# Patient Record
Sex: Female | Born: 1955 | Race: White | Hispanic: Refuse to answer | Marital: Married
Health system: Southern US, Community
[De-identification: ages and names within clinical notes are randomized; demographics above are authoritative.]

## PROBLEM LIST (undated history)

## (undated) DIAGNOSIS — D649 Anemia, unspecified: Secondary | ICD-10-CM

## (undated) DIAGNOSIS — E079 Disorder of thyroid, unspecified: Secondary | ICD-10-CM

## (undated) DIAGNOSIS — F32A Depression, unspecified: Secondary | ICD-10-CM

## (undated) DIAGNOSIS — K635 Polyp of colon: Secondary | ICD-10-CM

## (undated) DIAGNOSIS — G4733 Obstructive sleep apnea (adult) (pediatric): Secondary | ICD-10-CM

## (undated) DIAGNOSIS — G709 Myoneural disorder, unspecified: Secondary | ICD-10-CM

## (undated) DIAGNOSIS — S83209A Unspecified tear of unspecified meniscus, current injury, unspecified knee, initial encounter: Secondary | ICD-10-CM

## (undated) DIAGNOSIS — Z96659 Presence of unspecified artificial knee joint: Secondary | ICD-10-CM

## (undated) DIAGNOSIS — Z96651 Presence of right artificial knee joint: Secondary | ICD-10-CM

## (undated) DIAGNOSIS — K219 Gastro-esophageal reflux disease without esophagitis: Secondary | ICD-10-CM

## (undated) DIAGNOSIS — F329 Major depressive disorder, single episode, unspecified: Secondary | ICD-10-CM

## (undated) DIAGNOSIS — F419 Anxiety disorder, unspecified: Secondary | ICD-10-CM

## (undated) DIAGNOSIS — R0600 Dyspnea, unspecified: Secondary | ICD-10-CM

## (undated) DIAGNOSIS — Z87440 Personal history of urinary (tract) infections: Secondary | ICD-10-CM

## (undated) DIAGNOSIS — F1090 Alcohol use, unspecified, uncomplicated: Secondary | ICD-10-CM

## (undated) DIAGNOSIS — K769 Liver disease, unspecified: Secondary | ICD-10-CM

## (undated) DIAGNOSIS — R011 Cardiac murmur, unspecified: Secondary | ICD-10-CM

## (undated) DIAGNOSIS — I10 Essential (primary) hypertension: Secondary | ICD-10-CM

## (undated) DIAGNOSIS — E039 Hypothyroidism, unspecified: Secondary | ICD-10-CM

## (undated) DIAGNOSIS — M1990 Unspecified osteoarthritis, unspecified site: Secondary | ICD-10-CM

## (undated) DIAGNOSIS — R0602 Shortness of breath: Secondary | ICD-10-CM

## (undated) DIAGNOSIS — E785 Hyperlipidemia, unspecified: Secondary | ICD-10-CM

## (undated) DIAGNOSIS — Z7289 Other problems related to lifestyle: Secondary | ICD-10-CM

## (undated) DIAGNOSIS — E119 Type 2 diabetes mellitus without complications: Secondary | ICD-10-CM

## (undated) HISTORY — DX: Presence of right artificial knee joint: Z96.651

## (undated) HISTORY — DX: Depression, unspecified: F32.A

## (undated) HISTORY — DX: Other problems related to lifestyle: Z72.89

## (undated) HISTORY — DX: Hyperlipidemia, unspecified: E78.5

## (undated) HISTORY — DX: Unspecified tear of unspecified meniscus, current injury, unspecified knee, initial encounter: S83.209A

## (undated) HISTORY — DX: Liver disease, unspecified: K76.9

## (undated) HISTORY — DX: Disorder of thyroid, unspecified: E07.9

## (undated) HISTORY — DX: Type 2 diabetes mellitus without complications: E11.9

## (undated) HISTORY — PX: INNER EAR SURGERY: SHX679

## (undated) HISTORY — DX: Anemia, unspecified: D64.9

## (undated) HISTORY — PX: HAMMER TOE SURGERY: SHX385

## (undated) HISTORY — DX: Unspecified osteoarthritis, unspecified site: M19.90

## (undated) HISTORY — DX: Gastro-esophageal reflux disease without esophagitis: K21.9

## (undated) HISTORY — DX: Alcohol use, unspecified, uncomplicated: F10.90

## (undated) HISTORY — DX: Presence of unspecified artificial knee joint: Z96.659

## (undated) HISTORY — PX: CHOLECYSTECTOMY: SHX55

## (undated) HISTORY — DX: Major depressive disorder, single episode, unspecified: F32.9

## (undated) HISTORY — PX: ADENOIDECTOMY: SUR15

## (undated) HISTORY — DX: Personal history of urinary (tract) infections: Z87.440

## (undated) HISTORY — DX: Polyp of colon: K63.5

## (undated) HISTORY — PX: OTHER SURGICAL HISTORY: SHX169

## (undated) HISTORY — PX: TONSILLECTOMY: SUR1361

## (undated) HISTORY — DX: Obstructive sleep apnea (adult) (pediatric): G47.33

## (undated) HISTORY — DX: Essential (primary) hypertension: I10

## (undated) HISTORY — PX: BREAST SURGERY: SHX581

---

## 1985-02-21 HISTORY — PX: TUBAL LIGATION: SHX77

## 2007-02-22 HISTORY — PX: TOTAL KNEE ARTHROPLASTY: SHX125

## 2007-03-05 ENCOUNTER — Encounter: Payer: Self-pay | Admitting: Cardiovascular Disease

## 2007-04-24 ENCOUNTER — Encounter: Payer: Self-pay | Admitting: Cardiovascular Disease

## 2009-12-22 DIAGNOSIS — E119 Type 2 diabetes mellitus without complications: Secondary | ICD-10-CM

## 2009-12-22 HISTORY — DX: Type 2 diabetes mellitus without complications: E11.9

## 2009-12-22 LAB — HM PAP SMEAR

## 2010-01-05 ENCOUNTER — Encounter: Payer: Self-pay | Admitting: Cardiovascular Disease

## 2010-01-28 ENCOUNTER — Encounter: Payer: Self-pay | Admitting: Cardiovascular Disease

## 2010-04-06 ENCOUNTER — Encounter: Payer: Self-pay | Admitting: Gastroenterology

## 2010-04-12 ENCOUNTER — Other Ambulatory Visit: Payer: PRIVATE HEALTH INSURANCE

## 2010-04-12 ENCOUNTER — Encounter (INDEPENDENT_AMBULATORY_CARE_PROVIDER_SITE_OTHER): Payer: Self-pay | Admitting: *Deleted

## 2010-04-12 ENCOUNTER — Other Ambulatory Visit: Payer: Self-pay | Admitting: Gastroenterology

## 2010-04-12 ENCOUNTER — Ambulatory Visit (INDEPENDENT_AMBULATORY_CARE_PROVIDER_SITE_OTHER): Payer: PRIVATE HEALTH INSURANCE | Admitting: Gastroenterology

## 2010-04-12 ENCOUNTER — Encounter: Payer: Self-pay | Admitting: Gastroenterology

## 2010-04-12 DIAGNOSIS — F1021 Alcohol dependence, in remission: Secondary | ICD-10-CM | POA: Insufficient documentation

## 2010-04-12 DIAGNOSIS — R1013 Epigastric pain: Secondary | ICD-10-CM

## 2010-04-12 DIAGNOSIS — Z86010 Personal history of colonic polyps: Secondary | ICD-10-CM | POA: Insufficient documentation

## 2010-04-12 LAB — CBC WITH DIFFERENTIAL/PLATELET
Basophils Absolute: 0 10*3/uL (ref 0.0–0.1)
Basophils Relative: 0.4 % (ref 0.0–3.0)
Eosinophils Absolute: 0.1 10*3/uL (ref 0.0–0.7)
Eosinophils Relative: 0.9 % (ref 0.0–5.0)
HCT: 41.3 % (ref 36.0–46.0)
Hemoglobin: 14.1 g/dL (ref 12.0–15.0)
Lymphocytes Relative: 21 % (ref 12.0–46.0)
Lymphs Abs: 1.7 10*3/uL (ref 0.7–4.0)
MCHC: 34.3 g/dL (ref 30.0–36.0)
MCV: 103.3 fl — ABNORMAL HIGH (ref 78.0–100.0)
Monocytes Absolute: 0.4 10*3/uL (ref 0.1–1.0)
Monocytes Relative: 5 % (ref 3.0–12.0)
Neutro Abs: 6 10*3/uL (ref 1.4–7.7)
Neutrophils Relative %: 72.7 % (ref 43.0–77.0)
Platelets: 313 10*3/uL (ref 150.0–400.0)
RBC: 3.99 Mil/uL (ref 3.87–5.11)
RDW: 15.9 % — ABNORMAL HIGH (ref 11.5–14.6)
WBC: 8.2 10*3/uL (ref 4.5–10.5)

## 2010-04-12 LAB — COMPREHENSIVE METABOLIC PANEL
ALT: 45 U/L — ABNORMAL HIGH (ref 0–35)
AST: 68 U/L — ABNORMAL HIGH (ref 0–37)
Albumin: 3.6 g/dL (ref 3.5–5.2)
Alkaline Phosphatase: 86 U/L (ref 39–117)
BUN: 8 mg/dL (ref 6–23)
CO2: 31 mEq/L (ref 19–32)
Calcium: 9.9 mg/dL (ref 8.4–10.5)
Chloride: 100 mEq/L (ref 96–112)
Creatinine, Ser: 0.9 mg/dL (ref 0.4–1.2)
GFR: 70.73 mL/min (ref >60.00)
Glucose, Bld: 129 mg/dL — ABNORMAL HIGH (ref 70–99)
Potassium: 5.5 mEq/L — ABNORMAL HIGH (ref 3.5–5.1)
Sodium: 139 mEq/L (ref 135–145)
Total Bilirubin: 0.6 mg/dL (ref 0.3–1.2)
Total Protein: 6.8 g/dL (ref 6.0–8.3)

## 2010-04-12 LAB — PROTIME-INR
INR: 1.2 ratio — ABNORMAL HIGH (ref 0.8–1.0)
Prothrombin Time: 13 s — ABNORMAL HIGH (ref 10.2–12.4)

## 2010-04-16 ENCOUNTER — Telehealth: Payer: Self-pay | Admitting: Gastroenterology

## 2010-04-19 ENCOUNTER — Encounter (INDEPENDENT_AMBULATORY_CARE_PROVIDER_SITE_OTHER): Payer: Self-pay | Admitting: *Deleted

## 2010-04-19 ENCOUNTER — Encounter: Payer: Self-pay | Admitting: Gastroenterology

## 2010-04-20 ENCOUNTER — Ambulatory Visit (HOSPITAL_COMMUNITY)
Admission: RE | Admit: 2010-04-20 | Discharge: 2010-04-20 | Disposition: A | Discharge status: Home / Self Care | Payer: PRIVATE HEALTH INSURANCE | Source: Ambulatory Visit | Attending: Gastroenterology | Admitting: Gastroenterology

## 2010-04-20 DIAGNOSIS — K7689 Other specified diseases of liver: Secondary | ICD-10-CM | POA: Insufficient documentation

## 2010-04-20 DIAGNOSIS — Q638 Other specified congenital malformations of kidney: Secondary | ICD-10-CM | POA: Insufficient documentation

## 2010-04-20 DIAGNOSIS — R1013 Epigastric pain: Secondary | ICD-10-CM | POA: Insufficient documentation

## 2010-04-20 DIAGNOSIS — Z9089 Acquired absence of other organs: Secondary | ICD-10-CM | POA: Insufficient documentation

## 2010-04-20 NOTE — Letter (Signed)
Summary: Lenoard Aden MD/[facility]  Lenoard Aden MD/[facility]   Imported By: Lester [place] 04/15/2010 09:06:31  _____________________________________________________________________  External Attachment:    Type:   Image     Comment:   External Document

## 2010-04-20 NOTE — Assessment & Plan Note (Signed)
Summary: ABD PAIN..JJ. [facility] W PT//CX POL ADVISED//GI HX YEARS AGO IN [place]...   History of Present Illness Visit Type: Initial Visit Primary GI MD: Melvia Heaps MD [facility] Primary Provider: n/a Requesting Provider: Olivia Mackie, MD Chief Complaint: abdominal pain x 1 month History of Present Illness:   [name] [name]  is a 55 year old white female with history of alcoholism, colon polyps, diabetes and depression referred at the request of Dr. Billy Coast  for evaluation of abdominal pain.  For several months she has been complaining of sharp pain, both in the right and left lower quadrants. Pain may last minutes to up to 10 minutes at a time. It is unrelated to eating or moving her bowels. it is also not positional. There is no exacerbating or ameliorating factors. She also complains of midepigastric pain. This pain tends to be independent of eating.  She takes  Prevacid daily. She denies pyrosis , nausea or dysphagia. She has a history of alcohol abuse and has been in rehabilitation in the past. She admits to drinking 3 drinks daily.  She takes ibuprofen several times a week if she has back pain.   Recent vaginal ultrasound was normal.   GI Review of Systems    Reports abdominal pain, acid reflux, bloating, and  heartburn.     Location of  Abdominal pain: RLQ.    Denies belching, chest pain, dysphagia with liquids, dysphagia with solids, loss of appetite, nausea, vomiting, vomiting blood, weight loss, and  weight gain.        Denies anal fissure, black tarry stools, change in bowel habit, constipation, diarrhea, diverticulosis, fecal incontinence, heme positive stool, hemorrhoids, irritable bowel syndrome, jaundice, light color stool, liver problems, rectal bleeding, and  rectal pain. Preventive Screening-Counseling & Management  Alcohol-Tobacco     Smoking Status: quit  Caffeine-Diet-Exercise     Does Patient Exercise: no      Drug Use:  no.      Current Medications (verified): 1)  Prevacid 30  Mg Cpdr (Lansoprazole) .... Take 1 Cap By Mouth Once Daily 2)  Synthroid 125 Mcg Tabs (Levothyroxine Sodium) .... Take 1 Tablet By Mouth Once Daily 3)  Simvastatin 20 Mg Tabs (Simvastatin) .... Take 1 Tablet By Mouth Once Daily 4)  Citalopram Hydrobromide 20 Mg Tabs (Citalopram Hydrobromide) .... Take 1 Tablet By Mouth Once Daily 5)  Glipizide 5 Mg Tabs (Glipizide) .... Take 1 Tablet By Mouth Once Daily 6)  Lotrel 5-10 Mg Caps (Amlodipine Besy-Benazepril Hcl) .... Take 1 Cap By Mouth Once Daily 7)  Metformin Hcl 500 Mg Tabs (Metformin Hcl) .... Take 3 Tablets By Mouth At Bedtime 8)  Vitamin D3 2500iu .... Once Daily 9)  Vitamin B-1 100 Mg Tabs (Thiamine Hcl) .... Two Times A Day 10)  Ibuprofen 800 Mg Tabs (Ibuprofen) .... As Needed  Allergies (verified): 1)  ! Adhesive Tape  Past History:  Past Medical History: PTSD Alcoholism Anemia Arthritis Adenomatous Colon Polyps Depression Diabetes Gallstones Hyperlipidemia Hypertension Hypothyroidism Obesity Urinary Tract Infection  Past Surgical History: Cholecystectomy C- section Tonsillectomy Tubal Ligation Knee Arthroscopy Knee Replacement Ankle surgery  Family History: Patient adopted   Social History: Occupation: Retired Patient is a former smoker.  Alcohol Use - yes Daily Caffeine Use Illicit Drug Use - no Patient does not get regular exercise.  Smoking Status:  quit Drug Use:  no Does Patient Exercise:  no  Review of Systems       The patient complains of arthritis/joint pain, back pain, and urination changes/pain.  The patient denies anemia, anxiety-new, blood in urine, breast changes/lumps, change in vision, confusion, cough, coughing up blood, depression-new, fainting, fatigue, fever, headaches-new, hearing problems, heart murmur, heart rhythm changes, itching, menstrual pain, muscle pains/cramps, night sweats, nosebleeds, pregnancy symptoms, shortness of breath, skin rash, sleeping problems, sore throat,  swelling of feet/legs, swollen lymph glands, thirst - excessive, urination - excessive, urine leakage, vision changes, and voice change.         All other systems were reviewed and were negative   Vital Signs:  Patient profile:   55 year old female Height:      66 inches Weight:      245.25 pounds BMI:     39.73 Pulse rate:   96 / minute Pulse rhythm:   regular BP sitting:   130 / 70  (left arm) Cuff size:   regular  Vitals Entered By: June McMurray CMA Duncan Dull) (April 12, 2010 3:36 PM)  Physical Exam  Additional Exam:   On physical exam, she is an obese female.  Physical Exam: General:   WDWN HEENT:   anicteric.  No pharyngeal abnormalities Neck:   No masses, thyroidmegaly Nodes:   No cervical, axillary, inguinal adenopathy Chest:    Clear to auscultation Cardiac:   No murmurs, gallops, rubs Abdomen:   BS active.  No abd masses, tenderness;  Liver is palpable 3 finger breaths below the right costal margin and percusses to 12 cm Rectal:   Deferred Extremities:   No cyanosis, clubbing, edema Skeletal:   No deformities Neuro:   Alert, oriented x3.  No focal abnormalities     Impression & Recommendations:  Problem # 1:  EPIGASTRIC PAIN (ICD-789.06)  Pain could be due to ulcer or non ulcer dyspepsia.  Alcohol this may be an  irritating factor.  Recommendations  #1 followup hyomax 0.375 mg twice a day , while continuing  prevacid #2 upper endoscopy, if she's not improved with the addition of hyomax Orders: TLB-CBC Platelet - w/Differential (85025-CBCD) TLB-CMP (Comprehensive Metabolic Pnl) (80053-COMP) TLB-PT (Protime) (85610-PTP) Ultrasound Abdomen (UAS)  Problem # 2:  PERSONAL HISTORY OF ALCOHOLISM (ICD-V11.3)  The patient has hepatomegaly and I suspect alcohol related liver disease.   Recommendations #1 abdominal ultrasound. #2 check LFTs  Problem # 3:  PERSONAL HISTORY OF COLONIC POLYPS (ICD-V12.72)  Last colonoscopy in 2010 apparently was  negative.   Recommend followup colonoscopy in 2015. I will attempt to obtain her prior records  Problem # 4:  DM (ICD-250.00) Assessment: Comment Only  Patient Instructions: 1)  Copy sent to : Olivia Mackie, MD 2)  Your prescription(s) have been sent to you pharmacy.  3)  Please go to the basement today for your labs.  4)  Your abdominal ultrasound is scheduled for 04/20/2010, please follow the seperate instructions.  5)  The medication list was reviewed and reconciled.  All changed / newly prescribed medications were explained.  A complete medication list was provided to the patient / caregiver. Prescriptions: HYOMAX-SR 0.375 MG XR12H-TAB (HYOSCYAMINE SULFATE) takes one tab twice a day abdominal pain for 4 days, then as needed  #25 x 1   Entered and Authorized by:   Louis Meckel MD   Signed by:   Harlow Mares CMA (AAMA) on 04/12/2010   Method used:   Electronically to        [facility]  [phone]* (retail)       [address] [place], [place]  [address]  Ph: [phone] or [phone]       Fax: [phone]   RxID:   [id number]

## 2010-04-22 ENCOUNTER — Other Ambulatory Visit (AMBULATORY_SURGERY_CENTER): Payer: PRIVATE HEALTH INSURANCE | Admitting: Gastroenterology

## 2010-04-22 ENCOUNTER — Other Ambulatory Visit: Payer: Self-pay | Admitting: Gastroenterology

## 2010-04-22 ENCOUNTER — Encounter: Payer: Self-pay | Admitting: Gastroenterology

## 2010-04-22 DIAGNOSIS — K2960 Other gastritis without bleeding: Secondary | ICD-10-CM

## 2010-04-22 DIAGNOSIS — R109 Unspecified abdominal pain: Secondary | ICD-10-CM

## 2010-04-22 DIAGNOSIS — R1013 Epigastric pain: Secondary | ICD-10-CM

## 2010-04-22 DIAGNOSIS — D1330 Benign neoplasm of unspecified part of small intestine: Secondary | ICD-10-CM

## 2010-04-22 DIAGNOSIS — D131 Benign neoplasm of stomach: Secondary | ICD-10-CM

## 2010-04-22 LAB — GLUCOSE, CAPILLARY
Glucose-Capillary: 156 mg/dL — ABNORMAL HIGH (ref 70–99)
Glucose-Capillary: 148 mg/dL — ABNORMAL HIGH (ref 70–99)

## 2010-04-28 ENCOUNTER — Encounter: Payer: Self-pay | Admitting: Gastroenterology

## 2010-04-29 NOTE — Letter (Signed)
Summary: Diabetic Instructions  [facility]  [address] [place], [place] [address]   Phone: [phone]  Fax: [phone]    [name] [name] 1955/09/04 MRN: [id number]   Glipizide and Metformin  ORAL DIABETIC MEDICATION INSTRUCTIONS  The day before your procedure:   Take your diabetic pill as you do normally  The day of your procedure:   Do not take your diabetic pill    We will check your blood sugar levels during the admission process and again in Recovery before discharging you home  ________________________________________________________________________

## 2010-04-29 NOTE — Procedures (Addendum)
Summary: Upper Endoscopy  Patient: [name] [name] Note: All result statuses are Final unless otherwise noted.  Tests: (1) Upper Endoscopy (EGD)   EGD Upper Endoscopy       DONE (C)     [facility]     [address]     [place], [place]  [address]          ENDOSCOPY PROCEDURE REPORT          PATIENT:  [name] [name], [name] [name]  MR#:  #[id number]     BIRTHDATE:  05-Jan-1956, 54 yrs. old  GENDER:  female          ENDOSCOPIST:  Barbette Hair. Arlyce Dice, MD     Referred by:  Olivia Mackie, M.D.          PROCEDURE DATE:  04/22/2010     PROCEDURE:  EGD with biopsy, [address]     ASA CLASS:  Class II     INDICATIONS:  abdominal pain          MEDICATIONS:   Fentanyl 75 mcg IV, Versed 9 mg IV, Benadryl 50 mg     IV, glycopyrrolate (Robinal) 0.2 mg IV, 0.6cc simethancone 0.6 cc     PO     TOPICAL ANESTHETIC:  Exactacain Spray          DESCRIPTION OF PROCEDURE:   After the risks benefits and     alternatives of the procedure were thoroughly explained, informed     consent was obtained.  The LB GIF-H180 [id number] endoscope was     introduced through the mouth and advanced to the third portion of     the duodenum, without limitations.  The instrument was slowly     withdrawn as the mucosa was fully examined.     <<PROCEDUREIMAGES>>          A sessile polyp was found in the 2nd portion of the duodenum. It     was 3 mm in size. With standard forceps, a biopsy was obtained and     sent to pathology (see image1).  A sessile polyp was found in the     fundus. It was 4 mm in size. Hemorrhagic appearing With standard     forceps, a biopsy was obtained and sent to pathology (see image9).     There were multiple polyps identified. in the cardia (see image7).     Multiple nonbleeding 2-88mm polyps  Otherwise the examination was     normal (see image3, image5, image6, image11, and image12).     Retroflexed views revealed no abnormalities.    The scope was then     withdrawn from the patient and the  procedure completed.          COMPLICATIONS:  None          ENDOSCOPIC IMPRESSION:     1) 3 mm sessile polyp     2) 4 mm sessile polyp in the fundus     3) Polyps, multiple in the cardia     4) Otherwise normal examination          RECOMMENDATIONS:     1) continue current medications     2) Call office next 2-3 days to schedule an office appointment     for 1 month     3) MAC sedation for future procedures          REPEAT EXAM:  No          ______________________________     Molly Maduro  Rosalio Macadamia, MD          CC:          n.     REVISED:  04/22/2010 11:54 AM     eSIGNED:   Barbette Hair. Apolonia Ellwood at 04/22/2010 11:54 AM          [name], #[id number]  Note: An exclamation mark (!) indicates a result that was not dispersed into the flowsheet. Document Creation Date: 04/22/2010 11:54 AM _______________________________________________________________________  (1) Order result status: Final Collection or observation date-time: 04/22/2010 11:41 Requested date-time:  Receipt date-time:  Reported date-time:  Referring Physician:   Ordering Physician: Melvia Heaps [phone]) Specimen Source:  Source: Launa Grill Order Number: [id number] Lab site:

## 2010-04-29 NOTE — Letter (Signed)
Summary: Appt Reminder 2  [facility]  [address] [place], [place] [address]   Phone: [phone]  Fax: [phone]        April 22, 2010 MRN: [id number]    [facility] [address] [place], [place]  [address]    Dear Ms. Schicker,   You have a return appointment with Dr. Arlyce Dice on 06/02/10 at 9:15am.  Please remember to bring a complete list of the medicines you are taking, your insurance card and your co-pay.  If you have to cancel or reschedule this appointment, please call before 5:00 pm the evening before to avoid a cancellation fee.  If you have any questions or concerns, please call [phone].    Sincerely,    Selinda Michaels RN  Appended Document: Appt Reminder 2 Letter is mailed to the patient's home address

## 2010-04-29 NOTE — Letter (Signed)
Summary: EGD Instructions  [facility]  [address] [place], [place] [address]   Phone: [phone]  Fax: [phone]       AARION KITTRELL    [birth year]    MRN: [id number]       Procedure Day /Date:  Thursday 04/22/2010     Arrival Time: 10:00 am     Procedure Time: 11:00 am     Location of Procedure:                    _x  _ [facility] (4th Floor)    PREPARATION FOR ENDOSCOPY   On Thursday 3/1 THE DAY OF THE PROCEDURE:  1.   No solid foods, milk or milk products are allowed after midnight the night before your procedure.  2.   Do not drink anything colored red or purple.  Avoid juices with pulp.  No orange juice.  3.  You may drink clear liquids until 9:00 am, which is 2 hours before your procedure.                                                                                                CLEAR LIQUIDS INCLUDE: Water Jello Ice Popsicles Tea (sugar ok, no milk/cream) Powdered fruit flavored drinks Coffee (sugar ok, no milk/cream) Gatorade Juice: apple, white grape, white cranberry  Lemonade Clear bullion, consomm, broth Carbonated beverages (any kind) Strained chicken noodle soup Hard Candy   MEDICATION INSTRUCTIONS  Unless otherwise instructed, you should take regular prescription medications with a small sip of water as early as possible the morning of your procedure.  Diabetic patients - see separate instructions.              OTHER INSTRUCTIONS  You will need a responsible adult at least 56 years of age to accompany you and drive you home.   This person must remain in the waiting room during your procedure.  Wear loose fitting clothing that is easily removed.  Leave jewelry and other valuables at home.  However, you may wish to bring a book to read or an iPod/MP3 player to listen to music as you wait for your procedure to start.  Remove all body piercing jewelry and leave at home.  Total time from sign-in until  discharge is approximately 2-3 hours.  You should go home directly after your procedure and rest.  You can resume normal activities the day after your procedure.  The day of your procedure you should not:   Drive   Make legal decisions   Operate machinery   Drink alcohol   Return to work  You will receive specific instructions about eating, activities and medications before you leave.    The above instructions have been reviewed and explained to me by   Ezra Sites RN  April 19, 2010 4:26 PM    I fully understand and can verbalize these instructions _____________________________ Date _________

## 2010-04-29 NOTE — Miscellaneous (Signed)
Summary: [facility]  Clinical Lists Changes  Allergies: Changed allergy or adverse reaction from ADHESIVE TAPE to ADHESIVE TAPE

## 2010-04-29 NOTE — Progress Notes (Signed)
Summary: condt update  Phone Note Call from Patient Call back at Home Phone [phone]   Caller: Patient Call For: Dr Arlyce Dice Reason for Call: Talk to Nurse Summary of Call: Patient was seen on Monday and was told to call at the end of the week and let us know how she's feeling, pt states that the meds are helping but that now she's has nausea with and without food. Initial call taken by: Tawni Levy,  April 16, 2010 3:01 PM  Follow-up for Phone Call        Left message to call back Follow-up by: Selinda Michaels RN,  April 16, 2010 3:24 PM  Additional Follow-up for Phone Call Additional follow up Details #1::        Patient called and states that she is feeling some better, her sharp pains have stopped but she is still having cramping.States she is still having back pain on both sides. States that now she is having problems with nausea when she eats and when she doesn't eat. Let patient know that I would call her back on Monday.Dr. Arlyce Dice any recommendations? Additional Follow-up by: Selinda Michaels RN,  April 16, 2010 4:22 PM    Additional Follow-up for Phone Call Additional follow up Details #2::    take hyomax as needed pain and nausea schedule EGD Follow-up by: Louis Meckel MD,  April 19, 2010 9:06 AM  Additional Follow-up for Phone Call Additional follow up Details #3:: Details for Additional Follow-up Action Taken: Patient aware of Dr. Marzetta Board recommendations, she still has some Hyomax to use. Patient scheduled for Previsit today at 4pm and EGD 04/22/10@11am . Patient aware of appointment dates and times. Additional Follow-up by: Selinda Michaels RN,  April 19, 2010 9:20 AM

## 2010-05-04 NOTE — Letter (Signed)
Summary: Results Letter  [facility]  [address] [place], [place] [address]   Phone: [phone]  Fax: [phone]        April 28, 2010 MRN: [id number]    [facility] [address] [place], [place]  [address]    Dear Ms. Johannesen,  Your biopsy results did not show any remarkable findings.  Please continue with the recommendations previously discussed.  Should you have any further questions or immediate concers, feel free to contact me.  Sincerely,  Barbette Hair. Arlyce Dice, M.D., [facility]          Sincerely,  Louis Meckel MD  This letter has been electronically signed by your physician.  Appended Document: Results Letter letter mailed

## 2010-06-02 ENCOUNTER — Ambulatory Visit: Payer: PRIVATE HEALTH INSURANCE | Admitting: Gastroenterology

## 2010-06-23 ENCOUNTER — Encounter: Payer: Self-pay | Admitting: Internal Medicine

## 2010-06-23 ENCOUNTER — Ambulatory Visit (INDEPENDENT_AMBULATORY_CARE_PROVIDER_SITE_OTHER): Payer: No Typology Code available for payment source | Admitting: Internal Medicine

## 2010-06-23 VITALS — BP 144/82 | HR 80 | Temp 98.2°F | Resp 16 | Ht 66.75 in | Wt 240.0 lb

## 2010-06-23 DIAGNOSIS — J320 Chronic maxillary sinusitis: Secondary | ICD-10-CM

## 2010-06-23 DIAGNOSIS — F1010 Alcohol abuse, uncomplicated: Secondary | ICD-10-CM

## 2010-06-23 DIAGNOSIS — E039 Hypothyroidism, unspecified: Secondary | ICD-10-CM

## 2010-06-23 DIAGNOSIS — F1021 Alcohol dependence, in remission: Secondary | ICD-10-CM | POA: Insufficient documentation

## 2010-06-23 DIAGNOSIS — E119 Type 2 diabetes mellitus without complications: Secondary | ICD-10-CM | POA: Insufficient documentation

## 2010-06-23 DIAGNOSIS — E669 Obesity, unspecified: Secondary | ICD-10-CM

## 2010-06-23 DIAGNOSIS — I10 Essential (primary) hypertension: Secondary | ICD-10-CM | POA: Insufficient documentation

## 2010-06-23 DIAGNOSIS — E785 Hyperlipidemia, unspecified: Secondary | ICD-10-CM

## 2010-06-23 DIAGNOSIS — F329 Major depressive disorder, single episode, unspecified: Secondary | ICD-10-CM

## 2010-06-23 DIAGNOSIS — E1165 Type 2 diabetes mellitus with hyperglycemia: Secondary | ICD-10-CM

## 2010-06-23 DIAGNOSIS — E1159 Type 2 diabetes mellitus with other circulatory complications: Secondary | ICD-10-CM | POA: Insufficient documentation

## 2010-06-23 DIAGNOSIS — E1169 Type 2 diabetes mellitus with other specified complication: Secondary | ICD-10-CM

## 2010-06-23 LAB — BASIC METABOLIC PANEL
BUN: 12 mg/dL (ref 6–23)
CO2: 31 mEq/L (ref 19–32)
Calcium: 9.8 mg/dL (ref 8.4–10.5)
Chloride: 98 mEq/L (ref 96–112)
Creatinine, Ser: 0.8 mg/dL (ref 0.4–1.2)
GFR: 80.06 mL/min (ref >60.00)
Glucose, Bld: 110 mg/dL — ABNORMAL HIGH (ref 70–99)
Potassium: 4.7 mEq/L (ref 3.5–5.1)
Sodium: 138 mEq/L (ref 135–145)

## 2010-06-23 LAB — POCT URINALYSIS DIPSTICK
Blood, UA: NEGATIVE
Glucose, UA: NEGATIVE
Ketones, UA: NEGATIVE
Nitrite, UA: NEGATIVE
Protein, UA: NEGATIVE
Spec Grav, UA: 1.02
Urobilinogen, UA: 0.2
pH, UA: 5.5

## 2010-06-23 LAB — TSH: TSH: 6.47 u[IU]/mL — ABNORMAL HIGH (ref 0.35–5.50)

## 2010-06-23 LAB — LIPID PANEL
Cholesterol: 179 mg/dL (ref 0–200)
HDL: 47.8 mg/dL (ref >39.00)
LDL Cholesterol: 95 mg/dL (ref 0–99)
Total CHOL/HDL Ratio: 4
Triglycerides: 180 mg/dL — ABNORMAL HIGH (ref 0.0–149.0)
VLDL: 36 mg/dL (ref 0.0–40.0)

## 2010-06-23 MED ORDER — LEVOFLOXACIN 500 MG PO TABS: 500.0000 mg | ORAL_TABLET | Freq: Every day | ORAL | Status: DC

## 2010-06-23 MED ORDER — CITALOPRAM HYDROBROMIDE 40 MG PO TABS: 40.0000 mg | ORAL_TABLET | Freq: Every day | ORAL | Status: DC

## 2010-06-23 NOTE — Patient Instructions (Signed)
Our goal for the next 4 weeks to work on moderation of alcohol I want you to plan to do is to drink a glass of water in between every class of wine.  In trying to put a stop on four  glasses being your maximum  Ever A glass of 6 ounces. Going to increase the Celexa to 40 Can work on developing a list of the reasons why you feel you need to drink.

## 2010-06-24 LAB — HEMOGLOBIN A1C: Hgb A1c MFr Bld: 5.7 % (ref 4.6–6.5)

## 2010-07-01 ENCOUNTER — Encounter: Payer: Self-pay | Admitting: Internal Medicine

## 2010-07-13 ENCOUNTER — Ambulatory Visit (INDEPENDENT_AMBULATORY_CARE_PROVIDER_SITE_OTHER): Payer: PRIVATE HEALTH INSURANCE | Admitting: Gastroenterology

## 2010-07-13 ENCOUNTER — Encounter: Payer: Self-pay | Admitting: Gastroenterology

## 2010-07-13 VITALS — BP 122/72 | HR 80 | Ht 66.75 in | Wt 241.0 lb

## 2010-07-13 DIAGNOSIS — F1021 Alcohol dependence, in remission: Secondary | ICD-10-CM

## 2010-07-13 MED ORDER — HYOSCYAMINE SULFATE 0.125 MG PO TABS: 0.1250 mg | ORAL_TABLET | ORAL | Status: AC | PRN

## 2010-07-13 NOTE — Patient Instructions (Signed)
We will send your medication to your pharmacy today Follow up as needed

## 2010-07-13 NOTE — Assessment & Plan Note (Signed)
The patient was cautioned to decrease or stop alcohol intake and or ibuprofen if she develops recurrent abdominal pain.

## 2010-07-13 NOTE — Progress Notes (Signed)
History of Present Illness:  [name] [name] returns for followup of her upper abdominal pain. Pain is entirely gone. Upper endoscopy demonstrated benign hyperplastic gastric polyps and mild duodenitis. She took hyomax with improvement. She currently is entirely pain-free. She continues to drink and takes ibuprofen  regularly.  Abdominal ultrasound demonstrated fatty infiltration of the liver.    Review of Systems: Pertinent positive and negative review of systems were noted in the above HPI section. All other review of systems were otherwise negative.    Current Medications, Allergies, Past Medical History, Past Surgical History, Family History and Social History were reviewed in [employer] [facility] electronic medical record  Vital signs were reviewed in today's medical record. Physical Exam:

## 2010-07-21 ENCOUNTER — Encounter: Payer: Self-pay | Admitting: Internal Medicine

## 2010-07-21 ENCOUNTER — Ambulatory Visit (INDEPENDENT_AMBULATORY_CARE_PROVIDER_SITE_OTHER): Payer: Self-pay | Admitting: Internal Medicine

## 2010-07-21 VITALS — BP 130/80 | HR 76 | Temp 98.2°F | Resp 16 | Ht 66.75 in | Wt 238.0 lb

## 2010-07-21 DIAGNOSIS — I10 Essential (primary) hypertension: Secondary | ICD-10-CM

## 2010-07-21 DIAGNOSIS — E1169 Type 2 diabetes mellitus with other specified complication: Secondary | ICD-10-CM

## 2010-07-21 DIAGNOSIS — W57XXXA Bitten or stung by nonvenomous insect and other nonvenomous arthropods, initial encounter: Secondary | ICD-10-CM

## 2010-07-21 DIAGNOSIS — E1165 Type 2 diabetes mellitus with hyperglycemia: Secondary | ICD-10-CM

## 2010-07-21 DIAGNOSIS — E039 Hypothyroidism, unspecified: Secondary | ICD-10-CM | POA: Insufficient documentation

## 2010-07-21 DIAGNOSIS — IMO0001 Reserved for inherently not codable concepts without codable children: Secondary | ICD-10-CM

## 2010-07-21 MED ORDER — DOXYCYCLINE HYCLATE 100 MG PO TABS: 100.0000 mg | ORAL_TABLET | Freq: Two times a day (BID) | ORAL | Status: AC

## 2010-07-21 MED ORDER — LEVOTHYROXINE SODIUM 137 MCG PO TABS: 137.0000 ug | ORAL_TABLET | Freq: Every day | ORAL | Status: DC

## 2010-07-21 NOTE — Progress Notes (Signed)
Subjective:    Patient ID: [name] [name], female    DOB: 1955-08-15, 55 y.o.   MRN: [id number]  HPI  [place] presents for followup of diabetes hypothyroidism history of hyperlipidemia and hypertension.  We have also been discussing with patient alcohol use in moderation. She has lost weight since her last visit has been following a better diet for her diabetes as evidenced by her blood sugar hemoglobin A1c.  She has tried to moderate her alcohol but is still struggling with intermittent excessive use.  Blood pressure is under excellent control cholesterol the monitor prior to next visit  Review of Systems  Constitutional: Negative for activity change, appetite change and fatigue.  HENT: Negative for ear pain, congestion, neck pain, postnasal drip and sinus pressure.   Eyes: Negative for redness and visual disturbance.  Respiratory: Negative for cough, shortness of breath and wheezing.   Gastrointestinal: Negative for abdominal pain and abdominal distention.  Genitourinary: Negative for dysuria, frequency and menstrual problem.  Musculoskeletal: Negative for myalgias, joint swelling and arthralgias.  Skin: Negative for rash and wound.  Neurological: Negative for dizziness, weakness and headaches.  Hematological: Negative for adenopathy. Does not bruise/bleed easily.  Psychiatric/Behavioral: Negative for sleep disturbance and decreased concentration.   Past Medical History  Diagnosis Date  . Depression   . GERD (gastroesophageal reflux disease)   . Hypertension   . Hyperlipidemia   . Colon polyps   . Thyroid disease   . History of recurrent UTIs   . Anemia     menstrual related  . Arthritis     right ankle  . Diabetes mellitus 12/2009    type 2  . Alcohol problem drinking     rehab   Past Surgical History  Procedure Date  . Total knee arthroplasty 2009    right    reports that she quit smoking about 16 months ago. She does not have any smokeless tobacco history on  file. She reports that she drinks alcohol. She reports that she does not use illicit drugs. family history includes Alcohol abuse in her father and mother.  She is adopted. No Known Allergies     Objective:   Physical Exam  Constitutional: She is oriented to person, place, and time. She appears well-developed and well-nourished. No distress.  HENT:  Head: Normocephalic and atraumatic.  Right Ear: External ear normal.  Left Ear: External ear normal.  Nose: Nose normal.  Mouth/Throat: Oropharynx is clear and moist.  Eyes: Conjunctivae and EOM are normal. Pupils are equal, round, and reactive to light.  Neck: Normal range of motion. Neck supple. No JVD present. No tracheal deviation present. No thyromegaly present.  Cardiovascular: Normal rate, regular rhythm, normal heart sounds and intact distal pulses.   No murmur heard. Pulmonary/Chest: Effort normal and breath sounds normal. She has no wheezes. She exhibits no tenderness.  Abdominal: Soft. Bowel sounds are normal.  Musculoskeletal: Normal range of motion. She exhibits no edema and no tenderness.  Lymphadenopathy:    She has no cervical adenopathy.  Neurological: She is alert and oriented to person, place, and time. She has normal reflexes. No cranial nerve deficit.  Skin: Skin is warm and dry. She is not diaphoretic.  Psychiatric: She has a normal mood and affect. Her behavior is normal.          Assessment & Plan:  Patient's diabetes is better controlled with diet revision and weight loss.  Encouraged her to continue vigilant diet and continue weight loss program.  Still considered  obese with a BMI greater than 35  Her hypertension is stable on her current medications are thyroid is stable on current medications.  Counseled the patient about alcohol use in moderation .

## 2010-07-27 ENCOUNTER — Ambulatory Visit: Payer: No Typology Code available for payment source | Admitting: Internal Medicine

## 2010-07-27 NOTE — Progress Notes (Signed)
Subjective:    Patient ID: [name] [name], female    DOB: 01-Apr-1955, 55 y.o.   MRN: [id number]  HPI Patient is a 55 year old female who presents to establish primary care relationship.  Her current medications have been reviewed and placed in the chart she has a history of diabetes poorly controlled history of hypertension controlled history of hyperlipidemia and hypothyroidism.  Of concern in the review of systems was increased alcohol use which probably destabilizes both her hypertension and her diabetes.  She has had recent weight gain due to dietary noncompliance. She is retired as is moved to [place] area. The   Review of Systems  Constitutional: Negative for activity change, appetite change and fatigue.  HENT: Negative for ear pain, congestion, neck pain, postnasal drip and sinus pressure.   Eyes: Negative for redness and visual disturbance.  Respiratory: Negative for cough, shortness of breath and wheezing.   Gastrointestinal: Negative for abdominal pain and abdominal distention.  Genitourinary: Negative for dysuria, frequency and menstrual problem.  Musculoskeletal: Negative for myalgias, joint swelling and arthralgias.  Skin: Negative for rash and wound.  Neurological: Negative for dizziness, weakness and headaches.  Hematological: Negative for adenopathy. Does not bruise/bleed easily.  Psychiatric/Behavioral: Negative for sleep disturbance and decreased concentration.   Past Medical History  Diagnosis Date  . Depression   . GERD (gastroesophageal reflux disease)   . Hypertension   . Hyperlipidemia   . Colon polyps   . Thyroid disease   . History of recurrent UTIs   . Anemia     menstrual related  . Arthritis     right ankle  . Diabetes mellitus 12/2009    type 2  . Alcohol problem drinking     rehab   Past Surgical History  Procedure Date  . Total knee arthroplasty 2009    right    reports that she quit smoking about 17 months ago. She does not have any  smokeless tobacco history on file. She reports that she drinks alcohol. She reports that she does not use illicit drugs. family history includes Alcohol abuse in her father and mother.  She is adopted. No Known Allergies     Objective:   Physical Exam  Constitutional: She is oriented to person, place, and time. She appears well-developed and well-nourished. No distress.  HENT:  Head: Normocephalic and atraumatic.  Right Ear: External ear normal.  Left Ear: External ear normal.  Nose: Nose normal.  Mouth/Throat: Oropharynx is clear and moist.  Eyes: Conjunctivae and EOM are normal. Pupils are equal, round, and reactive to light.  Neck: Normal range of motion. Neck supple. No JVD present. No tracheal deviation present. No thyromegaly present.  Cardiovascular: Normal rate, regular rhythm, normal heart sounds and intact distal pulses.   No murmur heard. Pulmonary/Chest: Effort normal and breath sounds normal. She has no wheezes. She exhibits no tenderness.  Abdominal: Soft. Bowel sounds are normal.  Musculoskeletal: Normal range of motion. She exhibits no edema and no tenderness.  Lymphadenopathy:    She has no cervical adenopathy.  Neurological: She is alert and oriented to person, place, and time. She has normal reflexes. No cranial nerve deficit.  Skin: Skin is warm and dry. She is not diaphoretic.  Psychiatric: She has a normal mood and affect. Her behavior is normal.          Assessment & Plan:  This is a new patient presents to establish longitudinal care.  She has diabetes it appears to be poorly controlled I suspect  her A1c will be elevated.  We have reviewed the records brought from her previous physician we discussed the need for weight loss alcohol reduction and a fasting blood glucose between 100 and125. She requires appropriate screening laboratory values including a hemoglobin A1c measurement of her thyroid and her lipids.  We have set a goal of weight loss and  exercise and moderation of alcohol.  I spent more than 30 minutes face-to-face counseling with patient regarding these problems of hyperlipidemia hypertension diabetes and alcohol overuse

## 2010-10-26 ENCOUNTER — Telehealth: Payer: Self-pay | Admitting: Internal Medicine

## 2010-10-26 NOTE — Telephone Encounter (Signed)
Pt is having a UTI and is in [place] and is hopeing to get a prescription called in for her. Please contact.

## 2010-10-26 NOTE — Telephone Encounter (Signed)
Notified pt to go to Urgent Care as her Primary Care MD is not here.

## 2010-12-28 ENCOUNTER — Other Ambulatory Visit (INDEPENDENT_AMBULATORY_CARE_PROVIDER_SITE_OTHER): Payer: PRIVATE HEALTH INSURANCE

## 2010-12-28 DIAGNOSIS — IMO0002 Reserved for concepts with insufficient information to code with codable children: Secondary | ICD-10-CM

## 2010-12-28 DIAGNOSIS — F1011 Alcohol abuse, in remission: Secondary | ICD-10-CM

## 2010-12-28 DIAGNOSIS — E039 Hypothyroidism, unspecified: Secondary | ICD-10-CM

## 2010-12-28 DIAGNOSIS — E1165 Type 2 diabetes mellitus with hyperglycemia: Secondary | ICD-10-CM

## 2010-12-28 DIAGNOSIS — E1169 Type 2 diabetes mellitus with other specified complication: Secondary | ICD-10-CM

## 2010-12-28 LAB — LIPID PANEL
Cholesterol: 220 mg/dL — ABNORMAL HIGH (ref 0–200)
HDL: 57.8 mg/dL (ref >39.00)
Total CHOL/HDL Ratio: 4
Triglycerides: 258 mg/dL — ABNORMAL HIGH (ref 0.0–149.0)
VLDL: 51.6 mg/dL — ABNORMAL HIGH (ref 0.0–40.0)

## 2010-12-28 LAB — MICROALBUMIN / CREATININE URINE RATIO
Creatinine,U: 89.1 mg/dL
Microalb Creat Ratio: 4.6 mg/g (ref 0.0–30.0)
Microalb, Ur: 4.1 mg/dL — ABNORMAL HIGH (ref 0.0–1.9)

## 2010-12-28 LAB — LDL CHOLESTEROL, DIRECT: Direct LDL: 135.1 mg/dL

## 2010-12-28 LAB — TSH: TSH: 5.23 u[IU]/mL (ref 0.35–5.50)

## 2010-12-28 LAB — HEMOGLOBIN A1C: Hgb A1c MFr Bld: 5.6 % (ref 4.6–6.5)

## 2010-12-29 LAB — HEPATITIS C ANTIBODY: HCV Ab: NEGATIVE

## 2011-01-06 ENCOUNTER — Ambulatory Visit (INDEPENDENT_AMBULATORY_CARE_PROVIDER_SITE_OTHER): Payer: PRIVATE HEALTH INSURANCE | Admitting: Internal Medicine

## 2011-01-06 ENCOUNTER — Telehealth: Payer: Self-pay | Admitting: *Deleted

## 2011-01-06 ENCOUNTER — Encounter: Payer: Self-pay | Admitting: Internal Medicine

## 2011-01-06 VITALS — BP 140/70 | HR 76 | Temp 98.2°F | Resp 16 | Ht 66.75 in | Wt 244.0 lb

## 2011-01-06 DIAGNOSIS — E669 Obesity, unspecified: Secondary | ICD-10-CM

## 2011-01-06 DIAGNOSIS — F1021 Alcohol dependence, in remission: Secondary | ICD-10-CM

## 2011-01-06 DIAGNOSIS — E1165 Type 2 diabetes mellitus with hyperglycemia: Secondary | ICD-10-CM

## 2011-01-06 DIAGNOSIS — E785 Hyperlipidemia, unspecified: Secondary | ICD-10-CM

## 2011-01-06 DIAGNOSIS — Z23 Encounter for immunization: Secondary | ICD-10-CM

## 2011-01-06 DIAGNOSIS — E1169 Type 2 diabetes mellitus with other specified complication: Secondary | ICD-10-CM

## 2011-01-06 DIAGNOSIS — F329 Major depressive disorder, single episode, unspecified: Secondary | ICD-10-CM

## 2011-01-06 DIAGNOSIS — E039 Hypothyroidism, unspecified: Secondary | ICD-10-CM

## 2011-01-06 DIAGNOSIS — F1010 Alcohol abuse, uncomplicated: Secondary | ICD-10-CM

## 2011-01-06 DIAGNOSIS — I10 Essential (primary) hypertension: Secondary | ICD-10-CM

## 2011-01-06 MED ORDER — LANSOPRAZOLE 30 MG PO CPDR: 30.0000 mg | DELAYED_RELEASE_CAPSULE | Freq: Every day | ORAL | Status: DC

## 2011-01-06 MED ORDER — METFORMIN HCL 500 MG PO TABS: 500.0000 mg | ORAL_TABLET | Freq: Every day | ORAL | Status: DC

## 2011-01-06 MED ORDER — LEVOTHYROXINE SODIUM 137 MCG PO TABS: 137.0000 ug | ORAL_TABLET | Freq: Every day | ORAL | Status: DC

## 2011-01-06 MED ORDER — CITALOPRAM HYDROBROMIDE 40 MG PO TABS: 40.0000 mg | ORAL_TABLET | Freq: Every day | ORAL | Status: DC

## 2011-01-06 MED ORDER — AMLODIPINE BESY-BENAZEPRIL HCL 5-10 MG PO CAPS: 1.0000 | ORAL_CAPSULE | Freq: Every day | ORAL | Status: DC

## 2011-01-06 MED ORDER — GLIPIZIDE 5 MG PO TABS: 5.0000 mg | ORAL_TABLET | Freq: Every day | ORAL | Status: DC

## 2011-01-06 MED ORDER — EZETIMIBE 10 MG PO TABS: 10.0000 mg | ORAL_TABLET | Freq: Every day | ORAL | Status: DC

## 2011-01-06 MED ORDER — SIMVASTATIN 20 MG PO TABS: 20.0000 mg | ORAL_TABLET | Freq: Every day | ORAL | Status: DC

## 2011-01-06 MED ORDER — SIMVASTATIN 40 MG PO TABS: 40.0000 mg | ORAL_TABLET | Freq: Every evening | ORAL | Status: DC

## 2011-01-06 MED ORDER — CETIRIZINE HCL 10 MG PO TABS: 10.0000 mg | ORAL_TABLET | Freq: Every day | ORAL | Status: DC

## 2011-01-06 NOTE — Telephone Encounter (Signed)
a 

## 2011-01-06 NOTE — Patient Instructions (Addendum)
Need to exercise regularly Need loose weight Walk 15 min a day  You have received a steroid injection into a joint space. It will take up to 48 hours before you notice a difference in the pain in the joint. For the next few hours keep ice on the site of the injection. Do not exert the injected joint for the next 24 hours.

## 2011-01-06 NOTE — Progress Notes (Signed)
All meds were called to [facility] on [address]

## 2011-01-06 NOTE — Progress Notes (Signed)
Subjective:    Patient ID: [name] [name], female    DOB: 1955-10-15, 55 y.o.   MRN: [id number]  HPI Pt with extremely  poor insight into her use of ETOH and weight The metformin and glipizide have lowered  A1C to goal Blood pressure good Lipid control poor. This is due to diet and compliance issues while travelling.    Review of Systems  Constitutional: Negative for activity change, appetite change and fatigue.  HENT: Negative for ear pain, congestion, neck pain, postnasal drip and sinus pressure.   Eyes: Negative for redness and visual disturbance.  Respiratory: Negative for cough, shortness of breath and wheezing.   Gastrointestinal: Negative for abdominal pain and abdominal distention.  Genitourinary: Negative for dysuria, frequency and menstrual problem.  Musculoskeletal: Negative for myalgias, joint swelling and arthralgias.  Skin: Negative for rash and wound.  Neurological: Negative for dizziness, weakness and headaches.  Hematological: Negative for adenopathy. Does not bruise/bleed easily.  Psychiatric/Behavioral: Negative for sleep disturbance and decreased concentration.      Past Medical History  Diagnosis Date  . Depression   . GERD (gastroesophageal reflux disease)   . Hypertension   . Hyperlipidemia   . Colon polyps   . Thyroid disease   . History of recurrent UTIs   . Anemia     menstrual related  . Arthritis     right ankle  . Diabetes mellitus 12/2009    type 2  . Alcohol problem drinking     rehab    History   Social History  . Marital Status: Married    Spouse Name: N/A    Number of Children: N/A  . Years of Education: N/A   Occupational History  . Not on file.   Social History Main Topics  . Smoking status: Former Smoker    Quit date: 02/21/2009  . Smokeless tobacco: Not on file  . Alcohol Use: 0.0 oz/week    2-4 Glasses of wine per week  . Drug Use: No  . Sexually Active: Yes   Other Topics Concern  . Not on file   Social  History Narrative  . No narrative on file    Past Surgical History  Procedure Date  . Total knee arthroplasty 2009    right    Family History  Problem Relation Age of Onset  . Adopted: Yes  . COPD Mother   . Alcohol abuse Father     No Known Allergies  Current Outpatient Prescriptions on File Prior to Visit  Medication Sig Dispense Refill  . Cholecalciferol (VITAMIN D3) 2000 UNITS TABS Take by mouth. 2500iu daily       . DISCONTD: levothyroxine (SYNTHROID, LEVOTHROID) 137 MCG tablet Take 137 mcg by mouth daily.  90 tablet  3    BP 140/70  Pulse 76  Temp 98.2 F (36.8 C)  Resp 16  Ht 5' 6.75" (1.695 m)  Wt 244 lb (110.678 kg)  BMI 38.50 kg/m2    Objective:   Physical Exam  Nursing note and vitals reviewed. Constitutional: She is oriented to person, place, and time. She appears well-developed and well-nourished. No distress.  HENT:  Head: Normocephalic and atraumatic.  Right Ear: External ear normal.  Left Ear: External ear normal.  Nose: Nose normal.  Mouth/Throat: Oropharynx is clear and moist.  Eyes: Conjunctivae and EOM are normal. Pupils are equal, round, and reactive to light.  Neck: Normal range of motion. Neck supple. No JVD present. No tracheal deviation present. No thyromegaly present.  Cardiovascular:  Normal rate, regular rhythm, normal heart sounds and intact distal pulses.   No murmur heard. Pulmonary/Chest: Effort normal and breath sounds normal. She has no wheezes. She exhibits no tenderness.  Abdominal: Soft. Bowel sounds are normal.  Musculoskeletal: Normal range of motion. She exhibits no edema and no tenderness.  Lymphadenopathy:    She has no cervical adenopathy.  Neurological: She is alert and oriented to person, place, and time. She has normal reflexes. No cranial nerve deficit.  Skin: Skin is warm and dry. She is not diaphoretic.  Psychiatric: She has a normal mood and affect. Her behavior is normal.          Assessment & Plan:    reviewed diet and etoh use Discussed eating 4 meals "I have an excuse foreverything" She was seen in GI and the Hep c was    Informed consent obtained and the patient's knee was prepped with betadine. Local anesthesia was obtained with topical spray. Then 40 mg of Depo-Medrol and 1/2 cc of lidocaine was injected into the joint space. The patient tolerated the procedure without complications. Post injection care discussed with patient.

## 2011-01-11 ENCOUNTER — Telehealth: Payer: Self-pay | Admitting: Internal Medicine

## 2011-01-11 MED ORDER — METFORMIN HCL 500 MG PO TABS: ORAL_TABLET | ORAL | Status: DC

## 2011-01-11 NOTE — Telephone Encounter (Signed)
Metformin called in to pharmacy as 3 per day---please disregard second message- it was placed in error

## 2011-01-11 NOTE — Telephone Encounter (Signed)
There is still a problem with pts Metformin script according to [facility] [address]. Pt is suppose to take tid, but script says qd. Pls correct script to tid and send to [facility] on [address]

## 2011-01-12 ENCOUNTER — Telehealth: Payer: Self-pay | Admitting: Internal Medicine

## 2011-01-12 MED ORDER — METFORMIN HCL 500 MG PO TABS: ORAL_TABLET | ORAL | Status: DC

## 2011-01-12 MED ORDER — METFORMIN HCL 500 MG PO TABS: 500.0000 mg | ORAL_TABLET | Freq: Three times a day (TID) | ORAL | Status: DC

## 2011-01-12 NOTE — Telephone Encounter (Signed)
Called in.

## 2011-01-12 NOTE — Telephone Encounter (Signed)
Metformin was sent to incorrect pharmacy. Corrected script should have been sent to [facility] on [address]. Pls see previous phone note.

## 2011-04-12 ENCOUNTER — Telehealth: Payer: Self-pay | Admitting: Family Medicine

## 2011-04-12 NOTE — Telephone Encounter (Signed)
Pt.notified

## 2011-04-12 NOTE — Telephone Encounter (Signed)
nop- I looked at last ov and dr Lovell Sheehan did not say he wanted lab work

## 2011-04-12 NOTE — Telephone Encounter (Signed)
Pt has appt with Dr. Shela Commons on 2/25. Wants to know if she needs labs this week. Says Dr. Shela Commons usually orders them. Thanks!

## 2011-04-18 ENCOUNTER — Encounter: Payer: Self-pay | Admitting: Internal Medicine

## 2011-04-18 ENCOUNTER — Ambulatory Visit (INDEPENDENT_AMBULATORY_CARE_PROVIDER_SITE_OTHER): Payer: PRIVATE HEALTH INSURANCE | Admitting: Internal Medicine

## 2011-04-18 VITALS — BP 130/80 | HR 76 | Temp 98.1°F | Resp 16 | Ht 66.5 in | Wt 236.0 lb

## 2011-04-18 DIAGNOSIS — J329 Chronic sinusitis, unspecified: Secondary | ICD-10-CM

## 2011-04-18 DIAGNOSIS — E039 Hypothyroidism, unspecified: Secondary | ICD-10-CM

## 2011-04-18 DIAGNOSIS — A499 Bacterial infection, unspecified: Secondary | ICD-10-CM

## 2011-04-18 DIAGNOSIS — E785 Hyperlipidemia, unspecified: Secondary | ICD-10-CM

## 2011-04-18 DIAGNOSIS — B9689 Other specified bacterial agents as the cause of diseases classified elsewhere: Secondary | ICD-10-CM

## 2011-04-18 LAB — TSH: TSH: 1.83 u[IU]/mL (ref 0.35–5.50)

## 2011-04-18 LAB — HEPATIC FUNCTION PANEL
ALT: 46 U/L — ABNORMAL HIGH (ref 0–35)
AST: 90 U/L — ABNORMAL HIGH (ref 0–37)
Albumin: 4.1 g/dL (ref 3.5–5.2)
Alkaline Phosphatase: 91 U/L (ref 39–117)
Bilirubin, Direct: 0.2 mg/dL (ref 0.0–0.3)
Total Bilirubin: 0.9 mg/dL (ref 0.3–1.2)
Total Protein: 7.2 g/dL (ref 6.0–8.3)

## 2011-04-18 LAB — T3, FREE: T3, Free: 3 pg/mL (ref 2.3–4.2)

## 2011-04-18 LAB — T4, FREE: Free T4: 1.54 ng/dL (ref 0.60–1.60)

## 2011-04-18 LAB — HEMOGLOBIN A1C: Hgb A1c MFr Bld: 5.5 % (ref 4.6–6.5)

## 2011-04-18 MED ORDER — LEVOFLOXACIN 500 MG PO TABS: 500.0000 mg | ORAL_TABLET | Freq: Every day | ORAL | Status: AC

## 2011-04-18 NOTE — Patient Instructions (Signed)
The patient is instructed to continue all medications as prescribed. Schedule followup with check out clerk upon leaving the clinic  

## 2011-04-18 NOTE — Progress Notes (Signed)
Subjective:    Patient ID: [name] [name], female    DOB: Nov 16, 1955, 56 y.o.   MRN: [id number]  HPI Has been "sick" for 2- weeks. Has a sinus head ache.  Her other symptoms include a sore throat, cough and general sense of weakness.  She is followed for hypertension her blood pressure is stable she is also followed for her diabetes and she reports that she is not checking her glucoses. We will check a hemoglobin A1c today. Her last hemoglobin A1c was 5.6 which was within the normal range. She had arthroscopic surgery done to her left knee   Review of Systems  Constitutional: Negative for activity change, appetite change and fatigue.  HENT: Negative for ear pain, congestion, neck pain, postnasal drip and sinus pressure.   Eyes: Negative for redness and visual disturbance.  Respiratory: Negative for cough, shortness of breath and wheezing.   Gastrointestinal: Negative for abdominal pain and abdominal distention.  Genitourinary: Negative for dysuria, frequency and menstrual problem.  Musculoskeletal: Negative for myalgias, joint swelling and arthralgias.  Skin: Negative for rash and wound.  Neurological: Negative for dizziness, weakness and headaches.  Hematological: Negative for adenopathy. Does not bruise/bleed easily.  Psychiatric/Behavioral: Negative for sleep disturbance and decreased concentration.   Past Medical History  Diagnosis Date  . Depression   . GERD (gastroesophageal reflux disease)   . Hypertension   . Hyperlipidemia   . Colon polyps   . Thyroid disease   . History of recurrent UTIs   . Anemia     menstrual related  . Arthritis     right ankle  . Diabetes mellitus 12/2009    type 2  . Alcohol problem drinking     rehab  . Acute meniscal tear of knee     Left knee    History   Social History  . Marital Status: Married    Spouse Name: N/A    Number of Children: N/A  . Years of Education: N/A   Occupational History  . Not on file.   Social History  Main Topics  . Smoking status: Former Smoker    Quit date: 02/21/2009  . Smokeless tobacco: Not on file  . Alcohol Use: 0.0 oz/week    2-4 Glasses of wine per week  . Drug Use: No  . Sexually Active: Yes   Other Topics Concern  . Not on file   Social History Narrative  . No narrative on file    Past Surgical History  Procedure Date  . Total knee arthroplasty 2009    right  . Arthroscopy left knee 03/11/2011    Surgery was done in [place] for a meniscal tear    Family History  Problem Relation Age of Onset  . Adopted: Yes  . COPD Mother   . Alcohol abuse Father     No Known Allergies  Current Outpatient Prescriptions on File Prior to Visit  Medication Sig Dispense Refill  . amLODipine-benazepril (LOTREL) 5-10 MG per capsule Take 1 capsule by mouth daily.  90 capsule  3  . cetirizine (ZYRTEC) 10 MG tablet Take 1 tablet (10 mg total) by mouth daily.  90 tablet  3  . Cholecalciferol (VITAMIN D3) 2000 UNITS TABS Take by mouth. 2500iu daily       . citalopram (CELEXA) 40 MG tablet Take 1 tablet (40 mg total) by mouth daily.  90 tablet  3  . ezetimibe (ZETIA) 10 MG tablet Take 1 tablet (10 mg total) by mouth daily.  30  tablet  11  . glipiZIDE (GLUCOTROL) 5 MG tablet Take 1 tablet (5 mg total) by mouth daily.  90 tablet  3  . lansoprazole (PREVACID) 30 MG capsule Take 1 capsule (30 mg total) by mouth daily.  90 capsule  3  . levothyroxine (SYNTHROID, LEVOTHROID) 137 MCG tablet Take 1 tablet (137 mcg total) by mouth daily.  90 tablet  3  . metFORMIN (GLUCOPHAGE) 500 MG tablet Take 1 tablet (500 mg total) by mouth 3 (three) times daily.  270 tablet  3  . simvastatin (ZOCOR) 40 MG tablet Take 1 tablet (40 mg total) by mouth every evening.  30 tablet  11    BP 130/80  Pulse 76  Temp 98.1 F (36.7 C)  Resp 16  Ht 5' 6.5" (1.689 m)  Wt 236 lb (107.049 kg)  BMI 37.52 kg/m2         Objective:   Physical Exam  Nursing note reviewed. Constitutional: She is oriented to  person, place, and time. She appears well-developed and well-nourished. No distress.  HENT:  Head: Normocephalic and atraumatic.  Right Ear: External ear normal.  Left Ear: External ear normal.  Nose: Nose normal.  Mouth/Throat: Oropharynx is clear and moist.  Eyes: Conjunctivae and EOM are normal. Pupils are equal, round, and reactive to light.  Neck: Normal range of motion. Neck supple. No JVD present. No tracheal deviation present. No thyromegaly present.  Cardiovascular: Normal rate, regular rhythm, normal heart sounds and intact distal pulses.   No murmur heard. Pulmonary/Chest: Effort normal and breath sounds normal. She has no wheezes. She exhibits no tenderness.  Abdominal: Soft. Bowel sounds are normal.  Musculoskeletal: Normal range of motion. She exhibits no edema and no tenderness.  Lymphadenopathy:    She has no cervical adenopathy.  Neurological: She is alert and oriented to person, place, and time. She has normal reflexes. No cranial nerve deficit.  Skin: Skin is warm and dry. She is not diaphoretic.  Psychiatric: She has a normal mood and affect. Her behavior is normal.          Assessment & Plan:  acute sinusitis Levaquin 500 mg  for 7 days  Modify ETOH!  I have spent more than 30 minutes examining this patient face-to-face of which over half was spent in counseling. Monitor DM  Monitor thyroid for possible hypothyroidism,

## 2011-04-27 ENCOUNTER — Telehealth: Payer: Self-pay | Admitting: Family Medicine

## 2011-04-27 NOTE — Telephone Encounter (Signed)
Pt and husband have not heard back on labs done over 1 wk ago Please call ASAP. Thanks!

## 2011-04-27 NOTE — Telephone Encounter (Signed)
Pt informed-her lft were elvated and was told to cut back on wine per dr Lovell Sheehan- c/o sinus pressure after completing antibioitc- use saline nasal spray and mucinex

## 2011-05-16 ENCOUNTER — Ambulatory Visit (INDEPENDENT_AMBULATORY_CARE_PROVIDER_SITE_OTHER): Payer: Managed Care, Other (non HMO) | Admitting: Cardiovascular Disease

## 2011-05-16 ENCOUNTER — Encounter: Payer: Self-pay | Admitting: Cardiovascular Disease

## 2011-05-16 VITALS — BP 132/60 | HR 84 | Wt 244.4 lb

## 2011-05-16 DIAGNOSIS — E785 Hyperlipidemia, unspecified: Secondary | ICD-10-CM

## 2011-05-16 DIAGNOSIS — F1021 Alcohol dependence, in remission: Secondary | ICD-10-CM

## 2011-05-16 DIAGNOSIS — E039 Hypothyroidism, unspecified: Secondary | ICD-10-CM

## 2011-05-16 DIAGNOSIS — Z0000 Encounter for general adult medical examination without abnormal findings: Secondary | ICD-10-CM

## 2011-05-16 DIAGNOSIS — E119 Type 2 diabetes mellitus without complications: Secondary | ICD-10-CM

## 2011-05-16 DIAGNOSIS — I10 Essential (primary) hypertension: Secondary | ICD-10-CM

## 2011-05-16 NOTE — Assessment & Plan Note (Signed)
On synthroid replacement.  TSH q 6 months.

## 2011-05-16 NOTE — Assessment & Plan Note (Signed)
Well controlled.  Continue current medications and low sodium Dash type diet.    

## 2011-05-16 NOTE — Progress Notes (Signed)
Patient ID: [name] [name], female   DOB: 1955-03-27, 56 y.o.   MRN: [id number] 56 yo self referred with husband.  Recently moved from [facility].  Reviewed notes from Dr Ardis Hughs.    Had normal myovue 01/28/10 and normal echo 01/28/10 EF 60% trace MR.  Had atypical chest pain. Has HTN, elevated lipids and DM not on insulin  ROS: Denies fever, malais, weight loss, blurry vision, decreased visual acuity, cough, sputum, SOB, hemoptysis, pleuritic pain, palpitaitons, heartburn, abdominal pain, melena, lower extremity edema, claudication, or rash.  All other systems reviewed and negative   General: Affect appropriate Healthy:  appears stated age HEENT: normal Neck supple with no adenopathy JVP normal no bruits no thyromegaly Lungs clear with no wheezing and good diaphragmatic motion Heart:  S1/S2 SEM no ,rub, gallop or click PMI normal Abdomen: benighn, BS positve, no tenderness, no AAA no bruit.  No HSM or HJR Distal pulses intact with no bruits No edema Neuro non-focal Skin warm and dry No muscular weakness  Medications Current Outpatient Prescriptions  Medication Sig Dispense Refill  . amLODipine-benazepril (LOTREL) 5-10 MG per capsule Take 1 capsule by mouth daily.  90 capsule  3  . ascorbic acid (VITAMIN C) 500 MG tablet Take 500 mg by mouth 2 (two) times daily.      . cetirizine (ZYRTEC) 10 MG tablet Take 1 tablet (10 mg total) by mouth daily.  90 tablet  3  . Cholecalciferol (VITAMIN D3) 2000 UNITS TABS Take by mouth. 2500iu daily       . citalopram (CELEXA) 40 MG tablet Take 1 tablet (40 mg total) by mouth daily.  90 tablet  3  . ezetimibe (ZETIA) 10 MG tablet Take 1 tablet (10 mg total) by mouth daily.  30 tablet  11  . glipiZIDE (GLUCOTROL) 5 MG tablet Take 1 tablet (5 mg total) by mouth daily.  90 tablet  3  . lansoprazole (PREVACID) 30 MG capsule Take 1 capsule (30 mg total) by mouth daily.  90 capsule  3  . levothyroxine (SYNTHROID, LEVOTHROID) 137 MCG tablet Take 1 tablet  (137 mcg total) by mouth daily.  90 tablet  3  . metFORMIN (GLUCOPHAGE) 500 MG tablet Take 500 mg by mouth. 3 TABS  EVERY DAY      . simvastatin (ZOCOR) 40 MG tablet Take 1 tablet (40 mg total) by mouth every evening.  30 tablet  11    Allergies Review of patient's allergies indicates no known allergies.  Family History: Family History  Problem Relation Age of Onset  . Adopted: Yes  . COPD Mother   . Alcohol abuse Father     Social History: History   Social History  . Marital Status: Married    Spouse Name: N/A    Number of Children: N/A  . Years of Education: N/A   Occupational History  . Not on file.   Social History Main Topics  . Smoking status: Former Smoker    Quit date: 02/21/2009  . Smokeless tobacco: Not on file  . Alcohol Use: 0.0 oz/week    2-4 Glasses of wine per week  . Drug Use: No  . Sexually Active: Yes   Other Topics Concern  . Not on file   Social History Narrative  . No narrative on file    Electrocardiogram:  NSR rate 84 normal ECG  Assessment and Plan

## 2011-05-16 NOTE — Patient Instructions (Signed)
Your physician recommends that you schedule a follow-up appointment in: AS NEEDED  Your physician recommends that you continue on your current medications as directed. Please refer to the Current Medication list given to you today.  

## 2011-05-16 NOTE — Assessment & Plan Note (Signed)
Discussed low carb diet.  Target hemoglobin A1c is 6.5 or less.  Continue current medications.  

## 2011-05-16 NOTE — Assessment & Plan Note (Signed)
Discussed at length.  Had 30 day inpatient stay at [facility] 5 years ago.  Not motivated to quit.  History of elevated LFTs and GERD but no varices.  Long term issues regarding blood counts, liver damage discussed.   F/U Dr Rexene Edison

## 2011-05-16 NOTE — Assessment & Plan Note (Signed)
Cholesterol is at goal.  Continue current dose of statin and diet Rx.  No myalgias or side effects.  F/U  LFT's in 6 months. Lab Results  Component Value Date   LDLCALC 95 06/23/2010

## 2011-07-07 ENCOUNTER — Ambulatory Visit (INDEPENDENT_AMBULATORY_CARE_PROVIDER_SITE_OTHER): Payer: Managed Care, Other (non HMO) | Admitting: Internal Medicine

## 2011-07-07 ENCOUNTER — Encounter: Payer: Self-pay | Admitting: Internal Medicine

## 2011-07-07 VITALS — BP 116/72 | HR 76 | Temp 98.2°F | Resp 16 | Ht 66.5 in | Wt 238.0 lb

## 2011-07-07 DIAGNOSIS — J322 Chronic ethmoidal sinusitis: Secondary | ICD-10-CM

## 2011-07-07 DIAGNOSIS — E119 Type 2 diabetes mellitus without complications: Secondary | ICD-10-CM

## 2011-07-07 DIAGNOSIS — E669 Obesity, unspecified: Secondary | ICD-10-CM

## 2011-07-07 DIAGNOSIS — I10 Essential (primary) hypertension: Secondary | ICD-10-CM

## 2011-07-07 DIAGNOSIS — F1010 Alcohol abuse, uncomplicated: Secondary | ICD-10-CM

## 2011-07-07 LAB — HEMOGLOBIN A1C: Hgb A1c MFr Bld: 5.3 % (ref 4.6–6.5)

## 2011-07-07 MED ORDER — AZELASTINE-FLUTICASONE 137-50 MCG/ACT NA SUSP: 1.0000 | Freq: Two times a day (BID) | NASAL | Status: DC

## 2011-07-07 NOTE — Progress Notes (Signed)
Subjective:    Patient ID: [name] [name], female    DOB: 17-Jan-1956, 56 y.o.   MRN: [id number]  HPI  Patient is a 56 year old female who is followed for hyperlipidemia hypertension adult-onset diabetes and GERD.  The patient has been doing well she has lost approximately 10 pounds her blood pressure is in excellent control she is tolerating her medications she states that she has done better with her alcohol use going as many as 2-3 days during the week without drinking. Since he does not follow a special diet I think this is the primary reason why she has lost weight she's been to cardiologist and been evaluated for blood pressure.  She stable her current medications She has persistent irritation of her sinuses without overt sinus infection  Review of Systems  Constitutional: Negative for activity change, appetite change and fatigue.  HENT: Negative for ear pain, congestion, neck pain, postnasal drip and sinus pressure.   Eyes: Negative for redness and visual disturbance.  Respiratory: Negative for cough, shortness of breath and wheezing.   Gastrointestinal: Negative for abdominal pain and abdominal distention.  Genitourinary: Negative for dysuria, frequency and menstrual problem.  Musculoskeletal: Negative for myalgias, joint swelling and arthralgias.  Skin: Negative for rash and wound.  Neurological: Negative for dizziness, weakness and headaches.  Hematological: Negative for adenopathy. Does not bruise/bleed easily.  Psychiatric/Behavioral: Negative for sleep disturbance and decreased concentration.   Past Medical History  Diagnosis Date  . Depression   . GERD (gastroesophageal reflux disease)   . Hypertension   . Hyperlipidemia   . Colon polyps   . Thyroid disease   . History of recurrent UTIs   . Anemia     menstrual related  . Arthritis     right ankle  . Diabetes mellitus 12/2009    type 2  . Alcohol problem drinking     rehab  . Acute meniscal tear of knee    Left knee    History   Social History  . Marital Status: Married    Spouse Name: N/A    Number of Children: N/A  . Years of Education: N/A   Occupational History  . Not on file.   Social History Main Topics  . Smoking status: Former Smoker    Quit date: 02/21/2009  . Smokeless tobacco: Not on file  . Alcohol Use: 0.0 oz/week    2-4 Glasses of wine per week  . Drug Use: No  . Sexually Active: Yes   Other Topics Concern  . Not on file   Social History Narrative  . No narrative on file    Past Surgical History  Procedure Date  . Total knee arthroplasty 2009    right  . Arthroscopy left knee 03/11/2011    Surgery was done in [place] for a meniscal tear    Family History  Problem Relation Age of Onset  . Adopted: Yes  . COPD Mother   . Alcohol abuse Father     No Known Allergies  Current Outpatient Prescriptions on File Prior to Visit  Medication Sig Dispense Refill  . amLODipine-benazepril (LOTREL) 5-10 MG per capsule Take 1 capsule by mouth daily.  90 capsule  3  . cetirizine (ZYRTEC) 10 MG tablet Take 1 tablet (10 mg total) by mouth daily.  90 tablet  3  . Cholecalciferol (VITAMIN D3) 2000 UNITS TABS Take by mouth. 2500iu daily       . citalopram (CELEXA) 40 MG tablet Take 1 tablet (40 mg total)  by mouth daily.  90 tablet  3  . ezetimibe (ZETIA) 10 MG tablet Take 1 tablet (10 mg total) by mouth daily.  30 tablet  11  . glipiZIDE (GLUCOTROL) 5 MG tablet Take 1 tablet (5 mg total) by mouth daily.  90 tablet  3  . lansoprazole (PREVACID) 30 MG capsule Take 1 capsule (30 mg total) by mouth daily.  90 capsule  3  . levothyroxine (SYNTHROID, LEVOTHROID) 137 MCG tablet Take 1 tablet (137 mcg total) by mouth daily.  90 tablet  3  . metFORMIN (GLUCOPHAGE) 500 MG tablet Take 500 mg by mouth. 3 TABS  EVERY DAY      . simvastatin (ZOCOR) 40 MG tablet Take 1 tablet (40 mg total) by mouth every evening.  30 tablet  11  . Azelastine-Fluticasone (DYMISTA) 137-50 MCG/ACT SUSP  Place 1 spray into the nose 2 (two) times daily.  23 g  11    BP 116/72  Pulse 76  Temp 98.2 F (36.8 C)  Resp 16  Ht 5' 6.5" (1.689 m)  Wt 238 lb (107.956 kg)  BMI 37.84 kg/m2       Objective:   Physical Exam  Nursing note and vitals reviewed. Constitutional: She is oriented to person, place, and time. She appears well-developed and well-nourished. No distress.  HENT:  Head: Normocephalic and atraumatic.  Right Ear: External ear normal.  Left Ear: External ear normal.  Nose: Nose normal.  Mouth/Throat: Oropharynx is clear and moist.  Eyes: Conjunctivae and EOM are normal. Pupils are equal, round, and reactive to light.  Neck: Normal range of motion. Neck supple. No JVD present. No tracheal deviation present. No thyromegaly present.  Cardiovascular: Normal rate, regular rhythm, normal heart sounds and intact distal pulses.   No murmur heard. Pulmonary/Chest: Effort normal and breath sounds normal. She has no wheezes. She exhibits no tenderness.  Abdominal: Soft. Bowel sounds are normal.  Musculoskeletal: Normal range of motion. She exhibits no edema and no tenderness.  Lymphadenopathy:    She has no cervical adenopathy.  Neurological: She is alert and oriented to person, place, and time. She has normal reflexes. No cranial nerve deficit.  Skin: Skin is warm and dry. She is not diaphoretic.  Psychiatric: She has a normal mood and affect. Her behavior is normal.          Assessment & Plan:  Patient has lost weight I believe her A1c will be improved we'll do an A1c today to monitor diabetes she states her CBGs have improved.  Blood pressure stable on current medication continue current medication continue weight loss program and modify alcohol as she is.absence would of course be my first choice but significant reduction in her alcohol consumption would be wise.  She has persistent allergic rhinitis and chronic sinusitis we'll begin a trial of a combination of Astelin and  I steroid nasal spray

## 2011-07-07 NOTE — Patient Instructions (Addendum)
paleo "practical paleo"   Black kohush for flashes  If the sinuses do not clear, call in 7 days for antibiotic

## 2011-07-11 ENCOUNTER — Other Ambulatory Visit: Payer: Self-pay | Admitting: Internal Medicine

## 2011-07-19 ENCOUNTER — Telehealth: Payer: Self-pay | Admitting: Internal Medicine

## 2011-07-19 MED ORDER — CEFUROXIME AXETIL 250 MG PO TABS: 250.0000 mg | ORAL_TABLET | Freq: Two times a day (BID) | ORAL | Status: AC

## 2011-07-19 NOTE — Telephone Encounter (Signed)
Pt stated nasal spray did not work. Pt said Dr Lovell Sheehan said he would prescribed abx if NS failed. [facility] [address]

## 2011-07-19 NOTE — Telephone Encounter (Signed)
Per dr jenkins-0 may have ceftin 250 bid for 10 days-pt informed

## 2011-07-28 ENCOUNTER — Telehealth: Payer: Self-pay | Admitting: Internal Medicine

## 2011-07-28 MED ORDER — CEFUROXIME AXETIL 250 MG PO TABS: 250.0000 mg | ORAL_TABLET | Freq: Two times a day (BID) | ORAL | Status: DC

## 2011-07-28 MED ORDER — LEVOFLOXACIN 500 MG PO TABS: 500.0000 mg | ORAL_TABLET | Freq: Every day | ORAL | Status: AC

## 2011-07-28 NOTE — Telephone Encounter (Signed)
Pt states ceftin didn't really help as much as levquin-per dr Lovell Sheehan- give levaquin 500 for 14 days

## 2011-07-28 NOTE — Telephone Encounter (Signed)
Per dr Lovell Sheehan- try ceftin 250 bid for 14 days-pt notified

## 2011-07-28 NOTE — Telephone Encounter (Signed)
Pt called and said that she will be taking the last of abx today and the sinus infection is not any better. This is pts 2nd round of abx and has been on nose spray. Pt says that she is still having really bad sinus headaches. [facility] on [address].

## 2011-09-12 ENCOUNTER — Encounter: Payer: Self-pay | Admitting: Internal Medicine

## 2011-10-07 ENCOUNTER — Ambulatory Visit (INDEPENDENT_AMBULATORY_CARE_PROVIDER_SITE_OTHER): Payer: Managed Care, Other (non HMO) | Admitting: Internal Medicine

## 2011-10-07 ENCOUNTER — Encounter: Payer: Self-pay | Admitting: Internal Medicine

## 2011-10-07 ENCOUNTER — Other Ambulatory Visit (HOSPITAL_COMMUNITY)
Admission: RE | Admit: 2011-10-07 | Discharge: 2011-10-07 | Disposition: A | Discharge status: Home / Self Care | Payer: Managed Care, Other (non HMO) | Source: Ambulatory Visit | Attending: Internal Medicine | Admitting: Internal Medicine

## 2011-10-07 VITALS — BP 130/76 | HR 80 | Temp 98.6°F | Resp 16 | Ht 67.0 in | Wt 224.0 lb

## 2011-10-07 DIAGNOSIS — Z01419 Encounter for gynecological examination (general) (routine) without abnormal findings: Secondary | ICD-10-CM | POA: Insufficient documentation

## 2011-10-07 DIAGNOSIS — IMO0001 Reserved for inherently not codable concepts without codable children: Secondary | ICD-10-CM

## 2011-10-07 DIAGNOSIS — T887XXA Unspecified adverse effect of drug or medicament, initial encounter: Secondary | ICD-10-CM

## 2011-10-07 DIAGNOSIS — Z1239 Encounter for other screening for malignant neoplasm of breast: Secondary | ICD-10-CM

## 2011-10-07 DIAGNOSIS — Z0000 Encounter for general adult medical examination without abnormal findings: Secondary | ICD-10-CM

## 2011-10-07 LAB — HEPATIC FUNCTION PANEL
ALT: 43 U/L — ABNORMAL HIGH (ref 0–35)
AST: 87 U/L — ABNORMAL HIGH (ref 0–37)
Albumin: 3.9 g/dL (ref 3.5–5.2)
Alkaline Phosphatase: 71 U/L (ref 39–117)
Bilirubin, Direct: 0.3 mg/dL (ref 0.0–0.3)
Total Bilirubin: 1.1 mg/dL (ref 0.3–1.2)
Total Protein: 7.4 g/dL (ref 6.0–8.3)

## 2011-10-07 LAB — HEMOGLOBIN A1C: Hgb A1c MFr Bld: 5.5 % (ref 4.6–6.5)

## 2011-10-07 NOTE — Addendum Note (Signed)
Addended by: Willy Eddy on: 10/07/2011 01:45 PM   Modules accepted: Orders

## 2011-10-07 NOTE — Progress Notes (Signed)
Subjective:    Patient ID: [name] [name], female    DOB: 03/28/1955, 56 y.o.   MRN: [id number]  HPI For Papa and pelvic Moderate reduction of ETOH Weight loss of approximately 20 pounds.  She states her blood sugars have been better control she presents today for a woman's wellness examination Pap and breast examination as well as monitoring of a hemoglobin A1c for diabetic control and blood pressure check and monitoring of liver functions for alcohol reduction   Review of Systems  Constitutional: Negative for activity change, appetite change and fatigue.  HENT: Negative for ear pain, congestion, neck pain, postnasal drip and sinus pressure.   Eyes: Negative for redness and visual disturbance.  Respiratory: Negative for cough, shortness of breath and wheezing.   Gastrointestinal: Negative for abdominal pain and abdominal distention.  Genitourinary: Negative for dysuria, frequency and menstrual problem.  Musculoskeletal: Negative for myalgias, joint swelling and arthralgias.  Skin: Negative for rash and wound.  Neurological: Negative for dizziness, weakness and headaches.  Hematological: Negative for adenopathy. Does not bruise/bleed easily.  Psychiatric/Behavioral: Negative for disturbed wake/sleep cycle and decreased concentration.   . Past Medical History  Diagnosis Date  . Depression   . GERD (gastroesophageal reflux disease)   . Hypertension   . Hyperlipidemia   . Colon polyps   . Thyroid disease   . History of recurrent UTIs   . Anemia     menstrual related  . Arthritis     right ankle  . Diabetes mellitus 12/2009    type 2  . Alcohol problem drinking     rehab  . Acute meniscal tear of knee     Left knee    History   Social History  . Marital Status: Married    Spouse Name: N/A    Number of Children: N/A  . Years of Education: N/A   Occupational History  . Not on file.   Social History Main Topics  . Smoking status: Former Smoker    Quit date:  02/21/2009  . Smokeless tobacco: Not on file  . Alcohol Use: 0.0 oz/week    2-4 Glasses of wine per week  . Drug Use: No  . Sexually Active: Yes   Other Topics Concern  . Not on file   Social History Narrative  . No narrative on file    Past Surgical History  Procedure Date  . Total knee arthroplasty 2009    right  . Arthroscopy left knee 03/11/2011    Surgery was done in [place] for a meniscal tear    Family History  Problem Relation Age of Onset  . Adopted: Yes  . COPD Mother   . Alcohol abuse Father     No Known Allergies  Current Outpatient Prescriptions on File Prior to Visit  Medication Sig Dispense Refill  . amLODipine-benazepril (LOTREL) 5-10 MG per capsule Take 1 capsule by mouth daily.  90 capsule  3  . B Complex Vitamins (B COMPLEX 100 PO) Take 1 tablet by mouth 2 (two) times daily.      . cetirizine (ZYRTEC) 10 MG tablet Take 1 tablet (10 mg total) by mouth daily.  90 tablet  3  . Cholecalciferol (VITAMIN D3) 2000 UNITS TABS Take by mouth. 2500iu daily       . citalopram (CELEXA) 40 MG tablet Take 1 tablet (40 mg total) by mouth daily.  90 tablet  3  . ezetimibe (ZETIA) 10 MG tablet Take 1 tablet (10 mg total) by mouth daily.  30 tablet  11  . glipiZIDE (GLUCOTROL) 5 MG tablet Take 1 tablet (5 mg total) by mouth daily.  90 tablet  3  . lansoprazole (PREVACID) 30 MG capsule Take 1 capsule (30 mg total) by mouth daily.  90 capsule  3  . levothyroxine (SYNTHROID, LEVOTHROID) 137 MCG tablet Take 1 tablet (137 mcg total) by mouth daily.  90 tablet  3  . metFORMIN (GLUCOPHAGE) 500 MG tablet Take 500 mg by mouth. 3 TABS  EVERY DAY      . simvastatin (ZOCOR) 40 MG tablet Take 1 tablet (40 mg total) by mouth every evening.  30 tablet  11    BP 130/76  Pulse 80  Temp 98.6 F (37 C)  Resp 16  Ht 5\' 7"  (1.702 m)  Wt 224 lb (101.606 kg)  BMI 35.08 kg/m2       Objective:   Physical Exam  Nursing note and vitals reviewed. Constitutional: She is oriented to  person, place, and time. She appears well-developed and well-nourished. No distress.  HENT:  Head: Normocephalic and atraumatic.  Right Ear: External ear normal.  Left Ear: External ear normal.  Nose: Nose normal.  Mouth/Throat: Oropharynx is clear and moist.  Eyes: Conjunctivae and EOM are normal. Pupils are equal, round, and reactive to light.  Neck: Normal range of motion. Neck supple. No JVD present. No tracheal deviation present. No thyromegaly present.  Cardiovascular: Normal rate, regular rhythm, normal heart sounds and intact distal pulses.   No murmur heard. Pulmonary/Chest: Effort normal and breath sounds normal. She has no wheezes. She exhibits no tenderness.       No breast lumps  Abdominal: Soft. Bowel sounds are normal.  Musculoskeletal: Normal range of motion. She exhibits no edema and no tenderness.  Lymphadenopathy:    She has no cervical adenopathy.  Neurological: She is alert and oriented to person, place, and time. She has normal reflexes. No cranial nerve deficit.  Skin: Skin is warm and dry. She is not diaphoretic.  Psychiatric: She has a normal mood and affect. Her behavior is normal.          Assessment & Plan:  Pap and pelvic examination done.  Breast examination showed no focal lumps.  A1c will be drawn for monitoring of diabetic control.  Liver functions will be drawn to monitor liver abnormalities in the setting of alcohol reduction and weight loss

## 2011-11-29 ENCOUNTER — Encounter: Payer: Self-pay | Admitting: Family

## 2011-11-29 ENCOUNTER — Ambulatory Visit (INDEPENDENT_AMBULATORY_CARE_PROVIDER_SITE_OTHER): Payer: Managed Care, Other (non HMO) | Admitting: Family

## 2011-11-29 VITALS — BP 120/60 | HR 92 | Temp 98.5°F | Wt 233.0 lb

## 2011-11-29 DIAGNOSIS — J019 Acute sinusitis, unspecified: Secondary | ICD-10-CM

## 2011-11-29 DIAGNOSIS — H1045 Other chronic allergic conjunctivitis: Secondary | ICD-10-CM

## 2011-11-29 DIAGNOSIS — H1010 Acute atopic conjunctivitis, unspecified eye: Secondary | ICD-10-CM

## 2011-11-29 MED ORDER — METHYLPREDNISOLONE 4 MG PO KIT: PACK | ORAL | Status: AC

## 2011-11-29 MED ORDER — AMOXICILLIN 500 MG PO TABS: 1000.0000 mg | ORAL_TABLET | Freq: Two times a day (BID) | ORAL | Status: AC

## 2011-11-29 NOTE — Progress Notes (Signed)
Subjective:    Patient ID: [name] [name], female    DOB: 27-Mar-1955, 56 y.o.   MRN: [id number]  HPI 56 year old white female, nonsmoker, patient of Dr. Lovell Sheehan is in today with complaints of sneezing, cough, congestion, sore throat, sinus pressure and pain, and fatigue x2 weeks. She's been taken over-the-counter cold and flu medication with no relief. Patient also has type 2 diabetes that is well controlled usually. She does not check her blood sugars at home.   Review of Systems  Constitutional: Positive for fatigue. Negative for fever and appetite change.  HENT: Positive for congestion, sore throat, rhinorrhea, sneezing, postnasal drip and sinus pressure.   Eyes: Negative.   Respiratory: Positive for cough. Negative for shortness of breath and wheezing.   Gastrointestinal: Negative.   Musculoskeletal: Negative.   Skin: Negative.   Neurological: Negative.   Hematological: Negative.   Psychiatric/Behavioral: Negative.    Past Medical History  Diagnosis Date  . Depression   . GERD (gastroesophageal reflux disease)   . Hypertension   . Hyperlipidemia   . Colon polyps   . Thyroid disease   . History of recurrent UTIs   . Anemia     menstrual related  . Arthritis     right ankle  . Diabetes mellitus 12/2009    type 2  . Alcohol problem drinking     rehab  . Acute meniscal tear of knee     Left knee    History   Social History  . Marital Status: Married    Spouse Name: N/A    Number of Children: N/A  . Years of Education: N/A   Occupational History  . Not on file.   Social History Main Topics  . Smoking status: Former Smoker    Quit date: 02/21/2009  . Smokeless tobacco: Not on file  . Alcohol Use: 0.0 oz/week    2-4 Glasses of wine per week  . Drug Use: No  . Sexually Active: Yes   Other Topics Concern  . Not on file   Social History Narrative  . No narrative on file    Past Surgical History  Procedure Date  . Total knee arthroplasty 2009    right    . Arthroscopy left knee 03/11/2011    Surgery was done in [place] for a meniscal tear    Family History  Problem Relation Age of Onset  . Adopted: Yes  . COPD Mother   . Alcohol abuse Father     No Known Allergies  Current Outpatient Prescriptions on File Prior to Visit  Medication Sig Dispense Refill  . amLODipine-benazepril (LOTREL) 5-10 MG per capsule Take 1 capsule by mouth daily.  90 capsule  3  . B Complex Vitamins (B COMPLEX 100 PO) Take 1 tablet by mouth 2 (two) times daily.      . cetirizine (ZYRTEC) 10 MG tablet Take 1 tablet (10 mg total) by mouth daily.  90 tablet  3  . Cholecalciferol (VITAMIN D3) 2000 UNITS TABS Take by mouth. 2500iu daily       . citalopram (CELEXA) 40 MG tablet Take 1 tablet (40 mg total) by mouth daily.  90 tablet  3  . ezetimibe (ZETIA) 10 MG tablet Take 1 tablet (10 mg total) by mouth daily.  30 tablet  11  . glipiZIDE (GLUCOTROL) 5 MG tablet Take 1 tablet (5 mg total) by mouth daily.  90 tablet  3  . lansoprazole (PREVACID) 30 MG capsule Take 1 capsule (30 mg total)  by mouth daily.  90 capsule  3  . levothyroxine (SYNTHROID, LEVOTHROID) 137 MCG tablet Take 1 tablet (137 mcg total) by mouth daily.  90 tablet  3  . metFORMIN (GLUCOPHAGE) 500 MG tablet Take 500 mg by mouth. 3 TABS  EVERY DAY      . simvastatin (ZOCOR) 40 MG tablet Take 1 tablet (40 mg total) by mouth every evening.  30 tablet  11    BP 120/60  Pulse 92  Temp 98.5 F (36.9 C) (Oral)  Wt 233 lb (105.688 kg)  SpO2 98%chart    Objective:   Physical Exam  Constitutional: She is oriented to person, place, and time. She appears well-developed and well-nourished.  HENT:  Right Ear: External ear normal.  Left Ear: External ear normal.  Nose: Nose normal.  Mouth/Throat: Oropharynx is clear and moist.  Neck: Normal range of motion. Neck supple.  Cardiovascular: Normal rate, regular rhythm and normal heart sounds.   Pulmonary/Chest: Effort normal and breath sounds normal.   Abdominal: Soft. Bowel sounds are normal.  Musculoskeletal: Normal range of motion.  Neurological: She is alert and oriented to person, place, and time.  Skin: Skin is warm and dry.  Psychiatric: She has a normal mood and affect.          Assessment & Plan:  Assessment: Acute sinusitis-uncontrolled, cough-uncontrolled, type 2 diabetes-stable  Plan: Medrol Dosepak as directed. Patient about the blood sugars may run slightly elevated over the next several days. Amoxicillin 500 mg 2 capsules by mouth twice a day x10 days. Rest. Drink plenty of fluids. Patient call the office if symptoms worsen or persist. Recheck a schedule, appearing.

## 2011-11-29 NOTE — Patient Instructions (Addendum)

## 2011-12-05 ENCOUNTER — Ambulatory Visit: Payer: Self-pay | Admitting: Internal Medicine

## 2011-12-12 ENCOUNTER — Telehealth: Payer: Self-pay | Admitting: Internal Medicine

## 2011-12-12 DIAGNOSIS — N63 Unspecified lump in unspecified breast: Secondary | ICD-10-CM

## 2011-12-12 NOTE — Telephone Encounter (Signed)
Has new lump now and referral sent to [facility] mamm-rt breast

## 2011-12-12 NOTE — Telephone Encounter (Signed)
Pt had mammogram in July 2013. Pt has lump in breast. Please advise

## 2011-12-15 ENCOUNTER — Other Ambulatory Visit: Payer: Self-pay | Admitting: *Deleted

## 2011-12-15 MED ORDER — CEPHALEXIN 500 MG PO CAPS: 500.0000 mg | ORAL_CAPSULE | Freq: Four times a day (QID) | ORAL | Status: DC

## 2011-12-22 ENCOUNTER — Encounter: Payer: Self-pay | Admitting: Internal Medicine

## 2012-01-27 ENCOUNTER — Ambulatory Visit: Payer: Managed Care, Other (non HMO) | Admitting: Internal Medicine

## 2012-01-31 ENCOUNTER — Telehealth: Payer: Self-pay | Admitting: Internal Medicine

## 2012-01-31 NOTE — Telephone Encounter (Signed)
Pt instructed to call pharmacy and ask for refills

## 2012-01-31 NOTE — Telephone Encounter (Signed)
Patient called stating that she need a refill of all of her meds sent to [facility] [address]. Too many to list. Please assist.

## 2012-02-02 ENCOUNTER — Ambulatory Visit (INDEPENDENT_AMBULATORY_CARE_PROVIDER_SITE_OTHER): Payer: Managed Care, Other (non HMO) | Admitting: Internal Medicine

## 2012-02-02 ENCOUNTER — Encounter: Payer: Self-pay | Admitting: Internal Medicine

## 2012-02-02 VITALS — BP 144/76 | HR 76 | Temp 98.2°F | Resp 16 | Ht 67.0 in | Wt 242.0 lb

## 2012-02-02 DIAGNOSIS — IMO0001 Reserved for inherently not codable concepts without codable children: Secondary | ICD-10-CM

## 2012-02-02 DIAGNOSIS — E669 Obesity, unspecified: Secondary | ICD-10-CM

## 2012-02-02 DIAGNOSIS — G4700 Insomnia, unspecified: Secondary | ICD-10-CM

## 2012-02-02 DIAGNOSIS — E039 Hypothyroidism, unspecified: Secondary | ICD-10-CM

## 2012-02-02 DIAGNOSIS — I10 Essential (primary) hypertension: Secondary | ICD-10-CM

## 2012-02-02 LAB — BASIC METABOLIC PANEL
BUN: 6 mg/dL (ref 6–23)
CO2: 29 mEq/L (ref 19–32)
Calcium: 9.5 mg/dL (ref 8.4–10.5)
Chloride: 98 mEq/L (ref 96–112)
Creatinine, Ser: 0.9 mg/dL (ref 0.4–1.2)
GFR: 73.15 mL/min (ref >60.00)
Glucose, Bld: 125 mg/dL — ABNORMAL HIGH (ref 70–99)
Potassium: 3.4 mEq/L — ABNORMAL LOW (ref 3.5–5.1)
Sodium: 136 mEq/L (ref 135–145)

## 2012-02-02 LAB — HEMOGLOBIN A1C: Hgb A1c MFr Bld: 5.4 % (ref 4.6–6.5)

## 2012-02-02 MED ORDER — ZALEPLON 10 MG PO CAPS: 10.0000 mg | ORAL_CAPSULE | Freq: Every day | ORAL | Status: DC

## 2012-02-02 NOTE — Patient Instructions (Signed)
Calorie Counting Diet A calorie counting diet requires you to eat the number of calories that are right for you in a day. Calories are the measurement of how much energy you get from the food you eat. Eating the right amount of calories is important for staying at a healthy weight. If you eat too many calories, your body will store them as fat and you may gain weight. If you eat too few calories, you may lose weight. Counting the number of calories you eat during a day will help you know if you are eating the right amount. A Registered Dietitian can determine how many calories you need in a day. The amount of calories needed varies from person to person. If your goal is to lose weight, you will need to eat fewer calories. Losing weight can benefit you if you are overweight or have health problems such as heart disease, high blood pressure, or diabetes. If your goal is to gain weight, you will need to eat more calories. Gaining weight may be necessary if you have a certain health problem that causes your body to need more energy. TIPS Whether you are increasing or decreasing the number of calories you eat during a day, it may be hard to get used to changes in what you eat and drink. The following are tips to help you keep track of the number of calories you eat.  Measure foods at home with measuring cups. This helps you know the amount of food and number of calories you are eating.  Restaurants often serve food in amounts that are larger than 1 serving. While eating out, estimate how many servings of a food you are given. For example, a serving of cooked rice is  cup or about the size of half of a fist. Knowing serving sizes will help you be aware of how much food you are eating at restaurants.  Ask for smaller portion sizes or child-size portions at restaurants.  Plan to eat half of a meal at a restaurant. Take the rest home or share the other half with a friend.  Read the Nutrition Facts panel on  food labels for calorie content and serving size. You can find out how many servings are in a package, the size of a serving, and the number of calories each serving has.  For example, a package might contain 3 cookies. The Nutrition Facts panel on that package says that 1 serving is 1 cookie. Below that, it will say there are 3 servings in the container. The calories section of the Nutrition Facts label says there are 90 calories. This means there are 90 calories in 1 cookie (1 serving). If you eat 1 cookie you have eaten 90 calories. If you eat all 3 cookies, you have eaten 270 calories (3 servings x 90 calories = 270 calories). The list below tells you how big or small some common portion sizes are.  1 oz.........4 stacked dice.  3 oz.........Deck of cards.  1 tsp........Tip of little finger.  1 tbs........Thumb.  2 tbs........Golf ball.   cup.......Half of a fist.  1 cup........A fist. KEEP A FOOD LOG Write down every food item you eat, the amount you eat, and the number of calories in each food you eat during the day. At the end of the day, you can add up the total number of calories you have eaten. It may help to keep a list like the one below. Find out the calorie information by reading the   Nutrition Facts panel on food labels. Breakfast  Bran cereal (1 cup, 110 calories).  Fat-free milk ( cup, 45 calories). Snack  Apple (1 medium, 80 calories). Lunch  Spinach (1 cup, 20 calories).  Tomato ( medium, 20 calories).  Chicken breast strips (3 oz, 165 calories).  Shredded cheddar cheese ( cup, 110 calories).  Light Italian dressing (2 tbs, 60 calories).  Whole-wheat bread (1 slice, 80 calories).  Tub margarine (1 tsp, 35 calories).  Vegetable soup (1 cup, 160 calories). Dinner  Pork chop (3 oz, 190 calories).  Brown rice (1 cup, 215 calories).  Steamed broccoli ( cup, 20 calories).  Strawberries (1  cup, 65 calories).  Whipped cream (1 tbs, 50  calories). Daily Calorie Total: 1425 Document Released: 02/07/2005 Document Revised: 05/02/2011 Document Reviewed: 08/04/2006 [employer] Patient Information 2013 [employer].  

## 2012-02-02 NOTE — Progress Notes (Signed)
Subjective:    Patient ID: [name] [name], female    DOB: Jul 21, 1955, 56 y.o.   MRN: [id number]  HPI The patient has gained weight Blood pressure 130/82 Stable on medicatons Weight gain due to poor diet insomnia  Reviewed alcohol use  Review of Systems  Constitutional: Negative for activity change, appetite change and fatigue.  HENT: Negative for ear pain, congestion, neck pain, postnasal drip and sinus pressure.   Eyes: Negative for redness and visual disturbance.  Respiratory: Negative for cough, shortness of breath and wheezing.   Gastrointestinal: Negative for abdominal pain and abdominal distention.  Genitourinary: Negative for dysuria, frequency and menstrual problem.  Musculoskeletal: Negative for myalgias, joint swelling and arthralgias.  Skin: Negative for rash and wound.  Neurological: Negative for dizziness, weakness and headaches.  Hematological: Negative for adenopathy. Does not bruise/bleed easily.  Psychiatric/Behavioral: Negative for sleep disturbance and decreased concentration.   Past Medical History  Diagnosis Date  . Depression   . GERD (gastroesophageal reflux disease)   . Hypertension   . Hyperlipidemia   . Colon polyps   . Thyroid disease   . History of recurrent UTIs   . Anemia     menstrual related  . Arthritis     right ankle  . Diabetes mellitus 12/2009    type 2  . Alcohol problem drinking     rehab  . Acute meniscal tear of knee     Left knee    History   Social History  . Marital Status: Married    Spouse Name: N/A    Number of Children: N/A  . Years of Education: N/A   Occupational History  . Not on file.   Social History Main Topics  . Smoking status: Former Smoker    Quit date: 02/21/2009  . Smokeless tobacco: Not on file  . Alcohol Use: 0.0 oz/week    2-4 Glasses of wine per week  . Drug Use: No  . Sexually Active: Yes   Other Topics Concern  . Not on file   Social History Narrative  . No narrative on file     Past Surgical History  Procedure Date  . Total knee arthroplasty 2009    right  . Arthroscopy left knee 03/11/2011    Surgery was done in [place] for a meniscal tear    Family History  Problem Relation Age of Onset  . Adopted: Yes  . COPD Mother   . Alcohol abuse Father     No Known Allergies  Current Outpatient Prescriptions on File Prior to Visit  Medication Sig Dispense Refill  . amLODipine-benazepril (LOTREL) 5-10 MG per capsule Take 1 capsule by mouth daily.  90 capsule  3  . B Complex Vitamins (B COMPLEX 100 PO) Take 1 tablet by mouth 2 (two) times daily.      . cetirizine (ZYRTEC) 10 MG tablet Take 1 tablet (10 mg total) by mouth daily.  90 tablet  3  . Cholecalciferol (VITAMIN D3) 2000 UNITS TABS Take by mouth. 2500iu daily       . glipiZIDE (GLUCOTROL) 5 MG tablet Take 1 tablet (5 mg total) by mouth daily.  90 tablet  3  . lansoprazole (PREVACID) 30 MG capsule Take 1 capsule (30 mg total) by mouth daily.  90 capsule  3  . metFORMIN (GLUCOPHAGE) 500 MG tablet Take 500 mg by mouth. 3 TABS  EVERY DAY      . [DISCONTINUED] citalopram (CELEXA) 40 MG tablet Take 1 tablet (40 mg total)  by mouth daily.  90 tablet  3  . [DISCONTINUED] ezetimibe (ZETIA) 10 MG tablet Take 1 tablet (10 mg total) by mouth daily.  30 tablet  11  . [DISCONTINUED] levothyroxine (SYNTHROID, LEVOTHROID) 137 MCG tablet Take 1 tablet (137 mcg total) by mouth daily.  90 tablet  3  . [DISCONTINUED] simvastatin (ZOCOR) 40 MG tablet Take 1 tablet (40 mg total) by mouth every evening.  30 tablet  11  . zaleplon (SONATA) 10 MG capsule Take 1 capsule (10 mg total) by mouth at bedtime.  20 capsule  5    BP 144/76  Pulse 76  Temp 98.2 F (36.8 C)  Resp 16  Ht 5\' 7"  (1.702 m)  Wt 242 lb (109.77 kg)  BMI 37.90 kg/m2        Objective:   Physical Exam  Nursing note and vitals reviewed. Constitutional: She is oriented to person, place, and time. She appears well-developed and well-nourished. No distress.   HENT:  Head: Normocephalic and atraumatic.  Right Ear: External ear normal.  Left Ear: External ear normal.  Nose: Nose normal.  Mouth/Throat: Oropharynx is clear and moist.  Eyes: Conjunctivae normal and EOM are normal. Pupils are equal, round, and reactive to light.  Neck: Normal range of motion. Neck supple. No JVD present. No tracheal deviation present. No thyromegaly present.  Cardiovascular: Normal rate, regular rhythm, normal heart sounds and intact distal pulses.   No murmur heard. Pulmonary/Chest: Effort normal and breath sounds normal. She has no wheezes. She exhibits no tenderness.  Abdominal: Soft. Bowel sounds are normal.  Musculoskeletal: Normal range of motion. She exhibits no edema and no tenderness.  Lymphadenopathy:    She has no cervical adenopathy.  Neurological: She is alert and oriented to person, place, and time. She has normal reflexes. No cranial nerve deficit.  Skin: Skin is warm and dry. She is not diaphoretic.  Psychiatric: She has a normal mood and affect. Her behavior is normal.          Assessment & Plan:  monitoring of lipids, DM Stable HTN Reviewed diet and ETOH use  Add sonata for insomnia

## 2012-02-03 ENCOUNTER — Other Ambulatory Visit: Payer: Self-pay | Admitting: Internal Medicine

## 2012-02-06 LAB — HM DIABETES EYE EXAM

## 2012-02-08 ENCOUNTER — Telehealth: Payer: Self-pay | Admitting: Internal Medicine

## 2012-02-08 ENCOUNTER — Encounter: Payer: Self-pay | Admitting: Internal Medicine

## 2012-02-08 ENCOUNTER — Ambulatory Visit (INDEPENDENT_AMBULATORY_CARE_PROVIDER_SITE_OTHER): Payer: Managed Care, Other (non HMO) | Admitting: Internal Medicine

## 2012-02-08 VITALS — BP 150/76 | HR 88 | Temp 97.9°F | Resp 20 | Wt 242.0 lb

## 2012-02-08 DIAGNOSIS — R319 Hematuria, unspecified: Secondary | ICD-10-CM

## 2012-02-08 DIAGNOSIS — N390 Urinary tract infection, site not specified: Secondary | ICD-10-CM

## 2012-02-08 DIAGNOSIS — R300 Dysuria: Secondary | ICD-10-CM

## 2012-02-08 DIAGNOSIS — R350 Frequency of micturition: Secondary | ICD-10-CM

## 2012-02-08 DIAGNOSIS — E119 Type 2 diabetes mellitus without complications: Secondary | ICD-10-CM

## 2012-02-08 LAB — POCT URINALYSIS DIPSTICK
Bilirubin, UA: NEGATIVE
Glucose, UA: NEGATIVE
Leukocytes, UA: NEGATIVE
Nitrite, UA: NEGATIVE
Protein, UA: 100
Spec Grav, UA: 1.025
Urobilinogen, UA: 0.2
pH, UA: 5

## 2012-02-08 MED ORDER — CIPROFLOXACIN HCL 250 MG PO TABS: 250.0000 mg | ORAL_TABLET | Freq: Two times a day (BID) | ORAL | Status: DC

## 2012-02-08 NOTE — Progress Notes (Signed)
Subjective:    Patient ID: [name] [name], female    DOB: 21-Jun-1955, 56 y.o.   MRN: [id number]  HPI  55 year old patient who has a history of type 2 diabetes. His has been under excellent control. She presents with a six-day history of urinary frequency and dysuria. No fever chills or flank pain. Her diabetes remains under excellent control and her recent hemoglobin A1c was less than 6  Past Medical History  Diagnosis Date  . Depression   . GERD (gastroesophageal reflux disease)   . Hypertension   . Hyperlipidemia   . Colon polyps   . Thyroid disease   . History of recurrent UTIs   . Anemia     menstrual related  . Arthritis     right ankle  . Diabetes mellitus 12/2009    type 2  . Alcohol problem drinking     rehab  . Acute meniscal tear of knee     Left knee    History   Social History  . Marital Status: Married    Spouse Name: N/A    Number of Children: N/A  . Years of Education: N/A   Occupational History  . Not on file.   Social History Main Topics  . Smoking status: Former Smoker    Quit date: 02/21/2009  . Smokeless tobacco: Not on file  . Alcohol Use: 0.0 oz/week    2-4 Glasses of wine per week  . Drug Use: No  . Sexually Active: Yes   Other Topics Concern  . Not on file   Social History Narrative  . No narrative on file    Past Surgical History  Procedure Date  . Total knee arthroplasty 2009    right  . Arthroscopy left knee 03/11/2011    Surgery was done in [place] for a meniscal tear    Family History  Problem Relation Age of Onset  . Adopted: Yes  . COPD Mother   . Alcohol abuse Father     No Known Allergies  Current Outpatient Prescriptions on File Prior to Visit  Medication Sig Dispense Refill  . amLODipine-benazepril (LOTREL) 5-10 MG per capsule TAKE ONE CAPSULE BY MOUTH ONCE DAILY.  90 capsule  2  . B Complex Vitamins (B COMPLEX 100 PO) Take 1 tablet by mouth 2 (two) times daily.      . cetirizine (ZYRTEC) 10 MG tablet Take 1  tablet (10 mg total) by mouth daily.  90 tablet  3  . Cholecalciferol (VITAMIN D3) 2000 UNITS TABS Take by mouth. 2500iu daily       . citalopram (CELEXA) 40 MG tablet TAKE ONE TABLET BY MOUTH ONCE DAILY.  90 tablet  2  . ezetimibe (ZETIA) 10 MG tablet Take 10 mg by mouth daily.      Marland Kitchen glipiZIDE (GLUCOTROL) 5 MG tablet TAKE ONE TABLET BY MOUTH ONCE DAILY.  90 tablet  2  . lansoprazole (PREVACID) 30 MG capsule Take 1 capsule (30 mg total) by mouth daily.  90 capsule  3  . metFORMIN (GLUCOPHAGE) 500 MG tablet TAKE THREE TABLETS BY MOUTH IN THE AFTERNOON.  270 tablet  2  . simvastatin (ZOCOR) 40 MG tablet Take 40 mg by mouth every evening.      . simvastatin (ZOCOR) 40 MG tablet TAKE ONE TABLET BY MOUTH IN THE EVENING.  30 tablet  10  . SYNTHROID 137 MCG tablet TAKE ONE TABLET BY MOUTH ONCE DAILY.  90 tablet  2  . zaleplon (SONATA) 10 MG  capsule Take 1 capsule (10 mg total) by mouth at bedtime.  20 capsule  5  . ZETIA 10 MG tablet TAKE ONE TABLET BY MOUTH DAILY.  30 tablet  10    BP 150/76  Pulse 88  Temp 97.9 F (36.6 C) (Oral)  Resp 20  Wt 242 lb (109.77 kg)  SpO2 95%       Review of Systems  Genitourinary: Positive for dysuria, urgency and frequency.       Objective:   Physical Exam  Constitutional: She appears well-developed and well-nourished. No distress.  Abdominal: Soft. She exhibits no distension. There is no tenderness. There is no rebound.          Assessment & Plan:   Unit tract infection. Will treat with Cipro Diabetes mellitus stable

## 2012-02-08 NOTE — Telephone Encounter (Signed)
[facility] Triage Call Report Triage Record Num: [id number] Operator: Albertine Grates Patient Name: [name] [name] Call Date & Time: 02/07/2012 5:36:36PM Patient Phone: [phone] PCP: Darryll Capers Patient Gender: Female PCP Fax : [phone] Patient DOB: Jun 18, 1955 [facility] Name[facility] Reason for Call: Caller: [name]; PCP: Darryll Capers (Adults only); CB#: [phone]; Patient has had blood in urine since 12-17. Has painful urination. Afebrile. Advised appointment 12-18 and then husband states she has spoken to a nurse and has appointment scheduled for 12-18. Protocol(s) Used: Bloody Urine Recommended Outcome per Protocol: See Provider within 24 hours Reason for Outcome: Has one or more urinary tract symptoms AND has not been previously evaluated Care Advice: Limit carbonated, alcoholic, and caffeinated beverages such as coffee, tea and soda. Avoid nonprescription cold and allergy medications that contain caffeine. Limit intake of tomatoes, fruit juices (except for unsweetened cranberry juice), dairy products, spicy foods, sugar, and artificial sweeteners (aspartame or saccharine). Stop or decrease smoking. Reducing exposure to bladder irritants may help lessen urgency. ~ Increase intake of fluids to 8-16 eight oz. glasses (2,000 to 4,000 cc per day or 1.6 to 3.2 L) so urine is a pale yellow color, to help flush bacteria from the bladder, unless instructed to restrict fluid intake for other medical reasons. Limit fluids with sugar. ~ 12/

## 2012-02-08 NOTE — Patient Instructions (Signed)
Drink as much fluid as you  can tolerate over the next few days  Take your antibiotic as prescribed until ALL of it is gone, but stop if you develop a rash, swelling, or any side effects of the medication.  Contact our office as soon as possible if  there are side effects of the medication.   

## 2012-03-02 ENCOUNTER — Ambulatory Visit: Payer: Managed Care, Other (non HMO) | Admitting: Internal Medicine

## 2012-03-07 ENCOUNTER — Encounter: Payer: Self-pay | Admitting: Internal Medicine

## 2012-04-20 ENCOUNTER — Other Ambulatory Visit (INDEPENDENT_AMBULATORY_CARE_PROVIDER_SITE_OTHER): Payer: Managed Care, Other (non HMO)

## 2012-04-20 DIAGNOSIS — Z0000 Encounter for general adult medical examination without abnormal findings: Secondary | ICD-10-CM

## 2012-04-20 LAB — CBC WITH DIFFERENTIAL/PLATELET
Basophils Absolute: 0 10*3/uL (ref 0.0–0.1)
Basophils Relative: 0.2 % (ref 0.0–3.0)
Eosinophils Absolute: 0.1 10*3/uL (ref 0.0–0.7)
Eosinophils Relative: 0.7 % (ref 0.0–5.0)
HCT: 40.1 % (ref 36.0–46.0)
Hemoglobin: 13.9 g/dL (ref 12.0–15.0)
Lymphocytes Relative: 22.4 % (ref 12.0–46.0)
Lymphs Abs: 1.7 10*3/uL (ref 0.7–4.0)
MCHC: 34.8 g/dL (ref 30.0–36.0)
MCV: 101.2 fl — ABNORMAL HIGH (ref 78.0–100.0)
Monocytes Absolute: 0.7 10*3/uL (ref 0.1–1.0)
Monocytes Relative: 9.5 % (ref 3.0–12.0)
Neutro Abs: 5 10*3/uL (ref 1.4–7.7)
Neutrophils Relative %: 67.2 % (ref 43.0–77.0)
Platelets: 269 10*3/uL (ref 150.0–400.0)
RBC: 3.96 Mil/uL (ref 3.87–5.11)
RDW: 14.3 % (ref 11.5–14.6)
WBC: 7.4 10*3/uL (ref 4.5–10.5)

## 2012-04-20 LAB — BASIC METABOLIC PANEL
BUN: 6 mg/dL (ref 6–23)
CO2: 26 mEq/L (ref 19–32)
Calcium: 9.2 mg/dL (ref 8.4–10.5)
Chloride: 97 mEq/L (ref 96–112)
Creatinine, Ser: 0.7 mg/dL (ref 0.4–1.2)
GFR: 88.52 mL/min (ref >60.00)
Glucose, Bld: 78 mg/dL (ref 70–99)
Potassium: 3.5 mEq/L (ref 3.5–5.1)
Sodium: 135 mEq/L (ref 135–145)

## 2012-04-20 LAB — MICROALBUMIN / CREATININE URINE RATIO
Creatinine,U: 244.2 mg/dL
Microalb Creat Ratio: 0.9 mg/g (ref 0.0–30.0)
Microalb, Ur: 2.1 mg/dL — ABNORMAL HIGH (ref 0.0–1.9)

## 2012-04-20 LAB — LIPID PANEL
Cholesterol: 136 mg/dL (ref 0–200)
HDL: 35.2 mg/dL — ABNORMAL LOW (ref >39.00)
LDL Cholesterol: 77 mg/dL (ref 0–99)
Total CHOL/HDL Ratio: 4
Triglycerides: 117 mg/dL (ref 0.0–149.0)
VLDL: 23.4 mg/dL (ref 0.0–40.0)

## 2012-04-20 LAB — POCT URINALYSIS DIPSTICK
Blood, UA: NEGATIVE
Glucose, UA: NEGATIVE
Leukocytes, UA: NEGATIVE
Nitrite, UA: NEGATIVE
Spec Grav, UA: 1.02
Urobilinogen, UA: >=8
pH, UA: 6

## 2012-04-20 LAB — HEPATIC FUNCTION PANEL
ALT: 61 U/L — ABNORMAL HIGH (ref 0–35)
AST: 78 U/L — ABNORMAL HIGH (ref 0–37)
Albumin: 3.5 g/dL (ref 3.5–5.2)
Alkaline Phosphatase: 109 U/L (ref 39–117)
Bilirubin, Direct: 0.7 mg/dL — ABNORMAL HIGH (ref 0.0–0.3)
Total Bilirubin: 1.9 mg/dL — ABNORMAL HIGH (ref 0.3–1.2)
Total Protein: 6.9 g/dL (ref 6.0–8.3)

## 2012-04-20 LAB — HEMOGLOBIN A1C: Hgb A1c MFr Bld: 5.2 % (ref 4.6–6.5)

## 2012-04-20 LAB — TSH: TSH: 8.23 u[IU]/mL — ABNORMAL HIGH (ref 0.35–5.50)

## 2012-04-27 ENCOUNTER — Encounter: Payer: Self-pay | Admitting: Internal Medicine

## 2012-06-06 ENCOUNTER — Ambulatory Visit (INDEPENDENT_AMBULATORY_CARE_PROVIDER_SITE_OTHER): Payer: Managed Care, Other (non HMO) | Admitting: Internal Medicine

## 2012-06-06 ENCOUNTER — Encounter: Payer: Self-pay | Admitting: Internal Medicine

## 2012-06-06 VITALS — BP 136/82 | HR 76 | Temp 98.1°F | Resp 16 | Ht 67.0 in | Wt 240.0 lb

## 2012-06-06 DIAGNOSIS — Z23 Encounter for immunization: Secondary | ICD-10-CM

## 2012-06-06 DIAGNOSIS — E785 Hyperlipidemia, unspecified: Secondary | ICD-10-CM

## 2012-06-06 DIAGNOSIS — Z0000 Encounter for general adult medical examination without abnormal findings: Secondary | ICD-10-CM

## 2012-06-06 DIAGNOSIS — F329 Major depressive disorder, single episode, unspecified: Secondary | ICD-10-CM

## 2012-06-06 DIAGNOSIS — G4700 Insomnia, unspecified: Secondary | ICD-10-CM

## 2012-06-06 MED ORDER — DIAZEPAM 2 MG PO TABS: 4.0000 mg | ORAL_TABLET | Freq: Every evening | ORAL | Status: DC | PRN

## 2012-06-06 MED ORDER — ESCITALOPRAM OXALATE 20 MG PO TABS: 20.0000 mg | ORAL_TABLET | Freq: Every day | ORAL | Status: DC

## 2012-06-06 MED ORDER — ZALEPLON 10 MG PO CAPS: 10.0000 mg | ORAL_CAPSULE | Freq: Every day | ORAL | Status: DC

## 2012-06-06 NOTE — Progress Notes (Signed)
Subjective:    Patient ID: [name] [name], female    DOB: 05-23-1955, 57 y.o.   MRN: [id number]  HPI 35-year-old female who presents for complete physical examination.  She is followed for hypertension adult-onset diabetes hyperlipidemia and hypothyroidism.  She admits to noncompliance with her thyroid medication and admits to recent increased alcohol consumption secondary to the loss of her mother and grieving process.  She is also morbidly obese   Review of Systems  Constitutional: Negative for activity change, appetite change and fatigue.       Morbid obesity  HENT: Negative for ear pain, congestion, neck pain, postnasal drip and sinus pressure.   Eyes: Negative for redness and visual disturbance.  Respiratory: Positive for cough. Negative for shortness of breath and wheezing.   Gastrointestinal: Negative for abdominal pain and abdominal distention.  Genitourinary: Negative for dysuria, frequency and menstrual problem.  Musculoskeletal: Negative for myalgias, joint swelling and arthralgias.  Skin: Negative for rash and wound.  Neurological: Negative for dizziness, weakness and headaches.  Hematological: Negative for adenopathy. Does not bruise/bleed easily.  Psychiatric/Behavioral: Negative for sleep disturbance and decreased concentration.   Past Medical History  Diagnosis Date  . Depression   . GERD (gastroesophageal reflux disease)   . Hypertension   . Hyperlipidemia   . Colon polyps   . Thyroid disease   . History of recurrent UTIs   . Anemia     menstrual related  . Arthritis     right ankle  . Diabetes mellitus 12/2009    type 2  . Alcohol problem drinking     rehab  . Acute meniscal tear of knee     Left knee    History   Social History  . Marital Status: Married    Spouse Name: N/A    Number of Children: N/A  . Years of Education: N/A   Occupational History  . Not on file.   Social History Main Topics  . Smoking status: Former Smoker    Quit  date: 02/21/2009  . Smokeless tobacco: Not on file  . Alcohol Use: 0.0 oz/week    2-4 Glasses of wine per week  . Drug Use: No  . Sexually Active: Yes   Other Topics Concern  . Not on file   Social History Narrative  . No narrative on file    Past Surgical History  Procedure Laterality Date  . Total knee arthroplasty  2009    right  . Arthroscopy left knee  03/11/2011    Surgery was done in [place] for a meniscal tear    Family History  Problem Relation Age of Onset  . Adopted: Yes  . COPD Mother   . Alcohol abuse Father     No Known Allergies  Current Outpatient Prescriptions on File Prior to Visit  Medication Sig Dispense Refill  . amLODipine-benazepril (LOTREL) 5-10 MG per capsule TAKE ONE CAPSULE BY MOUTH ONCE DAILY.  90 capsule  2  . B Complex Vitamins (B COMPLEX 100 PO) Take 1 tablet by mouth 2 (two) times daily.      . cetirizine (ZYRTEC) 10 MG tablet Take 1 tablet (10 mg total) by mouth daily.  90 tablet  3  . Cholecalciferol (VITAMIN D3) 2000 UNITS TABS Take by mouth. 2500iu daily       . ezetimibe (ZETIA) 10 MG tablet Take 10 mg by mouth daily.      Marland Kitchen glipiZIDE (GLUCOTROL) 5 MG tablet TAKE ONE TABLET BY MOUTH ONCE DAILY.  90  tablet  2  . lansoprazole (PREVACID) 30 MG capsule Take 1 capsule (30 mg total) by mouth daily.  90 capsule  3  . metFORMIN (GLUCOPHAGE) 500 MG tablet TAKE THREE TABLETS BY MOUTH IN THE AFTERNOON.  270 tablet  2  . simvastatin (ZOCOR) 40 MG tablet TAKE ONE TABLET BY MOUTH IN THE EVENING.  30 tablet  10  . SYNTHROID 137 MCG tablet TAKE ONE TABLET BY MOUTH ONCE DAILY.  90 tablet  2  . zaleplon (SONATA) 10 MG capsule Take 1 capsule (10 mg total) by mouth at bedtime.  20 capsule  5   No current facility-administered medications on file prior to visit.    BP 136/82  Pulse 76  Temp(Src) 98.1 F (36.7 C)  Resp 16  Ht 5\' 7"  (1.702 m)  Wt 240 lb (108.863 kg)  BMI 37.58 kg/m2       Objective:   Physical Exam  Constitutional: She is  oriented to person, place, and time. She appears well-developed and well-nourished. No distress.  Morbid obesity  HENT:  Head: Normocephalic and atraumatic.  Right Ear: External ear normal.  Left Ear: External ear normal.  Nose: Nose normal.  Mouth/Throat: Oropharynx is clear and moist.  Eyes: Conjunctivae and EOM are normal. Pupils are equal, round, and reactive to light.  Neck: Normal range of motion. Neck supple. No JVD present. No tracheal deviation present. No thyromegaly present.  Cardiovascular: Normal rate, regular rhythm, normal heart sounds and intact distal pulses.   No murmur heard. Pulmonary/Chest: Effort normal and breath sounds normal. She has no wheezes. She exhibits no tenderness.  Abdominal: Soft. Bowel sounds are normal. She exhibits distension. There is tenderness.  Musculoskeletal: Normal range of motion. She exhibits no edema and no tenderness.  Lymphadenopathy:    She has no cervical adenopathy.  Neurological: She is alert and oriented to person, place, and time. She has normal reflexes. No cranial nerve deficit.  Skin: Skin is warm and dry. She is not diaphoretic.  Psychiatric: She has a normal mood and affect. Her behavior is normal.           This is a routine physical examination for this healthy  Female. Reviewed all health maintenance protocols including mammography colonoscopy bone density and reviewed appropriate screening labs. Her immunization history was reviewed as well as her current medications and allergies refills of her chronic medications were given and the plan for yearly health maintenance was discussed all orders and referrals were made as appropriate. Assessment & Plan:   This is a routine physical examination for this healthy  Female. Reviewed all health maintenance protocols including mammography colonoscopy bone density and reviewed appropriate screening labs. Her immunization history was reviewed as well as her current medications and  allergies refills of her chronic medications were given and the plan for yearly health maintenance was discussed all orders and referrals were made as appropriate.   Lipids are at goal with total cholesterol 136 and LDL cholesterol 77 TSH is elevated secondary to noncompliance with her Synthroid urge compliance.  Increased liver functions secondary to alcohol consumption urge abstinence as the best strategy for her.  Blood pressure stable diabetes stable with good hemoglobin A1c insomnia and depression increase Sonata to 10 mg by mouth each bedtime when necessary and add 10 mg of Valium for anxiety she is also now on Lexapro 10 mg by mouth daily

## 2012-07-04 ENCOUNTER — Telehealth: Payer: Self-pay | Admitting: Internal Medicine

## 2012-07-04 MED ORDER — EZETIMIBE 10 MG PO TABS: 10.0000 mg | ORAL_TABLET | Freq: Every day | ORAL | Status: DC

## 2012-07-04 NOTE — Telephone Encounter (Signed)
Patient called stating that she would like her zetia changed from a 30 day rx to a 90 day rx sent to [facility] on [address]. Please assist.

## 2012-07-04 NOTE — Telephone Encounter (Signed)
It has been done.

## 2012-08-08 ENCOUNTER — Telehealth: Payer: Self-pay | Admitting: Internal Medicine

## 2012-08-08 ENCOUNTER — Ambulatory Visit (INDEPENDENT_AMBULATORY_CARE_PROVIDER_SITE_OTHER): Payer: Managed Care, Other (non HMO) | Admitting: Family

## 2012-08-08 ENCOUNTER — Encounter: Payer: Self-pay | Admitting: Family

## 2012-08-08 VITALS — BP 128/78 | HR 67 | Wt 238.0 lb

## 2012-08-08 DIAGNOSIS — R220 Localized swelling, mass and lump, head: Secondary | ICD-10-CM

## 2012-08-08 DIAGNOSIS — T148XXA Other injury of unspecified body region, initial encounter: Secondary | ICD-10-CM

## 2012-08-08 DIAGNOSIS — E039 Hypothyroidism, unspecified: Secondary | ICD-10-CM

## 2012-08-08 DIAGNOSIS — R221 Localized swelling, mass and lump, neck: Secondary | ICD-10-CM

## 2012-08-08 DIAGNOSIS — F1010 Alcohol abuse, uncomplicated: Secondary | ICD-10-CM

## 2012-08-08 LAB — HEPATIC FUNCTION PANEL
ALT: 54 U/L — ABNORMAL HIGH (ref 0–35)
AST: 135 U/L — ABNORMAL HIGH (ref 0–37)
Albumin: 3.9 g/dL (ref 3.5–5.2)
Alkaline Phosphatase: 95 U/L (ref 39–117)
Bilirubin, Direct: 0.2 mg/dL (ref 0.0–0.3)
Total Bilirubin: 0.7 mg/dL (ref 0.3–1.2)
Total Protein: 7.6 g/dL (ref 6.0–8.3)

## 2012-08-08 LAB — CBC WITH DIFFERENTIAL/PLATELET
Basophils Absolute: 0 10*3/uL (ref 0.0–0.1)
Basophils Relative: 0.5 % (ref 0.0–3.0)
Eosinophils Absolute: 0.1 10*3/uL (ref 0.0–0.7)
Eosinophils Relative: 2 % (ref 0.0–5.0)
HCT: 40.3 % (ref 36.0–46.0)
Hemoglobin: 13.5 g/dL (ref 12.0–15.0)
Lymphocytes Relative: 50.8 % — ABNORMAL HIGH (ref 12.0–46.0)
Lymphs Abs: 2.6 10*3/uL (ref 0.7–4.0)
MCHC: 33.6 g/dL (ref 30.0–36.0)
MCV: 103.2 fl — ABNORMAL HIGH (ref 78.0–100.0)
Monocytes Absolute: 0.5 10*3/uL (ref 0.1–1.0)
Monocytes Relative: 9.5 % (ref 3.0–12.0)
Neutro Abs: 1.9 10*3/uL (ref 1.4–7.7)
Neutrophils Relative %: 37.2 % — ABNORMAL LOW (ref 43.0–77.0)
Platelets: 195 10*3/uL (ref 150.0–400.0)
RBC: 3.91 Mil/uL (ref 3.87–5.11)
RDW: 14.3 % (ref 11.5–14.6)
WBC: 5.2 10*3/uL (ref 4.5–10.5)

## 2012-08-08 LAB — TSH: TSH: 8.09 u[IU]/mL — ABNORMAL HIGH (ref 0.35–5.50)

## 2012-08-08 NOTE — Telephone Encounter (Signed)
Appointment give with padonda this pm
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
## 2012-08-09 ENCOUNTER — Other Ambulatory Visit: Payer: Self-pay | Admitting: Family

## 2012-08-09 MED ORDER — LEVOTHYROXINE SODIUM 150 MCG PO TABS: 150.0000 ug | ORAL_TABLET | Freq: Every day | ORAL | Status: DC

## 2012-08-09 NOTE — Progress Notes (Signed)
Subjective:    Patient ID: [name] [name], female    DOB: January 26, 1956, 57 y.o.   MRN: [id number]  HPI 57 year old white female, nonsmoker, with a history of alcohol abuse is in today with complaints of a mass to her left neck that she noticed approximately 4 days ago. It is firm but no pain. She's unsure of how long it's been there. She has a history of hypothyroidism and in the past had been medication noncompliant. However, she reports she hasn't taken her medication daily since her last office visit. Reports alcohol and take has increased. Drinks a gallon of vodka approximately every 3 days. Patient reports that she is aware that she has a problem, like help but just does not know how to seek it. She has been in alcohol rehabilitation previously but did not like it because it was Christian-based.   Review of Systems  Constitutional: Positive for fatigue.  HENT:       Mass to the front of the neck, nontender  Eyes: Negative.   Respiratory: Negative.   Cardiovascular: Negative.   Gastrointestinal: Negative.   Musculoskeletal: Negative.   Skin: Positive for color change.       bruising to her chest  Allergic/Immunologic: Negative.   Neurological: Negative.   Hematological: Negative.   Psychiatric/Behavioral: Positive for confusion and agitation. Negative for sleep disturbance and self-injury. The patient is nervous/anxious.        Alcohol abuse/dependence    Past Medical History  Diagnosis Date  . Depression   . GERD (gastroesophageal reflux disease)   . Hypertension   . Hyperlipidemia   . Colon polyps   . Thyroid disease   . History of recurrent UTIs   . Anemia     menstrual related  . Arthritis     right ankle  . Diabetes mellitus 12/2009    type 2  . Alcohol problem drinking     rehab  . Acute meniscal tear of knee     Left knee    History   Social History  . Marital Status: Married    Spouse Name: N/A    Number of Children: N/A  . Years of Education: N/A    Occupational History  . Not on file.   Social History Main Topics  . Smoking status: Former Smoker    Quit date: 02/21/2009  . Smokeless tobacco: Not on file  . Alcohol Use: 0.0 oz/week    2-4 Glasses of wine per week  . Drug Use: No  . Sexually Active: Yes   Other Topics Concern  . Not on file   Social History Narrative  . No narrative on file    Past Surgical History  Procedure Laterality Date  . Total knee arthroplasty  2009    right  . Arthroscopy left knee  03/11/2011    Surgery was done in [place] for a meniscal tear    Family History  Problem Relation Age of Onset  . Adopted: Yes  . COPD Mother   . Alcohol abuse Father     No Known Allergies  Current Outpatient Prescriptions on File Prior to Visit  Medication Sig Dispense Refill  . amLODipine-benazepril (LOTREL) 5-10 MG per capsule TAKE ONE CAPSULE BY MOUTH ONCE DAILY.  90 capsule  2  . B Complex Vitamins (B COMPLEX 100 PO) Take 1 tablet by mouth 2 (two) times daily.      . cetirizine (ZYRTEC) 10 MG tablet Take 1 tablet (10 mg total) by mouth daily.  90 tablet  3  . Cholecalciferol (VITAMIN D3) 2000 UNITS TABS Take by mouth. 2500iu daily       . diazepam (VALIUM) 2 MG tablet Take 2 tablets (4 mg total) by mouth at bedtime as needed for anxiety.  60 tablet  4  . escitalopram (LEXAPRO) 20 MG tablet Take 1 tablet (20 mg total) by mouth daily.  30 tablet  11  . ezetimibe (ZETIA) 10 MG tablet Take 1 tablet (10 mg total) by mouth daily.  90 tablet  3  . glipiZIDE (GLUCOTROL) 5 MG tablet TAKE ONE TABLET BY MOUTH ONCE DAILY.  90 tablet  2  . lansoprazole (PREVACID) 30 MG capsule Take 1 capsule (30 mg total) by mouth daily.  90 capsule  3  . metFORMIN (GLUCOPHAGE) 500 MG tablet TAKE THREE TABLETS BY MOUTH IN THE AFTERNOON.  270 tablet  2  . simvastatin (ZOCOR) 40 MG tablet TAKE ONE TABLET BY MOUTH IN THE EVENING.  30 tablet  10  . zaleplon (SONATA) 10 MG capsule Take 1 capsule (10 mg total) by mouth at bedtime.  30  capsule  5   No current facility-administered medications on file prior to visit.    BP 128/78  Pulse 67  Wt 238 lb (107.956 kg)  BMI 37.27 kg/m2  SpO2 97%chart     Objective:   Physical Exam  Constitutional: She is oriented to person, place, and time.  Obviously intoxicated. Poor hygiene.  Neck: Normal range of motion. Neck supple. No thyromegaly present.  Pea size, palpable, firm mass noted inside of the tissue of the left anterior neck. No red nares. No drainage or discharge.  Cardiovascular: Normal rate, regular rhythm and normal heart sounds.   Pulmonary/Chest: Effort normal and breath sounds normal.  Abdominal: Soft. Bowel sounds are normal. There is no tenderness. There is no rebound and no guarding.  Musculoskeletal: Normal range of motion.  Neurological: She is alert and oriented to person, place, and time. She has normal reflexes.  Skin: Skin is warm and dry.  Faint bruising noted across the chest. No pain. No injury.   Psychiatric: She has a normal mood and affect.          Assessment & Plan:  Assessment: 1. Mass neck 2. Bruising 3. Alcohol abuse 4. Hypothyroidism 5. Depression  Plan: We'll obtain an ultrasound of the mass in her neck to rule out any potential malignancy. I believe her bruising her strongly to the amount of alcohol she consumes. I strongly encouraged her to decrease her consumption and seek help. Rehabilitation facility and information provided for patient today. Recheck TSH and adjust Synthroid accordingly since patient reports medication compliance. Followup pending the results of the ultrasound and sooner as needed.

## 2012-08-13 ENCOUNTER — Ambulatory Visit
Admission: RE | Admit: 2012-08-13 | Discharge: 2012-08-13 | Disposition: A | Discharge status: Home / Self Care | Payer: Managed Care, Other (non HMO) | Source: Ambulatory Visit | Attending: Family | Admitting: Family

## 2012-08-13 DIAGNOSIS — R221 Localized swelling, mass and lump, neck: Secondary | ICD-10-CM

## 2012-08-14 ENCOUNTER — Other Ambulatory Visit: Payer: Self-pay | Admitting: Family

## 2012-08-14 DIAGNOSIS — R221 Localized swelling, mass and lump, neck: Secondary | ICD-10-CM

## 2012-08-20 ENCOUNTER — Telehealth: Payer: Self-pay | Admitting: Internal Medicine

## 2012-08-20 NOTE — Telephone Encounter (Signed)
Pt called stating that she canceled her appt to general surgery because she was never informed of the results of her [place]. Pt states she she had no idea that she was being referred anywhere. Pt requesting to be contacted about her [place] results. Pt will not need a new referral she can reschedule the appt if she would like after she find out the results of the [place]

## 2012-08-20 NOTE — Telephone Encounter (Signed)
Pt called

## 2012-08-21 NOTE — Telephone Encounter (Signed)
Patient was made aware via mychart 6/24. [name] [name] please call.

## 2012-08-21 NOTE — Telephone Encounter (Signed)
Pt states that she rescheduled the surgery appointment

## 2012-08-27 ENCOUNTER — Ambulatory Visit (INDEPENDENT_AMBULATORY_CARE_PROVIDER_SITE_OTHER): Payer: Managed Care, Other (non HMO) | Admitting: Surgery

## 2012-09-17 ENCOUNTER — Other Ambulatory Visit (INDEPENDENT_AMBULATORY_CARE_PROVIDER_SITE_OTHER): Payer: Self-pay | Admitting: Surgery

## 2012-09-17 ENCOUNTER — Ambulatory Visit (INDEPENDENT_AMBULATORY_CARE_PROVIDER_SITE_OTHER): Payer: Managed Care, Other (non HMO) | Admitting: Surgery

## 2012-09-17 ENCOUNTER — Encounter (INDEPENDENT_AMBULATORY_CARE_PROVIDER_SITE_OTHER): Payer: Self-pay | Admitting: Surgery

## 2012-09-17 VITALS — BP 138/78 | HR 76 | Temp 98.2°F | Resp 16 | Ht 67.0 in | Wt 231.2 lb

## 2012-09-17 DIAGNOSIS — E041 Nontoxic single thyroid nodule: Secondary | ICD-10-CM

## 2012-09-17 DIAGNOSIS — E079 Disorder of thyroid, unspecified: Secondary | ICD-10-CM

## 2012-09-17 NOTE — Progress Notes (Signed)
Patient ID: [name] [name], female   DOB: 26-Nov-1955, 57 y.o.   MRN: [id number]  Chief Complaint  Patient presents with  . New Evaluation    eval neck mass on right under chin/and aroun neck    HPI [name] [name] is a 57 y.o. female.   HPI This is a pleasant lady referred by P. Orvan Falconer FNP for evaluation of a neck mass. She asked he presented after several of these nodules  Developed suddenly on her neck. This prompted an ultrasound of the neck. Since then, the nodules are resolving. She has had no similar history of skin nodules and has had no previous history of problems with her thyroid gland. She was sent for an ultrasound which she showed these masses could be just benign subcutaneous nodules she was also found to have a 1.7 cm solid nodule of the right lobe of the thyroid gland. Again, she is asymptomatic Past Medical History  Diagnosis Date  . Depression   . GERD (gastroesophageal reflux disease)   . Hypertension   . Hyperlipidemia   . Colon polyps   . Thyroid disease   . History of recurrent UTIs   . Anemia     menstrual related  . Arthritis     right ankle  . Diabetes mellitus 12/2009    type 2  . Alcohol problem drinking     rehab  . Acute meniscal tear of knee     Left knee    Past Surgical History  Procedure Laterality Date  . Total knee arthroplasty  2009    right  . Arthroscopy left knee  03/11/2011    Surgery was done in [place] for a meniscal tear  . Cesarean section    . Cholecystectomy      Family History  Problem Relation Age of Onset  . Adopted: Yes  . COPD Mother   . Alcohol abuse Father     Social History History  Substance Use Topics  . Smoking status: Former Smoker    Quit date: 02/21/2009  . Smokeless tobacco: Not on file  . Alcohol Use: 0.0 oz/week    2-4 Glasses of wine per week    No Known Allergies  Current Outpatient Prescriptions  Medication Sig Dispense Refill  . amLODipine-benazepril (LOTREL) 5-10 MG per capsule TAKE ONE  CAPSULE BY MOUTH ONCE DAILY.  90 capsule  2  . B Complex Vitamins (B COMPLEX 100 PO) Take 1 tablet by mouth 2 (two) times daily.      . Cholecalciferol (VITAMIN D3) 2000 UNITS TABS Take by mouth. 2500iu daily       . escitalopram (LEXAPRO) 20 MG tablet Take 1 tablet (20 mg total) by mouth daily.  30 tablet  11  . ezetimibe (ZETIA) 10 MG tablet Take 1 tablet (10 mg total) by mouth daily.  90 tablet  3  . glipiZIDE (GLUCOTROL) 5 MG tablet TAKE ONE TABLET BY MOUTH ONCE DAILY.  90 tablet  2  . levothyroxine (SYNTHROID, LEVOTHROID) 150 MCG tablet Take 1 tablet (150 mcg total) by mouth daily.  30 tablet  3  . metFORMIN (GLUCOPHAGE) 500 MG tablet TAKE THREE TABLETS BY MOUTH IN THE AFTERNOON.  270 tablet  2  . simvastatin (ZOCOR) 40 MG tablet TAKE ONE TABLET BY MOUTH IN THE EVENING.  30 tablet  10  . cetirizine (ZYRTEC) 10 MG tablet Take 1 tablet (10 mg total) by mouth daily.  90 tablet  3   No current facility-administered medications for this visit.  Review of Systems Review of Systems  Constitutional: Negative for fever, chills and unexpected weight change.  HENT: Negative for hearing loss, congestion, sore throat, trouble swallowing and voice change.   Eyes: Negative for visual disturbance.  Respiratory: Negative for cough and wheezing.   Cardiovascular: Negative for chest pain, palpitations and leg swelling.  Gastrointestinal: Negative for nausea, vomiting, abdominal pain, diarrhea, constipation, blood in stool, abdominal distention and anal bleeding.  Genitourinary: Negative for hematuria, vaginal bleeding and difficulty urinating.  Musculoskeletal: Negative for arthralgias.  Skin: Negative for rash and wound.  Neurological: Negative for seizures, syncope and headaches.  Hematological: Negative for adenopathy. Does not bruise/bleed easily.  Psychiatric/Behavioral: Negative for confusion.    Blood pressure 138/78, pulse 76, temperature 98.2 F (36.8 C), temperature source Temporal,  resp. rate 16, height 5\' 7"  (1.702 m), weight 231 lb 3.2 oz (104.872 kg).  Physical Exam Physical Exam  Constitutional: She is oriented to person, place, and time. She appears well-developed and well-nourished. No distress.  HENT:  Head: Normocephalic and atraumatic.  Right Ear: External ear normal.  Left Ear: External ear normal.  Nose: Nose normal.  Mouth/Throat: Oropharynx is clear and moist.  Eyes: Conjunctivae are normal. Pupils are equal, round, and reactive to light. Right eye exhibits no discharge. Left eye exhibits no discharge. No scleral icterus.  Neck: Normal range of motion. Neck supple. No tracheal deviation present.  There are some tiny subcutaneous nodules in the neck on the left side. These are very mobile and all approximately 3 mm in size. I cannot palpate the right thyroid nodule  Cardiovascular: Normal rate, regular rhythm, normal heart sounds and intact distal pulses.   No murmur heard. Pulmonary/Chest: Effort normal and breath sounds normal. No respiratory distress. She has no wheezes.  Lymphadenopathy:    She has no cervical adenopathy.  Neurological: She is alert and oriented to person, place, and time.  Skin: Skin is warm and dry. No rash noted. She is not diaphoretic. No erythema.  Psychiatric: Her behavior is normal. Judgment normal.    Data Reviewed She has an ultrasound showing a 1.7 cm right thyroid upper pole nodule  Assessment    Right thyroid nodule as well as subcutaneous soft tissue masses of the neck     Plan    The soft tissue nodules appear to be resolving. I am uncertain of the etiology of these. They do not appear concerning at this time. Of concern, however, is the thyroid nodule. This is a solid nodule and fine-needle aspiration is recommended. I explained this to her in detail and we will schedule her for fine-needle aspiration under ultrasound guidance by the radiologist. I will see her back after this to discuss the results.         [name] [name] A 09/17/2012, 11:54 AM

## 2012-09-18 ENCOUNTER — Other Ambulatory Visit: Payer: Self-pay | Admitting: Radiology

## 2012-09-19 ENCOUNTER — Ambulatory Visit (INDEPENDENT_AMBULATORY_CARE_PROVIDER_SITE_OTHER): Payer: PRIVATE HEALTH INSURANCE | Admitting: Internal Medicine

## 2012-09-19 ENCOUNTER — Encounter: Payer: Self-pay | Admitting: Internal Medicine

## 2012-09-19 VITALS — BP 130/70 | HR 72 | Temp 98.6°F | Resp 16 | Ht 67.0 in | Wt 231.0 lb

## 2012-09-19 DIAGNOSIS — E1110 Type 2 diabetes mellitus with ketoacidosis without coma: Secondary | ICD-10-CM

## 2012-09-19 DIAGNOSIS — E039 Hypothyroidism, unspecified: Secondary | ICD-10-CM

## 2012-09-19 DIAGNOSIS — E1310 Other specified diabetes mellitus with ketoacidosis without coma: Secondary | ICD-10-CM

## 2012-09-19 DIAGNOSIS — Z86010 Personal history of colonic polyps: Secondary | ICD-10-CM

## 2012-09-19 LAB — TSH: TSH: 1.85 u[IU]/mL (ref 0.35–5.50)

## 2012-09-19 LAB — HEMOGLOBIN A1C: Hgb A1c MFr Bld: 5.4 % (ref 4.6–6.5)

## 2012-09-19 NOTE — Progress Notes (Signed)
Subjective:    Patient ID: [name] [name], female    DOB: 06/27/1955, 57 y.o.   MRN: [id number]  HPI  Has had biopsies for 2 possible malignancies breast biopsy was done and she is scheduled for thyroid biopsy she is following up with the general surgeon Dr. Magnus Ivan for the results of these biopsies.  Patient has a history of hypothyroidism with thyroid nodule that was characterized as a solid nodule on the upper pole of the right thyroid disease being biopsied to make sure that it does not represent a neoplastic change.    Review of Systems  Constitutional: Negative for activity change, appetite change and fatigue.  HENT: Negative for ear pain, congestion, neck pain, postnasal drip and sinus pressure.   Eyes: Negative for redness and visual disturbance.  Respiratory: Negative for cough, shortness of breath and wheezing.   Gastrointestinal: Negative for abdominal pain and abdominal distention.  Genitourinary: Negative for dysuria, frequency and menstrual problem.  Musculoskeletal: Negative for myalgias, joint swelling and arthralgias.  Skin: Negative for rash and wound.  Neurological: Negative for dizziness, weakness and headaches.  Hematological: Negative for adenopathy. Does not bruise/bleed easily.  Psychiatric/Behavioral: Negative for sleep disturbance and decreased concentration.   Past Medical History  Diagnosis Date  . Depression   . GERD (gastroesophageal reflux disease)   . Hypertension   . Hyperlipidemia   . Colon polyps   . Thyroid disease   . History of recurrent UTIs   . Anemia     menstrual related  . Arthritis     right ankle  . Diabetes mellitus 12/2009    type 2  . Alcohol problem drinking     rehab  . Acute meniscal tear of knee     Left knee    History   Social History  . Marital Status: Married    Spouse Name: N/A    Number of Children: N/A  . Years of Education: N/A   Occupational History  . Not on file.   Social History Main Topics  .  Smoking status: Former Smoker    Quit date: 02/21/2009  . Smokeless tobacco: Not on file  . Alcohol Use: 0.0 oz/week    2-4 Glasses of wine per week  . Drug Use: No  . Sexually Active: Yes   Other Topics Concern  . Not on file   Social History Narrative  . No narrative on file    Past Surgical History  Procedure Laterality Date  . Total knee arthroplasty  2009    right  . Arthroscopy left knee  03/11/2011    Surgery was done in [place] for a meniscal tear  . Cesarean section    . Cholecystectomy      Family History  Problem Relation Age of Onset  . Adopted: Yes  . COPD Mother   . Alcohol abuse Father     No Known Allergies  Current Outpatient Prescriptions on File Prior to Visit  Medication Sig Dispense Refill  . amLODipine-benazepril (LOTREL) 5-10 MG per capsule TAKE ONE CAPSULE BY MOUTH ONCE DAILY.  90 capsule  2  . B Complex Vitamins (B COMPLEX 100 PO) Take 1 tablet by mouth 2 (two) times daily.      . cetirizine (ZYRTEC) 10 MG tablet Take 1 tablet (10 mg total) by mouth daily.  90 tablet  3  . Cholecalciferol (VITAMIN D3) 2000 UNITS TABS Take by mouth. 2500iu daily       . escitalopram (LEXAPRO) 20 MG tablet Take  1 tablet (20 mg total) by mouth daily.  30 tablet  11  . ezetimibe (ZETIA) 10 MG tablet Take 1 tablet (10 mg total) by mouth daily.  90 tablet  3  . glipiZIDE (GLUCOTROL) 5 MG tablet TAKE ONE TABLET BY MOUTH ONCE DAILY.  90 tablet  2  . levothyroxine (SYNTHROID, LEVOTHROID) 150 MCG tablet Take 1 tablet (150 mcg total) by mouth daily.  30 tablet  3  . metFORMIN (GLUCOPHAGE) 500 MG tablet TAKE THREE TABLETS BY MOUTH IN THE AFTERNOON.  270 tablet  2  . simvastatin (ZOCOR) 40 MG tablet TAKE ONE TABLET BY MOUTH IN THE EVENING.  30 tablet  10   No current facility-administered medications on file prior to visit.    BP 130/70  Pulse 72  Temp(Src) 98.6 F (37 C)  Resp 16  Ht 5\' 7"  (1.702 m)  Wt 231 lb (104.781 kg)  BMI 36.17 kg/m2       Objective:    Physical Exam  Constitutional: She is oriented to person, place, and time. She appears well-developed and well-nourished. No distress.  HENT:  Head: Normocephalic and atraumatic.  Eyes: Conjunctivae and EOM are normal. Pupils are equal, round, and reactive to light.  Neck: Normal range of motion. Neck supple. No JVD present. No tracheal deviation present. No thyromegaly present.  Cardiovascular: Normal rate.   No murmur heard. Pulmonary/Chest: Effort normal and breath sounds normal. She has no wheezes. She exhibits no tenderness.  Abdominal: Soft. Bowel sounds are normal.  Musculoskeletal: Normal range of motion. She exhibits no edema and no tenderness.  Lymphadenopathy:    She has no cervical adenopathy.  Neurological: She is alert and oriented to person, place, and time. She has normal reflexes. No cranial nerve deficit.  Skin: Skin is warm and dry. She is not diaphoretic.  Psychiatric: She has a normal mood and affect. Her behavior is normal.          Assessment & Plan:  Patient is making progress in terms of control of alcohol.  Patient has a thyroid nodule that needs to be biopsied and this has been scheduled and follow up with the trauma surgeon patient had a breast biopsy yesterday and we are waiting the results of the pathology.  This was followed by [place] for diabetes and will check a hemoglobin A1c and repeat that today

## 2012-09-24 ENCOUNTER — Encounter: Payer: Self-pay | Admitting: Gastroenterology

## 2012-09-25 ENCOUNTER — Other Ambulatory Visit (HOSPITAL_COMMUNITY)
Admission: RE | Admit: 2012-09-25 | Discharge: 2012-09-25 | Disposition: A | Discharge status: Home / Self Care | Payer: PRIVATE HEALTH INSURANCE | Source: Ambulatory Visit | Attending: Interventional Radiology | Admitting: Interventional Radiology

## 2012-09-25 ENCOUNTER — Ambulatory Visit
Admission: RE | Admit: 2012-09-25 | Discharge: 2012-09-25 | Disposition: A | Discharge status: Home / Self Care | Payer: PRIVATE HEALTH INSURANCE | Source: Ambulatory Visit | Attending: Surgery | Admitting: Surgery

## 2012-09-25 DIAGNOSIS — E079 Disorder of thyroid, unspecified: Secondary | ICD-10-CM

## 2012-09-25 DIAGNOSIS — E041 Nontoxic single thyroid nodule: Secondary | ICD-10-CM | POA: Insufficient documentation

## 2012-09-26 ENCOUNTER — Telehealth (INDEPENDENT_AMBULATORY_CARE_PROVIDER_SITE_OTHER): Payer: Self-pay

## 2012-09-26 NOTE — Telephone Encounter (Signed)
Patient is going out of town this week. Please call her at either [phone] or [phone] when you receive path results.

## 2012-09-27 ENCOUNTER — Telehealth (INDEPENDENT_AMBULATORY_CARE_PROVIDER_SITE_OTHER): Payer: Self-pay | Admitting: General Surgery

## 2012-09-27 NOTE — Telephone Encounter (Signed)
LMOM for patient to call back and ask for [facility]. I wanted to let her know that her thyroid path report was normal. And keep her apt with Dr Magnus Ivan on 10-02-12 and he will go over more with her in the office

## 2012-09-27 NOTE — Telephone Encounter (Signed)
Patient called back and was given below message.

## 2012-09-28 ENCOUNTER — Encounter: Payer: Self-pay | Admitting: Internal Medicine

## 2012-10-01 ENCOUNTER — Other Ambulatory Visit: Payer: Self-pay | Admitting: Internal Medicine

## 2012-10-02 ENCOUNTER — Ambulatory Visit (INDEPENDENT_AMBULATORY_CARE_PROVIDER_SITE_OTHER): Payer: Managed Care, Other (non HMO) | Admitting: Surgery

## 2012-10-02 ENCOUNTER — Encounter (INDEPENDENT_AMBULATORY_CARE_PROVIDER_SITE_OTHER): Payer: Self-pay | Admitting: Surgery

## 2012-10-02 VITALS — BP 128/68 | HR 72 | Temp 97.4°F | Resp 18 | Ht 67.0 in | Wt 239.4 lb

## 2012-10-02 DIAGNOSIS — E041 Nontoxic single thyroid nodule: Secondary | ICD-10-CM

## 2012-10-02 NOTE — Progress Notes (Signed)
Subjective:     Patient ID: [name] [name], female   DOB: 1955/10/18, 57 y.o.   MRN: [id number]  HPI She is here for followup after biopsy of a thyroid nodule. She handled the biopsy well has no problems  Review of Systems     Objective:   Physical Exam On exam, there is no swelling of the neck and no ecchymosis  The final pathology showed benign follicular cells    Assessment:     Benign thyroid nodule     Plan:     At this point, and no surgery was recommended. I would recommend however a followup ultrasound in one year. I will see her back as needed

## 2012-10-04 ENCOUNTER — Encounter: Payer: Self-pay | Admitting: Internal Medicine

## 2012-11-07 ENCOUNTER — Telehealth: Payer: Self-pay | Admitting: Internal Medicine

## 2012-11-07 MED ORDER — LORAZEPAM 1 MG PO TABS: ORAL_TABLET | ORAL | Status: DC

## 2012-11-07 NOTE — Telephone Encounter (Signed)
Per dr Lovell Sheehan- ativan 1 mg 1 tid for 4 days, 1 bid for 4 days, and 1 aths for 4 days- sent to pharmacy and pt informed

## 2012-11-07 NOTE — Telephone Encounter (Signed)
Pt is trying to stop drinking(alcohol) and requesting a medication to help with withdrawals. [facility] [address]

## 2012-12-04 ENCOUNTER — Encounter: Payer: Self-pay | Admitting: Internal Medicine

## 2012-12-10 ENCOUNTER — Encounter: Payer: Self-pay | Admitting: Internal Medicine

## 2012-12-10 ENCOUNTER — Ambulatory Visit (AMBULATORY_SURGERY_CENTER): Payer: Self-pay | Admitting: *Deleted

## 2012-12-10 ENCOUNTER — Telehealth: Payer: Self-pay | Admitting: *Deleted

## 2012-12-10 VITALS — Ht 67.0 in | Wt 239.0 lb

## 2012-12-10 DIAGNOSIS — Z86010 Personal history of colonic polyps: Secondary | ICD-10-CM

## 2012-12-10 MED ORDER — NA SULFATE-K SULFATE-MG SULF 17.5-3.13-1.6 GM/177ML PO SOLN: 1.0000 | Freq: Once | ORAL | Status: DC

## 2012-12-10 NOTE — Progress Notes (Signed)
No allergies to eggs or soy. No problems with anesthesia.  

## 2012-12-10 NOTE — Telephone Encounter (Signed)
Dr Arlyce Dice: pt scheduled for direct colon 12/18/12.  Previous colonoscopy procedures 2011 and 2006 with Dr. Grayling Congress in [place].  Pt had polyps both times.  I have placed procedure reports on you desk for review.  She did not have pathology reports with her.  Release of information form signed and given to Merri Ray, CMA.

## 2012-12-11 NOTE — Telephone Encounter (Signed)
Records release faxed 

## 2012-12-12 NOTE — Telephone Encounter (Signed)
Based on the size of the polyp 5 year f/u should be sufficient, unless the pathology shows something ominous.  Therefore would put the procedure on hold.

## 2012-12-13 ENCOUNTER — Encounter: Payer: Self-pay | Admitting: Gastroenterology

## 2012-12-14 ENCOUNTER — Ambulatory Visit (INDEPENDENT_AMBULATORY_CARE_PROVIDER_SITE_OTHER): Payer: PRIVATE HEALTH INSURANCE

## 2012-12-14 DIAGNOSIS — Z23 Encounter for immunization: Secondary | ICD-10-CM | POA: Diagnosis not present

## 2012-12-18 ENCOUNTER — Ambulatory Visit (AMBULATORY_SURGERY_CENTER): Payer: PRIVATE HEALTH INSURANCE | Admitting: Gastroenterology

## 2012-12-18 ENCOUNTER — Encounter: Payer: Self-pay | Admitting: Gastroenterology

## 2012-12-18 VITALS — BP 140/71 | HR 73 | Temp 97.4°F | Resp 15 | Ht 67.0 in | Wt 239.0 lb

## 2012-12-18 DIAGNOSIS — K648 Other hemorrhoids: Secondary | ICD-10-CM

## 2012-12-18 DIAGNOSIS — Z86010 Personal history of colonic polyps: Secondary | ICD-10-CM

## 2012-12-18 DIAGNOSIS — D126 Benign neoplasm of colon, unspecified: Secondary | ICD-10-CM

## 2012-12-18 LAB — GLUCOSE, CAPILLARY
Glucose-Capillary: 111 mg/dL — ABNORMAL HIGH (ref 70–99)
Glucose-Capillary: 109 mg/dL — ABNORMAL HIGH (ref 70–99)

## 2012-12-18 MED ORDER — SODIUM CHLORIDE 0.9 % IV SOLN: 500.0000 mL | INTRAVENOUS | Status: DC

## 2012-12-18 NOTE — Progress Notes (Signed)
Called to room to assist during endoscopic procedure.  Patient ID and intended procedure confirmed with present staff. Received instructions for my participation in the procedure from the performing physician.Called to room to assist during endoscopic procedure.  Patient ID and intended procedure confirmed with present staff. Received instructions for my participation in the procedure from the performing physician. 

## 2012-12-18 NOTE — Op Note (Addendum)
[facility] [address] [place], [address]   COLONOSCOPY PROCEDURE REPORT  PATIENT: [name] [name], [name] [name]  MR#: [id number] BIRTHDATE: Dec 01, 1955 , 57  yrs. old GENDER: Female ENDOSCOPIST: Louis Meckel, MD REFERRED AO:ZHYQ Carolynn Sayers, M.D. PROCEDURE DATE:  12/18/2012 PROCEDURE:   Colonoscopy with snare polypectomy and Colonoscopy with cold biopsy polypectomy First Screening Colonoscopy - Avg.  risk and is 50 yrs.  old or older - No.  Prior Negative Screening - Now for repeat screening. N/A  History of Adenoma - Now for follow-up colonoscopy & has been > or = to 3 yrs.  Yes hx of adenoma.  Has been 3 or more years since last colonoscopy.  Polyps Removed Today? Yes. ASA CLASS:   Class II INDICATIONS:Patient's personal history of colon polyps. MEDICATIONS: MAC sedation, administered by CRNA and propofol (Diprivan) 400mg  IV  DESCRIPTION OF PROCEDURE:   After the risks benefits and alternatives of the procedure were thoroughly explained, informed consent was obtained.  A digital rectal exam revealed no abnormalities of the rectum.   The [id number]  endoscope was introduced through the anus and advanced to the cecum, which was identified by both the appendix and ileocecal valve. No adverse events experienced.   The quality of the prep was Suprep good  The instrument was then slowly withdrawn as the colon was fully examined.      COLON FINDINGS: A sessile polyp measuring 3 mm in size was found in the ascending colon.  A polypectomy was performed with a cold snare.  The resection was complete and the polyp tissue was completely retrieved.   Two sessile polyps measuring 2 mm in size were found in the distal transverse colon.  A polypectomy was performed with cold forceps.   A sessile polyp measuring 3 mm in size was found in the distal transverse colon.  A polypectomy was performed with a cold snare.  The resection was complete and the polyp tissue was  completely retrieved.   Internal hemorrhoids were found.  Retroflexed views revealed no abnormalities. The time to cecum=4 minutes 45 seconds.  Withdrawal time=9 minutes 53 seconds. The scope was withdrawn and the procedure completed. COMPLICATIONS: There were no complications.  ENDOSCOPIC IMPRESSION: 1.   Sessile polyp measuring 3 mm in size was found in the ascending colon; polypectomy was performed with a cold snare 2.   Two sessile polyps measuring 2 mm in size were found in the distal transverse colon; polypectomy was performed with cold forceps 3.   Sessile polyp measuring 3 mm in size was found in the distal transverse colon; polypectomy was performed with a cold snare 4.   Internal hemorrhoids  RECOMMENDATIONS: If the polyp(s) removed today are proven to be adenomatous (pre-cancerous) polyps, you will need a repeat colonoscopy in 5 years.  Otherwise you should continue to follow colorectal cancer screening guidelines for "routine risk" patients with colonoscopy in 10 years.  You will receive a letter within 1-2 weeks with the results of your biopsy as well as final recommendations.  Please call my office if you have not received a letter after 3 weeks.   eSigned:  Louis Meckel, MD 12/18/2012 8:45 AM Revised: 12/18/2012 8:45 AM  cc:   PATIENT NAME:  [place], Lapaglia MR#: [id number]

## 2012-12-18 NOTE — Progress Notes (Signed)
Patient did not have preoperative order for IV antibiotic SSI prophylaxis. (G8918)  Patient did not experience any of the following events: a burn prior to discharge; a fall within the facility; wrong site/side/patient/procedure/implant event; or a hospital transfer or hospital admission upon discharge from the facility. (G8907)  

## 2012-12-18 NOTE — Progress Notes (Signed)
Lidocaine-40mg IV prior to Propofol InductionPropofol given over incremental dosages 

## 2012-12-18 NOTE — Patient Instructions (Signed)
YOU HAD AN ENDOSCOPIC PROCEDURE TODAY AT THE [facility]: Refer to the procedure report that was given to you for any specific questions about what was found during the examination.  If the procedure report does not answer your questions, please call your gastroenterologist to clarify.  If you requested that your care partner not be given the details of your procedure findings, then the procedure report has been included in a sealed envelope for you to review at your convenience later.  YOU SHOULD EXPECT: Some feelings of bloating in the abdomen. Passage of more gas than usual.  Walking can help get rid of the air that was put into your GI tract during the procedure and reduce the bloating. If you had a lower endoscopy (such as a colonoscopy or flexible sigmoidoscopy) you may notice spotting of blood in your stool or on the toilet paper. If you underwent a bowel prep for your procedure, then you may not have a normal bowel movement for a few days.  DIET: Your first meal following the procedure should be a light meal and then it is ok to progress to your normal diet.  A half-sandwich or bowl of soup is an example of a good first meal.  Heavy or fried foods are harder to digest and may make you feel nauseous or bloated.  Likewise meals heavy in dairy and vegetables can cause extra gas to form and this can also increase the bloating.  Drink plenty of fluids but you should avoid alcoholic beverages for 24 hours.  ACTIVITY: Your care partner should take you home directly after the procedure.  You should plan to take it easy, moving slowly for the rest of the day.  You can resume normal activity the day after the procedure however you should NOT DRIVE or use heavy machinery for 24 hours (because of the sedation medicines used during the test).    SYMPTOMS TO REPORT IMMEDIATELY: A gastroenterologist can be reached at any hour.  During normal business hours, 8:30 AM to 5:00 PM Monday through Friday,  call [phone].  After hours and on weekends, please call the GI answering service at [phone] who will take a message and have the physician on call contact you.   Following lower endoscopy (colonoscopy or flexible sigmoidoscopy):  Excessive amounts of blood in the stool  Significant tenderness or worsening of abdominal pains  Swelling of the abdomen that is new, acute  Fever of 100F or higher    FOLLOW UP: If any biopsies were taken you will be contacted by phone or by letter within the next 1-3 weeks.  Call your gastroenterologist if you have not heard about the biopsies in 3 weeks.  Our staff will call the home number listed on your records the next business day following your procedure to check on you and address any questions or concerns that you may have at that time regarding the information given to you following your procedure. This is a courtesy call and so if there is no answer at the home number and we have not heard from you through the emergency physician on call, we will assume that you have returned to your regular daily activities without incident.  SIGNATURES/CONFIDENTIALITY: You and/or your care partner have signed paperwork which will be entered into your electronic medical record.  These signatures attest to the fact that that the information above on your After Visit Summary has been reviewed and is understood.  Full responsibility of the confidentiality   of this discharge information lies with you and/or your care-partner.     

## 2012-12-19 ENCOUNTER — Telehealth: Payer: Self-pay | Admitting: *Deleted

## 2012-12-19 NOTE — Telephone Encounter (Signed)
  Follow up Call-  Call back number 12/18/2012  Post procedure Call Back phone  # [phone]  Permission to leave phone message Yes     Patient questions:  Do you have a fever, pain , or abdominal swelling? no Pain Score  0 *  Have you tolerated food without any problems? yes  Have you been able to return to your normal activities? yes  Do you have any questions about your discharge instructions: Diet   no Medications  no Follow up visit  no  Do you have questions or concerns about your Care? no  Actions: * If pain score is 4 or above: No action needed, pain <4.

## 2012-12-20 NOTE — Telephone Encounter (Signed)
Patient has had procedure No Pathology report could be obtained We have colon report but no path. Will send down to be scanned in to [facility]

## 2012-12-26 ENCOUNTER — Encounter: Payer: Self-pay | Admitting: Gastroenterology

## 2012-12-31 ENCOUNTER — Other Ambulatory Visit (INDEPENDENT_AMBULATORY_CARE_PROVIDER_SITE_OTHER): Payer: PRIVATE HEALTH INSURANCE

## 2012-12-31 DIAGNOSIS — E039 Hypothyroidism, unspecified: Secondary | ICD-10-CM

## 2012-12-31 DIAGNOSIS — E119 Type 2 diabetes mellitus without complications: Secondary | ICD-10-CM

## 2012-12-31 LAB — TSH: TSH: 15.01 u[IU]/mL — ABNORMAL HIGH (ref 0.35–5.50)

## 2012-12-31 LAB — HEMOGLOBIN A1C: Hgb A1c MFr Bld: 5.2 % (ref 4.6–6.5)

## 2013-01-04 ENCOUNTER — Encounter: Payer: Self-pay | Admitting: *Deleted

## 2013-01-07 ENCOUNTER — Ambulatory Visit (INDEPENDENT_AMBULATORY_CARE_PROVIDER_SITE_OTHER): Payer: PRIVATE HEALTH INSURANCE | Admitting: Internal Medicine

## 2013-01-07 ENCOUNTER — Encounter: Payer: Self-pay | Admitting: Internal Medicine

## 2013-01-07 VITALS — BP 120/70 | HR 76 | Temp 98.2°F | Resp 16 | Ht 67.0 in | Wt 236.0 lb

## 2013-01-07 DIAGNOSIS — F329 Major depressive disorder, single episode, unspecified: Secondary | ICD-10-CM

## 2013-01-07 MED ORDER — ESCITALOPRAM OXALATE 20 MG PO TABS: 20.0000 mg | ORAL_TABLET | Freq: Every day | ORAL | Status: DC

## 2013-01-07 MED ORDER — SIMVASTATIN 40 MG PO TABS: 40.0000 mg | ORAL_TABLET | Freq: Every day | ORAL | Status: DC

## 2013-01-07 MED ORDER — LEVOTHYROXINE SODIUM 150 MCG PO TABS: 150.0000 ug | ORAL_TABLET | Freq: Every day | ORAL | Status: DC

## 2013-01-07 NOTE — Progress Notes (Signed)
Subjective:    Patient ID: [name] [name], female    DOB: 22-May-1955, 57 y.o.   MRN: [id number]  Diabetes Pertinent negatives for hypoglycemia include no dizziness or headaches. Pertinent negatives for diabetes include no fatigue and no weakness.  Hypertension Pertinent negatives include no headaches, neck pain or shortness of breath.  Hyperlipidemia Pertinent negatives include no myalgias or shortness of breath.   Breast biopsy and thyroid bx were normal per patient DM follow up CBG's not monitored Last A1c 5.2   Review of Systems  Constitutional: Negative for activity change, appetite change and fatigue.  HENT: Negative for congestion, ear pain, postnasal drip and sinus pressure.   Eyes: Negative for redness and visual disturbance.  Respiratory: Negative for cough, shortness of breath and wheezing.   Gastrointestinal: Negative for abdominal pain and abdominal distention.  Genitourinary: Negative for dysuria, frequency and menstrual problem.  Musculoskeletal: Negative for arthralgias, joint swelling, myalgias and neck pain.  Skin: Negative for rash and wound.  Neurological: Negative for dizziness, weakness and headaches.  Hematological: Negative for adenopathy. Does not bruise/bleed easily.  Psychiatric/Behavioral: Negative for sleep disturbance and decreased concentration.   Past Medical History  Diagnosis Date  . Depression   . GERD (gastroesophageal reflux disease)   . Hypertension   . Hyperlipidemia   . Colon polyps   . Thyroid disease   . History of recurrent UTIs   . Anemia     menstrual related  . Arthritis     right ankle  . Diabetes mellitus 12/2009    type 2  . Alcohol problem drinking     rehab  . Acute meniscal tear of knee     Left knee    History   Social History  . Marital Status: Married    Spouse Name: N/A    Number of Children: N/A  . Years of Education: N/A   Occupational History  . Not on file.   Social History Main Topics  . Smoking  status: Former Smoker    Quit date: 02/21/2009  . Smokeless tobacco: Never Used  . Alcohol Use: 12.0 oz/week    20 Glasses of wine per week  . Drug Use: No  . Sexual Activity: Yes   Other Topics Concern  . Not on file   Social History Narrative  . No narrative on file    Past Surgical History  Procedure Laterality Date  . Total knee arthroplasty  2009    right  . Arthroscopy left knee  03/11/2011    Surgery was done in [place] for a meniscal tear  . Cesarean section  1987  . Cholecystectomy    . Tubal ligation  1987    Family History  Problem Relation Age of Onset  . Adopted: Yes  . COPD Mother   . Alcohol abuse Father     Allergies  Allergen Reactions  . Adhesive [Tape]     blisters    Current Outpatient Prescriptions on File Prior to Visit  Medication Sig Dispense Refill  . amLODipine-benazepril (LOTREL) 5-10 MG per capsule TAKE ONE CAPSULE BY MOUTH ONCE DAILY.  90 capsule  2  . B Complex Vitamins (B COMPLEX 100 PO) Take 1 tablet by mouth 2 (two) times daily.      Marland Kitchen BLACK COHOSH PO Take by mouth 3 (three) times daily.      . cetirizine (ZYRTEC) 10 MG tablet Take 1 tablet (10 mg total) by mouth daily.  90 tablet  3  . Cholecalciferol (VITAMIN D3)  2000 UNITS TABS Take by mouth. 2500iu daily       . escitalopram (LEXAPRO) 20 MG tablet Take 1 tablet (20 mg total) by mouth daily.  30 tablet  11  . ezetimibe (ZETIA) 10 MG tablet Take 1 tablet (10 mg total) by mouth daily.  90 tablet  3  . glipiZIDE (GLUCOTROL) 5 MG tablet TAKE ONE TABLET BY MOUTH ONCE DAILY.  90 tablet  2  . ibuprofen (ADVIL,MOTRIN) 800 MG tablet Take 800 mg by mouth every 8 (eight) hours as needed for pain.      Marland Kitchen levothyroxine (SYNTHROID, LEVOTHROID) 150 MCG tablet Take 1 tablet (150 mcg total) by mouth daily.  30 tablet  3  . LORazepam (ATIVAN) 1 MG tablet 1 tid for 4 days, 1 bid for 4 days and 1 qhs  For 4 days and then stop  20 tablet  1  . metFORMIN (GLUCOPHAGE) 500 MG tablet TAKE THREE TABLETS BY  MOUTH IN THE AFTERNOON.  270 tablet  2  . omeprazole (PRILOSEC) 20 MG capsule Take 20 mg by mouth 2 (two) times daily.      . simvastatin (ZOCOR) 40 MG tablet TAKE ONE TABLET BY MOUTH IN THE EVENING.  30 tablet  10   No current facility-administered medications on file prior to visit.    BP 120/70  Pulse 76  Temp(Src) 98.2 F (36.8 C)  Resp 16  Ht 5\' 7"  (1.702 m)  Wt 236 lb (107.049 kg)  BMI 36.95 kg/m2       Objective:   Physical Exam  Nursing note and vitals reviewed. Constitutional: She appears well-developed and well-nourished.  HENT:  Head: Normocephalic.  Eyes: Conjunctivae are normal. Pupils are equal, round, and reactive to light.  Neck: Normal range of motion. Neck supple.  Cardiovascular: Normal rate and regular rhythm.   Murmur heard. Abdominal: Bowel sounds are normal.  Musculoskeletal: Normal range of motion.  Skin: Skin is warm and dry.  Psychiatric: She has a normal mood and affect. Her behavior is normal.          Assessment & Plan:  DM with stable improved A1c Still having issues with ETOH Stress with brother Discussion of therapy Dr Rennis Chris is seeing for rotator cuff

## 2013-01-07 NOTE — Progress Notes (Signed)
Pre-visit discussion using our clinic review tool. No additional management support is needed unless otherwise documented below in the visit note.  

## 2013-01-07 NOTE — Patient Instructions (Signed)
Talk to therapist whether we could try vivtrol ( naltrexone)

## 2013-01-11 ENCOUNTER — Encounter: Payer: Self-pay | Admitting: *Deleted

## 2013-01-14 NOTE — Telephone Encounter (Signed)
error 

## 2013-01-21 ENCOUNTER — Encounter: Payer: Self-pay | Admitting: Internal Medicine

## 2013-01-23 ENCOUNTER — Other Ambulatory Visit: Payer: Self-pay | Admitting: *Deleted

## 2013-01-23 MED ORDER — LORAZEPAM 1 MG PO TABS: ORAL_TABLET | ORAL | Status: DC

## 2013-01-23 MED ORDER — SIMVASTATIN 40 MG PO TABS: 40.0000 mg | ORAL_TABLET | Freq: Every day | ORAL | Status: DC

## 2013-01-23 MED ORDER — AMLODIPINE BESY-BENAZEPRIL HCL 5-10 MG PO CAPS: ORAL_CAPSULE | ORAL | Status: DC

## 2013-02-22 ENCOUNTER — Other Ambulatory Visit: Payer: Self-pay | Admitting: *Deleted

## 2013-02-22 ENCOUNTER — Encounter: Payer: Self-pay | Admitting: Internal Medicine

## 2013-02-22 MED ORDER — METFORMIN HCL 500 MG PO TABS: ORAL_TABLET | ORAL | Status: DC

## 2013-04-10 ENCOUNTER — Other Ambulatory Visit: Payer: Self-pay | Admitting: Internal Medicine

## 2013-05-01 ENCOUNTER — Telehealth: Payer: Self-pay | Admitting: Internal Medicine

## 2013-05-01 DIAGNOSIS — E785 Hyperlipidemia, unspecified: Secondary | ICD-10-CM

## 2013-05-01 DIAGNOSIS — E039 Hypothyroidism, unspecified: Secondary | ICD-10-CM

## 2013-05-01 DIAGNOSIS — T887XXA Unspecified adverse effect of drug or medicament, initial encounter: Secondary | ICD-10-CM

## 2013-05-01 NOTE — Telephone Encounter (Signed)
Pt is needing an order for blood work, states dr.jenkins normally takes care of it during her visits however he did not scheduled it at her last office visit.

## 2013-05-01 NOTE — Telephone Encounter (Signed)
Reviewed chart and I didn't see anything on needing labs, please advise

## 2013-05-03 NOTE — Telephone Encounter (Signed)
Future order placed, please schedule lab appt

## 2013-05-03 NOTE — Telephone Encounter (Signed)
Lipid 272.4 liver 995.20 tsh 244.9

## 2013-05-05 ENCOUNTER — Encounter: Payer: Self-pay | Admitting: Internal Medicine

## 2013-05-09 NOTE — Telephone Encounter (Signed)
Left msg for pt to call and schedule appt for labs

## 2013-05-10 MED ORDER — DIAZEPAM 2 MG PO TABS: 4.0000 mg | ORAL_TABLET | Freq: Two times a day (BID) | ORAL | Status: DC | PRN

## 2013-05-10 MED ORDER — ZALEPLON 10 MG PO CAPS: 10.0000 mg | ORAL_CAPSULE | Freq: Every evening | ORAL | Status: DC | PRN

## 2013-05-10 MED ORDER — LEVOTHYROXINE SODIUM 150 MCG PO TABS: 150.0000 ug | ORAL_TABLET | Freq: Every day | ORAL | Status: DC

## 2013-05-10 MED ORDER — AMLODIPINE BESY-BENAZEPRIL HCL 5-10 MG PO CAPS: ORAL_CAPSULE | ORAL | Status: DC

## 2013-05-10 MED ORDER — LORAZEPAM 1 MG PO TABS: ORAL_TABLET | ORAL | Status: DC

## 2013-05-10 NOTE — Addendum Note (Signed)
Addended by: Townsend Roger D on: 05/10/2013 02:36 PM   Modules accepted: Orders

## 2013-05-27 ENCOUNTER — Other Ambulatory Visit (INDEPENDENT_AMBULATORY_CARE_PROVIDER_SITE_OTHER): Payer: PRIVATE HEALTH INSURANCE

## 2013-05-27 DIAGNOSIS — E039 Hypothyroidism, unspecified: Secondary | ICD-10-CM

## 2013-05-27 DIAGNOSIS — T887XXA Unspecified adverse effect of drug or medicament, initial encounter: Secondary | ICD-10-CM

## 2013-05-27 DIAGNOSIS — E785 Hyperlipidemia, unspecified: Secondary | ICD-10-CM

## 2013-05-27 LAB — HEPATIC FUNCTION PANEL
ALT: 22 U/L (ref 0–35)
AST: 32 U/L (ref 0–37)
Albumin: 3.6 g/dL (ref 3.5–5.2)
Alkaline Phosphatase: 60 U/L (ref 39–117)
Bilirubin, Direct: 0.4 mg/dL — ABNORMAL HIGH (ref 0.0–0.3)
Total Bilirubin: 1.5 mg/dL — ABNORMAL HIGH (ref 0.3–1.2)
Total Protein: 6.9 g/dL (ref 6.0–8.3)

## 2013-05-27 LAB — LIPID PANEL
Cholesterol: 92 mg/dL (ref 0–200)
HDL: 31.9 mg/dL — ABNORMAL LOW (ref >39.00)
LDL Cholesterol: 45 mg/dL (ref 0–99)
Total CHOL/HDL Ratio: 3
Triglycerides: 78 mg/dL (ref 0.0–149.0)
VLDL: 15.6 mg/dL (ref 0.0–40.0)

## 2013-05-27 LAB — TSH: TSH: 0.22 u[IU]/mL — ABNORMAL LOW (ref 0.35–5.50)

## 2013-06-03 ENCOUNTER — Ambulatory Visit (INDEPENDENT_AMBULATORY_CARE_PROVIDER_SITE_OTHER): Payer: PRIVATE HEALTH INSURANCE | Admitting: Internal Medicine

## 2013-06-03 ENCOUNTER — Encounter: Payer: Self-pay | Admitting: Internal Medicine

## 2013-06-03 VITALS — BP 116/50 | HR 79 | Temp 98.2°F | Ht 67.0 in | Wt 234.0 lb

## 2013-06-03 DIAGNOSIS — IMO0001 Reserved for inherently not codable concepts without codable children: Secondary | ICD-10-CM

## 2013-06-03 DIAGNOSIS — E1165 Type 2 diabetes mellitus with hyperglycemia: Secondary | ICD-10-CM

## 2013-06-03 DIAGNOSIS — R413 Other amnesia: Secondary | ICD-10-CM

## 2013-06-03 MED ORDER — SITAGLIP PHOS-METFORMIN HCL ER 50-1000 MG PO TB24: 1.0000 | ORAL_TABLET | Freq: Every day | ORAL | Status: DC

## 2013-06-03 MED ORDER — LEVOTHYROXINE SODIUM 137 MCG PO TABS: 137.0000 ug | ORAL_TABLET | Freq: Every day | ORAL | Status: DC

## 2013-06-03 MED ORDER — VITAMIN B-1 50 MG PO TABS: 50.0000 mg | ORAL_TABLET | Freq: Every day | ORAL | Status: DC

## 2013-06-03 NOTE — Progress Notes (Signed)
Pre visit review using our clinic review tool, if applicable. No additional management support is needed unless otherwise documented below in the visit note. 

## 2013-06-03 NOTE — Progress Notes (Signed)
Subjective:    Patient ID: [name] [name], female    DOB: 1955/06/23, 58 y.o.   MRN: [id number]  Diabetes Associated symptoms include fatigue.  Hypertension    Has been doing well with sobriety Has gained weight and has noted increased CBGs No weight gain noted on scale reveiw CBG's have been variable and has  Noted hypoglycemia  Review of Systems  Constitutional: Positive for activity change, appetite change and fatigue.  HENT: Negative.   Respiratory: Negative.   Cardiovascular: Negative.   Gastrointestinal: Negative.    Past Medical History  Diagnosis Date  . Depression   . GERD (gastroesophageal reflux disease)   . Hypertension   . Hyperlipidemia   . Colon polyps   . Thyroid disease   . History of recurrent UTIs   . Anemia     menstrual related  . Arthritis     right ankle  . Diabetes mellitus 12/2009    type 2  . Alcohol problem drinking     rehab  . Acute meniscal tear of knee     Left knee    History   Social History  . Marital Status: Married    Spouse Name: N/A    Number of Children: N/A  . Years of Education: N/A   Occupational History  . Not on file.   Social History Main Topics  . Smoking status: Former Smoker    Quit date: 02/21/2009  . Smokeless tobacco: Never Used  . Alcohol Use: 12.0 oz/week    20 Glasses of wine per week  . Drug Use: No  . Sexual Activity: Yes   Other Topics Concern  . Not on file   Social History Narrative  . No narrative on file    Past Surgical History  Procedure Laterality Date  . Total knee arthroplasty  2009    right  . Arthroscopy left knee  03/11/2011    Surgery was done in [place] for a meniscal tear  . Cesarean section  1987  . Cholecystectomy    . Tubal ligation  1987    Family History  Problem Relation Age of Onset  . Adopted: Yes  . COPD Mother   . Alcohol abuse Father     Allergies  Allergen Reactions  . Adhesive [Tape]     blisters    Current Outpatient Prescriptions on File  Prior to Visit  Medication Sig Dispense Refill  . amLODipine-benazepril (LOTREL) 5-10 MG per capsule TAKE ONE CAPSULE BY MOUTH ONCE DAILY.  90 capsule  3  . cetirizine (ZYRTEC) 10 MG tablet Take 1 tablet (10 mg total) by mouth daily.  90 tablet  3  . diazepam (VALIUM) 2 MG tablet Take 2 tablets (4 mg total) by mouth 2 (two) times daily as needed for anxiety.  30 tablet  5  . escitalopram (LEXAPRO) 20 MG tablet Take 1 tablet (20 mg total) by mouth daily.  90 tablet  3  . ezetimibe (ZETIA) 10 MG tablet Take 1 tablet (10 mg total) by mouth daily.  90 tablet  3  . ibuprofen (ADVIL,MOTRIN) 800 MG tablet Take 800 mg by mouth every 8 (eight) hours as needed for pain.      Marland Kitchen levothyroxine (SYNTHROID, LEVOTHROID) 150 MCG tablet Take 1 tablet (150 mcg total) by mouth daily.  90 tablet  3  . LORazepam (ATIVAN) 1 MG tablet 1 AT BEDTIME AS NEEDED  30 tablet  5  . omeprazole (PRILOSEC) 20 MG capsule Take 20 mg by mouth  2 (two) times daily.      . simvastatin (ZOCOR) 40 MG tablet Take 1 tablet (40 mg total) by mouth daily at 6 PM.  90 tablet  3  . zaleplon (SONATA) 10 MG capsule Take 1 capsule (10 mg total) by mouth at bedtime as needed for sleep.  30 capsule  5  . B Complex Vitamins (B COMPLEX 100 PO) Take 1 tablet by mouth 2 (two) times daily.      Marland Kitchen BLACK COHOSH PO Take by mouth 3 (three) times daily.      . Cholecalciferol (VITAMIN D3) 2000 UNITS TABS Take by mouth. 2500iu daily        No current facility-administered medications on file prior to visit.    BP 116/50  Pulse 79  Temp(Src) 98.2 F (36.8 C) (Oral)  Ht 5\' 7"  (1.702 m)  Wt 234 lb (106.142 kg)  BMI 36.64 kg/m2  SpO2 97%       Objective:   Physical Exam  Nursing note and vitals reviewed. Constitutional: She appears well-developed and well-nourished.  HENT:  Head: Normocephalic and atraumatic.  Eyes: Conjunctivae are normal. Pupils are equal, round, and reactive to light.  Cardiovascular: Normal rate and regular rhythm.   Murmur  heard. Pulmonary/Chest: Effort normal and breath sounds normal.  Abdominal: Soft. Bowel sounds are normal.          Assessment & Plan:  Because of her hypoglycemia we will decrease the metformin component and discontinue the glucotrol Will begin Janumet 50/1000 XL  Monitor A1c and bmet Adjust synthroid  If a1c is greated than 7.5 increased janumet to 1000/100  Add thiamine for memory

## 2013-06-03 NOTE — Patient Instructions (Signed)
The patient is instructed to continue all medications as prescribed. Schedule followup with check out clerk upon leaving the clinic  

## 2013-06-17 ENCOUNTER — Telehealth: Payer: Self-pay

## 2013-06-17 NOTE — Telephone Encounter (Signed)
Relevant patient education assigned to patient using Emmi. ° °

## 2013-06-27 ENCOUNTER — Other Ambulatory Visit: Payer: Self-pay | Admitting: Internal Medicine

## 2013-06-28 ENCOUNTER — Ambulatory Visit (INDEPENDENT_AMBULATORY_CARE_PROVIDER_SITE_OTHER): Payer: PRIVATE HEALTH INSURANCE | Admitting: Physician Assistant

## 2013-06-28 ENCOUNTER — Encounter: Payer: Self-pay | Admitting: Physician Assistant

## 2013-06-28 VITALS — BP 130/60 | HR 89 | Temp 97.6°F | Resp 18 | Ht 67.0 in | Wt 241.0 lb

## 2013-06-28 DIAGNOSIS — Z09 Encounter for follow-up examination after completed treatment for conditions other than malignant neoplasm: Secondary | ICD-10-CM

## 2013-06-28 DIAGNOSIS — L0291 Cutaneous abscess, unspecified: Secondary | ICD-10-CM

## 2013-06-28 DIAGNOSIS — E039 Hypothyroidism, unspecified: Secondary | ICD-10-CM

## 2013-06-28 DIAGNOSIS — L0390 Cellulitis, unspecified: Secondary | ICD-10-CM

## 2013-06-28 DIAGNOSIS — E119 Type 2 diabetes mellitus without complications: Secondary | ICD-10-CM

## 2013-06-28 MED ORDER — COLCHICINE 0.6 MG PO TABS: 0.6000 mg | ORAL_TABLET | Freq: Every day | ORAL | Status: DC

## 2013-06-28 MED ORDER — SULFAMETHOXAZOLE-TMP DS 800-160 MG PO TABS: 1.0000 | ORAL_TABLET | Freq: Two times a day (BID) | ORAL | Status: DC

## 2013-06-28 MED ORDER — EZETIMIBE 10 MG PO TABS: 10.0000 mg | ORAL_TABLET | Freq: Every day | ORAL | Status: DC

## 2013-06-28 MED ORDER — SITAGLIP PHOS-METFORMIN HCL ER 50-1000 MG PO TB24: 1.0000 | ORAL_TABLET | Freq: Every day | ORAL | Status: DC

## 2013-06-28 MED ORDER — GLIMEPIRIDE 1 MG PO TABS: 1.0000 mg | ORAL_TABLET | Freq: Every day | ORAL | Status: DC

## 2013-06-28 MED ORDER — SITAGLIP PHOS-METFORMIN HCL ER 50-1000 MG PO TB24: 2.0000 | ORAL_TABLET | Freq: Every day | ORAL | Status: DC

## 2013-06-28 NOTE — Patient Instructions (Addendum)
Continue Janumet XR once per day. Add Amaryl 1mg  daily to bring Finger stick Blood Glucose down. Monitor Blood sugar frequently.  Bactrim DS twice daily for 10 days for cellulitis.  Refilled your Zetia.  Followup in one week to reassess.  Cellulitis Cellulitis is an infection of the skin and the tissue under the skin. The infected area is usually red and tender. This happens most often in the arms and lower legs. HOME CARE   Take your antibiotic medicine as told. Finish the medicine even if you start to feel better.  Keep the infected arm or leg raised (elevated).  Put a warm cloth on the area up to 4 times per day.  Only take medicines as told by your doctor.  Keep all doctor visits as told. GET HELP RIGHT AWAY IF:   You have a fever.  You feel very sleepy.  You throw up (vomit) or have watery poop (diarrhea).  You feel sick and have muscle aches and pains.  You see red streaks on the skin coming from the infected area.  Your red area gets bigger or turns a dark color.  Your bone or joint under the infected area is painful after the skin heals.  Your infection comes back in the same area or different area.  You have a puffy (swollen) bump in the infected area.  You have new symptoms. MAKE SURE YOU:   Understand these instructions.  Will watch your condition.  Will get help right away if you are not doing well or get worse. Document Released: 07/27/2007 Document Revised: 08/09/2011 Document Reviewed: 04/25/2011 [facility] Patient Information 2014 [place], [place].

## 2013-06-28 NOTE — Progress Notes (Signed)
Subjective:    Patient ID: [name] [name], female    DOB: [birth year], 58 y.o.   MRN: [id number]  Diabetes   Patient is a 58 year old Caucasian female with a history of hypothyroidism and diabetes presenting to the clinic for followup of medication change. She recently had her Synthroid decreased from 150 mcg to 137 mcg and was taken off of glipizide and metformin and placed instead on Janumet XR. Patient states that she is tolerating the medications well. She does state that she has been feeling more tired than usual since changing the medications. She also has noticed quite a bit of swelling in both of her legs and feet, which is helped by elevating her legs, that and cause some pain to to the tightness from the swelling. She also has a bit of redness and pain to the touch of her right foot today. She hadn't noticed this before today, but she states that it may be gout in that she has had a high uric acid level in the past. She states that she has been checking her fingerstick blood glucose more frequently since changing medications and that has been consistently in the 200s. She states that in the past her blood sugars have fluctuated from low to high greatly. She denies fevers, chills, nausea, vomiting, diarrhea, shortness of breath, chest pain, dizziness, numbness, and tingling.   Review of Systems As per the history of present illness and are otherwise negative.   Past Medical History  Diagnosis Date  . Depression   . GERD (gastroesophageal reflux disease)   . Hypertension   . Hyperlipidemia   . Colon polyps   . Thyroid disease   . History of recurrent UTIs   . Anemia     menstrual related  . Arthritis     right ankle  . Diabetes mellitus 12/2009    type 2  . Alcohol problem drinking     rehab  . Acute meniscal tear of knee     Left knee   Past Surgical History  Procedure Laterality Date  . Total knee arthroplasty  2009    right  . Arthroscopy left knee  03/11/2011   Surgery was done in [place] for a meniscal tear  . Cesarean section  1987  . Cholecystectomy    . Tubal ligation  1987    reports that she quit smoking about 4 years ago. She has never used smokeless tobacco. She reports that she drinks about 12 ounces of alcohol per week. She reports that she does not use illicit drugs. family history includes Alcohol abuse in her father; COPD in her mother. She was adopted. Allergies  Allergen Reactions  . Adhesive [Tape]     blisters   No change in PFS history since last office visit on 06/03/2013.      Objective:   Physical Exam  Nursing note and vitals reviewed. Constitutional: She is oriented to person, place, and time. She appears well-developed and well-nourished. No distress.  HENT:  Head: Normocephalic and atraumatic.  Eyes: Conjunctivae and EOM are normal. Pupils are equal, round, and reactive to light.  Neck: Normal range of motion. Neck supple.  Cardiovascular: Normal rate, regular rhythm, normal heart sounds and intact distal pulses.  Exam reveals no gallop and no friction rub.   No murmur heard. Pulmonary/Chest: Effort normal and breath sounds normal. No respiratory distress. She has no wheezes. She has no rales. She exhibits no tenderness.  Musculoskeletal: Normal range of motion. She exhibits edema (1+  pitting edema in bilateral lower legs.) and tenderness (Dorsal aspect of right foot tender to palpation.).  Neurological: She is alert and oriented to person, place, and time. She has normal reflexes. She displays normal reflexes. She exhibits normal muscle tone.  Sensation grossly intact in lower extremities.   Skin: Skin is warm and dry. No rash noted. She is not diaphoretic. There is erythema. No pallor.  Right Foot: The dorsal aspect is diffusely erythematous. It is also tender to palpation, and there is 1+ pitting edema.  Psychiatric: She has a normal mood and affect. Her behavior is normal. Judgment and thought content normal.    Filed Vitals:   06/28/13 1549  BP: 130/60  Pulse: 89  Temp: 97.6 F (36.4 C)  Resp: 18    Lab Results  Component Value Date   WBC 5.2 08/08/2012   HGB 13.5 08/08/2012   HCT 40.3 08/08/2012   PLT 195.0 08/08/2012   GLUCOSE 78 04/20/2012   CHOL 92 05/27/2013   TRIG 78.0 05/27/2013   HDL 31.90* 05/27/2013   LDLDIRECT 135.1 12/28/2010   LDLCALC 45 05/27/2013   ALT 22 05/27/2013   AST 32 05/27/2013   NA 135 04/20/2012   K 3.5 04/20/2012   CL 97 04/20/2012   CREATININE 0.7 04/20/2012   BUN 6 04/20/2012   CO2 26 04/20/2012   TSH 0.22* 05/27/2013   INR 1.2* 04/12/2010   HGBA1C 5.2 12/31/2012   MICROALBUR 2.1* 04/20/2012      Assessment & Plan:  Dyonna was seen today for follow-up, diabetes and hypothyroidism.  Diagnoses and associated orders for this visit:  Type II or unspecified type diabetes mellitus without mention of complication, not stated as uncontrolled - SitaGLIPtin-MetFORMIN HCl (JANUMET XR) 50-1000 MG TB24; Take 1 tablet by mouth daily. - glimepiride (AMARYL) 1 MG tablet; Take 1 tablet (1 mg total) by mouth daily with breakfast.  Hypothyroid - Continue current therapy.  Follow up - Overall, doing well with chronic illness medication. Tolerating mediation well.  Cellulitis - sulfamethoxazole-trimethoprim (BACTRIM DS) 800-160 MG per tablet; Take 1 tablet by mouth 2 (two) times daily.  Other Orders - ezetimibe (ZETIA) 10 MG tablet; Take 1 tablet (10 mg total) by mouth daily.   Follow up in 1 week to reassess.   Patient Instructions  Continue Janumet XR once per day. Add Amaryl 1mg  daily to bring Finger stick Blood Glucose down. Monitor Blood sugar frequently.  Bactrim DS twice daily for 10 days for cellulitis.  Refilled your Zetia.  Followup in one week to reassess.

## 2013-06-28 NOTE — Progress Notes (Signed)
Pre visit review using our clinic review tool, if applicable. No additional management support is needed unless otherwise documented below in the visit note. 

## 2013-07-05 ENCOUNTER — Ambulatory Visit (INDEPENDENT_AMBULATORY_CARE_PROVIDER_SITE_OTHER): Payer: No Typology Code available for payment source | Admitting: Physician Assistant

## 2013-07-05 ENCOUNTER — Encounter: Payer: Self-pay | Admitting: Physician Assistant

## 2013-07-05 VITALS — BP 138/70 | HR 94 | Temp 98.9°F | Resp 18 | Wt 238.0 lb

## 2013-07-05 DIAGNOSIS — L0291 Cutaneous abscess, unspecified: Secondary | ICD-10-CM

## 2013-07-05 DIAGNOSIS — I10 Essential (primary) hypertension: Secondary | ICD-10-CM

## 2013-07-05 DIAGNOSIS — Z09 Encounter for follow-up examination after completed treatment for conditions other than malignant neoplasm: Secondary | ICD-10-CM

## 2013-07-05 DIAGNOSIS — L0390 Cellulitis, unspecified: Secondary | ICD-10-CM

## 2013-07-05 DIAGNOSIS — R040 Epistaxis: Secondary | ICD-10-CM

## 2013-07-05 LAB — CBC WITH DIFFERENTIAL/PLATELET
Basophils Absolute: 0 10*3/uL (ref 0.0–0.1)
Basophils Relative: 0.4 % (ref 0.0–3.0)
Eosinophils Absolute: 0.1 10*3/uL (ref 0.0–0.7)
Eosinophils Relative: 1.9 % (ref 0.0–5.0)
HCT: 35.3 % — ABNORMAL LOW (ref 36.0–46.0)
Hemoglobin: 12.2 g/dL (ref 12.0–15.0)
Lymphocytes Relative: 30.1 % (ref 12.0–46.0)
Lymphs Abs: 1.8 10*3/uL (ref 0.7–4.0)
MCHC: 34.6 g/dL (ref 30.0–36.0)
MCV: 96 fl (ref 78.0–100.0)
Monocytes Absolute: 0.7 10*3/uL (ref 0.1–1.0)
Monocytes Relative: 11.7 % (ref 3.0–12.0)
Neutro Abs: 3.3 10*3/uL (ref 1.4–7.7)
Neutrophils Relative %: 55.9 % (ref 43.0–77.0)
Platelets: 188 10*3/uL (ref 150.0–400.0)
RBC: 3.68 Mil/uL — ABNORMAL LOW (ref 3.87–5.11)
RDW: 14.4 % (ref 11.5–15.5)
WBC: 5.8 10*3/uL (ref 4.0–10.5)

## 2013-07-05 MED ORDER — MUPIROCIN 2 % EX OINT: 1.0000 "application " | TOPICAL_OINTMENT | Freq: Two times a day (BID) | CUTANEOUS | Status: DC

## 2013-07-05 MED ORDER — BENAZEPRIL-HYDROCHLOROTHIAZIDE 10-12.5 MG PO TABS: 1.0000 | ORAL_TABLET | Freq: Every day | ORAL | Status: DC

## 2013-07-05 MED ORDER — AMOXICILLIN 500 MG PO CAPS: 500.0000 mg | ORAL_CAPSULE | Freq: Three times a day (TID) | ORAL | Status: DC

## 2013-07-05 NOTE — Patient Instructions (Addendum)
Over all, your foot redness has improved. We will provide you with a prescription of amoxicillin to fill only if you feel like your cellulitis is getting worse despite treatment since you are going out of town for a month.  Continue your Bactrim DS until the course is completed.  We will have a CBC drawn today to assess your blood count and platelet count since you have been having nosebleeds. This is still more likely allergic, so continue to use your antihistamine allergy medicine.  We will let she knows results of this as soon as it is available.  We have also prescribed Bactroban ointment for your nostrils to help protect your nasal mucosa from drying out or becoming infected.  For your swelling: 1) Stop taking your Amlodipine-Benazepril, we have switched this to benazepril- hydrochlorothiazide. 2) continue the leg elevation to try to decrease the swelling.  Return to clinic in one month to reassess diabetes and followup on swelling and hypertension. Followup sooner if symptoms worsen or do not improve despite treatment.  Nosebleed Nosebleeds can be caused by many conditions including trauma, infections, polyps, foreign bodies, dry mucous membranes or climate, medications and air conditioning. Most nosebleeds occur in the front of the nose. It is because of this location that most nosebleeds can be controlled by pinching the nostrils gently and continuously. Do this for at least 10 to 20 minutes. The reason for this long continuous pressure is that you must hold it long enough for the blood to clot. If during that 10 to 20 minute time period, pressure is released, the process may have to be started again. The nosebleed may stop by itself, quit with pressure, need concentrated heating (cautery) or stop with pressure from packing. HOME CARE INSTRUCTIONS   If your nose was packed, try to maintain the pack inside until your caregiver removes it. If a gauze pack was used and it starts to fall out,  gently replace or cut the end off. Do not cut if a balloon catheter was used to pack the nose. Otherwise, do not remove unless instructed.  Avoid blowing your nose for 12 hours after treatment. This could dislodge the pack or clot and start bleeding again.  If the bleeding starts again, sit up and bending forward, gently pinch the front half of your nose continuously for 20 minutes.  If bleeding was caused by dry mucous membranes, cover the inside of your nose every morning with a petroleum or antibiotic ointment. Use your little fingertip as an applicator. Do this as needed during dry weather. This will keep the mucous membranes moist and allow them to heal.  Maintain humidity in your home by using less air conditioning or using a humidifier.  Do not use aspirin or medications which make bleeding more likely. Your caregiver can give you recommendations on this.  Resume normal activities as able but try to avoid straining, lifting or bending at the waist for several days.  If the nosebleeds become recurrent and the cause is unknown, your caregiver may suggest laboratory tests. SEEK IMMEDIATE MEDICAL CARE IF:   Bleeding recurs and cannot be controlled.  There is unusual bleeding from or bruising on other parts of the body.  You have a fever.  Nosebleeds continue.  There is any worsening of the condition which originally brought you in.  You become lightheaded, feel faint, become sweaty or vomit blood. MAKE SURE YOU:   Understand these instructions.  Will watch your condition.  Will get help right away  if you are not doing well or get worse. Document Released: 11/17/2004 Document Revised: 05/02/2011 Document Reviewed: 01/09/2009 [facility] Patient Information 2014 [place], [place].

## 2013-07-05 NOTE — Progress Notes (Signed)
Pre visit review using our clinic review tool, if applicable. No additional management support is needed unless otherwise documented below in the visit note. 

## 2013-07-05 NOTE — Progress Notes (Signed)
Subjective:    Patient ID: [name] [name], female    DOB: Jan 10, 1956, 58 y.o.   MRN: [id number]  HPI Patient is a 58 year old Caucasian female presenting to the clinic for followup of cellulitis. Patient was seen last week for a medication change followup appointment and at that visit was also diagnosed with a lower right leg cellulitis and prescribed Bactrim. Patient states today that she feels that the redness on her foot has gotten better, however seems like it is still a little red and swollen. Patient also states that the swelling in her legs seems to be no better despite trying to stay off her legs and keeping her legs elevated to help decrease swelling.  Patient also complains of multiple nose bleeds over the past few weeks. The nosebleeds happen every time she blows her nose. Patient states that she feels this is mostly related to her allergies being really irritated right now. Patient currently takes daily allergy medicine. Patient states that the nosebleeds resolve whenever she starts Kleenex up her nose. Patient denies fevers, chills, nausea, vomiting, diarrhea, shortness of breath, and chest pain.   Review of Systems As per the history of present illness and are otherwise negative  Past Medical History  Diagnosis Date  . Depression   . GERD (gastroesophageal reflux disease)   . Hypertension   . Hyperlipidemia   . Colon polyps   . Thyroid disease   . History of recurrent UTIs   . Anemia     menstrual related  . Arthritis     right ankle  . Diabetes mellitus 12/2009    type 2  . Alcohol problem drinking     rehab  . Acute meniscal tear of knee     Left knee   Past Surgical History  Procedure Laterality Date  . Total knee arthroplasty  2009    right  . Arthroscopy left knee  03/11/2011    Surgery was done in [place] for a meniscal tear  . Cesarean section  1987  . Cholecystectomy    . Tubal ligation  1987    reports that she quit smoking about 4 years ago. She has  never used smokeless tobacco. She reports that she drinks about 12 ounces of alcohol per week. She reports that she does not use illicit drugs. family history includes Alcohol abuse in her father; COPD in her mother. She was adopted. Allergies  Allergen Reactions  . Adhesive [Tape]     blisters   Patient's PFS history discussed during this visit with the pt.    Objective:   Physical Exam  Nursing note and vitals reviewed. Constitutional: She is oriented to person, place, and time. She appears well-developed and well-nourished. No distress.  HENT:  Head: Normocephalic and atraumatic.  Right Ear: External ear normal.  Left Ear: External ear normal.  Mouth/Throat: Oropharynx is clear and moist. No oropharyngeal exudate.  Nasal mucosa is dry and friable. Visible scabbing and dry blood.  Bilateral TMs normal.  Bilateral frontal and max sinuses non tender to palpation.  Eyes: Conjunctivae and EOM are normal. Pupils are equal, round, and reactive to light.  Neck: Normal range of motion. Neck supple. No JVD present.  Cardiovascular: Normal rate, regular rhythm, normal heart sounds and intact distal pulses.  Exam reveals no gallop and no friction rub.   No murmur heard. Pulmonary/Chest: Effort normal and breath sounds normal. No stridor. No respiratory distress. She has no wheezes. She has no rales. She exhibits no tenderness.  Musculoskeletal: Normal range of motion. She exhibits edema (1+ pitting edema to the bilateral lower extremities.). She exhibits no tenderness.  Lymphadenopathy:    She has no cervical adenopathy.  Neurological: She is alert and oriented to person, place, and time.  Skin: Skin is warm and dry. No rash noted. She is not diaphoretic. There is erythema. No pallor.  The dorsal aspect of the right foot still has a small amount of erythema swelling. There is no pain to palpation or fluctuance.  Psychiatric: She has a normal mood and affect. Her behavior is normal. Judgment  and thought content normal.    Filed Vitals:   07/05/13 1305  BP: 138/70  Pulse: 94  Temp: 98.9 F (37.2 C)  Resp: 18    Lab Results  Component Value Date   WBC 5.2 08/08/2012   HGB 13.5 08/08/2012   HCT 40.3 08/08/2012   PLT 195.0 08/08/2012   GLUCOSE 78 04/20/2012   CHOL 92 05/27/2013   TRIG 78.0 05/27/2013   HDL 31.90* 05/27/2013   LDLDIRECT 135.1 12/28/2010   LDLCALC 45 05/27/2013   ALT 22 05/27/2013   AST 32 05/27/2013   NA 135 04/20/2012   K 3.5 04/20/2012   CL 97 04/20/2012   CREATININE 0.7 04/20/2012   BUN 6 04/20/2012   CO2 26 04/20/2012   TSH 0.22* 05/27/2013   INR 1.2* 04/12/2010   HGBA1C 5.2 12/31/2012   MICROALBUR 2.1* 04/20/2012      Assessment & Plan:  Eleora was seen today for follow-up, epistaxis and swelling in feet.  Diagnoses and associated orders for this visit:  Follow up - amoxicillin (AMOXIL) 500 MG capsule; Take 1 capsule (500 mg total) by mouth 3 (three) times daily. - See comments below on cellulitis.   Epistaxis - mupirocin ointment (BACTROBAN) 2 %; Place 1 application into the nose 2 (two) times daily. - CBC with Differential  HTN (hypertension) - benazepril-hydrochlorthiazide (LOTENSIN HCT) 10-12.5 MG per tablet; Take 1 tablet by mouth daily. - Trial of Lotensin HCT instead of amlodipine and benazepril in order to hopefully treat the edema more effectively.  Cellulitis - Greatly improved. Redness and swelling has improved, no longer tender. Pt should continue antibiotic therapy to completion.  - Pt concerned for possibility of recurrence since she is going out of town. Assured pt that the antibiotic she is on seems to be working well, and likely would not result in treatment failure unless there is a recurrence of a bacterial infection not treatable by Bactrim. Suggested printed script for Strep coverage with Amoxicillin that pt can fill Only if she feels that the Bactrim is not working, and that her cellulitis is getting worse. This is in light of the  fact that she will be traveling and may not have access to medical care due to limited acceptance of her families insurance plan. - amoxicillin (AMOXIL) 500 MG capsule; Take 1 capsule (500 mg total) by mouth 3 (three) times daily.  Follow up in 1 month to reassess HTN and DM.  Patient Instructions  Over all, your foot redness has improved. We will provide you with a prescription of amoxicillin to fill only if you feel like your cellulitis is getting worse despite treatment since you are going out of town for a month.  Continue your Bactrim DS until the course is completed.  We will have a CBC drawn today to assess your blood count and platelet count since you have been having nosebleeds. This is still more likely allergic,  so continue to use your antihistamine allergy medicine.  We will let she knows results of this as soon as it is available.  We have also prescribed Bactroban ointment for your nostrils to help protect your nasal mucosa from drying out or becoming infected.  For your swelling: 1) Stop taking your Amlodipine-Benazepril, we have switched this to benazepril- hydrochlorothiazide. 2) continue the leg elevation to try to decrease the swelling.  Return to clinic in one month to reassess diabetes and followup on swelling and hypertension. Followup sooner if symptoms worsen or do not improve despite treatment.

## 2013-08-05 ENCOUNTER — Encounter: Payer: Self-pay | Admitting: Physician Assistant

## 2013-08-05 ENCOUNTER — Ambulatory Visit (INDEPENDENT_AMBULATORY_CARE_PROVIDER_SITE_OTHER): Payer: No Typology Code available for payment source | Admitting: Physician Assistant

## 2013-08-05 VITALS — BP 120/68 | HR 70 | Temp 98.7°F | Resp 18 | Wt 235.0 lb

## 2013-08-05 DIAGNOSIS — R040 Epistaxis: Secondary | ICD-10-CM

## 2013-08-05 DIAGNOSIS — R0989 Other specified symptoms and signs involving the circulatory and respiratory systems: Secondary | ICD-10-CM

## 2013-08-05 DIAGNOSIS — E119 Type 2 diabetes mellitus without complications: Secondary | ICD-10-CM

## 2013-08-05 DIAGNOSIS — I10 Essential (primary) hypertension: Secondary | ICD-10-CM

## 2013-08-05 DIAGNOSIS — F329 Major depressive disorder, single episode, unspecified: Secondary | ICD-10-CM

## 2013-08-05 DIAGNOSIS — Z09 Encounter for follow-up examination after completed treatment for conditions other than malignant neoplasm: Secondary | ICD-10-CM

## 2013-08-05 DIAGNOSIS — R0609 Other forms of dyspnea: Secondary | ICD-10-CM

## 2013-08-05 DIAGNOSIS — F32A Depression, unspecified: Secondary | ICD-10-CM

## 2013-08-05 DIAGNOSIS — L0291 Cutaneous abscess, unspecified: Secondary | ICD-10-CM

## 2013-08-05 DIAGNOSIS — F3289 Other specified depressive episodes: Secondary | ICD-10-CM

## 2013-08-05 DIAGNOSIS — L0390 Cellulitis, unspecified: Secondary | ICD-10-CM

## 2013-08-05 MED ORDER — ESCITALOPRAM OXALATE 20 MG PO TABS: 20.0000 mg | ORAL_TABLET | Freq: Every day | ORAL | Status: DC

## 2013-08-05 NOTE — Progress Notes (Signed)
Pre visit review using our clinic review tool, if applicable. No additional management support is needed unless otherwise documented below in the visit note. 

## 2013-08-05 NOTE — Progress Notes (Signed)
Subjective:    Patient ID: [name] [name], female    DOB: 12-14-1955, 58 y.o.   MRN: [id number]  HPI Pt is a 58 year old caucasian female presenting to the clinic for follow up of cellulitis, epistaxis, HTN and DM. Pt states that her epistaxis is resolved with the mupirocin ointment. She states that her cellulitis has resolved with the use of her antibiotics. She states that her hypertension has been stable on lotensin, pt was switched from amlodipine-benazepril due to leg swelling, and she states her leg swelling has resolved. Pt also presenting for DM follow up. Pt was recently switched to janumet however was still experiencing elevated fingerstick blood sugars, and so amaryl was added. Pt states that her blood sugars have since been in the 100s to 200s range with only 1 isolated blood sugar over 300 and a few less than 100. She states she is tolerating all of her medications well. She denies fevers, chills, nausea, vomiting, diarrhea, SOB, and cp.  Pt also recently started taking iron supplement due to increased feeling of fatigue and history of IDA. She is also complaining of DOE, however this has been the same for the last few years. She has no history of heart failure.  Pt also requesting referral to a dermatologist that takes her new medical insurance.   Review of Systems As per HPI and otherwise negative.    Past Medical History  Diagnosis Date  . Depression   . GERD (gastroesophageal reflux disease)   . Hypertension   . Hyperlipidemia   . Colon polyps   . Thyroid disease   . History of recurrent UTIs   . Anemia     menstrual related  . Arthritis     right ankle  . Diabetes mellitus 12/2009    type 2  . Alcohol problem drinking     rehab  . Acute meniscal tear of knee     Left knee    History   Social History  . Marital Status: Married    Spouse Name: N/A    Number of Children: N/A  . Years of Education: N/A   Occupational History  . Not on file.   Social  History Main Topics  . Smoking status: Former Smoker    Quit date: 02/21/2009  . Smokeless tobacco: Never Used  . Alcohol Use: 12.0 oz/week    20 Glasses of wine per week  . Drug Use: No  . Sexual Activity: Yes   Other Topics Concern  . Not on file   Social History Narrative  . No narrative on file    Past Surgical History  Procedure Laterality Date  . Total knee arthroplasty  2009    right  . Arthroscopy left knee  03/11/2011    Surgery was done in [place] for a meniscal tear  . Cesarean section  1987  . Cholecystectomy    . Tubal ligation  1987    Family History  Problem Relation Age of Onset  . Adopted: Yes  . COPD Mother   . Alcohol abuse Father     Allergies  Allergen Reactions  . Adhesive [Tape]     blisters    Current Outpatient Prescriptions on File Prior to Visit  Medication Sig Dispense Refill  . amLODipine-benazepril (LOTREL) 5-10 MG per capsule TAKE ONE CAPSULE BY MOUTH ONCE DAILY.  90 capsule  3  . B Complex Vitamins (B COMPLEX 100 PO) Take 1 tablet by mouth 2 (two) times daily.      Marland Kitchen  benazepril-hydrochlorthiazide (LOTENSIN HCT) 10-12.5 MG per tablet Take 1 tablet by mouth daily.  30 tablet  1  . cetirizine (ZYRTEC) 10 MG tablet TAKE ONE TABLET BY MOUTH ONCE DAILY  90 tablet  3  . Cholecalciferol (VITAMIN D3) 2000 UNITS TABS Take by mouth. 2500iu daily       . diazepam (VALIUM) 2 MG tablet Take 2 tablets (4 mg total) by mouth 2 (two) times daily as needed for anxiety.  30 tablet  5  . ezetimibe (ZETIA) 10 MG tablet Take 1 tablet (10 mg total) by mouth daily.  90 tablet  3  . glimepiride (AMARYL) 1 MG tablet Take 1 tablet (1 mg total) by mouth daily with breakfast.  30 tablet  11  . ibuprofen (ADVIL,MOTRIN) 800 MG tablet Take 800 mg by mouth every 8 (eight) hours as needed for pain.      Marland Kitchen levothyroxine (SYNTHROID, LEVOTHROID) 137 MCG tablet Take 1 tablet (137 mcg total) by mouth daily.  90 tablet  3  . mupirocin ointment (BACTROBAN) 2 % Place 1  application into the nose 2 (two) times daily.  22 g  0  . omeprazole (PRILOSEC) 20 MG capsule Take 20 mg by mouth 2 (two) times daily.      . simvastatin (ZOCOR) 40 MG tablet Take 1 tablet (40 mg total) by mouth daily at 6 PM.  90 tablet  3  . SitaGLIPtin-MetFORMIN HCl (JANUMET XR) 50-1000 MG TB24 Take 1 tablet by mouth daily.  60 tablet  11  . thiamine (VITAMIN B-1) 50 MG tablet Take 1 tablet (50 mg total) by mouth daily.  30 tablet  3  . zaleplon (SONATA) 10 MG capsule Take 1 capsule (10 mg total) by mouth at bedtime as needed for sleep.  30 capsule  5   No current facility-administered medications on file prior to visit.    EXAM: BP 120/68  Pulse 70  Temp(Src) 98.7 F (37.1 C) (Oral)  Resp 18  Wt 235 lb (106.595 kg)  SpO2 97%     Objective:   Physical Exam  Nursing note and vitals reviewed. Constitutional: She is oriented to person, place, and time. She appears well-developed and well-nourished. No distress.  HENT:  Head: Normocephalic and atraumatic.  Eyes: Conjunctivae and EOM are normal. Pupils are equal, round, and reactive to light.  Neck: Normal range of motion. Neck supple.  Cardiovascular: Normal rate, regular rhythm, normal heart sounds and intact distal pulses.  Exam reveals no gallop and no friction rub.   No murmur heard. Pulmonary/Chest: Effort normal and breath sounds normal. No respiratory distress. She has no wheezes. She has no rales. She exhibits no tenderness.  Musculoskeletal: Normal range of motion. She exhibits no edema and no tenderness.  Neurological: She is alert and oriented to person, place, and time.  Skin: Skin is warm and dry. No rash noted. She is not diaphoretic. No erythema. No pallor.  Psychiatric: She has a normal mood and affect. Her behavior is normal. Judgment and thought content normal.     Lab Results  Component Value Date   WBC 5.8 07/05/2013   HGB 12.2 07/05/2013   HCT 35.3* 07/05/2013   PLT 188.0 07/05/2013   GLUCOSE 78 04/20/2012     CHOL 92 05/27/2013   TRIG 78.0 05/27/2013   HDL 31.90* 05/27/2013   LDLDIRECT 135.1 12/28/2010   LDLCALC 45 05/27/2013   ALT 22 05/27/2013   AST 32 05/27/2013   NA 135 04/20/2012   K 3.5 04/20/2012  CL 97 04/20/2012   CREATININE 0.7 04/20/2012   BUN 6 04/20/2012   CO2 26 04/20/2012   TSH 0.22* 05/27/2013   INR 1.2* 04/12/2010   HGBA1C 5.2 12/31/2012   MICROALBUR 2.1* 04/20/2012           Assessment & Plan:  Teasha was seen today for follow-up.  Diagnoses and associated orders for this visit:  DOE (dyspnea on exertion) Comments: Has seen cardiology in the past for Aortic Stenosis, never been evaluated for DOE. Refer to Cardiology. - Ambulatory referral to Cardiology  Follow up - Ambulatory referral to Dermatology - Pt states needs new dermatologist since her insurance recently changed. Will refer.  Epistaxis Comments: Resolved with mupirocin.  HTN (hypertension) Comments: Stable on lotensin, no leg swelling. Leg swelling has resolved.  Cellulitis Comments: Resolved.  Type II or unspecified type diabetes mellitus without mention of complication, not stated as uncontrolled Comments: A1c today. Keep meds the same for now until results are back. Then may change management. - Hemoglobin A1c  Depression - escitalopram (LEXAPRO) 20 MG tablet; Take 1 tablet (20 mg total) by mouth daily. - Pt requested refill. Stable. Tolerating well.  Pt will continue all current medications.  Plan is to follow up in 3 to 6 months, or sooner as needed.   Patient Instructions  You'll be called to schedule an appointment with cardiology to evaluate your shortness of breath on exertion.  You'll be called to schedule appointment with dermatology.  We will call you with the results of her lab work when they are available.  You should have refills on all of your medications call if you do not. Continue all current medications.  Followup in 3 to 6 months to reassess, or sooner as  needed.

## 2013-08-05 NOTE — Patient Instructions (Signed)
You'll be called to schedule an appointment with cardiology to evaluate your shortness of breath on exertion.  You'll be called to schedule appointment with dermatology.  We will call you with the results of her lab work when they are available.  You should have refills on all of your medications call if you do not. Continue all current medications.  Followup in 3 to 6 months to reassess, or sooner as needed.

## 2013-08-06 LAB — HEMOGLOBIN A1C: Hgb A1c MFr Bld: 7.5 % — ABNORMAL HIGH (ref 4.6–6.5)

## 2013-08-09 ENCOUNTER — Telehealth: Payer: Self-pay | Admitting: Physician Assistant

## 2013-08-09 DIAGNOSIS — E119 Type 2 diabetes mellitus without complications: Secondary | ICD-10-CM

## 2013-08-09 MED ORDER — SITAGLIP PHOS-METFORMIN HCL ER 100-1000 MG PO TB24: ORAL_TABLET | ORAL | Status: DC

## 2013-08-09 NOTE — Telephone Encounter (Signed)
Called patient to relay A1c results. A1c was 7.5  4 days ago, compared with 5.2  7 months ago. We will increase to Janumet XR [phone] daily. Plan to recheck A1c in 3 months. Patient understands and acknowledges.

## 2013-08-15 ENCOUNTER — Encounter: Payer: Self-pay | Admitting: Cardiovascular Disease

## 2013-08-15 ENCOUNTER — Ambulatory Visit (INDEPENDENT_AMBULATORY_CARE_PROVIDER_SITE_OTHER): Payer: PRIVATE HEALTH INSURANCE | Admitting: Cardiovascular Disease

## 2013-08-15 VITALS — BP 126/73 | HR 77 | Ht 67.0 in | Wt 235.0 lb

## 2013-08-15 DIAGNOSIS — R5383 Other fatigue: Secondary | ICD-10-CM | POA: Insufficient documentation

## 2013-08-15 DIAGNOSIS — E8881 Metabolic syndrome: Secondary | ICD-10-CM

## 2013-08-15 DIAGNOSIS — G4730 Sleep apnea, unspecified: Secondary | ICD-10-CM

## 2013-08-15 DIAGNOSIS — R0683 Snoring: Secondary | ICD-10-CM

## 2013-08-15 DIAGNOSIS — R5382 Chronic fatigue, unspecified: Secondary | ICD-10-CM

## 2013-08-15 DIAGNOSIS — E785 Hyperlipidemia, unspecified: Secondary | ICD-10-CM

## 2013-08-15 DIAGNOSIS — G4700 Insomnia, unspecified: Secondary | ICD-10-CM

## 2013-08-15 DIAGNOSIS — E039 Hypothyroidism, unspecified: Secondary | ICD-10-CM

## 2013-08-15 DIAGNOSIS — R0989 Other specified symptoms and signs involving the circulatory and respiratory systems: Secondary | ICD-10-CM

## 2013-08-15 DIAGNOSIS — I10 Essential (primary) hypertension: Secondary | ICD-10-CM

## 2013-08-15 DIAGNOSIS — E669 Obesity, unspecified: Secondary | ICD-10-CM

## 2013-08-15 DIAGNOSIS — R011 Cardiac murmur, unspecified: Secondary | ICD-10-CM

## 2013-08-15 DIAGNOSIS — R0609 Other forms of dyspnea: Secondary | ICD-10-CM

## 2013-08-15 DIAGNOSIS — R5381 Other malaise: Secondary | ICD-10-CM

## 2013-08-15 DIAGNOSIS — F329 Major depressive disorder, single episode, unspecified: Secondary | ICD-10-CM

## 2013-08-15 DIAGNOSIS — R0602 Shortness of breath: Secondary | ICD-10-CM

## 2013-08-15 DIAGNOSIS — F32A Depression, unspecified: Secondary | ICD-10-CM

## 2013-08-15 DIAGNOSIS — G4733 Obstructive sleep apnea (adult) (pediatric): Secondary | ICD-10-CM

## 2013-08-15 DIAGNOSIS — F3289 Other specified depressive episodes: Secondary | ICD-10-CM

## 2013-08-15 DIAGNOSIS — E782 Mixed hyperlipidemia: Secondary | ICD-10-CM

## 2013-08-15 DIAGNOSIS — E559 Vitamin D deficiency, unspecified: Secondary | ICD-10-CM

## 2013-08-15 HISTORY — DX: Obstructive sleep apnea (adult) (pediatric): G47.33

## 2013-08-15 LAB — CBC
HCT: 41.1 % (ref 36.0–46.0)
Hemoglobin: 14.4 g/dL (ref 12.0–15.0)
MCH: 32 pg (ref 26.0–34.0)
MCHC: 35 g/dL (ref 30.0–36.0)
MCV: 91.3 fL (ref 78.0–100.0)
Platelets: 173 10*3/uL (ref 150–400)
RBC: 4.5 MIL/uL (ref 3.87–5.11)
RDW: 13.9 % (ref 11.5–15.5)
WBC: 6.8 10*3/uL (ref 4.0–10.5)

## 2013-08-15 NOTE — Patient Instructions (Signed)
Your physician has recommended that you have a sleep study. This test records several body functions during sleep, including: brain activity, eye movement, oxygen and carbon dioxide blood levels, heart rate and rhythm, breathing rate and rhythm, the flow of air through your mouth and nose, snoring, body muscle movements, and chest and belly movement.  Your physician recommends that you return for lab work in: fasting. Do not eat or drink past midnight. You will not need a appointment to have blood drawn.  Your physician has requested that you have a lexiscan myoview. For further information please visit [email or web address]. Please follow instruction sheet, as given.  Your physician has requested that you have an echocardiogram. Echocardiography is a painless test that uses sound waves to create images of your heart. It provides your doctor with information about the size and shape of your heart and how well your heart's chambers and valves are working. This procedure takes approximately one hour. There are no restrictions for this procedure.  Your physician recommends that you schedule a follow-up appointment in: 3 months.

## 2013-08-15 NOTE — Progress Notes (Signed)
Patient ID: [name] [name], female   DOB: 04-26-1955, 58 y.o.   MRN: [id number]     PATIENT PROFILE: [name] [name] is a 58 y.o. female who presents to the office through the courtesy of Berline Lopes for evaluation of shortness of breath and fatigue.   HPI:  [name] [name] is a 58 y.o. female who has a 10-15 year history of type 2 diabetes mellitus, hypertension, as well as hyperlipidemia, for, which she's been on medical therapy.  She had developed significant edema on Lotrel and most recently is now on an as appropriate.  CTs he 10/12.5, for blood pressure control.  She is on Janumet XR, and an oral 1 mg for her diabetes mellitus.  She is treated with simvastatin 40 mg for hyperlipidemia in addition to Zetia 10 mg.  She had lived in [place], but has moved back to the [place] area.  In 2011,an adenosine nuclear study was normal.  This was done because of episodes of chest pain.  An echo Doppler study at that time revealed normal systolic function with mild mitral regurgitation and diminished left ventricular compliance.  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  Actually, year-to-year in half.  Recently, she has noticed progressive fatigue as well as shortness of breath.  She also has noticed some sharp left-sided chest discomfort intermittently.  Upon questioning of her sleep, she sleeps very poorly.  She snores loudly, she has residual daytime sleepiness, her sleep is nonrestorative, and she often wakes up 2-3 times per night.  She has been followed by Dr. Arnoldo Morale her primary care and more recently has seen Kela Millin, PA-C, in his office.  Because of her chest pain, and shortness of breath.  She now presents for followup evaluation.  Additional problems also include hypothyroidism.  In addition to her type 2 diabetes mellitus and history of prior depression.  Past Medical History  Diagnosis Date  . Depression   . GERD (gastroesophageal reflux  disease)   . Hypertension   . Hyperlipidemia   . Colon polyps   . Thyroid disease   . History of recurrent UTIs   . Anemia     menstrual related  . Arthritis     right ankle  . Diabetes mellitus 12/2009    type 2  . Alcohol problem drinking     rehab  . Acute meniscal tear of knee     Left knee    Past Surgical History  Procedure Laterality Date  . Total knee arthroplasty  2009    right  . Arthroscopy left knee  03/11/2011    Surgery was done in [place] for a meniscal tear  . Cesarean section  1987  . Cholecystectomy    . Tubal ligation  1987    Allergies  Allergen Reactions  . Adhesive [Tape]     blisters    Current Outpatient Prescriptions  Medication Sig Dispense Refill  . amLODipine-benazepril (LOTREL) 5-10 MG per capsule TAKE ONE CAPSULE BY MOUTH ONCE DAILY.  90 capsule  3  . B Complex Vitamins (B COMPLEX 100 PO) Take 1 tablet by mouth 2 (two) times daily.      . benazepril-hydrochlorthiazide (LOTENSIN HCT) 10-12.5 MG per tablet Take 1 tablet by mouth daily.  30 tablet  1  . cetirizine (ZYRTEC) 10 MG tablet TAKE ONE TABLET BY MOUTH ONCE DAILY  90 tablet  3  . Cholecalciferol (VITAMIN D3) 2000 UNITS TABS Take by mouth. 2500iu daily       .  diazepam (VALIUM) 2 MG tablet Take 2 tablets (4 mg total) by mouth 2 (two) times daily as needed for anxiety.  30 tablet  5  . escitalopram (LEXAPRO) 20 MG tablet Take 1 tablet (20 mg total) by mouth daily.  90 tablet  3  . ezetimibe (ZETIA) 10 MG tablet Take 1 tablet (10 mg total) by mouth daily.  90 tablet  3  . glimepiride (AMARYL) 1 MG tablet Take 1 tablet (1 mg total) by mouth daily with breakfast.  30 tablet  11  . ibuprofen (ADVIL,MOTRIN) 800 MG tablet Take 800 mg by mouth every 8 (eight) hours as needed for pain.      . IRON PO Take 1 capsule by mouth daily.      Marland Kitchen levothyroxine (SYNTHROID, LEVOTHROID) 137 MCG tablet Take 1 tablet (137 mcg total) by mouth daily.  90 tablet  3  . mupirocin ointment (BACTROBAN) 2 % Place 1  application into the nose 2 (two) times daily.  22 g  0  . omeprazole (PRILOSEC) 20 MG capsule Take 20 mg by mouth 2 (two) times daily.      . simvastatin (ZOCOR) 40 MG tablet Take 1 tablet (40 mg total) by mouth daily at 6 PM.  90 tablet  3  . SitaGLIPtin-MetFORMIN HCl (JANUMET XR) [phone] MG TB24 Take 1 tablet by mouth daily.  90 tablet  2  . thiamine (VITAMIN B-1) 50 MG tablet Take 1 tablet (50 mg total) by mouth daily.  30 tablet  3   No current facility-administered medications for this visit.    Socially, she was born in [place], [place].  She was adopted and was raised in [place].  She ultimately moved to the [place] area.  She moved to [place] in 1981 and stayed there consistently until 2005.  Between 2005 in 2010.  She traveled with her husband in a motor trailer.  She's been back to the [place] area recently.  She quit tobacco in 2012.  She quit alcohol in January 2015 per  Family History  Problem Relation Age of Onset  . Adopted: Yes  . COPD Mother   . Alcohol abuse Father     ROS General: Negative; No fevers, chills, or night sweats HEENT: Negative; No changes in vision or hearing, sinus congestion, difficulty swallowing Pulmonary: Negative; No cough, wheezing, shortness of breath, hemoptysis Cardiovascular:  See HPI;  GI: Positive for GERD No nausea, vomiting, diarrhea, or abdominal pain GU: Negative; No dysuria, hematuria, or difficulty voiding Musculoskeletal: Negative; no myalgias, joint pain, or weakness Hematologic/Oncologic: Negative; no easy bruising, bleeding Endocrine: Positive for diabetes mellitus, and hypothyroidism;  Neuro: Negative; no changes in balance, headaches Skin: Negative; No rashes or skin lesions Psychiatric: Positive for depression Sleep: Negative; No daytime sleepiness, hypersomnolence, bruxism, restless legs, hypnogagnic hallucinations Other comprehensive 14 point system review is negative   Physical Exam BP 126/73  Pulse 77  Ht 5'  7" (1.702 m)  Wt 235 lb (106.595 kg)  BMI 36.80 kg/m2 General: Alert, oriented, no distress.  Moderate obesity Skin: normal turgor, no rashes, warm and dry HEENT: Normocephalic, atraumatic. Pupils equal round and reactive to light; sclera anicteric; extraocular muscles intact; Fundi mild arteriolar narrowing.  No hemorrhages or exudates. Nose without nasal septal hypertrophy Mouth/Parynx benign; Mallinpatti scale 3; partial dentures Neck: No JVD, no carotid bruits; normal carotid upstroke Lungs: clear to ausculatation and percussion; no wheezing or rales Chest wall: without tenderness to palpitation Heart: PMI not displaced, RRR, s1 s2 normal, 2/6 systolic  murmur in the aortic area, and a left sternal border with mild radiation cephalad, no diastolic murmur, no rubs, gallops, thrills, or heaves Abdomen: Moderate obesity;soft, nontender; no hepatosplenomehaly, BS+; abdominal aorta nontender and not dilated by palpation. Back: no CVA tenderness Pulses 2+ Musculoskeletal: full range of motion, normal strength, no joint deformities Extremities: no clubbing cyanosis or edema, Homan's sign negative  Neurologic: grossly nonfocal; Cranial nerves grossly wnl Psychologic: Normal mood and affect   ECG (independently read by me): Normal sinus rhythm at 77 beats per minute.  QTc interval 457 ms.  PR interval 166 ms.  LABS:  BMET    Component Value Date/Time   NA 135 04/20/2012 1353   K 3.5 04/20/2012 1353   CL 97 04/20/2012 1353   CO2 26 04/20/2012 1353   GLUCOSE 78 04/20/2012 1353   BUN 6 04/20/2012 1353   CREATININE 0.7 04/20/2012 1353   CALCIUM 9.2 04/20/2012 1353     Hepatic Function Panel     Component Value Date/Time   PROT 6.9 05/27/2013 1400   ALBUMIN 3.6 05/27/2013 1400   AST 32 05/27/2013 1400   ALT 22 05/27/2013 1400   ALKPHOS 60 05/27/2013 1400   BILITOT 1.5* 05/27/2013 1400   BILIDIR 0.4* 05/27/2013 1400     CBC    Component Value Date/Time   WBC 5.8 07/05/2013 1401   RBC 3.68*  07/05/2013 1401   HGB 12.2 07/05/2013 1401   HCT 35.3* 07/05/2013 1401   PLT 188.0 07/05/2013 1401   MCV 96.0 07/05/2013 1401   MCHC 34.6 07/05/2013 1401   RDW 14.4 07/05/2013 1401   LYMPHSABS 1.8 07/05/2013 1401   MONOABS 0.7 07/05/2013 1401   EOSABS 0.1 07/05/2013 1401   BASOSABS 0.0 07/05/2013 1401     BNP No results found for this basename: probnp    Lipid Panel     Component Value Date/Time   CHOL 92 05/27/2013 1400   TRIG 78.0 05/27/2013 1400   HDL 31.90* 05/27/2013 1400   CHOLHDL 3 05/27/2013 1400   VLDL 15.6 05/27/2013 1400   LDLCALC 45 05/27/2013 1400      RADIOLOGY: No results found.   ASSESSMENT AND PLAN: [name] is a 58 year old female who has a 10-15 year history of diabetes mellitus, hypertension, as well as hyperlipidemia for which she's been on long-term medical therapy.  She also has a history of hypothyroidism, as well as GERD.  Recently, she complains of significant fatigue and very poor sleep.  She also has noticed significant increasing shortness of breath with dyspnea on exertion and has noted some vague left-sided chest discomfort.  With her cardiac risk factor profile, I am recommending that she undergo an echo Doppler study to evaluate systolic and diastolic function and further evaluation of her cardiac murmur as well as a LexiScan Myoview study to assess potential ischemic etiology to her exertional dyspnea.  I'm concerned that she may very well have obstructive sleep apnea contributing to her marked fatigue, residual daytime sleepiness, nonrestorative sleep, and excessive snoring.  I am recommending a complete set of laboratory be obtained consisting of a CBC, chemistry profile, thyroid function studies, vitamin D level, lipid panel, as well as hemoglobin A1c.  I will see her back in the office in followup of the above studies and further recommendations will be made at that time.   Troy Sine, MD, [facility] 08/15/2013 5:24 PM

## 2013-08-16 LAB — COMPREHENSIVE METABOLIC PANEL
ALT: 20 U/L (ref 0–35)
AST: 28 U/L (ref 0–37)
Albumin: 4.3 g/dL (ref 3.5–5.2)
Alkaline Phosphatase: 73 U/L (ref 39–117)
BUN: 11 mg/dL (ref 6–23)
CO2: 29 mEq/L (ref 19–32)
Calcium: 11 mg/dL — ABNORMAL HIGH (ref 8.4–10.5)
Chloride: 99 mEq/L (ref 96–112)
Creat: 0.74 mg/dL (ref 0.50–1.10)
Glucose, Bld: 189 mg/dL — ABNORMAL HIGH (ref 70–99)
Potassium: 4.4 mEq/L (ref 3.5–5.3)
Sodium: 139 mEq/L (ref 135–145)
Total Bilirubin: 1.9 mg/dL — ABNORMAL HIGH (ref 0.2–1.2)
Total Protein: 7.3 g/dL (ref 6.0–8.3)

## 2013-08-16 LAB — LIPID PANEL
Cholesterol: 112 mg/dL (ref 0–200)
HDL: 53 mg/dL (ref >39)
LDL Cholesterol: 41 mg/dL (ref 0–99)
Total CHOL/HDL Ratio: 2.1 Ratio
Triglycerides: 91 mg/dL (ref <150)
VLDL: 18 mg/dL (ref 0–40)

## 2013-08-16 LAB — HEMOGLOBIN A1C
Hgb A1c MFr Bld: 8.3 % — ABNORMAL HIGH (ref <5.7)
Mean Plasma Glucose: 192 mg/dL — ABNORMAL HIGH (ref <117)

## 2013-08-16 LAB — TSH: TSH: 1.116 u[IU]/mL (ref 0.350–4.500)

## 2013-08-16 LAB — T3, FREE: T3, Free: 2.6 pg/mL (ref 2.3–4.2)

## 2013-08-16 LAB — T4, FREE: Free T4: 1.4 ng/dL (ref 0.80–1.80)

## 2013-08-18 LAB — VITAMIN D 1,25 DIHYDROXY
Vitamin D 1, 25 (OH)2 Total: 26 pg/mL (ref 18–72)
Vitamin D2 1, 25 (OH)2: <8 pg/mL
Vitamin D3 1, 25 (OH)2: 26 pg/mL

## 2013-08-22 ENCOUNTER — Other Ambulatory Visit: Payer: Self-pay | Admitting: *Deleted

## 2013-08-22 DIAGNOSIS — G4730 Sleep apnea, unspecified: Secondary | ICD-10-CM

## 2013-08-22 DIAGNOSIS — G4710 Hypersomnia, unspecified: Secondary | ICD-10-CM

## 2013-08-22 DIAGNOSIS — R0989 Other specified symptoms and signs involving the circulatory and respiratory systems: Secondary | ICD-10-CM

## 2013-08-22 DIAGNOSIS — R0609 Other forms of dyspnea: Secondary | ICD-10-CM

## 2013-08-22 DIAGNOSIS — G4700 Insomnia, unspecified: Secondary | ICD-10-CM

## 2013-08-28 ENCOUNTER — Other Ambulatory Visit: Payer: Self-pay | Admitting: Cardiology

## 2013-08-28 DIAGNOSIS — I209 Angina pectoris, unspecified: Secondary | ICD-10-CM

## 2013-08-28 DIAGNOSIS — E785 Hyperlipidemia, unspecified: Secondary | ICD-10-CM

## 2013-08-28 DIAGNOSIS — E119 Type 2 diabetes mellitus without complications: Secondary | ICD-10-CM

## 2013-08-29 ENCOUNTER — Telehealth: Payer: Self-pay | Admitting: Cardiovascular Disease

## 2013-08-29 NOTE — Telephone Encounter (Signed)
I spoke with patient and informed her that her insurance company would not approve her lexiscan myoview ---but suggested she have a stress echo.  I explained that Dr. Claiborne Billings is on vacation this week and we will check with him when he gets back to see how he would like to proceed.  I told the patient that I was going to cancel her lexiscan myoview and routine echo until Dr. Claiborne Billings had made a decision on how to proceed.  She voiced her understanding.

## 2013-09-04 ENCOUNTER — Encounter (HOSPITAL_COMMUNITY): Payer: Self-pay

## 2013-09-04 ENCOUNTER — Ambulatory Visit (HOSPITAL_COMMUNITY): Payer: Self-pay

## 2013-09-05 ENCOUNTER — Telehealth: Payer: Self-pay | Admitting: *Deleted

## 2013-09-05 DIAGNOSIS — Z79899 Other long term (current) drug therapy: Secondary | ICD-10-CM

## 2013-09-05 DIAGNOSIS — R17 Unspecified jaundice: Secondary | ICD-10-CM

## 2013-09-05 NOTE — Telephone Encounter (Signed)
Message copied by Fidel Levy on Thu Sep 05, 2013  2:22 PM ------      Message from: Shelva Majestic A      Created: Fri Aug 30, 2013 12:55 PM       Glu, bili and ca increased; f/u cmet, phosphorous, pth ------

## 2013-09-05 NOTE — Telephone Encounter (Signed)
Patient notified of labs and need for repeat lab studies. Labs ordered per Dr. Claiborne Billings - patient will get blood work this week or early next week.

## 2013-09-07 LAB — COMPREHENSIVE METABOLIC PANEL
ALT: 18 U/L (ref 0–35)
AST: 24 U/L (ref 0–37)
Albumin: 3.8 g/dL (ref 3.5–5.2)
Alkaline Phosphatase: 71 U/L (ref 39–117)
BUN: 14 mg/dL (ref 6–23)
CO2: 27 mEq/L (ref 19–32)
Calcium: 9.7 mg/dL (ref 8.4–10.5)
Chloride: 98 mEq/L (ref 96–112)
Creat: 0.76 mg/dL (ref 0.50–1.10)
Glucose, Bld: 262 mg/dL — ABNORMAL HIGH (ref 70–99)
Potassium: 4.4 mEq/L (ref 3.5–5.3)
Sodium: 134 mEq/L — ABNORMAL LOW (ref 135–145)
Total Bilirubin: 1.4 mg/dL — ABNORMAL HIGH (ref 0.2–1.2)
Total Protein: 6.5 g/dL (ref 6.0–8.3)

## 2013-09-07 LAB — PHOSPHORUS: Phosphorus: 3.4 mg/dL (ref 2.3–4.6)

## 2013-09-09 LAB — PTH, INTACT AND CALCIUM
Calcium: 9.7 mg/dL (ref 8.4–10.5)
PTH: 28 pg/mL (ref 14.0–72.0)

## 2013-09-12 ENCOUNTER — Ambulatory Visit (HOSPITAL_COMMUNITY): Payer: Self-pay

## 2013-09-17 ENCOUNTER — Other Ambulatory Visit: Payer: Self-pay

## 2013-09-17 ENCOUNTER — Telehealth: Payer: Self-pay | Admitting: Internal Medicine

## 2013-09-17 DIAGNOSIS — E119 Type 2 diabetes mellitus without complications: Secondary | ICD-10-CM

## 2013-09-17 NOTE — Telephone Encounter (Signed)
Pt states she received message via mychart that her urine protein test is overdue since 03/1012.  Pt would like to know if you can add to her upcoming lab appt 8/27?

## 2013-09-17 NOTE — Telephone Encounter (Signed)
Micro album added.

## 2013-10-03 LAB — HM DIABETES EYE EXAM

## 2013-10-11 ENCOUNTER — Institutional Professional Consult (permissible substitution): Payer: Self-pay | Admitting: Internal Medicine

## 2013-10-13 ENCOUNTER — Other Ambulatory Visit: Payer: Self-pay | Admitting: Internal Medicine

## 2013-10-14 ENCOUNTER — Ambulatory Visit (HOSPITAL_BASED_OUTPATIENT_CLINIC_OR_DEPARTMENT_OTHER): Payer: No Typology Code available for payment source | Attending: Cardiovascular Disease | Admitting: Radiology

## 2013-10-14 VITALS — Ht 67.0 in | Wt 234.0 lb

## 2013-10-14 DIAGNOSIS — G4733 Obstructive sleep apnea (adult) (pediatric): Secondary | ICD-10-CM | POA: Insufficient documentation

## 2013-10-14 DIAGNOSIS — G4730 Sleep apnea, unspecified: Secondary | ICD-10-CM

## 2013-10-14 DIAGNOSIS — R0609 Other forms of dyspnea: Secondary | ICD-10-CM

## 2013-10-14 DIAGNOSIS — I4949 Other premature depolarization: Secondary | ICD-10-CM | POA: Insufficient documentation

## 2013-10-14 DIAGNOSIS — G4710 Hypersomnia, unspecified: Secondary | ICD-10-CM

## 2013-10-14 DIAGNOSIS — G4700 Insomnia, unspecified: Secondary | ICD-10-CM

## 2013-10-14 DIAGNOSIS — R0989 Other specified symptoms and signs involving the circulatory and respiratory systems: Secondary | ICD-10-CM

## 2013-10-17 ENCOUNTER — Encounter: Payer: Self-pay | Admitting: *Deleted

## 2013-10-17 ENCOUNTER — Other Ambulatory Visit (INDEPENDENT_AMBULATORY_CARE_PROVIDER_SITE_OTHER): Payer: No Typology Code available for payment source

## 2013-10-17 DIAGNOSIS — E119 Type 2 diabetes mellitus without complications: Secondary | ICD-10-CM

## 2013-10-17 DIAGNOSIS — Z23 Encounter for immunization: Secondary | ICD-10-CM

## 2013-10-17 DIAGNOSIS — IMO0001 Reserved for inherently not codable concepts without codable children: Secondary | ICD-10-CM

## 2013-10-17 DIAGNOSIS — E1165 Type 2 diabetes mellitus with hyperglycemia: Principal | ICD-10-CM

## 2013-10-17 LAB — MICROALBUMIN / CREATININE URINE RATIO
Creatinine,U: 82 mg/dL
Microalb Creat Ratio: 0.2 mg/g (ref 0.0–30.0)
Microalb, Ur: 0.2 mg/dL (ref 0.0–1.9)

## 2013-10-17 LAB — HEMOGLOBIN A1C: Hgb A1c MFr Bld: 7.8 % — ABNORMAL HIGH (ref 4.6–6.5)

## 2013-10-22 DIAGNOSIS — G4730 Sleep apnea, unspecified: Secondary | ICD-10-CM

## 2013-10-22 DIAGNOSIS — G4710 Hypersomnia, unspecified: Secondary | ICD-10-CM

## 2013-10-22 NOTE — Sleep Study (Signed)
   NAME: [name] [name] DATE OF BIRTH:  October 02, 1955 MEDICAL RECORD NUMBER [id number]  LOCATION: [facility]  PHYSICIAN: Kathee Delton  DATE OF STUDY: 10/14/2013  SLEEP STUDY TYPE: Nocturnal Polysomnogram               REFERRING PHYSICIAN: Troy Sine, MD  INDICATION FOR STUDY: Hypersomnia with sleep apnea  EPWORTH SLEEPINESS SCORE:  16 HEIGHT: 5\' 7"  (170.2 cm)  WEIGHT: 234 lb (106.142 kg)    Body mass index is 36.64 kg/(m^2).  NECK SIZE: 13.5 in.  MEDICATIONS: Reviewed in the sleep record  SLEEP ARCHITECTURE: The patient had a total sleep time of 306 minutes, with very little slow-wave sleep and no REM. Sleep onset latency was normal at 20 minutes, and sleep efficiency was moderately reduced at 79%.  RESPIRATORY DATA: The patient was found to have no apneas and only 3 obstructive hypopneas, giving her an AHI of 0.6 events per hour. There were also small numbers of respiratory effort-related arousals that were not felt to be clinically significant. Mild to moderate snoring was noted throughout. It should be noted her occasional events occurred primarily in the supine position.  OXYGEN DATA: There was transient oxygen desaturation as low as 91% during the night.  CARDIAC DATA: Small numbers of PVCs noted  MOVEMENT/PARASOMNIA: The patient had small numbers of periodic limb movements that were not clinically significant, and no abnormal behaviors were seen.   IMPRESSION/ RECOMMENDATION:    1) small numbers of obstructive events which do not meet the AHI criteria for the obstructive sleep apnea syndrome. The patient did have mild to moderate snoring.  It should be noted the patient never achieved REM, and therefore her degree of sleep disordered breathing may be underestimated. If the patient is felt to have significant daytime sleepiness that remains unexplained by this study,  further testing may be necessary. Clinical correlation is suggested.   2) occasional  PVC noted, but no clinically significant arrhythmias were seen.    [place], American Board of Sleep Medicine  ELECTRONICALLY SIGNED ON:  10/22/2013, 5:54 PM [place] PH: [phone]   FX: [phone] [place]

## 2013-10-24 ENCOUNTER — Ambulatory Visit (INDEPENDENT_AMBULATORY_CARE_PROVIDER_SITE_OTHER): Payer: No Typology Code available for payment source | Admitting: Podiatry

## 2013-10-24 ENCOUNTER — Ambulatory Visit (INDEPENDENT_AMBULATORY_CARE_PROVIDER_SITE_OTHER): Payer: No Typology Code available for payment source

## 2013-10-24 ENCOUNTER — Ambulatory Visit (INDEPENDENT_AMBULATORY_CARE_PROVIDER_SITE_OTHER): Payer: No Typology Code available for payment source | Admitting: Physician Assistant

## 2013-10-24 ENCOUNTER — Encounter: Payer: Self-pay | Admitting: Physician Assistant

## 2013-10-24 ENCOUNTER — Other Ambulatory Visit: Payer: Self-pay | Admitting: Physician Assistant

## 2013-10-24 VITALS — BP 110/72 | HR 66 | Temp 99.1°F | Resp 18 | Wt 248.1 lb

## 2013-10-24 DIAGNOSIS — M19079 Primary osteoarthritis, unspecified ankle and foot: Secondary | ICD-10-CM

## 2013-10-24 DIAGNOSIS — Q6650 Congenital pes planus, unspecified foot: Secondary | ICD-10-CM

## 2013-10-24 DIAGNOSIS — R52 Pain, unspecified: Secondary | ICD-10-CM

## 2013-10-24 DIAGNOSIS — M6520 Calcific tendinitis, unspecified site: Secondary | ICD-10-CM

## 2013-10-24 DIAGNOSIS — E119 Type 2 diabetes mellitus without complications: Secondary | ICD-10-CM

## 2013-10-24 DIAGNOSIS — I10 Essential (primary) hypertension: Secondary | ICD-10-CM

## 2013-10-24 DIAGNOSIS — M65279 Calcific tendinitis, unspecified ankle and foot: Secondary | ICD-10-CM

## 2013-10-24 MED ORDER — GLIMEPIRIDE 2 MG PO TABS: 2.0000 mg | ORAL_TABLET | Freq: Every day | ORAL | Status: DC

## 2013-10-24 MED ORDER — MELOXICAM 15 MG PO TABS: 15.0000 mg | ORAL_TABLET | Freq: Every day | ORAL | Status: DC

## 2013-10-24 MED ORDER — BENAZEPRIL-HYDROCHLOROTHIAZIDE 10-12.5 MG PO TABS: 1.0000 | ORAL_TABLET | Freq: Every day | ORAL | Status: DC

## 2013-10-24 MED ORDER — SITAGLIP PHOS-METFORMIN HCL ER 50-1000 MG PO TB24: ORAL_TABLET | ORAL | Status: DC

## 2013-10-24 NOTE — Progress Notes (Signed)
   Subjective:    Patient ID: [name] [name], female    DOB: 1955-06-23, 58 y.o.   MRN: [id number]  HPI PT STATED B/L ANKLE AND TOP OF THE FOOT ARE HURTING FOR 4 MONTHS. THE FEET ARE GETTING WORSE. FEET GET AGGRAVATED MORE WHEN WALKING. TRIED NO TREATMENT.   Review of Systems  Constitutional: Positive for fatigue.  Eyes: Positive for itching.  Genitourinary: Positive for enuresis.  Musculoskeletal: Positive for back pain, gait problem and joint swelling.  All other systems reviewed and are negative.      Objective:   Physical Exam: I have reviewed her past medical history medications allergies surgeries social history and review of systems. Pulses are strongly palpable bilateral. Neurologic sensorium is intact per Semmes-Weinstein monofilament. Deep tendon reflexes are intact bilateral muscle strength is 5 over 5 dorsiflexors plantar flexors inverters everters all intrinsic musculature is intact. Orthopedic evaluation demonstrates all joints distal to the ankle a full range of motion without crepitation. She has pain on palpation of the posterior tibial tendons as they course beneath the medial malleolus extending to the level of the navicular tuberosity. 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 She also has pes planus flexible in nature. She has tenderness on frontal plane range of motion particularly in the midfoot. Radiographic evaluation does demonstrate pes planus with osteoarthritis of the midfoot dorsal spurring left greater than right. Soft tissue increase in density at the posterior tibial tendon consistent with pes planus and posterior tibial tendinitis.        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  Plan: Discussed etiology pathology conservative versus surgical therapies discussed the need for  injections which she declined. Discussed the need for orthotics which she declined. Stating that she does not wear tennis shoes so she would not be able to wear orthotics. We did discuss other possibilities today such as open toed t shoes foot flops and sandals. I will followup with her as needed

## 2013-10-24 NOTE — Patient Instructions (Addendum)
Call and let us know exactly how much Amaryl and Janumet XR you are taking. We are going to try and maximize your therapy by increasing your doses of these medications prior to adding additional medications.  If we continue to be unable to control your a1c more closely, we may need to start insulin or refer you to a diabetes specialist.  We will plan to reassess your A1c in 3 months.  Keep your follow up plan with your additional providers.  If emergency symptoms discussed during visit developed, seek medical attention immediately.  Followup in 3 months to reassess, or for worsening or persistent symptoms despite treatment.    Type 2 Diabetes Mellitus Type 2 diabetes mellitus, often simply referred to as type 2 diabetes, is a long-lasting (chronic) disease. In type 2 diabetes, the pancreas does not make enough insulin (a hormone), the cells are less responsive to the insulin that is made (insulin resistance), or both. Normally, insulin moves sugars from food into the tissue cells. The tissue cells use the sugars for energy. The lack of insulin or the lack of normal response to insulin causes excess sugars to build up in the blood instead of going into the tissue cells. As a result, high blood sugar (hyperglycemia) develops. The effect of high sugar (glucose) levels can cause many complications. Type 2 diabetes was also previously called adult-onset diabetes, but it can occur at any age.  RISK FACTORS  A person is predisposed to developing type 2 diabetes if someone in the family has the disease and also has one or more of the following primary risk factors:  Overweight.  An inactive lifestyle.  A history of consistently eating high-calorie foods. Maintaining a normal weight and regular physical activity can reduce the chance of developing type 2 diabetes. SYMPTOMS  A person with type 2 diabetes may not show symptoms initially. The symptoms of type 2 diabetes appear slowly. The symptoms  include:  Increased thirst (polydipsia).  Increased urination (polyuria).  Increased urination during the night (nocturia).  Weight loss. This weight loss may be rapid.  Frequent, recurring infections.  Tiredness (fatigue).  Weakness.  Vision changes, such as blurred vision.  Fruity smell to your breath.  Abdominal pain.  Nausea or vomiting.  Cuts or bruises which are slow to heal.  Tingling or numbness in the hands or feet. DIAGNOSIS Type 2 diabetes is frequently not diagnosed until complications of diabetes are present. Type 2 diabetes is diagnosed when symptoms or complications are present and when blood glucose levels are increased. Your blood glucose level may be checked by one or more of the following blood tests:  A fasting blood glucose test. You will not be allowed to eat for at least 8 hours before a blood sample is taken.  A random blood glucose test. Your blood glucose is checked at any time of the day regardless of when you ate.  A hemoglobin A1c blood glucose test. A hemoglobin A1c test provides information about blood glucose control over the previous 3 months.  An oral glucose tolerance test (OGTT). Your blood glucose is measured after you have not eaten (fasted) for 2 hours and then after you drink a glucose-containing beverage. TREATMENT   You may need to take insulin or diabetes medicine daily to keep blood glucose levels in the desired range.  If you use insulin, you may need to adjust the dosage depending on the carbohydrates that you eat with each meal or snack. The treatment goal is to maintain  the before meal blood sugar (preprandial glucose) level at 70-130 mg/dL. HOME CARE INSTRUCTIONS   Have your hemoglobin A1c level checked twice a year.  Perform daily blood glucose monitoring as directed by your health care provider.  Monitor urine ketones when you are ill and as directed by your health care provider.  Take your diabetes medicine or  insulin as directed by your health care provider to maintain your blood glucose levels in the desired range.  Never run out of diabetes medicine or insulin. It is needed every day.  If you are using insulin, you may need to adjust the amount of insulin given based on your intake of carbohydrates. Carbohydrates can raise blood glucose levels but need to be included in your diet. Carbohydrates provide vitamins, minerals, and fiber which are an essential part of a healthy diet. Carbohydrates are found in fruits, vegetables, whole grains, dairy products, legumes, and foods containing added sugars.  Eat healthy foods. You should make an appointment to see a registered dietitian to help you create an eating plan that is right for you.  Lose weight if you are overweight.  Carry a medical alert card or wear your medical alert jewelry.  Carry a 15-gram carbohydrate snack with you at all times to treat low blood glucose (hypoglycemia). Some examples of 15-gram carbohydrate snacks include:  Glucose tablets, 3 or 4.  Glucose gel, 15-gram tube.  Raisins, 2 tablespoons (24 grams).  Jelly beans, 6.  Animal crackers, 8.  Regular pop, 4 ounces (120 mL).  Gummy treats, 9.  Recognize hypoglycemia. Hypoglycemia occurs with blood glucose levels of 70 mg/dL and below. The risk for hypoglycemia increases when fasting or skipping meals, during or after intense exercise, and during sleep. Hypoglycemia symptoms can include:  Tremors or shakes.  Decreased ability to concentrate.  Sweating.  Increased heart rate.  Headache.  Dry mouth.  Hunger.  Irritability.  Anxiety.  Restless sleep.  Altered speech or coordination.  Confusion.  Treat hypoglycemia promptly. If you are alert and able to safely swallow, follow the 15:15 rule:  Take 15-20 grams of rapid-acting glucose or carbohydrate. Rapid-acting options include glucose gel, glucose tablets, or 4 ounces (120 mL) of fruit juice, regular  soda, or low-fat milk.  Check your blood glucose level 15 minutes after taking the glucose.  Take 15-20 grams more of glucose if the repeat blood glucose level is still 70 mg/dL or below.  Eat a meal or snack within 1 hour once blood glucose levels return to normal.  Be alert to feeling very thirsty and urinating more frequently than usual, which are early signs of hyperglycemia. An early awareness of hyperglycemia allows for prompt treatment. Treat hyperglycemia as directed by your health care provider.  Engage in at least 150 minutes of moderate-intensity physical activity a week, spread over at least 3 days of the week or as directed by your health care provider. In addition, you should engage in resistance exercise at least 2 times a week or as directed by your health care provider. Try to spend no more than 90 minutes at one time inactive.  Adjust your medicine and food intake as needed if you start a new exercise or sport.  Follow your sick-day plan anytime you are unable to eat or drink as usual.  Do not use any tobacco products including cigarettes, chewing tobacco, or electronic cigarettes. If you need help quitting, ask your health care provider.  Limit alcohol intake to no more than 1 drink per day  for nonpregnant women and 2 drinks per day for men. You should drink alcohol only when you are also eating food. Talk with your health care provider whether alcohol is safe for you. Tell your health care provider if you drink alcohol several times a week.  Keep all follow-up visits as directed by your health care provider. This is important.  Schedule an eye exam soon after the diagnosis of type 2 diabetes and then annually.  Perform daily skin and foot care. Examine your skin and feet daily for cuts, bruises, redness, nail problems, bleeding, blisters, or sores. A foot exam by a health care provider should be done annually.  Brush your teeth and gums at least twice a day and floss at  least once a day. Follow up with your dentist regularly.  Share your diabetes management plan with your workplace or school.  Stay up-to-date with immunizations. It is recommended that people with diabetes who are over 64 years old get the pneumonia vaccine. In some cases, two separate shots may be given. Ask your health care provider if your pneumonia vaccination is up-to-date.  Learn to manage stress.  Obtain ongoing diabetes education and support as needed.  Participate in or seek rehabilitation as needed to maintain or improve independence and quality of life. Request a physical or occupational therapy referral if you are having foot or hand numbness, or difficulties with grooming, dressing, eating, or physical activity. SEEK MEDICAL CARE IF:   You are unable to eat food or drink fluids for more than 6 hours.  You have nausea and vomiting for more than 6 hours.  Your blood glucose level is over 240 mg/dL.  There is a change in mental status.  You develop an additional serious illness.  You have diarrhea for more than 6 hours.  You have been sick or have had a fever for a couple of days and are not getting better.  You have pain during any physical activity.  SEEK IMMEDIATE MEDICAL CARE IF:  You have difficulty breathing.  You have moderate to large ketone levels. MAKE SURE YOU:  Understand these instructions.  Will watch your condition.  Will get help right away if you are not doing well or get worse. Document Released: 02/07/2005 Document Revised: 06/24/2013 Document Reviewed: 09/06/2011 [facility] Patient Information 2015 [place], [place]. This information is not intended to replace advice given to you by your health care provider. Make sure you discuss any questions you have with your health care provider.

## 2013-10-24 NOTE — Progress Notes (Signed)
Subjective:    Patient ID: [name] [name], female    DOB: 18-Dec-1955, 58 y.o.   MRN: [id number]  HPI Patient is a 58 y.o. female presenting for follow up on DM II.  Pt recently had visits with Ophthalmology and Podiatry. Pt currently is taking Janumet XR 100/1000mg  and Amaryl 1mg . She states that she is taking 2 of the Janumet XR at night because that is what her bottle says she is supposed to be taking. Her A1c on 08/05/2013 was 7.5%, and her A1c on 10/17/2013 was 7.8%. She states that her fingerstick blood sugars have been ranging from 117-440, with an average in the low 200s. Recent urine microalb/creat ration was also stable. She states she is tolerating these medications well and denies adverse effects to treatment. Patient denies fevers, chills, nausea, vomiting, diarrhea, shortness of breath, chest pain, headache, syncope.    Review of Systems As per HPI and are otherwise negative.   Past Medical History  Diagnosis Date  . Depression   . GERD (gastroesophageal reflux disease)   . Hypertension   . Hyperlipidemia   . Colon polyps   . Thyroid disease   . History of recurrent UTIs   . Anemia     menstrual related  . Arthritis     right ankle  . Diabetes mellitus 12/2009    type 2  . Alcohol problem drinking     rehab  . Acute meniscal tear of knee     Left knee    History   Social History  . Marital Status: Married    Spouse Name: N/A    Number of Children: N/A  . Years of Education: N/A   Occupational History  . Not on file.   Social History Main Topics  . Smoking status: Former Smoker    Quit date: 02/21/2009  . Smokeless tobacco: Never Used  . Alcohol Use: 12.0 oz/week    20 Glasses of wine per week  . Drug Use: No  . Sexual Activity: Yes   Other Topics Concern  . Not on file   Social History Narrative  . No narrative on file    Past Surgical History  Procedure Laterality Date  . Total knee arthroplasty  2009    right  . Arthroscopy left knee   03/11/2011    Surgery was done in [place] for a meniscal tear  . Cesarean section  1987  . Cholecystectomy    . Tubal ligation  1987    Family History  Problem Relation Age of Onset  . Adopted: Yes  . COPD Mother   . Alcohol abuse Father     Allergies  Allergen Reactions  . Adhesive [Tape]     blisters    Current Outpatient Prescriptions on File Prior to Visit  Medication Sig Dispense Refill  . B Complex Vitamins (B COMPLEX 100 PO) Take 1 tablet by mouth 2 (two) times daily.      . benazepril-hydrochlorthiazide (LOTENSIN HCT) 10-12.5 MG per tablet Take 1 tablet by mouth daily.  30 tablet  1  . cetirizine (ZYRTEC) 10 MG tablet TAKE ONE TABLET BY MOUTH ONCE DAILY  90 tablet  3  . Cholecalciferol (VITAMIN D3) 2000 UNITS TABS Take by mouth. 2500iu daily       . diazepam (VALIUM) 2 MG tablet Take 2 tablets (4 mg total) by mouth 2 (two) times daily as needed for anxiety.  30 tablet  5  . escitalopram (LEXAPRO) 20 MG tablet Take 1 tablet (  20 mg total) by mouth daily.  90 tablet  3  . ezetimibe (ZETIA) 10 MG tablet Take 1 tablet (10 mg total) by mouth daily.  90 tablet  3  . glimepiride (AMARYL) 1 MG tablet Take 1 tablet (1 mg total) by mouth daily with breakfast.  30 tablet  11  . IRON PO Take 1 capsule by mouth daily.      Marland Kitchen levothyroxine (SYNTHROID, LEVOTHROID) 137 MCG tablet Take 1 tablet (137 mcg total) by mouth daily.  90 tablet  3  . mupirocin ointment (BACTROBAN) 2 % Place 1 application into the nose 2 (two) times daily.  22 g  0  . omeprazole (PRILOSEC) 20 MG capsule Take 20 mg by mouth 2 (two) times daily.      . simvastatin (ZOCOR) 40 MG tablet Take 1 tablet (40 mg total) by mouth daily at 6 PM.  90 tablet  3  . SitaGLIPtin-MetFORMIN HCl (JANUMET XR) [phone] MG TB24 Take 1 tablet by mouth daily.  90 tablet  2  . thiamine (VITAMIN B-1) 50 MG tablet TAKE ONE TABLET BY MOUTH ONCE DAILY  30 tablet  3   No current facility-administered medications on file prior to visit.     EXAM: BP 110/72  Pulse 66  Temp(Src) 99.1 F (37.3 C) (Oral)  Resp 18  Wt 248 lb 1.6 oz (112.537 kg)     Objective:   Physical Exam  Nursing note and vitals reviewed. Constitutional: She is oriented to person, place, and time. She appears well-developed and well-nourished. No distress.  HENT:  Head: Normocephalic and atraumatic.  Eyes: Conjunctivae and EOM are normal. Pupils are equal, round, and reactive to light.  Neck: Normal range of motion.  Cardiovascular: Normal rate, regular rhythm and intact distal pulses.   Pulmonary/Chest: Effort normal and breath sounds normal. No respiratory distress. She exhibits no tenderness.  Musculoskeletal: Normal range of motion.  Neurological: She is alert and oriented to person, place, and time.  Skin: Skin is warm and dry. No rash noted. She is not diaphoretic. No erythema. No pallor.  Psychiatric: She has a normal mood and affect. Her behavior is normal. Judgment and thought content normal.     Lab Results  Component Value Date   WBC 6.8 08/15/2013   HGB 14.4 08/15/2013   HCT 41.1 08/15/2013   PLT 173 08/15/2013   GLUCOSE 262* 09/05/2013   CHOL 112 08/15/2013   TRIG 91 08/15/2013   HDL 53 08/15/2013   LDLDIRECT 135.1 12/28/2010   LDLCALC 41 08/15/2013   ALT 18 09/05/2013   AST 24 09/05/2013   NA 134* 09/05/2013   K 4.4 09/05/2013   CL 98 09/05/2013   CREATININE 0.76 09/05/2013   BUN 14 09/05/2013   CO2 27 09/05/2013   TSH 1.116 08/15/2013   INR 1.2* 04/12/2010   HGBA1C 7.8* 10/17/2013   MICROALBUR 0.2 10/17/2013        Assessment & Plan:  [name] [name] was seen today for 3 month follow up.  Diagnoses and associated orders for this visit:  Type II or unspecified type diabetes mellitus without mention of complication, not stated as uncontrolled Comments: Need better control, will find out what pt is taking exactly and maximize therapy on Janumet XR and Amaryl. Follow up in 3 months. - Hemoglobin A1c; Future - glimepiride (AMARYL) 2 MG  tablet; Take 1 tablet (2 mg total) by mouth daily with breakfast. - SitaGLIPtin-MetFORMIN HCl 50-1000 MG TB24; Take 2 pills (total 100-2000mg ) daily.  Pt called husband and is currently taking Janumet XR 50-1000mg , 2 pills once daily (total 100-2000mg ). Will continue, and will increase Amaryl to 2mg  once daily.  Discussed the importance of modifying diet and exercise habits along with maximizing medical therapy, and that if we cannot control the DM II with these measures we may need to add insulin therapy, or refer pt to a diabetes specialist.  Return precautions provided, and patient handout on DM II.  Plan to follow up in 3 months to reassess, or for worsening or persistent symptoms despite treatment.  Patient Instructions  Call and let us know exactly how much Amaryl and Janumet XR you are taking. We are going to try and maximize your therapy by increasing your doses of these medications prior to adding additional medications.  If we continue to be unable to control your a1c more closely, we may need to start insulin or refer you to a diabetes specialist.  We will plan to reassess your A1c in 3 months.  Keep your follow up plan with your additional providers.  If emergency symptoms discussed during visit developed, seek medical attention immediately.  Followup in 3 months to reassess, or for worsening or persistent symptoms despite treatment.

## 2013-10-24 NOTE — Progress Notes (Signed)
Pre visit review using our clinic review tool, if applicable. No additional management support is needed unless otherwise documented below in the visit note. 

## 2013-11-07 ENCOUNTER — Other Ambulatory Visit: Payer: Self-pay | Admitting: Physician Assistant

## 2013-11-07 ENCOUNTER — Telehealth: Payer: Self-pay | Admitting: Physician Assistant

## 2013-11-07 DIAGNOSIS — Z9289 Personal history of other medical treatment: Secondary | ICD-10-CM

## 2013-11-07 NOTE — Telephone Encounter (Signed)
Pt would like a referral for 3D breast mammogram sent to [facility] [phone] She is scheduled for 11/20/13

## 2013-11-19 ENCOUNTER — Other Ambulatory Visit: Payer: Self-pay | Admitting: Internal Medicine

## 2013-11-20 LAB — HM MAMMOGRAPHY

## 2013-11-21 ENCOUNTER — Encounter: Payer: Self-pay | Admitting: Physician Assistant

## 2013-11-21 ENCOUNTER — Ambulatory Visit (INDEPENDENT_AMBULATORY_CARE_PROVIDER_SITE_OTHER): Payer: No Typology Code available for payment source | Admitting: Physician Assistant

## 2013-11-21 VITALS — BP 122/70 | HR 60 | Temp 98.4°F | Resp 18 | Wt 255.3 lb

## 2013-11-21 DIAGNOSIS — Z23 Encounter for immunization: Secondary | ICD-10-CM

## 2013-11-21 DIAGNOSIS — I350 Nonrheumatic aortic (valve) stenosis: Secondary | ICD-10-CM

## 2013-11-21 DIAGNOSIS — F419 Anxiety disorder, unspecified: Secondary | ICD-10-CM

## 2013-11-21 MED ORDER — VITAMIN B-1 50 MG PO TABS: ORAL_TABLET | ORAL | Status: DC

## 2013-11-21 MED ORDER — DIAZEPAM 2 MG PO TABS
2.0000 mg | ORAL_TABLET | Freq: Every day | ORAL | Status: DC | PRN
Start: 2013-11-21 — End: 2014-02-26

## 2013-11-21 NOTE — Progress Notes (Signed)
Pre visit review using our clinic review tool, if applicable. No additional management support is needed unless otherwise documented below in the visit note. 

## 2013-11-21 NOTE — Progress Notes (Signed)
Subjective:    Patient ID: [name] [name], female    DOB: [birth year], 58 y.o.   MRN: [id number]  HPI Patient is a 58 y.o. female presenting for medical clearance for knee surgery.  Aortic Stenosis- Pt is scheduled to have a revision of her Right TKA on 12/16/13. She is needing medical clearance for surgery. Pt recently had 3 month follow up on chronic conditions, and form a medical standpoint would be clear, however she also will need cardiac clearance from Hx of Aortic Stenosis prior to surgery. Pt was referred to Cardiology in 07/2013 to evaluate her Aortic Stenosis. She was scheduled for both a nuclear stress test and an Echocardiogram to evaluate her murmur. Unfortunately these were never completed. Discussed at 3 month f/u last month that pt would need to reschedule these, however at this time they still have not been rescheduled. Advised pt that she would need to have this study done to evaluate the severity of her AS prior to getting clearance for the surgery. Pt states that she also has a follow up appointment with cardiology next week. Patient denies fevers, chills, nausea, vomiting, diarrhea, shortness of breath, chest pain, headache, weakness, syncope.  Anxiety- The pt is also requesting a refill on her Valium. The pt also take lexapro daily and tolerates both medications well and denies adverse effects to treatment. The pt is currently prescribed Two (2) 2mg  tabs twice daily as needed, however she states that she usually only takes one daily as needed. She denies any feelings of helplessness or hopelessness, SU or HI.  Review of Systems As per HPI and are otherwise negative.   Past Medical History  Diagnosis Date  . Depression   . GERD (gastroesophageal reflux disease)   . Hypertension   . Hyperlipidemia   . Colon polyps   . Thyroid disease   . History of recurrent UTIs   . Anemia     menstrual related  . Arthritis     right ankle  . Diabetes mellitus 12/2009    type 2  .  Alcohol problem drinking     rehab  . Acute meniscal tear of knee     Left knee    History   Social History  . Marital Status: Married    Spouse Name: N/A    Number of Children: N/A  . Years of Education: N/A   Occupational History  . Not on file.   Social History Main Topics  . Smoking status: Former Smoker    Quit date: 02/21/2009  . Smokeless tobacco: Never Used  . Alcohol Use: 12.0 oz/week    20 Glasses of wine per week  . Drug Use: No  . Sexual Activity: Yes   Other Topics Concern  . Not on file   Social History Narrative  . No narrative on file    Past Surgical History  Procedure Laterality Date  . Total knee arthroplasty  2009    right  . Arthroscopy left knee  03/11/2011    Surgery was done in [place] for a meniscal tear  . Cesarean section  1987  . Cholecystectomy    . Tubal ligation  1987    Family History  Problem Relation Age of Onset  . Adopted: Yes  . COPD Mother   . Alcohol abuse Father     Allergies  Allergen Reactions  . Adhesive [Tape]     blisters    Current Outpatient Prescriptions on File Prior to Visit  Medication Sig Dispense  Refill  . B Complex Vitamins (B COMPLEX 100 PO) Take 1 tablet by mouth 2 (two) times daily.      . benazepril-hydrochlorthiazide (LOTENSIN HCT) 10-12.5 MG per tablet Take 1 tablet by mouth daily.  90 tablet  1  . cetirizine (ZYRTEC) 10 MG tablet TAKE ONE TABLET BY MOUTH ONCE DAILY  90 tablet  3  . Cholecalciferol (VITAMIN D3) 2000 UNITS TABS Take by mouth. 2500iu daily       . escitalopram (LEXAPRO) 20 MG tablet Take 1 tablet (20 mg total) by mouth daily.  90 tablet  3  . ezetimibe (ZETIA) 10 MG tablet Take 1 tablet (10 mg total) by mouth daily.  90 tablet  3  . glimepiride (AMARYL) 2 MG tablet Take 1 tablet (2 mg total) by mouth daily with breakfast.  90 tablet  3  . IRON PO Take 1 capsule by mouth daily.      Marland Kitchen levothyroxine (SYNTHROID, LEVOTHROID) 137 MCG tablet Take 1 tablet (137 mcg total) by mouth  daily.  90 tablet  3  . meloxicam (MOBIC) 15 MG tablet Take 1 tablet (15 mg total) by mouth daily.  30 tablet  3  . mupirocin ointment (BACTROBAN) 2 % Place 1 application into the nose 2 (two) times daily.  22 g  0  . omeprazole (PRILOSEC) 20 MG capsule Take 20 mg by mouth 2 (two) times daily.      . simvastatin (ZOCOR) 40 MG tablet Take 1 tablet (40 mg total) by mouth daily at 6 PM.  90 tablet  3  . SitaGLIPtin-MetFORMIN HCl 50-1000 MG TB24 Take 2 pills (total 100-2000mg ) daily.  180 tablet  3   No current facility-administered medications on file prior to visit.    EXAM: BP 122/70  Pulse 60  Temp(Src) 98.4 F (36.9 C) (Oral)  Resp 18  Wt 255 lb 4.8 oz (115.803 kg)     Objective:   Physical Exam  Nursing note and vitals reviewed. Constitutional: She is oriented to person, place, and time. She appears well-developed and well-nourished. No distress.  HENT:  Head: Normocephalic and atraumatic.  Eyes: Conjunctivae and EOM are normal. Pupils are equal, round, and reactive to light.  Cardiovascular: Normal rate, regular rhythm and intact distal pulses.   Murmur (aortic region.) heard. Pulmonary/Chest: Effort normal and breath sounds normal. No respiratory distress. She exhibits no tenderness.  Neurological: She is alert and oriented to person, place, and time.  Skin: Skin is warm and dry. No rash noted. She is not diaphoretic. No erythema. No pallor.  Psychiatric: She has a normal mood and affect. Her behavior is normal. Judgment and thought content normal.     Lab Results  Component Value Date   WBC 6.8 08/15/2013   HGB 14.4 08/15/2013   HCT 41.1 08/15/2013   PLT 173 08/15/2013   GLUCOSE 262* 09/05/2013   CHOL 112 08/15/2013   TRIG 91 08/15/2013   HDL 53 08/15/2013   LDLDIRECT 135.1 12/28/2010   LDLCALC 41 08/15/2013   ALT 18 09/05/2013   AST 24 09/05/2013   NA 134* 09/05/2013   K 4.4 09/05/2013   CL 98 09/05/2013   CREATININE 0.76 09/05/2013   BUN 14 09/05/2013   CO2 27 09/05/2013    TSH 1.116 08/15/2013   INR 1.2* 04/12/2010   HGBA1C 7.8* 10/17/2013   MICROALBUR 0.2 10/17/2013       Assessment & Plan:  [name] [name] was seen today for medical clearance.  Diagnoses and associated orders for  this visit:  Aortic stenosis Comments: Needs to have Echo, will order, Clearance for surgery will depend on the severity of AS. - 2D Echocardiogram without contrast; Future  Anxiety Comments: Stable on lexapro and Valium PRN, will continue. - diazepam (VALIUM) 2 MG tablet; Take 1 tablet (2 mg total) by mouth daily as needed for anxiety.  Need for prophylactic vaccination and inoculation against influenza - Flu Vaccine QUAD 36+ mos PF IM (Fluarix Quad PF)  Other Orders - thiamine (VITAMIN B-1) 50 MG tablet; TAKE ONE TABLET BY MOUTH ONCE DAILY    Return precautions provided, and patient handout on AS.  Plan to follow up as needed, or for worsening or persistent symptoms despite treatment.  Patient Instructions  I have refilled your Valium, remember, the new prescription is for 1 pill daily as needed, as this is how you have been taking it.  You will be called to schedule a echo to evaluate your heart murmur.  This must be completed prior to completing your surgical clearance.  If emergency symptoms discussed during visit developed, seek medical attention immediately.  Followup as needed, or for worsening or persistent symptoms despite treatment.

## 2013-11-21 NOTE — Patient Instructions (Addendum)
I have refilled your Valium, remember, the new prescription is for 1 pill daily as needed, as this is how you have been taking it.  You will be called to schedule a echo to evaluate your heart murmur.  This must be completed prior to completing your surgical clearance.  If emergency symptoms discussed during visit developed, seek medical attention immediately.  Followup as needed, or for worsening or persistent symptoms despite treatment.    Aortic Stenosis Aortic stenosis is a narrowing of the aortic valve. The aortic valve is a gatelike structure that is located between the lower left chamber of the heart (left ventricle) and the blood vessel that leads away from the heart (aorta). When the aortic valve is narrowed, it does not open all the way. This makes it hard for the heart to pump blood into the aorta and causes the heart to work harder. The extra work can weaken the heart over time and lead to heart failure. CAUSES  Causes of aortic stenosis include:  Calcium deposits on the aortic valve that have made the valve stiff. This condition generally affects those over the age of 26. It is the most common cause of aortic stenosis.  A birth defect.  Rheumatic fever. This is a problem that may occur after a strep throat infection that was not treated adequately. Rheumatic fever can cause permanent damage to heart valves. SIGNS AND SYMPTOMS  People with aortic stenosis usually have no symptoms until the condition becomes severe. It may take 10-20 years for mild or moderate aortic stenosis to become severe. Symptoms may include:   Shortness of breath, especially with physical activity.   Feeling weak and tired (fatigued) or getting tired easily.  Chest discomfort (angina). This may occur with minimal activity if the aortic stenosis is severe.  An irregular or faster-than-normal heartbeat.  Dizziness or fainting that happens with exertion or after taking certain heart medicines (such  as nitroglycerin). DIAGNOSIS  Aortic stenosis is usually diagnosed with a physical exam and with a type of imaging test called echocardiography. During echocardiography, sound waves are used to evaluate how blood flows through the heart. If your health care provider suspects aortic stenosis but the test does not clearly show it, a procedure called cardiac catheterization may be done to diagnose the condition. Tests may also be done to evaluate heart function. They may include:  Electrocardiography. During this test, the electrical impulses of the heart are recorded while you are lying down and sticky patches are placed on your chest, arms, and legs.  Stress tests. There is more than one type of stress test. If a stress test is needed, ask your health care provider about which type is best for you.  Blood tests. TREATMENT  Treatment depends on how severe the aortic stenosis is, your symptoms, and the problems it is causing.   Observation. If the aortic stenosis is mild, no treatment may be needed. However, you will need to have the condition checked regularly to make sure it is not getting worse or causing serious problems.  Surgery. Surgery to repair or replace the aortic valve is the most common treatment for aortic stenosis. Several types of surgeries are available. The most common are open-heart surgery and transcutaneous aortic valve replacement (TAVR). TAVR does not require that the chest be opened. It is usually performed on elderly patients and those who are not able to have open-heart surgery.  Medicines. Medicines may be given to keep symptoms from getting worse. Medicines cannot reverse  aortic stenosis. HOME CARE INSTRUCTIONS   You may need to avoid certain types of physical activity. If your aortic stenosis is mild, you may need to avoid only strenuous activity. The more severe your aortic stenosis, the more activities you will need to avoid. Talk with your health care provider about  the types of activity you should avoid.  Take medicines only as directed by your health care provider.  If you are a woman with aortic valve stenosis and want to get pregnant, talk to your health care provider before you become pregnant.  If you are a woman with aortic valve stenosis and are pregnant, keep all follow-up visits with all recommended health care providers.  Keep all follow-up visits for tests, exams, and treatments as directed by your health care provider. SEEK IMMEDIATE MEDICAL CARE IF:  You develop chest pain or tightness.   You develop shortness of breath or difficulty breathing.   You develop light-headedness or faint.   It feels like your heartbeat is irregular or faster than normal.  You have a fever. Document Released: 11/06/2002 Document Revised: 06/24/2013 Document Reviewed: 02/02/2012 [facility] Patient Information 2015 [place], [place]. This information is not intended to replace advice given to you by your health care provider. Make sure you discuss any questions you have with your health care provider.

## 2013-11-22 ENCOUNTER — Telehealth: Payer: Self-pay | Admitting: Physician Assistant

## 2013-11-22 ENCOUNTER — Other Ambulatory Visit: Payer: Self-pay

## 2013-11-22 NOTE — Telephone Encounter (Signed)
Pt said how is she going to get the surgical release form filled out . She is asking if she need to see you after she has the echo on Monday.   She is asking for 90 day supply   thiamine (VITAMIN B-1) 50 MG tablet

## 2013-11-25 ENCOUNTER — Ambulatory Visit (HOSPITAL_COMMUNITY)
Admission: RE | Admit: 2013-11-25 | Discharge: 2013-11-25 | Disposition: A | Discharge status: Home / Self Care | Payer: No Typology Code available for payment source | Source: Ambulatory Visit | Attending: Cardiology | Admitting: Cardiology

## 2013-11-25 DIAGNOSIS — I517 Cardiomegaly: Secondary | ICD-10-CM

## 2013-11-25 DIAGNOSIS — E785 Hyperlipidemia, unspecified: Secondary | ICD-10-CM | POA: Diagnosis not present

## 2013-11-25 DIAGNOSIS — Z87891 Personal history of nicotine dependence: Secondary | ICD-10-CM | POA: Insufficient documentation

## 2013-11-25 DIAGNOSIS — I350 Nonrheumatic aortic (valve) stenosis: Secondary | ICD-10-CM

## 2013-11-25 DIAGNOSIS — I358 Other nonrheumatic aortic valve disorders: Secondary | ICD-10-CM | POA: Diagnosis present

## 2013-11-25 DIAGNOSIS — E119 Type 2 diabetes mellitus without complications: Secondary | ICD-10-CM | POA: Diagnosis not present

## 2013-11-25 DIAGNOSIS — I10 Essential (primary) hypertension: Secondary | ICD-10-CM | POA: Diagnosis not present

## 2013-11-25 NOTE — Progress Notes (Signed)
2D Echocardiogram Complete.  11/25/2013   [name] [name] [place], [place]

## 2013-11-26 NOTE — Telephone Encounter (Signed)
She shouldn't need to see me again. She will being seeing the cardiologist this week for surgical release. This should be sufficient. Assuming that the cardiologist clears her for surgery, I will be happy to clear her medically.  She can have a refill of thiamine.

## 2013-11-28 ENCOUNTER — Ambulatory Visit (INDEPENDENT_AMBULATORY_CARE_PROVIDER_SITE_OTHER): Payer: No Typology Code available for payment source | Admitting: Physician Assistant

## 2013-11-28 ENCOUNTER — Encounter: Payer: Self-pay | Admitting: Physician Assistant

## 2013-11-28 VITALS — BP 132/64 | HR 84 | Ht 67.0 in | Wt 254.0 lb

## 2013-11-28 DIAGNOSIS — T733XXS Exhaustion due to excessive exertion, sequela: Secondary | ICD-10-CM | POA: Diagnosis not present

## 2013-11-28 DIAGNOSIS — G4733 Obstructive sleep apnea (adult) (pediatric): Secondary | ICD-10-CM

## 2013-11-28 DIAGNOSIS — E785 Hyperlipidemia, unspecified: Secondary | ICD-10-CM

## 2013-11-28 DIAGNOSIS — E669 Obesity, unspecified: Secondary | ICD-10-CM | POA: Diagnosis not present

## 2013-11-28 DIAGNOSIS — I10 Essential (primary) hypertension: Secondary | ICD-10-CM | POA: Diagnosis not present

## 2013-11-28 NOTE — Assessment & Plan Note (Signed)
On a statin Lipid Panel     Component Value Date/Time   CHOL 112 08/15/2013 1628   TRIG 91 08/15/2013 1628   HDL 53 08/15/2013 1628   CHOLHDL 2.1 08/15/2013 1628   VLDL 18 08/15/2013 1628   LDLCALC 41 08/15/2013 1628   LDLDIRECT 135.1 12/28/2010 1105

## 2013-11-28 NOTE — Progress Notes (Signed)
Date:  11/28/2013   ID:  [name] [name], DOB 1955/06/26, MRN [id number]  PCP:  Zenaida Niece, PA-C  Primary Cardiologist:  Claiborne Billings    History of Present Illness: [name] [name] is a 58 y.o. female who has a 10-15 year history of type 2 diabetes mellitus, hypertension, as well as hyperlipidemia, for, which she's been on medical therapy.  She is on Janumet XR, and an oral 1 mg for her diabetes mellitus. She is treated with simvastatin 40 mg for hyperlipidemia in addition to Zetia 10 mg.   She had lived in [place], but has moved back to the [place] area. In 2011,an adenosine nuclear study was normal. This was done because of episodes of chest pain. An echo Doppler study at that time revealed normal systolic function with mild mitral regurgitation and diminished left ventricular compliance.  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 Actually, year-to-year in half.   She was seen by Dr. Claiborne Billings in June at which time she was noticing progressive fatigue as well as shortness of breath. She also has noticed some sharp left-sided chest discomfort intermittently. additional problems also include hypothyroidism. In addition to her type 2 diabetes mellitus and history of prior depression.   Dr. Claiborne Billings ordered stress test, echocardiogram and sleep study. Patient's insurance would not cover a nuclear stress test but would cover any exercise test however due to her problems with her knee she cannot complete the. Her 2-D echocardiogram revealed an ejection fraction of 60-65% with normal wall motion, mild LVH and normal aortic valve gradients. Sleep study results are below.  She did not meet criteria however, she did not reach REM sleep.  Patient presents today for preop clearance for her right knee. She is to have the plastic piece replaced on the tibial plate.  She still can continue to complain of exertional dyspnea and daytime sleepiness. However she denies nausea,  vomiting, fever, chest pain, shortness of breath, orthopnea, dizziness, PND, cough, congestion, abdominal pain, hematochezia, melena, lower extremity edema, claudication.  Wt Readings from Last 3 Encounters:  11/28/13 254 lb (115.214 kg)  11/21/13 255 lb 4.8 oz (115.803 kg)  10/24/13 248 lb 1.6 oz (112.537 kg)     Past Medical History  Diagnosis Date  . Depression   . GERD (gastroesophageal reflux disease)   . Hypertension   . Hyperlipidemia   . Colon polyps   . Thyroid disease   . History of recurrent UTIs   . Anemia     menstrual related  . Arthritis     right ankle  . Diabetes mellitus 12/2009    type 2  . Alcohol problem drinking     rehab  . Acute meniscal tear of knee     Left knee    Current Outpatient Prescriptions  Medication Sig Dispense Refill  . B Complex Vitamins (B COMPLEX 100 PO) Take 1 tablet by mouth 2 (two) times daily.      . benazepril-hydrochlorthiazide (LOTENSIN HCT) 10-12.5 MG per tablet Take 1 tablet by mouth daily.  90 tablet  1  . cetirizine (ZYRTEC) 10 MG tablet TAKE ONE TABLET BY MOUTH ONCE DAILY  90 tablet  3  . Cholecalciferol (VITAMIN D3) 2000 UNITS TABS Take by mouth. 2500iu daily       . diazepam (VALIUM) 2 MG tablet Take 1 tablet (2 mg total) by mouth daily as needed for anxiety.  30 tablet  0  . escitalopram (LEXAPRO) 20 MG tablet  Take 1 tablet (20 mg total) by mouth daily.  90 tablet  3  . ezetimibe (ZETIA) 10 MG tablet Take 1 tablet (10 mg total) by mouth daily.  90 tablet  3  . glimepiride (AMARYL) 2 MG tablet Take 1 tablet (2 mg total) by mouth daily with breakfast.  90 tablet  3  . IRON PO Take 1 capsule by mouth daily.      Marland Kitchen levothyroxine (SYNTHROID, LEVOTHROID) 137 MCG tablet Take 1 tablet (137 mcg total) by mouth daily.  90 tablet  3  . meloxicam (MOBIC) 15 MG tablet Take 1 tablet (15 mg total) by mouth daily.  30 tablet  3  . mupirocin ointment (BACTROBAN) 2 % Place 1 application into the nose 2 (two) times daily.  22 g  0  .  omeprazole (PRILOSEC) 20 MG capsule Take 20 mg by mouth 2 (two) times daily.      . simvastatin (ZOCOR) 40 MG tablet Take 1 tablet (40 mg total) by mouth daily at 6 PM.  90 tablet  3  . SitaGLIPtin-MetFORMIN HCl 50-1000 MG TB24 Take 2 pills (total 100-2000mg ) daily.  180 tablet  3  . thiamine (VITAMIN B-1) 50 MG tablet TAKE ONE TABLET BY MOUTH ONCE DAILY  90 tablet  1   No current facility-administered medications for this visit.    Allergies:    Allergies  Allergen Reactions  . Adhesive [Tape]     blisters    Social History:  The patient  reports that she quit smoking about 4 years ago. She has never used smokeless tobacco. She reports that she drinks about 12 ounces of alcohol per week. She reports that she does not use illicit drugs.   Family history:   Family History  Problem Relation Age of Onset  . Adopted: Yes  . COPD Mother   . Alcohol abuse Father     ROS:  Please see the history of present illness.  All other systems reviewed and negative.   PHYSICAL EXAM: VS:  BP 132/64  Pulse 84  Ht 5\' 7"  (1.702 m)  Wt 254 lb (115.214 kg)  BMI 39.77 kg/m2 OB, well developed, in no acute distress HEENT: Pupils are equal round react to light accommodation extraocular movements are intact.  Neck: no JVDNo cervical lymphadenopathy. Cardiac: Regular rate and rhythm 1/6 systolic murmur radiating to the carotids. Lungs:  clear to auscultation bilaterally, no wheezing, rhonchi or rales Abd: soft, nontender, positive bowel sounds all quadrants, no hepatosplenomegaly Ext: no lower extremity edema.  2+ radial and dorsalis pedis pulses. Skin: warm and dry Neuro:  Grossly normal  EKG:  NSR 83  ASSESSMENT AND PLAN:  Problem List Items Addressed This Visit   [place] for OSA (obstructive sleep apnea)     IMPRESSION/ RECOMMENDATION:  1) small numbers of obstructive events which do not meet the AHI criteria for the obstructive sleep apnea syndrome. The patient did have mild to moderate  snoring. It should be noted the patient never achieved REM, and therefore her degree of sleep disordered breathing may be underestimated. If the patient is felt to have significant daytime sleepiness that remains unexplained by this study, further testing may be necessary. Clinical correlation is suggested.  2) occasional PVC noted, but no clinically significant arrhythmias were seen.  Follow up with Dr. Claiborne Billings in three months.     Fatigue - Primary     The patient just had her 2-D echocardiogram completed. It revealed an ejection fraction of 60-65%. Normal  wall motion. Mild LVH. Aortic valve showed mean and peak gradients of the 15 mmHg.  Based on his echo findings the patient is low risk for cardiac complications during her upcoming knee surgery. She needs to have the plastic surface of the tibial plate replaced and is scheduled for the end of the month.  We will send a letter to Dr. Alvan Dame.  She will not have a dobutamine stress test is ordered because her insurance will not cover it and she cannot do a treadmill because of her knee.  The cause of her exertional dyspnea is likely related to obesity and deconditioning.  She does not complain of chest pain.      HTN (hypertension)     Blood pressure controlled.    Hyperlipidemia      On a statin Lipid Panel     Component Value Date/Time   CHOL 112 08/15/2013 1628   TRIG 91 08/15/2013 1628   HDL 53 08/15/2013 1628   CHOLHDL 2.1 08/15/2013 1628   VLDL 18 08/15/2013 1628   LDLCALC 41 08/15/2013 1628   LDLDIRECT 135.1 12/28/2010 1105        Obesity (BMI 30-39.9)     I recommended a referral to a registered dietitian.

## 2013-11-28 NOTE — Assessment & Plan Note (Addendum)
The patient just had her 2-D echocardiogram completed. It revealed an ejection fraction of 60-65%. Normal wall motion. Mild LVH. Aortic valve showed mean and peak gradients of the 15 mmHg.  Based on his echo findings the patient is low risk for cardiac complications during her upcoming knee surgery. She needs to have the plastic surface of the tibial plate replaced and is scheduled for the end of the month.  We will send a letter to Dr. Alvan Dame.  She will not have a dobutamine stress test is ordered because her insurance will not cover it and she cannot do a treadmill because of her knee.  The cause of her exertional dyspnea is likely related to obesity and deconditioning.  She does not complain of chest pain.

## 2013-11-28 NOTE — Assessment & Plan Note (Signed)
IMPRESSION/ RECOMMENDATION:  1) small numbers of obstructive events which do not meet the AHI criteria for the obstructive sleep apnea syndrome. The patient did have mild to moderate snoring. It should be noted the patient never achieved REM, and therefore her degree of sleep disordered breathing may be underestimated. If the patient is felt to have significant daytime sleepiness that remains unexplained by this study, further testing may be necessary. Clinical correlation is suggested.  2) occasional PVC noted, but no clinically significant arrhythmias were seen.  Follow up with Dr. Claiborne Billings in three months.

## 2013-11-28 NOTE — Assessment & Plan Note (Signed)
Blood pressure controlled. 

## 2013-11-28 NOTE — Assessment & Plan Note (Signed)
I recommended a referral to a registered dietitian.

## 2013-11-28 NOTE — Patient Instructions (Signed)
Your physician recommends that you schedule a follow-up appointment in: 3 Months Dr Dow Adolph are cleared for your up coming procedure

## 2013-12-03 ENCOUNTER — Encounter: Payer: Self-pay | Admitting: Physician Assistant

## 2013-12-04 ENCOUNTER — Encounter (HOSPITAL_COMMUNITY): Payer: Self-pay | Admitting: Pharmacy Technician

## 2013-12-10 ENCOUNTER — Telehealth: Payer: Self-pay | Admitting: Physician Assistant

## 2013-12-10 NOTE — Patient Instructions (Addendum)
Alexas Basulto  12/10/2013   Your procedure is scheduled on:  12/16/2013    Report thru [facility] entrance .  Follow the Signs to [facility] at   0800     am  Call this number if you have problems the morning of surgery: [phone]   Remember: Eat a good healthy snack prior to bedtime.     Do not eat food or drink liquids after midnight.   Take these medicines the morning of surgery with A SIP OF WATER: Valium if needed, Lexapro, synthroid, Prilosec    Do not wear jewelry, make-up or nail polish.  Do not wear lotions, powders, or perfumes. deodorant.  Do not shave 48 hours prior to surgery.   Do not bring valuables to the hospital.  Contacts, dentures or bridgework may not be worn into surgery.  Leave suitcase in the car. After surgery it may be brought to your room.  For patients admitted to the hospital, checkout time is 11:00 AM the day of  discharge.    [facility] - Preparing for Surgery Before surgery, you can play an important role.  Because skin is not sterile, your skin needs to be as free of germs as possible.  You can reduce the number of germs on your skin by washing with CHG (chlorahexidine gluconate) soap before surgery.  CHG is an antiseptic cleaner which kills germs and bonds with the skin to continue killing germs even after washing. Please DO NOT use if you have an allergy to CHG or antibacterial soaps.  If your skin becomes reddened/irritated stop using the CHG and inform your nurse when you arrive at Short Stay. Do not shave (including legs and underarms) for at least 48 hours prior to the first CHG shower.  You may shave your face/neck. Please follow these instructions carefully:  1.  Shower with CHG Soap the night before surgery and the  morning of Surgery.  2.  If you choose to wash your hair, wash your hair first as usual with your  normal  shampoo.  3.  After you shampoo, rinse your hair and body thoroughly to remove the  shampoo.                            4.  Use CHG as you would any other liquid soap.  You can apply chg directly  to the skin and wash                       Gently with a scrungie or clean washcloth.  5.  Apply the CHG Soap to your body ONLY FROM THE NECK DOWN.   Do not use on face/ open                           Wound or open sores. Avoid contact with eyes, ears mouth and genitals (private parts).                       Wash face,  Genitals (private parts) with your normal soap.             6.  Wash thoroughly, paying special attention to the area where your surgery  will be performed.  7.  Thoroughly rinse your body with warm water from the neck down.  8.  DO NOT shower/wash with your normal soap after using  and rinsing off  the CHG Soap.                9.  Pat yourself dry with a clean towel.            10.  Wear clean pajamas.            11.  Place clean sheets on your bed the night of your first shower and do not  sleep with pets. Day of Surgery : Do not apply any lotions/deodorants the morning of surgery.  Please wear clean clothes to the hospital/surgery center.  FAILURE TO FOLLOW THESE INSTRUCTIONS MAY RESULT IN THE CANCELLATION OF YOUR SURGERY PATIENT SIGNATURE_________________________________  NURSE SIGNATURE__________________________________  ________________________________________________________________________  WHAT IS A BLOOD TRANSFUSION? Blood Transfusion Information  A transfusion is the replacement of blood or some of its parts. Blood is made up of multiple cells which provide different functions.  Red blood cells carry oxygen and are used for blood loss replacement.  White blood cells fight against infection.  Platelets control bleeding.  Plasma helps clot blood.  Other blood products are available for specialized needs, such as hemophilia or other clotting disorders. BEFORE THE TRANSFUSION  Who gives blood for transfusions?   Healthy volunteers who are fully evaluated to make sure their blood  is safe. This is blood bank blood. Transfusion therapy is the safest it has ever been in the practice of medicine. Before blood is taken from a donor, a complete history is taken to make sure that person has no history of diseases nor engages in risky social behavior (examples are intravenous drug use or sexual activity with multiple partners). The donor's travel history is screened to minimize risk of transmitting infections, such as malaria. The donated blood is tested for signs of infectious diseases, such as HIV and hepatitis. The blood is then tested to be sure it is compatible with you in order to minimize the chance of a transfusion reaction. If you or a relative donates blood, this is often done in anticipation of surgery and is not appropriate for emergency situations. It takes many days to process the donated blood. RISKS AND COMPLICATIONS Although transfusion therapy is very safe and saves many lives, the main dangers of transfusion include:   Getting an infectious disease.  Developing a transfusion reaction. This is an allergic reaction to something in the blood you were given. Every precaution is taken to prevent this. The decision to have a blood transfusion has been considered carefully by your caregiver before blood is given. Blood is not given unless the benefits outweigh the risks. AFTER THE TRANSFUSION  Right after receiving a blood transfusion, you will usually feel much better and more energetic. This is especially true if your red blood cells have gotten low (anemic). The transfusion raises the level of the red blood cells which carry oxygen, and this usually causes an energy increase.  The nurse administering the transfusion will monitor you carefully for complications. HOME CARE INSTRUCTIONS  No special instructions are needed after a transfusion. You may find your energy is better. Speak with your caregiver about any limitations on activity for underlying diseases you may  have. SEEK MEDICAL CARE IF:   Your condition is not improving after your transfusion.  You develop redness or irritation at the intravenous (IV) site. SEEK IMMEDIATE MEDICAL CARE IF:  Any of the following symptoms occur over the next 12 hours:  Shaking chills.  You have a temperature by mouth above 102 F (38.9  C), not controlled by medicine.  Chest, back, or muscle pain.  People around you feel you are not acting correctly or are confused.  Shortness of breath or difficulty breathing.  Dizziness and fainting.  You get a rash or develop hives.  You have a decrease in urine output.  Your urine turns a dark color or changes to pink, red, or brown. Any of the following symptoms occur over the next 10 days:  You have a temperature by mouth above 102 F (38.9 C), not controlled by medicine.  Shortness of breath.  Weakness after normal activity.  The white part of the eye turns yellow (jaundice).  You have a decrease in the amount of urine or are urinating less often.  Your urine turns a dark color or changes to pink, red, or brown. Document Released: 02/05/2000 Document Revised: 05/02/2011 Document Reviewed: 09/24/2007 ExitCare Patient Information 2014 [place].  _______________________________________________________________________  Incentive Spirometer  An incentive spirometer is a tool that can help keep your lungs clear and active. This tool measures how well you are filling your lungs with each breath. Taking long deep breaths may help reverse or decrease the chance of developing breathing (pulmonary) problems (especially infection) following:  A long period of time when you are unable to move or be active. BEFORE THE PROCEDURE   If the spirometer includes an indicator to show your best effort, your nurse or respiratory therapist will set it to a desired goal.  If possible, sit up straight or lean slightly forward. Try not to slouch.  Hold the  incentive spirometer in an upright position. INSTRUCTIONS FOR USE  1. Sit on the edge of your bed if possible, or sit up as far as you can in bed or on a chair. 2. Hold the incentive spirometer in an upright position. 3. Breathe out normally. 4. Place the mouthpiece in your mouth and seal your lips tightly around it. 5. Breathe in slowly and as deeply as possible, raising the piston or the ball toward the top of the column. 6. Hold your breath for 3-5 seconds or for as long as possible. Allow the piston or ball to fall to the bottom of the column. 7. Remove the mouthpiece from your mouth and breathe out normally. 8. Rest for a few seconds and repeat Steps 1 through 7 at least 10 times every 1-2 hours when you are awake. Take your time and take a few normal breaths between deep breaths. 9. The spirometer may include an indicator to show your best effort. Use the indicator as a goal to work toward during each repetition. 10. After each set of 10 deep breaths, practice coughing to be sure your lungs are clear. If you have an incision (the cut made at the time of surgery), support your incision when coughing by placing a pillow or rolled up towels firmly against it. Once you are able to get out of bed, walk around indoors and cough well. You may stop using the incentive spirometer when instructed by your caregiver.  RISKS AND COMPLICATIONS  Take your time so you do not get dizzy or light-headed.  If you are in pain, you may need to take or ask for pain medication before doing incentive spirometry. It is harder to take a deep breath if you are having pain. AFTER USE  Rest and breathe slowly and easily.  It can be helpful to keep track of a log of your progress. Your caregiver can provide you with a simple  table to help with this. If you are using the spirometer at home, follow these instructions: [place] IF:   You are having difficultly using the spirometer.  You have trouble using  the spirometer as often as instructed.  Your pain medication is not giving enough relief while using the spirometer.  You develop fever of 100.5 F (38.1 C) or higher. SEEK IMMEDIATE MEDICAL CARE IF:   You cough up bloody sputum that had not been present before.  You develop fever of 102 F (38.9 C) or greater.  You develop worsening pain at or near the incision site. MAKE SURE YOU:   Understand these instructions.  Will watch your condition.  Will get help right away if you are not doing well or get worse. Document Released: 06/20/2006 Document Revised: 05/02/2011 Document Reviewed: 08/21/2006 ExitCare Patient Information 2014 ExitCare, [place].   ________________________________________________________________________    Please read over the following fact sheets that you were given: MRSA Information, coughing and deep breathing exercises, leg exercises

## 2013-12-10 NOTE — Telephone Encounter (Signed)
[facility] [place], [place] - [address] Is requesting re-fill on zaleplon (SONATA) 10 MG capsule

## 2013-12-11 ENCOUNTER — Ambulatory Visit (HOSPITAL_COMMUNITY)
Admission: RE | Admit: 2013-12-11 | Discharge: 2013-12-11 | Disposition: A | Discharge status: Home / Self Care | Payer: No Typology Code available for payment source | Source: Ambulatory Visit | Attending: Orthopedic Surgery | Admitting: Orthopedic Surgery

## 2013-12-11 ENCOUNTER — Encounter (HOSPITAL_COMMUNITY)
Admission: RE | Admit: 2013-12-11 | Discharge: 2013-12-11 | Disposition: A | Discharge status: Home / Self Care | Payer: No Typology Code available for payment source | Source: Ambulatory Visit | Attending: Orthopedic Surgery | Admitting: Orthopedic Surgery

## 2013-12-11 ENCOUNTER — Encounter (HOSPITAL_COMMUNITY): Payer: Self-pay

## 2013-12-11 DIAGNOSIS — Z87891 Personal history of nicotine dependence: Secondary | ICD-10-CM | POA: Insufficient documentation

## 2013-12-11 DIAGNOSIS — E119 Type 2 diabetes mellitus without complications: Secondary | ICD-10-CM | POA: Diagnosis not present

## 2013-12-11 DIAGNOSIS — E780 Pure hypercholesterolemia: Secondary | ICD-10-CM | POA: Insufficient documentation

## 2013-12-11 DIAGNOSIS — I10 Essential (primary) hypertension: Secondary | ICD-10-CM | POA: Insufficient documentation

## 2013-12-11 HISTORY — DX: Shortness of breath: R06.02

## 2013-12-11 HISTORY — DX: Cardiac murmur, unspecified: R01.1

## 2013-12-11 HISTORY — DX: Hypothyroidism, unspecified: E03.9

## 2013-12-11 HISTORY — DX: Anxiety disorder, unspecified: F41.9

## 2013-12-11 LAB — BASIC METABOLIC PANEL
Anion gap: 16 — ABNORMAL HIGH (ref 5–15)
BUN: 11 mg/dL (ref 6–23)
CO2: 26 mEq/L (ref 19–32)
Calcium: 10.9 mg/dL — ABNORMAL HIGH (ref 8.4–10.5)
Chloride: 100 mEq/L (ref 96–112)
Creatinine, Ser: 0.71 mg/dL (ref 0.50–1.10)
GFR calc Af Amer: >90 mL/min (ref >90)
GFR calc non Af Amer: >90 mL/min (ref >90)
Glucose, Bld: 150 mg/dL — ABNORMAL HIGH (ref 70–99)
Potassium: 4.3 mEq/L (ref 3.7–5.3)
Sodium: 142 mEq/L (ref 137–147)

## 2013-12-11 LAB — URINALYSIS, ROUTINE W REFLEX MICROSCOPIC
Bilirubin Urine: NEGATIVE
Glucose, UA: NEGATIVE mg/dL
Hgb urine dipstick: NEGATIVE
Ketones, ur: NEGATIVE mg/dL
Leukocytes, UA: NEGATIVE
Nitrite: NEGATIVE
Protein, ur: NEGATIVE mg/dL
Specific Gravity, Urine: 1.017 (ref 1.005–1.030)
Urobilinogen, UA: 0.2 mg/dL (ref 0.0–1.0)
pH: 5 (ref 5.0–8.0)

## 2013-12-11 LAB — CBC
HCT: 43.5 % (ref 36.0–46.0)
Hemoglobin: 15.3 g/dL — ABNORMAL HIGH (ref 12.0–15.0)
MCH: 33.6 pg (ref 26.0–34.0)
MCHC: 35.2 g/dL (ref 30.0–36.0)
MCV: 95.4 fL (ref 78.0–100.0)
Platelets: 167 10*3/uL (ref 150–400)
RBC: 4.56 MIL/uL (ref 3.87–5.11)
RDW: 13.6 % (ref 11.5–15.5)
WBC: 7.3 10*3/uL (ref 4.0–10.5)

## 2013-12-11 LAB — ABO/RH: ABO/RH(D): A POS

## 2013-12-11 LAB — PROTIME-INR
INR: 1.21 (ref 0.00–1.49)
Prothrombin Time: 15.4 seconds — ABNORMAL HIGH (ref 11.6–15.2)

## 2013-12-11 LAB — APTT: aPTT: 51 seconds — ABNORMAL HIGH (ref 24–37)

## 2013-12-11 LAB — SURGICAL PCR SCREEN
MRSA, PCR: NEGATIVE
Staphylococcus aureus: NEGATIVE

## 2013-12-11 NOTE — Progress Notes (Signed)
PT,PTT results faxed via EPIC to Dr Alvan Dame.

## 2013-12-11 NOTE — Progress Notes (Addendum)
Clearance 11/28/2013 on chart  EKG- 11/28/2013 EPIC  ECHO- 11/25/13 EPIC  Dr Claiborne Billings - 11/28/2013  LOV with PA in EPIC  CXR 12/11/13 EPIC

## 2013-12-11 NOTE — H&P (Signed)
TOTAL KNEE REVISION ADMISSION H&P  Patient is being admitted for right revision total knee arthroplasty and excision of left distal thigh mass.  Subjective:  Chief Complaint:   Right knee pain S/P TKA and mass of the distal left thigh  HPI: [name] [name], 58 y.o. 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 The indications for the revision of the total knee arthroplasty are bearing surface wear leading to symptomatic synovitis. Onset of symptoms was gradual starting 1+ years ago with gradually worsening course since that time.  Prior procedures on the right knee(s) include arthroplasty.  Patient currently rates pain in the right knee(s) at 7 out of 10 with activity. There is worsening of pain with activity and weight bearing, pain that interferes with activities of daily living, pain with passive range of motion, crepitus and joint swelling.  Patient has evidence of previous TKA by imaging studies. This condition presents safety issues increasing the risk of falls.  There is no current active infection. Also presents with a mass of the distal left thigh.  This has been evaluated by her PCP and a dermatologist, all were unable to tell her what it is.  It has been present and bothering her for 3+ months.  It continues to bother her and she is concerned with the color of the area.  She wishes to have the area removed.  Risks, benefits and expectations were discussed with the patient.  Risks including but not limited to the risk of anesthesia, blood clots, nerve damage, blood vessel damage, failure of the prosthesis, infection and up to and including death.  Patient understand the risks, benefits and expectations and  wishes to proceed with surgery.   PCP: Zenaida Niece, PA-C  D/C Plans:      Home with HHPT  Post-op Meds:       No Rx given   Tranexamic Acid:      To be given - IV    Decadron:      is to be given  FYI:     ASA post-op  Norco post-op    Patient Active Problem List   Diagnosis Date Noted  . Fatigue 08/15/2013  . Evalluate for OSA (obstructive sleep apnea) 08/15/2013  . Thyroid nodule 09/17/2012  . Hypothyroid 07/21/2010  . Type II or unspecified type diabetes mellitus without mention of complication, not stated as uncontrolled 06/23/2010  . HTN (hypertension) 06/23/2010  . ETOH abuse 06/23/2010  . Hyperlipidemia 06/23/2010  . Obesity (BMI 30-39.9) 06/23/2010  . Depression 06/23/2010  . PERSONAL HISTORY OF ALCOHOLISM 04/12/2010  . PERSONAL HISTORY OF COLONIC POLYPS 04/12/2010   Past Medical History  Diagnosis Date  . Depression   . GERD (gastroesophageal reflux disease)   . Hypertension   . Hyperlipidemia   . Colon polyps   . Thyroid disease   . History of recurrent UTIs   . Anemia     menstrual related  . Arthritis     right ankle  . Diabetes mellitus 12/2009    type 2  . Alcohol problem drinking     rehab  . Acute meniscal tear of knee     Left knee  . Heart murmur   . Shortness of breath     with exertion   .  Hypothyroidism   . Anxiety     Past Surgical History  Procedure Laterality Date  . Total knee arthroplasty  2009    right  . Arthroscopy left knee  03/11/2011    Surgery was done in [place] for a meniscal tear  . Cesarean section  1987  . Cholecystectomy    . Tubal ligation  1987  . Tonsillectomy    . Adenoidectomy    . Right rotator cuff surgery     . Right ankle surgery       x 2    No prescriptions prior to admission   Allergies  Allergen Reactions  . Adhesive [Tape]     blisters    History  Substance Use Topics  . Smoking status: Former Smoker    Quit date: 02/21/2009  . Smokeless tobacco: Never Used  . Alcohol Use: 8.4  oz/week    14 Cans of beer per week     Comment: quit January 2015 started in august 2015     Family History  Problem Relation Age of Onset  . Adopted: Yes  . COPD Mother   . Alcohol abuse Father      Review of Systems  Constitutional: Negative.   HENT: Negative.   Eyes: Negative.   Respiratory: Positive for shortness of breath (on exertion).   Cardiovascular: Negative.   Gastrointestinal: Positive for heartburn.  Musculoskeletal: Positive for joint pain.  Skin: Negative.   Neurological: Negative.   Endo/Heme/Allergies: Negative.   Psychiatric/Behavioral: Positive for depression. The patient is nervous/anxious.      Objective:  Physical Exam  Constitutional: She is oriented to person, place, and time. She appears well-developed and well-nourished.  HENT:  Head: Normocephalic and atraumatic.  Eyes: Pupils are equal, round, and reactive to light.  Neck: Neck supple. No JVD present. No tracheal deviation present. No thyromegaly present.  Cardiovascular: Normal rate, regular rhythm and intact distal pulses.   Murmur heard. Respiratory: Effort normal and breath sounds normal. No stridor. No respiratory distress. She has no wheezes.  GI: Soft. There is no tenderness. There is no guarding.  Musculoskeletal:       Right knee: She exhibits decreased range of motion, swelling, laceration (healed previous incisions) and bony tenderness. She exhibits no ecchymosis, no deformity, no erythema and normal alignment. Tenderness found.       Legs: Lymphadenopathy:    She has no cervical adenopathy.  Neurological: She is alert and oriented to person, place, and time.  Skin: Skin is warm and dry.  Psychiatric: She has a normal mood and affect.    Vital signs in last 24 hours: Temp:  [98.1 F (36.7 C)] 98.1 F (36.7 C) (10/21 0941) Pulse Rate:  [84] 84 (10/21 0941) Resp:  [16] 16 (10/21 0941) BP: (151)/(68) 151/68 mmHg (10/21 0941) SpO2:  [98 %] 98 % (10/21 0941) Weight:  [113.399  kg (250 lb)] 113.399 kg (250 lb) (10/21 0941)  Labs:  Estimated body mass index is 38.85 kg/(m^2) as calculated from the following:   Height as of 10/14/13: 5\' 7"  (1.702 m).   Weight as of 10/24/13: 112.537 kg (248 lb 1.6 oz).  Imaging Review Plain radiographs demonstrate previous TKA of the right knee(s). The overall alignment is neutral.  The bone quality appears to be good for age and reported activity level.  Assessment/Plan:  Right knee with failed previous arthroplasty. Left distal thigh mass.  The patient history, physical examination, clinical judgment of the provider and imaging studies are consistent with  the right knee(s), previous total knee arthroplasty. Revision total knee arthroplasty is deemed medically necessary. The treatment options including medical management, injection therapy, arthroscopy and revision arthroplasty were discussed at length. The risks and benefits of revision total knee arthroplasty were presented and reviewed. The risks due to aseptic loosening, infection, stiffness, patella tracking problems, thromboembolic complications and other imponderables were discussed.  Excision of the mass of the left distal thigh for further testing and identification.  The patient acknowledged the explanation, agreed to proceed with the plan and consent was signed. Patient is being admitted for inpatient treatment for surgery, pain control, PT, OT, prophylactic antibiotics, VTE prophylaxis, progressive ambulation and ADL's and discharge planning.The patient is planning to be discharged home with home health services.     [name] Cyncere Ruhe   PA-C  12/11/2013, 11:24 AM

## 2013-12-12 MED ORDER — ZALEPLON 10 MG PO CAPS: 10.0000 mg | ORAL_CAPSULE | Freq: Every evening | ORAL | Status: DC | PRN

## 2013-12-12 NOTE — Telephone Encounter (Signed)
Called and spoke with pt; pt states she rarely has to take this medication for sleep.   It has not been filled in a while.  Pls advise if pt can have a refill until her est appointment.

## 2013-12-12 NOTE — Telephone Encounter (Signed)
You may refill until visit

## 2013-12-12 NOTE — Telephone Encounter (Signed)
Faxed to pharmacy

## 2013-12-12 NOTE — Anesthesia Preprocedure Evaluation (Addendum)
Anesthesia Evaluation  Patient identified by MRN, date of birth, ID band Patient awake    Reviewed: Allergy & Precautions, H&P , NPO status , Patient's Chart, lab work & pertinent test results  Airway Mallampati: III  TM Distance: >3 FB Neck ROM: Full    Dental no notable dental hx. (+) Dental Advisory Given   Pulmonary sleep apnea , former smoker,  breath sounds clear to auscultation  Pulmonary exam normal       Cardiovascular hypertension, Pt. on medications Rhythm:Regular Rate:Normal + Systolic murmurs    Neuro/Psych PSYCHIATRIC DISORDERS Anxiety Depression negative neurological ROS     GI/Hepatic GERD-  Medicated and Controlled,(+)     substance abuse  alcohol use,   Endo/Other  diabetes, Type 2, Oral Hypoglycemic AgentsHypothyroidism   Renal/GU negative Renal ROS  negative genitourinary   Musculoskeletal negative musculoskeletal ROS (+)   Abdominal   Peds negative pediatric ROS (+)  Hematology negative hematology ROS (+)   Anesthesia Other Findings   Reproductive/Obstetrics negative OB ROS                             Anesthesia Physical Anesthesia Plan  ASA: III  Anesthesia Plan: General   Post-op Pain Management:    Induction: Intravenous  Airway Management Planned: Oral ETT  Additional Equipment:   Intra-op Plan:   Post-operative Plan: Extubation in OR  Informed Consent: I have reviewed the patients History and Physical, chart, labs and discussed the procedure including the risks, benefits and alternatives for the proposed anesthesia with the patient or authorized representative who has indicated his/her understanding and acceptance.   Dental advisory given  Plan Discussed with: CRNA  Anesthesia Plan Comments:        Anesthesia Quick Evaluation

## 2013-12-16 ENCOUNTER — Encounter (HOSPITAL_COMMUNITY): Payer: Self-pay | Admitting: *Deleted

## 2013-12-16 ENCOUNTER — Inpatient Hospital Stay (HOSPITAL_COMMUNITY)
Admission: RE | Admit: 2013-12-16 | Discharge: 2013-12-18 | DRG: 488 | Disposition: A | Discharge status: Home Health | Payer: No Typology Code available for payment source | Source: Ambulatory Visit | Attending: Orthopedic Surgery | Admitting: Orthopedic Surgery

## 2013-12-16 ENCOUNTER — Ambulatory Visit (HOSPITAL_COMMUNITY): Payer: No Typology Code available for payment source | Admitting: Anesthesiology

## 2013-12-16 ENCOUNTER — Encounter (HOSPITAL_COMMUNITY): Payer: No Typology Code available for payment source | Admitting: Anesthesiology

## 2013-12-16 ENCOUNTER — Encounter (HOSPITAL_COMMUNITY)
Admission: RE | Disposition: A | Discharge status: Home Health | Payer: Self-pay | Source: Ambulatory Visit | Attending: Orthopedic Surgery

## 2013-12-16 DIAGNOSIS — Z87891 Personal history of nicotine dependence: Secondary | ICD-10-CM

## 2013-12-16 DIAGNOSIS — F1021 Alcohol dependence, in remission: Secondary | ICD-10-CM | POA: Diagnosis present

## 2013-12-16 DIAGNOSIS — E1165 Type 2 diabetes mellitus with hyperglycemia: Secondary | ICD-10-CM | POA: Diagnosis present

## 2013-12-16 DIAGNOSIS — E669 Obesity, unspecified: Secondary | ICD-10-CM | POA: Diagnosis present

## 2013-12-16 DIAGNOSIS — E785 Hyperlipidemia, unspecified: Secondary | ICD-10-CM | POA: Diagnosis present

## 2013-12-16 DIAGNOSIS — Z6839 Body mass index (BMI) 39.0-39.9, adult: Secondary | ICD-10-CM | POA: Diagnosis not present

## 2013-12-16 DIAGNOSIS — Z96651 Presence of right artificial knee joint: Secondary | ICD-10-CM | POA: Diagnosis present

## 2013-12-16 DIAGNOSIS — Y792 Prosthetic and other implants, materials and accessory orthopedic devices associated with adverse incidents: Secondary | ICD-10-CM | POA: Diagnosis present

## 2013-12-16 DIAGNOSIS — I82812 Embolism and thrombosis of superficial veins of left lower extremities: Secondary | ICD-10-CM | POA: Diagnosis present

## 2013-12-16 DIAGNOSIS — E039 Hypothyroidism, unspecified: Secondary | ICD-10-CM | POA: Diagnosis present

## 2013-12-16 DIAGNOSIS — T84092A Other mechanical complication of internal right knee prosthesis, initial encounter: Principal | ICD-10-CM | POA: Diagnosis present

## 2013-12-16 DIAGNOSIS — Z96659 Presence of unspecified artificial knee joint: Secondary | ICD-10-CM

## 2013-12-16 DIAGNOSIS — M25561 Pain in right knee: Secondary | ICD-10-CM | POA: Diagnosis present

## 2013-12-16 HISTORY — PX: TOTAL KNEE REVISION: SHX996

## 2013-12-16 HISTORY — PX: MASS EXCISION: SHX2000

## 2013-12-16 LAB — TYPE AND SCREEN
ABO/RH(D): A POS
Antibody Screen: NEGATIVE

## 2013-12-16 LAB — GLUCOSE, CAPILLARY
Glucose-Capillary: 106 mg/dL — ABNORMAL HIGH (ref 70–99)
Glucose-Capillary: 278 mg/dL — ABNORMAL HIGH (ref 70–99)
Glucose-Capillary: 211 mg/dL — ABNORMAL HIGH (ref 70–99)

## 2013-12-16 SURGERY — TOTAL KNEE REVISION
Anesthesia: General | Site: Thigh | Laterality: Right

## 2013-12-16 MED ORDER — NEOSTIGMINE METHYLSULFATE 10 MG/10ML IV SOLN
INTRAVENOUS | Status: AC
  Filled 2013-12-16: qty 1

## 2013-12-16 MED ORDER — PHENOL 1.4 % MT LIQD
1.0000 | OROMUCOSAL | Status: DC | PRN
Start: 2013-12-16 — End: 2013-12-18
  Filled 2013-12-16: qty 177

## 2013-12-16 MED ORDER — METOCLOPRAMIDE HCL 10 MG PO TABS: 5.0000 mg | ORAL_TABLET | Freq: Three times a day (TID) | ORAL | Status: DC | PRN

## 2013-12-16 MED ORDER — ZOLPIDEM TARTRATE 5 MG PO TABS: 5.0000 mg | ORAL_TABLET | Freq: Every evening | ORAL | Status: DC | PRN

## 2013-12-16 MED ORDER — BISACODYL 10 MG RE SUPP
10.0000 mg | Freq: Every day | RECTAL | Status: DC | PRN
Start: 2013-12-16 — End: 2013-12-18

## 2013-12-16 MED ORDER — SUCCINYLCHOLINE CHLORIDE 20 MG/ML IJ SOLN
INTRAMUSCULAR | Status: DC | PRN
  Administered 2013-12-16: 140 mg via INTRAVENOUS

## 2013-12-16 MED ORDER — DEXAMETHASONE SODIUM PHOSPHATE 10 MG/ML IJ SOLN
INTRAMUSCULAR | Status: AC
  Filled 2013-12-16: qty 1

## 2013-12-16 MED ORDER — DEXAMETHASONE SODIUM PHOSPHATE 10 MG/ML IJ SOLN
INTRAMUSCULAR | Status: DC | PRN
  Administered 2013-12-16: 10 mg via INTRAVENOUS

## 2013-12-16 MED ORDER — MENTHOL 3 MG MT LOZG
1.0000 | LOZENGE | OROMUCOSAL | Status: DC | PRN
Start: 2013-12-16 — End: 2013-12-18
  Filled 2013-12-16: qty 9

## 2013-12-16 MED ORDER — SITAGLIPTIN PHOS-METFORMIN HCL 50-1000 MG PO TABS: 2.0000 | ORAL_TABLET | Freq: Every evening | ORAL | Status: DC

## 2013-12-16 MED ORDER — GLYCOPYRROLATE 0.2 MG/ML IJ SOLN
INTRAMUSCULAR | Status: DC | PRN
  Administered 2013-12-16: 0.2 mg via INTRAVENOUS

## 2013-12-16 MED ORDER — GLYCOPYRROLATE 0.2 MG/ML IJ SOLN
INTRAMUSCULAR | Status: AC
  Filled 2013-12-16: qty 3

## 2013-12-16 MED ORDER — INSULIN ASPART 100 UNIT/ML ~~LOC~~ SOLN
0.0000 [IU] | Freq: Three times a day (TID) | SUBCUTANEOUS | Status: DC
  Administered 2013-12-16: 8 [IU] via SUBCUTANEOUS
  Administered 2013-12-17: 3 [IU] via SUBCUTANEOUS
  Administered 2013-12-17: 15 [IU] via SUBCUTANEOUS
  Administered 2013-12-17: 5 [IU] via SUBCUTANEOUS
  Administered 2013-12-18: 3 [IU] via SUBCUTANEOUS

## 2013-12-16 MED ORDER — POLYETHYLENE GLYCOL 3350 17 G PO PACK
17.0000 g | PACK | Freq: Two times a day (BID) | ORAL | Status: DC
  Administered 2013-12-16 – 2013-12-18 (×4): 17 g via ORAL

## 2013-12-16 MED ORDER — PROPOFOL 10 MG/ML IV BOLUS
INTRAVENOUS | Status: AC
  Filled 2013-12-16: qty 20

## 2013-12-16 MED ORDER — FENTANYL CITRATE 0.05 MG/ML IJ SOLN
INTRAMUSCULAR | Status: DC | PRN
  Administered 2013-12-16 (×3): 50 ug via INTRAVENOUS
  Administered 2013-12-16 (×2): 100 ug via INTRAVENOUS
  Administered 2013-12-16: 50 ug via INTRAVENOUS
  Administered 2013-12-16: 100 ug via INTRAVENOUS

## 2013-12-16 MED ORDER — SODIUM CHLORIDE 0.9 % IV SOLN
INTRAVENOUS | Status: DC
  Administered 2013-12-16: 18:00:00 via INTRAVENOUS
  Filled 2013-12-16 (×6): qty 1000

## 2013-12-16 MED ORDER — ONDANSETRON HCL 4 MG/2ML IJ SOLN
INTRAMUSCULAR | Status: AC
  Filled 2013-12-16: qty 2

## 2013-12-16 MED ORDER — GLIMEPIRIDE 2 MG PO TABS
2.0000 mg | ORAL_TABLET | Freq: Every day | ORAL | Status: DC
  Administered 2013-12-17 – 2013-12-18 (×2): 2 mg via ORAL
  Filled 2013-12-16 (×3): qty 1

## 2013-12-16 MED ORDER — LINAGLIPTIN 5 MG PO TABS
5.0000 mg | ORAL_TABLET | Freq: Every day | ORAL | Status: DC
  Administered 2013-12-16 – 2013-12-17 (×2): 5 mg via ORAL
  Filled 2013-12-16 (×3): qty 1

## 2013-12-16 MED ORDER — METOCLOPRAMIDE HCL 5 MG/ML IJ SOLN: 5.0000 mg | Freq: Three times a day (TID) | INTRAMUSCULAR | Status: DC | PRN

## 2013-12-16 MED ORDER — FENTANYL CITRATE 0.05 MG/ML IJ SOLN: 25.0000 ug | INTRAMUSCULAR | Status: DC | PRN

## 2013-12-16 MED ORDER — LEVOTHYROXINE SODIUM 137 MCG PO TABS
137.0000 ug | ORAL_TABLET | Freq: Every day | ORAL | Status: DC
  Administered 2013-12-17 – 2013-12-18 (×2): 137 ug via ORAL
  Filled 2013-12-16 (×3): qty 1

## 2013-12-16 MED ORDER — DOCUSATE SODIUM 100 MG PO CAPS
100.0000 mg | ORAL_CAPSULE | Freq: Two times a day (BID) | ORAL | Status: DC
  Administered 2013-12-16 – 2013-12-18 (×4): 100 mg via ORAL

## 2013-12-16 MED ORDER — DEXAMETHASONE SODIUM PHOSPHATE 10 MG/ML IJ SOLN: 10.0000 mg | Freq: Once | INTRAMUSCULAR | Status: DC

## 2013-12-16 MED ORDER — CEFAZOLIN SODIUM-DEXTROSE 2-3 GM-% IV SOLR
2.0000 g | Freq: Four times a day (QID) | INTRAVENOUS | Status: AC
  Administered 2013-12-16 (×2): 2 g via INTRAVENOUS
  Filled 2013-12-16 (×2): qty 50

## 2013-12-16 MED ORDER — LORATADINE 10 MG PO TABS
10.0000 mg | ORAL_TABLET | Freq: Every day | ORAL | Status: DC
  Administered 2013-12-16 – 2013-12-18 (×3): 10 mg via ORAL
  Filled 2013-12-16 (×3): qty 1

## 2013-12-16 MED ORDER — ONDANSETRON HCL 4 MG/2ML IJ SOLN
INTRAMUSCULAR | Status: DC | PRN
  Administered 2013-12-16: 4 mg via INTRAVENOUS

## 2013-12-16 MED ORDER — FERROUS SULFATE 325 (65 FE) MG PO TABS
325.0000 mg | ORAL_TABLET | Freq: Three times a day (TID) | ORAL | Status: DC
  Administered 2013-12-16 – 2013-12-18 (×5): 325 mg via ORAL
  Filled 2013-12-16 (×8): qty 1

## 2013-12-16 MED ORDER — LACTATED RINGERS IV SOLN
INTRAVENOUS | Status: DC
  Administered 2013-12-16: 1000 mL via INTRAVENOUS

## 2013-12-16 MED ORDER — CISATRACURIUM BESYLATE (PF) 10 MG/5ML IV SOLN
INTRAVENOUS | Status: DC | PRN
  Administered 2013-12-16: 4 mg via INTRAVENOUS
  Administered 2013-12-16: 8 mg via INTRAVENOUS

## 2013-12-16 MED ORDER — CEFAZOLIN SODIUM-DEXTROSE 2-3 GM-% IV SOLR
INTRAVENOUS | Status: AC
  Filled 2013-12-16: qty 50

## 2013-12-16 MED ORDER — ONDANSETRON HCL 4 MG/2ML IJ SOLN
4.0000 mg | Freq: Four times a day (QID) | INTRAMUSCULAR | Status: DC | PRN
Start: 2013-12-16 — End: 2013-12-18

## 2013-12-16 MED ORDER — SODIUM CHLORIDE 0.9 % IJ SOLN
INTRAMUSCULAR | Status: DC | PRN
  Administered 2013-12-16: 30 mL

## 2013-12-16 MED ORDER — HYDROMORPHONE HCL 1 MG/ML IJ SOLN
0.5000 mg | INTRAMUSCULAR | Status: DC | PRN
  Administered 2013-12-16 – 2013-12-17 (×2): 1 mg via INTRAVENOUS
  Filled 2013-12-16 (×2): qty 1

## 2013-12-16 MED ORDER — CELECOXIB 200 MG PO CAPS
200.0000 mg | ORAL_CAPSULE | Freq: Two times a day (BID) | ORAL | Status: DC
  Administered 2013-12-16 – 2013-12-18 (×4): 200 mg via ORAL
  Filled 2013-12-16 (×5): qty 1

## 2013-12-16 MED ORDER — FENTANYL CITRATE 0.05 MG/ML IJ SOLN
INTRAMUSCULAR | Status: AC
  Filled 2013-12-16: qty 5

## 2013-12-16 MED ORDER — CEFAZOLIN SODIUM-DEXTROSE 2-3 GM-% IV SOLR
2.0000 g | INTRAVENOUS | Status: AC
  Administered 2013-12-16: 2 g via INTRAVENOUS

## 2013-12-16 MED ORDER — METHOCARBAMOL 500 MG PO TABS
500.0000 mg | ORAL_TABLET | Freq: Four times a day (QID) | ORAL | Status: DC | PRN
Start: 2013-12-16 — End: 2013-12-18
  Administered 2013-12-17 (×2): 500 mg via ORAL
  Filled 2013-12-16 (×2): qty 1

## 2013-12-16 MED ORDER — KETOROLAC TROMETHAMINE 30 MG/ML IJ SOLN
INTRAMUSCULAR | Status: AC
  Filled 2013-12-16: qty 1

## 2013-12-16 MED ORDER — PROPOFOL 10 MG/ML IV BOLUS
INTRAVENOUS | Status: DC | PRN
  Administered 2013-12-16: 175 mg via INTRAVENOUS

## 2013-12-16 MED ORDER — DIAZEPAM 2 MG PO TABS: 2.0000 mg | ORAL_TABLET | Freq: Every day | ORAL | Status: DC | PRN

## 2013-12-16 MED ORDER — DEXAMETHASONE SODIUM PHOSPHATE 10 MG/ML IJ SOLN
10.0000 mg | Freq: Once | INTRAMUSCULAR | Status: AC
  Administered 2013-12-17: 10 mg via INTRAVENOUS
  Filled 2013-12-16: qty 1

## 2013-12-16 MED ORDER — BUPIVACAINE-EPINEPHRINE (PF) 0.25% -1:200000 IJ SOLN
INTRAMUSCULAR | Status: AC
  Filled 2013-12-16: qty 30

## 2013-12-16 MED ORDER — EZETIMIBE 10 MG PO TABS
10.0000 mg | ORAL_TABLET | Freq: Every day | ORAL | Status: DC
Start: 2013-12-16 — End: 2013-12-18
  Administered 2013-12-16 – 2013-12-18 (×3): 10 mg via ORAL
  Filled 2013-12-16 (×3): qty 1

## 2013-12-16 MED ORDER — METHOCARBAMOL 1000 MG/10ML IJ SOLN
500.0000 mg | Freq: Four times a day (QID) | INTRAVENOUS | Status: DC | PRN
  Administered 2013-12-16: 500 mg via INTRAVENOUS
  Filled 2013-12-16: qty 5

## 2013-12-16 MED ORDER — MIDAZOLAM HCL 2 MG/2ML IJ SOLN
INTRAMUSCULAR | Status: AC
  Filled 2013-12-16: qty 2

## 2013-12-16 MED ORDER — ALUM & MAG HYDROXIDE-SIMETH 200-200-20 MG/5ML PO SUSP
30.0000 mL | ORAL | Status: DC | PRN
Start: 2013-12-16 — End: 2013-12-18

## 2013-12-16 MED ORDER — FENTANYL CITRATE 0.05 MG/ML IJ SOLN
INTRAMUSCULAR | Status: AC
  Filled 2013-12-16: qty 2

## 2013-12-16 MED ORDER — 0.9 % SODIUM CHLORIDE (POUR BTL) OPTIME
TOPICAL | Status: DC | PRN
  Administered 2013-12-16: 1000 mL

## 2013-12-16 MED ORDER — CISATRACURIUM BESYLATE 20 MG/10ML IV SOLN
INTRAVENOUS | Status: AC
  Filled 2013-12-16: qty 10

## 2013-12-16 MED ORDER — HYDROMORPHONE HCL 1 MG/ML IJ SOLN
INTRAMUSCULAR | Status: DC | PRN
  Administered 2013-12-16 (×2): 1 mg via INTRAVENOUS

## 2013-12-16 MED ORDER — NEOSTIGMINE METHYLSULFATE 10 MG/10ML IV SOLN
INTRAVENOUS | Status: DC | PRN
  Administered 2013-12-16: 1 mg via INTRAVENOUS

## 2013-12-16 MED ORDER — SIMVASTATIN 40 MG PO TABS
40.0000 mg | ORAL_TABLET | Freq: Every evening | ORAL | Status: DC
  Administered 2013-12-16 – 2013-12-17 (×2): 40 mg via ORAL
  Filled 2013-12-16 (×3): qty 1

## 2013-12-16 MED ORDER — SODIUM CHLORIDE 0.9 % IR SOLN
Status: DC | PRN
  Administered 2013-12-16: 1000 mL

## 2013-12-16 MED ORDER — ESCITALOPRAM OXALATE 20 MG PO TABS
20.0000 mg | ORAL_TABLET | Freq: Every day | ORAL | Status: DC
  Administered 2013-12-17 – 2013-12-18 (×2): 20 mg via ORAL
  Filled 2013-12-16 (×2): qty 1

## 2013-12-16 MED ORDER — ONDANSETRON HCL 4 MG/2ML IJ SOLN: 4.0000 mg | Freq: Once | INTRAMUSCULAR | Status: DC | PRN

## 2013-12-16 MED ORDER — TRANEXAMIC ACID 100 MG/ML IV SOLN
1000.0000 mg | Freq: Once | INTRAVENOUS | Status: AC
  Administered 2013-12-16: 1000 mg via INTRAVENOUS
  Filled 2013-12-16: qty 10

## 2013-12-16 MED ORDER — KETOROLAC TROMETHAMINE 30 MG/ML IJ SOLN
INTRAMUSCULAR | Status: DC | PRN
  Administered 2013-12-16: 30 mg

## 2013-12-16 MED ORDER — ASPIRIN EC 325 MG PO TBEC
325.0000 mg | DELAYED_RELEASE_TABLET | Freq: Two times a day (BID) | ORAL | Status: DC
  Administered 2013-12-17 – 2013-12-18 (×3): 325 mg via ORAL
  Filled 2013-12-16 (×5): qty 1

## 2013-12-16 MED ORDER — HYDROMORPHONE HCL 2 MG/ML IJ SOLN
INTRAMUSCULAR | Status: AC
  Filled 2013-12-16: qty 1

## 2013-12-16 MED ORDER — MIDAZOLAM HCL 5 MG/5ML IJ SOLN
INTRAMUSCULAR | Status: DC | PRN
  Administered 2013-12-16 (×2): 1 mg via INTRAVENOUS

## 2013-12-16 MED ORDER — MAGNESIUM CITRATE PO SOLN: 1.0000 | Freq: Once | ORAL | Status: AC | PRN

## 2013-12-16 MED ORDER — PANTOPRAZOLE SODIUM 40 MG PO TBEC
40.0000 mg | DELAYED_RELEASE_TABLET | Freq: Every day | ORAL | Status: DC
  Administered 2013-12-16 – 2013-12-18 (×3): 40 mg via ORAL
  Filled 2013-12-16 (×3): qty 1

## 2013-12-16 MED ORDER — LACTATED RINGERS IV SOLN
INTRAVENOUS | Status: DC | PRN
  Administered 2013-12-16 (×2): via INTRAVENOUS

## 2013-12-16 MED ORDER — CHLORHEXIDINE GLUCONATE 4 % EX LIQD: 60.0000 mL | Freq: Once | CUTANEOUS | Status: DC

## 2013-12-16 MED ORDER — SODIUM CHLORIDE 0.9 % IJ SOLN
INTRAMUSCULAR | Status: AC
  Filled 2013-12-16: qty 50

## 2013-12-16 MED ORDER — DIPHENHYDRAMINE HCL 25 MG PO CAPS: 25.0000 mg | ORAL_CAPSULE | Freq: Four times a day (QID) | ORAL | Status: DC | PRN

## 2013-12-16 MED ORDER — LIDOCAINE HCL (CARDIAC) 20 MG/ML IV SOLN
INTRAVENOUS | Status: DC | PRN
  Administered 2013-12-16: 75 mg via INTRAVENOUS

## 2013-12-16 MED ORDER — METFORMIN HCL 500 MG PO TABS
2000.0000 mg | ORAL_TABLET | Freq: Every day | ORAL | Status: DC
  Administered 2013-12-16 – 2013-12-17 (×2): 2000 mg via ORAL
  Filled 2013-12-16 (×3): qty 4

## 2013-12-16 MED ORDER — BUPIVACAINE-EPINEPHRINE (PF) 0.25% -1:200000 IJ SOLN
INTRAMUSCULAR | Status: DC | PRN
  Administered 2013-12-16: 30 mL

## 2013-12-16 MED ORDER — METOCLOPRAMIDE HCL 5 MG/ML IJ SOLN
INTRAMUSCULAR | Status: AC
  Filled 2013-12-16: qty 2

## 2013-12-16 MED ORDER — HYDROCODONE-ACETAMINOPHEN 7.5-325 MG PO TABS
1.0000 | ORAL_TABLET | ORAL | Status: DC
  Administered 2013-12-16 – 2013-12-17 (×4): 1 via ORAL
  Administered 2013-12-17 (×2): 2 via ORAL
  Administered 2013-12-17 – 2013-12-18 (×4): 1 via ORAL
  Administered 2013-12-18 (×2): 2 via ORAL
  Filled 2013-12-16 (×2): qty 2
  Filled 2013-12-16: qty 1
  Filled 2013-12-16: qty 2
  Filled 2013-12-16 (×2): qty 1
  Filled 2013-12-16: qty 2
  Filled 2013-12-16 (×3): qty 1
  Filled 2013-12-16: qty 2
  Filled 2013-12-16 (×2): qty 1

## 2013-12-16 MED ORDER — ONDANSETRON HCL 4 MG PO TABS: 4.0000 mg | ORAL_TABLET | Freq: Four times a day (QID) | ORAL | Status: DC | PRN

## 2013-12-16 SURGICAL SUPPLY — 61 items
BAG ZIPLOCK 12X15 (MISCELLANEOUS) IMPLANT
BANDAGE ELASTIC 6 VELCRO ST LF (GAUZE/BANDAGES/DRESSINGS) ×4 IMPLANT
BANDAGE ESMARK 6X9 LF (GAUZE/BANDAGES/DRESSINGS) ×2 IMPLANT
BEARING TIBIAL SZ4 13MM KNEE (Insert) ×2 IMPLANT
BNDG COHESIVE 6X5 TAN STRL LF (GAUZE/BANDAGES/DRESSINGS) ×4 IMPLANT
BNDG ESMARK 6X9 LF (GAUZE/BANDAGES/DRESSINGS) ×4
CUFF TOURN SGL QUICK 34 (TOURNIQUET CUFF) ×4
CUFF TRNQT CYL 34X4X40X1 (TOURNIQUET CUFF) ×4 IMPLANT
DERMABOND ADVANCED (GAUZE/BANDAGES/DRESSINGS) ×4
DERMABOND ADVANCED .7 DNX12 (GAUZE/BANDAGES/DRESSINGS) ×4 IMPLANT
DRAPE EXTREMITY BILATERAL (DRAPE) ×4 IMPLANT
DRAPE INCISE IOBAN 66X45 STRL (DRAPES) ×4 IMPLANT
DRAPE POUCH INSTRU U-SHP 10X18 (DRAPES) ×4 IMPLANT
DRAPE SHEET LG 3/4 BI-LAMINATE (DRAPES) ×4 IMPLANT
DRAPE U-SHAPE 47X51 STRL (DRAPES) ×8 IMPLANT
DRSG AQUACEL AG ADV 3.5X 4 (GAUZE/BANDAGES/DRESSINGS) ×4 IMPLANT
DRSG AQUACEL AG ADV 3.5X10 (GAUZE/BANDAGES/DRESSINGS) ×4 IMPLANT
DURAPREP 26ML APPLICATOR (WOUND CARE) ×8 IMPLANT
ELECT REM PT RETURN 9FT ADLT (ELECTROSURGICAL) ×4
ELECTRODE REM PT RTRN 9FT ADLT (ELECTROSURGICAL) ×2 IMPLANT
FACESHIELD WRAPAROUND (MASK) ×16 IMPLANT
GLOVE BIO SURGEON STRL SZ8 (GLOVE) ×4 IMPLANT
GLOVE BIOGEL PI IND STRL 6.5 (GLOVE) ×2 IMPLANT
GLOVE BIOGEL PI IND STRL 7.0 (GLOVE) ×2 IMPLANT
GLOVE BIOGEL PI IND STRL 7.5 (GLOVE) ×2 IMPLANT
GLOVE BIOGEL PI IND STRL 8.5 (GLOVE) ×4 IMPLANT
GLOVE BIOGEL PI INDICATOR 6.5 (GLOVE) ×2
GLOVE BIOGEL PI INDICATOR 7.0 (GLOVE) ×2
GLOVE BIOGEL PI INDICATOR 7.5 (GLOVE) ×2
GLOVE BIOGEL PI INDICATOR 8.5 (GLOVE) ×4
GLOVE ECLIPSE 8.0 STRL XLNG CF (GLOVE) ×4 IMPLANT
GLOVE ORTHO TXT STRL SZ7.5 (GLOVE) ×8 IMPLANT
GLOVE SURG SS PI 7.0 STRL IVOR (GLOVE) ×4 IMPLANT
GOWN STRL REUS W/TWL 2XL LVL3 (GOWN DISPOSABLE) ×4 IMPLANT
GOWN STRL REUS W/TWL LRG LVL3 (GOWN DISPOSABLE) ×4 IMPLANT
GOWN STRL REUS W/TWL XL LVL3 (GOWN DISPOSABLE) ×8 IMPLANT
HANDPIECE INTERPULSE COAX TIP (DISPOSABLE) ×2
KIT BASIN OR (CUSTOM PROCEDURE TRAY) ×4 IMPLANT
MANIFOLD NEPTUNE II (INSTRUMENTS) ×4 IMPLANT
NDL SAFETY ECLIPSE 18X1.5 (NEEDLE) ×2 IMPLANT
NEEDLE HYPO 18GX1.5 SHARP (NEEDLE) ×2
PACK TOTAL JOINT (CUSTOM PROCEDURE TRAY) ×4 IMPLANT
POSITIONER SURGICAL ARM (MISCELLANEOUS) ×4 IMPLANT
SET HNDPC FAN SPRY TIP SCT (DISPOSABLE) ×2 IMPLANT
SET PAD KNEE POSITIONER (MISCELLANEOUS) ×4 IMPLANT
STOCKINETTE 8 INCH (MISCELLANEOUS) ×4 IMPLANT
SUCTION FRAZIER 12FR DISP (SUCTIONS) ×4 IMPLANT
SUT MNCRL AB 3-0 PS2 18 (SUTURE) ×4 IMPLANT
SUT MNCRL AB 4-0 PS2 18 (SUTURE) ×4 IMPLANT
SUT VIC AB 1 CT1 27 (SUTURE) ×2
SUT VIC AB 1 CT1 27XBRD ANTBC (SUTURE) ×2 IMPLANT
SUT VIC AB 1 CT1 36 (SUTURE) ×4 IMPLANT
SUT VIC AB 2-0 CT1 27 (SUTURE) ×6
SUT VIC AB 2-0 CT1 TAPERPNT 27 (SUTURE) ×6 IMPLANT
SUT VLOC 180 0 24IN GS25 (SUTURE) ×4 IMPLANT
SYR 50ML LL SCALE MARK (SYRINGE) ×4 IMPLANT
TIBIAL BEARING SZ4 13MM KNEE (Insert) ×4 IMPLANT
TOWEL OR 17X26 10 PK STRL BLUE (TOWEL DISPOSABLE) ×4 IMPLANT
TOWEL OR NON WOVEN STRL DISP B (DISPOSABLE) ×4 IMPLANT
TRAY FOLEY CATH 14FRSI W/METER (CATHETERS) ×4 IMPLANT
WRAP KNEE MAXI GEL POST OP (GAUZE/BANDAGES/DRESSINGS) ×4 IMPLANT

## 2013-12-16 NOTE — Transfer of Care (Signed)
Immediate Anesthesia Transfer of Care Note  Patient: [name] [name]  Procedure(s) Performed: Procedure(s): RIGHT TOTAL KNEE REVISION POLY EXCHANGE (Right) EXCISION LEFT  DISTAL THIGH MASS (Left)  Patient Location: PACU  Anesthesia Type:General  Level of Consciousness: awake, alert , oriented and patient cooperative  Airway & Oxygen Therapy: Patient Spontanous Breathing and Patient connected to face mask oxygen  Post-op Assessment: Report given to PACU RN, Post -op Vital signs reviewed and stable and Patient moving all extremities  Post vital signs: Reviewed and stable  Complications: No apparent anesthesia complications

## 2013-12-16 NOTE — Interval H&P Note (Signed)
History and Physical Interval Note:  12/16/2013 11:48 AM  [name] [name]  has presented today for surgery, with the diagnosis of [place].LEFT DISTAL THIGH MASS  The various methods of treatment have been discussed with the patient and family. After consideration of risks, benefits and other options for treatment, the patient has consented to  Procedure(s): RIGHT TOTAL KNEE ARTHROPLASTY REVISION (Right) EXCISION LEFT  DISTAL THIGH MASS (Left) as a surgical intervention .  The patient's history has been reviewed, patient examined, no change in status, stable for surgery.  I have reviewed the patient's chart and labs.  Questions were answered to the patient's satisfaction.     Mauri Pole

## 2013-12-16 NOTE — Anesthesia Postprocedure Evaluation (Signed)
  Anesthesia Post-op Note  Patient: [name] [name]  Procedure(s) Performed: Procedure(s) (LRB): RIGHT TOTAL KNEE REVISION POLY EXCHANGE (Right) EXCISION LEFT  DISTAL THIGH MASS (Left)  Patient Location: PACU  Anesthesia Type: General  Level of Consciousness: awake and alert   Airway and Oxygen Therapy: Patient Spontanous Breathing  Post-op Pain: mild  Post-op Assessment: Post-op Vital signs reviewed, Patient's Cardiovascular Status Stable, Respiratory Function Stable, Patent Airway and No signs of Nausea or vomiting  Last Vitals:  Filed Vitals:   12/16/13 1600  BP:   Pulse: 85  Temp: 37.4 C  Resp: 14    Post-op Vital Signs: stable   Complications: No apparent anesthesia complications

## 2013-12-16 NOTE — Anesthesia Procedure Notes (Signed)
Procedure Name: Intubation Date/Time: 12/16/2013 12:00 PM Performed by: Ofilia Neas Pre-anesthesia Checklist: Patient identified, Timeout performed, Emergency Drugs available, Suction available and Patient being monitored Patient Re-evaluated:Patient Re-evaluated prior to inductionOxygen Delivery Method: Circle system utilized Preoxygenation: Pre-oxygenation with 100% oxygen Intubation Type: IV induction and Cricoid Pressure applied Ventilation: Mask ventilation without difficulty Laryngoscope Size: Mac and 4 Grade View: Grade II Tube type: Oral Tube size: 7.5 mm Number of attempts: 1 Airway Equipment and Method: Stylet Secured at: 21 cm Tube secured with: Tape Difficulty Due To: Difficulty was anticipated, Difficult Airway- due to large tongue, Difficult Airway- due to reduced neck mobility, Difficult Airway- due to dentition, Difficult Airway- due to limited oral opening and Difficult Airway- due to anterior larynx Future Recommendations: Recommend- induction with short-acting agent, and alternative techniques readily available

## 2013-12-16 NOTE — Brief Op Note (Signed)
12/16/2013  1:57 PM  PATIENT:  [name] [name]  58 y.o. female  PRE-OPERATIVE DIAGNOSIS:  1. 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  LEFT DISTAL THIGH MASS  POST-OPERATIVE DIAGNOSIS:  1. 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 LEFT DISTAL THIGH MASS, likely thrombosed superficial vein  PROCEDURE:  Procedure(s): 1.  RIGHT TOTAL KNEE REVISION POLY EXCHANGE (Right), Stryker 2.  EXCISION LEFT  DISTAL THIGH MASS (Left)  SURGEON:  Surgeon(s) and Role:    * Mauri Pole, MD - Primary  PHYSICIAN ASSISTANT: Danae Orleans, PA-C  ANESTHESIA:   general  EBL:  Total I/O In: -  Out: 200 [Urine:150; Blood:50]  BLOOD ADMINISTERED:none  DRAINS: none   LOCAL MEDICATIONS USED:  MARCAINE, right knee  SPECIMEN:  No Specimen  DISPOSITION OF SPECIMEN:  N/A  COUNTS:  YES  TOURNIQUET:   Total Tourniquet Time Documented: Thigh (Right) - 43 minutes Total: Thigh (Right) - 43 minutes  Thigh (Left) - 5 minutes Total: Thigh (Left) - 5 minutes   DICTATION: .Other Dictation: Dictation Number [id number]  PLAN OF CARE: Admit to inpatient   PATIENT DISPOSITION:  PACU - hemodynamically stable.   Delay start of Pharmacological VTE agent (>24hrs) due to surgical blood loss or risk of bleeding: no

## 2013-12-17 LAB — CBC
HCT: 38.3 % (ref 36.0–46.0)
Hemoglobin: 13.4 g/dL (ref 12.0–15.0)
MCH: 33.3 pg (ref 26.0–34.0)
MCHC: 35 g/dL (ref 30.0–36.0)
MCV: 95.3 fL (ref 78.0–100.0)
Platelets: 147 10*3/uL — ABNORMAL LOW (ref 150–400)
RBC: 4.02 MIL/uL (ref 3.87–5.11)
RDW: 13.3 % (ref 11.5–15.5)
WBC: 7.4 10*3/uL (ref 4.0–10.5)

## 2013-12-17 LAB — GLUCOSE, CAPILLARY
Glucose-Capillary: 227 mg/dL — ABNORMAL HIGH (ref 70–99)
Glucose-Capillary: 431 mg/dL — ABNORMAL HIGH (ref 70–99)
Glucose-Capillary: 341 mg/dL — ABNORMAL HIGH (ref 70–99)

## 2013-12-17 LAB — BASIC METABOLIC PANEL
Anion gap: 13 (ref 5–15)
BUN: 18 mg/dL (ref 6–23)
CO2: 25 mEq/L (ref 19–32)
Calcium: 8.8 mg/dL (ref 8.4–10.5)
Chloride: 95 mEq/L — ABNORMAL LOW (ref 96–112)
Creatinine, Ser: 0.85 mg/dL (ref 0.50–1.10)
GFR calc Af Amer: 85 mL/min — ABNORMAL LOW (ref >90)
GFR calc non Af Amer: 74 mL/min — ABNORMAL LOW (ref >90)
Glucose, Bld: 245 mg/dL — ABNORMAL HIGH (ref 70–99)
Potassium: 5.1 mEq/L (ref 3.7–5.3)
Sodium: 133 mEq/L — ABNORMAL LOW (ref 137–147)

## 2013-12-17 LAB — GLUCOSE, RANDOM: Glucose, Bld: 355 mg/dL — ABNORMAL HIGH (ref 70–99)

## 2013-12-17 MED ORDER — DSS 100 MG PO CAPS: 100.0000 mg | ORAL_CAPSULE | Freq: Two times a day (BID) | ORAL | Status: DC

## 2013-12-17 MED ORDER — HYDROCHLOROTHIAZIDE 12.5 MG PO CAPS
12.5000 mg | ORAL_CAPSULE | Freq: Every day | ORAL | Status: DC
  Administered 2013-12-17 – 2013-12-18 (×2): 12.5 mg via ORAL
  Filled 2013-12-17 (×3): qty 1

## 2013-12-17 MED ORDER — BENAZEPRIL HCL 10 MG PO TABS
10.0000 mg | ORAL_TABLET | Freq: Every day | ORAL | Status: DC
  Administered 2013-12-17 – 2013-12-18 (×2): 10 mg via ORAL
  Filled 2013-12-17 (×2): qty 1

## 2013-12-17 MED ORDER — POLYETHYLENE GLYCOL 3350 17 G PO PACK: 17.0000 g | PACK | Freq: Two times a day (BID) | ORAL | Status: DC

## 2013-12-17 MED ORDER — HYDROCODONE-ACETAMINOPHEN 7.5-325 MG PO TABS: 1.0000 | ORAL_TABLET | ORAL | Status: DC | PRN

## 2013-12-17 MED ORDER — ASPIRIN 325 MG PO TBEC: 325.0000 mg | DELAYED_RELEASE_TABLET | Freq: Two times a day (BID) | ORAL | Status: AC

## 2013-12-17 MED ORDER — FERROUS SULFATE 325 (65 FE) MG PO TABS: 325.0000 mg | ORAL_TABLET | Freq: Three times a day (TID) | ORAL | Status: DC

## 2013-12-17 MED ORDER — INSULIN ASPART 100 UNIT/ML ~~LOC~~ SOLN
5.0000 [IU] | Freq: Once | SUBCUTANEOUS | Status: AC
  Administered 2013-12-17: 5 [IU] via SUBCUTANEOUS

## 2013-12-17 MED ORDER — METHOCARBAMOL 500 MG PO TABS: 500.0000 mg | ORAL_TABLET | Freq: Four times a day (QID) | ORAL | Status: DC | PRN

## 2013-12-17 NOTE — Progress Notes (Signed)
Patient ID: [name] [name], female   DOB: 02-26-1955, 58 y.o.   MRN: [id number] Subjective: 1 Day Post-Op Procedure(s) (LRB): RIGHT TOTAL KNEE REVISION POLY EXCHANGE (Right) EXCISION LEFT  DISTAL THIGH MASS (Left)    Patient reports pain as mild to moderate, no major complaints.  Did not sleep last night due to interruptions more so than due to pain  Objective:   VITALS:   Filed Vitals:   12/17/13 1011  BP: 147/56  Pulse: 76  Temp: 98.3 F (36.8 C)  Resp: 16    Neurovascular intact Incision: dressing C/D/I  LABS  Recent Labs  12/17/13 0445  HGB 13.4  HCT 38.3  WBC 7.4  PLT 147*     Recent Labs  12/17/13 0445  NA 133*  K 5.1  BUN 18  CREATININE 0.85  GLUCOSE 245*    No results found for this basename: LABPT, INR,  in the last 72 hours   Assessment/Plan: 1 Day Post-Op Procedure(s) (LRB): RIGHT TOTAL KNEE REVISION POLY EXCHANGE (Right) EXCISION LEFT  DISTAL THIGH MASS (Left)   Advance diet Up with therapy Discharge home with home health today or tomorrow.  She expressed that she may want to stay another day, I suggested she see how therapy goes today  RTC in 2 weeks  Reviewed findings in OR particularly with regards to the left thigh - clotted superficial vein

## 2013-12-17 NOTE — Op Note (Signed)
[name] [name], [name] [name] NO.:  [id number]  MEDICAL RECORD NO.:  [id number]  LOCATION:  3329                         FACILITY:  [facility]  PHYSICIAN:  Pietro Cassis. Alvan Dame, M.D.  DATE OF BIRTH:  August 26, 1955  DATE OF PROCEDURE:  12/16/2013 DATE OF DISCHARGE:                              OPERATIVE REPORT   PREOPERATIVE DIAGNOSIS: 1. 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 2. Left distal thigh mass.  POSTOPERATIVE DIAGNOSIS/FINDINGS: 1. Status post right total knee arthroplasty with instability and     pain. 2. Left knee distal thigh mass, appears to be a thrombosed superficial     vein that was hypersensitive and painful.  PROCEDURE: 1. Revision of right total knee with polyethylene up sized from a size     9 mm to 13 mm posterior cruciate retaining knee insert for Stryker     Triathlon knee system. 2. Excision of left distal thigh mass with findings most consistent     with thrombosed vein and old blood within it.  SURGEON:  Pietro Cassis. Alvan Dame, M.D.  ASSISTANT:  Danae Orleans, PA-C.  Note that, Mr. [name] [name] is present for the entirety of the case, mainly for the right knee surgery.  He was present for the preoperative positioning and prepping and draping, management of the operative extremity, general facilitation of the case, primary wound closure.  He was not necessary for the second procedure.  ANESTHESIA:  General.  SPECIMENS:  None.  COMPLICATIONS:  None.  DRAINS:  None.  TOURNIQUET TIME:  On the right was 43 minutes at 300 mmHg and on the left knee, it was up for 5 minutes at 250 mmHg.  BLOOD LOSS:  Probably a total of less than 150 mL.  INDICATIONS FOR PROCEDURE:  [name] [name] is a 58 year old female with a history of right total knee arthroplasty performed in [place].  She had moved to [place] and was seen and evaluated with a painful right knee with exam findings consistent with some instability from extension to flexion.   Given the persistence and recurrence of her problems despite conservative attempts at strengthening efforts with therapy and brace wearing, she wished to proceed with surgery for polyethylene revision to upsize and stabilize the medial and lateral collateral ligaments.  The risks of recurrence of the instability discussed, the importance of postop recovery in terms of strengthening all reviewed and stressed.  Standard risks of infection, DVT, need for future revision of surgery were all discussed and reviewed, consent was obtained.  In addition, she had complaint of left medial base distal thigh mass.  It had a bluish hue and was very sensitive around to touch.  She wished to have it excised.  Upon review of this with her on exam, I discussed excision of it and if it appeared to be suspicious at all, we will send to pathology, but it appeared to be a superficial vein that was thrombosed.  Consent was obtained, reviewing this as well in terms of infection, recurrence or concerns for malignancy.  Consent was obtained for both of these procedures.  PROCEDURE IN DETAIL:  The patient was brought to the operative theater. Once adequate anesthesia, preoperative antibiotics,  and Ancef administered, she was positioned supine.  Bilateral thigh tourniquets were placed.  Both lower extremities were then prepped and draped in sterile fashion with the right leg placed in DeMayo leg holder.  Time- out was performed identifying the patient, planned procedures, and the extremities.  Time-outs were done separately for the right and left knee.  Attention was first directed to the right knee.  The leg was exsanguinated, tourniquet elevated to 250 mmHg.  A midline incision was used for re-exposure.  Soft tissue plane was created.  A median arthrotomy was made.  She was noted to have significant amount of scarring.  First part of the procedure was excising a lot of scar over the knee medially and laterally  for exposure.  In particular, I found a lot of scar on the patella for which I excised, also debrided some of the soft tissues of the lateral facet of the patella, so I was able to remove some of the lateral facet.  She had a smaller button placed medial and proximal as it should be.  On final exposure of the knee, I was able to then place retractors, flex the knee up.  I removed the old polyethylene insert.  Preoperatively, I did note hyperextension as well as laxity with regards to her ligaments from extension to flexion. Once I had freed up the soft tissues, I trialed up to a 13 mm insert as opposed to the 9.  With this, the knee felt more stable in extension and to flexion.  The patella tracked through the trochlea without application of pressure.  I did not feel that it was going to be appropriate to get to the next size, which had been 16 mm which would represent a 7 mm increase from her insert.  I think that this could have caused some problems at this point, rather this be a gradual process if not.  Given this decision-making process and exam findings intraoperatively, I selected the size 13 CS insert to match the Triathlon femur size 4. Following further debridement, to make certain the locking mechanism, we would have excellent contact.  I irrigated the knee with normal saline solution and the final insert was impacted into place.  At this point, following irrigation of the knee with pulse lavage, the extensor mechanism was reapproximated using #1 Vicryl and 0 V-Loc suture with the knee in flexion.  The remainder wound was closed by Dr. Guinevere [name] using 2-0 Vicryl and running 4-0 Monocryl.  This wound was cleaned, dried, and dressed sterilely with Dermabond and Aquacel dressing.  As for her left lower extremity, the 2nd time-out was performed identifying the patient, the planned procedure, and extremity.  The leg was exsanguinated to 250 mmHg.  I initially made a  longitudinal based incision lateral to this soft tissue.  I initially did some initial dissection, there was some venous bleeding in the proximity to it that was cauterized.  As I tried to ellipse this thing out, I recognized the proximity to the skin, I thus then made an elliptical incision around it and in doing so, identified that this had old blood clot in it, most consistent with a thrombosed superficial vein.  The tourniquet was let down after just 5 minutes, hemostasis was obtained.  I did not see anything otherwise worrisome and no active bleeding from this excision.  At this point, this wound was irrigated and reapproximated using 2-0 Vicryl and then a running 4-0 Monocryl.  This wound was closed as well  with Dermabond and Aquacel dressing.  She was then brought to the recovery room with both dressed in stable condition, tolerating the procedure well.  Findings will be reviewed with the family.     Pietro Cassis Alvan Dame, M.D.     MDO/MEDQ  D:  12/16/2013  T:  12/17/2013  Job:  [id number]

## 2013-12-17 NOTE — Evaluation (Signed)
Physical Therapy Evaluation Patient Details Name: [name] [name] MRN: [id number] DOB: Apr 13, 1955 Today's Date: 12/17/2013   History of Present Illness  Pt is a 58 year old female s/p Right total knee revision poly exchange and excision of L thigh mass.  Clinical Impression  Pt is s/p R TKA revision resulting in the deficits listed below (see PT Problem List).  Pt will benefit from skilled PT to increase their independence and safety with mobility to allow discharge to the venue listed below.  Pt mobilizing well POD #1 and plans to d/c possibly tomorrow home with spouse.  Pt reports trip planned next month to [facility].     Follow Up Recommendations Home health PT    Equipment Recommendations  None recommended by PT    Recommendations for Other Services       Precautions / Restrictions Precautions Precautions: Knee Restrictions Other Position/Activity Restrictions: WBAT      Mobility  Bed Mobility Overal bed mobility: Needs Assistance Bed Mobility: Supine to Sit     Supine to sit: Supervision;HOB elevated        Transfers Overall transfer level: Needs assistance Equipment used: Rolling walker (2 wheeled) Transfers: Sit to/from Stand Sit to Stand: Min guard         General transfer comment: verbal cues for UE and LE positioning  Ambulation/Gait Ambulation/Gait assistance: Min guard Ambulation Distance (Feet): 120 Feet Assistive device: Rolling walker (2 wheeled) Gait Pattern/deviations: Step-to pattern;Step-through pattern;Antalgic;Decreased stance time - right     General Gait Details: verbal cues for technique, sequence, RW positioning  Stairs            Wheelchair Mobility    Modified Rankin (Stroke Patients Only)       Balance                                             Pertinent Vitals/Pain Pain Assessment: 0-10 Pain Score: 3  Pain Location: R knee Pain Descriptors / Indicators: Sore Pain Intervention(s): Limited  activity within patient's tolerance;Monitored during session;Premedicated before session;Repositioned;Ice applied    Home Living Family/patient expects to be discharged to:: Private residence Living Arrangements: Spouse/significant other Available Help at Discharge: Family Type of Home: House Home Access: Stairs to enter Entrance Stairs-Rails: None Entrance Stairs-Number of Steps: "half a step" Home Layout: One level Home Equipment: Walker - 2 wheels      Prior Function Level of Independence: Independent               Hand Dominance        Extremity/Trunk Assessment               Lower Extremity Assessment: RLE deficits/detail RLE Deficits / Details: able to perform SLR, AAROM knee flexion approx 80*        Communication   Communication: No difficulties  Cognition Arousal/Alertness: Awake/alert Behavior During Therapy: WFL for tasks assessed/performed Overall Cognitive Status: Within Functional Limits for tasks assessed                      General Comments      Exercises Total Joint Exercises Ankle Circles/Pumps: AROM;Both;15 reps Quad Sets: AROM;Both;15 reps Short Arc Quad: AROM;Right;15 reps Heel Slides: AAROM;Right;15 reps Hip ABduction/ADduction: AROM;Right;15 reps Straight Leg Raises: AAROM;Right;10 reps      Assessment/Plan    PT Assessment Patient needs continued PT services  PT  Diagnosis Difficulty walking   PT Problem List Decreased strength;Decreased range of motion;Decreased mobility;Pain  PT Treatment Interventions Functional mobility training;Gait training;DME instruction;Stair training;Patient/family education;Therapeutic activities;Therapeutic exercise   PT Goals (Current goals can be found in the Care Plan section) Acute Rehab PT Goals PT Goal Formulation: With patient Time For Goal Achievement: 12/20/13 Potential to Achieve Goals: Good    Frequency 7X/week   Barriers to discharge        Co-evaluation                End of Session   Activity Tolerance: Patient tolerated treatment well Patient left: in chair;with call bell/phone within reach           Time: 0946-1011 PT Time Calculation (min): 25 min   Charges:   PT Evaluation $Initial PT Evaluation Tier I: 1 Procedure PT Treatments $Gait Training: 8-22 mins $Therapeutic Exercise: 8-22 mins   PT G Codes:          [name] [name],[name] [name] 12/17/2013, 10:50 AM Carmelia Bake, PT, DPT 12/17/2013 Pager: [phone]

## 2013-12-17 NOTE — Care Management Note (Signed)
Page 1 of 2   12/17/2013     10:24:51 AM CARE MANAGEMENT NOTE 12/17/2013  Patient:  [name],[name]   Account Number:  [id number]  Date Initiated:  12/17/2013  Documentation initiated by:  JEFFRIES,SARAH  Subjective/Objective Assessment:   adm: 1.  RIGHT TOTAL KNEE REVISION POLY EXCHANGE (Right), Stryker  2.  EXCISION LEFT  DISTAL THIGH MASS (Left)     Action/Plan:   discharge planning   Anticipated DC Date:  12/17/2013   Anticipated DC Plan:  HOME W HOME HEALTH SERVICES      DC Planning Services  CM consult      PAC Choice  HOME HEALTH   Choice offered to / List presented to:  C-1 Patient   DME arranged  NA      DME agency  NA     HH arranged  HH-2 PT      HH agency  [facility]   Status of service:  Completed, signed off Medicare Important Message given?   (If response is "NO", the following Medicare IM given date fields will be blank) Date Medicare IM given:   Medicare IM given by:   Date Additional Medicare IM given:   Additional Medicare IM given by:    Discharge Disposition:  HOME W HOME HEALTH SERVICES  Per UR Regulation:    If discussed at Long Length of Stay Meetings, dates discussed:    Comments:  12/17/13 10:00 CM met with pt in room to offer choice of home health agency.  Pt chooses [facility] to render HHPT.  No DME needed as pt has both a rolling walker and 3n1 at home. Address and contact information verified with pt and add on of secondary contact, husband [name] [name] [phone].  Referral called to [facility] reo, Kristen.  No other CM needs were communicated.  Sarah Jeffries, BSN, CM [phone].   

## 2013-12-17 NOTE — Progress Notes (Signed)
pts bp in 160's,  Not restarted on lotensin. Will notify MD.

## 2013-12-17 NOTE — Progress Notes (Signed)
Physical Therapy Treatment Note   12/17/13 1700  PT Visit Information  Last PT Received On 12/17/13  Assistance Needed +1  History of Present Illness Pt is a 58 year old female s/p Right total knee revision poly exchange and excision of L thigh mass.  PT Time Calculation  PT Start Time 1441  PT Stop Time 1456  PT Time Calculation (min) 15 min  Subjective Data  Subjective Pt ambulated in hallway again and then assisted back to bed.  Precautions  Precautions Knee  Restrictions  Other Position/Activity Restrictions WBAT  Pain Assessment  Pain Assessment 0-10  Pain Score 3  Pain Location R knee  Pain Descriptors / Indicators Aching;Sore  Pain Intervention(s) Limited activity within patient's tolerance;Repositioned;Ice applied  Cognition  Arousal/Alertness Awake/alert  Behavior During Therapy WFL for tasks assessed/performed  Overall Cognitive Status Within Functional Limits for tasks assessed  Bed Mobility  Overal bed mobility Needs Assistance  Bed Mobility Sit to Supine  Sit to supine Supervision  Transfers  Overall transfer level Needs assistance  Equipment used Rolling walker (2 wheeled)  Transfers Sit to/from Stand  Sit to Stand Supervision  General transfer comment verbal cues for UE and LE positioning  Ambulation/Gait  Ambulation/Gait assistance Min guard;Supervision  Ambulation Distance (Feet) 160 Feet  Assistive device Rolling walker (2 wheeled)  Gait Pattern/deviations Antalgic;Step-through pattern;Decreased stance time - right  General Gait Details verbal cues for technique, sequence, RW positioning  PT - End of Session  Activity Tolerance Patient tolerated treatment well  Patient left in bed;with call bell/phone within reach  PT - Assessment/Plan  PT Plan Current plan remains appropriate  PT Frequency 7X/week  Follow Up Recommendations Home health PT  PT equipment None recommended by PT  PT Goal Progression  Progress towards PT goals Progressing toward goals   PT General Charges  $$ ACUTE PT VISIT 1 Procedure  PT Treatments  $Gait Training 8-22 mins   Carmelia Bake, PT, DPT 12/17/2013 Pager: [phone]

## 2013-12-17 NOTE — Progress Notes (Signed)
Occupational Therapy Evaluation Patient Details Name: [name] [name] MRN: [id number] DOB: [birth year] Today's Date: 12/17/2013    History of Present Illness Pt is a 58 year old female s/p Right total knee revision poly exchange and excision of L thigh mass.   Clinical Impression   Completed all education regarding compensatory techniques for ADL and functional mobility for ADL to facilitate safe D/C home. Pt ready to D/C home when medically stable. OT signing off.     Follow Up Recommendations  No OT follow up;Supervision - Intermittent    Equipment Recommendations  None recommended by OT    Recommendations for Other Services       Precautions / Restrictions Precautions Precautions: Knee Restrictions Weight Bearing Restrictions: No Other Position/Activity Restrictions: WBAT      Mobility Bed Mobility    General bed mobility comments: Pt OOB in chair  Transfers Overall transfer level: Needs assistance Equipment used: Rolling walker (2 wheeled) Transfers: Sit to/from Stand Sit to Stand: Supervision         General transfer comment: verbal cues for UE and LE positioning    Balance Overall balance assessment: No apparent balance deficits (not formally assessed)                                          ADL Overall ADL's : Needs assistance/impaired         Upper Body Bathing: Set up;Sitting   Lower Body Bathing: Minimal assistance;Sit to/from stand   Upper Body Dressing : Set up;Sitting   Lower Body Dressing: Minimal assistance;Sit to/from stand   Toilet Transfer: Supervision/safety;Ambulation;BSC   Toileting- Clothing Manipulation and Hygiene: Modified independent;Sit to/from Nurse, children's Details (indicate cue type and reason): Educated pt on shower transfer technique Functional mobility during ADLs: Rolling walker;Supervision/safety General ADL Comments: Completed education regarding ADL and funcitonal mobility with  necessary DME. Pt demonstrated understanding.     Vision                     Perception     Praxis      Pertinent Vitals/Pain Pain Assessment: 0-10 Pain Score: 3  Pain Location: R knee Pain Descriptors / Indicators: Aching Pain Intervention(s): Limited activity within patient's tolerance;Repositioned     Hand Dominance     Extremity/Trunk Assessment Upper Extremity Assessment Upper Extremity Assessment: Overall WFL for tasks assessed   Lower Extremity Assessment Lower Extremity Assessment: Defer to PT evaluation     Cervical / Trunk Assessment Cervical / Trunk Assessment: Normal   Communication Communication Communication: No difficulties   Cognition Arousal/Alertness: Awake/alert Behavior During Therapy: WFL for tasks assessed/performed Overall Cognitive Status: Within Functional Limits for tasks assessed                     General Comments       Exercises       Shoulder Instructions      Home Living Family/patient expects to be discharged to:: Private residence Living Arrangements: Spouse/significant other Available Help at Discharge: Family Type of Home: House Home Access: Stairs to enter [employer] of Steps: "half a step" Entrance Stairs-Rails: None Home Layout: One level     Bathroom Shower/Tub: Occupational psychologist: Standard Bathroom Accessibility: Yes How Accessible: Accessible via walker Home Equipment: [place] - 2 wheels;Bedside commode  Prior Functioning/Environment Level of Independence: Independent             OT Diagnosis: Acute pain;Generalized weakness   OT Problem List: Pain   OT Treatment/Interventions:      OT Goals(Current goals can be found in the care plan section) Acute Rehab OT Goals Patient Stated Goal: to go home  OT Frequency:     Barriers to D/C:            Co-evaluation              End of Session Equipment Utilized During Treatment: Gait  belt;Rolling walker Nurse Communication: Mobility status  Activity Tolerance: Patient tolerated treatment well Patient left: in chair;with call bell/phone within reach   Time: 1035-1100 OT Time Calculation (min): 25 min Charges:  OT General Charges $OT Visit: 1 Procedure OT Evaluation $Initial OT Evaluation Tier I: 1 Procedure OT Treatments $Self Care/Home Management : 8-22 mins G-Codes:    [name] [name],HILLARY 01/09/2014, 11:54 AM   Maurie Boettcher, OTR/L  [phone] 01-09-14

## 2013-12-17 NOTE — Progress Notes (Signed)
Utilization review completed.  

## 2013-12-17 NOTE — Progress Notes (Signed)
Pts blood glucose via Lab draw was 355.  Notified PA.  Administered 15units novolog and will cont to assess.

## 2013-12-18 LAB — BASIC METABOLIC PANEL
Anion gap: 12 (ref 5–15)
BUN: 23 mg/dL (ref 6–23)
CO2: 27 mEq/L (ref 19–32)
Calcium: 9.4 mg/dL (ref 8.4–10.5)
Chloride: 97 mEq/L (ref 96–112)
Creatinine, Ser: 0.79 mg/dL (ref 0.50–1.10)
GFR calc Af Amer: >90 mL/min (ref >90)
GFR calc non Af Amer: 89 mL/min — ABNORMAL LOW (ref >90)
Glucose, Bld: 155 mg/dL — ABNORMAL HIGH (ref 70–99)
Potassium: 4.5 mEq/L (ref 3.7–5.3)
Sodium: 136 mEq/L — ABNORMAL LOW (ref 137–147)

## 2013-12-18 LAB — CBC
HCT: 37.2 % (ref 36.0–46.0)
Hemoglobin: 13.1 g/dL (ref 12.0–15.0)
MCH: 33.3 pg (ref 26.0–34.0)
MCHC: 35.2 g/dL (ref 30.0–36.0)
MCV: 94.7 fL (ref 78.0–100.0)
Platelets: 149 10*3/uL — ABNORMAL LOW (ref 150–400)
RBC: 3.93 MIL/uL (ref 3.87–5.11)
RDW: 13.4 % (ref 11.5–15.5)
WBC: 9 10*3/uL (ref 4.0–10.5)

## 2013-12-18 LAB — GLUCOSE, CAPILLARY: Glucose-Capillary: 171 mg/dL — ABNORMAL HIGH (ref 70–99)

## 2013-12-18 NOTE — Progress Notes (Signed)
[facility] is providing the following services: patient declined rw and commode - has both at home.   If patient discharges after hours, please call [phone].   Linward Headland 12/18/2013, 9:19 AM

## 2013-12-18 NOTE — Progress Notes (Signed)
     Subjective: 2 Days Post-Op Procedure(s) (LRB): RIGHT TOTAL KNEE REVISION POLY EXCHANGE (Right) EXCISION LEFT  DISTAL THIGH MASS (Left)   Patient reports pain as mild, pain controlled. Little issue with hyperglycemia last night, this has been an issue she has been trying to resolve for about 3 month with PCP. Otherwise, no events throughout the night. Ready to be discharged home.  Objective:   VITALS:   Filed Vitals:   12/18/13 0450  BP: 147/62  Pulse: 76  Temp: 98.8 F (37.1 C)  Resp: 19    Dorsiflexion/Plantar flexion intact Incision: dressing C/D/I No cellulitis present Compartment soft  LABS  Recent Labs  12/17/13 0445 12/18/13 0511  HGB 13.4 13.1  HCT 38.3 37.2  WBC 7.4 9.0  PLT 147* 149*     Recent Labs  12/17/13 0445 12/17/13 1743 12/18/13 0511  NA 133*  --  136*  K 5.1  --  4.5  BUN 18  --  23  CREATININE 0.85  --  0.79  GLUCOSE 245* 355* 155*     Assessment/Plan: 2 Days Post-Op Procedure(s) (LRB): RIGHT TOTAL KNEE REVISION POLY EXCHANGE (Right) EXCISION LEFT  DISTAL THIGH MASS (Left) Up with therapy Discharge home with home health Follow up in 2 weeks at [facility]. Follow up with OLIN,Shamari Trostel D in 2 weeks.  Contact information:  [facility] [address], [place] [phone]    Obese (BMI 30-39.9) Estimated body mass index is 39.15 kg/(m^2) as calculated from the following:   Height as of this encounter: 5\' 7"  (1.702 m).   Weight as of this encounter: 113.399 kg (250 lb). Patient also counseled that weight may inhibit the healing process Patient counseled that losing weight will help with future health issues        [name]. Izic Stfort   PAC  12/18/2013, 9:49 AM

## 2013-12-18 NOTE — Progress Notes (Signed)
Physical Therapy Treatment Patient Details Name: [name] [name] MRN: [id number] DOB: 12-Nov-1955 Today's Date: January 11, 2014    History of Present Illness Pt is a 58 year old female s/p Right total knee revision poly exchange and excision of L thigh mass.    PT Comments    Pt ambulated in hallway and performed LE exercises.  Pt provided with HEP handout.  Pt's questions answered and pt feels ready for d/c home today.  Follow Up Recommendations  Home health PT     Equipment Recommendations  None recommended by PT    Recommendations for Other Services       Precautions / Restrictions Precautions Precautions: Knee Restrictions Other Position/Activity Restrictions: WBAT    Mobility  Bed Mobility Overal bed mobility: Modified Independent                Transfers Overall transfer level: Needs assistance Equipment used: Rolling walker (2 wheeled) Transfers: Sit to/from Stand Sit to Stand: Supervision         General transfer comment: verbal cues for UE and LE positioning  Ambulation/Gait Ambulation/Gait assistance: Supervision Ambulation Distance (Feet): 400 Feet Assistive device: Rolling walker (2 wheeled) Gait Pattern/deviations: Step-through pattern;Step-to pattern     General Gait Details: progressed to step through pattern, verbal cue for heel strike   Stairs            Wheelchair Mobility    Modified Rankin (Stroke Patients Only)       Balance                                    Cognition Arousal/Alertness: Awake/alert Behavior During Therapy: WFL for tasks assessed/performed Overall Cognitive Status: Within Functional Limits for tasks assessed                      Exercises Total Joint Exercises Ankle Circles/Pumps: AROM;Both;15 reps Quad Sets: AROM;Both;15 reps Towel Squeeze: AROM;Both;15 reps Short Arc Quad: AROM;Right;15 reps Heel Slides: AAROM;Right;15 reps;Supine;Seated Hip ABduction/ADduction:  AROM;Right;15 reps Straight Leg Raises: Right;10 reps;AROM    General Comments        Pertinent Vitals/Pain Pain Assessment: 0-10 Pain Score: 3  Pain Location: R knee Pain Descriptors / Indicators: Aching;Sore Pain Intervention(s): Limited activity within patient's tolerance;Monitored during session;Premedicated before session;Repositioned;Ice applied    Home Living                      Prior Function            PT Goals (current goals can now be found in the care plan section) Progress towards PT goals: Progressing toward goals    Frequency  7X/week    PT Plan Current plan remains appropriate    Co-evaluation             End of Session   Activity Tolerance: Patient tolerated treatment well Patient left: in bed;with call bell/phone within reach     Time: 0903-0932 PT Time Calculation (min): 29 min  Charges:  $Gait Training: 8-22 mins $Therapeutic Exercise: 8-22 mins                    G Codes:      Nakshatra Klose,KATHrine E Jan 11, 2014, 11:40 AM Carmelia Bake, PT, DPT Jan 11, 2014 Pager: [phone]

## 2013-12-23 NOTE — Discharge Summary (Signed)
Physician Discharge Summary  Patient ID: [name] [name] MRN: [id number] DOB/AGE: 1955/12/26 58 y.o.  Admit date: 12/16/2013 Discharge date: 12/18/2013   Procedures:  Procedure(s) (LRB): RIGHT TOTAL KNEE REVISION POLY EXCHANGE (Right) EXCISION LEFT  DISTAL THIGH MASS (Left)  Attending Physician:  Dr. Paralee Cancel   Admission Diagnoses:   Right knee pain S/P TKA and mass of the distal left thig  Discharge Diagnoses:  Principal Problem:   S/P right TK revision Active Problems:   S/P revision of total knee  Past Medical History  Diagnosis Date  . Depression   . GERD (gastroesophageal reflux disease)   . Hypertension   . Hyperlipidemia   . Colon polyps   . Thyroid disease   . History of recurrent UTIs   . Anemia     menstrual related  . Arthritis     right ankle  . Diabetes mellitus 12/2009    type 2  . Alcohol problem drinking     rehab  . Acute meniscal tear of knee     Left knee  . Heart murmur   . Shortness of breath     with exertion   . Hypothyroidism   . Anxiety     HPI: [name] [name], 58 y.o. 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 The indications for the revision of the total knee arthroplasty are bearing surface wear leading to symptomatic synovitis. Onset of symptoms was gradual starting 1+ years ago with gradually worsening course since that time. Prior procedures on the right knee(s) include arthroplasty. Patient currently rates pain in the right knee(s) at 7 out of 10 with activity. There is worsening of pain with activity and weight bearing, pain that interferes with activities of daily living, pain with passive range of motion, crepitus and joint swelling.  Patient has evidence of previous TKA by imaging studies. This condition presents safety issues increasing the risk of falls. There is no current active infection. Also presents with a mass of the distal left thigh. This has been evaluated by her PCP and a dermatologist, all were unable to tell her what it is. It has been present and bothering her for 3+ months. It continues to bother her and she is concerned with the color of the area. She wishes to have the area removed. Risks, benefits and expectations were discussed with the patient. Risks including but not limited to the risk of anesthesia, blood clots, nerve damage, blood vessel damage, failure of the prosthesis, infection and up to and including death. Patient understand the risks, benefits and expectations and wishes to proceed with surgery.  PCP: Zenaida Niece, PA-C   Discharged Condition: good  Hospital Course:  Patient underwent the above stated procedure on 12/16/2013. Patient tolerated the procedure well and brought to the recovery room in good condition and subsequently to the floor.  POD #1 BP: 147/56 ; Pulse: 76 ; Temp: 98.3 F (36.8 C) ; Resp: 16 Patient reports pain as mild to moderate, no major complaints. Did not sleep last night due to interruptions more so than due to pain. Dorsiflexion/plantar flexion intact, incision: dressing C/D/I, no cellulitis present and compartment soft.   LABS  Basename    HGB  13.4  HCT  38.3   POD #2  BP: 147/62 ; Pulse: 76 ; Temp: 98.8 F (37.1 C) ; Resp: 19 Patient reports pain as mild, pain controlled. Little issue with hyperglycemia last night, this has been an issue she has been trying to resolve for about 3 month with PCP. Otherwise, no events throughout the night. Ready to be discharged home. Dorsiflexion/plantar flexion intact, incision: dressing C/D/I, no cellulitis present and compartment soft.   LABS  Basename    HGB  13.1  HCT  37.2    Discharge Exam: General  appearance: alert, cooperative and no distress Extremities: Homans sign is negative, no sign of DVT, no edema, redness or tenderness in the calves or thighs and no ulcers, gangrene or trophic changes  Disposition: Home with follow up in 2 weeks   Follow-up Information    Follow up with Mauri Pole, MD. Schedule an appointment as soon as possible for a visit in 2 weeks.   Specialty:  Orthopedic Surgery   Contact information:   [address] [place] [address] [phone]       Follow up with [facility].   Why:  home health physical therapy   Contact information:   [address] [place] [address] [phone]       Discharge Instructions    Call MD / Call 911    Complete by:  As directed   If you experience chest pain or shortness of breath, CALL 911 and be transported to the hospital emergency room.  If you develope a fever above 101 F, pus (white drainage) or increased drainage or redness at the wound, or calf pain, call your surgeon's office.     Change dressing    Complete by:  As directed   Maintain surgical dressing for 10-14 days, or until follow up in the clinic.     Constipation Prevention    Complete by:  As directed   Drink plenty of fluids.  Prune juice may be helpful.  You may use a stool softener, such as Colace (over the counter) 100 mg twice a day.  Use MiraLax (over the counter) for constipation as needed.     Diet - low sodium heart healthy    Complete by:  As directed      Discharge instructions    Complete by:  As directed   Maintain surgical dressing for 10-14 days, or until follow up in the clinic. Follow up in 2 weeks at [facility]. Call with any questions or concerns.     Driving restrictions    Complete by:  As directed   No driving for 4 weeks     Increase activity slowly as tolerated    Complete by:  As directed      TED hose    Complete by:  As directed   Use stockings (TED hose)  for 2 weeks on both leg(s).  You may remove them at night for sleeping.     Weight bearing as tolerated    Complete by:  As directed   Laterality:  bilateral  Extremity:  Lower             Medication List    STOP taking these medications        meloxicam 15 MG tablet  Commonly known as:  MOBIC      TAKE these medications        aspirin 325 MG EC tablet  Take 1 tablet (325 mg total) by mouth 2 (two) times daily.  B COMPLEX 100 PO  Take 1 tablet by mouth 2 (two) times daily.     benazepril-hydrochlorthiazide 10-12.5 MG per tablet  Commonly known as:  LOTENSIN HCT  Take 1 tablet by mouth every morning.     cetirizine 10 MG tablet  Commonly known as:  ZYRTEC  Take 10 mg by mouth every evening.     diazepam 2 MG tablet  Commonly known as:  VALIUM  Take 1 tablet (2 mg total) by mouth daily as needed for anxiety.     DSS 100 MG Caps  Take 100 mg by mouth 2 (two) times daily.     escitalopram 20 MG tablet  Commonly known as:  LEXAPRO  Take 20 mg by mouth every morning.     ezetimibe 10 MG tablet  Commonly known as:  ZETIA  Take 10 mg by mouth every morning.     ferrous sulfate 325 (65 FE) MG tablet  Take 1 tablet (325 mg total) by mouth 3 (three) times daily after meals.     glimepiride 2 MG tablet  Commonly known as:  AMARYL  Take 1 tablet (2 mg total) by mouth daily with breakfast.     HYDROcodone-acetaminophen 7.5-325 MG per tablet  Commonly known as:  NORCO  Take 1-2 tablets by mouth every 4 (four) hours as needed for moderate pain.     levothyroxine 137 MCG tablet  Commonly known as:  SYNTHROID, LEVOTHROID  Take 137 mcg by mouth daily before breakfast.     methocarbamol 500 MG tablet  Commonly known as:  ROBAXIN  Take 1 tablet (500 mg total) by mouth every 6 (six) hours as needed for muscle spasms.     mupirocin ointment 2 %  Commonly known as:  BACTROBAN  Place 1 application into the nose 2 (two) times daily as needed.     omeprazole 20 MG  capsule  Commonly known as:  PRILOSEC  Take 20 mg by mouth 2 (two) times daily.     polyethylene glycol packet  Commonly known as:  MIRALAX / GLYCOLAX  Take 17 g by mouth 2 (two) times daily.     simvastatin 40 MG tablet  Commonly known as:  ZOCOR  Take 40 mg by mouth every evening.     sitaGLIPtin-metformin 50-1000 MG per tablet  Commonly known as:  JANUMET  Take 2 tablets by mouth every evening.     thiamine 50 MG tablet  Commonly known as:  VITAMIN B-1  Take 50 mg by mouth every morning.     Vitamin D3 2000 UNITS Tabs  Take 1 tablet by mouth every morning.     zaleplon 10 MG capsule  Commonly known as:  SONATA  Take 1 capsule (10 mg total) by mouth at bedtime as needed for sleep.         Signed: [name]. Faylene Allerton   PA-C  12/23/2013, 9:47 AM

## 2014-01-02 ENCOUNTER — Ambulatory Visit: Payer: Self-pay | Admitting: Cardiovascular Disease

## 2014-01-28 ENCOUNTER — Ambulatory Visit: Payer: Self-pay | Admitting: Physician Assistant

## 2014-02-17 ENCOUNTER — Other Ambulatory Visit (INDEPENDENT_AMBULATORY_CARE_PROVIDER_SITE_OTHER): Payer: No Typology Code available for payment source

## 2014-02-17 DIAGNOSIS — E119 Type 2 diabetes mellitus without complications: Secondary | ICD-10-CM

## 2014-02-17 LAB — HEMOGLOBIN A1C: Hgb A1c MFr Bld: 5.5 % (ref 4.6–6.5)

## 2014-02-26 ENCOUNTER — Encounter: Payer: Self-pay | Admitting: Family Medicine

## 2014-02-26 ENCOUNTER — Ambulatory Visit (INDEPENDENT_AMBULATORY_CARE_PROVIDER_SITE_OTHER): Payer: No Typology Code available for payment source | Admitting: Family Medicine

## 2014-02-26 VITALS — BP 150/78 | Temp 98.4°F | Wt 250.0 lb

## 2014-02-26 DIAGNOSIS — E119 Type 2 diabetes mellitus without complications: Secondary | ICD-10-CM

## 2014-02-26 DIAGNOSIS — F1010 Alcohol abuse, uncomplicated: Secondary | ICD-10-CM

## 2014-02-26 DIAGNOSIS — E785 Hyperlipidemia, unspecified: Secondary | ICD-10-CM

## 2014-02-26 DIAGNOSIS — I10 Essential (primary) hypertension: Secondary | ICD-10-CM

## 2014-02-26 DIAGNOSIS — F32A Depression, unspecified: Secondary | ICD-10-CM

## 2014-02-26 DIAGNOSIS — E039 Hypothyroidism, unspecified: Secondary | ICD-10-CM

## 2014-02-26 DIAGNOSIS — F329 Major depressive disorder, single episode, unspecified: Secondary | ICD-10-CM

## 2014-02-26 LAB — CBC
HCT: 46.6 % — ABNORMAL HIGH (ref 36.0–46.0)
Hemoglobin: 15.7 g/dL — ABNORMAL HIGH (ref 12.0–15.0)
MCHC: 33.7 g/dL (ref 30.0–36.0)
MCV: 103.6 fl — ABNORMAL HIGH (ref 78.0–100.0)
Platelets: 204 10*3/uL (ref 150.0–400.0)
RBC: 4.5 Mil/uL (ref 3.87–5.11)
RDW: 15.7 % — ABNORMAL HIGH (ref 11.5–15.5)
WBC: 6.9 10*3/uL (ref 4.0–10.5)

## 2014-02-26 LAB — COMPREHENSIVE METABOLIC PANEL
ALT: 104 U/L — ABNORMAL HIGH (ref 0–35)
AST: 151 U/L — ABNORMAL HIGH (ref 0–37)
Albumin: 3.9 g/dL (ref 3.5–5.2)
Alkaline Phosphatase: 127 U/L — ABNORMAL HIGH (ref 39–117)
BUN: 10 mg/dL (ref 6–23)
CO2: 29 mEq/L (ref 19–32)
Calcium: 10.4 mg/dL (ref 8.4–10.5)
Chloride: 97 mEq/L (ref 96–112)
Creatinine, Ser: 0.8 mg/dL (ref 0.4–1.2)
GFR: 82.64 mL/min (ref >60.00)
Glucose, Bld: 124 mg/dL — ABNORMAL HIGH (ref 70–99)
Potassium: 5.2 mEq/L — ABNORMAL HIGH (ref 3.5–5.1)
Sodium: 135 mEq/L (ref 135–145)
Total Bilirubin: 4.1 mg/dL — ABNORMAL HIGH (ref 0.2–1.2)
Total Protein: 7.6 g/dL (ref 6.0–8.3)

## 2014-02-26 LAB — LDL CHOLESTEROL, DIRECT: Direct LDL: 158.6 mg/dL

## 2014-02-26 LAB — TSH: TSH: 20.26 u[IU]/mL — ABNORMAL HIGH (ref 0.35–4.50)

## 2014-02-26 MED ORDER — BENAZEPRIL-HYDROCHLOROTHIAZIDE 10-12.5 MG PO TABS: 1.0000 | ORAL_TABLET | ORAL | Status: DC

## 2014-02-26 NOTE — Assessment & Plan Note (Signed)
Poor control. Increase benazepril-hctz 10-12.5mg  to BID dosing.

## 2014-02-26 NOTE — Patient Instructions (Addendum)
Check labs today.   Pneumonia shot final one a year from august.   Blood pressure up a little for last several visits. Let's increase your benazepril-hctz to twice a day. I circled this.   Recommend 4-6 week blood pressure recheck.   I am happy to help with alcohol problems if needed. I would advise you to go back to your psychiatrist.   Schedule for my next available establish visit so we can go over the rest of your history.

## 2014-02-26 NOTE — Assessment & Plan Note (Addendum)
Controlled/continue levothyroxine 137 mcg. Check TSH

## 2014-02-26 NOTE — Assessment & Plan Note (Signed)
Strongly advised cessation. Advised AA, rehab, or psychiatry for assistance, she is planning on going back to psychiatry potentially. Asked her if I could help and she defers for now. With abuse history, will check LFTs today.

## 2014-02-26 NOTE — Assessment & Plan Note (Signed)
Discussed would advise titration off SSRI through MD/DO assist next time she decids this. Fortunately phq2 0 and appears controlled though could be masked through alcohol use. Gave strict precautions for return in relation to depression.

## 2014-02-26 NOTE — Assessment & Plan Note (Signed)
Controlled previously. Repeat direct ldl today. Continue zetia and simvastatin.

## 2014-02-26 NOTE — Assessment & Plan Note (Signed)
Controlled based off a1c last week with a1c <6. Continue current medications including sitagliptin-metformin 50-1000mg  BID, amaryl  2 mg daily

## 2014-02-26 NOTE — Progress Notes (Signed)
[name], MD Phone: [phone]  Subjective:   [name] [name] is a 59 y.o. year old very pleasant female patient who presents with the following:  Alcohol abuse- poor control Depression-controlled Binge drinks 2 6 packs every 2-3 days. Gets symptoms including shakiness, nausea if does not. Has bene through rehab before but not interested in 12 step program. Quit with psychiatry involvement in the past. No history of liver damage Patient self stopped valium as well as lexapro several months ago. Her only current problem is trouble getting to sleep.  ROS- No RUQ pain. No SI/HI.   Hypothyroidism-controlled previously On thyroid medication-levothyroxine 138mcg ROS-No hair or nail changes. No heat/cold intolerance. No constipation or diarrhea.   DIABETES Type II-controlled on sitagliptin-metformin and amaryl 2mg .   Lab Results  Component Value Date   HGBA1C 5.5 02/17/2014   HGBA1C 7.8* 10/17/2013   HGBA1C 8.3* 08/15/2013  Blood Sugars per patient-fasting- 87-150  ROS- Denies Polyuria,Polydipsia, nocturia, Vision changes, feet or hand numbness/pain/tingling. Denies  Hypoglycemia symptoms (shaky, sweaty, hungry, weak anxious, tremor, palpitations, confusion, behavior change).   Hyperlipidemia-controlled  Lab Results  Component Value Date   LDLCALC 41 08/15/2013  On statin: zetia and simvastatin 40mg  ROS- no chest pain or shortness of breath. No myalgias  Hypertension-poor control on benazepril-hctz 10-12.5mg  once daily  BP Readings from Last 3 Encounters:  02/26/14 150/78  12/18/13 147/62  12/11/13 151/68  Home BP monitoring-no Compliant with medications-yes without side effects ROS-Denies any CP, HA, SOB, blurry vision  Past Medical History- Patient Active Problem List   Diagnosis Date Noted  . Type II diabetes mellitus, well controlled 06/23/2010    Priority: High  . ETOH abuse 06/23/2010    Priority: High  . Hypothyroid 07/21/2010    Priority: Medium  . HTN  (hypertension) 06/23/2010    Priority: Medium  . Hyperlipidemia 06/23/2010    Priority: Medium  . Depression 06/23/2010    Priority: Medium  . Fatigue 08/15/2013    Priority: Low  . 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 Thyroid nodule 09/17/2012  . Obesity (BMI 30-39.9) 06/23/2010  . PERSONAL HISTORY OF COLONIC POLYPS 04/12/2010   Medications- reviewed and updated Current Outpatient Prescriptions  Medication Sig Dispense Refill  . B Complex Vitamins (B COMPLEX 100 PO) Take 1 tablet by mouth 2 (two) times daily.    . benazepril-hydrochlorthiazide (LOTENSIN HCT) 10-12.5 MG per tablet Take 1 tablet by mouth every morning.    . cetirizine (ZYRTEC) 10 MG tablet Take 10 mg by mouth every evening.    . Cholecalciferol (VITAMIN D3) 2000 UNITS TABS Take 1 tablet by mouth every morning.     . docusate sodium 100 MG CAPS Take 100 mg by mouth 2 (two) times daily. 10 capsule 0  . escitalopram (LEXAPRO) 20 MG tablet Take 20 mg by mouth every morning.    . ezetimibe (ZETIA) 10 MG tablet Take 10 mg by mouth every morning.    . ferrous sulfate 325 (65 FE) MG tablet Take 1 tablet (325 mg total) by mouth 3 (three) times daily after meals.  3  . glimepiride (AMARYL) 2 MG tablet Take 1 tablet (2 mg total) by mouth daily with breakfast. 90 tablet 3  . levothyroxine (SYNTHROID, LEVOTHROID) 137 MCG tablet Take 137 mcg by mouth daily before breakfast.    . omeprazole (PRILOSEC) 20 MG capsule Take 20 mg by mouth 2 (two) times daily.    . simvastatin (ZOCOR) 40 MG tablet  Take 40 mg by mouth every evening.    . sitaGLIPtin-metformin (JANUMET) 50-1000 MG per tablet Take 2 tablets by mouth every evening.    . thiamine (VITAMIN B-1) 50 MG tablet Take 50 mg by mouth every morning.    . mupirocin ointment (BACTROBAN) 2 % Place 1 application into the nose 2 (two) times daily as needed.      Objective: BP 150/78 mmHg  Temp(Src) 98.4 F (36.9 C)  Wt 250 lb (113.399 kg) Gen:  NAD, resting comfortably CV: RRR no murmurs rubs or gallops Lungs: CTAB no crackles, wheeze, rhonchi Abdomen: soft/nontender/nondistended. Obese.  Ext: trace  edema Skin: warm, dry, no rash  Neuro: grossly normal, moves all extremities   Assessment/Plan:  ETOH abuse Strongly advised cessation. Advised AA, rehab, or psychiatry for assistance, she is planning on going back to psychiatry potentially. Asked her if I could help and she defers for now. With abuse history, will check LFTs today.   Depression Discussed would advise titration off SSRI through MD/DO assist next time she decids this. Fortunately phq2 0 and appears controlled though could be masked through alcohol use. Gave strict precautions for return in relation to depression.   Hypothyroid Controlled/continue levothyroxine 137 mcg. Check TSH  Type II diabetes mellitus, well controlled Controlled based off a1c last week with a1c <6. Continue current medications including sitagliptin-metformin 50-1000mg  BID, amaryl  2 mg daily  Hyperlipidemia Controlled previously. Repeat direct ldl today. Continue zetia and simvastatin.   HTN (hypertension) Poor control. Increase benazepril-hctz 10-12.5mg  to BID dosing.    Return precautions advised. Plan 4-6 week BP recheck with establish visit to review full history at my next available.   nonfasting Orders Placed This Encounter  Procedures  . CBC    [place]  . Comprehensive metabolic panel    [place]  . TSH    [place]  . LDL cholesterol, direct    [place]    Meds ordered this encounter  . benazepril-hydrochlorthiazide (LOTENSIN HCT) 10-12.5 MG per tablet    Sig: Take 1 tablet by mouth every morning.    Dispense:  180 tablet    Refill:  3

## 2014-02-27 ENCOUNTER — Encounter: Payer: Self-pay | Admitting: Family Medicine

## 2014-03-05 ENCOUNTER — Encounter: Payer: Self-pay | Admitting: Family Medicine

## 2014-03-05 DIAGNOSIS — R7401 Elevation of levels of liver transaminase levels: Secondary | ICD-10-CM

## 2014-03-05 DIAGNOSIS — R17 Unspecified jaundice: Secondary | ICD-10-CM

## 2014-03-05 DIAGNOSIS — R740 Nonspecific elevation of levels of transaminase and lactic acid dehydrogenase [LDH]: Secondary | ICD-10-CM

## 2014-03-05 DIAGNOSIS — D539 Nutritional anemia, unspecified: Secondary | ICD-10-CM

## 2014-03-05 MED ORDER — LEVOTHYROXINE SODIUM 150 MCG PO TABS: 150.0000 ug | ORAL_TABLET | Freq: Every day | ORAL | Status: DC

## 2014-03-05 NOTE — Telephone Encounter (Signed)
Labs ordered to be obtained before follow up.

## 2014-03-05 NOTE — Addendum Note (Signed)
Addended by: Marin Olp on: 03/05/2014 06:32 PM   Modules accepted: Orders

## 2014-03-19 ENCOUNTER — Encounter: Payer: Self-pay | Admitting: Internal Medicine

## 2014-04-11 ENCOUNTER — Other Ambulatory Visit (INDEPENDENT_AMBULATORY_CARE_PROVIDER_SITE_OTHER): Payer: No Typology Code available for payment source

## 2014-04-11 DIAGNOSIS — R7401 Elevation of levels of liver transaminase levels: Secondary | ICD-10-CM

## 2014-04-11 DIAGNOSIS — D539 Nutritional anemia, unspecified: Secondary | ICD-10-CM | POA: Diagnosis not present

## 2014-04-11 DIAGNOSIS — R740 Nonspecific elevation of levels of transaminase and lactic acid dehydrogenase [LDH]: Secondary | ICD-10-CM

## 2014-04-11 DIAGNOSIS — R17 Unspecified jaundice: Secondary | ICD-10-CM

## 2014-04-11 LAB — CBC WITH DIFFERENTIAL/PLATELET
Basophils Absolute: 0 10*3/uL (ref 0.0–0.1)
Basophils Relative: 0.6 % (ref 0.0–3.0)
Eosinophils Absolute: 0.1 10*3/uL (ref 0.0–0.7)
Eosinophils Relative: 1.3 % (ref 0.0–5.0)
HCT: 43.6 % (ref 36.0–46.0)
Hemoglobin: 15.2 g/dL — ABNORMAL HIGH (ref 12.0–15.0)
Lymphocytes Relative: 25.4 % (ref 12.0–46.0)
Lymphs Abs: 1.4 10*3/uL (ref 0.7–4.0)
MCHC: 34.9 g/dL (ref 30.0–36.0)
MCV: 102.7 fl — ABNORMAL HIGH (ref 78.0–100.0)
Monocytes Absolute: 0.6 10*3/uL (ref 0.1–1.0)
Monocytes Relative: 10.1 % (ref 3.0–12.0)
Neutro Abs: 3.6 10*3/uL (ref 1.4–7.7)
Neutrophils Relative %: 62.6 % (ref 43.0–77.0)
Platelets: 141 10*3/uL — ABNORMAL LOW (ref 150.0–400.0)
RBC: 4.25 Mil/uL (ref 3.87–5.11)
RDW: 14.7 % (ref 11.5–15.5)
WBC: 5.7 10*3/uL (ref 4.0–10.5)

## 2014-04-11 LAB — COMPREHENSIVE METABOLIC PANEL
ALT: 39 U/L — ABNORMAL HIGH (ref 0–35)
AST: 79 U/L — ABNORMAL HIGH (ref 0–37)
Albumin: 3.8 g/dL (ref 3.5–5.2)
Alkaline Phosphatase: 81 U/L (ref 39–117)
BUN: 10 mg/dL (ref 6–23)
CO2: 25 mEq/L (ref 19–32)
Calcium: 10.4 mg/dL (ref 8.4–10.5)
Chloride: 100 mEq/L (ref 96–112)
Creatinine, Ser: 0.73 mg/dL (ref 0.40–1.20)
GFR: 86.53 mL/min (ref >60.00)
Glucose, Bld: 112 mg/dL — ABNORMAL HIGH (ref 70–99)
Potassium: 4.1 mEq/L (ref 3.5–5.1)
Sodium: 134 mEq/L — ABNORMAL LOW (ref 135–145)
Total Bilirubin: 2.7 mg/dL — ABNORMAL HIGH (ref 0.2–1.2)
Total Protein: 7.2 g/dL (ref 6.0–8.3)

## 2014-04-11 LAB — FOLATE: Folate: >24.5 ng/mL (ref >5.9)

## 2014-04-11 LAB — GAMMA GT: GGT: 176 U/L — ABNORMAL HIGH (ref 7–51)

## 2014-04-11 LAB — VITAMIN B12: Vitamin B-12: 665 pg/mL (ref 211–911)

## 2014-04-14 LAB — IRON AND TIBC
%SAT: 56 % — ABNORMAL HIGH (ref 20–55)
Iron: 156 ug/dL — ABNORMAL HIGH (ref 42–145)
TIBC: 277 ug/dL (ref 250–470)
UIBC: 121 ug/dL — ABNORMAL LOW (ref 125–400)

## 2014-04-14 LAB — HEPATITIS B CORE ANTIBODY, TOTAL: Hep B Core Total Ab: NONREACTIVE

## 2014-04-14 LAB — HEPATITIS B SURFACE ANTIGEN: Hepatitis B Surface Ag: NEGATIVE

## 2014-04-14 LAB — HEPATITIS B SURFACE ANTIBODY,QUALITATIVE: Hep B S Ab: POSITIVE — AB

## 2014-04-14 LAB — HEPATITIS C ANTIBODY: HCV Ab: NEGATIVE

## 2014-04-16 ENCOUNTER — Encounter: Payer: Self-pay | Admitting: Family Medicine

## 2014-04-18 ENCOUNTER — Encounter: Payer: Self-pay | Admitting: Family Medicine

## 2014-04-18 ENCOUNTER — Ambulatory Visit (INDEPENDENT_AMBULATORY_CARE_PROVIDER_SITE_OTHER): Payer: No Typology Code available for payment source | Admitting: Family Medicine

## 2014-04-18 VITALS — BP 122/54 | Temp 98.8°F | Wt 257.0 lb

## 2014-04-18 DIAGNOSIS — R945 Abnormal results of liver function studies: Secondary | ICD-10-CM

## 2014-04-18 DIAGNOSIS — R17 Unspecified jaundice: Secondary | ICD-10-CM

## 2014-04-18 DIAGNOSIS — F329 Major depressive disorder, single episode, unspecified: Secondary | ICD-10-CM

## 2014-04-18 DIAGNOSIS — R7989 Other specified abnormal findings of blood chemistry: Secondary | ICD-10-CM

## 2014-04-18 DIAGNOSIS — F32A Depression, unspecified: Secondary | ICD-10-CM

## 2014-04-18 DIAGNOSIS — D7589 Other specified diseases of blood and blood-forming organs: Secondary | ICD-10-CM

## 2014-04-18 DIAGNOSIS — F1010 Alcohol abuse, uncomplicated: Secondary | ICD-10-CM

## 2014-04-18 NOTE — Patient Instructions (Signed)
Check liver Olevia Perches will call you  Come back for labs in 1 month. See me within a few days.   I am concerned your alcohol abuse is interwoven with your depression. I think you need help for both. [facility] or [facility] would be great options. [facility] another option. Check with your insurance but i want you to get in as soon as possible  Please call me within 10 days so we can know how you are doing and if you have an appointment set up.   If you want to come to talk with me about starting depression medication before you see psych we can certainly do that but I think you need a comprehensive plan and counseling for both issues.

## 2014-04-18 NOTE — Progress Notes (Signed)
[name], MD Phone: [phone]  Subjective:   [name] [name] is a 59 y.o. year old very pleasant female patient who presents with the following:  Patient presents to follow up on elevated LFTs, alcohol abuse. She has had some recent stressors including Mother in law has some carotid artery blockages, Daughter cancelled wedding 2 months before date. She states that she actually was drinking a 6 pack a day and is Now down to 2-3 per day . [facility]. Does not like AA because feels like religious tint. She also has a macrocytosis not explained by b12 or folate but could be related to alcoholism. Stopped omeprazole OTC because read in news could hurt the liver (essentially in denial that liver issues caused by alcohol).   I also enquired about depression and patient admits to anhedonia and depressed mood for at least a year. Her PHQ9 score was 18.   ROS- no ruq pain, no extremity weakness, no SI/HI.   Past Medical History- Patient Active Problem List   Diagnosis Date Noted  . Type II diabetes mellitus, well controlled 06/23/2010    Priority: High  . ETOH abuse 06/23/2010    Priority: High  . Depression 06/23/2010    Priority: High  . Hypothyroid 07/21/2010    Priority: Medium  . HTN (hypertension) 06/23/2010    Priority: Medium  . Hyperlipidemia 06/23/2010    Priority: Medium  . Macrocytosis 04/19/2014    Priority: Low  . Fatigue 08/15/2013    Priority: Low  . 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 Thyroid nodule 09/17/2012  . Obesity (BMI 30-39.9) 06/23/2010  . PERSONAL HISTORY OF COLONIC POLYPS 04/12/2010   Medications- reviewed and updated Current Outpatient Prescriptions  Medication Sig Dispense Refill  . B Complex Vitamins (B COMPLEX 100 PO) Take 1 tablet by mouth 2 (two) times daily.    . benazepril-hydrochlorthiazide (LOTENSIN HCT) 10-12.5 MG per tablet Take 1 tablet by mouth every  morning. 180 tablet 3  . cetirizine (ZYRTEC) 10 MG tablet Take 10 mg by mouth every evening.    . Cholecalciferol (VITAMIN D3) 2000 UNITS TABS Take 1 tablet by mouth every morning.     . ezetimibe (ZETIA) 10 MG tablet Take 10 mg by mouth every morning.    . ferrous sulfate 325 (65 FE) MG tablet Take 1 tablet (325 mg total) by mouth 3 (three) times daily after meals.  3  . glimepiride (AMARYL) 2 MG tablet Take 1 tablet (2 mg total) by mouth daily with breakfast. 90 tablet 3  . levothyroxine (SYNTHROID, LEVOTHROID) 150 MCG tablet Take 1 tablet (150 mcg total) by mouth daily before breakfast. 30 tablet 5  . simvastatin (ZOCOR) 40 MG tablet Take 40 mg by mouth every evening.    . sitaGLIPtin-metformin (JANUMET) 50-1000 MG per tablet Take 2 tablets by mouth every evening.    . thiamine (VITAMIN B-1) 50 MG tablet Take 50 mg by mouth every morning.    . mupirocin ointment (BACTROBAN) 2 % Place 1 application into the nose 2 (two) times daily as needed.     No current facility-administered medications for this visit.    Objective: BP 122/54 mmHg  Temp(Src) 98.8 F (37.1 C)  Wt 257 lb (116.574 kg) Gen: NAD, resting comfortably, obese CV: RRR no murmurs rubs or gallops Lungs: CTAB no crackles, wheeze, rhonchi Abdomen: soft/nontender/nondistended/normal bowel sounds. No RUQ pain specifically. Obese and difficult to assess  hepatomegaly.  Ext: no edema Skin: warm, dry, no rash   Assessment/Plan:  Elevated LFTs and bili Trending down with some decrease in alcohol use but needs total cessation. We will get a liver ultrasound. Ultrasound in 2012 showed fatty liver which is likely an element as well. Patient has no interest in exercise as depression needs to be treated first. Patient will also return for lab sin 3 weeks then see me within a few days.   Macrocytosis b12 and folate normal. Likely explained by alcoholism.    Depression Alcohol abuse as well Encouraged cessation. [facility] or [facility]. Gave handout for [facility], [facility], and [facility]. Patient needs help with depression as well as alcohol abuse. Considered SSRI but patient needs comprehensive plan including therapy/counseling. Asked her to update me last week about who she is going to see for her alcoholism and depression. She has a history of depression and previously admitted to me that she stopped medication on her own. I think patient was underreporting symptoms at last visit with phq2 of 0 at that time.    7 day report of pscych plan. 3 weeks labs, see after labs.   Orders Placed This Encounter  Procedures  . US Abdomen Limited RUQ    Standing Status: Future     Number of Occurrences:      Standing Expiration Date: 06/17/2015    Order Specific Question:  Reason for Exam (SYMPTOM  OR DIAGNOSIS REQUIRED)    Answer:  elevated bili, transaminases. Alcohol abuse. prior fatty liver.    Order Specific Question:  Preferred imaging location?    Answer:  [place]  . CBC with Differential/Platelet    Standing Status: Future     Number of Occurrences:      Standing Expiration Date: 04/19/2015  . Hepatic Function Panel    Standing Status: Future     Number of Occurrences:      Standing Expiration Date: 04/19/2015  . Bilirubin, fractionated(tot/dir/indir)    Standing Status: Future     Number of Occurrences:      Standing Expiration Date: 04/19/2015

## 2014-04-19 ENCOUNTER — Encounter: Payer: Self-pay | Admitting: Family Medicine

## 2014-04-19 DIAGNOSIS — D7589 Other specified diseases of blood and blood-forming organs: Secondary | ICD-10-CM | POA: Insufficient documentation

## 2014-04-19 NOTE — Assessment & Plan Note (Signed)
Alcohol abuse as well Encouraged cessation. [facility] or [facility]. Gave handout for [facility] treatment center, [facility], and [facility]. Patient needs help with depression as well as alcohol abuse. Considered SSRI but patient needs comprehensive plan including therapy/counseling. Asked her to update me last week about who she is going to see for her alcoholism and depression. She has a history of depression and previously admitted to me that she stopped medication on her own. I think patient was underreporting symptoms at last visit with phq2 of 0 at that time.

## 2014-04-19 NOTE — Assessment & Plan Note (Signed)
b12 and folate normal. Likely explained by alcoholism.

## 2014-04-23 ENCOUNTER — Encounter: Payer: Self-pay | Admitting: Family Medicine

## 2014-04-25 ENCOUNTER — Ambulatory Visit (INDEPENDENT_AMBULATORY_CARE_PROVIDER_SITE_OTHER): Payer: No Typology Code available for payment source | Admitting: Family Medicine

## 2014-04-25 ENCOUNTER — Encounter: Payer: Self-pay | Admitting: Family Medicine

## 2014-04-25 ENCOUNTER — Telehealth: Payer: Self-pay

## 2014-04-25 VITALS — BP 130/60 | HR 95 | Temp 100.5°F | Wt 254.0 lb

## 2014-04-25 DIAGNOSIS — R509 Fever, unspecified: Secondary | ICD-10-CM

## 2014-04-25 DIAGNOSIS — R6889 Other general symptoms and signs: Secondary | ICD-10-CM

## 2014-04-25 DIAGNOSIS — F1010 Alcohol abuse, uncomplicated: Secondary | ICD-10-CM

## 2014-04-25 LAB — POCT INFLUENZA A/B
Influenza A, POC: NEGATIVE
Influenza B, POC: NEGATIVE

## 2014-04-25 LAB — POCT RAPID STREP A (OFFICE): Rapid Strep A Screen: NEGATIVE

## 2014-04-25 MED ORDER — LORAZEPAM 0.5 MG PO TABS: 0.5000 mg | ORAL_TABLET | Freq: Three times a day (TID) | ORAL | Status: DC

## 2014-04-25 NOTE — Patient Instructions (Signed)
Alcohol detox-start ativan every 8 hours, stop alcohol immediately. Follow up with behavioral health next week  Upper Respiratory Infection The most important thing is to get a lot of rest and stay well hydrated.  If you have continued fevers into Monday (YOU NEED TO BE SEEN Monday AM-please let front desk know of this request), worsening of symptoms beyond the time we discussed (7-10 days), worsening of symptoms after they get better, please schedule a follow up visit or give [place] a call for advise.   For fever or pain-use  Ibuprofen. Tylenol max 2000mg  per day.  For congestion-try a neti pot and make sure to follow instructions for water For cough-sore throat lozenges (try to get low sugar)

## 2014-04-25 NOTE — Telephone Encounter (Signed)
Called and spoke with pt regarding results from Abdominal US letting her know that it showed fatty liver but no cirrhosis and Dr. Yong Channel will review it further with her in detail at her upcoming office visit.

## 2014-04-25 NOTE — Assessment & Plan Note (Signed)
Plugged into [facility]. Patient was advised to start ativan to help with avoiding alcohol. We will use q8 hours until she follows up with them for detox/removal from benzos next week. No alcohol with this. Patient with no SI.

## 2014-04-25 NOTE — Progress Notes (Signed)
[name], MD Phone: [phone]  Subjective:   [name] [name] is a 59 y.o. year old very pleasant female patient who presents with the following:  Cough/congestion/fever -started Wednesday with cough, then got a sore throat, runny nose. Cough worsened as well as sore throat. Has continued to worsen. Temp 101.6 last night and this morning. Has been given tylenol.  ROS- occasional chill. No nausea/vomiting  Alcoholism- poor control but seeking help Saw counselor yesterday, will be set up with psychiatrist to help with detox and would be in for 72 hours. [facility]. 8 week program overall. Patient states the asked that she be set up with ativan until the 72 hour planned detox.  ROS- no RUQ pain, denies tremors   Past Medical History- Patient Active Problem List   Diagnosis Date Noted  . Type II diabetes mellitus, well controlled 06/23/2010    Priority: High  . ETOH abuse 06/23/2010    Priority: High  . Depression 06/23/2010    Priority: High  . Hypothyroid 07/21/2010    Priority: Medium  . HTN (hypertension) 06/23/2010    Priority: Medium  . Hyperlipidemia 06/23/2010    Priority: Medium  . Macrocytosis 04/19/2014    Priority: Low  . Fatigue 08/15/2013    Priority: Low  . Obesity (BMI 30-39.9) 06/23/2010    Priority: Low  . Personal history of colonic polyps 04/12/2010    Priority: Low  . 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 Thyroid nodule 09/17/2012   Medications- reviewed and updated Current Outpatient Prescriptions  Medication Sig Dispense Refill  . B Complex Vitamins (B COMPLEX 100 PO) Take 1 tablet by mouth 2 (two) times daily.    . benazepril-hydrochlorthiazide (LOTENSIN HCT) 10-12.5 MG per tablet Take 1 tablet by mouth every morning. 180 tablet 3  . cetirizine (ZYRTEC) 10 MG tablet Take 10 mg by mouth every evening.    . Cholecalciferol (VITAMIN D3) 2000 UNITS TABS Take 1 tablet by mouth every morning.       . ezetimibe (ZETIA) 10 MG tablet Take 10 mg by mouth every morning.    . ferrous sulfate 325 (65 FE) MG tablet Take 1 tablet (325 mg total) by mouth 3 (three) times daily after meals.  3  . glimepiride (AMARYL) 2 MG tablet Take 1 tablet (2 mg total) by mouth daily with breakfast. 90 tablet 3  . levothyroxine (SYNTHROID, LEVOTHROID) 150 MCG tablet Take 1 tablet (150 mcg total) by mouth daily before breakfast. 30 tablet 5  . simvastatin (ZOCOR) 40 MG tablet Take 40 mg by mouth every evening.    . sitaGLIPtin-metformin (JANUMET) 50-1000 MG per tablet Take 2 tablets by mouth every evening.    . thiamine (VITAMIN B-1) 50 MG tablet Take 50 mg by mouth every morning.    . mupirocin ointment (BACTROBAN) 2 % Place 1 application into the nose 2 (two) times daily as needed.     Objective: BP 130/60 mmHg  Pulse 95  Temp(Src) 100.5 F (38.1 C)  Wt 254 lb (115.214 kg)  SpO2 97% Gen: NAD, resting comfortably HEENT: nares erythematous and swollen, oropharynx erythematous without pharyngeal exudate, TM normal bilaterally- some scarring (old on L), Mucous membranes are mildly dry CV: RRR no rubs or gallops. 2/6 radiating to carotid SEM  Lungs: CTAB no crackles, wheeze, rhonchi Abdomen: soft/nontender/nondistended/normal bowel sounds. No rebound or guarding.  Skin: warm, dry, no rash   Assessment/Plan:  Upper Respiratory Infection (nonspecific viral illness) Advised  of symptomatic care (see AVS).  Due to sore throat and fever swabbed for strep throat and negative (Centor score was 0 and did not pursue follow up testing). Flu was tested and negative. Did receive flu shot Given reasons for return -specifically if fever continues into Monday I want to see patient  ETOH abuse Plugged into [facility]. Patient was advised to start ativan to help with avoiding alcohol. We will use q8 hours until she follows up with them for detox/removal from benzos next week. No alcohol with this. Patient with no  SI.   Results for orders placed or performed in visit on 04/25/14 (from the past 24 hour(s))  POC Influenza A/B     Status: None   Collection Time: 04/25/14  1:41 PM  Result Value Ref Range   Influenza A, POC Negative    Influenza B, POC Negative   POCT rapid strep A     Status: None   Collection Time: 04/25/14  1:50 PM  Result Value Ref Range   Rapid Strep A Screen Negative Negative   Meds ordered this encounter  Medications  . LORazepam (ATIVAN) 0.5 MG tablet    Sig: Take 1 tablet (0.5 mg total) by mouth every 8 (eight) hours. Stop alcohol immediately and do not drink while taking this medication    Dispense:  21 tablet    Refill:  0   >50% of 25 minute office visit was spent on counseling (withdrawal, need to abstain from alcohol, follow up indications) and coordination of care

## 2014-04-28 ENCOUNTER — Ambulatory Visit (INDEPENDENT_AMBULATORY_CARE_PROVIDER_SITE_OTHER): Payer: No Typology Code available for payment source | Admitting: Family Medicine

## 2014-04-28 ENCOUNTER — Encounter: Payer: Self-pay | Admitting: Family Medicine

## 2014-04-28 VITALS — BP 122/64 | HR 72 | Temp 98.6°F | Wt 250.0 lb

## 2014-04-28 DIAGNOSIS — B9689 Other specified bacterial agents as the cause of diseases classified elsewhere: Secondary | ICD-10-CM

## 2014-04-28 DIAGNOSIS — J329 Chronic sinusitis, unspecified: Secondary | ICD-10-CM

## 2014-04-28 DIAGNOSIS — H6692 Otitis media, unspecified, left ear: Secondary | ICD-10-CM

## 2014-04-28 DIAGNOSIS — A499 Bacterial infection, unspecified: Secondary | ICD-10-CM

## 2014-04-28 MED ORDER — AMOXICILLIN-POT CLAVULANATE 875-125 MG PO TABS: 1.0000 | ORAL_TABLET | Freq: Two times a day (BID) | ORAL | Status: DC

## 2014-04-28 NOTE — Progress Notes (Signed)
[name], MD Phone: [phone]  Subjective:   [name] [name] is a 59 y.o. year old very pleasant female patient who presents with the following:  Fever/sinus pressure/cough -over the weekend temperatureranged temperature 99-102. Now day 5 or 6 of illness. Cough congestion some sore throat. No new shortness of breath. Has noted some occasional wheezing. Sinus pressur has increased and having yellow/green drainage down throat and from nose. Denies ear pain.   Remains alcohol free. Denies tremors on ativan.   ROS- fatigue improving. Urine seems cloudy. No pain with urination. No polyuria. Slight Right flank pain with palpatoin.   Past Medical History- Type II DM, alcohol abuse, depression, hypothyroid, HTN, HLD  Medications- reviewed and updated Current Outpatient Prescriptions  Medication Sig Dispense Refill  . B Complex Vitamins (B COMPLEX 100 PO) Take 1 tablet by mouth 2 (two) times daily.    . benazepril-hydrochlorthiazide (LOTENSIN HCT) 10-12.5 MG per tablet Take 1 tablet by mouth every morning. 180 tablet 3  . cetirizine (ZYRTEC) 10 MG tablet Take 10 mg by mouth every evening.    . Cholecalciferol (VITAMIN D3) 2000 UNITS TABS Take 1 tablet by mouth every morning.     . ezetimibe (ZETIA) 10 MG tablet Take 10 mg by mouth every morning.    . ferrous sulfate 325 (65 FE) MG tablet Take 1 tablet (325 mg total) by mouth 3 (three) times daily after meals.  3  . glimepiride (AMARYL) 2 MG tablet Take 1 tablet (2 mg total) by mouth daily with breakfast. 90 tablet 3  . levothyroxine (SYNTHROID, LEVOTHROID) 150 MCG tablet Take 1 tablet (150 mcg total) by mouth daily before breakfast. 30 tablet 5  . LORazepam (ATIVAN) 0.5 MG tablet Take 1 tablet (0.5 mg total) by mouth every 8 (eight) hours. Stop alcohol immediately and do not drink while taking this medication 21 tablet 0  . simvastatin (ZOCOR) 40 MG tablet Take 40 mg by mouth every evening.    . sitaGLIPtin-metformin (JANUMET) 50-1000  MG per tablet Take 2 tablets by mouth every evening.    . thiamine (VITAMIN B-1) 50 MG tablet Take 50 mg by mouth every morning.    . mupirocin ointment (BACTROBAN) 2 % Place 1 application into the nose 2 (two) times daily as needed.     No current facility-administered medications for this visit.    Objective: BP 122/64 mmHg  Pulse 72  Temp(Src) 98.6 F (37 C)  Wt 250 lb (113.399 kg)  SpO2 95% Gen: NAD, resting comfortably HEENT: nares erythematous and swollen with yellow drainage, oropharynx erythematous without pharyngeal exudate, TM normal on right. Left ear which previously noted to have some scarring now is opacified and bulging with erythema overlying. , Mucous membranes are mildly dry CV: RRR no rubs or gallops. 2/6 radiating to carotid SEM  Lungs: CTAB no crackles, wheeze, rhonchi Abdomen: soft/nontender/nondistended/normal bowel sounds. No rebound or guarding.  No suprapubic pain.  Skin: warm, dry, no rash    Assessment/Plan:  L otitis media Developed since last visit, given concurrent symptoms of sinus pressure and green/yellow drainage, opted to treat with augmentin to cover bacterial sinusitis in addition to acute otitis media. Warning signs given for return. Patient also having some wheezing-hopeful no development of bronchitis though lung exam was clear today.   Return precautions advised.   Meds ordered this encounter  Medications  . amoxicillin-clavulanate (AUGMENTIN) 875-125 MG per tablet    Sig: Take 1 tablet by mouth 2 (two) times daily.    Dispense:  16 tablet    Refill:  0

## 2014-04-28 NOTE — Patient Instructions (Signed)
Your Left ear looks far worse than last week and concerning for infection. Your continued fevers and sinus pressure also concern me for bacterial sinusitis.   Treat with 8 days of augmentin. Follow up if shortness of breath, persistent fevers by Thursday or if no resolution of symptoms but I think you will be feeling better within about 48 hours.

## 2014-04-30 ENCOUNTER — Other Ambulatory Visit (HOSPITAL_COMMUNITY): Payer: No Typology Code available for payment source

## 2014-04-30 ENCOUNTER — Other Ambulatory Visit (HOSPITAL_COMMUNITY): Payer: No Typology Code available for payment source | Admitting: Psychology

## 2014-04-30 DIAGNOSIS — F10239 Alcohol dependence with withdrawal, unspecified: Secondary | ICD-10-CM | POA: Diagnosis present

## 2014-04-30 DIAGNOSIS — G8929 Other chronic pain: Secondary | ICD-10-CM | POA: Diagnosis not present

## 2014-04-30 DIAGNOSIS — F4310 Post-traumatic stress disorder, unspecified: Secondary | ICD-10-CM | POA: Diagnosis not present

## 2014-04-30 DIAGNOSIS — I10 Essential (primary) hypertension: Secondary | ICD-10-CM | POA: Diagnosis not present

## 2014-04-30 DIAGNOSIS — F329 Major depressive disorder, single episode, unspecified: Secondary | ICD-10-CM | POA: Diagnosis not present

## 2014-05-01 ENCOUNTER — Encounter (HOSPITAL_COMMUNITY): Payer: Self-pay | Admitting: Psychology

## 2014-05-01 DIAGNOSIS — F1020 Alcohol dependence, uncomplicated: Secondary | ICD-10-CM | POA: Insufficient documentation

## 2014-05-01 NOTE — Progress Notes (Signed)
    Daily Group Progress Note  Program: CD-IOP   Group Time: 1-2:30 pm  Participation Level: Minimal  Behavioral Response: Nervous, did not want to share  Type of Therapy: Process Group  Topic: After checking in, the group went outside to complete a walking meditation.  After returning inside, group members shared and how the experience was for them.  Group members then took turns sharing about any recovery-related activities they had engaged in since the last group meeting, as well as any challenges they faced.  One group member reported that he had relapsed.  Drug test results were returned to some group members, and drug tests were collected from others.  Group Time: 2:45- 4pm  Participation Level: Active  Behavioral Response: Appropriate and Sharing  Type of Therapy: Psycho-education Group  Topic: During the second portion of the group, counselors checked in with group members about the topic of core beliefs, and gave them some time to share what they had been thinking about and reflecting on over the past couple of days.  Group members then completed a mask activity.  On the front of the mask, group members were prompted to write words that express how they present themselves to the world and what others may see when looking at them.  On the inside of the mask, group members wrote words to show how they truly feel about themselves.  After the activity, they took turns sharing about their masks and discussed the differences between the inside and outside of their mask.  Summary: Today was the patient's first group session. She was attentive, but shared nothing of herself. When asked to tell the group a little bit about herself, she admitted she did not want to talk. She reminded me that she didn't do well in groups.  She did say, with a counselor's prompting, that she had not had a drink since Friday, but had experienced some cravings over the weekend.  During the mask activity, the  patient wrote words such as "energetic", "confident", and "caring" on the outside of her mask, while on the inside she wrote "sad", "tired", "weak", "ugliness", and "empty" on the inside.  She became emotional when sharing her mask with the group and cried. The group provided gentle feedback and members were very supportive and welcoming towards her.  The patient stated she would return for the Friday session. Her sobriety date is 3/5.     Family Program: Family present? No   Name of family member(s):   UDS collected: Yes Results: pending  AA/NA attended?: No, but she is new to the program  Sponsor?: No   Shaniqwa Horsman, LCAS

## 2014-05-02 ENCOUNTER — Encounter (HOSPITAL_COMMUNITY): Payer: Self-pay

## 2014-05-02 ENCOUNTER — Other Ambulatory Visit (HOSPITAL_COMMUNITY): Payer: No Typology Code available for payment source | Admitting: Psychology

## 2014-05-02 VITALS — BP 162/80 | HR 69 | Resp 20 | Ht 67.0 in | Wt 249.0 lb

## 2014-05-02 DIAGNOSIS — F10239 Alcohol dependence with withdrawal, unspecified: Secondary | ICD-10-CM

## 2014-05-02 DIAGNOSIS — Z62810 Personal history of physical and sexual abuse in childhood: Secondary | ICD-10-CM | POA: Insufficient documentation

## 2014-05-02 DIAGNOSIS — G479 Sleep disorder, unspecified: Secondary | ICD-10-CM | POA: Insufficient documentation

## 2014-05-02 DIAGNOSIS — Z811 Family history of alcohol abuse and dependence: Secondary | ICD-10-CM

## 2014-05-02 DIAGNOSIS — K7010 Alcoholic hepatitis without ascites: Secondary | ICD-10-CM | POA: Insufficient documentation

## 2014-05-02 DIAGNOSIS — F4312 Post-traumatic stress disorder, chronic: Secondary | ICD-10-CM | POA: Insufficient documentation

## 2014-05-02 DIAGNOSIS — K700 Alcoholic fatty liver: Secondary | ICD-10-CM | POA: Insufficient documentation

## 2014-05-02 DIAGNOSIS — F32A Depression, unspecified: Secondary | ICD-10-CM | POA: Insufficient documentation

## 2014-05-02 DIAGNOSIS — F329 Major depressive disorder, single episode, unspecified: Secondary | ICD-10-CM

## 2014-05-02 MED ORDER — DIAZEPAM 5 MG PO TABS: ORAL_TABLET | ORAL | Status: DC

## 2014-05-02 MED ORDER — ESCITALOPRAM OXALATE 20 MG PO TABS: 20.0000 mg | ORAL_TABLET | Freq: Every day | ORAL | Status: DC

## 2014-05-02 MED ORDER — TRAZODONE HCL 50 MG PO TABS: 50.0000 mg | ORAL_TABLET | Freq: Every evening | ORAL | Status: DC | PRN

## 2014-05-02 NOTE — Progress Notes (Signed)
Currently detoxing from relapse for ETOHism and starting CD IOP No SI. Had taken Zoloft in past but quit taking/stared drinking again
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
## 2014-05-05 ENCOUNTER — Encounter (HOSPITAL_COMMUNITY): Payer: Self-pay | Admitting: Psychology

## 2014-05-05 ENCOUNTER — Other Ambulatory Visit (HOSPITAL_COMMUNITY)
Payer: No Typology Code available for payment source | Attending: Psychiatry | Admitting: Licensed Clinical Social Worker

## 2014-05-05 DIAGNOSIS — F10239 Alcohol dependence with withdrawal, unspecified: Secondary | ICD-10-CM | POA: Diagnosis not present

## 2014-05-05 NOTE — Progress Notes (Signed)
    Daily Group Progress Note  Program: CD-IOP   Group Time: 1-2:30 pm  Participation Level: Active  Behavioral Response: Appropriate and Sharing  Type of Therapy: Process Group  Topic: Process: the first part of group was spent in process. Members shared about current issues and concerns in early recovery. During this session, the medical director met with 2 new patients and another who will be leaving soon. There was good feedback and discussion among group members.  Group Time: 2:45-4 pm  Participation Level: Active  Behavioral Response: Sharing  Type of Therapy: Psycho-education Group  Topic: Masks, Part 2: the second half of group was spent in a psycho-ed. It was a continuation of the previous group session and included the Masks members had made on Wednesday. Today, members were asked to write on their fellow group members' mask. They were asked to write comments or words to describe how they perceived the member. Upon completing this exercise, members read the comments out loud. Most were touched by the kind words, but one member became frustrated because she didn't agree with the words describing her. Members were encouraged to attend meetings and reach out to others in recovery over the weekend.   Summary: The patient reported she had attended an [place] meeting today. She had been welcomed to the meeting by 2 women in this group and she had sat with them. The patient reported it was nice to know someone there. The other women stated they had looked for her after the meeting because they had gone to lunch. The patient admitted she had left quickly because she was meeting with her daughter. When asked about how things had gone since the last group session, the patient reported she had had a craving for a drink on Wednesday evening. Instead of drinking, she reported she had a Diet Pepsi and a piece of chocolate. The craving passed. The patient was called out of the session and met with  the program director for much of the second half of group. She read some of the words written by others and they included sensitive, sad and shy. The patient made some good comments and responded well to this intervention. Her sobriety date remains 3/5.   Family Program: Family present? No   Name of family member(s):   UDS collected: No Results:   AA/NA attended?: Yes, Friday  Sponsor?: No, but patient is new to the group   [name] [name], LCAS

## 2014-05-06 NOTE — Progress Notes (Signed)
[name] [name] is a 59 y.o. female patient. CD-IOP: Treatment Planning Session. I met with the patient this morning prior to group. She reported she had not drunk over the weekend. She admitted she had slept much of Saturday. The trazadone medication had proven more than effective. When the patient had a craving to drink on Friday night, she reached for a diet Pepsi and a piece of chocolate instead. However, the patient admitted she had not attended any meetings. I reminded her that other group members had invited her to join them at the 10 am AA meeting and she might feel more comfortable if she recognizes someone when she does attend a meeting. The patient agreed she would speak with them and plan a day to meet. I explained the need for identifying goals for treatment and the patient was very agreeable to this. Her #1 goal of treatment is to remain alcohol-free. She also agreed that she could not do it alone. 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 When asked about any other treatment goal she might have, the patient identified wanting to reduce her symptoms of depression. The program director had met with her on Friday and had started her on Lexapro in addition to the Trazadone. In addition to medication, the patient identified staying sober, engaging in a little exercise and doing things that make her laugh. The documentation was reviewed, signed and the treatment plan completed accordingly. She is doing well and has remained sober since 3/5. The patient has remained sober for 10 days. We will continue to follow closely in the days ahead.         Lorel Lembo, LCAS

## 2014-05-06 NOTE — Progress Notes (Signed)
    Daily Group Progress Note  Program: CD-IOP   Group Time: 1-2:30  Participation Level: Active  Behavioral Response: Appropriate and Sharing  Type of Therapy: Process Group  Topic: After checking in, group members took turns sharing about recovery-related activities and challenges they faced since our last group meeting.   Group members were active during this discussion and many group members engaged in feedback to other group members. Drug tests were given out to all group members. One patient shared with the group that he relapsed last night.  Patients in the group gave him feedback on the importance of calling other group members when there is an urge to use. There was a new member that began the group today who introduced himself and was welcomed by the group.     Group Time: 2:45-4  Participation Level: Active  Behavioral Response: Appropriate and Sharing  Type of Therapy: Psycho-education Group  Topic: The second half of the group was psychoeducation on HIV/STD's presented by a health educator from [facility]. She also gave out condoms, and answered questions from the group members. All of the group members were active participants during the presentation. After the group presentation, the rest of the group finished check in with recovery-related activities and challenges.   Summary: :  The patient reported she did not do anything for her sobriety over the weekend, but one group member reminded her she did text back to the group member on Saturday. The patient tearfully told the group tomorrow is the two-year anniversary of her mother's death. Her family will go out to dinner to celebrate her mother's life. The patient reported she slept a lot over the weekend. She reported she will go to a meeting tomorrow. The patient reported she got a lot out of the presentation in group today. Her sobriety date remains 3/5.   Family Program: Family present? No   Name of  family member(s):   UDS collected: Yes Results: Pending  AA/NA attended?: YesMonday  Sponsor?: No   Jermichael Belmares S, Licensed Cli

## 2014-05-07 ENCOUNTER — Encounter (HOSPITAL_COMMUNITY): Payer: Self-pay | Admitting: Psychology

## 2014-05-07 ENCOUNTER — Other Ambulatory Visit (HOSPITAL_COMMUNITY): Payer: No Typology Code available for payment source | Admitting: Psychology

## 2014-05-07 DIAGNOSIS — F4312 Post-traumatic stress disorder, chronic: Secondary | ICD-10-CM

## 2014-05-07 DIAGNOSIS — F10239 Alcohol dependence with withdrawal, unspecified: Secondary | ICD-10-CM | POA: Diagnosis not present

## 2014-05-07 DIAGNOSIS — F1020 Alcohol dependence, uncomplicated: Secondary | ICD-10-CM

## 2014-05-08 ENCOUNTER — Encounter (HOSPITAL_COMMUNITY): Payer: Self-pay | Admitting: Psychology

## 2014-05-08 NOTE — Progress Notes (Signed)
[name] [name] is a 59 y.o. female patient. CD-IOP: Family Session. I met with the patient and her daughter this morning. The patient just entered the program last week and I felt is was very important to meet with her daughter as soon as possible in order to garner more support for her mother and insure that everyone understands what we are doing here. [name] [name] was very pleasant and shared openly about her concerns. She talked about her fears and what she has seen change about her mother in just the last week or so. [name] [name] reported her mother seemed a bit tense and uncertain. Her mother confirmed that she does feel a little anxious because she really doesn't know who she is without alcohol. [name] [name] also expressed concern about her mother's sleep habits and was quick to point out that her mother is a night owl and she doesn't care about when she sleeps. It is more that she get a good night's sleep. We discussed her sleep patterns and the patient admitted she doesn't usually go to bed until 3-4 am and only sleeps about 4-5 hours. At other times, though, she will sleep all day. When I questioned these patterns, the patient admitted that her depression may be contributing to her sleep issues. I explained to the daughter about the structure and intentions behind the CD-IOP program and she seemed very pleased that we would be addressing the topics I identified. Grief is a big issue for her mother, she explained, but also self-worth and feelings of abandonment that had developed after learning that her birth mother had given her up. I explained how Core Beliefs develop and clearly this patient has a very poor or low sense of self and it has contributed greatly to her isolation and lack of friends and relationships. The session went very well and the patient's daughter expressed her appreciation and gratitude that her mother is here in the program. The patient responded well to this intervention and shared openly about  her fears of rejection and further abandonment. We will continue to follow her closely in the days and weeks ahead. The patient's sobriety date remains 3/5.         Jaren Vanetten, LCAS
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
## 2014-05-09 ENCOUNTER — Other Ambulatory Visit (HOSPITAL_COMMUNITY): Payer: No Typology Code available for payment source | Admitting: Psychology

## 2014-05-09 DIAGNOSIS — F4312 Post-traumatic stress disorder, chronic: Secondary | ICD-10-CM

## 2014-05-09 DIAGNOSIS — F1020 Alcohol dependence, uncomplicated: Secondary | ICD-10-CM

## 2014-05-12 ENCOUNTER — Other Ambulatory Visit: Payer: Self-pay

## 2014-05-12 ENCOUNTER — Other Ambulatory Visit (HOSPITAL_COMMUNITY): Payer: No Typology Code available for payment source | Admitting: Psychology

## 2014-05-12 ENCOUNTER — Encounter (HOSPITAL_COMMUNITY): Payer: Self-pay | Admitting: Psychology

## 2014-05-12 DIAGNOSIS — F1020 Alcohol dependence, uncomplicated: Secondary | ICD-10-CM

## 2014-05-12 DIAGNOSIS — F4312 Post-traumatic stress disorder, chronic: Secondary | ICD-10-CM

## 2014-05-12 NOTE — Telephone Encounter (Signed)
No would need to see the psychiatrist she is seeing or me to refill this and that is not a guarantee I would refill.

## 2014-05-12 NOTE — Progress Notes (Addendum)
    Daily Group Progress Note  Program: CD-IOP   Group Time: 1-2:30 pm  Participation Level: Active  Behavioral Response: Appropriate and Sharing  Type of Therapy: Process Group  Topic: Process; the first part of group was spent in process. Members shared what they had done to strengthen their recovery since we last met. Members mentioned meetings, talking with sponsor, and step work. They also discussed diet, exercises and other examples of good self-care. One member had returned after a week-long relapse and she admitted being scared if she goes back out. During this session, the medical director met with 2 new group members and discharged another.   Group Time: 2:45- 4pm  Participation Level: Active  Behavioral Response: Sharing  Type of Therapy: Psycho-education Group  Topic: Psycho-ed/Graduation: the second half of group today was spent in a psycho-ed.  A DVD called "The Neurobiology of Addiction" was viewed. Members discussed the information disclosed in the film and there was a good and newly obtained understanding of the chemical nature of their addiction. One member said, "Now I understand what is happening when I feel like I do". Before the session was completed, a graduation ceremony was held to honor a successfully graduating member. There were kind words of respect and caring expressed as the medallion was passed around the group. The graduation was an emotional ceremony for the member leaving [place] today.   Summary: The patient reported she had attended 2-AA meetings since the last group. She had met with 3 other women in this program and they had walked and had lunch afterwards. She was feeling good about herself and the fact that she has remained sober. The patient agreed with another that the DVD was very informative and certainly made her own experiences more understandable. The patient was teary as she wished the graduating member well in his journey. She was grateful for  his kindness and valued his insights and disclosures. The patient responded well to this intervention and her sobriety date remains 3/5.  UDS collected: Yes Results:   AA/NA attended?: YesThursday and Friday  Sponsor?: No,  But she is actively seeking one   Trevaughn Schear, LCAS

## 2014-05-12 NOTE — Telephone Encounter (Signed)
Rx request for Valium 2 mg tablet-Take 2 tablets by mouth twice daily as needed for anxiety   Pharm:  [facility]

## 2014-05-12 NOTE — Psych (Signed)
Addendum: This refers to group note from 05/09/14. This is the Subject from the group note entered previously:  The patient reported she had attended 2-AA meetings since the last group. She had met with 3 other women in this program and they had walked and had lunch afterwards. She was feeling good about herself and the fact that she has remained sober. The patient agreed with another that the DVD was very informative and certainly made her own experiences more understandable. The patient was teary as she wished the graduating member well in his journey. She was grateful for his kindness and valued his insights and disclosures. The patient responded well to this intervention and her sobriety date remains 3/5.

## 2014-05-12 NOTE — Telephone Encounter (Signed)
Ok to refill? Didn't see anything in previous notes.

## 2014-05-13 ENCOUNTER — Other Ambulatory Visit: Payer: No Typology Code available for payment source

## 2014-05-13 ENCOUNTER — Encounter (HOSPITAL_COMMUNITY): Payer: Self-pay | Admitting: Psychology

## 2014-05-13 ENCOUNTER — Other Ambulatory Visit (INDEPENDENT_AMBULATORY_CARE_PROVIDER_SITE_OTHER): Payer: No Typology Code available for payment source

## 2014-05-13 DIAGNOSIS — F1010 Alcohol abuse, uncomplicated: Secondary | ICD-10-CM | POA: Diagnosis not present

## 2014-05-13 DIAGNOSIS — R17 Unspecified jaundice: Secondary | ICD-10-CM

## 2014-05-13 DIAGNOSIS — R945 Abnormal results of liver function studies: Secondary | ICD-10-CM

## 2014-05-13 DIAGNOSIS — R7989 Other specified abnormal findings of blood chemistry: Secondary | ICD-10-CM | POA: Diagnosis not present

## 2014-05-13 DIAGNOSIS — D7589 Other specified diseases of blood and blood-forming organs: Secondary | ICD-10-CM

## 2014-05-13 LAB — CBC WITH DIFFERENTIAL/PLATELET
Basophils Absolute: 0 10*3/uL (ref 0.0–0.1)
Basophils Relative: 0.5 % (ref 0.0–3.0)
Eosinophils Absolute: 0.2 10*3/uL (ref 0.0–0.7)
Eosinophils Relative: 3 % (ref 0.0–5.0)
HCT: 37.7 % (ref 36.0–46.0)
Hemoglobin: 13.1 g/dL (ref 12.0–15.0)
Lymphocytes Relative: 52.5 % — ABNORMAL HIGH (ref 12.0–46.0)
Lymphs Abs: 2.9 10*3/uL (ref 0.7–4.0)
MCHC: 34.6 g/dL (ref 30.0–36.0)
MCV: 104.7 fl — ABNORMAL HIGH (ref 78.0–100.0)
Monocytes Absolute: 0.4 10*3/uL (ref 0.1–1.0)
Monocytes Relative: 7.9 % (ref 3.0–12.0)
Neutro Abs: 2 10*3/uL (ref 1.4–7.7)
Neutrophils Relative %: 36.1 % — ABNORMAL LOW (ref 43.0–77.0)
Platelets: 185 10*3/uL (ref 150.0–400.0)
RBC: 3.6 Mil/uL — ABNORMAL LOW (ref 3.87–5.11)
RDW: 14.1 % (ref 11.5–15.5)
WBC: 5.5 10*3/uL (ref 4.0–10.5)

## 2014-05-13 LAB — HEPATIC FUNCTION PANEL
ALT: 52 U/L — ABNORMAL HIGH (ref 0–35)
AST: 83 U/L — ABNORMAL HIGH (ref 0–37)
Albumin: 3.5 g/dL (ref 3.5–5.2)
Alkaline Phosphatase: 89 U/L (ref 39–117)
Bilirubin, Direct: 0.6 mg/dL — ABNORMAL HIGH (ref 0.0–0.3)
Total Bilirubin: 1.7 mg/dL — ABNORMAL HIGH (ref 0.2–1.2)
Total Protein: 6.6 g/dL (ref 6.0–8.3)

## 2014-05-13 LAB — BILIRUBIN, TOTAL: Total Bilirubin: 1.9 mg/dL — ABNORMAL HIGH (ref 0.2–1.2)

## 2014-05-13 LAB — BILIRUBIN,DIRECT & INDIRECT (FRACTIONATED)
Bilirubin, Direct: 0.7 mg/dL — ABNORMAL HIGH (ref 0.0–0.3)
Indirect Bilirubin: 1.2 mg/dL (ref 0.2–1.2)

## 2014-05-13 NOTE — Progress Notes (Addendum)
    Daily Group Progress Note  Program: CD-IOP   Group Time: 1-2:30 pm  Participation Level: Active  Behavioral Response: Appropriate and Sharing  Type of Therapy: Process Group  Topic: Process.: The first part of group was spent in process. Members shared about their activities relative to recovery that they did over the weekend. One member checked in with a new sobriety date. After the check-in, her weekend was diagrammed on the board. Group member were able to offer good feedback about what she might have done differently to avoid relapsing. There was good disclosure among members and the member with the new sobriety date was quite receptive to the comments.   Group Time: 2:45- 4pm  Participation Level: Active  Behavioral Response: Sharing  Type of Therapy: Psycho-education Group  Topic: Pros & Cons of Using VS Sobriety/Graduation: the second half of group was spent in a psycho-ed. A grid was drawn on the board and group members invited to identify the pros and cons of active addiction versus the pros and cons of sobriety. Members easily identified many cons of drinking and drugging. They identified a number of pros as well. On the sobriety side, there were many pros, but very few cons. As the end of group approached, a graduation ceremony was held honoring a successfully departing group member. 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  There were very thoughtful and kind words shared by all.   Summary: The patient checked-in with a new sobriety date of today. After the check-in, I diagrammed her weekend and we began to explore what things she might have done differently before she opted to buy the wine and drink it. The patient described some actions on Friday that we all agreed were inappropriate. When questioned about what she had done and why? The patient wasn't able to provide an answer. Today, the patient reported she had gone to an  [place] meeting and met 3 other women in this program. Then they had gone out to lunch. The patient was remorseful and cried as she described her relapse. She admitted she had been embarrassed and was hesitant to come to the group today because she would have disappointed her fellow group members. Others quickly pointed out that she didn't disappoint anyone and they were all glad she had returned to group. In the psycho-ed, the patient reported one of the cons of sobriety is that she is unsure who she really is, because she has been drinking for so long. She admitted she was scared to find out who she really is. The patient identified one of the cons of drinking as the health damage and potential death she may have if she continues to drink. The patient was tearful during the graduation and expressed her gratitude towards the member who was completing today. She responded well to this intervention, but it remains to be determined whether this patient needs to enter residential treatment. Her new sobriety date is 3/21.  Family Program: Family present? No   Name of family member(s):   UDS collected: No Results:   AA/NA attended?: [place]  Sponsor?: No   [name] [name], LCAS

## 2014-05-13 NOTE — Telephone Encounter (Signed)
Pt returned call and is aware of Dr. Ansel Bong recommendations.  Pt states she will contact psychiatrist.

## 2014-05-14 ENCOUNTER — Other Ambulatory Visit (HOSPITAL_COMMUNITY): Payer: No Typology Code available for payment source

## 2014-05-14 ENCOUNTER — Telehealth (HOSPITAL_COMMUNITY): Payer: Self-pay | Admitting: Psychology

## 2014-05-14 ENCOUNTER — Telehealth: Payer: Self-pay | Admitting: Family Medicine

## 2014-05-14 NOTE — Telephone Encounter (Signed)
That's fine

## 2014-05-14 NOTE — Telephone Encounter (Signed)
Patient called back to f/u on the below request.  She is scheduled for tomorrow at 10:45.

## 2014-05-14 NOTE — Telephone Encounter (Signed)
Pt's woke up this morning  "feeling" like she is coming down again w/ the same thing she had before (a month ago). Pt wants to stop it before it gets started.  Pt also states she has muscle spasms in her back for several yrs and they have started again b/c she is sitting too much. Wanted appt to see the dr tomorrow. Only same day. pls advise.

## 2014-05-15 ENCOUNTER — Telehealth (HOSPITAL_COMMUNITY): Payer: Self-pay | Admitting: Psychology

## 2014-05-15 ENCOUNTER — Encounter: Payer: Self-pay | Admitting: Family Medicine

## 2014-05-15 ENCOUNTER — Ambulatory Visit (INDEPENDENT_AMBULATORY_CARE_PROVIDER_SITE_OTHER): Payer: No Typology Code available for payment source | Admitting: Family Medicine

## 2014-05-15 VITALS — BP 160/72 | HR 69 | Temp 98.7°F | Wt 255.0 lb

## 2014-05-15 DIAGNOSIS — I10 Essential (primary) hypertension: Secondary | ICD-10-CM

## 2014-05-15 DIAGNOSIS — F1020 Alcohol dependence, uncomplicated: Secondary | ICD-10-CM | POA: Diagnosis not present

## 2014-05-15 DIAGNOSIS — M545 Low back pain, unspecified: Secondary | ICD-10-CM

## 2014-05-15 DIAGNOSIS — J329 Chronic sinusitis, unspecified: Secondary | ICD-10-CM | POA: Diagnosis not present

## 2014-05-15 DIAGNOSIS — B349 Viral infection, unspecified: Secondary | ICD-10-CM

## 2014-05-15 DIAGNOSIS — B9789 Other viral agents as the cause of diseases classified elsewhere: Secondary | ICD-10-CM

## 2014-05-15 MED ORDER — PREDNISONE 50 MG PO TABS: ORAL_TABLET | ORAL | Status: DC

## 2014-05-15 NOTE — Patient Instructions (Signed)
Back Pain Viral Sinusitis ? Bronchitis  All should respond to prednisone  See me back next week-if symptoms persist in regards to sinus pressure, then likely will use antibiotic for further boost  Great job on taking steps to get care for your alcoholism. You are doing great. Keep going to [place] and to group.
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
## 2014-05-16 ENCOUNTER — Other Ambulatory Visit (HOSPITAL_COMMUNITY): Payer: No Typology Code available for payment source | Admitting: Licensed Clinical Social Worker

## 2014-05-16 ENCOUNTER — Encounter (HOSPITAL_COMMUNITY): Payer: Self-pay | Admitting: Medical

## 2014-05-16 ENCOUNTER — Encounter: Payer: Self-pay | Admitting: Family Medicine

## 2014-05-16 VITALS — BP 181/84 | HR 98 | Temp 98.0°F | Resp 20 | Ht 66.5 in | Wt 255.2 lb

## 2014-05-16 DIAGNOSIS — F10239 Alcohol dependence with withdrawal, unspecified: Secondary | ICD-10-CM | POA: Diagnosis not present

## 2014-05-16 DIAGNOSIS — F329 Major depressive disorder, single episode, unspecified: Secondary | ICD-10-CM

## 2014-05-16 DIAGNOSIS — F4312 Post-traumatic stress disorder, chronic: Secondary | ICD-10-CM

## 2014-05-16 DIAGNOSIS — F32A Depression, unspecified: Secondary | ICD-10-CM

## 2014-05-16 DIAGNOSIS — M1990 Unspecified osteoarthritis, unspecified site: Secondary | ICD-10-CM | POA: Insufficient documentation

## 2014-05-16 DIAGNOSIS — F1020 Alcohol dependence, uncomplicated: Secondary | ICD-10-CM

## 2014-05-16 MED ORDER — ACAMPROSATE CALCIUM 333 MG PO TBEC: 666.0000 mg | DELAYED_RELEASE_TABLET | Freq: Three times a day (TID) | ORAL | Status: DC

## 2014-05-16 NOTE — Assessment & Plan Note (Signed)
Patient is doing the best she has in years. I encouraged her that even taking the steps to get help even if she has failures is a big step for her. We discussed that even if she needed residential treatment that her even considering this would be progress. She is fearful of who she will be without alcohol. I have tried to encourage her about a future where she helps other women who struggle with similar issues. I know this is going to be a long road for patient but I am encouraged she is seeking help and willing to be open about her dependence.

## 2014-05-16 NOTE — Assessment & Plan Note (Signed)
Stopped BP medication. Instructed her to restart and warned of consequences of being off medication.
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
## 2014-05-19 ENCOUNTER — Other Ambulatory Visit (HOSPITAL_COMMUNITY): Payer: No Typology Code available for payment source | Admitting: Licensed Clinical Social Worker

## 2014-05-19 DIAGNOSIS — F1020 Alcohol dependence, uncomplicated: Secondary | ICD-10-CM

## 2014-05-19 DIAGNOSIS — F10239 Alcohol dependence with withdrawal, unspecified: Secondary | ICD-10-CM | POA: Diagnosis not present

## 2014-05-19 NOTE — Progress Notes (Signed)
    Daily Group Progress Note  Program: CD-IOP   Group Time: 1-2:30  Participation Level: Active  Behavioral Response: Appropriate and Sharing  Type of Therapy: Process Group  Topic: After checking in, group members took turns sharing about recovery-related activities and challenges they faced since our last group meeting.   Group members were active during this discussion and many group members engaged in feedback to other group members. Drug test results were not available due to holiday. They will be given out Monday. One group member did not show up for group and one group member was out of town for the holiday.  Three members saw the PA, Darlyne Russian.     Group Time: 2:45-4  Participation Level: Active  Behavioral Response: Appropriate and Sharing  Type of Therapy: Psycho-education Group  Topic: The second half of group focused on encouraging new ways of thinking about and responding to anger. Each patient was able to report their physical cues and emotional responses to anger. It was pointed out during group that most people cover up emotions by responding with anger. Group members identified these emotions and agreed that they do cover up other emotions with anger. They also completed a worksheet on personal triggers that led to anger and frustration.    Summary: The patient met with the PA to discuss her relapse. She went to a meeting Monday and also chose a sponsor. She was sick Wednesday and did not come to group. She went to a meeting this morning with other group members and then went out to lunch. The patient reported she never saw her parents express anger, but expresses her own anger by internalizing it. She was able to identify situations with her mother-on-law and daughter triggers her anger. The patient was an active participant in the intervention and expressed understanding of her anger triggers. Her sobriety date remains 3/21.    Family Program: Family  present? No   Name of family member(s):   UDS collected: Yes Results: negative  AA/NA attended?: YesFriday  Sponsor?: Yes   Dalaysia Harms S, Licensed Cli

## 2014-05-20 ENCOUNTER — Encounter (HOSPITAL_COMMUNITY): Payer: Self-pay | Admitting: Psychology

## 2014-05-20 ENCOUNTER — Telehealth: Payer: Self-pay | Admitting: Cardiovascular Disease

## 2014-05-20 ENCOUNTER — Encounter: Payer: Self-pay | Admitting: Family Medicine

## 2014-05-20 ENCOUNTER — Ambulatory Visit (INDEPENDENT_AMBULATORY_CARE_PROVIDER_SITE_OTHER): Payer: No Typology Code available for payment source | Admitting: Family Medicine

## 2014-05-20 VITALS — BP 140/62 | HR 68 | Temp 98.7°F | Wt 250.0 lb

## 2014-05-20 DIAGNOSIS — F1020 Alcohol dependence, uncomplicated: Secondary | ICD-10-CM | POA: Diagnosis not present

## 2014-05-20 DIAGNOSIS — B9789 Other viral agents as the cause of diseases classified elsewhere: Secondary | ICD-10-CM

## 2014-05-20 DIAGNOSIS — K7010 Alcoholic hepatitis without ascites: Secondary | ICD-10-CM | POA: Diagnosis not present

## 2014-05-20 DIAGNOSIS — J329 Chronic sinusitis, unspecified: Secondary | ICD-10-CM | POA: Diagnosis not present

## 2014-05-20 DIAGNOSIS — B349 Viral infection, unspecified: Secondary | ICD-10-CM

## 2014-05-20 DIAGNOSIS — I10 Essential (primary) hypertension: Secondary | ICD-10-CM

## 2014-05-20 DIAGNOSIS — M545 Low back pain, unspecified: Secondary | ICD-10-CM

## 2014-05-20 DIAGNOSIS — R0602 Shortness of breath: Secondary | ICD-10-CM

## 2014-05-20 MED ORDER — BENAZEPRIL-HYDROCHLOROTHIAZIDE 20-12.5 MG PO TABS: 1.0000 | ORAL_TABLET | Freq: Two times a day (BID) | ORAL | Status: DC

## 2014-05-20 MED ORDER — BENAZEPRIL-HYDROCHLOROTHIAZIDE 20-12.5 MG PO TABS: 1.0000 | ORAL_TABLET | Freq: Every day | ORAL | Status: DC

## 2014-05-20 NOTE — Progress Notes (Signed)
[name] [name] is a 59 y.o. female patient. CD-IOP: Individual Counseling Session. I met with the patient as scheduled this morning. She had attended the 10 am AA meeting at the [facility] just down the road. She had met another group member there and the session had been a good one. The patient reported she is not feeling depressed. She reminded me that, as she had reported in group yesterday, she is feeling different now that she has begun taking the Campral. It is a medication to address alcohol cravings and she received a prescription from the program director on Friday. She reported feeling significantly different after just 3 days of taking this medication. I was pleased for her that she is experiencing a reduction in her depressive symptoms and encouraged her to continue going to meetings, speaking with her sponsor and remaining as active as she has been recently. In reviewing her goals of treatment, I applauded her ongoing sobriety - as of today, she has been sober 9 days. She is attending at least 4-5 AA meetings per week and has secured a sponsor. I applauded the news about a reduction in depressive, symptoms, particularly since that is her 3rd treatment goal. We read the other intervention she identified in addition to taking her medications as prescribed. They include: staying sober, engaging in some exercise and watching funny movies. She and her husband are big movie fans and watch them daily on TV and even go to the theatre. The patient admitted she had not exercised since she got sick again last week. She had met with her PCP and is taking the prednisone as prescribed. Today she is scheduled to return and see [name], MD at 2 pm today. The patient stated she might be given a round of anti-biotics. In other news, the patient's daughter has still not begun her new job. I suggested perhaps she could come in with her mother again for another family session? Once she begins her new job, she  will be very limited in availability. The patient and I also discussed the topic we have been addressing in group recently:  Anger. She admitted that she does not like "confrontation". I pointed out that as she has admitted in group, instead of getting outwardly angry, the patient "gets quiet". I suggested we talk more in the next session of what she interprets or experiences as "confrontation" and how we might view it differently. The patient agreed and reported she would speak with her daughter about coming in for another family session. The patient is doing really well at this time in early recovery. She is alcohol-free and is engaged in the [employer] of [employer]. She is also reporting a significant reduction in depressive symptoms. Her PCP is fully aware of her alcoholism and, in fact, had been the one to refer her to this program. The patient displayed good insight and made some excellent comments. She responded well to this intervention. We will continue to follow closely in the days ahead. Her sobriety date remains 3/21.        Sheenah Dimitroff, LCAS

## 2014-05-20 NOTE — Progress Notes (Signed)
    Daily Group Progress Note  Program: CD-IOP   Group Time: 1-2:30  Participation Level: Active  Behavioral Response: Appropriate and Sharing  Type of Therapy: Process Group  Topic: After checking in, group members took turns sharing about recovery-related activities and challenges they faced since our last group meeting.   Group members were active during this discussion and many group members engaged in feedback to other group members. Drug test results were given out. New drug tests were given out to each group member. Two group members reported relapse and good suggestions and feedback was given to those group members.      Group Time: 2:45-4  Participation Level: Active  Behavioral Response: Appropriate and Sharing  Type of Therapy: Psycho-education Group  Topic: The second part of the group focused on the 4 stages of relapse. Patients identified the relapse process begins with a trigger>thought>craving>use. The only way to ensure that a thought won't lead to a relapse is to stop the thought before it leads to a craving. Each patient identified their personal internal and external triggers.    Summary: The patient started taking her Campral Friday and feels like it has helped tremendously with her cravings to drink. She reported she went to an [place] meeting Saturday with another group member and then shopped for the rest of the day. On Sunday she went to brunch with her family and everyone got along well. There was no drinking at her table and she has had no cravings to drink She reported she felt better and had a better outlook on her recovery. Other group members shared they thought she looked brighter today. The patient reported that her daughter ignoring her and being around her mother-in-law are her 2 triggers. She gave explanation of the triggers and reported she understood the 4 stages of relapse. Her sobriety date remains 3/21.    Family Program: Family present? No    Name of family member(s):   UDS collected: Yes Results: Pending  AA/NA attended?: YesMonday and Saturday  Sponsor?: Yes   MACKENZIE,LISBETH S, Licensed Cli

## 2014-05-20 NOTE — Assessment & Plan Note (Signed)
Labs have improved since she has been abstinent in almost every measure. Reinforced importance of abstinence from alcohol.

## 2014-05-20 NOTE — Patient Instructions (Addendum)
BP - Increase benazepril to 20mg -12.5mg  twice a day  Glad sinus pressure is improving, if persists call in augmentin on Monday  With your history of shortness of breath and stress test that did not get ordered, I am going to reach out to Dr. Claiborne Billings.   Low back pain-glad improved on prednisone

## 2014-05-20 NOTE — Assessment & Plan Note (Signed)
Since starting outpatient rehab-only 1 day of setback. Otherwise doing very well. Praised efforts and encouraged to continue to take 1 day at a time. Follow up late April.

## 2014-05-20 NOTE — Telephone Encounter (Signed)
Pt apparently does not have insurance that will cover nuclear stress test, but had issues preventing exercise stress test last year. She presented to family medicine today for visit. Has continued to have same/similar issues that she was seen for by [place] last year. Most recent visit in our office was with Tarri Fuller in Oct 2015  Will route to Dr. Claiborne Billings for recommendation of office visit and/or testing.

## 2014-05-20 NOTE — Telephone Encounter (Signed)
Apolonio Schneiders called in on be half of Dr. Yong Channel wanting to know what the next steps for the pt's care is, now that the pt cant have the Lexi Can because her insurance will not cover it. He would like to know if Dr. Claiborne Billings wanted to move forward with her having the stress test. Please call  Thanks
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
## 2014-05-20 NOTE — Assessment & Plan Note (Signed)
Poor control. Increase to Benazepril-hctz 20-12.5mg  BID. Consider amlodipine as next step.

## 2014-05-21 ENCOUNTER — Other Ambulatory Visit (HOSPITAL_COMMUNITY): Payer: No Typology Code available for payment source | Admitting: Psychology

## 2014-05-21 ENCOUNTER — Other Ambulatory Visit (HOSPITAL_COMMUNITY): Payer: No Typology Code available for payment source

## 2014-05-21 ENCOUNTER — Encounter: Payer: Self-pay | Admitting: Psychiatry

## 2014-05-21 DIAGNOSIS — F329 Major depressive disorder, single episode, unspecified: Secondary | ICD-10-CM

## 2014-05-21 DIAGNOSIS — F1020 Alcohol dependence, uncomplicated: Secondary | ICD-10-CM

## 2014-05-21 DIAGNOSIS — F32A Depression, unspecified: Secondary | ICD-10-CM

## 2014-05-21 DIAGNOSIS — F4312 Post-traumatic stress disorder, chronic: Secondary | ICD-10-CM

## 2014-05-21 NOTE — Telephone Encounter (Signed)
If cannot have nuclear stress test then arrange for f/u ov to re-assess symptoms

## 2014-05-21 NOTE — Telephone Encounter (Signed)
[name] [name] can you make this patient appointment with Dr. Claiborne Billings or Mid level. Thanks.

## 2014-05-22 ENCOUNTER — Encounter: Payer: Self-pay | Admitting: Family Medicine

## 2014-05-23 ENCOUNTER — Encounter (HOSPITAL_COMMUNITY): Payer: Self-pay | Admitting: Psychology

## 2014-05-23 ENCOUNTER — Other Ambulatory Visit (HOSPITAL_COMMUNITY): Payer: PRIVATE HEALTH INSURANCE | Admitting: Psychology

## 2014-05-23 DIAGNOSIS — E039 Hypothyroidism, unspecified: Secondary | ICD-10-CM | POA: Insufficient documentation

## 2014-05-23 DIAGNOSIS — F329 Major depressive disorder, single episode, unspecified: Secondary | ICD-10-CM

## 2014-05-23 DIAGNOSIS — F1020 Alcohol dependence, uncomplicated: Secondary | ICD-10-CM

## 2014-05-23 DIAGNOSIS — F32A Depression, unspecified: Secondary | ICD-10-CM

## 2014-05-23 DIAGNOSIS — F4312 Post-traumatic stress disorder, chronic: Secondary | ICD-10-CM

## 2014-05-23 DIAGNOSIS — K700 Alcoholic fatty liver: Secondary | ICD-10-CM | POA: Insufficient documentation

## 2014-05-23 NOTE — Progress Notes (Signed)
    Daily Group Progress Note  Program: CD-IOP   Group Time: 1-2:30 pm  Participation Level: Active  Behavioral Response: Appropriate  Type of Therapy: Psycho-education Group  Topic: Psycho-ed: the first half of group was spent in a psycho-ed. Two visitors from the [facility]) came to speak to the group. The mission of the group was discussed and then the facilitator led a session on "Fear". The group engaged in the assignment she gave them and a discussion followed. The session was very compelling and group members agreed it was one of the best guest sessions they had experienced.   Group Time: 2:45- 4pm  Participation Level: Active  Behavioral Response: Sharing  Type of Therapy: Process Group  Topic: Process: the second half of group was spent in a process.  Members shared about current issues and concerns they are dealing with in early recovery. A new member was present and she introduced herself to the group. There was good feedback and disclosure among group members. During this session, 2 group members were asked to provide UA's.   Summary: The patient was attentive and engaged in the psycho-ed with the facilitators from the [facility]. When asked what she feared, the patient reported that she feared 'rejection'. She made some good comments as the discussion and evaluation of the fears were evaluated. In process, the patient reported that she had attended 2-AA meetings since the last group session and spoke with her sponsor. The patient reported she had felt so good on Tuesday evening she had made dinner. "I haven't done that in months", she noted. She reported she had met with her PCP and he had changed some of her medications.  The patient continues to display a sense of enthusiasm and happiness not previously displayed. She has maintained that since she began taking the Campral, she has experienced a significant reduction in depressive symptoms. "I am  not depressed", she has stated. Another group member agreed that this patient looks much more relaxed and her face isn't holding the stress or angst she had seen previously. The patient is doing well in her very early recovery. She responded well to this intervention and her sobriety date remains 3/21.    Family Program: Family present? No   Name of family member(s):   UDS collected: No Results:   AA/NA attended?: YesThursday and Friday  Sponsor?: Yes   Charell Faulk, LCAS

## 2014-05-25 ENCOUNTER — Encounter (HOSPITAL_COMMUNITY): Payer: Self-pay | Admitting: Psychology

## 2014-05-25 NOTE — Progress Notes (Signed)
Daily Group Progress Note  Program: CD-IOP   Group Time: 1-2:30 pm  Participation Level: Active  Behavioral Response: Appropriate and Sharing  Type of Therapy: Process Group  Topic: Process: the first half of group was spent in process. Members shared about issues and challenges they are dealing with in early recovery. One member shared about how he had met with his boss and despite appearing to be supportive, he promptly reduced his pay by thousands of dollars. Other members became furious and were very confused when this member appeared at peace and with a plan. A good discussion followed. During group today, the Market researcher met with 3 group members.   Group Time: 2:45- 4pm  Participation Level: Active  Behavioral Response: Appropriate  Type of Therapy: Psycho-education Group  Topic: Psycho-Ed: the second half of group was spent in a psycho-ed. Members were provided with a handout entitled, "Where Do I Get Help". it asked that names be identified under different categories, including family, friends, school, health care ser4vices, etc. the idea behind the handout is to examine where your supports currently reside and work towards enlarging and strengthening them. After a few minutes, members were asked to identify their supports and the categories or areas they come from. Only a few members had a very significant network and few had expanded theirs since entering recovery. The importance of connections within the recovery community was emphasized in this session.   Summary: The patient reported she had attended 2 AA meetings and had spoken daily to her sponsor. They had also met for 90 minutes after the 10 am meetings today. She also read parts of the Big Book at home. The patient reported she is really sleeping much better and managed to get to bed by midnight last night and slept soundly until around 9 am. The patient described a strange sequence of events that occurred last  night after she, her husband and daughter went to a late matinee movie and then out for dinner. Her daughter ordered a selection of beers that were coffee and chocolate enhanced. Her husband actually encouraged her to take a sip of the coffee-flavored beer. Other group members appeared shocked and disgusted to hear about these behaviors in light of the 'raw' nature of her sobriety and, what appear to be almost intentional efforts to tease or tempt her. The patient admitted she didn't think it was very thoughtful and when asked, her husband admitted that he hadn't been thinking when he encouraged her to sip the beer. In the psycho-ed, the patient identified the previously mentioned family members as sources of support and also women she has met in this program and are attending AA. I pointed out that there are no people other than ones she has met here and family that she has identified. It is imperative that she reach out and get to know other women in recovery. Without ongoing support and some sort of network, it will be almost impossible for this patient to remain sober once she leaves the program. We will continue to work closely with this patient. She reported continued lack of cravings and a markedly improved mood about her since beginning the Campral. The patient responded well to this intervention and her sobriety date remains 3/21.    Family Program: Family present? No   Name of family member(s):   UDS collected: No Results:   AA/NA attended?: YesThursday and Friday  Sponsor?: Yes   Andreia Gandolfi, LCAS

## 2014-05-26 ENCOUNTER — Other Ambulatory Visit (HOSPITAL_COMMUNITY): Payer: PRIVATE HEALTH INSURANCE | Attending: Psychiatry | Admitting: Licensed Clinical Social Worker

## 2014-05-26 DIAGNOSIS — F1020 Alcohol dependence, uncomplicated: Secondary | ICD-10-CM | POA: Diagnosis not present

## 2014-05-26 DIAGNOSIS — F329 Major depressive disorder, single episode, unspecified: Secondary | ICD-10-CM | POA: Diagnosis not present

## 2014-05-26 DIAGNOSIS — E039 Hypothyroidism, unspecified: Secondary | ICD-10-CM | POA: Diagnosis not present

## 2014-05-26 DIAGNOSIS — K700 Alcoholic fatty liver: Secondary | ICD-10-CM | POA: Diagnosis not present

## 2014-05-27 ENCOUNTER — Encounter (HOSPITAL_COMMUNITY): Payer: Self-pay | Admitting: Licensed Clinical Social Worker

## 2014-05-27 NOTE — Progress Notes (Signed)
    Daily Group Progress Note  Program: CD-IOP   Group Time: 1-2:30  Participation Level: Active  Behavioral Response: Appropriate and Sharing  Type of Therapy: Process Group  Topic: After checking in, group members took turns sharing about recovery-related activities and challenges they faced since our last group meeting.   Group members were active during this discussion and many group members engaged in feedback to other group members. Drug tests were given out to each group member. Two group members were absent. One group member went back to work today and another group member is sick.      Group Time: 2:45-4  Participation Level: Active  Behavioral Response: Appropriate and Sharing  Type of Therapy: Psycho-education Group  Topic: The second part of group was about emotional buttons. Emotional buttons are related to our social needs. We all want to be loved, accepted and approved by others. These are normal healthy human needs. However, when these needs become too intense, they are no longer healthy and can overtake our lives. People that are closest to [place] know how to push our buttons. Each group participant was given a handout on the emotional buttons and each patient was able to identify their personal emotional buttons. Group members appeared very honest in this activity.    Summary: Patient reported she went to a meeting Saturday with another group member. She also spent family time over most of the weekend. This morning she went to a meeting with other group members and then they went to lunch and then shopping together.  The patient tearfully reported that she definitely has the rejection button. She feels hurt when others reject her, especially her daughter. She also reported she doesn't like confrontation or conflict. It was pointed out to the patient there is appropriate confrontation and showed her what that would look like. The patient was attentive during the activity  and participated appropriately. Her sobriety date remains 3/21.    Family Program: Family present? No   Name of family member(s):   UDS collected: Yes Results: Pending  AA/NA attended?: YesMonday and Saturday  Sponsor?: Yes   MACKENZIE,LISBETH S, Licensed Cli

## 2014-05-28 ENCOUNTER — Other Ambulatory Visit (HOSPITAL_COMMUNITY): Payer: PRIVATE HEALTH INSURANCE | Admitting: Psychology

## 2014-05-28 DIAGNOSIS — F4312 Post-traumatic stress disorder, chronic: Secondary | ICD-10-CM

## 2014-05-28 DIAGNOSIS — F32A Depression, unspecified: Secondary | ICD-10-CM

## 2014-05-28 DIAGNOSIS — F329 Major depressive disorder, single episode, unspecified: Secondary | ICD-10-CM

## 2014-05-28 DIAGNOSIS — F1020 Alcohol dependence, uncomplicated: Secondary | ICD-10-CM

## 2014-05-29 ENCOUNTER — Encounter (HOSPITAL_COMMUNITY): Payer: Self-pay | Admitting: Psychology

## 2014-05-29 ENCOUNTER — Encounter: Payer: Self-pay | Admitting: Family Medicine

## 2014-05-29 NOTE — Progress Notes (Signed)
    Daily Group Progress Note  Program: CD-IOP   Group Time: 1-2:30 pm  Participation Level: Active  Behavioral Response: Appropriate and Sharing  Type of Therapy: Process Group  Topic: Process: the first part of group was spent in process. Members shared about any struggles or challenges they have experienced in early recovery. Two group members were present who had missed the last session and they were asked to catch the group up with their lives in early recovery. Drug tests were returned from Monday and collected from the 2 members who had missed the previous session.   Group Time: 2:45- 4pm  Participation Level: Active  Behavioral Response: Sharing  Type of Therapy: Psycho-education Group  Topic: "Emotional Buttons", Part II: the second half of group was spent in a psycho-ed. It was a continuation of the previous session on "Emotional Buttons". The handout was passed out to those that did not bring theirs with them today and the handout was continued where we had left off on Monday. The "Control Button" was discussed at length with members providing examples of their efforts to control others, events or situations. Also discussed were the Rescue Button, Helplessness and Injustice Buttons. There was a good discussion with excellent feedback among members and some very good points made by group members.   Summary: The patient reported she had attended 2-AA meetings since the last group session. She also met with me and her daughter for a family counseling session. She provided good feedback to another member who was sharing about her anxiety and this patient reported she finds coloring books "very relaxing" for her and something she does often. The patient talked about the inadequacy button and the injustice button. These are both very powerful within her. The patient made some good comments and continues to make good progress in early recovery.  Her treatment goals include: sobriety,  building a network of support among other women in recovery and addressing her chronic depression. The patient has experienced a significant reduction in depressive symptoms and is progressing well as relates to the other 2 goals of treatment. Her sobriety date remains 3/21.   Family Program: Family present? No   Name of family member(s):   UDS collected: No Results: Monday's test was negative  AA/NA attended?: [place] and Wednesday  Sponsor?: Yes   [name] Pianka, LCAS

## 2014-05-30 ENCOUNTER — Other Ambulatory Visit (HOSPITAL_COMMUNITY): Payer: PRIVATE HEALTH INSURANCE

## 2014-06-02 ENCOUNTER — Encounter: Payer: Self-pay | Admitting: Adult Health

## 2014-06-02 ENCOUNTER — Other Ambulatory Visit (HOSPITAL_COMMUNITY): Payer: PRIVATE HEALTH INSURANCE | Admitting: Psychology

## 2014-06-02 ENCOUNTER — Ambulatory Visit (INDEPENDENT_AMBULATORY_CARE_PROVIDER_SITE_OTHER): Payer: No Typology Code available for payment source | Admitting: Adult Health

## 2014-06-02 VITALS — BP 108/72 | Temp 98.4°F | Wt 243.0 lb

## 2014-06-02 DIAGNOSIS — F32A Depression, unspecified: Secondary | ICD-10-CM

## 2014-06-02 DIAGNOSIS — F1020 Alcohol dependence, uncomplicated: Secondary | ICD-10-CM

## 2014-06-02 DIAGNOSIS — R300 Dysuria: Secondary | ICD-10-CM

## 2014-06-02 DIAGNOSIS — F329 Major depressive disorder, single episode, unspecified: Secondary | ICD-10-CM

## 2014-06-02 DIAGNOSIS — F4312 Post-traumatic stress disorder, chronic: Secondary | ICD-10-CM

## 2014-06-02 LAB — POCT URINALYSIS DIPSTICK
Bilirubin, UA: NEGATIVE
Glucose, UA: NEGATIVE
Ketones, UA: NEGATIVE
Nitrite, UA: NEGATIVE
Spec Grav, UA: 1.025
Urobilinogen, UA: 4
pH, UA: 5.5

## 2014-06-02 MED ORDER — PHENAZOPYRIDINE HCL 200 MG PO TABS: 200.0000 mg | ORAL_TABLET | Freq: Three times a day (TID) | ORAL | Status: DC | PRN

## 2014-06-02 MED ORDER — CIPROFLOXACIN HCL 250 MG PO TABS
250.0000 mg | ORAL_TABLET | Freq: Two times a day (BID) | ORAL | Status: DC
Start: 2014-06-02 — End: 2014-06-05

## 2014-06-02 NOTE — Patient Instructions (Addendum)
Your urinalysis showed that you have a UTI. I will send a prescription for Cirpo which you will take for three days. I will send a culture and will have those results back in 2 days. If we need to change the prescription I will call you. If you are not feeling better in two days please let me know. I also sent a prescription for pyridium, which will help with the discomfort.   Urinary Tract Infection Urinary tract infections (UTIs) can develop anywhere along your urinary tract. Your urinary tract is your body's drainage system for removing wastes and extra water. Your urinary tract includes two kidneys, two ureters, a bladder, and a urethra. Your kidneys are a pair of bean-shaped organs. Each kidney is about the size of your fist. They are located below your ribs, one on each side of your spine. CAUSES Infections are caused by microbes, which are microscopic organisms, including fungi, viruses, and bacteria. These organisms are so small that they can only be seen through a microscope. Bacteria are the microbes that most commonly cause UTIs. SYMPTOMS  Symptoms of UTIs may vary by age and gender of the patient and by the location of the infection. Symptoms in young women typically include a frequent and intense urge to urinate and a painful, burning feeling in the bladder or urethra during urination. Older women and men are more likely to be tired, shaky, and weak and have muscle aches and abdominal pain. A fever may mean the infection is in your kidneys. Other symptoms of a kidney infection include pain in your back or sides below the ribs, nausea, and vomiting. DIAGNOSIS To diagnose a UTI, your caregiver will ask you about your symptoms. Your caregiver also will ask to provide a urine sample. The urine sample will be tested for bacteria and white blood cells. White blood cells are made by your body to help fight infection. TREATMENT  Typically, UTIs can be treated with medication. Because most UTIs are  caused by a bacterial infection, they usually can be treated with the use of antibiotics. The choice of antibiotic and length of treatment depend on your symptoms and the type of bacteria causing your infection. HOME CARE INSTRUCTIONS  If you were prescribed antibiotics, take them exactly as your caregiver instructs you. Finish the medication even if you feel better after you have only taken some of the medication.  Drink enough water and fluids to keep your urine clear or pale yellow.  Avoid caffeine, tea, and carbonated beverages. They tend to irritate your bladder.  Empty your bladder often. Avoid holding urine for long periods of time.  Empty your bladder before and after sexual intercourse.  After a bowel movement, women should cleanse from front to back. Use each tissue only once. SEEK MEDICAL CARE IF:   You have back pain.  You develop a fever.  Your symptoms do not begin to resolve within 3 days. SEEK IMMEDIATE MEDICAL CARE IF:   You have severe back pain or lower abdominal pain.  You develop chills.  You have nausea or vomiting.  You have continued burning or discomfort with urination. MAKE SURE YOU:   Understand these instructions.  Will watch your condition.  Will get help right away if you are not doing well or get worse. Document Released: 11/17/2004 Document Revised: 08/09/2011 Document Reviewed: 03/18/2011 [facility] Patient Information 2015 [place], [place]. This information is not intended to replace advice given to you by your health care provider. Make sure you discuss any  questions you have with your health care provider.  

## 2014-06-02 NOTE — Addendum Note (Signed)
Addended by: Apolinar Junes on: 06/02/2014 10:38 AM   Modules accepted: Miquel Dunn

## 2014-06-02 NOTE — Progress Notes (Addendum)
   Subjective:    Patient ID: [name] [name], female    DOB: 03/31/1955, 59 y.o.   MRN: [id number]  HPI Patient presents with urinary frequency, burning "throbbing" pain with urination with turbid urine with some blood sine Wednesday night. Feels as though this is how her previous UTI's have been. Denies fevers, nausea or vomiting.   Also endorses "dark stools, but when I wipe, it is green." No abdominal pain, cramping, or other GI issues.    Review of Systems  All other systems reviewed and are negative.      Objective:   Physical Exam  Constitutional: She is oriented to person, place, and time. She appears well-developed and well-nourished. No distress.  Cardiovascular: Normal rate, regular rhythm and normal heart sounds.  Exam reveals no gallop and no friction rub.   No murmur heard. Pulmonary/Chest: Effort normal and breath sounds normal. No respiratory distress. She has no wheezes.  Abdominal: Soft. Bowel sounds are normal. She exhibits no distension. There is no tenderness. There is no rebound.  Genitourinary:  Defers rectal at this time.   No CVA tenderness  Neurological: She is alert and oriented to person, place, and time.  Skin: Skin is warm and dry.      Assessment & Plan:  UTI - UA shows + UTI - Sent for culture - Prescribed Cipro 250 x 3 days.  - Prescribed Pyridium 200 x 2 days.  - Follow up if not feeling better in 2-3 days or sooner if symptoms worsen.  - Will follow up depending what urine culture shows.   Dark Stools - Refused rectal exam at this time.  - Wants to stop taking iron pills for 2 weeks to see if her stools change - Will follow up with MD Yong Channel if she continues to have dark stools - Explained the seriousness of GI bleeds and signs and symptoms.

## 2014-06-02 NOTE — Addendum Note (Signed)
Addended by: Colleen Can on: 06/02/2014 10:31 AM   Modules accepted: Orders

## 2014-06-03 ENCOUNTER — Encounter (HOSPITAL_COMMUNITY): Payer: Self-pay | Admitting: Psychology

## 2014-06-03 NOTE — Progress Notes (Signed)
    Daily Group Progress Note  Program: CD-IOP   Group Time: 1-2:30 pm  Participation Level: Active  Behavioral Response: Appropriate  Type of Therapy: Psycho-education Group  Topic: Psycho-Ed: the first part of group was spent with a guest speaker. The speaker had graduated successfully from this program in June of 2014. She had struggled upon leaving and relapsed a number of times before fully committing to the [employer]. She recounted her journey and described what she does every day and what has led to over 9 months of sobriety. Her talk was funny, sad and emotional at times. Her journey was compelling and group members were very attentive and asked good questions throughout this session.    Group Time: 2:45- 4pm  Participation Level: Active  Behavioral Response: Appropriate and Sharing  Type of Therapy: Process Group  Topic: Process/Graduation: the second half of group was spent in process. Members shared about challenges and issues that have arisen in early recovery. One member checked in with a new sobriety date of today. Her relapse over the weekend was diagrammed and group members provided good feedback. A new group member was present and she introduced herself and shared a little of her journey. As the session neared the end, a graduation ceremony was held in honor of a group member who was completing the program successfully today.   Summary: The patient returned today after having missed group on Friday. She had been excused to go on a long-ago planned weekend trip to [facility]. She and her husband belong to an RV club and they, along with about 10 other couples, camped for the weekend in the [place] part of the state. She was attentive and appeared to enjoy the guest speaker. The guest speaker, like this patient, was a Engineer, structural and she was seen nodding her understanding as the guest shared about the unique issues of addiction and being in Research scientist (life sciences). In process, the patient reported she had phoned her sponsor 2-3 times over the weekend and last attended an [place] meeting on Thursday, prior to their departure for the campgrounds. During process, another group member stated she felt as if this patient has 'blossomed" since entering the program. Other members agreed and commented on how relaxed and peaceful she now appears versus her anxiety and struggle when she first came to the program. One member laughed and she recounted how, on the first day, I had asked if she would share something about herself only to be told, "No".  The patient admitted that she feels very comfortable and safe in group and feels strongly about her fellow group members. She had words of gratitude and respect for the graduating member and was teary as she thanked her for having been so kind and inviting when she first arrived her. The patient is doing well and making good progress. She does present in much improved manner then when she first arrived and appears to be dressing better and taking more pride in herself. The patient responded well to this intervention and her sobriety date remains 3/21.    Family Program: Family present? No   Name of family member(s):   UDS collected: Yes Results: pending  AA/NA attended?: YesThursday and Monday  Sponsor?: Yes   Shametra Cumberland, LCAS

## 2014-06-04 ENCOUNTER — Encounter: Payer: Self-pay | Admitting: Family Medicine

## 2014-06-04 ENCOUNTER — Other Ambulatory Visit (HOSPITAL_COMMUNITY): Payer: PRIVATE HEALTH INSURANCE | Admitting: Licensed Clinical Social Worker

## 2014-06-04 ENCOUNTER — Other Ambulatory Visit (HOSPITAL_COMMUNITY): Payer: Self-pay | Admitting: Medical

## 2014-06-04 DIAGNOSIS — F1020 Alcohol dependence, uncomplicated: Secondary | ICD-10-CM

## 2014-06-05 ENCOUNTER — Encounter: Payer: Self-pay | Admitting: Cardiovascular Disease

## 2014-06-05 ENCOUNTER — Ambulatory Visit (INDEPENDENT_AMBULATORY_CARE_PROVIDER_SITE_OTHER): Payer: No Typology Code available for payment source | Admitting: Cardiovascular Disease

## 2014-06-05 ENCOUNTER — Other Ambulatory Visit: Payer: Self-pay

## 2014-06-05 ENCOUNTER — Encounter: Payer: Self-pay | Admitting: Psychiatry

## 2014-06-05 VITALS — BP 140/68 | HR 98 | Ht 66.0 in | Wt 245.2 lb

## 2014-06-05 DIAGNOSIS — R079 Chest pain, unspecified: Secondary | ICD-10-CM

## 2014-06-05 DIAGNOSIS — R0789 Other chest pain: Secondary | ICD-10-CM | POA: Diagnosis not present

## 2014-06-05 DIAGNOSIS — R0602 Shortness of breath: Secondary | ICD-10-CM

## 2014-06-05 DIAGNOSIS — I10 Essential (primary) hypertension: Secondary | ICD-10-CM | POA: Diagnosis not present

## 2014-06-05 DIAGNOSIS — Z62810 Personal history of physical and sexual abuse in childhood: Secondary | ICD-10-CM

## 2014-06-05 DIAGNOSIS — E785 Hyperlipidemia, unspecified: Secondary | ICD-10-CM | POA: Diagnosis not present

## 2014-06-05 LAB — URINE CULTURE: Colony Count: >=100000

## 2014-06-05 MED ORDER — CIPROFLOXACIN HCL 250 MG PO TABS: 250.0000 mg | ORAL_TABLET | Freq: Two times a day (BID) | ORAL | Status: DC

## 2014-06-05 NOTE — Patient Instructions (Signed)
Your physician recommends that you return for lab work fasting in 1-2 months.  Your physician has requested that you have an exercise tolerance test.   Your physician recommends that you schedule a follow-up appointment in: 3 months with dr. Claiborne Billings.

## 2014-06-05 NOTE — Telephone Encounter (Signed)
Send her in 2 more days of ciprofloxacin. Her urine was sensitive to this. If her symptoms persist-she needs to drop off another urine for culture on Monday. Please call her to update her

## 2014-06-06 ENCOUNTER — Other Ambulatory Visit (HOSPITAL_COMMUNITY): Payer: PRIVATE HEALTH INSURANCE | Admitting: Licensed Clinical Social Worker

## 2014-06-06 ENCOUNTER — Encounter (HOSPITAL_COMMUNITY): Payer: Self-pay | Admitting: Licensed Clinical Social Worker

## 2014-06-06 DIAGNOSIS — F1020 Alcohol dependence, uncomplicated: Secondary | ICD-10-CM | POA: Diagnosis not present

## 2014-06-06 NOTE — Progress Notes (Signed)
    Daily Group Progress Note  Program: CD-IOP   Group Time: 1-2:30  Participation Level: Active  Behavioral Response: Appropriate and Sharing  Type of Therapy: Process Group  Topic: After checking in, group members took turns sharing about recovery-related activities and challenges they faced since our last group meeting.   Group members were active during this discussion and many group members engaged in feedback to other group members. Drug test results were returned.     Group Time: 2:45-4  Participation Level: Active  Behavioral Response: Appropriate and Sharing  Type of Therapy: Psycho-education Group  Topic: The second part of group focused on ACOA and dysfunctional family traits. Each group member was given "43 Things the Adult Child of An Alcoholic Wants You To Know" worksheet. Most group members were able to identify their own personal family dysfunction, most having been raised amongst chaos. Some members shared being a child of a dysfunctional/alcoholic family was complicated and probably resulted in their personal addiction and stunted emotional growth.   Summary: The patient reported that she went to an [place] and out to lunch with other group members this morning. She decided to make the meeting at [facility] her home group. She stated she met with her sponsor as well. 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 The patient reported tearfully that she feels that being the child of an alcoholic has caused much chaos in her life as well as leading her down the same road of alcoholism. The patient reported she was glad to have psychoeducation on ACOA as she could relate to it. The patient was an active participant during group discussion. Her sobriety date remains 3/21.   Family Program: Family present? No   Name of family member(s):   UDS collected: No Results: negative  AA/NA attended?: [place] and Tuesday  Sponsor?:  Yes   Maeli Spacek S, Licensed Cli

## 2014-06-07 ENCOUNTER — Encounter: Payer: Self-pay | Admitting: Cardiovascular Disease

## 2014-06-07 DIAGNOSIS — I10 Essential (primary) hypertension: Secondary | ICD-10-CM | POA: Insufficient documentation

## 2014-06-07 DIAGNOSIS — R0602 Shortness of breath: Secondary | ICD-10-CM | POA: Insufficient documentation

## 2014-06-07 DIAGNOSIS — R079 Chest pain, unspecified: Secondary | ICD-10-CM | POA: Insufficient documentation

## 2014-06-07 NOTE — Progress Notes (Signed)
Patient ID: [name] [name], female   DOB: 01-27-1956, 59 y.o.   MRN: [id number]   Primary M.D.: Dr. Berline Lopes   HPI:  [name] [name] is a 59 y.o. female who has a 15 year history of type 2 diabetes mellitus, hypertension, as well as hyperlipidemia.  She had developed significant edema on Lotrel and is now on an Astepro HCTZ 20/12.5 for hypertension.  She is on Janumet XR 50/1000 for her diabetes mellitus.  She is treated with simvastatin 40 mg for hyperlipidemia in addition to Zetia 10 mg. he has hypothyroidism on levothyroxine 150 g.  She had lived in [place], but has moved back to the [place] area.  In 2011,an adenosine nuclear study was normal.  This was done because of episodes of chest pain.  An echo Doppler study at that time revealed normal systolic function with mild mitral regurgitation and diminished left ventricular compliance.  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   I saw her last year she had noticed progressive fatigue as well as shortness of breath.  She also has noticed some sharp left-sided chest discomfort intermittently.  Upon questioning of her sleep, she sleeps very poorly.  She snores loudly, she has residual daytime sleepiness, her sleep is nonrestorative, and she often wakes up 2-3 times per night.  She has been followed by Dr. Arnoldo Morale her primary care and more recently has seen Kela Millin, PA-C, in his office.    He underwent an echo Doppler study in October 2015 which showed an ejection fraction of 60-65%.  His aortic valve was mildly thickened.  There was a mean gradient of 8, peak gradient of 15 mm.  Mitral valve leaflets were mildly thickened.  She underwent a sleep study which had been interpreted by Dr. Gwenette [name] in August 2015.  Although her AHI was low and she did not meet criteria for CPAP therapy, She was did not have any REM sleep and it's very possible her sleep apnea assessment was underestimated.  She had mild to  moderate snoring.  She was scheduled to undergo a nuclear perfusion study, but unfortunately this was denied by her insurance company.  She presents for evaluation.  Past Medical History  Diagnosis Date  . Depression   . GERD (gastroesophageal reflux disease)   . Hypertension   . Hyperlipidemia   . Colon polyps   . Thyroid disease   . History of recurrent UTIs   . Anemia     menstrual related  . Arthritis     right ankle  . Diabetes mellitus 12/2009    type 2  . Alcohol problem drinking     rehab  . Acute meniscal tear of knee     Left knee  . Heart murmur   . Shortness of breath     with exertion   . Hypothyroidism   . Anxiety   . Evalluate for OSA (obstructive sleep apnea) 08/15/2013    No OSA on sleep study but may be underestimated.    . Diabetes mellitus, type II   . Liver disease   . 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 Total knee arthroplasty  2009    right  . Arthroscopy left knee  03/11/2011    Surgery was done in [place] for a meniscal tear  . Cesarean section  1987  . Cholecystectomy    .  Tubal ligation  1987  . Tonsillectomy    . Adenoidectomy    . Right rotator cuff surgery     . Right ankle surgery       x 2  . Total knee revision Right 12/16/2013    Procedure: RIGHT TOTAL KNEE REVISION POLY EXCHANGE;  Surgeon: Mauri Pole, MD;  Location: [facility];  Service: Orthopedics;  Laterality: Right;  . Mass excision Left 12/16/2013    Procedure: EXCISION LEFT  DISTAL THIGH MASS;  Surgeon: Mauri Pole, MD;  Location: [facility];  Service: Orthopedics;  Laterality: Left;  . Joint replacement      Allergies  Allergen Reactions  . Adhesive [Tape]     blisters  . Other Other (See Comments)    (Neoprene)--causes blisters.     Current Outpatient Prescriptions  Medication Sig Dispense Refill  . acamprosate (CAMPRAL) 333 MG tablet Take 2 tablets (666 mg total) by  mouth 3 (three) times daily with meals. 180 tablet 2  . B Complex Vitamins (B COMPLEX 100 PO) Take 1 tablet by mouth 2 (two) times daily.    . benazepril-hydrochlorthiazide (LOTENSIN HCT) 20-12.5 MG per tablet Take 1 tablet by mouth 2 (two) times daily. 60 tablet 5  . cetirizine (ZYRTEC) 10 MG tablet Take 10 mg by mouth as needed.     . Cholecalciferol (VITAMIN D3) 2000 UNITS TABS Take 1 tablet by mouth every morning.     . escitalopram (LEXAPRO) 20 MG tablet Take 1 tablet (20 mg total) by mouth daily. 30 tablet 2  . ferrous sulfate 325 (65 FE) MG tablet Take 1 tablet (325 mg total) by mouth 3 (three) times daily after meals.  3  . glimepiride (AMARYL) 2 MG tablet Take 1 tablet (2 mg total) by mouth daily with breakfast. 90 tablet 3  . JANUMET XR 50-1000 MG TB24     . levothyroxine (SYNTHROID, LEVOTHROID) 150 MCG tablet Take 1 tablet (150 mcg total) by mouth daily before breakfast. 30 tablet 5  . meloxicam (MOBIC) 15 MG tablet Take 15 mg by mouth as needed.     . mupirocin ointment (BACTROBAN) 2 % Place 1 application into the nose 2 (two) times daily as needed.    . phenazopyridine (PYRIDIUM) 200 MG tablet Take 1 tablet (200 mg total) by mouth 3 (three) times daily as needed for pain. 6 tablet 0  . sitaGLIPtin-metformin (JANUMET) 50-1000 MG per tablet Take 2 tablets by mouth every evening.    . thiamine (VITAMIN B-1) 50 MG tablet Take 50 mg by mouth every morning.    . traZODone (DESYREL) 50 MG tablet Take 1 tablet (50 mg total) by mouth at bedtime as needed and may repeat dose one time if needed for sleep. 60 tablet 0  . ciprofloxacin (CIPRO) 250 MG tablet Take 1 tablet (250 mg total) by mouth 2 (two) times daily. 4 tablet 0   No current facility-administered medications for this visit.    Socially, she was born in [place], [place].  She was adopted and was raised in [place].  She ultimately moved to the [place] area.  She moved to [place] in 1981 and stayed there consistently until 2005.   Between 2005 in 2010.  She traveled with her husband in a motor trailer.  She's been back to the [place] area recently.  She quit tobacco in 2012.  She quit alcohol in January 2015 per  Family History  Problem Relation Age of Onset  . Adopted: Yes  . COPD  Mother   . Alcohol abuse Father     ROS General: Negative; No fevers, chills, or night sweats HEENT: Negative; No changes in vision or hearing, sinus congestion, difficulty swallowing Pulmonary: Positive for recent respiratory infection in February and March Cardiovascular:  See HPI;  GI: Positive for GERD No nausea, vomiting, diarrhea, or abdominal pain GU: Negative; No dysuria, hematuria, or difficulty voiding Musculoskeletal: Negative; no myalgias, joint pain, or weakness Hematologic/Oncologic: Negative; no easy bruising, bleeding Endocrine: Positive for diabetes mellitus, and hypothyroidism;  Neuro: Negative; no changes in balance, headaches Skin: Negative; No rashes or skin lesions Psychiatric: Positive for depression, currently is continues evaluation at [facility] behavioral health for outpatient care Sleep: Positive for poor sleep No daytime sleepiness, hypersomnolence, bruxism, restless legs, hypnogagnic hallucinations Other comprehensive 14 point system review is negative   Physical Exam BP 140/68 mmHg  Pulse 98  Ht _0  (1.676 m)  Wt 245 lb 3.2 oz (111.222 kg)  BMI 39.60 kg/m2 General: Alert, oriented, no distress.  Moderate obesity Skin: normal turgor, no rashes, warm and dry HEENT: Normocephalic, atraumatic. Pupils equal round and reactive to light; sclera anicteric; extraocular muscles intact; Fundi mild arteriolar narrowing.  No hemorrhages or exudates. Nose without nasal septal hypertrophy Mouth/Parynx benign; Mallinpatti scale 3; partial dentures Neck: No JVD, no carotid bruits; normal carotid upstroke Lungs: clear to ausculatation and percussion; no wheezing or rales Chest wall: without tenderness to  palpitation Heart: PMI not displaced, RRR, s1 s2 normal, 2/6 systolic murmur in the aortic area, and a left sternal border with mild radiation cephalad, no diastolic murmur, no rubs, gallops, thrills, or heaves Abdomen: Moderate obesity;soft, nontender; no hepatosplenomehaly, BS+; abdominal aorta nontender and not dilated by palpation. Back: no CVA tenderness Pulses 2+ Musculoskeletal: full range of motion, normal strength, no joint deformities Extremities: no clubbing cyanosis or edema, Homan's sign negative  Neurologic: grossly nonfocal; Cranial nerves grossly wnl Psychologic: Normal mood and affect  ECG (independently read by me): Normal sinus rhythm at 98 bpm.  QTc interval 467 ms.    June 2015 ECG (independently read by me): Normal sinus rhythm at 77 beats per minute.  QTc interval 457 ms.  PR interval 166 ms.  LABS:  BMET  BMP Latest Ref Rng 04/11/2014 02/26/2014 12/18/2013  Glucose 70 - 99 mg/dL 112(H) 124(H) 155(H)  BUN 6 - 23 mg/dL _1 Creatinine 0.40 - 1.20 mg/dL 0.73 0.8 0.79  Sodium 135 - 145 mEq/L 134(L) 135 136(L)  Potassium 3.5 - 5.1 mEq/L 4.1 5.2(H) 4.5  Chloride 96 - 112 mEq/L 100 97 97  CO2 19 - 32 mEq/L _2 Calcium 8.4 - 10.5 mg/dL 10.4 10.4 9.4     Hepatic Function Panel   Hepatic Function Latest Ref Rng 05/13/2014 05/13/2014 04/11/2014  Total Protein 6.0 - 8.3 g/dL - 6.6 7.2  Albumin 3.5 - 5.2 g/dL - 3.5 3.8  AST 0 - 37 U/L - 83(H) 79(H)  ALT 0 - 35 U/L - 52(H) 39(H)  Alk Phosphatase 39 - 117 U/L - 89 81  Total Bilirubin 0.2 - 1.2 mg/dL 1.9(H) 1.7(H) 2.7(H)  Bilirubin, Direct 0.0 - 0.3 mg/dL 0.7(H) 0.6(H) -     CBC  CBC Latest Ref Rng 05/13/2014 04/11/2014 02/26/2014  WBC 4.0 - 10.5 K/uL 5.5 5.7 6.9  Hemoglobin 12.0 - 15.0 g/dL 13.1 15.2(H) 15.7(H)  Hematocrit 36.0 - 46.0 % 37.7 43.6 46.6(H)  Platelets 150.0 - 400.0 K/uL 185.0 141.0(L) 204.0    MCV 104.7  BNP No results found for: PROBNP  Lipid Panel     Component Value Date/Time    CHOL 112 08/15/2013 1628   TRIG 91 08/15/2013 1628   HDL 53 08/15/2013 1628   CHOLHDL 2.1 08/15/2013 1628   VLDL 18 08/15/2013 1628   LDLCALC 41 08/15/2013 1628      RADIOLOGY: No results found.   ASSESSMENT AND PLAN: Ms. Jakaila Norment is a 59 year old female who has a long-standing history of diabetes mellitus, hypertension, as well as hyperlipidemia for which she been on long-term medical therapy.  Her blood pressure today is relatively controlled on her regimen consisting of benazepril HCT and she denies significant edema.  I reviewed her sleep study.  Although her AHI was 0.6, she was unable to achieve any rim sleep.  Her sleep continues to be poor.  If she develops progressive symptomatology.  Follow-up evaluation may be necessary.  I suspect some of her antidepressant medication may be contributing to her rim suppression.  She continues to experience some vague chest sensation and shortness of breath typically with walking.  The insurance company denied a nuclear perfusion scan.  I will schedule her for routine stress test in light of continued symptomatology and if equivocal, or abnormal, further evaluation with nuclear imaging will be pursued.  Her lipid study was excellent in June 2015. With her MCV of 104 at a future blood draw a B12 and folate level should be assessed.  I will see her in 3 months for cardiology reevaluation.  Time spent: 25 minutes  Troy Sine, MD, [facility] 06/07/2014 11:06 AM

## 2014-06-09 ENCOUNTER — Encounter (HOSPITAL_COMMUNITY): Payer: Self-pay | Admitting: Licensed Clinical Social Worker

## 2014-06-09 ENCOUNTER — Telehealth (HOSPITAL_COMMUNITY): Payer: Self-pay | Admitting: *Deleted

## 2014-06-09 ENCOUNTER — Other Ambulatory Visit (HOSPITAL_COMMUNITY): Payer: PRIVATE HEALTH INSURANCE | Admitting: Licensed Clinical Social Worker

## 2014-06-09 DIAGNOSIS — F1020 Alcohol dependence, uncomplicated: Secondary | ICD-10-CM | POA: Diagnosis not present

## 2014-06-09 MED ORDER — TRAZODONE HCL 50 MG PO TABS: 50.0000 mg | ORAL_TABLET | Freq: Every evening | ORAL | Status: DC | PRN

## 2014-06-09 NOTE — Progress Notes (Signed)
    Daily Group Progress Note  Program: CD-IOP   Group Time: 1-2:30  Participation Level: Active  Behavioral Response: Appropriate and Sharing  Type of Therapy: Process Group  Topic: After checking in, group members took turns sharing about recovery-related activities and challenges they faced since our last group meeting.   Group members were active during the discussion and many group members engaged in giving feedback to other group members. Two drug tests were returned. Four group members saw PA Darlyne Russian.      Group Time: 2:45-4  Participation Level: Active  Behavioral Response: Appropriate and Sharing  Type of Therapy: Psycho-education Group  Topic: The second half of group focused on medications. The pharmacist Kary Kos from [facility] provided information on medications. The group was engaged, attentive and asked many questions. The group also reported on weekend recovery plans.    Summary: The patient reports she went to a meeting Thursday morning and this morning. She went out to lunch after the meeting this morning with some group members after the meeting. She met with her sponsor on Thursday. Her dog had surgery so she is homebound for the weekend caring for the dog. She was quite vocal in her knowledge she gained from the pharmacist and thought the information was useful. Her sobriety date remains 3/21.   Family Program: Family present? No   Name of family member(s):   UDS collected: No Results:   AA/NA attended?: YesThursday and Friday  Sponsor?: Yes   [name] [name] S, Licensed Cli

## 2014-06-09 NOTE — Telephone Encounter (Signed)
[name] [name],   Patient was in group today (06-09-14) for Intensive Outpatient for Substance Abuse.  Patient requested Beth to follow up on her Trazodone RX refill which was sent from pharmacy electronically on 06-04-2014 and never responded to. Beth came to me questioning how patient obtains refill of Trazodone.  I advised Beth only providers in is Dr. Dwyane Dee and [name] [name] will not be in until Wednesday.  Beth stated per patient she has been out now since 06-03-2014. I advised Beth I can speak to Dr. Dwyane Dee about refill only provider in.   I called [facility] patient's last refill was 05-02-2014 per Jenny Reichmann, patient is due.   Dr. Dwyane Dee approved verbally refill with no additional refills.  In the future per Dr. Dwyane Dee will need to speak with [name] [name].   I spoke with Emeterio Reeve., RN he request I send a note to you so you can meet with patient this Friday to discuss further refills.

## 2014-06-10 ENCOUNTER — Other Ambulatory Visit (HOSPITAL_COMMUNITY): Payer: Self-pay | Admitting: Medical

## 2014-06-10 DIAGNOSIS — F1020 Alcohol dependence, uncomplicated: Secondary | ICD-10-CM

## 2014-06-10 DIAGNOSIS — G479 Sleep disorder, unspecified: Secondary | ICD-10-CM

## 2014-06-10 MED ORDER — TRAZODONE HCL 50 MG PO TABS: 50.0000 mg | ORAL_TABLET | Freq: Every evening | ORAL | Status: DC | PRN

## 2014-06-11 ENCOUNTER — Encounter (HOSPITAL_COMMUNITY): Payer: Self-pay

## 2014-06-11 ENCOUNTER — Other Ambulatory Visit (HOSPITAL_COMMUNITY): Payer: PRIVATE HEALTH INSURANCE

## 2014-06-11 ENCOUNTER — Other Ambulatory Visit (HOSPITAL_COMMUNITY): Payer: PRIVATE HEALTH INSURANCE | Admitting: Psychology

## 2014-06-11 DIAGNOSIS — F1020 Alcohol dependence, uncomplicated: Secondary | ICD-10-CM | POA: Diagnosis not present

## 2014-06-11 NOTE — Progress Notes (Signed)
    Daily Group Progress Note  Program: CD-IOP   Group Time: 1-2:30  Participation Level: Active  Behavioral Response: Appropriate and Sharing  Type of Therapy: Process Group  Topic: After checking in, group members took turns sharing about recovery-related activities and challenges they faced since our last group meeting on Friday.   Group members were active during the discussion and many group members engaged in giving feedback to other group members. Two drug tests were returned.      Group Time: 2:45-4  Participation Level: Active  Behavioral Response: Appropriate and Sharing  Type of Therapy: Psycho-education Group  Topic: The second part of group focused on family history of addiction/alcoholism. Patients were given a genogram template to fill out their family tree. The genogram included siblings, parents, aunts, uncles, grandparents, great-grandparents, great uncles and aunts. They were also asked to identify family members with mental illness and addiction/alcoholism. After the patients completed the genogram template they transferred the genogram to poster board. Each patient shared their genogram with the other group members, explaining their family traits, mental illness and addiction/alcoholism. All group members took this activity seriously and appeared open and honest. Group members reported that doing this activity gave more insight to other group members.   Summary: The patient reported she went to a meeting Friday, Saturday and this morning. She dog sat the rest of the weekend. Her dog had just had knee surgery. The patient is not happy with her sponsor. She does not think she is getting what she needs from her current sponsor. She has found a new woman to be her sponsor who she believes will be more of a Product manager. The patient reports she meets with her old sponsor Thursday and will tell her at that time that she has found a new sponsor. The patient's genogram was  limited due to the fact that she was adopted at 1 and had no direct information about her family. She had been told that both of parents and her maternal grandmother were all alcoholics. The patient does not know about any mental illness in her family. She was engaged in the activity. Her sobriety date remains 3/21.   Family Program: Family present? No   Name of family member(s):   UDS collected: No Results:   AA/NA attended?: YesFriday and Saturday  Sponsor?: Yes   [facility]

## 2014-06-12 ENCOUNTER — Encounter (HOSPITAL_COMMUNITY): Payer: Self-pay | Admitting: Licensed Clinical Social Worker

## 2014-06-13 ENCOUNTER — Other Ambulatory Visit (HOSPITAL_COMMUNITY): Payer: PRIVATE HEALTH INSURANCE | Admitting: Psychology

## 2014-06-13 DIAGNOSIS — F1020 Alcohol dependence, uncomplicated: Secondary | ICD-10-CM | POA: Diagnosis not present

## 2014-06-14 ENCOUNTER — Encounter (HOSPITAL_COMMUNITY): Payer: Self-pay | Admitting: Psychology

## 2014-06-14 NOTE — Progress Notes (Signed)
    Daily Group Progress Note  Program: CD-IOP   Group Time: 1-2:30 pm  Participation Level: Active  Behavioral Response: Sharing  Type of Therapy: Psycho-education Group  Topic: To begin, group members took turns sharing about any recovery related activities they had engaged in since the last group meeting, as well as any challenges that they faced.  The chaplain visited group today, and focused on experiencing emotions in the present moment.  Group members took turns discussing what they were feeling and experiencing, as well as providing feedback and advice to each other.  Group Time: 2:45- 4pm  Participation Level: Active  Behavioral Response: Appropriate and Sharing  Type of Therapy: Process Group  Topic:The second portion of group was focused on allowing remaining group members who had not yet had the chance to check in to do so.  There was one new group member today and she took the time to introduce herself and share about what has led her to treatment at this time.  Drug tests were collected today as well.   Summary: The patient stated that she will be receiving her 30 day chip tomorrow and is very excited.  She attended a meeting on Tuesday and went to lunch with a former group member and met with her new sponsor.  She discussed still needing to tell her other sponsor that she will be working with someone new.  The patient is very excited about her new sponsor and stated that they share the "same outlook".  She also attended a second meeting since the last group session.  The patient was active in group discussion.  She is doing well and interventions are proving effective.  Her sobriety date remains 3/21.   Family Program: Family present? No   Name of family member(s):   UDS collected: Yes Results: negative  AA/NA attended?: YesTuesday and Wednesday  Sponsor?: Yes   Emika Tiano, LCAS

## 2014-06-16 ENCOUNTER — Encounter (HOSPITAL_COMMUNITY): Payer: Self-pay | Admitting: Psychology

## 2014-06-16 ENCOUNTER — Other Ambulatory Visit: Payer: Self-pay | Admitting: Cardiovascular Disease

## 2014-06-16 ENCOUNTER — Other Ambulatory Visit (HOSPITAL_COMMUNITY): Payer: PRIVATE HEALTH INSURANCE | Admitting: Licensed Clinical Social Worker

## 2014-06-16 DIAGNOSIS — F1020 Alcohol dependence, uncomplicated: Secondary | ICD-10-CM

## 2014-06-16 DIAGNOSIS — E785 Hyperlipidemia, unspecified: Secondary | ICD-10-CM

## 2014-06-16 DIAGNOSIS — I10 Essential (primary) hypertension: Secondary | ICD-10-CM

## 2014-06-16 DIAGNOSIS — R0789 Other chest pain: Secondary | ICD-10-CM

## 2014-06-16 NOTE — Progress Notes (Signed)
    Daily Group Progress Note  Program: CD-IOP   Group Time: 1-2:30 pm  Participation Level: Active  Behavioral Response: Appropriate and Sharing  Type of Therapy: Process Group  Topic: Process: the first part of group was spent in process. Members shared about what they have done since the last session to strengthen their recovery. Any challenges or struggles they have experienced are also identified. A new member was present in group today and she introduced herself to her new group members. During group today, the program director, CK, met with 2 new group members.  Group Time: 2:45- 4pm  Participation Level: Active  Behavioral Response: Sharing  Type of Therapy: Psycho-education Group  Topic:Psycho-Ed: The Phases of Recovery. The second half of group was spent in a psycho-ed. Members were provided with a handout on the phases of recovery. Included was "A Recovery Checklist" and members identified what they had completed in the past or what they were now dealing with in each phase of recovery. There was a good discussion with members taking turns reading the check-list and sharing their answers from the checklist. The session ended with members disclosing their weekend plans.    Summary: The patient reported she had attended 2 AA meetings since the last group. She reported she had picked up her 30-day chip yesterday and the group applauded this news. The patient noted that she and a former group member had gone to a tattoo parlor and she had gotten a new tattoo. She showed her new tattoo to the group. She shared that her husband has gotten sick and laughed about how he seems to have a low tolerance to discomfort when he is not feeling well. "Both my husband handled it poorly", she reported. Other female group members laughed with this disclosure. The patient reported the [place] meeting this morning had been a good one and the discussion had been about "gratitude'. In the psycho-ed, the  patient reported she had gone through the symptoms so typical in bottoming out, but recognized there are some things she continues to work on those identified in the ambivalence and the commitment categories.  The patient continues to make excellent progress and provided reassurance and comfort to other group members who are struggling with issues she has since gone through. The patient continues to make good progress in her early recovery and presents in a very different and vastly improved condition than previously witnessed. Her sobriety date remains 3/21.   Family Program: Family present? No   Name of family member(s):   UDS collected: No Results:   AA/NA attended?: YesThursday and Friday  Sponsor?: Yes   [name] [name], LCAS

## 2014-06-17 ENCOUNTER — Encounter (HOSPITAL_COMMUNITY): Payer: Self-pay | Admitting: Licensed Clinical Social Worker

## 2014-06-17 ENCOUNTER — Ambulatory Visit (INDEPENDENT_AMBULATORY_CARE_PROVIDER_SITE_OTHER): Payer: No Typology Code available for payment source | Admitting: Family Medicine

## 2014-06-17 ENCOUNTER — Encounter: Payer: Self-pay | Admitting: Family Medicine

## 2014-06-17 VITALS — BP 110/62 | HR 79 | Temp 98.9°F | Wt 250.0 lb

## 2014-06-17 DIAGNOSIS — E119 Type 2 diabetes mellitus without complications: Secondary | ICD-10-CM | POA: Diagnosis not present

## 2014-06-17 DIAGNOSIS — K700 Alcoholic fatty liver: Secondary | ICD-10-CM

## 2014-06-17 DIAGNOSIS — F329 Major depressive disorder, single episode, unspecified: Secondary | ICD-10-CM

## 2014-06-17 DIAGNOSIS — E039 Hypothyroidism, unspecified: Secondary | ICD-10-CM | POA: Diagnosis not present

## 2014-06-17 DIAGNOSIS — F1020 Alcohol dependence, uncomplicated: Secondary | ICD-10-CM | POA: Diagnosis not present

## 2014-06-17 DIAGNOSIS — F32A Depression, unspecified: Secondary | ICD-10-CM

## 2014-06-17 LAB — COMPREHENSIVE METABOLIC PANEL
ALT: 39 U/L — ABNORMAL HIGH (ref 0–35)
AST: 41 U/L — ABNORMAL HIGH (ref 0–37)
Albumin: 3.5 g/dL (ref 3.5–5.2)
Alkaline Phosphatase: 62 U/L (ref 39–117)
BUN: 18 mg/dL (ref 6–23)
CO2: 28 mEq/L (ref 19–32)
Calcium: 10.6 mg/dL — ABNORMAL HIGH (ref 8.4–10.5)
Chloride: 103 mEq/L (ref 96–112)
Creatinine, Ser: 1.12 mg/dL (ref 0.40–1.20)
GFR: 52.77 mL/min — ABNORMAL LOW (ref >60.00)
Glucose, Bld: 104 mg/dL — ABNORMAL HIGH (ref 70–99)
Potassium: 4.9 mEq/L (ref 3.5–5.1)
Sodium: 138 mEq/L (ref 135–145)
Total Bilirubin: 1.9 mg/dL — ABNORMAL HIGH (ref 0.2–1.2)
Total Protein: 6.3 g/dL (ref 6.0–8.3)

## 2014-06-17 LAB — HEMOGLOBIN A1C: Hgb A1c MFr Bld: 5.8 % (ref 4.6–6.5)

## 2014-06-17 LAB — TSH: TSH: 0.08 u[IU]/mL — ABNORMAL LOW (ref 0.35–4.50)

## 2014-06-17 NOTE — Assessment & Plan Note (Signed)
S: last TSH >20. No medication changes. Just reemphasized importance of how to take medication A/P: drastic swing today to hyperthyroid. Symptomatically not hyperthyroid. Will simply plan to recheck TSH and consider T4 at follow up given continued swings in levels.

## 2014-06-17 NOTE — Patient Instructions (Signed)
Update labs today  Thrilled with your progress! Hope liver tests are stable to slightly improved. May take months or years to completely normalize. Must remain off alcohol.   I would be willing to take over lexapro, campral, and trazodone if you are released by psychiatry. I would appreciate their recs on duration of use of campral.   See you next month. Always here for you if something comes up

## 2014-06-17 NOTE — Assessment & Plan Note (Signed)
S: much improved with abstinence from alcohol as well as addition of lexapro, wonders if I will take over lexapro. Trazodone helping greatly with sleep and also asks if I can continue this care.  A/P: Discussed with patient would be willing to provide rx for lexapro and trazodone but wanted her to have at least final meeting with psychiatry to make sure no other concerns and ok for transition to primary care.

## 2014-06-17 NOTE — Assessment & Plan Note (Signed)
S: earned her 30 day chip from [facility] recently, thrilled with progress. Remains on campral which really helps. Will be transitioning to maintenance class soon . Going to AA 5x a week A/P: thrilled for patient, continued to encourage her. I am willing to rx campral but advise from psychiatry on duration of use would be helpful.

## 2014-06-17 NOTE — Assessment & Plan Note (Signed)
S: abstaining as already mentioned. Presume LFTs have improved A/P: check LFTs, continue abstinence from alcohol

## 2014-06-17 NOTE — Progress Notes (Signed)
Pre visit review using our clinic review tool, if applicable. No additional management support is needed unless otherwise documented below in the visit note. 

## 2014-06-17 NOTE — Progress Notes (Signed)
[name], MD Phone: [phone]  Subjective:   [name] [name] is a 59 y.o. year old very pleasant female patient who presents with the following:  Diarrhea resolved off iron  See problem oriented charting- ROS- no RUQ pain, minimal depressed mood. No chest pain or shortness of breath.   Past Medical History- Patient Active Problem List   Diagnosis Date Noted  . Alcoholic fatty liver 09/81/1914    Priority: High  . Hepatitis, alcoholic, acute 78/29/5621    Priority: High  . Type II diabetes mellitus, well controlled 06/23/2010    Priority: High  . Alcohol use disorder, severe, dependence 06/23/2010    Priority: High  . Chronic depression 06/23/2010    Priority: High  . Hypothyroid 07/21/2010    Priority: Medium  . HTN (hypertension) 06/23/2010    Priority: Medium  . Hyperlipidemia 06/23/2010    Priority: Medium  . Osteoarthritis 05/16/2014    Priority: Low  . Chronic post-traumatic stress disorder (PTSD) 05/02/2014    Priority: Low  . Sleep disorder 05/02/2014    Priority: Low  . Family history of alcoholism 05/02/2014    Priority: Low  . Macrocytosis 04/19/2014    Priority: Low  . Fatigue 08/15/2013    Priority: Low  . Obesity (BMI 30-39.9) 06/23/2010    Priority: Low  . History of colonic polyps 04/12/2010    Priority: Low  . Essential hypertension 06/07/2014  . Exertional shortness of breath 06/07/2014  . Chest pain 06/07/2014  . Thyroid nodule 09/17/2012     Medications- reviewed and updated Current Outpatient Prescriptions  Medication Sig Dispense Refill  . acamprosate (CAMPRAL) 333 MG tablet Take 2 tablets (666 mg total) by mouth 3 (three) times daily with meals. 180 tablet 2  . B Complex Vitamins (B COMPLEX 100 PO) Take 1 tablet by mouth 2 (two) times daily.    . benazepril-hydrochlorthiazide (LOTENSIN HCT) 20-12.5 MG per tablet Take 1 tablet by mouth 2 (two) times daily. 60 tablet 5  . cetirizine (ZYRTEC) 10 MG tablet Take 10 mg by mouth as  needed.     . Cholecalciferol (VITAMIN D3) 2000 UNITS TABS Take 1 tablet by mouth every morning.     . escitalopram (LEXAPRO) 20 MG tablet Take 1 tablet (20 mg total) by mouth daily. 30 tablet 2  . glimepiride (AMARYL) 2 MG tablet Take 1 tablet (2 mg total) by mouth daily with breakfast. 90 tablet 3  . JANUMET XR 50-1000 MG TB24     . levothyroxine (SYNTHROID, LEVOTHROID) 150 MCG tablet Take 1 tablet (150 mcg total) by mouth daily before breakfast. 30 tablet 5  . meloxicam (MOBIC) 15 MG tablet Take 15 mg by mouth as needed.     . mupirocin ointment (BACTROBAN) 2 % Place 1 application into the nose 2 (two) times daily as needed.    . sitaGLIPtin-metformin (JANUMET) 50-1000 MG per tablet Take 2 tablets by mouth every evening.    . thiamine (VITAMIN B-1) 50 MG tablet Take 50 mg by mouth every morning.    . traZODone (DESYREL) 50 MG tablet Take 1 tablet (50 mg total) by mouth at bedtime as needed and may repeat dose one time if needed for sleep. 30 tablet 2     Objective: BP 110/62 mmHg  Pulse 79  Temp(Src) 98.9 F (37.2 C)  Wt 250 lb (113.399 kg) Gen: NAD, resting comfortably CV: RRR no murmurs rubs or gallops Lungs: CTAB no crackles, wheeze, rhonchi Abdomen: soft/nontender/nondistended/normal bowel sounds. No rebound or guarding. Obese.  No obvious hepatosplenomegaly Ext: no edema Skin: warm, dry, no rash   Assessment/Plan:  Alcohol use disorder, severe, dependence S: earned her 30 day chip from AA recently, thrilled with progress. Remains on campral which really helps. Will be transitioning to maintenance class soon . Going to AA 5x a week A/P: thrilled for patient, continued to encourage her. I am willing to rx campral but advise from psychiatry on duration of use would be helpful.     Chronic depression S: much improved with abstinence from alcohol as well as addition of lexapro, wonders if I will take over lexapro. Trazodone helping greatly with sleep and also asks if I can  continue this care.  A/P: Discussed with patient would be willing to provide rx for lexapro and trazodone but wanted her to have at least final meeting with psychiatry to make sure no other concerns and ok for transition to primary care.     Alcoholic fatty liver S: abstaining as already mentioned. Presume LFTs have improved A/P: check LFTs, continue abstinence from alcohol     Hypothyroid S: last TSH >20. No medication changes. Just reemphasized importance of how to take medication A/P: drastic swing today to hyperthyroid. Symptomatically not hyperthyroid. Will simply plan to recheck TSH and consider T4 at follow up given continued swings in levels.     1 month. Recheck CMET and TSH, consider T4.   Results for orders placed or performed in visit on 06/17/14 (from the past 24 hour(s))  Hemoglobin A1c     Status: None   Collection Time: 06/17/14  3:07 PM  Result Value Ref Range   Hgb A1c MFr Bld 5.8 4.6 - 6.5 %  TSH     Status: Abnormal   Collection Time: 06/17/14  3:07 PM  Result Value Ref Range   TSH 0.08 (L) 0.35 - 4.50 uIU/mL  Comprehensive metabolic panel     Status: Abnormal   Collection Time: 06/17/14  3:07 PM  Result Value Ref Range   Sodium 138 135 - 145 mEq/L   Potassium 4.9 3.5 - 5.1 mEq/L   Chloride 103 96 - 112 mEq/L   CO2 28 19 - 32 mEq/L   Glucose, Bld 104 (H) 70 - 99 mg/dL   BUN 18 6 - 23 mg/dL   Creatinine, Ser 1.12 0.40 - 1.20 mg/dL   Total Bilirubin 1.9 (H) 0.2 - 1.2 mg/dL   Alkaline Phosphatase 62 39 - 117 U/L   AST 41 (H) 0 - 37 U/L   ALT 39 (H) 0 - 35 U/L   Total Protein 6.3 6.0 - 8.3 g/dL   Albumin 3.5 3.5 - 5.2 g/dL   Calcium 10.6 (H) 8.4 - 10.5 mg/dL   GFR 52.77 (L) >60.00 mL/min

## 2014-06-17 NOTE — Progress Notes (Signed)
    Daily Group Progress Note  Program: CD-IOP   Group Time: 1-2:30  Participation Level: Active  Behavioral Response: Appropriate and Sharing  Type of Therapy: Process Group  Topic: After checking in, group members took turns sharing about recovery-related activities and challenges they faced since the last group on Friday.  Group members were active during the discussion and many group members engaged in giving feedback to other group members. Two new group members joined the group today and introduced themselves to the group. Drug tests were taken today.     Group Time: 2:45-4  Participation Level: Active  Behavioral Response: Appropriate and Sharing  Type of Therapy: Psycho-education Group  Topic: The second part of group focused on Part 2 Phases of Recovery. A continuation of Friday's group entailed each group member identifying issues in recovery they currently deal with or have in the past. Group members were engaged in discussion of the Ambivalence Phase of Recovery.     Summary: The patient reported she rented movies, cooked and cared for her dog most of the weekend. She was tearful in talking about her relationship with her daughter and her own mother. She calls or texts her daughter who does not immediately respond. This is hard for her. The patient is doing extremely well in her recovery program. She will be graduating next week. She went to a meeting this morning. She was actively engaged in the discussion of the recovery checklist. She does acknowledge her life is better sober and working a recovery program. Her sobriety date remains 3/21.   Family Program: Family present? No   Name of family member(s):   UDS collected: Yes Results: Pending  AA/NA attended?: YesMonday  Sponsor?: Yes   MACKENZIE,LISBETH S, Licensed Cli

## 2014-06-18 ENCOUNTER — Other Ambulatory Visit (HOSPITAL_COMMUNITY): Payer: PRIVATE HEALTH INSURANCE | Admitting: Psychology

## 2014-06-18 DIAGNOSIS — F4312 Post-traumatic stress disorder, chronic: Secondary | ICD-10-CM

## 2014-06-18 DIAGNOSIS — F1020 Alcohol dependence, uncomplicated: Secondary | ICD-10-CM | POA: Diagnosis not present

## 2014-06-18 DIAGNOSIS — I10 Essential (primary) hypertension: Secondary | ICD-10-CM

## 2014-06-18 DIAGNOSIS — Z62810 Personal history of physical and sexual abuse in childhood: Secondary | ICD-10-CM

## 2014-06-19 ENCOUNTER — Encounter (HOSPITAL_COMMUNITY): Payer: Self-pay | Admitting: Psychology

## 2014-06-19 NOTE — Progress Notes (Signed)
    Daily Group Progress Note  Program: CD-IOP   Group Time: 1-2:30 pm  Participation Level: Active  Behavioral Response: Appropriate and Sharing  Type of Therapy: Process Group  Topic: After checking in, group members took turns sharing about any recovery related activities they had engaged in since the last group meeting, as well as any challenges they faced.  Anxiety reduction strategies were also discussed and group members provided each other with feedback throughout the session. Drug tests results were returned today and 2 were collected from the newest members.  Group Time: 2:45- 4pm  Participation Level: Active  Behavioral Response: Appropriate  Type of Therapy: Psycho-education Group  Topic: "Self-Care"; the second part of group was spent in a psycho-ed on Self Care.  A PowerPoint presentation was provided followed up by handouts. The topics included diet and sleep. Group members learned about the importance of good nutrition and shared about some of their own habits and patterns, many of which were unhealthy and discouraged. They also discussed strategies for eating in more healthful and consistent ways. Exercise and sleep hygiene strategies were also discussed.  Summary: The patient stated that she had a session with her counselor after group on Monday and went to bed early.  On Tuesday, she attended the 10 am AA meeting, which is her home group and then went to a doctor's appointment with her PCP. Her liver enzymes and other diagnostic numbers had improved considerably since she has eliminated the alcohol.  That evening she and her husband attended a Seder dinner with good friends and it was a very enjoyable and meaningful evening. The patient reported she had attended the 10 am meeting this morning, but she had also gone in early to help make coffee and set up and stayed late to clean up after the meeting. I explained to the group that this is how she will gain friends in  recovery through the Fellowship and I encouraged all of them to either go early or stay late and help out. In addition to daily AA meetings, the patient reminded the group that she speaks with her sponsor every day. The patient is making good progress in her early recovery and responded well to this intervention. Her sobriety date remains 4/21.   Family Program: Family present? No   Name of family member(s):   UDS collected: No Results:   AA/NA attended?: YesTuesday and Wednesday  Sponsor?: Yes   Seferino Oscar, LCAS

## 2014-06-19 NOTE — Progress Notes (Signed)
[name] [name] is a 58 y.o. female patient. CD-IOP: Treatment Plan Update. I met with the patient this morning as scheduled. She reported having had a good weekend. She had attended an AA meeting this morning and also spoken with her new sponsor. The patient recently secured a new sponsor and when asked about this change, she admitted she was very pleased and felt as if this new lady really knew her stuff and would hold her accountable. I explained the need to review her treatment goals and the patient was very receptive. She has made excellent progress with her first 2 goals of treatment. The first one encompasses sobriety. The patient has remained alcohol-free since a slip on March 20th.  She is actively engaged in the [facility] of AA and secured a sponsor and is working the Steps. This was her second treatment goal. Regarding her third goal, which was a reduction of her depressive symptoms, the patient described herself as a "1" now while when she first appeared, "I was a 9 or 10". I congratulated her on how well she has done and how much she has changed, physically and emotionally, since her first group session. The patient agreed and admitted she feels much better about herself and her life when she isn't drinking. When asked about what she would like to do regarding medication management once she is discharged from the program, the patient reported she feels as if her PCP, Stephen Hunter, MD will be able to prescribe them. She wondered about who she can see for a while after she has left and I agreed we could meet weekly6. She will also attend the Wednesday Aftercare group session here that the CD-IOP graduates are offered upon graduation. The treatment plan update was reviewed, signed and completed accordingly. The patient continues to make good progress and has made significant change since entering this program in early March. She is scheduled to graduate in 2 weeks. We will continue to follow this  patient closely in the days ahead. Her sobriety date remains 3/21.          EVANS,ANN, LCAS 

## 2014-06-20 ENCOUNTER — Encounter: Payer: Self-pay | Admitting: Family Medicine

## 2014-06-20 ENCOUNTER — Other Ambulatory Visit (HOSPITAL_COMMUNITY): Payer: PRIVATE HEALTH INSURANCE | Admitting: Psychology

## 2014-06-20 DIAGNOSIS — F1020 Alcohol dependence, uncomplicated: Secondary | ICD-10-CM | POA: Diagnosis not present

## 2014-06-23 ENCOUNTER — Other Ambulatory Visit (HOSPITAL_COMMUNITY)
Payer: No Typology Code available for payment source | Attending: Psychiatry | Admitting: Licensed Clinical Social Worker

## 2014-06-23 ENCOUNTER — Encounter (HOSPITAL_COMMUNITY): Payer: Self-pay | Admitting: Psychology

## 2014-06-23 ENCOUNTER — Telehealth: Payer: Self-pay

## 2014-06-23 DIAGNOSIS — E119 Type 2 diabetes mellitus without complications: Secondary | ICD-10-CM | POA: Diagnosis not present

## 2014-06-23 DIAGNOSIS — K700 Alcoholic fatty liver: Secondary | ICD-10-CM

## 2014-06-23 DIAGNOSIS — F329 Major depressive disorder, single episode, unspecified: Secondary | ICD-10-CM | POA: Diagnosis not present

## 2014-06-23 DIAGNOSIS — F4310 Post-traumatic stress disorder, unspecified: Secondary | ICD-10-CM | POA: Diagnosis not present

## 2014-06-23 DIAGNOSIS — E039 Hypothyroidism, unspecified: Secondary | ICD-10-CM

## 2014-06-23 DIAGNOSIS — F1020 Alcohol dependence, uncomplicated: Secondary | ICD-10-CM | POA: Insufficient documentation

## 2014-06-23 DIAGNOSIS — F419 Anxiety disorder, unspecified: Secondary | ICD-10-CM | POA: Diagnosis not present

## 2014-06-23 DIAGNOSIS — Z609 Problem related to social environment, unspecified: Secondary | ICD-10-CM | POA: Diagnosis not present

## 2014-06-23 DIAGNOSIS — I10 Essential (primary) hypertension: Secondary | ICD-10-CM | POA: Insufficient documentation

## 2014-06-23 NOTE — Telephone Encounter (Signed)
Labs entered.

## 2014-06-23 NOTE — Progress Notes (Signed)
    Daily Group Progress Note  Program: CD-IOP   Group Time: 1-2:30 pm  Participation Level: Active  Behavioral Response: Appropriate and Sharing  Type of Therapy: Process Group  Topic: Process; the first half of group was spent in process. Members shared about the recovery-related activities they had engaged in since the last group session. Also discussed where any challenges or issues that may have threatened their recovery. During group today, the Market researcher met with group members, including 3 for their initial assessment, and a member who was requesting a med check. Two drug tests, both negative, were returned to 2 new group members.   Group Time: 2:45- 4pm  Participation Level: Active  Behavioral Response: Appropriate  Type of Therapy: Psycho-education Group  Topic: "Families: Nurturing vs Dysfunctional". The second half of group was spent in a psycho-ed. A PowerPoint slide show was shown and members were provided with a handout listing the characteristics of a nurturing family as compared to a dysfunctional one. As the group read over the descriptions, members shared about their own families and any characteristics they could identify. The discussion was emotional and painful for some members. Most of those present today grew up in homes with addiction and/or dysfunctional family members. As requested, members had brought in pictures of themselves when they were children and everyone shared in viewing them. Near the conclusion of the session, a graduation ceremony was held for the Intern who had recently completed her studies and would be leaving the program after working with the program for the past 9 months.   Summary: The patient reported she had attended 2 AA meetings since the last group session. She had also gone with a former group member and sat with her while she got a new tattoo. The patient continues to speak with her sponsor daily. In the psycho-ed, the patient  noted that her adopted family had given her all of the things listed under nurturing. However, she became teary as she recounted what she knew about her biological family. She had come to feel very rejected and abandoned about having been "given up".  The group provided good feedback and support for this member.  She admitted this sense of rejection had only fed into her feelings of inadequacy. The patient also acknowledged that this 'damaged' view of self has only fed her drinking over the years. The patient made some good comments and responded well to this intervention. She shared kind words with the departing intern and wished her well. The patient continues to make excellent progress in her recovery and will graduate from this program next week. Her sobriety date remains 3/21.    Family Program: Family present? No   Name of family member(s):   UDS collected: No Results:   AA/NA attended?: YesThursday and Friday  Sponsor?: Yes   Chiann Goffredo, LCAS

## 2014-06-25 ENCOUNTER — Encounter (HOSPITAL_COMMUNITY): Payer: Self-pay | Admitting: Licensed Clinical Social Worker

## 2014-06-25 ENCOUNTER — Other Ambulatory Visit (HOSPITAL_COMMUNITY): Payer: No Typology Code available for payment source | Admitting: Psychology

## 2014-06-25 DIAGNOSIS — F1020 Alcohol dependence, uncomplicated: Secondary | ICD-10-CM | POA: Diagnosis not present

## 2014-06-25 NOTE — Progress Notes (Signed)
    Daily Group Progress Note  Program: CD-IOP   Group Time: 1-2:30  Participation Level: Active  Behavioral Response: Appropriate and Sharing  Type of Therapy: Process Group  Topic: After checking in, group members took turns sharing about recovery-related activities and challenges they faced since the last group on Friday.  Group members were active during the discussion and many group members engaged in giving feedback to other group members. Drug tests were taken today. The second part of group focused on Family Dynamics of Addiction Part 1. Each group member was given a worksheet on the roles family members assume when there is addiction or dysfunction in the family. There was good discussion surrounding each of the family roles. Group members were able to identify what roles they assumed in their family of origin.     Group Time: 2:45-4  Participation Level: Active  Behavioral Response: Appropriate and Sharing  Type of Therapy: Psycho-education Group  Topic: he second part of group focused on Family Dynamics of Addiction Part 1. Each group member was given a worksheet on the roles family members assume when there is addiction or dysfunction in the family. There was good discussion surrounding each of the family roles. Group members were able to identify what roles they assumed in their family of origin.      Summary:The patient went to her brother's house Saturday night where people were drinking. She reported she had no desire to drink. She reported she also had more family time on Sunday. She reports she speaks with her sponsor daily. This morning she went to her regular meeting she attends daily at the [employer]. The patient has responded well to treatment and will be graduating next Monday. She will be referred to the Aftercare Program upon completion of CD-IOP. Her sobriety date remains3/21.   Family Program: Family present? No   Name of family member(s):    UDS collected: Yes Results: pending  AA/NA attended?: YesMonday  Sponsor?: Yes   Anyiah Coverdale S, Licensed Cli

## 2014-06-26 ENCOUNTER — Encounter (HOSPITAL_COMMUNITY): Payer: Self-pay | Admitting: Psychology

## 2014-06-26 NOTE — Progress Notes (Signed)
    Daily Group Progress Note  Program: CD-IOP   Group Time: 1-2:30 pm  Participation Level: Active  Behavioral Response: Appropriate and Sharing  Type of Therapy: Process Group  Topic: Process: The first part of group was spent in process. Group members shared about what they have done since our last group session to support their recovery. They also identified any triggers or issues that may have contributed to cravings. There was good feedback and discussion among members. Drug test results collected on Monday were returned to those members.   Group Time: 2:45-4 pm  Participation Level: Active  Behavioral Response: Sharing  Type of Therapy: Psycho-education Group  Topic: Psycho-Ed: Family Sculpture: the second half of group was spent in an exercise called "Family Sculpture". Group members were asked to 'sculpt' their families using their fellow group members to serve as their family members. They were asked to sculpt their families during some time when they were children. This proved to be a very emotional session and included disclosures about member's lives and childhoods that had not been revealed before. At the conclusion of this session, the facilitators checked in with group members to insure that everyone left feeling in control and stable.   Summary: The patient reported she had attended 2 AA meetings since the last group session. She had not slept well last night and was up until 3 am. She reported she had spoken with her sponsor daily. The patient stood in for at least 2 sculptures and provided good feedback about how it felt to be in her role. During the last sculpture she cried while another group member recounted his mother's death. When I inquired about her tears, the patient reported she was crying for her own mother's passing. She described the last days and how she had finally told her mother that "it is okay to go". The patient's drug test result was negative and she  continues to make excellent progress in her recovery. She will be graduating on Monday from the program and her husband and some former group members will appear for the ceremony at the end of the session. The patient's sobriety date remains 3/21.   Family Program: Family present? No   Name of family member(s):   UDS collected: No Results:   AA/NA attended?: YesTuesday and Wednesday  Sponsor?: Yes   Kizzy Olafson, LCAS

## 2014-06-27 ENCOUNTER — Encounter (HOSPITAL_COMMUNITY): Payer: Self-pay | Admitting: Medical

## 2014-06-27 ENCOUNTER — Other Ambulatory Visit (HOSPITAL_COMMUNITY): Payer: No Typology Code available for payment source | Admitting: Licensed Clinical Social Worker

## 2014-06-27 DIAGNOSIS — F1020 Alcohol dependence, uncomplicated: Secondary | ICD-10-CM | POA: Diagnosis not present

## 2014-06-27 MED ORDER — TRAZODONE HCL 50 MG PO TABS: 50.0000 mg | ORAL_TABLET | Freq: Every evening | ORAL | Status: DC | PRN

## 2014-06-27 NOTE — Progress Notes (Signed)
  [facility] Discharge Summary   [name] [name] [id number]  Date of Admission: 04/30/2014 Date of Discharge: 06/30/2014  Course of Treatment: Completed 25 Group sessions initially struggling to be abstinent.With th addition of Campral and intervention by Medical Director and counseling staff,pt attained Sobriety date of 3/21 and found herself active with women in Alcoholics Anonymous including obtaining sponsorship. She went from being silent an withdrawn in Group to active participant sharing experiences an insights with  fellow group members deemed to very helpful to the group.  Goals and Activities to Help Maintain Sobriety: 1. Stay away from old friends who continue to drink and use mind-altering chemicals. 2. Continue practicing Fair Fighting rules in interpersonal conflicts. 3. Continue alcohol and drug refusal skills and call on support systems. 4. Continue medication management with provider she trusts  Referrals: [facility]   Aftercare services: Provided by [facility] at [facility] 1. Attend AA meetings 90 meetings in 90 days 2. Continue with  sponsor and  home group in [place] 3. Return to Psychotherapist:as needed  Next appointment: As scheduled with PCP Marin Olp MD  Plan of Action to Address Continuing Problems: As above    Client has participated in the development of this discharge plan and has received a copy of this completed plan  Dara Hoyer  06/27/2014   Dara Hoyer, PA-C 06/27/2014

## 2014-06-30 ENCOUNTER — Encounter (HOSPITAL_COMMUNITY): Payer: Self-pay | Admitting: Psychology

## 2014-06-30 ENCOUNTER — Encounter (HOSPITAL_COMMUNITY): Payer: Self-pay | Admitting: Licensed Clinical Social Worker

## 2014-06-30 ENCOUNTER — Other Ambulatory Visit (HOSPITAL_COMMUNITY): Payer: No Typology Code available for payment source | Admitting: Licensed Clinical Social Worker

## 2014-06-30 DIAGNOSIS — F1020 Alcohol dependence, uncomplicated: Secondary | ICD-10-CM | POA: Diagnosis not present

## 2014-06-30 NOTE — Progress Notes (Signed)
    Daily Group Progress Note  Program: CD-IOP   Group Time: 1-2:30  Participation Level: Active  Behavioral Response: Appropriate and Sharing  Type of Therapy: Process Group  Topic: After checking in, group members took turns sharing about recovery-related activities and challenges they faced since the last group on Wednesday. One group member reported she relapsed. The relapse process was mapped out on the whiteboard. Group members were openly engaged in suggestions and feedback to the group member. Three group members saw PA Harold Hedge today. One group member was absent today.   Group Time: 2:45-4  Participation Level: Active  Behavioral Response: Appropriate and Sharing  Type of Therapy: Process Group  Topic: he second part of group was a continuation of challenges they faced since the last group. Two group members also had "a burning desire" that they wanted to discuss. One group member reported she is sad, depressed, and angry. Another group member reported she is agitated, tired and in physical pain. She reported she wants to drink so she can sleep. Several group members gave suggestions and strategies for relapse and a discussion ensued on the effects of PAWS.  Group members showed support to both group members. All group members appeared open and honest in sharing feelings during the process.       Summary: The patient reported she went to a meeting Thursday and this morning. She goes to meetings every morning M-F. She speaks with her sponsor daily by phone and met with her sponsor this morning. The patient was very supportive with both group members and offered emotional support and phone numbers. Patient is going to graduate Monday and group members suggested she may want to consider volunteer work to fill up some of her time she has normally spent in group. She was open to the suggestion. The patient has done extremely well in her recovery. She struggled with her sobriety at  first but has worked hard to learn to live sober. Her sobriety date remains 3/21.   Family Program: Family present? No   Name of family member(s):   UDS collected: Yes Results:   AA/NA attended?: YesThursday and Friday  Sponsor?: Yes   Travarus Trudo S, Licensed Cli

## 2014-07-01 ENCOUNTER — Encounter (HOSPITAL_COMMUNITY): Payer: Self-pay | Admitting: Licensed Clinical Social Worker

## 2014-07-01 NOTE — Progress Notes (Signed)
    Daily Group Progress Note  Program: CD-IOP   Group Time: 1-2:30  Participation Level: Active  Behavioral Response: Appropriate and Sharing  Type of Therapy: Process Group  Topic: After checking in, group members took turns sharing about recovery-related activities and challenges they faced since the last group on Friday. They also shared any urges or cravings they had over the weekend.  Group members were openly engaged in suggestions and feedback to the group member. One group member was absent. Drug tests were given out to each group member. One group member graduated today from the program. The patient graduating received accolades from her fellow group members. Her husband came for the graduation.   Group Time: 2:45-4  Participation Level: Active  Behavioral Response: Appropriate and Sharing  Type of Therapy: Psycho-education Group  Topic: The second part of group focused on cognitive distortions. A staff member from the [facility] began an 8 week program titled "Down the [place]," focusing on cognitive distortions. Each week 2 cognitive distortions will be discussed along with coping tools to use to change thinking patterns. Group members were appropriate, attentive and appeared interested in the topic. Many group members were openly engaged in identifying rational thoughts that can be used to combat the distorted thought.     Summary: Patient completes the program today. Her husband came to the graduation and spoke very kind words about his wife's progress in the program. The patient shared about how wonderful her Mother's Day was with her husband and daughter. She has done well in the program. She has embraced AA, has a home group and a sponsor. She has made friends in the program and goes to lunch with them daily after the meetings. Her relationship with her daughter and husband has improved. As usual, the patient went to her normal AA meeting and out to  lunch with sober friends from [place]. She was an active listener in the discussion on cognitive distortion and completed the worksheet. She was tearful during the graduation and accepted accolades from fellow group members. Her sobriety date remains 3/21.   Family Program: Family present? No   Name of family member(s):   UDS collected: Yes Results:   AA/NA attended?: [place]  Sponsor?: Yes   MACKENZIE,LISBETH S, Licensed Cli

## 2014-07-02 ENCOUNTER — Other Ambulatory Visit (HOSPITAL_COMMUNITY): Payer: No Typology Code available for payment source

## 2014-07-03 ENCOUNTER — Other Ambulatory Visit (INDEPENDENT_AMBULATORY_CARE_PROVIDER_SITE_OTHER): Payer: No Typology Code available for payment source

## 2014-07-03 DIAGNOSIS — K700 Alcoholic fatty liver: Secondary | ICD-10-CM

## 2014-07-03 DIAGNOSIS — E039 Hypothyroidism, unspecified: Secondary | ICD-10-CM | POA: Diagnosis not present

## 2014-07-03 LAB — COMPREHENSIVE METABOLIC PANEL
ALT: 31 U/L (ref 0–35)
AST: 35 U/L (ref 0–37)
Albumin: 3.5 g/dL (ref 3.5–5.2)
Alkaline Phosphatase: 61 U/L (ref 39–117)
BUN: 21 mg/dL (ref 6–23)
CO2: 27 mEq/L (ref 19–32)
Calcium: 10.6 mg/dL — ABNORMAL HIGH (ref 8.4–10.5)
Chloride: 101 mEq/L (ref 96–112)
Creatinine, Ser: 1.22 mg/dL — ABNORMAL HIGH (ref 0.40–1.20)
GFR: 47.8 mL/min — ABNORMAL LOW (ref >60.00)
Glucose, Bld: 214 mg/dL — ABNORMAL HIGH (ref 70–99)
Potassium: 4.5 mEq/L (ref 3.5–5.1)
Sodium: 135 mEq/L (ref 135–145)
Total Bilirubin: 1.5 mg/dL — ABNORMAL HIGH (ref 0.2–1.2)
Total Protein: 6.7 g/dL (ref 6.0–8.3)

## 2014-07-03 LAB — HEPATIC FUNCTION PANEL
ALT: 31 U/L (ref 0–35)
AST: 35 U/L (ref 0–37)
Albumin: 3.5 g/dL (ref 3.5–5.2)
Alkaline Phosphatase: 61 U/L (ref 39–117)
Bilirubin, Direct: 0.5 mg/dL — ABNORMAL HIGH (ref 0.0–0.3)
Total Bilirubin: 1.5 mg/dL — ABNORMAL HIGH (ref 0.2–1.2)
Total Protein: 6.7 g/dL (ref 6.0–8.3)

## 2014-07-03 LAB — TSH: TSH: 0.11 u[IU]/mL — ABNORMAL LOW (ref 0.35–4.50)

## 2014-07-04 ENCOUNTER — Other Ambulatory Visit (HOSPITAL_COMMUNITY): Payer: No Typology Code available for payment source

## 2014-07-04 ENCOUNTER — Encounter: Payer: Self-pay | Admitting: Psychiatry

## 2014-07-04 ENCOUNTER — Telehealth (HOSPITAL_COMMUNITY): Payer: Self-pay

## 2014-07-04 ENCOUNTER — Other Ambulatory Visit: Payer: Self-pay | Admitting: Family Medicine

## 2014-07-04 LAB — T4: T4, Total: 15.9 ug/dL — ABNORMAL HIGH (ref 4.5–12.0)

## 2014-07-04 MED ORDER — LEVOTHYROXINE SODIUM 137 MCG PO TABS: 137.0000 ug | ORAL_TABLET | Freq: Every day | ORAL | Status: DC

## 2014-07-04 NOTE — Telephone Encounter (Signed)
Encounter complete. 

## 2014-07-07 ENCOUNTER — Other Ambulatory Visit (HOSPITAL_COMMUNITY): Payer: No Typology Code available for payment source

## 2014-07-07 ENCOUNTER — Encounter: Payer: Self-pay | Admitting: Psychiatry

## 2014-07-08 NOTE — Progress Notes (Signed)
[name] [name] is a 59 y.o. female patient. CD-IOP: Successful Completion. The patient graduated from the CD-IOP this afternoon. She leaves the program with all of the tools and knowledge necessary in order to remain alcohol-free going forward. Please see Discharge Plan and Summary in Epic.         Anessia Oakland, LCAS

## 2014-07-09 ENCOUNTER — Ambulatory Visit (HOSPITAL_COMMUNITY)
Admission: RE | Admit: 2014-07-09 | Discharge: 2014-07-09 | Disposition: A | Discharge status: Home / Self Care | Payer: No Typology Code available for payment source | Source: Ambulatory Visit | Attending: Cardiology | Admitting: Cardiology

## 2014-07-09 ENCOUNTER — Other Ambulatory Visit (HOSPITAL_COMMUNITY): Payer: No Typology Code available for payment source

## 2014-07-09 ENCOUNTER — Encounter: Payer: Self-pay | Admitting: Psychiatry

## 2014-07-09 DIAGNOSIS — E785 Hyperlipidemia, unspecified: Secondary | ICD-10-CM

## 2014-07-09 DIAGNOSIS — R0789 Other chest pain: Secondary | ICD-10-CM

## 2014-07-09 DIAGNOSIS — I10 Essential (primary) hypertension: Secondary | ICD-10-CM

## 2014-07-09 LAB — EXERCISE TOLERANCE TEST
Estimated workload: 5.8 METS
Exercise duration (min): 4 min
Exercise duration (sec): 0 s
MPHR: 161 {beats}/min
Peak HR: 127 {beats}/min
Percent HR: 80 %
Percent of predicted max HR: 78 %
RPE: 27219
Rest HR: 80 {beats}/min
Stage 1 DBP: 66 mmHg
Stage 1 Grade: 0 %
Stage 1 HR: 83 {beats}/min
Stage 1 SBP: 122 mmHg
Stage 1 Speed: 0 mph
Stage 2 Grade: 0 %
Stage 2 HR: 86 {beats}/min
Stage 2 Speed: 1 mph
Stage 3 Grade: 0.1 %
Stage 3 HR: 86 {beats}/min
Stage 3 Speed: 1 mph
Stage 4 DBP: 96 mmHg
Stage 4 Grade: 10 %
Stage 4 HR: 116 {beats}/min
Stage 4 SBP: 122 mmHg
Stage 4 Speed: 1.7 mph
Stage 5 Grade: 12 %
Stage 5 HR: 127 {beats}/min
Stage 5 Speed: 2.5 mph
Stage 6 DBP: 69 mmHg
Stage 6 Grade: 0 %
Stage 6 HR: 122 {beats}/min
Stage 6 SBP: 211 mmHg
Stage 6 Speed: 0 mph
Stage 7 DBP: 52 mmHg
Stage 7 Grade: 0 %
Stage 7 HR: 85 {beats}/min
Stage 7 SBP: 131 mmHg
Stage 7 Speed: 0 mph

## 2014-07-10 ENCOUNTER — Ambulatory Visit (INDEPENDENT_AMBULATORY_CARE_PROVIDER_SITE_OTHER): Payer: No Typology Code available for payment source | Admitting: Family Medicine

## 2014-07-10 ENCOUNTER — Telehealth: Payer: Self-pay | Admitting: Cardiovascular Disease

## 2014-07-10 ENCOUNTER — Encounter: Payer: Self-pay | Admitting: Family Medicine

## 2014-07-10 VITALS — BP 122/64 | HR 81 | Temp 98.5°F | Wt 245.0 lb

## 2014-07-10 DIAGNOSIS — I10 Essential (primary) hypertension: Secondary | ICD-10-CM

## 2014-07-10 DIAGNOSIS — E785 Hyperlipidemia, unspecified: Secondary | ICD-10-CM | POA: Diagnosis not present

## 2014-07-10 DIAGNOSIS — E039 Hypothyroidism, unspecified: Secondary | ICD-10-CM | POA: Diagnosis not present

## 2014-07-10 DIAGNOSIS — E119 Type 2 diabetes mellitus without complications: Secondary | ICD-10-CM

## 2014-07-10 DIAGNOSIS — Z87891 Personal history of nicotine dependence: Secondary | ICD-10-CM | POA: Insufficient documentation

## 2014-07-10 DIAGNOSIS — F1020 Alcohol dependence, uncomplicated: Secondary | ICD-10-CM

## 2014-07-10 DIAGNOSIS — K700 Alcoholic fatty liver: Secondary | ICD-10-CM

## 2014-07-10 MED ORDER — ACAMPROSATE CALCIUM 333 MG PO TBEC: 666.0000 mg | DELAYED_RELEASE_TABLET | Freq: Three times a day (TID) | ORAL | Status: DC

## 2014-07-10 MED ORDER — VITAMIN B-1 50 MG PO TABS: 50.0000 mg | ORAL_TABLET | ORAL | Status: DC

## 2014-07-10 MED ORDER — ESCITALOPRAM OXALATE 20 MG PO TABS: 20.0000 mg | ORAL_TABLET | Freq: Every day | ORAL | Status: DC

## 2014-07-10 MED ORDER — TRAZODONE HCL 50 MG PO TABS: 50.0000 mg | ORAL_TABLET | Freq: Every evening | ORAL | Status: DC | PRN

## 2014-07-10 NOTE — Assessment & Plan Note (Signed)
S: had to decrease levothyroxine to 137 mcg due to TSH 0.08 and T4 high A/P: still appears euthyroid. Check TSH in about 3 months on lower dose.

## 2014-07-10 NOTE — Assessment & Plan Note (Signed)
S: still abstaining. Overdoing sweets now though. Dealing with alcoholic and fatty liver element likely A/P: LFTs in normal range and bili remains slightly high. Continue abstinence and advised cutting sweets

## 2014-07-10 NOTE — Assessment & Plan Note (Signed)
S: excellent control on Benazepril-hctz 20-12.5mg  BID A/P: calcium high. Monitor at follow up, unfortunately may have to d/c hctz if elevates any more. Continue current rx for now

## 2014-07-10 NOTE — Progress Notes (Signed)
[name] Reddish, MD Phone: [phone]  Subjective:  Patient presents today to establish care with me as their new primary care provider. Patient was formerly a patient of Dr. Arnoldo Morale. Chief complaint-noted.   See problem oriented charting Overdoing sweets, we discussed cutting back. Addictive personality contributes obviously ROS- no RUQ pain, nausea, vomiting  The following were reviewed and entered/updated in epic: Past Medical History  Diagnosis Date  . Depression   . GERD (gastroesophageal reflux disease)   . Hypertension   . Hyperlipidemia   . Colon polyps   . Thyroid disease   . History of recurrent UTIs   . Anemia     menstrual related  . Arthritis     right ankle  . Diabetes mellitus 12/2009    type 2  . Alcohol problem drinking     rehab  . Acute meniscal tear of knee     Left knee  . Heart murmur   . Shortness of breath     with exertion   . Hypothyroidism   . Anxiety   . Evalluate for OSA (obstructive sleep apnea) 08/15/2013    No OSA on sleep study but may be underestimated.    . Diabetes mellitus, type II   . Liver disease   . 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 Alcoholic fatty liver 62/56/3893    Priority: High  . Type II diabetes mellitus, well controlled 06/23/2010    Priority: High  . Alcohol use disorder, severe, dependence 06/23/2010    Priority: High  . Chronic depression 06/23/2010    Priority: High  . Hypothyroid 07/21/2010    Priority: Medium  . HTN (hypertension) 06/23/2010    Priority: Medium  . Hyperlipidemia 06/23/2010    Priority: Medium  . Osteoarthritis 05/16/2014    Priority: Low  . Chronic post-traumatic stress disorder (PTSD) 05/02/2014    Priority: Low  . Sleep disorder 05/02/2014    Priority: Low  . Family history of alcoholism 05/02/2014    Priority: Low  . Macrocytosis 04/19/2014    Priority: Low  . Fatigue 08/15/2013      Priority: Low  . Thyroid nodule 09/17/2012    Priority: Low  . Obesity (BMI 30-39.9) 06/23/2010    Priority: Low  . History of colonic polyps 04/12/2010    Priority: Low  . Former smoker 07/10/2014  . Exertional shortness of breath 06/07/2014  . Chest pain 06/07/2014   Past Surgical History  Procedure Laterality Date  . Total knee arthroplasty  2009    right  . Cesarean section  1987  . Cholecystectomy    . Tubal ligation  1987  . Tonsillectomy    . Adenoidectomy    . Right rotator cuff surgery     . Right ankle surgery       x 2  . Total knee revision Right 12/16/2013    Procedure: RIGHT TOTAL KNEE REVISION POLY EXCHANGE;  Surgeon: Mauri Pole, MD;  Location: [facility];  Service: Orthopedics;  Laterality: Right;  . Mass excision Left 12/16/2013    Procedure: EXCISION LEFT  DISTAL THIGH MASS;  Surgeon: Mauri Pole, MD;  Location: [facility];  Service: Orthopedics;  Laterality: Left;    Family History  Problem Relation Age of Onset  . Adopted: Yes  . COPD Mother   . Alcohol abuse Father     Medications- reviewed and updated Current  Outpatient Prescriptions  Medication Sig Dispense Refill  . acamprosate (CAMPRAL) 333 MG tablet Take 2 tablets (666 mg total) by mouth 3 (three) times daily with meals. 540 tablet 3  . B Complex Vitamins (B COMPLEX 100 PO) Take 1 tablet by mouth 2 (two) times daily.    . benazepril-hydrochlorthiazide (LOTENSIN HCT) 20-12.5 MG per tablet Take 1 tablet by mouth 2 (two) times daily. 60 tablet 5  . cetirizine (ZYRTEC) 10 MG tablet Take 10 mg by mouth as needed.     . Cholecalciferol (VITAMIN D3) 2000 UNITS TABS Take 1 tablet by mouth every morning.     . diazepam (VALIUM) 5 MG tablet     . escitalopram (LEXAPRO) 20 MG tablet Take 1 tablet (20 mg total) by mouth daily. 90 tablet 3  . glimepiride (AMARYL) 2 MG tablet Take 1 tablet (2 mg total) by mouth daily with breakfast. 90 tablet 3  . JANUMET XR 50-1000 MG TB24     . levothyroxine  (SYNTHROID, LEVOTHROID) 137 MCG tablet Take 1 tablet (137 mcg total) by mouth daily before breakfast. 30 tablet 5  . meloxicam (MOBIC) 15 MG tablet Take 15 mg by mouth as needed.     . mupirocin ointment (BACTROBAN) 2 % Place 1 application into the nose 2 (two) times daily as needed.    . sitaGLIPtin-metformin (JANUMET) 50-1000 MG per tablet Take 2 tablets by mouth every evening.    . thiamine (VITAMIN B-1) 50 MG tablet Take 1 tablet (50 mg total) by mouth every morning. 90 tablet 1  . traZODone (DESYREL) 50 MG tablet Take 1 tablet (50 mg total) by mouth at bedtime as needed and may repeat dose one time if needed for sleep. 90 tablet 3   No current facility-administered medications for this visit.    Allergies-reviewed and updated Allergies  Allergen Reactions  . Adhesive [Tape]     blisters  . Other Other (See Comments)    (Neoprene)--causes blisters.     History   Social History  . Marital Status: Married    Spouse Name: N/A  . Number of Children: N/A  . Years of Education: N/A   Social History Main Topics  . Smoking status: Former Smoker -- 1.50 packs/day for 30 years    Types: Cigarettes    Quit date: 02/21/2009  . Smokeless tobacco: Never Used  . Alcohol Use: 14.4 oz/week    14 Cans of beer, 10 Glasses of wine per week     Comment: quit January 2015 started in august 2015 , quit again 2016  . Drug Use: No     Comment: marijuana in past  . Sexual Activity: Yes   Other Topics Concern  . None   Social History Narrative   Married. 1 daughter. No grandkids. 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       Hobbies: shopping, travel   ROS--See HPI   Objective: BP 122/64 mmHg  Pulse 81  Temp(Src) 98.5 F (36.9 C)  Wt 245 lb (111.131 kg) Gen: NAD, resting comfortably HEENT: Mucous membranes are moist. Oropharynx normal CV: RRR no murmurs rubs or gallops Lungs: CTAB no crackles, wheeze, rhonchi Abdomen:  soft/nontender/nondistended/normal bowel sounds. obese Ext: trace edema Skin: warm, dry Neuro: grossly normal, moves all extremities, PERRLA   Assessment/Plan:  Alcohol use disorder, severe, dependence S:AA meetings 5 days a week. 2 month chip tomorrow though Anniversary on 05/12/14. campral very helpful A/P: per psychiatry notes, campral  can be used indefinitely though mechanism unclear. We will plan on at least a year use as of today.     Alcoholic fatty liver S: still abstaining. Overdoing sweets now though. Dealing with alcoholic and fatty liver element likely A/P: LFTs in normal range and bili remains slightly high. Continue abstinence and advised cutting sweets    HTN (hypertension) S: excellent control on Benazepril-hctz 20-12.5mg  BID A/P: calcium high. Monitor at follow up, unfortunately may have to d/c hctz if elevates any more. Continue current rx for now    Hyperlipidemia S: Off of statins due to alcoholic LFTs elevations- likely poor control A/P: check lipids before cardiology follow up. May have to start depending on levels as well as LFTs, also depends on results of stress testing which I encouraged patient to follow up- she reached out today but has not heard back yet as she was unable to complete initial exercise stress test.     Hypothyroid S: had to decrease levothyroxine to 137 mcg due to TSH 0.08 and T4 high A/P: still appears euthyroid. Check TSH in about 3 months on lower dose.     August or September follow up with fasting labs before visit Orders Placed This Encounter  Procedures  . CBC    [facility]    Standing Status: Future     Number of Occurrences:      Standing Expiration Date: 07/10/2015  . Comprehensive metabolic panel    [facility]    Standing Status: Future     Number of Occurrences:      Standing Expiration Date: 07/10/2015    Order Specific Question:  Has the patient fasted?    Answer:  No  . Lipid panel    [facility]    Standing Status:  Future     Number of Occurrences:      Standing Expiration Date: 07/10/2015    Order Specific Question:  Has the patient fasted?    Answer:  No  . Hemoglobin A1c    [facility]    Standing Status: Future     Number of Occurrences:      Standing Expiration Date: 07/10/2015  . TSH    [facility]    Standing Status: Future     Number of Occurrences:      Standing Expiration Date: 07/10/2015    Meds ordered this encounter  Medications  . traZODone (DESYREL) 50 MG tablet    Sig: Take 1 tablet (50 mg total) by mouth at bedtime as needed and may repeat dose one time if needed for sleep.    Dispense:  90 tablet    Refill:  3  . thiamine (VITAMIN B-1) 50 MG tablet    Sig: Take 1 tablet (50 mg total) by mouth every morning.    Dispense:  90 tablet    Refill:  1  . escitalopram (LEXAPRO) 20 MG tablet    Sig: Take 1 tablet (20 mg total) by mouth daily.    Dispense:  90 tablet    Refill:  3  . acamprosate (CAMPRAL) 333 MG tablet    Sig: Take 2 tablets (666 mg total) by mouth 3 (three) times daily with meals.    Dispense:  540 tablet    Refill:  3

## 2014-07-10 NOTE — Patient Instructions (Addendum)
Drop off a copy or have Dr. Evette Georges nurse send me the labs he wanted. We have ordered almost everything except cholesterol which you cannot have until basically early July. I have already ordered a b12 and folate which he had requested. I am happy to talk with his nurse or him if needed.   Thrilled with your progress! Keep up the AA and congratulations on 2 month chip!   Let's check in August or September. I am happy to see you sooner if needed.   Maybe we could line up labs I want- Liver function tests, bmet, a1c, lipids, tsh, CBC with what Dr. Claiborne Billings wants

## 2014-07-10 NOTE — Assessment & Plan Note (Signed)
S:AA meetings 5 days a week. 2 month chip tomorrow though Anniversary on 05/12/14. campral very helpful A/P: per psychiatry notes, campral can be used indefinitely though mechanism unclear. We will plan on at least a year use as of today.

## 2014-07-10 NOTE — Assessment & Plan Note (Signed)
S: Off of statins due to alcoholic LFTs elevations- likely poor control A/P: check lipids before cardiology follow up. May have to start depending on levels as well as LFTs, also depends on results of stress testing which I encouraged patient to follow up- she reached out today but has not heard back yet as she was unable to complete initial exercise stress test.

## 2014-07-10 NOTE — Telephone Encounter (Signed)
Please call,she would like you to call Dr Yong Channel so you can work out what lab work she needs.Please call,she will give you details.

## 2014-07-10 NOTE — Telephone Encounter (Signed)
Pt wanted to wait till July when her insurance will cover lipid test. She will get done at Dr. Ansel Bong office. She had labwork at his office last week.  Instructed her to get CBC & lipid test done when next able.  She also is inquiring about significance of stress test results/next steps - notes she couldn't finish treadmill test due to shortness of breath. Informed her I would defer to Dr. Claiborne Billings.  Component     Latest Ref Rng 06/23/2010  TSH     0.35 - 4.50 uIU/mL 6.47 (H)   T4, Total 4.5 - 12.0 ug/dL 15.9 (H)            Ref Range 7d ago (07/03/14) 7d ago (07/03/14) 3wk ago (06/17/14) 98mo ago (05/13/14) 43mo ago (05/13/14)    Total Bilirubin 0.2 - 1.2 mg/dL 1.5 (H) 1.5 (H) 1.9 (H) 1.9 (H)     Bilirubin, Direct 0.0 - 0.3 mg/dL 0.5 (H)    0.7 (H)    Alkaline Phosphatase 39 - 117 U/L 61 61 62      AST 0 - 37 U/L 35 35 41 (H)      ALT 0 - 35 U/L 31 31 39 (H)      Total Protein 6.0 - 8.3 g/dL 6.7 6.7 6.3      Albumin 3.5 - 5.2 g/dL 3.5 3.5 3.5           Sodium 135 - 145 mEq/L 135  138     Potassium 3.5 - 5.1 mEq/L 4.5  4.9     Chloride 96 - 112 mEq/L 101  103     CO2 19 - 32 mEq/L 27  28     Glucose, Bld 70 - 99 mg/dL 214 (H)  104 (H)     BUN 6 - 23 mg/dL 21  18     Creatinine, Ser 0.40 - 1.20 mg/dL 1.22 (H)  1.12     Total Bilirubin 0.2 - 1.2 mg/dL 1.5 (H) 1.5 (H) 1.9 (H) 1.9 (H)    Alkaline Phosphatase 39 - 117 U/L 61 61 62     AST 0 - 37 U/L 35 35 41 (H)     ALT 0 - 35 U/L 31 31 39 (H)     Total Protein 6.0 - 8.3 g/dL 6.7 6.7 6.3     Albumin 3.5 - 5.2 g/dL 3.5 3.5 3.5     Calcium 8.4 - 10.5 mg/dL 10.6 (H)  10.6 (H)     GFR >60.00 mL/min 47.80 (L)  60.00 mL/min" class="[email or web address]" style="cursor: pointer;" onmouseover='jscript: var varStyle="underline"; var bgColor="#[id number]"; this.style.backgroundColor=bgColor; var children=this.getElementsByTagName("div"); for(var child=0;child 52.77 (L)

## 2014-07-11 ENCOUNTER — Other Ambulatory Visit (HOSPITAL_COMMUNITY): Payer: No Typology Code available for payment source

## 2014-07-11 ENCOUNTER — Encounter: Payer: Self-pay | Admitting: Family Medicine

## 2014-07-14 ENCOUNTER — Other Ambulatory Visit (HOSPITAL_COMMUNITY): Payer: No Typology Code available for payment source

## 2014-07-16 ENCOUNTER — Other Ambulatory Visit (HOSPITAL_COMMUNITY): Payer: No Typology Code available for payment source

## 2014-07-16 NOTE — Telephone Encounter (Signed)
ok 

## 2014-07-18 ENCOUNTER — Other Ambulatory Visit (HOSPITAL_COMMUNITY): Payer: No Typology Code available for payment source

## 2014-07-23 ENCOUNTER — Other Ambulatory Visit (HOSPITAL_COMMUNITY): Payer: No Typology Code available for payment source

## 2014-07-23 ENCOUNTER — Other Ambulatory Visit (HOSPITAL_COMMUNITY): Payer: No Typology Code available for payment source | Attending: Psychiatry

## 2014-07-24 ENCOUNTER — Encounter: Payer: Self-pay | Admitting: Family Medicine

## 2014-07-25 ENCOUNTER — Other Ambulatory Visit (HOSPITAL_COMMUNITY): Payer: No Typology Code available for payment source

## 2014-07-25 ENCOUNTER — Encounter: Payer: Self-pay | Admitting: Cardiovascular Disease

## 2014-07-28 ENCOUNTER — Other Ambulatory Visit (HOSPITAL_COMMUNITY): Payer: No Typology Code available for payment source

## 2014-07-30 ENCOUNTER — Other Ambulatory Visit (HOSPITAL_COMMUNITY): Payer: No Typology Code available for payment source

## 2014-08-04 ENCOUNTER — Ambulatory Visit (INDEPENDENT_AMBULATORY_CARE_PROVIDER_SITE_OTHER): Payer: No Typology Code available for payment source | Admitting: Family Medicine

## 2014-08-04 ENCOUNTER — Telehealth: Payer: Self-pay

## 2014-08-04 ENCOUNTER — Other Ambulatory Visit: Payer: Self-pay | Admitting: Family Medicine

## 2014-08-04 ENCOUNTER — Encounter: Payer: Self-pay | Admitting: Family Medicine

## 2014-08-04 VITALS — BP 122/76 | HR 75 | Temp 98.4°F | Wt 238.0 lb

## 2014-08-04 DIAGNOSIS — J329 Chronic sinusitis, unspecified: Secondary | ICD-10-CM

## 2014-08-04 DIAGNOSIS — A499 Bacterial infection, unspecified: Secondary | ICD-10-CM | POA: Diagnosis not present

## 2014-08-04 DIAGNOSIS — B9689 Other specified bacterial agents as the cause of diseases classified elsewhere: Secondary | ICD-10-CM

## 2014-08-04 MED ORDER — LEVOTHYROXINE SODIUM 137 MCG PO TABS: 137.0000 ug | ORAL_TABLET | Freq: Every day | ORAL | Status: DC

## 2014-08-04 MED ORDER — AMOXICILLIN-POT CLAVULANATE 875-125 MG PO TABS: 1.0000 | ORAL_TABLET | Freq: Two times a day (BID) | ORAL | Status: DC

## 2014-08-04 NOTE — Telephone Encounter (Signed)
Medication refilled

## 2014-08-04 NOTE — Telephone Encounter (Signed)
[id number] - [place], [place] - [address]: requests 90 day supply of levothyroxine (SYNTHROID, LEVOTHROID) 137 MCG tablet

## 2014-08-04 NOTE — Progress Notes (Signed)
[name], MD  Subjective:  [name] [name] is a 59 y.o. year old very pleasant female patient who presents with:  Sinus congestion/pressure and cough -3 weeks of symptoms- starting last Sunday in May. Started with sore throat, progressed to cough, stuffy nose, sinus pressure. Stayed like that for 3 weeks. Has not taken anything for it at home. Tired with this. Coughing up clear mucus. Having some wheeze with it. Takes zyrtec. No more than usual watery, itchy eyes.   ROS- no increase from baseline chest pain or shortness of breath. No fever/chills  Past Medical History- Type II DM, chronic depression, alcoholism in remission with last drink almost 3 months ago.   Medications- reviewed and updated Current Outpatient Prescriptions  Medication Sig Dispense Refill  . acamprosate (CAMPRAL) 333 MG tablet Take 2 tablets (666 mg total) by mouth 3 (three) times daily with meals. 540 tablet 3  . B Complex Vitamins (B COMPLEX 100 PO) Take 1 tablet by mouth 2 (two) times daily.    . benazepril-hydrochlorthiazide (LOTENSIN HCT) 20-12.5 MG per tablet Take 1 tablet by mouth 2 (two) times daily. 60 tablet 5  . cetirizine (ZYRTEC) 10 MG tablet Take 10 mg by mouth as needed.     . Cholecalciferol (VITAMIN D3) 2000 UNITS TABS Take 1 tablet by mouth every morning.     . diazepam (VALIUM) 5 MG tablet     . escitalopram (LEXAPRO) 20 MG tablet Take 1 tablet (20 mg total) by mouth daily. 90 tablet 3  . glimepiride (AMARYL) 2 MG tablet Take 1 tablet (2 mg total) by mouth daily with breakfast. 90 tablet 3  . JANUMET XR 50-1000 MG TB24     . levothyroxine (SYNTHROID, LEVOTHROID) 137 MCG tablet Take 1 tablet (137 mcg total) by mouth daily before breakfast. 30 tablet 5  . sitaGLIPtin-metformin (JANUMET) 50-1000 MG per tablet Take 2 tablets by mouth every evening.    . thiamine (VITAMIN B-1) 50 MG tablet Take 1 tablet (50 mg total) by mouth every morning. 90 tablet 1  . meloxicam (MOBIC) 15 MG tablet Take 15 mg by  mouth as needed.     . mupirocin ointment (BACTROBAN) 2 % Place 1 application into the nose 2 (two) times daily as needed.    . traZODone (DESYREL) 50 MG tablet Take 1 tablet (50 mg total) by mouth at bedtime as needed and may repeat dose one time if needed for sleep. (Patient not taking: Reported on 08/04/2014) 90 tablet 3   Objective: BP 122/76 mmHg  Pulse 75  Temp(Src) 98.4 F (36.9 C)  Wt 238 lb (107.956 kg)  SpO2 96% Gen: NAD, resting comfortably HEENT: Turbinates erythematous with green discharge, TM normal, pharynx mildly erythematous with no tonsilar exudate, maxillary sinus pressure noted CV: RRR no murmurs rubs or gallops Lungs: CTAB no crackles, wheeze, rhonchi Abdomen: soft/nontender/nondistended/normal bowel sounds. No rebound or guarding.  Ext: no edema Skin: warm, dry, no rash   Assessment/Plan:  Bacterial sinusitis- based off 3 weeks of symptoms including congestion, cough, sinus pressure not improving. Augmentin for 8 days. Return precautions advised.   Meds ordered this encounter  Medications  . amoxicillin-clavulanate (AUGMENTIN) 875-125 MG per tablet    Sig: Take 1 tablet by mouth 2 (two) times daily.    Dispense:  16 tablet    Refill:  0

## 2014-08-04 NOTE — Patient Instructions (Addendum)
Bacterial sinusitis- based off 3 weeks of symptoms including sinus pressure not improving  Augmentin for 8 days.   Follow up if does not resolve by 10 days or if you have new symptoms  Your a1c was 5.8 and knee injection should be safe from a sugar perspective

## 2014-08-05 ENCOUNTER — Encounter: Payer: Self-pay | Admitting: Family Medicine

## 2014-08-05 ENCOUNTER — Other Ambulatory Visit: Payer: Self-pay

## 2014-08-05 MED ORDER — LEVOTHYROXINE SODIUM 137 MCG PO TABS: ORAL_TABLET | ORAL | Status: DC

## 2014-08-13 ENCOUNTER — Other Ambulatory Visit (HOSPITAL_COMMUNITY): Payer: No Typology Code available for payment source

## 2014-08-22 ENCOUNTER — Encounter: Payer: Self-pay | Admitting: Family Medicine

## 2014-08-22 ENCOUNTER — Ambulatory Visit (INDEPENDENT_AMBULATORY_CARE_PROVIDER_SITE_OTHER): Payer: No Typology Code available for payment source | Admitting: Family Medicine

## 2014-08-22 VITALS — BP 112/56 | HR 79 | Temp 98.9°F | Wt 236.0 lb

## 2014-08-22 DIAGNOSIS — R0981 Nasal congestion: Secondary | ICD-10-CM

## 2014-08-22 DIAGNOSIS — R05 Cough: Secondary | ICD-10-CM | POA: Diagnosis not present

## 2014-08-22 DIAGNOSIS — R058 Other specified cough: Secondary | ICD-10-CM

## 2014-08-22 DIAGNOSIS — J3489 Other specified disorders of nose and nasal sinuses: Secondary | ICD-10-CM | POA: Diagnosis not present

## 2014-08-22 NOTE — Patient Instructions (Signed)
Let's trial zyrtec nightly every night along with flonase daily for next week.   I will call in a 2 week course of levaquin if not at least 50% better

## 2014-08-22 NOTE — Progress Notes (Signed)
[name], MD  Subjective:  [name] [name] is a 59 y.o. year old very pleasant female patient who presents with:  Cough, congestion, sinus pressure Seen  A little over 2 weeks ago with cough, congestion, sinus pressure for 3 weeks. Presumed bacterial sinusitis treated with Augmentin 8 days. Cough improved but just went from productive to dry. Nasal congestion better, but still with post nasal drainage. From this has Mild nausea when waking up and going to bed. sinus pressure seems to come and go and, remains tired. Intermittently, Some watery, itchy eyes.   ROS- no fever, chills,  vomiting  Past Medical History- history alcohol abuse, depression, DM II, HTn, HLD, hypothyroid  Medications- reviewed and updated Current Outpatient Prescriptions  Medication Sig Dispense Refill  . acamprosate (CAMPRAL) 333 MG tablet Take 2 tablets (666 mg total) by mouth 3 (three) times daily with meals. 540 tablet 3  . B Complex Vitamins (B COMPLEX 100 PO) Take 1 tablet by mouth 2 (two) times daily.    . benazepril-hydrochlorthiazide (LOTENSIN HCT) 20-12.5 MG per tablet Take 1 tablet by mouth 2 (two) times daily. 60 tablet 5  . cetirizine (ZYRTEC) 10 MG tablet Take 10 mg by mouth as needed.     . Cholecalciferol (VITAMIN D3) 2000 UNITS TABS Take 1 tablet by mouth every morning.     . diazepam (VALIUM) 5 MG tablet     . escitalopram (LEXAPRO) 20 MG tablet Take 1 tablet (20 mg total) by mouth daily. 90 tablet 3  . glimepiride (AMARYL) 2 MG tablet Take 1 tablet (2 mg total) by mouth daily with breakfast. 90 tablet 3  . JANUMET XR 50-1000 MG TB24     . levothyroxine (SYNTHROID, LEVOTHROID) 137 MCG tablet TAKE 1 TABLET BY MOUTH EVERY DAY BEFORE BREAKFAST 90 tablet 2  . meloxicam (MOBIC) 15 MG tablet Take 15 mg by mouth as needed.     . mupirocin ointment (BACTROBAN) 2 % Place 1 application into the nose 2 (two) times daily as needed.    . sitaGLIPtin-metformin (JANUMET) 50-1000 MG per tablet Take 2 tablets by  mouth every evening.    . thiamine (VITAMIN B-1) 50 MG tablet Take 1 tablet (50 mg total) by mouth every morning. 90 tablet 1  . traZODone (DESYREL) 50 MG tablet Take 1 tablet (50 mg total) by mouth at bedtime as needed and may repeat dose one time if needed for sleep. (Patient not taking: Reported on 08/04/2014) 90 tablet 3   Objective: BP 112/56 mmHg  Pulse 79  Temp(Src) 98.9 F (37.2 C)  Wt 236 lb (107.049 kg)  SpO2 96% Gen: NAD, resting comfortably HEENT:  Right Turbinate erythematous with green discharge and some dried blood, left clear except for erythema, TM normal, pharynx mildly erythematous with no tonsilar exudate, maxillary sinus pressure resolved compared to prior CV: RRR no murmurs rubs or gallops Lungs: CTAB no crackles, wheeze, rhonchi Abdomen: soft/nontender/nondistended/normal bowel sounds. No rebound or guarding.  Ext: no edema Skin: warm, dry, no rash   Assessment/Plan:  Dry Cough, congestion and drainage, sinus pressure (comes and goes) She is not taking zyrtec daily and advised to take this daily in addition to flonase. Her symptoms are more mild than previous but persistence concerning. Could be allergic rhinitis contributing so start by maximizing therapy. If no improvement by next Thursday or Friday, treat with levaquin 500mg  x 14 days as already had 8 day course augmentin. ENT refer if persists at that point.   I doubt this is  related to benazepril but have to consider if unable to clear dry cough  Return precautions advised.

## 2014-08-23 ENCOUNTER — Encounter: Payer: Self-pay | Admitting: Family Medicine

## 2014-08-26 NOTE — Telephone Encounter (Signed)
[name] [name],  We had discussed "I will call in a 2 week course of levaquin if not at least 50% better" and we had verbally discussed by this Thursday or Friday- the 7th or 8th. If you have not made that much progress, please message me and I will call it in. I did not call it in already and had not planned to unless you didn't improve on the zyrtec/flonase combination for a week.  [name]

## 2014-08-27 ENCOUNTER — Encounter: Payer: Self-pay | Admitting: Family Medicine

## 2014-09-03 ENCOUNTER — Other Ambulatory Visit (HOSPITAL_COMMUNITY): Payer: No Typology Code available for payment source

## 2014-09-17 ENCOUNTER — Encounter: Payer: Self-pay | Admitting: Gastroenterology

## 2014-09-24 ENCOUNTER — Other Ambulatory Visit (HOSPITAL_COMMUNITY): Payer: No Typology Code available for payment source

## 2014-09-25 ENCOUNTER — Ambulatory Visit: Payer: Self-pay | Admitting: Cardiovascular Disease

## 2014-10-02 ENCOUNTER — Ambulatory Visit: Payer: Self-pay | Admitting: Family Medicine

## 2014-10-02 ENCOUNTER — Other Ambulatory Visit: Payer: Self-pay

## 2014-10-06 ENCOUNTER — Other Ambulatory Visit (INDEPENDENT_AMBULATORY_CARE_PROVIDER_SITE_OTHER): Payer: No Typology Code available for payment source

## 2014-10-06 DIAGNOSIS — E785 Hyperlipidemia, unspecified: Secondary | ICD-10-CM

## 2014-10-06 DIAGNOSIS — I10 Essential (primary) hypertension: Secondary | ICD-10-CM

## 2014-10-06 DIAGNOSIS — E119 Type 2 diabetes mellitus without complications: Secondary | ICD-10-CM

## 2014-10-06 LAB — COMPREHENSIVE METABOLIC PANEL
ALT: 23 U/L (ref 0–35)
AST: 27 U/L (ref 0–37)
Albumin: 3.7 g/dL (ref 3.5–5.2)
Alkaline Phosphatase: 81 U/L (ref 39–117)
BUN: 12 mg/dL (ref 6–23)
CO2: 27 mEq/L (ref 19–32)
Calcium: 10.4 mg/dL (ref 8.4–10.5)
Chloride: 96 mEq/L (ref 96–112)
Creatinine, Ser: 1.31 mg/dL — ABNORMAL HIGH (ref 0.40–1.20)
GFR: 44 mL/min — ABNORMAL LOW (ref >60.00)
Glucose, Bld: 325 mg/dL — ABNORMAL HIGH (ref 70–99)
Potassium: 4.4 mEq/L (ref 3.5–5.1)
Sodium: 136 mEq/L (ref 135–145)
Total Bilirubin: 1.7 mg/dL — ABNORMAL HIGH (ref 0.2–1.2)
Total Protein: 6.9 g/dL (ref 6.0–8.3)

## 2014-10-06 LAB — CBC
HCT: 42 % (ref 36.0–46.0)
Hemoglobin: 14.7 g/dL (ref 12.0–15.0)
MCHC: 34.9 g/dL (ref 30.0–36.0)
MCV: 97.5 fl (ref 78.0–100.0)
Platelets: 175 10*3/uL (ref 150.0–400.0)
RBC: 4.3 Mil/uL (ref 3.87–5.11)
RDW: 13.4 % (ref 11.5–15.5)
WBC: 8 10*3/uL (ref 4.0–10.5)

## 2014-10-06 LAB — LIPID PANEL
Cholesterol: 129 mg/dL (ref 0–200)
HDL: 38.1 mg/dL — ABNORMAL LOW (ref >39.00)
LDL Cholesterol: 65 mg/dL (ref 0–99)
NonHDL: 90.58
Total CHOL/HDL Ratio: 3
Triglycerides: 130 mg/dL (ref 0.0–149.0)
VLDL: 26 mg/dL (ref 0.0–40.0)

## 2014-10-06 LAB — TSH: TSH: 0.09 u[IU]/mL — ABNORMAL LOW (ref 0.35–4.50)

## 2014-10-06 LAB — HEMOGLOBIN A1C: Hgb A1c MFr Bld: 12 % — ABNORMAL HIGH (ref 4.6–6.5)

## 2014-10-08 LAB — HM DIABETES EYE EXAM

## 2014-10-09 ENCOUNTER — Encounter: Payer: Self-pay | Admitting: Family Medicine

## 2014-10-09 ENCOUNTER — Ambulatory Visit (INDEPENDENT_AMBULATORY_CARE_PROVIDER_SITE_OTHER): Payer: No Typology Code available for payment source | Admitting: Family Medicine

## 2014-10-09 VITALS — BP 118/66 | HR 83 | Temp 99.1°F | Wt 228.0 lb

## 2014-10-09 DIAGNOSIS — E039 Hypothyroidism, unspecified: Secondary | ICD-10-CM

## 2014-10-09 DIAGNOSIS — Z23 Encounter for immunization: Secondary | ICD-10-CM

## 2014-10-09 DIAGNOSIS — E1165 Type 2 diabetes mellitus with hyperglycemia: Secondary | ICD-10-CM

## 2014-10-09 MED ORDER — ACCU-CHEK FASTCLIX LANCETS MISC: Status: DC

## 2014-10-09 MED ORDER — GLIMEPIRIDE 4 MG PO TABS: 4.0000 mg | ORAL_TABLET | Freq: Every day | ORAL | Status: DC

## 2014-10-09 MED ORDER — SITAGLIP PHOS-METFORMIN HCL ER 50-1000 MG PO TB24: 2.0000 | ORAL_TABLET | Freq: Every day | ORAL | Status: DC

## 2014-10-09 MED ORDER — LEVOTHYROXINE SODIUM 125 MCG PO TABS: ORAL_TABLET | ORAL | Status: DC

## 2014-10-09 MED ORDER — GLUCOSE BLOOD VI STRP: ORAL_STRIP | Status: DC

## 2014-10-09 NOTE — Progress Notes (Signed)
[name], MD  Subjective:  [name] [name] is a 59 y.o. year old very pleasant female patient who presents with:  See problem oriented charting ROS- Blurry vision on vacation, when returned home improved as diet improved some, saw optho- no concerns last week. No chest pain shortnes of breath. Denies hypoglycemia. No hair or nail changes. No heat/cold intolerance. No constipation or diarrhea. Denies shakiness or anxiety.  Past Medical History- alcohol abuse (abstient), hypothyroid, HLD< HTN  Medications- reviewed and updated Current Outpatient Prescriptions  Medication Sig Dispense Refill  . acamprosate (CAMPRAL) 333 MG tablet Take 2 tablets (666 mg total) by mouth 3 (three) times daily with meals. 540 tablet 3  . B Complex Vitamins (B COMPLEX 100 PO) Take 1 tablet by mouth 2 (two) times daily.    . benazepril-hydrochlorthiazide (LOTENSIN HCT) 20-12.5 MG per tablet Take 1 tablet by mouth 2 (two) times daily. 60 tablet 5  . cetirizine (ZYRTEC) 10 MG tablet Take 10 mg by mouth as needed.     . Cholecalciferol (VITAMIN D3) 2000 UNITS TABS Take 1 tablet by mouth every morning.     . escitalopram (LEXAPRO) 20 MG tablet Take 1 tablet (20 mg total) by mouth daily. 90 tablet 3  . glimepiride (AMARYL) 2 MG tablet Take 1 tablet (2 mg total) by mouth daily with breakfast. 90 tablet 3  . JANUMET XR 50-1000 MG TB24     . levothyroxine (SYNTHROID, LEVOTHROID) 137 MCG tablet TAKE 1 TABLET BY MOUTH EVERY DAY BEFORE BREAKFAST 90 tablet 2  . mupirocin ointment (BACTROBAN) 2 % Place 1 application into the nose 2 (two) times daily as needed.    . sitaGLIPtin-metformin (JANUMET) 50-1000 MG per tablet Take 2 tablets by mouth every evening.    . thiamine (VITAMIN B-1) 50 MG tablet Take 1 tablet (50 mg total) by mouth every morning. 90 tablet 1  . diazepam (VALIUM) 5 MG tablet     . meloxicam (MOBIC) 15 MG tablet Take 15 mg by mouth as needed.     . traZODone (DESYREL) 50 MG tablet Take 1 tablet (50 mg  total) by mouth at bedtime as needed and may repeat dose one time if needed for sleep. (Patient not taking: Reported on 10/09/2014) 90 tablet 3   No current facility-administered medications for this visit.    Objective: BP 118/66 mmHg  Pulse 83  Temp(Src) 99.1 F (37.3 C)  Wt 228 lb (103.42 kg) Gen: NAD, resting comfortably Dry mucus membranes CV: RRR no murmurs rubs or gallops Lungs: CTAB no crackles, wheeze, rhonchi Abdomen: soft/nontender/nondistended/normal bowel sounds. No rebound or guarding. obese Ext: no edema Skin: warm, dry, no rash Neuro: grossly normal, moves all extremities   Assessment/Plan:  Type II diabetes mellitus, well controlled S: very poorly controlled. States has had a diet for 4-5 months of soda, cake, gobstoppers. Eating very poorly, not exercising A/P:increase amaryl to 4mg  and continue janumet XL at max dose. I doubt this will be enough but her A1c did swing form 6 to 12 in 3 months so suspect may have significant swing back if controls food intake. Discussed 4-6 week follow up and if fastings not under 200 by that time, consider insulin. Patient really wants to consider glp-1 agonist- concerned this may not get her to goal unless already around 8.    Hypothyroid S: remains overtreated with TSH suppressed A/P: decrease levothyroxine to 125 mcg from 137 mcg.    4-6 week follow up. Given new meter to monitor at home.  Orders Placed This Encounter  Procedures  . Varicella-zoster vaccine subcutaneous    Meds ordered this encounter  Medications  . glucose blood (ACCU-CHEK COMPACT STRIPS) test strip    Sig: Use to test blood sugars daily. Dx: E11.9    Dispense:  100 each    Refill:  12  . ACCU-CHEK FASTCLIX LANCETS MISC    Sig: Use to test blood sugars daily. Dx: E11.9    Dispense:  100 each    Refill:  11  . levothyroxine (SYNTHROID, LEVOTHROID) 125 MCG tablet    Sig: TAKE 1 TABLET BY MOUTH EVERY DAY BEFORE BREAKFAST    Dispense:  90 tablet     Refill:  1  . glimepiride (AMARYL) 4 MG tablet    Sig: Take 1 tablet (4 mg total) by mouth daily with breakfast.    Dispense:  90 tablet    Refill:  1  . SitaGLIPtin-MetFORMIN HCl (JANUMET XR) 50-1000 MG TB24    Sig: Take 2 tablets by mouth daily.    Dispense:  180 tablet    Refill:  1

## 2014-10-09 NOTE — Assessment & Plan Note (Signed)
S: very poorly controlled. States has had a diet for 4-5 months of soda, cake, gobstoppers. Eating very poorly, not exercising A/P:increase amaryl to 4mg  and continue janumet XL at max dose. I doubt this will be enough but her A1c did swing form 6 to 12 in 3 months so suspect may have significant swing back if controls food intake. Discussed 4-6 week follow up and if fastings not under 200 by that time, consider insulin. Patient really wants to consider glp-1 agonist- concerned this may not get her to goal unless already around 8.

## 2014-10-09 NOTE — Patient Instructions (Addendum)
Medication Instructions:  Increase amaryl to 4mg  (sent in) Decrease levothyroxine to 125 mcg  Other Instructions:  Have to cut the sugars out. It is going to take some drastic changes to avoid insulin  Log blood sugar each morning. If we cannot get it under 300 within 2 weeks, strongly need to consider insulin  Testing/Procedures/Immunizations: Received shingles shot today.  Flu shot when you see me next month  Follow-Up (all visit scheduling, rescheduling, cancellations including labs should be scheduled at front desk): See me in 4-6 weeks  Sooner if new or worsening symptoms

## 2014-10-09 NOTE — Assessment & Plan Note (Signed)
S: remains overtreated with TSH suppressed A/P: decrease levothyroxine to 125 mcg from 137 mcg.

## 2014-10-12 ENCOUNTER — Encounter: Payer: Self-pay | Admitting: Family Medicine

## 2014-10-15 ENCOUNTER — Other Ambulatory Visit (HOSPITAL_COMMUNITY): Payer: No Typology Code available for payment source

## 2014-10-30 ENCOUNTER — Ambulatory Visit: Payer: Self-pay | Admitting: Cardiovascular Disease

## 2014-11-05 ENCOUNTER — Other Ambulatory Visit (HOSPITAL_COMMUNITY): Payer: No Typology Code available for payment source

## 2014-11-06 ENCOUNTER — Encounter: Payer: Self-pay | Admitting: Family Medicine

## 2014-11-06 ENCOUNTER — Ambulatory Visit (INDEPENDENT_AMBULATORY_CARE_PROVIDER_SITE_OTHER): Payer: No Typology Code available for payment source | Admitting: Family Medicine

## 2014-11-06 VITALS — BP 120/72 | HR 78 | Temp 98.0°F | Wt 230.0 lb

## 2014-11-06 DIAGNOSIS — Z23 Encounter for immunization: Secondary | ICD-10-CM | POA: Diagnosis not present

## 2014-11-06 DIAGNOSIS — E039 Hypothyroidism, unspecified: Secondary | ICD-10-CM

## 2014-11-06 DIAGNOSIS — Z20828 Contact with and (suspected) exposure to other viral communicable diseases: Secondary | ICD-10-CM

## 2014-11-06 DIAGNOSIS — E119 Type 2 diabetes mellitus without complications: Secondary | ICD-10-CM

## 2014-11-06 NOTE — Progress Notes (Signed)
[name], MD  Subjective:  [name] [name] is a 59 y.o. year old very pleasant female patient who presents for/with See problem oriented charting Also updates me Seen ENT- on ear drops-antibiotics and has follow up Seen optho- good report Synvisc injections for knees Also almost 6 months alcohol free ROS-  Blurry vision has resolved, no hypoglyemia, no chest pain or shortness of breath  Past Medical History-  Patient Active Problem List   Diagnosis Date Noted  . Alcoholic fatty liver 16/38/4536    Priority: High  . Type II diabetes mellitus, well controlled 06/23/2010    Priority: High  . Alcohol use disorder, severe, dependence 06/23/2010    Priority: High  . Chronic depression 06/23/2010    Priority: High  . Hypothyroid 07/21/2010    Priority: Medium  . HTN (hypertension) 06/23/2010    Priority: Medium  . Hyperlipidemia 06/23/2010    Priority: Medium  . Former smoker 07/10/2014    Priority: Low  . Osteoarthritis 05/16/2014    Priority: Low  . Chronic post-traumatic stress disorder (PTSD) 05/02/2014    Priority: Low  . Sleep disorder 05/02/2014    Priority: Low  . Family history of alcoholism 05/02/2014    Priority: Low  . Macrocytosis 04/19/2014    Priority: Low  . Fatigue 08/15/2013    Priority: Low  . Thyroid nodule 09/17/2012    Priority: Low  . Obesity (BMI 30-39.9) 06/23/2010    Priority: Low  . History of colonic polyps 04/12/2010    Priority: Low  . Exertional shortness of breath 06/07/2014  . Chest pain 06/07/2014    Medications- reviewed and updated Current Outpatient Prescriptions  Medication Sig Dispense Refill  . acamprosate (CAMPRAL) 333 MG tablet Take 2 tablets (666 mg total) by mouth 3 (three) times daily with meals. 540 tablet 3  . ACCU-CHEK FASTCLIX LANCETS MISC Use to test blood sugars daily. Dx: E11.9 100 each 11  . B Complex Vitamins (B COMPLEX 100 PO) Take 1 tablet by mouth 2 (two) times daily.    . benazepril-hydrochlorthiazide  (LOTENSIN HCT) 20-12.5 MG per tablet Take 1 tablet by mouth 2 (two) times daily. 60 tablet 5  . Cholecalciferol (VITAMIN D3) 2000 UNITS TABS Take 1 tablet by mouth every morning.     . escitalopram (LEXAPRO) 20 MG tablet Take 1 tablet (20 mg total) by mouth daily. 90 tablet 3  . glimepiride (AMARYL) 4 MG tablet Take 1 tablet (4 mg total) by mouth daily with breakfast. 90 tablet 1  . glucose blood (ACCU-CHEK COMPACT STRIPS) test strip Use to test blood sugars daily. Dx: E11.9 100 each 12  . levothyroxine (SYNTHROID, LEVOTHROID) 125 MCG tablet TAKE 1 TABLET BY MOUTH EVERY DAY BEFORE BREAKFAST 90 tablet 1  . SitaGLIPtin-MetFORMIN HCl (JANUMET XR) 50-1000 MG TB24 Take 2 tablets by mouth daily. 180 tablet 1  . thiamine (VITAMIN B-1) 50 MG tablet Take 1 tablet (50 mg total) by mouth every morning. 90 tablet 1  . traZODone (DESYREL) 50 MG tablet Take 1 tablet (50 mg total) by mouth at bedtime as needed and may repeat dose one time if needed for sleep. (Patient not taking: Reported on 10/09/2014) 90 tablet 3   No current facility-administered medications for this visit.    Objective: BP 120/72 mmHg  Pulse 78  Temp(Src) 98 F (36.7 C)  Wt 230 lb (104.327 kg)  SpO2 94% Gen: NAD, resting comfortably CV: RRR no murmurs rubs or gallops Lungs: CTAB no crackles, wheeze, rhonchi Abdomen: soft/nontender/nondistended/normal bowel  sounds. No rebound or guarding. obese Ext: no edema Skin: warm, dry Neuro: grossly normal, moves all extremities  Assessment/Plan:  Type II diabetes mellitus, well controlled S: 5 week follow up after a1c skyrocketed from 5.8 to 12. Patient was eating very poor diet. We increased amaryl to 4 mg but left janumet alone- patient wanted to trial without insulin. Fasting sugars over last 2 weeks from 83-196 but over last week down to 83-139 after starting last visit above 200s and at times above 300s fasting A/P: Drastic improvement in sugars, we will get repeat a1c in about 2 more  months then patien tto return. Hopeful <8 or 9. If at least in this range could consider glp-1 agonist as patient was interested in last time- perhaps trulicity. We also verified she had strips available at her pharmacy which she does.    Return precautions advised. See avs  Future labs Orders Placed This Encounter  Procedures  . Flu Vaccine QUAD 36+ mos IM  . Hemoglobin A1c    [place]    Standing Status: Future     Number of Occurrences:      Standing Expiration Date: 11/06/2015  . Microalbumin / creatinine urine ratio    [place]    Standing Status: Future     Number of Occurrences:      Standing Expiration Date: 11/06/2015  . Comprehensive metabolic panel    [place]    Standing Status: Future     Number of Occurrences:      Standing Expiration Date: 11/06/2015  . TSH    Standing Status: Future     Number of Occurrences:      Standing Expiration Date: 11/06/2015  . HIV antibody    [facility]    Standing Status: Future     Number of Occurrences:      Standing Expiration Date: 11/06/2015

## 2014-11-06 NOTE — Patient Instructions (Addendum)
Received flu shot today.  Get labs sometime after 11/07/14 then see me within a week  Great job getting the diabetes back on the right track  Schedule a physical for 6 months  Test strips are at pharmacy

## 2014-11-07 NOTE — Assessment & Plan Note (Signed)
S: 5 week follow up after a1c skyrocketed from 5.8 to 12. Patient was eating very poor diet. We increased amaryl to 4 mg but left janumet alone- patient wanted to trial without insulin. Fasting sugars over last 2 weeks from 83-196 but over last week down to 83-139 after starting last visit above 200s and at times above 300s fasting A/P: Drastic improvement in sugars, we will get repeat a1c in about 2 more months then patien tto return. Hopeful <8 or 9. If at least in this range could consider glp-1 agonist as patient was interested in last time- perhaps trulicity. We also verified she had strips available at her pharmacy which she does.

## 2014-11-10 LAB — HM DIABETES EYE EXAM

## 2014-11-11 ENCOUNTER — Encounter: Payer: Self-pay | Admitting: Family Medicine

## 2014-11-26 ENCOUNTER — Other Ambulatory Visit (HOSPITAL_COMMUNITY): Payer: No Typology Code available for payment source

## 2014-12-02 ENCOUNTER — Other Ambulatory Visit: Payer: Self-pay | Admitting: Family Medicine

## 2014-12-17 ENCOUNTER — Other Ambulatory Visit (HOSPITAL_COMMUNITY): Payer: No Typology Code available for payment source

## 2014-12-23 ENCOUNTER — Encounter: Payer: Self-pay | Admitting: Family Medicine

## 2015-01-07 ENCOUNTER — Other Ambulatory Visit (HOSPITAL_COMMUNITY): Payer: No Typology Code available for payment source

## 2015-01-08 ENCOUNTER — Other Ambulatory Visit (INDEPENDENT_AMBULATORY_CARE_PROVIDER_SITE_OTHER): Payer: No Typology Code available for payment source

## 2015-01-08 DIAGNOSIS — E039 Hypothyroidism, unspecified: Secondary | ICD-10-CM | POA: Diagnosis not present

## 2015-01-08 DIAGNOSIS — Z20828 Contact with and (suspected) exposure to other viral communicable diseases: Secondary | ICD-10-CM

## 2015-01-08 DIAGNOSIS — E119 Type 2 diabetes mellitus without complications: Secondary | ICD-10-CM

## 2015-01-08 LAB — COMPREHENSIVE METABOLIC PANEL
ALT: 17 U/L (ref 0–35)
AST: 21 U/L (ref 0–37)
Albumin: 3.8 g/dL (ref 3.5–5.2)
Alkaline Phosphatase: 78 U/L (ref 39–117)
BUN: 22 mg/dL (ref 6–23)
CO2: 24 mEq/L (ref 19–32)
Calcium: 10.6 mg/dL — ABNORMAL HIGH (ref 8.4–10.5)
Chloride: 105 mEq/L (ref 96–112)
Creatinine, Ser: 1.03 mg/dL (ref 0.40–1.20)
GFR: 58.02 mL/min — ABNORMAL LOW (ref >60.00)
Glucose, Bld: 109 mg/dL — ABNORMAL HIGH (ref 70–99)
Potassium: 5.2 mEq/L — ABNORMAL HIGH (ref 3.5–5.1)
Sodium: 142 mEq/L (ref 135–145)
Total Bilirubin: 1.4 mg/dL — ABNORMAL HIGH (ref 0.2–1.2)
Total Protein: 6.9 g/dL (ref 6.0–8.3)

## 2015-01-08 LAB — HEMOGLOBIN A1C: Hgb A1c MFr Bld: 5.8 % (ref 4.6–6.5)

## 2015-01-08 LAB — MICROALBUMIN / CREATININE URINE RATIO
Creatinine,U: 131.2 mg/dL
Microalb Creat Ratio: 2.7 mg/g (ref 0.0–30.0)
Microalb, Ur: 3.6 mg/dL — ABNORMAL HIGH (ref 0.0–1.9)

## 2015-01-08 LAB — TSH: TSH: 0.08 u[IU]/mL — ABNORMAL LOW (ref 0.35–4.50)

## 2015-01-09 LAB — HIV ANTIBODY (ROUTINE TESTING W REFLEX): HIV 1&2 Ab, 4th Generation: NONREACTIVE

## 2015-01-14 ENCOUNTER — Encounter: Payer: Self-pay | Admitting: Family Medicine

## 2015-01-14 ENCOUNTER — Ambulatory Visit (INDEPENDENT_AMBULATORY_CARE_PROVIDER_SITE_OTHER): Payer: No Typology Code available for payment source | Admitting: Family Medicine

## 2015-01-14 VITALS — BP 122/64 | HR 74 | Temp 98.1°F | Wt 238.0 lb

## 2015-01-14 DIAGNOSIS — E039 Hypothyroidism, unspecified: Secondary | ICD-10-CM

## 2015-01-14 DIAGNOSIS — I10 Essential (primary) hypertension: Secondary | ICD-10-CM

## 2015-01-14 DIAGNOSIS — E119 Type 2 diabetes mellitus without complications: Secondary | ICD-10-CM

## 2015-01-14 MED ORDER — BENAZEPRIL HCL 20 MG PO TABS: 20.0000 mg | ORAL_TABLET | Freq: Every day | ORAL | Status: DC

## 2015-01-14 MED ORDER — LEVOTHYROXINE SODIUM 112 MCG PO TABS: ORAL_TABLET | ORAL | Status: DC

## 2015-01-14 NOTE — Assessment & Plan Note (Signed)
S: drastically improved controlled. On sitagliptin-metformin 50-1000mg  XL nightly, amaryl 4 mg daily CBGs- 70-130 fasting. Signed up for trial through [facility] and diet- Some desserts but not 24/7 sugar as when a1c was 12.  Lab Results  Component Value Date   HGBA1C 5.8 01/08/2015   HGBA1C 12.0* 10/06/2014   HGBA1C 5.8 06/17/2014   A/P: decrease amaryl to 2mg  given excellent control and some sugars into 60s.

## 2015-01-14 NOTE — Progress Notes (Signed)
[name], MD  Subjective:  [name] [name] is a 59 y.o. year old very pleasant female patient who presents for/with See problem oriented charting ROS- No chest pain or shortness of breath. No headache or blurry vision. No alcohol intake  Past Medical History-  Patient Active Problem List   Diagnosis Date Noted  . Alcoholic fatty liver Q000111Q    Priority: High  . Type II diabetes mellitus, well controlled ([place]) 06/23/2010    Priority: High  . Alcohol use disorder, severe, dependence ([place]) 06/23/2010    Priority: High  . Chronic depression 06/23/2010    Priority: High  . Hypothyroid 07/21/2010    Priority: Medium  . HTN (hypertension) 06/23/2010    Priority: Medium  . Hyperlipidemia 06/23/2010    Priority: Medium  . Former smoker 07/10/2014    Priority: Low  . Osteoarthritis 05/16/2014    Priority: Low  . Chronic post-traumatic stress disorder (PTSD) 05/02/2014    Priority: Low  . Sleep disorder 05/02/2014    Priority: Low  . Family history of alcoholism 05/02/2014    Priority: Low  . Macrocytosis 04/19/2014    Priority: Low  . Fatigue 08/15/2013    Priority: Low  . Thyroid nodule 09/17/2012    Priority: Low  . Obesity (BMI 30-39.9) 06/23/2010    Priority: Low  . History of colonic polyps 04/12/2010    Priority: Low  . Exertional shortness of breath 06/07/2014  . Chest pain 06/07/2014    Medications- reviewed and updated Current Outpatient Prescriptions  Medication Sig Dispense Refill  . acamprosate (CAMPRAL) 333 MG tablet Take 2 tablets (666 mg total) by mouth 3 (three) times daily with meals. 540 tablet 3  . ACCU-CHEK FASTCLIX LANCETS MISC Use to test blood sugars daily. Dx: E11.9 100 each 11  . B Complex Vitamins (B COMPLEX 100 PO) Take 1 tablet by mouth 2 (two) times daily.    . Cholecalciferol (VITAMIN D3) 2000 UNITS TABS Take 1 tablet by mouth every morning.     . escitalopram (LEXAPRO) 20 MG tablet Take 1 tablet (20 mg total) by mouth daily. 90  tablet 3  . glimepiride (AMARYL) 4 MG tablet Take 1 tablet (4 mg total) by mouth daily with breakfast. 90 tablet 1  . glucose blood (ACCU-CHEK COMPACT STRIPS) test strip Use to test blood sugars daily. Dx: E11.9 100 each 12  . levothyroxine (SYNTHROID, LEVOTHROID) 112 MCG tablet TAKE 1 TABLET BY MOUTH EVERY DAY BEFORE BREAKFAST 90 tablet 3  . SitaGLIPtin-MetFORMIN HCl (JANUMET XR) 50-1000 MG TB24 Take 2 tablets by mouth daily. 180 tablet 1  . thiamine (VITAMIN B-1) 50 MG tablet Take 1 tablet (50 mg total) by mouth every morning. 90 tablet 1  . benazepril (LOTENSIN) 20 MG tablet Take 1 tablet (20 mg total) by mouth daily. 90 tablet 3  . traZODone (DESYREL) 50 MG tablet Take 1 tablet (50 mg total) by mouth at bedtime as needed and may repeat dose one time if needed for sleep. (Patient not taking: Reported on 10/09/2014) 90 tablet 3   No current facility-administered medications for this visit.    Objective: BP 122/64 mmHg  Pulse 74  Temp(Src) 98.1 F (36.7 C)  Wt 238 lb (107.956 kg) Gen: NAD, resting comfortably CV: RRR no murmurs rubs or gallops Lungs: CTAB no crackles, wheeze, rhonchi Abdomen: soft/nontender/nondistended/normal bowel sounds. No rebound or guarding. 3 fingerbreadth liver hepatomegaly Ext: no edema Skin: warm, dry Neuro: grossly normal, moves all extremities  Assessment/Plan:  Type II diabetes mellitus,  well controlled S: drastically improved controlled. On sitagliptin-metformin 50-1000mg  XL nightly, amaryl 4 mg daily CBGs- 70-130 fasting. Signed up for trial through [facility] and diet- Some desserts but not 24/7 sugar as when a1c was 12.  Lab Results  Component Value Date   HGBA1C 5.8 01/08/2015   HGBA1C 12.0* 10/06/2014   HGBA1C 5.8 06/17/2014   A/P: decrease amaryl to 2mg  given excellent control and some sugars into 60s.        HTN (hypertension) S: controlled. On benazepril-hctz 20-12.5mg   BP Readings from Last 3 Encounters:  01/14/15 122/64   11/06/14 120/72  10/09/14 118/66  A/P: stop combo pill with hctz due to mild hypercalcemia. Filled benazepril 20mg  only with bmet in [redacted] weeks along with TSH, suspect potassium was lab error   Hypothyroid S: over treated with Levothyroxine 137mcg Lab Results  Component Value Date   TSH 0.08* 01/08/2015  A/P: decrease to 112 mcg and follow up 6 weeks.    March CPE Labs 6 weeks though Return precautions advised.   Orders Placed This Encounter  Procedures  . TSH    Standing Status: Future     Number of Occurrences:      Standing Expiration Date: 01/14/2016  . Basic metabolic panel    Standing Status: Future     Number of Occurrences:      Standing Expiration Date: 01/14/2016    Meds ordered this encounter  Medications  . benazepril (LOTENSIN) 20 MG tablet    Sig: Take 1 tablet (20 mg total) by mouth daily.    Dispense:  90 tablet    Refill:  3  . levothyroxine (SYNTHROID, LEVOTHROID) 112 MCG tablet    Sig: TAKE 1 TABLET BY MOUTH EVERY DAY BEFORE BREAKFAST    Dispense:  90 tablet    Refill:  3

## 2015-01-14 NOTE — Patient Instructions (Signed)
Schedule next available physical at front desk (suspect this is going to be about 3 months out at least). You can do labs before visit if you would like.   Stop benazepril-hctz pill and change to benazepril alone due to high calcium in blood  Change to 112 mcg of levothyroxine for thyroid  Follow up for labs in 6-7 weeks- schedule visit at front desk  congrats on 8 month chip!

## 2015-01-14 NOTE — Assessment & Plan Note (Signed)
S: over treated with Levothyroxine 122mcg Lab Results  Component Value Date   TSH 0.08* 01/08/2015  A/P: decrease to 112 mcg and follow up 6 weeks.

## 2015-01-14 NOTE — Assessment & Plan Note (Signed)
S: controlled. On benazepril-hctz 20-12.5mg   BP Readings from Last 3 Encounters:  01/14/15 122/64  11/06/14 120/72  10/09/14 118/66  A/P: stop combo pill with hctz due to mild hypercalcemia. Filled benazepril 20mg  only with bmet in [redacted] weeks along with TSH, suspect potassium was lab error

## 2015-01-28 ENCOUNTER — Other Ambulatory Visit (HOSPITAL_COMMUNITY): Payer: No Typology Code available for payment source

## 2015-01-29 ENCOUNTER — Telehealth: Payer: Self-pay | Admitting: Family Medicine

## 2015-01-29 NOTE — Telephone Encounter (Signed)
See below Pt states she is wanting her LEXAPRO increased b/c she has been depressed more over the past two weeks. She feels kind of down and she is not sure if it is due to the season she has been going to grief therapy and not sure if it is related to that.

## 2015-01-29 NOTE — Telephone Encounter (Signed)
She is on max dose of lexapro at 20mg . There are other options such as change or add on therapy but would want to discuss in person. Please make sure no suicidal ideation. I can see her at 8 am tomorrow morning if she would like.

## 2015-01-29 NOTE — Telephone Encounter (Signed)
Mrs. Harvest Dark please put pt on Dr. Yong Channel schedule for 8am tomorrow morning. 01/30/15

## 2015-01-29 NOTE — Telephone Encounter (Signed)
Pt call to ask if Dr Yong Channel will increase her escitalopram (LEXAPRO) 20 MG tablet   [phone]

## 2015-01-30 ENCOUNTER — Ambulatory Visit (INDEPENDENT_AMBULATORY_CARE_PROVIDER_SITE_OTHER): Payer: No Typology Code available for payment source | Admitting: Family Medicine

## 2015-01-30 ENCOUNTER — Encounter: Payer: Self-pay | Admitting: Family Medicine

## 2015-01-30 VITALS — BP 122/60 | Temp 98.6°F | Wt 242.0 lb

## 2015-01-30 DIAGNOSIS — F329 Major depressive disorder, single episode, unspecified: Secondary | ICD-10-CM | POA: Diagnosis not present

## 2015-01-30 DIAGNOSIS — F32A Depression, unspecified: Secondary | ICD-10-CM

## 2015-01-30 MED ORDER — BUPROPION HCL ER (XL) 150 MG PO TB24: 150.0000 mg | ORAL_TABLET | Freq: Every day | ORAL | Status: DC

## 2015-01-30 NOTE — Patient Instructions (Signed)
Add wellbutrin extended release See me in about 6 weeks or sooner if you have worsening symptoms.  Call us immediately if any thoughts of hurting yourself

## 2015-01-30 NOTE — Assessment & Plan Note (Signed)
S: poorly controlled with PHQ9 of 13 on lexapro 20mg . This season is particularly hard for her. Going to grief counseling dealing with loss of mother. Formerly cared for by psychiatry.  A/P: add wellbutrin 150 mg XL, follow up in 6 weeks (discussed if 100% better may push out to march follow up)

## 2015-01-30 NOTE — Progress Notes (Signed)
[name], MD  Subjective:  [name] [name] is a 59 y.o. year old very pleasant female patient who presents for/with See problem oriented charting ROS- No SI/HI. Denies anxiety as one of her issues. No chest pain or shortness of breath  Past Medical History-  Patient Active Problem List   Diagnosis Date Noted  . Alcoholic fatty liver Q000111Q    Priority: High  . Type II diabetes mellitus, well controlled ([place]) 06/23/2010    Priority: High  . Alcohol use disorder, severe, dependence ([place]) 06/23/2010    Priority: High  . Chronic depression 06/23/2010    Priority: High  . Hypothyroid 07/21/2010    Priority: Medium  . HTN (hypertension) 06/23/2010    Priority: Medium  . Hyperlipidemia 06/23/2010    Priority: Medium  . Former smoker 07/10/2014    Priority: Low  . Osteoarthritis 05/16/2014    Priority: Low  . Chronic post-traumatic stress disorder (PTSD) 05/02/2014    Priority: Low  . Sleep disorder 05/02/2014    Priority: Low  . Family history of alcoholism 05/02/2014    Priority: Low  . Macrocytosis 04/19/2014    Priority: Low  . Fatigue 08/15/2013    Priority: Low  . Thyroid nodule 09/17/2012    Priority: Low  . Obesity (BMI 30-39.9) 06/23/2010    Priority: Low  . History of colonic polyps 04/12/2010    Priority: Low  . Exertional shortness of breath 06/07/2014  . Chest pain 06/07/2014    Medications- reviewed and updated Current Outpatient Prescriptions  Medication Sig Dispense Refill  . acamprosate (CAMPRAL) 333 MG tablet Take 2 tablets (666 mg total) by mouth 3 (three) times daily with meals. 540 tablet 3  . ACCU-CHEK FASTCLIX LANCETS MISC Use to test blood sugars daily. Dx: E11.9 100 each 11  . B Complex Vitamins (B COMPLEX 100 PO) Take 1 tablet by mouth 2 (two) times daily.    . benazepril (LOTENSIN) 20 MG tablet Take 1 tablet (20 mg total) by mouth daily. 90 tablet 3  . Cholecalciferol (VITAMIN D3) 2000 UNITS TABS Take 1 tablet by mouth every morning.      . escitalopram (LEXAPRO) 20 MG tablet Take 1 tablet (20 mg total) by mouth daily. 90 tablet 3  . glimepiride (AMARYL) 4 MG tablet Take 1 tablet (4 mg total) by mouth daily with breakfast. 90 tablet 1  . glucose blood (ACCU-CHEK COMPACT STRIPS) test strip Use to test blood sugars daily. Dx: E11.9 100 each 12  . levothyroxine (SYNTHROID, LEVOTHROID) 112 MCG tablet TAKE 1 TABLET BY MOUTH EVERY DAY BEFORE BREAKFAST 90 tablet 3  . SitaGLIPtin-MetFORMIN HCl (JANUMET XR) 50-1000 MG TB24 Take 2 tablets by mouth daily. 180 tablet 1  . thiamine (VITAMIN B-1) 50 MG tablet Take 1 tablet (50 mg total) by mouth every morning. 90 tablet 1  . buPROPion (WELLBUTRIN XL) 150 MG 24 hr tablet Take 1 tablet (150 mg total) by mouth daily. 30 tablet 5  . traZODone (DESYREL) 50 MG tablet Take 1 tablet (50 mg total) by mouth at bedtime as needed and may repeat dose one time if needed for sleep. (Patient not taking: Reported on 10/09/2014) 90 tablet 3   No current facility-administered medications for this visit.    Objective: BP 122/60 mmHg  Temp(Src) 98.6 F (37 C)  Wt 242 lb (109.77 kg) Gen: NAD, resting comfortably Psych: tearful at moments when talking about her mother  Assessment/Plan:  Chronic depression S: poorly controlled with PHQ9 of 13 on lexapro 20mg .  This season is particularly hard for her. Going to grief counseling dealing with loss of mother. Formerly cared for by psychiatry.  A/P: add wellbutrin 150 mg XL, follow up in 6 weeks (discussed if 100% better may push out to march follow up)   Emergent Return precautions advised in regards to SI  Meds ordered this encounter  Medications  . buPROPion (WELLBUTRIN XL) 150 MG 24 hr tablet    Sig: Take 1 tablet (150 mg total) by mouth daily.    Dispense:  30 tablet    Refill:  5   >50% of 20 minute office visit was spent on counseling (in regards to depression) and coordination of care

## 2015-02-18 ENCOUNTER — Telehealth: Payer: Self-pay | Admitting: Family Medicine

## 2015-02-18 ENCOUNTER — Other Ambulatory Visit (HOSPITAL_COMMUNITY): Payer: No Typology Code available for payment source

## 2015-02-18 NOTE — Telephone Encounter (Signed)
Patient called stating that the buPROPion (WELLBUTRIN XL) 150 MG 24 hr tablet worked fine for the first week. Then around the second week patient started feeling tired. Patient wanted to know if the dosage needs to be changed.

## 2015-02-18 NOTE — Telephone Encounter (Signed)
See below

## 2015-02-19 MED ORDER — BUPROPION HCL ER (XL) 300 MG PO TB24
300.0000 mg | ORAL_TABLET | Freq: Every day | ORAL | Status: DC
Start: 2015-02-19 — End: 2015-03-07

## 2015-02-19 NOTE — Telephone Encounter (Signed)
Lm for pt tcb. 

## 2015-02-19 NOTE — Telephone Encounter (Signed)
That is about the lowest dose we could do. We are able to go up to 300mg  but not sure if that will correct the issue.

## 2015-02-19 NOTE — Telephone Encounter (Signed)
Pt returned call and wanted to try the Wellbutrin 300mg  to see how this would work for her. Wellbutrin sent in for pt.

## 2015-03-07 ENCOUNTER — Other Ambulatory Visit: Payer: Self-pay | Admitting: Family Medicine

## 2015-03-08 ENCOUNTER — Encounter: Payer: Self-pay | Admitting: Family Medicine

## 2015-03-11 ENCOUNTER — Other Ambulatory Visit (HOSPITAL_COMMUNITY): Payer: No Typology Code available for payment source

## 2015-03-13 ENCOUNTER — Encounter: Payer: Self-pay | Admitting: Family Medicine

## 2015-03-13 ENCOUNTER — Ambulatory Visit (INDEPENDENT_AMBULATORY_CARE_PROVIDER_SITE_OTHER): Payer: No Typology Code available for payment source | Admitting: Family Medicine

## 2015-03-13 VITALS — BP 128/60 | HR 77 | Temp 98.7°F | Wt 241.0 lb

## 2015-03-13 DIAGNOSIS — E785 Hyperlipidemia, unspecified: Secondary | ICD-10-CM

## 2015-03-13 DIAGNOSIS — E039 Hypothyroidism, unspecified: Secondary | ICD-10-CM

## 2015-03-13 DIAGNOSIS — E119 Type 2 diabetes mellitus without complications: Secondary | ICD-10-CM | POA: Diagnosis not present

## 2015-03-13 DIAGNOSIS — Z23 Encounter for immunization: Secondary | ICD-10-CM | POA: Diagnosis not present

## 2015-03-13 DIAGNOSIS — I10 Essential (primary) hypertension: Secondary | ICD-10-CM

## 2015-03-13 DIAGNOSIS — F32A Depression, unspecified: Secondary | ICD-10-CM

## 2015-03-13 DIAGNOSIS — F329 Major depressive disorder, single episode, unspecified: Secondary | ICD-10-CM

## 2015-03-13 LAB — COMPREHENSIVE METABOLIC PANEL
ALT: 17 U/L (ref 0–35)
AST: 21 U/L (ref 0–37)
Albumin: 3.8 g/dL (ref 3.5–5.2)
Alkaline Phosphatase: 72 U/L (ref 39–117)
BUN: 16 mg/dL (ref 6–23)
CO2: 29 mEq/L (ref 19–32)
Calcium: 10.2 mg/dL (ref 8.4–10.5)
Chloride: 105 mEq/L (ref 96–112)
Creatinine, Ser: 0.96 mg/dL (ref 0.40–1.20)
GFR: 62.89 mL/min (ref >60.00)
Glucose, Bld: 244 mg/dL — ABNORMAL HIGH (ref 70–99)
Potassium: 4.6 mEq/L (ref 3.5–5.1)
Sodium: 142 mEq/L (ref 135–145)
Total Bilirubin: 0.8 mg/dL (ref 0.2–1.2)
Total Protein: 6.3 g/dL (ref 6.0–8.3)

## 2015-03-13 LAB — CBC
HCT: 40 % (ref 36.0–46.0)
Hemoglobin: 13.5 g/dL (ref 12.0–15.0)
MCHC: 33.8 g/dL (ref 30.0–36.0)
MCV: 96.4 fl (ref 78.0–100.0)
Platelets: 174 10*3/uL (ref 150.0–400.0)
RBC: 4.15 Mil/uL (ref 3.87–5.11)
RDW: 13.3 % (ref 11.5–15.5)
WBC: 6.1 10*3/uL (ref 4.0–10.5)

## 2015-03-13 LAB — TSH: TSH: 0.29 u[IU]/mL — ABNORMAL LOW (ref 0.35–4.50)

## 2015-03-13 MED ORDER — BUPROPION HCL ER (XL) 300 MG PO TB24: 300.0000 mg | ORAL_TABLET | Freq: Every day | ORAL | Status: DC

## 2015-03-13 NOTE — Patient Instructions (Addendum)
Stop amaryl to help avoid low blood sugars. Please let me know if you continue to have anything under 70  Labs before you go. Make sure thyroid normal before signing surgery forms- we have a little more time to adjust if needed.   Congrats on 10 months!

## 2015-03-13 NOTE — Assessment & Plan Note (Signed)
S: controlled. On benazepril 20mg   BP Readings from Last 3 Encounters:  03/13/15 128/60  01/30/15 122/60  01/14/15 122/64  A/P:Continue current medications

## 2015-03-13 NOTE — Assessment & Plan Note (Signed)
Depression is controlled. No SI. Continue lexapro 23m, wellbutrin 300, trazodone for sleep. Congratulated on getting 10 month chip for AA this upcoming Monday.

## 2015-03-13 NOTE — Assessment & Plan Note (Signed)
S: previously overtreated with levothyroxine 133mcg Lab Results  Component Value Date   TSH 0.08* 01/08/2015  A/P: compliant with 112 mcg and asymptomatic- check TSH today

## 2015-03-13 NOTE — Assessment & Plan Note (Signed)
S: overly controlled. On sitagliptin-metformin 50-1000mg  XL nightly, amaryl 4 mg daily CBGs- 100-120 most mornings. Has had about one a week under 70 and wakes her up sweating Lab Results  Component Value Date   HGBA1C 5.8 01/08/2015   HGBA1C 12.0* 10/06/2014   HGBA1C 5.8 06/17/2014   A/P: continue sitaglipitin- metformin. Stop amaryl due to hypoglycemia with a1c planned at physical. To call if she continues to have lows

## 2015-03-13 NOTE — Progress Notes (Signed)
[name], MD  Subjective:  [name] [name] is a 60 y.o. year old very pleasant female patient who presents for/with See problem oriented charting ROS- No chest pain or shortness of breath. No headache or blurry vision.   Past Medical History-  Patient Active Problem List   Diagnosis Date Noted  . Alcoholic fatty liver Q000111Q    Priority: High  . Type II diabetes mellitus, well controlled ([place]) 06/23/2010    Priority: High  . Alcohol use disorder, severe, dependence ([place]) 06/23/2010    Priority: High  . Chronic depression 06/23/2010    Priority: High  . Hypothyroid 07/21/2010    Priority: Medium  . HTN (hypertension) 06/23/2010    Priority: Medium  . Hyperlipidemia 06/23/2010    Priority: Medium  . Former smoker 07/10/2014    Priority: Low  . Osteoarthritis 05/16/2014    Priority: Low  . Chronic post-traumatic stress disorder (PTSD) 05/02/2014    Priority: Low  . Sleep disorder 05/02/2014    Priority: Low  . Family history of alcoholism 05/02/2014    Priority: Low  . Macrocytosis 04/19/2014    Priority: Low  . Fatigue 08/15/2013    Priority: Low  . Thyroid nodule 09/17/2012    Priority: Low  . Obesity (BMI 30-39.9) 06/23/2010    Priority: Low  . History of colonic polyps 04/12/2010    Priority: Low  . Exertional shortness of breath 06/07/2014  . Chest pain 06/07/2014    Medications- reviewed and updated Current Outpatient Prescriptions  Medication Sig Dispense Refill  . acamprosate (CAMPRAL) 333 MG tablet Take 2 tablets (666 mg total) by mouth 3 (three) times daily with meals. 540 tablet 3  . ACCU-CHEK FASTCLIX LANCETS MISC Use to test blood sugars daily. Dx: E11.9 100 each 11  . B Complex Vitamins (B COMPLEX 100 PO) Take 1 tablet by mouth 2 (two) times daily.    . benazepril (LOTENSIN) 20 MG tablet Take 1 tablet (20 mg total) by mouth daily. 90 tablet 3  . buPROPion (WELLBUTRIN XL) 300 MG 24 hr tablet Take 1 tablet (300 mg total) by mouth daily. 90  tablet 3  . Cholecalciferol (VITAMIN D3) 2000 UNITS TABS Take 1 tablet by mouth every morning.     . escitalopram (LEXAPRO) 20 MG tablet Take 1 tablet (20 mg total) by mouth daily. 90 tablet 3  . glucose blood (ACCU-CHEK COMPACT STRIPS) test strip Use to test blood sugars daily. Dx: E11.9 100 each 12  . levothyroxine (SYNTHROID, LEVOTHROID) 112 MCG tablet TAKE 1 TABLET BY MOUTH EVERY DAY BEFORE BREAKFAST 90 tablet 3  . SitaGLIPtin-MetFORMIN HCl (JANUMET XR) 50-1000 MG TB24 Take 2 tablets by mouth daily. 180 tablet 1  . thiamine (VITAMIN B-1) 50 MG tablet Take 1 tablet (50 mg total) by mouth every morning. 90 tablet 1  . traZODone (DESYREL) 50 MG tablet Take 1 tablet (50 mg total) by mouth at bedtime as needed and may repeat dose one time if needed for sleep. (Patient not taking: Reported on 10/09/2014) 90 tablet 3   No current facility-administered medications for this visit.    Objective: BP 128/60 mmHg  Pulse 77  Temp(Src) 98.7 F (37.1 C)  Wt 241 lb (109.317 kg) Gen: NAD, resting comfortably CV: RRR no murmurs rubs or gallops Lungs: CTAB no crackles, wheeze, rhonchi Abdomen: soft/nontender/nondistended/normal bowel sounds. No rebound or guarding. Obese Ext: no edema Skin: warm, dry Neuro: grossly normal, moves all extremities   Assessment/Plan:  Preoperative evaluation- as stated below disease states  are stable and medically maximized for surgery (pending normal TSH) with exception of cholesterol but would not want her back on this with prior LFT elevations unless LDL severely elevated. She had a stress test last June which was normal under Dr. Claiborne Billings. She has had no chest pain or shortness of breath in interval. Will sign medical maximization forms as long as TSH comes back normal.   Type II diabetes mellitus, well controlled ([place]) S: overly controlled. On sitagliptin-metformin 50-1000mg  XL nightly, amaryl 4 mg daily CBGs- 100-120 most mornings. Has had about one a week under 70  and wakes her up sweating Lab Results  Component Value Date   HGBA1C 5.8 01/08/2015   HGBA1C 12.0* 10/06/2014   HGBA1C 5.8 06/17/2014   A/P: continue sitaglipitin- metformin. Stop amaryl due to hypoglycemia with a1c planned at physical. To call if she continues to have lows   Chronic depression Depression is controlled. No SI. Continue lexapro 21m, wellbutrin 300, trazodone for sleep. Congratulated on getting 10 month chip for AA this upcoming Monday.   Hypothyroid S: previously overtreated with levothyroxine 133mcg Lab Results  Component Value Date   TSH 0.08* 01/08/2015  A/P: compliant with 112 mcg and asymptomatic- check TSH today   HTN (hypertension) S: controlled. On benazepril 20mg   BP Readings from Last 3 Encounters:  03/13/15 128/60  01/30/15 122/60  01/14/15 122/64  A/P:Continue current medications   Hyperlipidemia S: poorly controlled on no statin- off due to alcoholic LFT elevations. No myalgias.  Lab Results  Component Value Date   CHOL 129 10/06/2014   HDL 38.10* 10/06/2014   LDLCALC 65 10/06/2014   LDLDIRECT 158.6 02/26/2014   TRIG 130.0 10/06/2014   CHOLHDL 3 10/06/2014   A/P: I think with LFT elevations in best interest to remain off statin despite upcoming surgery. Has physical before surgery and I would say if > 160, consider short term statin     Return in about 3 months (around 06/11/2015). CPE Return precautions advised.   Orders Placed This Encounter  Procedures  . CBC    [place]  . Comprehensive metabolic panel    [place]  . TSH        Meds ordered this encounter  Medications  . buPROPion (WELLBUTRIN XL) 300 MG 24 hr tablet    Sig: Take 1 tablet (300 mg total) by mouth daily.    Dispense:  90 tablet    Refill:  3

## 2015-03-13 NOTE — Assessment & Plan Note (Signed)
S: poorly controlled on no statin- off due to alcoholic LFT elevations. No myalgias.  Lab Results  Component Value Date   CHOL 129 10/06/2014   HDL 38.10* 10/06/2014   LDLCALC 65 10/06/2014   LDLDIRECT 158.6 02/26/2014   TRIG 130.0 10/06/2014   CHOLHDL 3 10/06/2014   A/P: I think with LFT elevations in best interest to remain off statin despite upcoming surgery. Has physical before surgery and I would say if > 160, consider short term statin

## 2015-03-13 NOTE — Addendum Note (Signed)
Addended by: Clyde Lundborg A on: 03/13/2015 03:55 PM   Modules accepted: Orders

## 2015-03-16 ENCOUNTER — Other Ambulatory Visit: Payer: Self-pay

## 2015-03-16 MED ORDER — LEVOTHYROXINE SODIUM 100 MCG PO TABS: 100.0000 ug | ORAL_TABLET | Freq: Every day | ORAL | Status: DC

## 2015-03-18 ENCOUNTER — Encounter: Payer: Self-pay | Admitting: Family Medicine

## 2015-03-27 ENCOUNTER — Encounter: Payer: Self-pay | Admitting: Family Medicine

## 2015-04-01 ENCOUNTER — Other Ambulatory Visit (HOSPITAL_COMMUNITY): Payer: No Typology Code available for payment source

## 2015-04-02 NOTE — Telephone Encounter (Signed)
Is she on his DPR? This message would need to come through his mychart or be logged in his chart regardless.

## 2015-04-19 ENCOUNTER — Other Ambulatory Visit: Payer: Self-pay | Admitting: Family Medicine

## 2015-04-22 ENCOUNTER — Other Ambulatory Visit (HOSPITAL_COMMUNITY): Payer: No Typology Code available for payment source

## 2015-04-24 ENCOUNTER — Other Ambulatory Visit (INDEPENDENT_AMBULATORY_CARE_PROVIDER_SITE_OTHER): Payer: No Typology Code available for payment source

## 2015-04-24 DIAGNOSIS — Z0000 Encounter for general adult medical examination without abnormal findings: Secondary | ICD-10-CM

## 2015-04-24 DIAGNOSIS — E039 Hypothyroidism, unspecified: Secondary | ICD-10-CM

## 2015-04-24 DIAGNOSIS — I10 Essential (primary) hypertension: Secondary | ICD-10-CM

## 2015-04-24 LAB — HEPATIC FUNCTION PANEL
ALT: 17 U/L (ref 0–35)
AST: 20 U/L (ref 0–37)
Albumin: 4.1 g/dL (ref 3.5–5.2)
Alkaline Phosphatase: 58 U/L (ref 39–117)
Bilirubin, Direct: 0.3 mg/dL (ref 0.0–0.3)
Total Bilirubin: 1.4 mg/dL — ABNORMAL HIGH (ref 0.2–1.2)
Total Protein: 6.5 g/dL (ref 6.0–8.3)

## 2015-04-24 LAB — CBC WITH DIFFERENTIAL/PLATELET
Basophils Absolute: 0 10*3/uL (ref 0.0–0.1)
Basophils Relative: 0.3 % (ref 0.0–3.0)
Eosinophils Absolute: 0 10*3/uL (ref 0.0–0.7)
Eosinophils Relative: 0.7 % (ref 0.0–5.0)
HCT: 41.2 % (ref 36.0–46.0)
Hemoglobin: 14.3 g/dL (ref 12.0–15.0)
Lymphocytes Relative: 35.8 % (ref 12.0–46.0)
Lymphs Abs: 2.6 10*3/uL (ref 0.7–4.0)
MCHC: 34.6 g/dL (ref 30.0–36.0)
MCV: 95.6 fl (ref 78.0–100.0)
Monocytes Absolute: 0.7 10*3/uL (ref 0.1–1.0)
Monocytes Relative: 9.3 % (ref 3.0–12.0)
Neutro Abs: 4 10*3/uL (ref 1.4–7.7)
Neutrophils Relative %: 53.9 % (ref 43.0–77.0)
Platelets: 188 10*3/uL (ref 150.0–400.0)
RBC: 4.31 Mil/uL (ref 3.87–5.11)
RDW: 13.7 % (ref 11.5–15.5)
WBC: 7.4 10*3/uL (ref 4.0–10.5)

## 2015-04-24 LAB — LIPID PANEL
Cholesterol: 173 mg/dL (ref 0–200)
HDL: 64.1 mg/dL (ref >39.00)
LDL Cholesterol: 95 mg/dL (ref 0–99)
NonHDL: 108.43
Total CHOL/HDL Ratio: 3
Triglycerides: 65 mg/dL (ref 0.0–149.0)
VLDL: 13 mg/dL (ref 0.0–40.0)

## 2015-04-24 LAB — BASIC METABOLIC PANEL
BUN: 20 mg/dL (ref 6–23)
CO2: 25 mEq/L (ref 19–32)
Calcium: 10.1 mg/dL (ref 8.4–10.5)
Chloride: 105 mEq/L (ref 96–112)
Creatinine, Ser: 1 mg/dL (ref 0.40–1.20)
GFR: 59.97 mL/min — ABNORMAL LOW (ref >60.00)
Glucose, Bld: 115 mg/dL — ABNORMAL HIGH (ref 70–99)
Potassium: 4.4 mEq/L (ref 3.5–5.1)
Sodium: 140 mEq/L (ref 135–145)

## 2015-04-24 LAB — POC URINALSYSI DIPSTICK (AUTOMATED)
Bilirubin, UA: NEGATIVE
Blood, UA: NEGATIVE
Glucose, UA: NEGATIVE
Ketones, UA: NEGATIVE
Nitrite, UA: NEGATIVE
Protein, UA: NEGATIVE
Spec Grav, UA: >=1.03
Urobilinogen, UA: 1
pH, UA: 6

## 2015-04-24 LAB — TSH: TSH: 0.18 u[IU]/mL — ABNORMAL LOW (ref 0.35–4.50)

## 2015-04-24 LAB — HEMOGLOBIN A1C: Hgb A1c MFr Bld: 5.4 % (ref 4.6–6.5)

## 2015-04-26 NOTE — H&P (Signed)
TOTAL KNEE ADMISSION H&P  Patient is being admitted for left total knee arthroplasty.  Subjective:  Chief Complaint:    Left knee primary OA / pain  HPI: [name] [name], 60 y.o. female, has a history of pain and functional disability in the left knee due to arthritis and has failed non-surgical conservative treatments for greater than 12 weeks to include NSAID's and/or analgesics, corticosteriod injections, viscosupplementation injections and activity modification.  Onset of symptoms was gradual, starting 1+ years ago with gradually worsening course since that time. The patient noted prior procedures on the knee to include  arthroplasty on the right knee(s).  Patient currently rates pain in the left knee(s) at 4 out of 10 with activity, more complaining about instability. Patient has night pain, worsening of pain with activity and weight bearing, pain that interferes with activities of daily living, pain with passive range of motion, crepitus and joint swelling.  Patient has evidence of periarticular osteophytes and joint space narrowing by imaging studies.  There is no active infection.   Risks, benefits and expectations were discussed with the patient.  Risks including but not limited to the risk of anesthesia, blood clots, nerve damage, blood vessel damage, failure of the prosthesis, infection and up to and including death.  Patient understand the risks, benefits and expectations and wishes to proceed with surgery.   PCP: [name], MD  D/C Plans:      Home  Post-op Meds:       No Rx given  Tranexamic Acid:      To be given - IV   Decadron:      Is to be given  FYI:     ASA  Norco  Aquacel OK     Patient Active Problem List   Diagnosis Date Noted  . Former smoker 07/10/2014  . Exertional shortness of breath 06/07/2014  . Chest pain 06/07/2014  . Osteoarthritis 05/16/2014  . Alcoholic fatty liver Q000111Q  . Chronic post-traumatic stress disorder (PTSD) 05/02/2014  . Sleep  disorder 05/02/2014  . Family history of alcoholism 05/02/2014  . Macrocytosis 04/19/2014  . Fatigue 08/15/2013  . Thyroid nodule 09/17/2012  . Hypothyroid 07/21/2010  . Type II diabetes mellitus, well controlled ([place]) 06/23/2010  . HTN (hypertension) 06/23/2010  . Alcohol use disorder, severe, dependence ([place]) 06/23/2010  . Hyperlipidemia 06/23/2010  . Obesity (BMI 30-39.9) 06/23/2010  . Chronic depression 06/23/2010  . History of colonic polyps 04/12/2010   Past Medical History  Diagnosis Date  . Depression   . GERD (gastroesophageal reflux disease)   . Hypertension   . Hyperlipidemia   . Colon polyps   . Thyroid disease   . History of recurrent UTIs   . Anemia     menstrual related  . Arthritis     right ankle  . Diabetes mellitus 12/2009    type 2  . Alcohol problem drinking     rehab  . Acute meniscal tear of knee     Left knee  . Heart murmur   . Shortness of breath     with exertion   . Hypothyroidism   . Anxiety   . Evalluate for OSA (obstructive sleep apnea) 08/15/2013    No OSA on sleep study but may be underestimated.    . Diabetes mellitus, type II ([place])   . Liver disease   . 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  Total knee arthroplasty  2009    right  . Cesarean section  1987  . Cholecystectomy    . Tubal ligation  1987  . Tonsillectomy    . Adenoidectomy    . Right rotator cuff surgery     . Right ankle surgery       x 2  . Total knee revision Right 12/16/2013    Procedure: RIGHT TOTAL KNEE REVISION POLY EXCHANGE;  Surgeon: Mauri Pole, MD;  Location: [facility];  Service: Orthopedics;  Laterality: Right;  . Mass excision Left 12/16/2013    Procedure: EXCISION LEFT  DISTAL THIGH MASS;  Surgeon: Mauri Pole, MD;  Location: [facility];  Service: Orthopedics;  Laterality: Left;    No prescriptions prior to admission   Allergies  Allergen Reactions  .  Adhesive [Tape] Other (See Comments)    blisters  . Other Other (See Comments)    (Neoprene)--causes blisters.     Social History  Substance Use Topics  . Smoking status: Former Smoker -- 1.50 packs/day for 30 years    Types: Cigarettes    Quit date: 02/21/2009  . Smokeless tobacco: Never Used  . Alcohol Use: 14.4 oz/week    14 Cans of beer, 10 Glasses of wine per week     Comment: quit January 2015 started in august 2015 , quit again 2016    Family History  Problem Relation Age of Onset  . Adopted: Yes  . COPD Mother   . Alcohol abuse Father      Review of Systems  Constitutional: Positive for malaise/fatigue.  HENT: Negative.   Eyes: Negative.   Respiratory: Positive for shortness of breath (on exertion).   Cardiovascular: Negative.   Gastrointestinal: Positive for heartburn.  Genitourinary: Negative.   Musculoskeletal: Positive for joint pain.  Skin: Negative.   Neurological: Negative.   Endo/Heme/Allergies: Negative.   Psychiatric/Behavioral: Positive for depression. The patient is nervous/anxious.     Objective:  Physical Exam  Constitutional: She is oriented to person, place, and time. She appears well-developed.  HENT:  Head: Normocephalic.  Eyes: Pupils are equal, round, and reactive to light.  Neck: Neck supple. No JVD present. No tracheal deviation present. No thyromegaly present.  Cardiovascular: Normal rate, regular rhythm and intact distal pulses.   Murmur heard. Respiratory: Effort normal and breath sounds normal. No stridor. No respiratory distress. She has no wheezes.  GI: Soft. There is no tenderness. There is no guarding.  Musculoskeletal:       Left knee: She exhibits decreased range of motion, swelling and bony tenderness. She exhibits no ecchymosis, no deformity, no laceration and no erythema. Tenderness found.  Lymphadenopathy:    She has no cervical adenopathy.  Neurological: She is alert and oriented to person, place, and time. A sensory  deficit (bilateral LE DM neuropathy) is present.  Skin: Skin is warm and dry.  Psychiatric: She has a normal mood and affect.      Labs:  Estimated body mass index is 39.08 kg/(m^2) as calculated from the following:   Height as of 06/05/14: 5\' 6"  (1.676 m).   Weight as of 01/30/15: 109.77 kg (242 lb).   Imaging Review Plain radiographs demonstrate severe degenerative joint disease of the left knee(s).  The bone quality appears to be good for age and reported activity level.  Assessment/Plan:  End stage arthritis, left knee   The patient history, physical examination, clinical judgment of the provider and imaging studies are consistent with end stage degenerative joint  disease of the left knee(s) and total knee arthroplasty is deemed medically necessary. The treatment options including medical management, injection therapy arthroscopy and arthroplasty were discussed at length. The risks and benefits of total knee arthroplasty were presented and reviewed. The risks due to aseptic loosening, infection, stiffness, patella tracking problems, thromboembolic complications and other imponderables were discussed. The patient acknowledged the explanation, agreed to proceed with the plan and consent was signed. Patient is being admitted for inpatient treatment for surgery, pain control, PT, OT, prophylactic antibiotics, VTE prophylaxis, progressive ambulation and ADL's and discharge planning. The patient is planning to be discharged home with home health services.     [name] Bradie Lacock   PA-C  04/26/2015, 8:50 PM

## 2015-04-27 ENCOUNTER — Encounter (HOSPITAL_COMMUNITY)
Admission: RE | Admit: 2015-04-27 | Discharge: 2015-04-27 | Disposition: A | Discharge status: Home / Self Care | Payer: No Typology Code available for payment source | Source: Ambulatory Visit | Attending: Orthopedic Surgery | Admitting: Orthopedic Surgery

## 2015-04-27 ENCOUNTER — Encounter (HOSPITAL_COMMUNITY): Payer: Self-pay

## 2015-04-27 DIAGNOSIS — Z1272 Encounter for screening for malignant neoplasm of vagina: Secondary | ICD-10-CM | POA: Diagnosis not present

## 2015-04-27 DIAGNOSIS — Z01419 Encounter for gynecological examination (general) (routine) without abnormal findings: Secondary | ICD-10-CM | POA: Diagnosis present

## 2015-04-27 DIAGNOSIS — Z01812 Encounter for preprocedural laboratory examination: Secondary | ICD-10-CM | POA: Diagnosis not present

## 2015-04-27 HISTORY — DX: Myoneural disorder, unspecified: G70.9

## 2015-04-27 LAB — SURGICAL PCR SCREEN
MRSA, PCR: NEGATIVE
Staphylococcus aureus: NEGATIVE

## 2015-04-27 LAB — APTT: aPTT: 45 seconds — ABNORMAL HIGH (ref 24–37)

## 2015-04-27 LAB — PROTIME-INR
INR: 1.15 (ref 0.00–1.49)
Prothrombin Time: 14.9 seconds (ref 11.6–15.2)

## 2015-04-27 NOTE — Patient Instructions (Signed)
[name] [name]  04/27/2015   Your procedure is scheduled on: 05-05-15  Report to [facility]  Entrance take [facility]  elevators to 3rd floor to  [place] at  [place]  AM.  Call this number if you have problems the morning of surgery [phone]   Remember: ONLY 1 PERSON MAY GO WITH YOU TO SHORT STAY TO GET  READY MORNING OF [place].  Do not eat food or drink liquids :After Midnight .Bring Living Will/ Health care Power of Attorney papers.     Take these medicines the morning of surgery with A SIP OF WATER:  Bupropion. Lexapro. Levothyroxine. Campral. DO NOT TAKE ANY DIABETIC MEDICATIONS DAY OF YOUR SURGERY                               You may not have any metal on your body including hair pins and              piercings  Do not wear jewelry, make-up, lotions, powders or perfumes, deodorant             Do not wear nail polish.  Do not shave  48 hours prior to surgery.              Men may shave face and neck.   Do not bring valuables to the hospital. [place].  Contacts, dentures or bridgework may not be worn into surgery.  Leave suitcase in the car. After surgery it may be brought to your room.     Patients discharged the day of surgery will not be allowed to drive home.  Name and phone number of your driver:Don- spouse [phone] cell  Special Instructions: N/A              Please read over the following fact sheets you were given: _____________________________________________________________________             [facility] - Preparing for Surgery Before surgery, you can play an important role.  Because skin is not sterile, your skin needs to be as free of germs as possible.  You can reduce the number of germs on your skin by washing with CHG (chlorahexidine gluconate) soap before surgery.  CHG is an antiseptic cleaner which kills germs and bonds with the skin to continue killing germs even  after washing. Please DO NOT use if you have an allergy to CHG or antibacterial soaps.  If your skin becomes reddened/irritated stop using the CHG and inform your nurse when you arrive at Short Stay. Do not shave (including legs and underarms) for at least 48 hours prior to the first CHG shower.  You may shave your face/neck. Please follow these instructions carefully:  1.  Shower with CHG Soap the night before surgery and the  morning of Surgery.  2.  If you choose to wash your hair, wash your hair first as usual with your  normal  shampoo.  3.  After you shampoo, rinse your hair and body thoroughly to remove the  shampoo.                           4.  Use CHG as you would any  other liquid soap.  You can apply chg directly  to the skin and wash                       Gently with a scrungie or clean washcloth.  5.  Apply the CHG Soap to your body ONLY FROM THE NECK DOWN.   Do not use on face/ open                           Wound or open sores. Avoid contact with eyes, ears mouth and genitals (private parts).                       Wash face,  Genitals (private parts) with your normal soap.             6.  Wash thoroughly, paying special attention to the area where your surgery  will be performed.  7.  Thoroughly rinse your body with warm water from the neck down.  8.  DO NOT shower/wash with your normal soap after using and rinsing off  the CHG Soap.                9.  Pat yourself dry with a clean towel.            10.  Wear clean pajamas.            11.  Place clean sheets on your bed the night of your first shower and do not  sleep with pets. Day of Surgery : Do not apply any lotions/deodorants the morning of surgery.  Please wear clean clothes to the hospital/surgery center.  FAILURE TO FOLLOW THESE INSTRUCTIONS MAY RESULT IN THE CANCELLATION OF YOUR SURGERY PATIENT SIGNATURE_________________________________  NURSE  SIGNATURE__________________________________  ________________________________________________________________________   Adam Phenix  An incentive spirometer is a tool that can help keep your lungs clear and active. This tool measures how well you are filling your lungs with each breath. Taking long deep breaths may help reverse or decrease the chance of developing breathing (pulmonary) problems (especially infection) following:  A long period of time when you are unable to move or be active. BEFORE THE PROCEDURE   If the spirometer includes an indicator to show your best effort, your nurse or respiratory therapist will set it to a desired goal.  If possible, sit up straight or lean slightly forward. Try not to slouch.  Hold the incentive spirometer in an upright position. INSTRUCTIONS FOR USE   Sit on the edge of your bed if possible, or sit up as far as you can in bed or on a chair.  Hold the incentive spirometer in an upright position.  Breathe out normally.  Place the mouthpiece in your mouth and seal your lips tightly around it.  Breathe in slowly and as deeply as possible, raising the piston or the ball toward the top of the column.  Hold your breath for 3-5 seconds or for as long as possible. Allow the piston or ball to fall to the bottom of the column.  Remove the mouthpiece from your mouth and breathe out normally.  Rest for a few seconds and repeat Steps 1 through 7 at least 10 times every 1-2 hours when you are awake. Take your time and take a few normal breaths between deep breaths.  The spirometer may include an indicator to show your best effort. Use the indicator as a  goal to work toward during each repetition.  After each set of 10 deep breaths, practice coughing to be sure your lungs are clear. If you have an incision (the cut made at the time of surgery), support your incision when coughing by placing a pillow or rolled up towels firmly against it. Once  you are able to get out of bed, walk around indoors and cough well. You may stop using the incentive spirometer when instructed by your caregiver.  RISKS AND COMPLICATIONS  Take your time so you do not get dizzy or light-headed.  If you are in pain, you may need to take or ask for pain medication before doing incentive spirometry. It is harder to take a deep breath if you are having pain. AFTER USE  Rest and breathe slowly and easily.  It can be helpful to keep track of a log of your progress. Your caregiver can provide you with a simple table to help with this. If you are using the spirometer at home, follow these instructions: [place] IF:   You are having difficultly using the spirometer.  You have trouble using the spirometer as often as instructed.  Your pain medication is not giving enough relief while using the spirometer.  You develop fever of 100.5 F (38.1 C) or higher. SEEK IMMEDIATE MEDICAL CARE IF:   You cough up bloody sputum that had not been present before.  You develop fever of 102 F (38.9 C) or greater.  You develop worsening pain at or near the incision site. MAKE SURE YOU:   Understand these instructions.  Will watch your condition.  Will get help right away if you are not doing well or get worse. Document Released: 06/20/2006 Document Revised: 05/02/2011 Document Reviewed: 08/21/2006 ExitCare Patient Information 2014 ExitCare, [place].   ________________________________________________________________________  WHAT IS A BLOOD TRANSFUSION? Blood Transfusion Information  A transfusion is the replacement of blood or some of its parts. Blood is made up of multiple cells which provide different functions.  Red blood cells carry oxygen and are used for blood loss replacement.  White blood cells fight against infection.  Platelets control bleeding.  Plasma helps clot blood.  Other blood products are available for specialized needs, such as  hemophilia or other clotting disorders. BEFORE THE TRANSFUSION  Who gives blood for transfusions?   Healthy volunteers who are fully evaluated to make sure their blood is safe. This is blood bank blood. Transfusion therapy is the safest it has ever been in the practice of medicine. Before blood is taken from a donor, a complete history is taken to make sure that person has no history of diseases nor engages in risky social behavior (examples are intravenous drug use or sexual activity with multiple partners). The donor's travel history is screened to minimize risk of transmitting infections, such as malaria. The donated blood is tested for signs of infectious diseases, such as HIV and hepatitis. The blood is then tested to be sure it is compatible with you in order to minimize the chance of a transfusion reaction. If you or a relative donates blood, this is often done in anticipation of surgery and is not appropriate for emergency situations. It takes many days to process the donated blood. RISKS AND COMPLICATIONS Although transfusion therapy is very safe and saves many lives, the main dangers of transfusion include:   Getting an infectious disease.  Developing a transfusion reaction. This is an allergic reaction to something in the blood you were given. Every  precaution is taken to prevent this. The decision to have a blood transfusion has been considered carefully by your caregiver before blood is given. Blood is not given unless the benefits outweigh the risks. AFTER THE TRANSFUSION  Right after receiving a blood transfusion, you will usually feel much better and more energetic. This is especially true if your red blood cells have gotten low (anemic). The transfusion raises the level of the red blood cells which carry oxygen, and this usually causes an energy increase.  The nurse administering the transfusion will monitor you carefully for complications. HOME CARE INSTRUCTIONS  No special  instructions are needed after a transfusion. You may find your energy is better. Speak with your caregiver about any limitations on activity for underlying diseases you may have. SEEK MEDICAL CARE IF:   Your condition is not improving after your transfusion.  You develop redness or irritation at the intravenous (IV) site. SEEK IMMEDIATE MEDICAL CARE IF:  Any of the following symptoms occur over the next 12 hours:  Shaking chills.  You have a temperature by mouth above 102 F (38.9 C), not controlled by medicine.  Chest, back, or muscle pain.  People around you feel you are not acting correctly or are confused.  Shortness of breath or difficulty breathing.  Dizziness and fainting.  You get a rash or develop hives.  You have a decrease in urine output.  Your urine turns a dark color or changes to pink, red, or brown. Any of the following symptoms occur over the next 10 days:  You have a temperature by mouth above 102 F (38.9 C), not controlled by medicine.  Shortness of breath.  Weakness after normal activity.  The white part of the eye turns yellow (jaundice).  You have a decrease in the amount of urine or are urinating less often.  Your urine turns a dark color or changes to pink, red, or brown. Document Released: 02/05/2000 Document Revised: 05/02/2011 Document Reviewed: 09/24/2007 [facility] Patient Information 2014 [place], [place].  _______________________________________________________________________

## 2015-04-27 NOTE — Pre-Procedure Instructions (Signed)
04-27-15 EKG 4'16 Epic. Echo 10'15- Epic. Clearance note -Dr. Yong Channel with chart. Labs- CBC/d,CMP, Urinalysis,Hgb A1C=5.4  done 04-24-15 in Epic. Dr. Caryl Comes aware of Campral use- pt okayed to take AM of, if can handle on empty stomach.

## 2015-04-30 ENCOUNTER — Other Ambulatory Visit (HOSPITAL_COMMUNITY)
Admission: RE | Admit: 2015-04-30 | Discharge: 2015-04-30 | Disposition: A | Discharge status: Home / Self Care | Payer: No Typology Code available for payment source | Source: Ambulatory Visit | Attending: Family Medicine | Admitting: Family Medicine

## 2015-04-30 ENCOUNTER — Ambulatory Visit (INDEPENDENT_AMBULATORY_CARE_PROVIDER_SITE_OTHER): Payer: No Typology Code available for payment source | Admitting: Family Medicine

## 2015-04-30 ENCOUNTER — Encounter: Payer: Self-pay | Admitting: Family Medicine

## 2015-04-30 VITALS — BP 122/60 | HR 83 | Temp 98.8°F | Ht 67.0 in | Wt 236.0 lb

## 2015-04-30 DIAGNOSIS — Z1272 Encounter for screening for malignant neoplasm of vagina: Secondary | ICD-10-CM | POA: Insufficient documentation

## 2015-04-30 DIAGNOSIS — Z0001 Encounter for general adult medical examination with abnormal findings: Secondary | ICD-10-CM

## 2015-04-30 DIAGNOSIS — Z01812 Encounter for preprocedural laboratory examination: Secondary | ICD-10-CM | POA: Diagnosis not present

## 2015-04-30 DIAGNOSIS — R6889 Other general symptoms and signs: Secondary | ICD-10-CM | POA: Diagnosis not present

## 2015-04-30 DIAGNOSIS — E039 Hypothyroidism, unspecified: Secondary | ICD-10-CM

## 2015-04-30 DIAGNOSIS — Z01419 Encounter for gynecological examination (general) (routine) without abnormal findings: Secondary | ICD-10-CM | POA: Insufficient documentation

## 2015-04-30 MED ORDER — LEVOTHYROXINE SODIUM 88 MCG PO TABS: 88.0000 ug | ORAL_TABLET | Freq: Every day | ORAL | Status: DC

## 2015-04-30 NOTE — Progress Notes (Signed)
[name], MD Phone: [phone]  Subjective:  Patient presents today for their annual physical. Chief complaint-noted.   See problem oriented charting- ROS- full  review of systems was completed and negative except for:  Some burning and pain in her feet- recent neuropathy workup. L knee pain with upcoming knee replacement  The following were reviewed and entered/updated in epic: Past Medical History  Diagnosis Date  . Depression   . Hypertension   . Hyperlipidemia   . Colon polyps   . Thyroid disease   . History of recurrent UTIs   . Anemia     menstrual related  . Diabetes mellitus 12/2009    type 2  . Alcohol problem drinking     rehab  . Acute meniscal tear of knee     Left knee  . Heart murmur   . Shortness of breath     with exertion   . Hypothyroidism   . Anxiety   . Diabetes mellitus, type II ([place])   . Liver disease   . 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 Evalluate for OSA (obstructive sleep apnea) 08/15/2013    No OSA on sleep study but may be underestimated.-never used cpap    . GERD (gastroesophageal reflux disease)     not currently taking.  . Arthritis     right ankle-neuropathy  . Neuromuscular disorder ([place])     neropathy bilateral feet.   Patient Active Problem List   Diagnosis Date Noted  . Alcoholic fatty liver [id number]    Priority: High  . Type II diabetes mellitus, well controlled ([place]) 06/23/2010    Priority: High  . Alcohol use disorder, severe, dependence ([place]) 06/23/2010    Priority: High  . Chronic depression 06/23/2010    Priority: High  . Hypothyroid 07/21/2010    Priority: Medium  . HTN (hypertension) 06/23/2010    Priority: Medium  . Hyperlipidemia 06/23/2010    Priority: Medium  . Former smoker 07/10/2014    Priority: Low  . Osteoarthritis 05/16/2014    Priority: Low  . Chronic post-traumatic stress disorder (PTSD) 05/02/2014    Priority: Low  . Sleep disorder 05/02/2014      Priority: Low  . Family history of alcoholism 05/02/2014    Priority: Low  . Macrocytosis 04/19/2014    Priority: Low  . Fatigue 08/15/2013    Priority: Low  . Thyroid nodule 09/17/2012    Priority: Low  . Obesity (BMI 30-39.9) 06/23/2010    Priority: Low  . History of colonic polyps 04/12/2010    Priority: Low  . Exertional shortness of breath 06/07/2014  . Chest pain 06/07/2014   Past Surgical History  Procedure Laterality Date  . Total knee arthroplasty  2009    right  . Cesarean section  1987  . Cholecystectomy    . Tubal ligation  1987  . Tonsillectomy    . Adenoidectomy    . Right rotator cuff surgery     . Right ankle surgery       x 2- no retained hardware  . Total knee revision Right 12/16/2013    Procedure: RIGHT TOTAL KNEE REVISION POLY EXCHANGE;  Surgeon: Mauri Pole, MD;  Location: [facility];  Service: Orthopedics;  Laterality: Right;  . Mass excision Left 12/16/2013    Procedure: EXCISION LEFT  DISTAL THIGH MASS;  Surgeon: Mauri Pole, MD;  Location: [facility];  Service: Orthopedics;  Laterality: Left;  . Breast surgery  Right     lumpectomy-benign    Family History  Problem Relation Age of Onset  . Adopted: Yes  . COPD Mother   . Alcohol abuse Father     Medications- reviewed and updated Current Outpatient Prescriptions  Medication Sig Dispense Refill  . acamprosate (CAMPRAL) 333 MG tablet Take 2 tablets (666 mg total) by mouth 3 (three) times daily with meals. 540 tablet 3  . ACCU-CHEK FASTCLIX LANCETS MISC Use to test blood sugars daily. Dx: E11.9 100 each 11  . B Complex Vitamins (B COMPLEX 100 PO) Take 1 tablet by mouth daily.     Marland Kitchen buPROPion (WELLBUTRIN XL) 300 MG 24 hr tablet Take 1 tablet (300 mg total) by mouth daily. 90 tablet 3  . escitalopram (LEXAPRO) 20 MG tablet Take 1 tablet (20 mg total) by mouth daily. 90 tablet 3  . glucose blood (ACCU-CHEK COMPACT STRIPS) test strip Use to test blood sugars daily. Dx: E11.9 100 each 12  .  levothyroxine (SYNTHROID, LEVOTHROID) 88 MCG tablet Take 1 tablet (88 mcg total) by mouth daily. 90 tablet 3  . SitaGLIPtin-MetFORMIN HCl (JANUMET XR) 50-1000 MG TB24 Take 2 tablets by mouth daily. 180 tablet 1  . thiamine (VITAMIN B-1) 50 MG tablet Take 1 tablet (50 mg total) by mouth every morning. 90 tablet 1  . zinc gluconate 50 MG tablet Take 50 mg by mouth daily.    . traZODone (DESYREL) 50 MG tablet TAKE 1 TABLET AT BEDTIME AS NEEDED,MAY REPEAT 1 TIME IF NEEDED FOR SLEEP (Patient not taking: Reported on 04/30/2015) 90 tablet 3   No current facility-administered medications for this visit.    Allergies-reviewed and updated Allergies  Allergen Reactions  . Adhesive [Tape] Other (See Comments)    blisters  . Other Other (See Comments)    (Neoprene)--causes blisters.     Social History   Social History  . Marital Status: Married    Spouse Name: N/A  . Number of Children: N/A  . Years of Education: N/A   Social History Main Topics  . Smoking status: Former Smoker -- 1.50 packs/day for 30 years    Types: Cigarettes    Quit date: 02/21/2009  . Smokeless tobacco: Never Used  . Alcohol Use: 14.4 oz/week    10 Glasses of wine, 14 Cans of beer per week     Comment: quit January 2015 started in august 2015 , quit again 3-26- 2016, past hx ETOH abuse  . Drug Use: No     Comment: marijuana in past  . Sexual Activity: Yes   Other Topics Concern  . None   Social History Narrative   Married. 1 daughter. No grandkids. 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       Hobbies: shopping, travel    ROS--See HPI   Objective: BP 122/60 mmHg  Pulse 83  Temp(Src) 98.8 F (37.1 C)  Ht 5\' 7"  (1.702 m)  Wt 236 lb (107.049 kg)  BMI 36.95 kg/m2 Gen: NAD, resting comfortably HEENT: Mucous membranes are moist. Oropharynx normal Neck: no thyromegaly CV: RRR no murmurs rubs or gallops Lungs: CTAB no crackles, wheeze, rhonchi Abdomen:  soft/nontender/nondistended/normal bowel sounds. No rebound or guarding.  Ext: no edema Skin: warm, dry Neuro: grossly normal, moves all extremities, PERRLA  Diabetic Foot Exam - Simple   Simple Foot Form  Diabetic Foot exam was performed with the following findings:  Yes 04/30/2015  2:29 PM  Visual Inspection  No deformities, no ulcerations, no other skin breakdown bilaterally:  Yes  Sensation Testing  Intact to touch and monofilament testing bilaterally:  Yes  Pulse Check  Posterior Tibialis and Dorsalis pulse intact bilaterally:  Yes  Comments     Assessment/Plan:  60 y.o. female presenting for annual physical.  Health Maintenance counseling: 1. Anticipatory guidance: Patient counseled regarding regular dental exams (not going- advised), eye exams, wearing seatbelts.  2. Risk factor reduction:  Advised patient of need for regular exercise (advised to start) and diet rich and fruits and vegetables to reduce risk of heart attack and stroke.  3. Immunizations/screenings/ancillary studies Health Maintenance Due  Topic Date Due  . FOOT EXAM - normal today 08/12/1965  . PAP SMEAR - today  10/07/2014   4. Cervical cancer screening- update today, last 2013 5. Breast cancer screening-  breast exam declined and mammogram 10/24/13. Advised update 6. Colon cancer screening - 12/18/2012. With 5 year repeat  Hypothyroid Lab Results  Component Value Date   TSH 0.18* 04/24/2015  reduce from 100 mcg to 88 mcg with recheck 2 months   Rest of chronic disease stable. Liver continues to look better- slightly high bilirubin  Return in about 4 months (around 08/30/2015) for follow up- diabetes. Return precautions advised.   Orders Placed This Encounter  Procedures  . TSH    Standing Status: Future     Number of Occurrences:      Standing Expiration Date: 04/29/2016    Meds ordered this encounter  Medications  . levothyroxine (SYNTHROID, LEVOTHROID) 88 MCG tablet    Sig: Take 1 tablet (88  mcg total) by mouth daily.    Dispense:  90 tablet    Refill:  3

## 2015-04-30 NOTE — Addendum Note (Signed)
Addended by: Clyde Lundborg A on: 04/30/2015 02:52 PM   Modules accepted: Orders

## 2015-04-30 NOTE — Assessment & Plan Note (Signed)
Lab Results  Component Value Date   TSH 0.18* 04/24/2015  reduce from 100 mcg to 88 mcg with recheck 2 months

## 2015-04-30 NOTE — Patient Instructions (Addendum)
Overall things look great  Only change is decreasing thyroid medicine to 88 mcg then following up for TSH in 8 weeks.   Also get your mammogram and have them send [place] results. Want to get this at least every 2 years but with dense breasts yearly is more idea.

## 2015-05-01 LAB — CYTOLOGY - PAP

## 2015-05-02 ENCOUNTER — Other Ambulatory Visit: Payer: Self-pay | Admitting: Family Medicine

## 2015-05-04 ENCOUNTER — Encounter: Payer: Self-pay | Admitting: Family Medicine

## 2015-05-04 NOTE — Anesthesia Preprocedure Evaluation (Addendum)
Anesthesia Evaluation  Patient identified by MRN, date of birth, ID band Patient awake    Reviewed: Allergy & Precautions, NPO status , Patient's Chart, lab work & pertinent test results  Airway Mallampati: I  TM Distance: >3 FB Neck ROM: Full    Dental  (+) Teeth Intact   Pulmonary sleep apnea , former smoker,    breath sounds clear to auscultation       Cardiovascular hypertension,  Rhythm:Regular Rate:Normal     Neuro/Psych PSYCHIATRIC DISORDERS Anxiety Depression  Neuromuscular disease    GI/Hepatic GERD  ,(+)     substance abuse  alcohol use, Toxin Related  Endo/Other  diabetes, Type 2, Oral Hypoglycemic AgentsHypothyroidism   Renal/GU   negative genitourinary   Musculoskeletal  (+) Arthritis ,   Abdominal   Peds negative pediatric ROS (+)  Hematology   Anesthesia Other Findings - HLD  Reproductive/Obstetrics negative OB ROS                           Lab Results  Component Value Date   WBC 7.4 04/24/2015   HGB 14.3 04/24/2015   HCT 41.2 04/24/2015   MCV 95.6 04/24/2015   PLT 188.0 04/24/2015   Lab Results  Component Value Date   CREATININE 1.00 04/24/2015   BUN 20 04/24/2015   NA 140 04/24/2015   K 4.4 04/24/2015   CL 105 04/24/2015   CO2 25 04/24/2015   Lab Results  Component Value Date   INR 1.15 04/27/2015   INR 1.21 12/11/2013   INR 1.2* 04/12/2010   06/2014 Stress Test: No EKG Changes @ 5 METS  05/2014: EKG: normal sinus rhythm.   Anesthesia Physical Anesthesia Plan  ASA: III  Anesthesia Plan: General   Post-op Pain Management:    Induction: Intravenous  Airway Management Planned: Oral ETT and Video Laryngoscope Planned  Additional Equipment:   Intra-op Plan:   Post-operative Plan: Extubation in OR  Informed Consent: I have reviewed the patients History and Physical, chart, labs and discussed the procedure including the risks, benefits and  alternatives for the proposed anesthesia with the patient or authorized representative who has indicated his/her understanding and acceptance.   Dental advisory given  Plan Discussed with: CRNA  Anesthesia Plan Comments: (Pt preferred GA for last TKA, she would like same thing today with no additions. Pt does not want adductor canal block at this time. )      Anesthesia Quick Evaluation

## 2015-05-05 ENCOUNTER — Inpatient Hospital Stay (HOSPITAL_COMMUNITY): Payer: No Typology Code available for payment source | Admitting: Anesthesiology

## 2015-05-05 ENCOUNTER — Encounter (HOSPITAL_COMMUNITY): Payer: Self-pay | Admitting: *Deleted

## 2015-05-05 ENCOUNTER — Ambulatory Visit (HOSPITAL_COMMUNITY): Payer: Self-pay | Admitting: Licensed Clinical Social Worker

## 2015-05-05 ENCOUNTER — Encounter (HOSPITAL_COMMUNITY)
Admission: RE | Disposition: A | Discharge status: Home Health | Payer: Self-pay | Source: Ambulatory Visit | Attending: Orthopedic Surgery

## 2015-05-05 ENCOUNTER — Inpatient Hospital Stay (HOSPITAL_COMMUNITY)
Admission: RE | Admit: 2015-05-05 | Discharge: 2015-05-07 | DRG: 470 | Disposition: A | Discharge status: Home Health | Payer: No Typology Code available for payment source | Source: Ambulatory Visit | Attending: Orthopedic Surgery | Admitting: Orthopedic Surgery

## 2015-05-05 DIAGNOSIS — M1712 Unilateral primary osteoarthritis, left knee: Secondary | ICD-10-CM | POA: Diagnosis present

## 2015-05-05 DIAGNOSIS — Z96652 Presence of left artificial knee joint: Secondary | ICD-10-CM

## 2015-05-05 DIAGNOSIS — M25562 Pain in left knee: Secondary | ICD-10-CM | POA: Diagnosis present

## 2015-05-05 DIAGNOSIS — I10 Essential (primary) hypertension: Secondary | ICD-10-CM | POA: Diagnosis present

## 2015-05-05 DIAGNOSIS — E669 Obesity, unspecified: Secondary | ICD-10-CM | POA: Diagnosis present

## 2015-05-05 DIAGNOSIS — Z01812 Encounter for preprocedural laboratory examination: Secondary | ICD-10-CM | POA: Diagnosis not present

## 2015-05-05 DIAGNOSIS — Z96651 Presence of right artificial knee joint: Secondary | ICD-10-CM | POA: Diagnosis present

## 2015-05-05 DIAGNOSIS — K219 Gastro-esophageal reflux disease without esophagitis: Secondary | ICD-10-CM | POA: Diagnosis present

## 2015-05-05 DIAGNOSIS — E785 Hyperlipidemia, unspecified: Secondary | ICD-10-CM | POA: Diagnosis present

## 2015-05-05 DIAGNOSIS — Z6836 Body mass index (BMI) 36.0-36.9, adult: Secondary | ICD-10-CM

## 2015-05-05 DIAGNOSIS — E119 Type 2 diabetes mellitus without complications: Secondary | ICD-10-CM | POA: Diagnosis present

## 2015-05-05 DIAGNOSIS — E039 Hypothyroidism, unspecified: Secondary | ICD-10-CM | POA: Diagnosis present

## 2015-05-05 DIAGNOSIS — Z96659 Presence of unspecified artificial knee joint: Secondary | ICD-10-CM

## 2015-05-05 DIAGNOSIS — Z87891 Personal history of nicotine dependence: Secondary | ICD-10-CM | POA: Diagnosis not present

## 2015-05-05 DIAGNOSIS — Z7984 Long term (current) use of oral hypoglycemic drugs: Secondary | ICD-10-CM | POA: Diagnosis not present

## 2015-05-05 HISTORY — PX: TOTAL KNEE ARTHROPLASTY: SHX125

## 2015-05-05 LAB — GLUCOSE, CAPILLARY
Glucose-Capillary: 82 mg/dL (ref 65–99)
Glucose-Capillary: 133 mg/dL — ABNORMAL HIGH (ref 65–99)
Glucose-Capillary: 232 mg/dL — ABNORMAL HIGH (ref 65–99)
Glucose-Capillary: 316 mg/dL — ABNORMAL HIGH (ref 65–99)

## 2015-05-05 LAB — TYPE AND SCREEN
ABO/RH(D): A POS
Antibody Screen: NEGATIVE

## 2015-05-05 SURGERY — ARTHROPLASTY, KNEE, TOTAL
Anesthesia: General | Site: Knee | Laterality: Left

## 2015-05-05 MED ORDER — PROPOFOL 10 MG/ML IV BOLUS
INTRAVENOUS | Status: DC | PRN
  Administered 2015-05-05: 200 mg via INTRAVENOUS

## 2015-05-05 MED ORDER — TRAZODONE HCL 50 MG PO TABS: 50.0000 mg | ORAL_TABLET | Freq: Every evening | ORAL | Status: DC | PRN

## 2015-05-05 MED ORDER — HYDROMORPHONE HCL 1 MG/ML IJ SOLN
INTRAMUSCULAR | Status: AC
  Filled 2015-05-05: qty 1

## 2015-05-05 MED ORDER — HYDROMORPHONE HCL 1 MG/ML IJ SOLN
INTRAMUSCULAR | Status: AC
Start: 2015-05-05 — End: 2015-05-05
  Filled 2015-05-05: qty 1

## 2015-05-05 MED ORDER — CHLORHEXIDINE GLUCONATE 4 % EX LIQD: 60.0000 mL | Freq: Once | CUTANEOUS | Status: DC

## 2015-05-05 MED ORDER — BUPROPION HCL ER (XL) 300 MG PO TB24
300.0000 mg | ORAL_TABLET | Freq: Every day | ORAL | Status: DC
  Administered 2015-05-06 – 2015-05-07 (×2): 300 mg via ORAL
  Filled 2015-05-05 (×2): qty 1

## 2015-05-05 MED ORDER — METFORMIN HCL ER 750 MG PO TB24
2000.0000 mg | ORAL_TABLET | Freq: Every day | ORAL | Status: DC
  Administered 2015-05-06 – 2015-05-07 (×2): 2000 mg via ORAL
  Filled 2015-05-05 (×3): qty 1

## 2015-05-05 MED ORDER — CEFAZOLIN SODIUM-DEXTROSE 2-3 GM-% IV SOLR
2.0000 g | INTRAVENOUS | Status: AC
  Administered 2015-05-05: 2 g via INTRAVENOUS

## 2015-05-05 MED ORDER — PROPOFOL 10 MG/ML IV BOLUS
INTRAVENOUS | Status: AC
  Filled 2015-05-05: qty 20

## 2015-05-05 MED ORDER — LINAGLIPTIN 5 MG PO TABS
5.0000 mg | ORAL_TABLET | Freq: Every day | ORAL | Status: DC
  Administered 2015-05-06 – 2015-05-07 (×2): 5 mg via ORAL
  Filled 2015-05-05 (×3): qty 1

## 2015-05-05 MED ORDER — MIDAZOLAM HCL 5 MG/5ML IJ SOLN
INTRAMUSCULAR | Status: DC | PRN
  Administered 2015-05-05: 2 mg via INTRAVENOUS

## 2015-05-05 MED ORDER — LACTATED RINGERS IV SOLN: INTRAVENOUS | Status: DC

## 2015-05-05 MED ORDER — DEXAMETHASONE SODIUM PHOSPHATE 10 MG/ML IJ SOLN
INTRAMUSCULAR | Status: AC
  Filled 2015-05-05: qty 1

## 2015-05-05 MED ORDER — KETOROLAC TROMETHAMINE 30 MG/ML IJ SOLN
INTRAMUSCULAR | Status: AC
  Filled 2015-05-05: qty 1

## 2015-05-05 MED ORDER — HYDROMORPHONE HCL 1 MG/ML IJ SOLN
INTRAMUSCULAR | Status: DC | PRN
  Administered 2015-05-05: .4 mg via INTRAVENOUS
  Administered 2015-05-05 (×2): .2 mg via INTRAVENOUS
  Administered 2015-05-05 (×3): .4 mg via INTRAVENOUS

## 2015-05-05 MED ORDER — GLIMEPIRIDE 4 MG PO TABS
4.0000 mg | ORAL_TABLET | Freq: Every day | ORAL | Status: DC
  Administered 2015-05-06 – 2015-05-07 (×2): 4 mg via ORAL
  Filled 2015-05-05 (×3): qty 1

## 2015-05-05 MED ORDER — TRANEXAMIC ACID 1000 MG/10ML IV SOLN
1000.0000 mg | Freq: Once | INTRAVENOUS | Status: AC
  Administered 2015-05-05: 1000 mg via INTRAVENOUS
  Filled 2015-05-05: qty 10

## 2015-05-05 MED ORDER — STERILE WATER FOR IRRIGATION IR SOLN
Status: DC | PRN
  Administered 2015-05-05: 2000 mL

## 2015-05-05 MED ORDER — CEFAZOLIN SODIUM-DEXTROSE 2-3 GM-% IV SOLR
2.0000 g | Freq: Four times a day (QID) | INTRAVENOUS | Status: AC
  Administered 2015-05-05 (×2): 2 g via INTRAVENOUS
  Filled 2015-05-05 (×2): qty 50

## 2015-05-05 MED ORDER — SODIUM CHLORIDE 0.9 % IJ SOLN
INTRAMUSCULAR | Status: AC
  Filled 2015-05-05: qty 50

## 2015-05-05 MED ORDER — BUPIVACAINE-EPINEPHRINE (PF) 0.25% -1:200000 IJ SOLN
INTRAMUSCULAR | Status: DC | PRN
  Administered 2015-05-05: 30 mL

## 2015-05-05 MED ORDER — ALUM & MAG HYDROXIDE-SIMETH 200-200-20 MG/5ML PO SUSP
30.0000 mL | ORAL | Status: DC | PRN
  Administered 2015-05-07: 30 mL via ORAL
  Filled 2015-05-05: qty 30

## 2015-05-05 MED ORDER — KETOROLAC TROMETHAMINE 30 MG/ML IJ SOLN
INTRAMUSCULAR | Status: DC | PRN
  Administered 2015-05-05: 30 mg

## 2015-05-05 MED ORDER — ASPIRIN EC 325 MG PO TBEC
325.0000 mg | DELAYED_RELEASE_TABLET | Freq: Two times a day (BID) | ORAL | Status: DC
  Administered 2015-05-06 – 2015-05-07 (×3): 325 mg via ORAL
  Filled 2015-05-05 (×5): qty 1

## 2015-05-05 MED ORDER — FERROUS SULFATE 325 (65 FE) MG PO TABS
325.0000 mg | ORAL_TABLET | Freq: Three times a day (TID) | ORAL | Status: DC
  Administered 2015-05-06 – 2015-05-07 (×4): 325 mg via ORAL
  Filled 2015-05-05 (×7): qty 1

## 2015-05-05 MED ORDER — HYDROMORPHONE HCL 2 MG/ML IJ SOLN
INTRAMUSCULAR | Status: AC
  Filled 2015-05-05: qty 1

## 2015-05-05 MED ORDER — MIDAZOLAM HCL 2 MG/2ML IJ SOLN
INTRAMUSCULAR | Status: AC
  Filled 2015-05-05: qty 2

## 2015-05-05 MED ORDER — FENTANYL CITRATE (PF) 250 MCG/5ML IJ SOLN
INTRAMUSCULAR | Status: AC
  Filled 2015-05-05: qty 5

## 2015-05-05 MED ORDER — FENTANYL CITRATE (PF) 250 MCG/5ML IJ SOLN
INTRAMUSCULAR | Status: DC | PRN
  Administered 2015-05-05 (×2): 50 ug via INTRAVENOUS
  Administered 2015-05-05: 100 ug via INTRAVENOUS
  Administered 2015-05-05: 50 ug via INTRAVENOUS

## 2015-05-05 MED ORDER — SODIUM CHLORIDE 0.9 % IR SOLN
Status: DC | PRN
  Administered 2015-05-05: 1000 mL

## 2015-05-05 MED ORDER — SITAGLIP PHOS-METFORMIN HCL ER 50-1000 MG PO TB24: 2.0000 | ORAL_TABLET | Freq: Every day | ORAL | Status: DC

## 2015-05-05 MED ORDER — METFORMIN HCL 500 MG PO TABS
2000.0000 mg | ORAL_TABLET | Freq: Every day | ORAL | Status: DC
  Filled 2015-05-05: qty 4

## 2015-05-05 MED ORDER — CELECOXIB 200 MG PO CAPS
200.0000 mg | ORAL_CAPSULE | Freq: Two times a day (BID) | ORAL | Status: DC
  Administered 2015-05-05 – 2015-05-07 (×4): 200 mg via ORAL
  Filled 2015-05-05 (×5): qty 1

## 2015-05-05 MED ORDER — POLYETHYLENE GLYCOL 3350 17 G PO PACK
17.0000 g | PACK | Freq: Two times a day (BID) | ORAL | Status: DC
Start: 2015-05-05 — End: 2015-05-07
  Administered 2015-05-05 – 2015-05-07 (×5): 17 g via ORAL

## 2015-05-05 MED ORDER — ESCITALOPRAM OXALATE 20 MG PO TABS
20.0000 mg | ORAL_TABLET | Freq: Every day | ORAL | Status: DC
  Administered 2015-05-06 – 2015-05-07 (×2): 20 mg via ORAL
  Filled 2015-05-05 (×3): qty 1

## 2015-05-05 MED ORDER — HYDROCODONE-ACETAMINOPHEN 7.5-325 MG PO TABS
1.0000 | ORAL_TABLET | ORAL | Status: DC
  Administered 2015-05-05 – 2015-05-06 (×5): 2 via ORAL
  Filled 2015-05-05 (×5): qty 2

## 2015-05-05 MED ORDER — METOCLOPRAMIDE HCL 10 MG PO TABS: 5.0000 mg | ORAL_TABLET | Freq: Three times a day (TID) | ORAL | Status: DC | PRN

## 2015-05-05 MED ORDER — TRANEXAMIC ACID 1000 MG/10ML IV SOLN
1000.0000 mg | Freq: Once | INTRAVENOUS | Status: AC
  Administered 2015-05-05: 1000 mg via INTRAVENOUS
  Filled 2015-05-05 (×2): qty 10

## 2015-05-05 MED ORDER — ACAMPROSATE CALCIUM 333 MG PO TBEC
666.0000 mg | DELAYED_RELEASE_TABLET | Freq: Three times a day (TID) | ORAL | Status: DC
  Administered 2015-05-05 – 2015-05-07 (×5): 666 mg via ORAL
  Filled 2015-05-05 (×8): qty 2

## 2015-05-05 MED ORDER — MEPERIDINE HCL 50 MG/ML IJ SOLN: 6.2500 mg | INTRAMUSCULAR | Status: DC | PRN

## 2015-05-05 MED ORDER — DEXAMETHASONE SODIUM PHOSPHATE 10 MG/ML IJ SOLN
10.0000 mg | Freq: Once | INTRAMUSCULAR | Status: AC
  Administered 2015-05-05: 10 mg via INTRAVENOUS

## 2015-05-05 MED ORDER — BUPIVACAINE-EPINEPHRINE (PF) 0.25% -1:200000 IJ SOLN
INTRAMUSCULAR | Status: AC
  Filled 2015-05-05: qty 30

## 2015-05-05 MED ORDER — ONDANSETRON HCL 4 MG PO TABS: 4.0000 mg | ORAL_TABLET | Freq: Four times a day (QID) | ORAL | Status: DC | PRN

## 2015-05-05 MED ORDER — SODIUM CHLORIDE 0.9 % IJ SOLN
INTRAMUSCULAR | Status: DC | PRN
  Administered 2015-05-05: 30 mL

## 2015-05-05 MED ORDER — PROMETHAZINE HCL 25 MG/ML IJ SOLN: 6.2500 mg | INTRAMUSCULAR | Status: DC | PRN

## 2015-05-05 MED ORDER — LIDOCAINE HCL (CARDIAC) 20 MG/ML IV SOLN
INTRAVENOUS | Status: DC | PRN
  Administered 2015-05-05: 80 mg via INTRATRACHEAL

## 2015-05-05 MED ORDER — DIPHENHYDRAMINE HCL 25 MG PO CAPS: 25.0000 mg | ORAL_CAPSULE | Freq: Four times a day (QID) | ORAL | Status: DC | PRN

## 2015-05-05 MED ORDER — SODIUM CHLORIDE 0.9 % IV SOLN
INTRAVENOUS | Status: DC
  Administered 2015-05-05: 12:00:00 via INTRAVENOUS
  Filled 2015-05-05 (×7): qty 1000

## 2015-05-05 MED ORDER — DOCUSATE SODIUM 100 MG PO CAPS
100.0000 mg | ORAL_CAPSULE | Freq: Two times a day (BID) | ORAL | Status: DC
  Administered 2015-05-05 – 2015-05-07 (×5): 100 mg via ORAL

## 2015-05-05 MED ORDER — 0.9 % SODIUM CHLORIDE (POUR BTL) OPTIME
TOPICAL | Status: DC | PRN
  Administered 2015-05-05: 1000 mL

## 2015-05-05 MED ORDER — LACTATED RINGERS IV SOLN
INTRAVENOUS | Status: DC | PRN
  Administered 2015-05-05 (×2): via INTRAVENOUS

## 2015-05-05 MED ORDER — BISACODYL 10 MG RE SUPP: 10.0000 mg | Freq: Every day | RECTAL | Status: DC | PRN

## 2015-05-05 MED ORDER — PHENOL 1.4 % MT LIQD
1.0000 | OROMUCOSAL | Status: DC | PRN
Start: 2015-05-05 — End: 2015-05-07

## 2015-05-05 MED ORDER — METOCLOPRAMIDE HCL 5 MG/ML IJ SOLN: 5.0000 mg | Freq: Three times a day (TID) | INTRAMUSCULAR | Status: DC | PRN

## 2015-05-05 MED ORDER — INSULIN ASPART 100 UNIT/ML ~~LOC~~ SOLN
0.0000 [IU] | Freq: Three times a day (TID) | SUBCUTANEOUS | Status: DC
  Administered 2015-05-05: 11 [IU] via SUBCUTANEOUS
  Administered 2015-05-05: 5 [IU] via SUBCUTANEOUS
  Administered 2015-05-06: 2 [IU] via SUBCUTANEOUS
  Administered 2015-05-06: 3 [IU] via SUBCUTANEOUS
  Administered 2015-05-06: 2 [IU] via SUBCUTANEOUS

## 2015-05-05 MED ORDER — METHOCARBAMOL 1000 MG/10ML IJ SOLN
500.0000 mg | Freq: Four times a day (QID) | INTRAVENOUS | Status: DC | PRN
  Administered 2015-05-05: 500 mg via INTRAVENOUS
  Filled 2015-05-05 (×2): qty 5

## 2015-05-05 MED ORDER — MAGNESIUM CITRATE PO SOLN: 1.0000 | Freq: Once | ORAL | Status: DC | PRN

## 2015-05-05 MED ORDER — HYDROMORPHONE HCL 1 MG/ML IJ SOLN
0.5000 mg | INTRAMUSCULAR | Status: DC | PRN
  Administered 2015-05-05: 2 mg via INTRAVENOUS
  Administered 2015-05-05: 1 mg via INTRAVENOUS
  Filled 2015-05-05: qty 2
  Filled 2015-05-05: qty 1

## 2015-05-05 MED ORDER — METHOCARBAMOL 500 MG PO TABS
500.0000 mg | ORAL_TABLET | Freq: Four times a day (QID) | ORAL | Status: DC | PRN
  Administered 2015-05-05 – 2015-05-06 (×4): 500 mg via ORAL
  Filled 2015-05-05 (×4): qty 1

## 2015-05-05 MED ORDER — SUCCINYLCHOLINE CHLORIDE 20 MG/ML IJ SOLN
INTRAMUSCULAR | Status: DC | PRN
  Administered 2015-05-05: 100 mg via INTRAVENOUS

## 2015-05-05 MED ORDER — ONDANSETRON HCL 4 MG/2ML IJ SOLN
INTRAMUSCULAR | Status: DC | PRN
  Administered 2015-05-05: 4 mg via INTRAVENOUS

## 2015-05-05 MED ORDER — DEXAMETHASONE SODIUM PHOSPHATE 10 MG/ML IJ SOLN: 10.0000 mg | Freq: Once | INTRAMUSCULAR | Status: DC

## 2015-05-05 MED ORDER — LIDOCAINE HCL (CARDIAC) 20 MG/ML IV SOLN
INTRAVENOUS | Status: AC
  Filled 2015-05-05: qty 5

## 2015-05-05 MED ORDER — MENTHOL 3 MG MT LOZG: 1.0000 | LOZENGE | OROMUCOSAL | Status: DC | PRN

## 2015-05-05 MED ORDER — ROCURONIUM BROMIDE 100 MG/10ML IV SOLN
INTRAVENOUS | Status: AC
  Filled 2015-05-05: qty 1

## 2015-05-05 MED ORDER — ONDANSETRON HCL 4 MG/2ML IJ SOLN
4.0000 mg | Freq: Four times a day (QID) | INTRAMUSCULAR | Status: DC | PRN
Start: 2015-05-05 — End: 2015-05-07

## 2015-05-05 MED ORDER — ONDANSETRON HCL 4 MG/2ML IJ SOLN
INTRAMUSCULAR | Status: AC
  Filled 2015-05-05: qty 2

## 2015-05-05 MED ORDER — LEVOTHYROXINE SODIUM 88 MCG PO TABS
88.0000 ug | ORAL_TABLET | Freq: Every day | ORAL | Status: DC
  Administered 2015-05-06 – 2015-05-07 (×2): 88 ug via ORAL
  Filled 2015-05-05 (×3): qty 1

## 2015-05-05 MED ORDER — HYDROMORPHONE HCL 1 MG/ML IJ SOLN
0.2500 mg | INTRAMUSCULAR | Status: DC | PRN
  Administered 2015-05-05 (×4): 0.5 mg via INTRAVENOUS

## 2015-05-05 MED ORDER — CEFAZOLIN SODIUM-DEXTROSE 2-3 GM-% IV SOLR
INTRAVENOUS | Status: AC
  Filled 2015-05-05: qty 50

## 2015-05-05 SURGICAL SUPPLY — 45 items
BAG DECANTER FOR FLEXI CONT (MISCELLANEOUS) IMPLANT
BAG ZIPLOCK 12X15 (MISCELLANEOUS) IMPLANT
BANDAGE ACE 6X5 VEL STRL LF (GAUZE/BANDAGES/DRESSINGS) ×3 IMPLANT
BLADE SAW SGTL 13.0X1.19X90.0M (BLADE) ×3 IMPLANT
BONE CEMENT GENTAMICIN (Cement) ×6 IMPLANT
BOWL SMART MIX CTS (DISPOSABLE) ×3 IMPLANT
CAPT KNEE TOTAL 3 ATTUNE ×3 IMPLANT
CEMENT BONE GENTAMICIN 40 (Cement) ×2 IMPLANT
CLOTH BEACON ORANGE TIMEOUT ST (SAFETY) ×3 IMPLANT
CUFF TOURN SGL QUICK 34 (TOURNIQUET CUFF) ×2
CUFF TRNQT CYL 34X4X40X1 (TOURNIQUET CUFF) ×1 IMPLANT
DECANTER SPIKE VIAL GLASS SM (MISCELLANEOUS) ×3 IMPLANT
DRAPE U-SHAPE 47X51 STRL (DRAPES) ×3 IMPLANT
DRSG AQUACEL AG ADV 3.5X10 (GAUZE/BANDAGES/DRESSINGS) ×3 IMPLANT
DURAPREP 26ML APPLICATOR (WOUND CARE) ×6 IMPLANT
ELECT REM PT RETURN 9FT ADLT (ELECTROSURGICAL) ×3
ELECTRODE REM PT RTRN 9FT ADLT (ELECTROSURGICAL) ×1 IMPLANT
GLOVE BIO SURGEON STRL SZ7.5 (GLOVE) ×3 IMPLANT
GLOVE BIOGEL PI IND STRL 7.5 (GLOVE) ×5 IMPLANT
GLOVE BIOGEL PI IND STRL 8.5 (GLOVE) ×1 IMPLANT
GLOVE BIOGEL PI INDICATOR 7.5 (GLOVE) ×10
GLOVE BIOGEL PI INDICATOR 8.5 (GLOVE) ×2
GLOVE ECLIPSE 8.0 STRL XLNG CF (GLOVE) ×9 IMPLANT
GLOVE ORTHO TXT STRL SZ7.5 (GLOVE) ×6 IMPLANT
GLOVE SURG SS PI 7.5 STRL IVOR (GLOVE) ×3 IMPLANT
GOWN STRL REUS W/TWL LRG LVL3 (GOWN DISPOSABLE) ×3 IMPLANT
GOWN STRL REUS W/TWL XL LVL3 (GOWN DISPOSABLE) ×9 IMPLANT
HANDPIECE INTERPULSE COAX TIP (DISPOSABLE) ×2
LIQUID BAND (GAUZE/BANDAGES/DRESSINGS) ×3 IMPLANT
MANIFOLD NEPTUNE II (INSTRUMENTS) ×3 IMPLANT
PACK TOTAL KNEE CUSTOM (KITS) ×3 IMPLANT
POSITIONER SURGICAL ARM (MISCELLANEOUS) ×3 IMPLANT
SET HNDPC FAN SPRY TIP SCT (DISPOSABLE) ×1 IMPLANT
SET PAD KNEE POSITIONER (MISCELLANEOUS) ×3 IMPLANT
SUCTION FRAZIER HANDLE 12FR (TUBING) ×2
SUCTION TUBE FRAZIER 12FR DISP (TUBING) ×1 IMPLANT
SUT MNCRL AB 4-0 PS2 18 (SUTURE) ×3 IMPLANT
SUT VIC AB 1 CT1 36 (SUTURE) ×3 IMPLANT
SUT VIC AB 2-0 CT1 27 (SUTURE) ×6
SUT VIC AB 2-0 CT1 TAPERPNT 27 (SUTURE) ×3 IMPLANT
SUT VLOC 180 0 24IN GS25 (SUTURE) ×3 IMPLANT
SYR 50ML LL SCALE MARK (SYRINGE) ×3 IMPLANT
TRAY FOLEY W/METER SILVER 14FR (SET/KITS/TRAYS/PACK) ×3 IMPLANT
WRAP KNEE MAXI GEL POST OP (GAUZE/BANDAGES/DRESSINGS) ×3 IMPLANT
YANKAUER SUCT BULB TIP 10FT TU (MISCELLANEOUS) ×3 IMPLANT

## 2015-05-05 NOTE — Op Note (Signed)
NAME:  [name] [name]                      MEDICAL RECORD NO.:  [id number]                             FACILITY:  [facility]      PHYSICIAN:  Pietro Cassis. Alvan Dame, M.D.  DATE OF BIRTH:  09-07-1955      DATE OF PROCEDURE:  05/05/2015                                     OPERATIVE REPORT         PREOPERATIVE DIAGNOSIS:  Left knee osteoarthritis.      POSTOPERATIVE DIAGNOSIS:  Left knee osteoarthritis.      FINDINGS:  The patient was noted to have complete loss of cartilage and   bone-on-bone arthritis with associated osteophytes in the medial and patellofemoral compartments of   the knee with a significant synovitis and associated effusion.      PROCEDURE:  Left total knee replacement.      COMPONENTS USED:  DePuy Attune rotating platform posterior stabilized knee   system, a size 5N femur, 3 tibia, size 6 mm PS AOX insert, and 35 anatomic patellar   button.      SURGEON:  Pietro Cassis. Alvan Dame, M.D.      ASSISTANT:  Danae Orleans, PA-C.      ANESTHESIA:  General.      SPECIMENS:  None.      COMPLICATION:  None.      DRAINS:  None.  EBL: <100cc      TOURNIQUET TIME:   Total Tourniquet Time Documented: Thigh (Left) - 29 minutes Total: Thigh (Left) - 29 minutes     The patient was stable to the recovery room.      INDICATION FOR PROCEDURE:  [name] [name] is a 60 y.o. female patient of   mine.  The patient had been seen, evaluated, and treated conservatively in the   office with medication, activity modification, and injections.  The patient had   radiographic changes of bone-on-bone arthritis with endplate sclerosis and osteophytes noted.      The patient failed conservative measures including medication, injections, and activity modification, and at this point was ready for more definitive measures.   Based on the radiographic changes and failed conservative measures, the patient   decided to proceed with total knee replacement.  Risks of infection,   DVT, component failure, need  for revision surgery, postop course, and   expectations were all   discussed and reviewed.  Consent was obtained for benefit of pain   relief.      PROCEDURE IN DETAIL:  The patient was brought to the operative theater.   Once adequate anesthesia, preoperative antibiotics, 2 gm of Ancef, 1 gm of Tranexamic Acid, and 10 mg of Decadron administered, the patient was positioned supine with the left thigh tourniquet placed.  The  left lower extremity was prepped and draped in sterile fashion.  A time-   out was performed identifying the patient, planned procedure, and   extremity.      The left lower extremity was placed in the [facility] leg holder.  The leg was   exsanguinated, tourniquet elevated to 250 mmHg.  A midline incision was   made followed by median parapatellar arthrotomy.  Following initial   exposure, attention was first directed to the patella.  Precut   measurement was noted to be 22 mm.  I resected down to 14 mm and used a   35 anatomic patellar button to restore patellar height as well as cover the cut   surface.      The lug holes were drilled and a metal shim was placed to protect the   patella from retractors and saw blades.      At this point, attention was now directed to the femur.  The femoral   canal was opened with a drill, irrigated to try to prevent fat emboli.  An   intramedullary rod was passed at 2 degrees valgus, 9 mm of bone was   resected off the distal femur.  Following this resection, the tibia was   subluxated anteriorly.  Using the extramedullary guide, 2 mm of bone was resected off   the proximal medial tibia.  We confirmed the gap would be   stable medially and laterally with a size 5 mm insert as well as confirmed   the cut was perpendicular in the coronal plane, checking with an alignment rod.      Once this was done, I sized the femur to be a size 5 in the anterior-   posterior dimension, chose a narrow component based on medial and   lateral  dimension.  The size 5 rotation block was then pinned in   position anterior referenced using the C-clamp to set rotation.  The   anterior, posterior, and  chamfer cuts were made without difficulty nor   notching making certain that I was along the anterior cortex to help   with flexion gap stability.      The final box cut was made off the lateral aspect of distal femur.      At this point, the tibia was sized to be a size 3, the size 3 tray was   then pinned in position through the medial third of the tubercle,   drilled, and keel punched.  Medial based proximal tibial osteophytes were debrided.  Trial reduction was now carried with a 5 femur,  3 tibia, a size 6 mm insert, and the 35 patella botton.  The knee was brought to   extension, full extension with good flexion stability with the patella   tracking through the trochlea without application of pressure.  Given   all these findings the femoral lug holes were drilled and then the trial components removed.  Final components were   opened and cement was mixed.  The knee was irrigated with normal saline   solution and pulse lavage.  The synovial lining was   then injected with 30 cc of 0.25% Marcaine with epinephrine and 1 cc of Toradol plus 30 cc of NS for a    total of 61 cc.      The knee was irrigated.  Final implants were then cemented onto clean and   dried cut surfaces of bone with the knee brought to extension with a size 6 mm trial insert.      Once the cement had fully cured, the excess cement was removed   throughout the knee.  I confirmed I was satisfied with the range of   motion and stability, and the final size 6 mm PS AOX insert was chosen.  It was   placed into the knee.      The tourniquet had been let down  at 29 minutes.  No significant   hemostasis required.  The   extensor mechanism was then reapproximated using #1 Vicryl and #0 V-lock sutures with the knee   in flexion.  The   remaining wound was closed with  2-0 Vicryl and running 4-0 Monocryl.   The knee was cleaned, dried, dressed sterilely using Dermabond and   Aquacel dressing.  The patient was then   brought to recovery room in stable condition, tolerating the procedure   well.   Please note that Physician Assistant, Danae Orleans, PA-C, was present for the entirety of the case, and was utilized for pre-operative positioning, peri-operative retractor management, general facilitation of the procedure.  He was also utilized for primary wound closure at the end of the case.              Pietro Cassis Alvan Dame, M.D.    05/05/2015 8:50 AM

## 2015-05-05 NOTE — Anesthesia Postprocedure Evaluation (Signed)
Anesthesia Post Note  Patient: Laquina Shortridge  Procedure(s) Performed: Procedure(s) (LRB): LEFT TOTAL KNEE ARTHROPLASTY (Left)  Patient location during evaluation: PACU Anesthesia Type: General Level of consciousness: oriented and awake and alert Pain management: pain level controlled Vital Signs Assessment: post-procedure vital signs reviewed and stable Respiratory status: spontaneous breathing, respiratory function stable and patient connected to nasal cannula oxygen Cardiovascular status: blood pressure returned to baseline and stable Postop Assessment: no signs of nausea or vomiting Anesthetic complications: no    Last Vitals:  Filed Vitals:   05/05/15 1145 05/05/15 1245  BP: 133/52 142/57  Pulse: 88 88  Temp: 37 C 37 C  Resp: 14 14    Last Pain:  Filed Vitals:   05/05/15 1331  PainSc: 3                  Effie Berkshire

## 2015-05-05 NOTE — Anesthesia Procedure Notes (Signed)
Procedure Name: Intubation Date/Time: 05/05/2015 7:23 AM Performed by: Dione Booze Pre-anesthesia Checklist: Emergency Drugs available, Suction available, Patient being monitored and Patient identified Patient Re-evaluated:Patient Re-evaluated prior to inductionOxygen Delivery Method: Circle system utilized Preoxygenation: Pre-oxygenation with 100% oxygen Intubation Type: IV induction Grade View: Grade II Tube type: Oral Number of attempts: 1 Airway Equipment and Method: Stylet Placement Confirmation: ETT inserted through vocal cords under direct vision,  positive ETCO2 and breath sounds checked- equal and bilateral Secured at: 21 cm Tube secured with: Tape Dental Injury: Teeth and Oropharynx as per pre-operative assessment

## 2015-05-05 NOTE — Telephone Encounter (Signed)
She was supposed to stop this- she is correct. Please inform her and remove glimepiride from med list

## 2015-05-05 NOTE — Progress Notes (Signed)
Utilization review completed.  

## 2015-05-05 NOTE — Transfer of Care (Signed)
Immediate Anesthesia Transfer of Care Note  Patient: [name] [name]  Procedure(s) Performed: Procedure(s): LEFT TOTAL KNEE ARTHROPLASTY (Left)  Patient Location: PACU  Anesthesia Type:General  Level of Consciousness: awake, alert , oriented and patient cooperative  Airway & Oxygen Therapy: Patient Spontanous Breathing and Patient connected to face mask oxygen  Post-op Assessment: Report given to RN and Post -op Vital signs reviewed and stable  Post vital signs: Reviewed and stable  Last Vitals:  Filed Vitals:   05/05/15 0516  BP: 145/59  Pulse: 80  Temp: 37 C  Resp: 18    Complications: No apparent anesthesia complications

## 2015-05-05 NOTE — Discharge Instructions (Signed)

## 2015-05-05 NOTE — Interval H&P Note (Signed)
History and Physical Interval Note:  05/05/2015 7:05 AM  [name] [name]  has presented today for surgery, with the diagnosis of Left Knee Osteoarthritis  The various methods of treatment have been discussed with the patient and family. After consideration of risks, benefits and other options for treatment, the patient has consented to  Procedure(s): LEFT TOTAL KNEE ARTHROPLASTY (Left) as a surgical intervention .  The patient's history has been reviewed, patient examined, no change in status, stable for surgery.  I have reviewed the patient's chart and labs.  Questions were answered to the patient's satisfaction.     Mauri Pole

## 2015-05-05 NOTE — Evaluation (Signed)
Physical Therapy Evaluation Patient Details Name: [name] [name] MRN: [id number] DOB: 06-07-1955 Today's Date: 05/05/2015   History of Present Illness  Pt is a 60 year old female s/p L TKA with hx of R TKA and revision, DM2, neuropathy  Clinical Impression  Pt is s/p L TKA resulting in the deficits listed below (see PT Problem List).  Pt will benefit from skilled PT to increase their independence and safety with mobility to allow discharge to the venue listed below.  Pt ambulated in hallway short distance POD #1 and plans to d/c home with spouse.     Follow Up Recommendations Home health PT    Equipment Recommendations  None recommended by PT    Recommendations for Other Services       Precautions / Restrictions Precautions Precautions: Knee Restrictions Other Position/Activity Restrictions: WBAT      Mobility  Bed Mobility Overal bed mobility: Needs Assistance Bed Mobility: Supine to Sit     Supine to sit: Min guard;HOB elevated     General bed mobility comments: verbal cues for self assist  Transfers Overall transfer level: Needs assistance Equipment used: Rolling walker (2 wheeled) Transfers: Sit to/from Stand Sit to Stand: Min guard         General transfer comment: verbal cues for safe technique  Ambulation/Gait Ambulation/Gait assistance: Min guard Ambulation Distance (Feet): 40 Feet Assistive device: Rolling walker (2 wheeled) Gait Pattern/deviations: Step-to pattern;Antalgic     General Gait Details: verbal cues for sequence, RW positioning, posture  Stairs            Wheelchair Mobility    Modified Rankin (Stroke Patients Only)       Balance                                             Pertinent Vitals/Pain Pain Assessment: 0-10 Pain Score: 6  Pain Location: L knee with movement Pain Descriptors / Indicators: Sore;Aching Pain Intervention(s): Limited activity within patient's tolerance;Monitored during  session;Repositioned;Ice applied;Patient requesting pain meds-RN notified    Home Living Family/patient expects to be discharged to:: Private residence Living Arrangements: Spouse/significant other Available Help at Discharge: Family Type of Home: House Home Access: Level entry     Home Layout: One level Home Equipment: Environmental consultant - 2 wheels      Prior Function Level of Independence: Independent               Hand Dominance        Extremity/Trunk Assessment               Lower Extremity Assessment: LLE deficits/detail   LLE Deficits / Details: good quad contraction, functional knee flexion 50* observed with transfers     Communication   Communication: No difficulties  Cognition Arousal/Alertness: Awake/alert Behavior During Therapy: WFL for tasks assessed/performed Overall Cognitive Status: Within Functional Limits for tasks assessed                      General Comments      Exercises        Assessment/Plan    PT Assessment Patient needs continued PT services  PT Diagnosis Difficulty walking;Acute pain   PT Problem List Decreased strength;Decreased activity tolerance;Decreased mobility;Decreased range of motion;Pain  PT Treatment Interventions Functional mobility training;Gait training;DME instruction;Patient/family education;Therapeutic activities;Therapeutic exercise   PT Goals (Current goals can be found in  the Care Plan section) Acute Rehab PT Goals PT Goal Formulation: With patient Time For Goal Achievement: 05/12/15 Potential to Achieve Goals: Good    Frequency 7X/week   Barriers to discharge        Co-evaluation               End of Session Equipment Utilized During Treatment: Gait belt Activity Tolerance: Patient tolerated treatment well Patient left: in chair;with call bell/phone within reach;with chair alarm set Nurse Communication: Mobility status         Time: 1455-1510 PT Time Calculation (min) (ACUTE  ONLY): 15 min   Charges:   PT Evaluation $PT Eval Low Complexity: 1 Procedure     PT G Codes:        Salaam Battershell,KATHrine E 05/05/2015, 4:06 PM Carmelia Bake, PT, DPT 05/05/2015 Pager: [phone]

## 2015-05-06 LAB — GLUCOSE, CAPILLARY
Glucose-Capillary: 267 mg/dL — ABNORMAL HIGH (ref 65–99)
Glucose-Capillary: 179 mg/dL — ABNORMAL HIGH (ref 65–99)
Glucose-Capillary: 144 mg/dL — ABNORMAL HIGH (ref 65–99)
Glucose-Capillary: 150 mg/dL — ABNORMAL HIGH (ref 65–99)
Glucose-Capillary: 120 mg/dL — ABNORMAL HIGH (ref 65–99)

## 2015-05-06 LAB — BASIC METABOLIC PANEL
Anion gap: 9 (ref 5–15)
BUN: 23 mg/dL — ABNORMAL HIGH (ref 6–20)
CO2: 23 mmol/L (ref 22–32)
Calcium: 9.1 mg/dL (ref 8.9–10.3)
Chloride: 103 mmol/L (ref 101–111)
Creatinine, Ser: 1.02 mg/dL — ABNORMAL HIGH (ref 0.44–1.00)
GFR calc Af Amer: >60 mL/min (ref >60)
GFR calc non Af Amer: 59 mL/min — ABNORMAL LOW (ref >60)
Glucose, Bld: 240 mg/dL — ABNORMAL HIGH (ref 65–99)
Potassium: 4.8 mmol/L (ref 3.5–5.1)
Sodium: 135 mmol/L (ref 135–145)

## 2015-05-06 LAB — CBC
HCT: 38.9 % (ref 36.0–46.0)
Hemoglobin: 13.6 g/dL (ref 12.0–15.0)
MCH: 32.9 pg (ref 26.0–34.0)
MCHC: 35 g/dL (ref 30.0–36.0)
MCV: 94 fL (ref 78.0–100.0)
Platelets: 101 10*3/uL — ABNORMAL LOW (ref 150–400)
RBC: 4.14 MIL/uL (ref 3.87–5.11)
RDW: 13.5 % (ref 11.5–15.5)
WBC: 6.3 10*3/uL (ref 4.0–10.5)

## 2015-05-06 MED ORDER — ACETAMINOPHEN 500 MG PO TABS
1000.0000 mg | ORAL_TABLET | Freq: Three times a day (TID) | ORAL | Status: DC
  Administered 2015-05-06 – 2015-05-07 (×3): 1000 mg via ORAL
  Filled 2015-05-06 (×6): qty 2

## 2015-05-06 MED ORDER — OXYCODONE HCL 5 MG PO TABS
5.0000 mg | ORAL_TABLET | ORAL | Status: DC
  Administered 2015-05-06 (×3): 15 mg via ORAL
  Administered 2015-05-06: 10 mg via ORAL
  Administered 2015-05-07 (×3): 15 mg via ORAL
  Filled 2015-05-06 (×6): qty 3
  Filled 2015-05-06: qty 2

## 2015-05-06 NOTE — Progress Notes (Signed)
     Subjective: 1 Day Post-Op Procedure(s) (LRB): LEFT TOTAL KNEE ARTHROPLASTY (Left)   Patient reports pain as moderate, pain not fully controlled. She feels that the pain associated with this knee replacement is more than her last.  Discussed increasing her analgesic medications, hopefully for a short time and then cut back.  No other events throughout the night.   Objective:   VITALS:   Filed Vitals:   05/05/15 2028 05/06/15 0446  BP: 134/53 138/57  Pulse: 71 82  Temp: 98.6 F (37 C) 98.5 F (36.9 C)  Resp: 16 16    Dorsiflexion/Plantar flexion intact Incision: dressing C/D/I No cellulitis present Compartment soft  LABS  Recent Labs  05/06/15 0408  HGB 13.6  HCT 38.9  WBC 6.3  PLT 101*     Recent Labs  05/06/15 0408  NA 135  K 4.8  BUN 23*  CREATININE 1.02*  GLUCOSE 240*     Assessment/Plan: 1 Day Post-Op Procedure(s) (LRB): LEFT TOTAL KNEE ARTHROPLASTY (Left) Foley cath d/c'ed Advance diet Up with therapy D/C IV fluids Discharge home with home health eventually, when ready  Obese (BMI 30-39.9) Estimated body mass index is 36.95 kg/(m^2) as calculated from the following:   Height as of this encounter: 5\' 7"  (1.702 m).   Weight as of this encounter: 107.049 kg (236 lb). Patient also counseled that weight may inhibit the healing process Patient counseled that losing weight will help with future health issues        [name]. [name]   PAC  05/06/2015, 9:31 AM

## 2015-05-06 NOTE — Progress Notes (Signed)
Physical Therapy Treatment Patient Details Name: [name] [name] MRN: [id number] DOB: [birth year] Today's Date: May 24, 2015    History of Present Illness Pt is a 60 year old female s/p L TKA with hx of R TKA and revision, DM2, neuropathy    PT Comments    Pt ambulated in hallway and performed LE exercises.  Pt reports d/c home tomorrow.    Follow Up Recommendations  Home health PT     Equipment Recommendations  None recommended by PT    Recommendations for Other Services       Precautions / Restrictions Precautions Precautions: Knee Restrictions Weight Bearing Restrictions: No Other Position/Activity Restrictions: WBAT    Mobility  Bed Mobility               General bed mobility comments: pt up in recliner on arrival  Transfers Overall transfer level: Needs assistance Equipment used: Rolling walker (2 wheeled) Transfers: Sit to/from Stand Sit to Stand: Supervision         General transfer comment: verbal cues for safe technique  Ambulation/Gait Ambulation/Gait assistance: Supervision Ambulation Distance (Feet): 160 Feet Assistive device: Rolling walker (2 wheeled) Gait Pattern/deviations: Step-to pattern;Antalgic     General Gait Details: verbal cues for sequence, RW positioning, posture   Stairs            Wheelchair Mobility    Modified Rankin (Stroke Patients Only)       Balance                                    Cognition Arousal/Alertness: Awake/alert Behavior During Therapy: WFL for tasks assessed/performed Overall Cognitive Status: Within Functional Limits for tasks assessed                      Exercises Total Joint Exercises Ankle Circles/Pumps: AROM;Both;10 reps Quad Sets: AROM;Both;10 reps Short Arc Quad: AROM;Left;10 reps Heel Slides: AAROM;Left;10 reps Hip ABduction/ADduction: AROM;Left;10 reps Straight Leg Raises: AROM;Left;10 reps Goniometric ROM: L knee AAROM 50* in recliner    General  Comments        Pertinent Vitals/Pain Pain Assessment: 0-10 Pain Score: 5  Pain Location: Lknee Pain Descriptors / Indicators: Sore;Aching Pain Intervention(s): Limited activity within patient's tolerance;Monitored during session;Repositioned;Ice applied    Home Living                      Prior Function            PT Goals (current goals can now be found in the care plan section) Progress towards PT goals: Progressing toward goals    Frequency  7X/week    PT Plan Current plan remains appropriate    Co-evaluation             End of Session   Activity Tolerance: Patient tolerated treatment well Patient left: in chair;with call bell/phone within reach     Time: [id number] PT Time Calculation (min) (ACUTE ONLY): 20 min  Charges:  $Therapeutic Exercise: 8-22 mins                    G Codes:      [name] [name],[name] [name] 05/24/15, 10:28 AM Carmelia Bake, PT, DPT May 24, 2015 Pager: [phone]

## 2015-05-06 NOTE — Care Management Note (Signed)
Case Management Note  Patient Details  Name: [name] [name] MRN: [id number] Date of Birth: 1955/11/06  Subjective/Objective:                  Left total knee replacement. Action/Plan: Discharge planning Expected Discharge Date:  05/07/15               Expected Discharge Plan:  [place]  In-House Referral:     Discharge planning Services  CM Consult  Post Acute Care Choice:  Home Health Choice offered to:     DME Arranged:  N/A DME Agency:  NA  HH Arranged:  PT [place] Agency:  [place]  Status of Service:  Completed, signed off  Medicare Important Message Given:    Date Medicare IM Given:    Medicare IM give by:    Date Additional Medicare IM Given:    Additional Medicare Important Message give by:     If discussed at [place] of Stay Meetings, dates discussed:    Additional Comments: CM met with pt in room to offer choice of home health agency.  Pt chooses [name] to render HHPT.   Referral called to [facility] rep, Santiago Glad with request for [place].  Pt states she has both 3n1 and rolling walker at home.  No other CM needs were communicated. Dellie Catholic, RN 05/06/2015, 1:01 PM

## 2015-05-06 NOTE — Progress Notes (Signed)
OT Cancellation Note  Patient Details Name: [name] [name] MRN: [id number] DOB: 15-Nov-1955   Cancelled Treatment:    Reason Eval/Treat Not Completed: OT screened, no needs identified, will sign off.  Pt has had several sxs and feels comfortable with adls and bathroom transfers.  Has 3:1 commode and grab bar in shower. Will sign off.  Darion Milewski 05/06/2015, 10:47 AM  Lesle Chris, OTR/L [phone] 05/06/2015

## 2015-05-06 NOTE — Progress Notes (Signed)
Physical Therapy Treatment Note    05/06/15 1500  PT Visit Information  Last PT Received On 05/06/15  Assistance Needed +1  History of Present Illness Pt is a 60 year old female s/p L TKA with hx of R TKA and revision, DM2, neuropathy  PT Time Calculation  PT Start Time (ACUTE ONLY) 1356  PT Stop Time (ACUTE ONLY) 1407  PT Time Calculation (min) (ACUTE ONLY) 11 min  Subjective Data  Subjective Pt ambulated in hallway again and progressing well.  Precautions  Precautions Knee  Restrictions  Other Position/Activity Restrictions WBAT  Pain Assessment  Pain Assessment 0-10  Pain Score 3  Pain Location L knee  Pain Descriptors / Indicators Sore  Pain Intervention(s) Limited activity within patient's tolerance;Monitored during session  Cognition  Arousal/Alertness Awake/alert  Behavior During Therapy WFL for tasks assessed/performed  Overall Cognitive Status Within Functional Limits for tasks assessed  Bed Mobility  General bed mobility comments pt up in recliner on arrival  Transfers  Overall transfer level Needs assistance  Equipment used Rolling walker (2 wheeled)  Transfers Sit to/from Stand  Sit to Stand Supervision  General transfer comment verbal cues for safe technique  Ambulation/Gait  Ambulation/Gait assistance Supervision  Ambulation Distance (Feet) 100 Feet  Assistive device Rolling walker (2 wheeled)  Gait Pattern/deviations Step-to pattern;Antalgic  General Gait Details verbal cues for sequence, RW positioning, posture  PT - End of Session  Activity Tolerance Patient tolerated treatment well  Patient left (in bathroom, aware to use pull cord, NT aware)  PT - Assessment/Plan  PT Plan Current plan remains appropriate  PT Frequency (ACUTE ONLY) 7X/week  Follow Up Recommendations Home health PT  PT equipment None recommended by PT  PT Goal Progression  Progress towards PT goals Progressing toward goals  PT General Charges  $$ ACUTE PT VISIT 1 Procedure  PT  Treatments  $Gait Training 8-22 mins   Carmelia Bake, PT, DPT 05/06/2015 Pager: [phone]

## 2015-05-07 ENCOUNTER — Other Ambulatory Visit: Payer: Self-pay

## 2015-05-07 DIAGNOSIS — E669 Obesity, unspecified: Secondary | ICD-10-CM | POA: Diagnosis present

## 2015-05-07 LAB — BASIC METABOLIC PANEL
Anion gap: 8 (ref 5–15)
BUN: 28 mg/dL — ABNORMAL HIGH (ref 6–20)
CO2: 26 mmol/L (ref 22–32)
Calcium: 9.7 mg/dL (ref 8.9–10.3)
Chloride: 107 mmol/L (ref 101–111)
Creatinine, Ser: 1.14 mg/dL — ABNORMAL HIGH (ref 0.44–1.00)
GFR calc Af Amer: 59 mL/min — ABNORMAL LOW (ref >60)
GFR calc non Af Amer: 51 mL/min — ABNORMAL LOW (ref >60)
Glucose, Bld: 126 mg/dL — ABNORMAL HIGH (ref 65–99)
Potassium: 4.9 mmol/L (ref 3.5–5.1)
Sodium: 141 mmol/L (ref 135–145)

## 2015-05-07 LAB — CBC
HCT: 33.7 % — ABNORMAL LOW (ref 36.0–46.0)
Hemoglobin: 11.7 g/dL — ABNORMAL LOW (ref 12.0–15.0)
MCH: 33.1 pg (ref 26.0–34.0)
MCHC: 34.7 g/dL (ref 30.0–36.0)
MCV: 95.5 fL (ref 78.0–100.0)
Platelets: 137 10*3/uL — ABNORMAL LOW (ref 150–400)
RBC: 3.53 MIL/uL — ABNORMAL LOW (ref 3.87–5.11)
RDW: 14.1 % (ref 11.5–15.5)
WBC: 8.6 10*3/uL (ref 4.0–10.5)

## 2015-05-07 LAB — GLUCOSE, CAPILLARY
Glucose-Capillary: 112 mg/dL — ABNORMAL HIGH (ref 65–99)
Glucose-Capillary: 134 mg/dL — ABNORMAL HIGH (ref 65–99)

## 2015-05-07 MED ORDER — DOCUSATE SODIUM 100 MG PO CAPS: 100.0000 mg | ORAL_CAPSULE | Freq: Two times a day (BID) | ORAL | Status: DC

## 2015-05-07 MED ORDER — FERROUS SULFATE 325 (65 FE) MG PO TABS: 325.0000 mg | ORAL_TABLET | Freq: Three times a day (TID) | ORAL | Status: DC

## 2015-05-07 MED ORDER — METHOCARBAMOL 500 MG PO TABS: 500.0000 mg | ORAL_TABLET | Freq: Four times a day (QID) | ORAL | Status: DC | PRN

## 2015-05-07 MED ORDER — OXYCODONE HCL 5 MG PO TABS: 5.0000 mg | ORAL_TABLET | ORAL | Status: DC | PRN

## 2015-05-07 MED ORDER — CELECOXIB 200 MG PO CAPS: 200.0000 mg | ORAL_CAPSULE | Freq: Two times a day (BID) | ORAL | Status: DC

## 2015-05-07 MED ORDER — POLYETHYLENE GLYCOL 3350 17 G PO PACK: 17.0000 g | PACK | Freq: Two times a day (BID) | ORAL | Status: DC

## 2015-05-07 MED ORDER — ACETAMINOPHEN 500 MG PO TABS
1000.0000 mg | ORAL_TABLET | Freq: Three times a day (TID) | ORAL | Status: DC
Start: 2015-05-07 — End: 2015-05-27

## 2015-05-07 MED ORDER — ASPIRIN 325 MG PO TBEC: 325.0000 mg | DELAYED_RELEASE_TABLET | Freq: Two times a day (BID) | ORAL | Status: DC

## 2015-05-07 NOTE — Progress Notes (Signed)
     Subjective: 2 Days Post-Op Procedure(s) (LRB): LEFT TOTAL KNEE ARTHROPLASTY (Left)   Patient reports pain as mild, pain controlled. Little bit of shooting pain occasionally on both the medial and lateral areas of the knee, otherwise good.  No events throughout the night. Ready to be discharged home.  Objective:   VITALS:   Filed Vitals:   05/06/15 2138 05/07/15 0510  BP: 107/62 121/106  Pulse: 78 89  Temp: 98.1 F (36.7 C) 98.8 F (37.1 C)  Resp: 15 16    Dorsiflexion/Plantar flexion intact Incision: dressing C/D/I No cellulitis present Compartment soft  LABS  Recent Labs  05/06/15 0408 05/07/15 0404  HGB 13.6 11.7*  HCT 38.9 33.7*  WBC 6.3 8.6  PLT 101* 137*     Recent Labs  05/06/15 0408 05/07/15 0404  NA 135 141  K 4.8 4.9  BUN 23* 28*  CREATININE 1.02* 1.14*  GLUCOSE 240* 126*     Assessment/Plan: 2 Days Post-Op Procedure(s) (LRB): LEFT TOTAL KNEE ARTHROPLASTY (Left) Up with therapy Discharge home with home health  Follow up in 2 weeks at [facility]. Follow up with OLIN,Alisse Tuite D in 2 weeks.  Contact information:  [facility] [address] [place] [id number]    Obese (BMI 30-39.9) Estimated body mass index is 36.95 kg/(m^2) as calculated from the following:   Height as of this encounter: 5\' 7"  (1.702 m).   Weight as of this encounter: 107.049 kg (236 lb). Patient also counseled that weight may inhibit the healing process Patient counseled that losing weight will help with future health issues       [name]. Mayia Megill   PAC  05/07/2015, 8:32 AM

## 2015-05-07 NOTE — Progress Notes (Signed)
Physical Therapy Treatment Patient Details Name: [name] [name] MRN: [id number] DOB: August 17, 1955 Today's Date: 06/04/2015    History of Present Illness Pt is a 60 year old female s/p L TKA with hx of R TKA and revision, DM2, neuropathy    PT Comments    Pt ambulated in hallway and performed LE exercises.  Pt ready for d/c home today.  Follow Up Recommendations  Home health PT     Equipment Recommendations  None recommended by PT    Recommendations for Other Services       Precautions / Restrictions Precautions Precautions: Knee Restrictions Other Position/Activity Restrictions: WBAT    Mobility  Bed Mobility Overal bed mobility: Modified Independent                Transfers Overall transfer level: Needs assistance Equipment used: Rolling walker (2 wheeled) Transfers: Sit to/from Stand Sit to Stand: Supervision            Ambulation/Gait Ambulation/Gait assistance: Supervision Ambulation Distance (Feet): 140 Feet Assistive device: Rolling walker (2 wheeled) Gait Pattern/deviations: Step-to pattern;Antalgic     General Gait Details: verbal cues for RW positioning   Stairs            Wheelchair Mobility    Modified Rankin (Stroke Patients Only)       Balance                                    Cognition Arousal/Alertness: Awake/alert Behavior During Therapy: WFL for tasks assessed/performed Overall Cognitive Status: Within Functional Limits for tasks assessed                      Exercises Total Joint Exercises Ankle Circles/Pumps: AROM;Both;10 reps Quad Sets: AROM;Both;10 reps Short Arc Quad: AROM;Left;10 reps Heel Slides: AAROM;Left;10 reps Hip ABduction/ADduction: AROM;Left;10 reps Straight Leg Raises: AROM;Left;15 reps    General Comments        Pertinent Vitals/Pain Pain Assessment: 0-10 Pain Score: 3  Pain Location: L knee Pain Descriptors / Indicators: Sore;Aching Pain Intervention(s):  Limited activity within patient's tolerance;Monitored during session;Ice applied    Home Living                      Prior Function            PT Goals (current goals can now be found in the care plan section) Progress towards PT goals: Progressing toward goals    Frequency  7X/week    PT Plan Current plan remains appropriate    Co-evaluation             End of Session   Activity Tolerance: Patient tolerated treatment well Patient left: in bed;with call bell/phone within reach     Time: 1051-1116 PT Time Calculation (min) (ACUTE ONLY): 25 min  Charges:  $Gait Training: 8-22 mins $Therapeutic Exercise: 8-22 mins                    G Codes:      Haadiya Frogge,KATHrine E 2015/06/04, 1:01 PM Carmelia Bake, PT, DPT 06-04-15 Pager: [phone]

## 2015-05-08 ENCOUNTER — Encounter: Payer: Self-pay | Admitting: Family Medicine

## 2015-05-11 NOTE — Discharge Summary (Signed)
Physician Discharge Summary  Patient ID: [name] [name] MRN: [id number] DOB/AGE: 61-Feb-1957 61 y.o.  Admit date: 05/05/2015 Discharge date: 05/07/2015   Procedures:  Procedure(s) (LRB): LEFT TOTAL KNEE ARTHROPLASTY (Left)  Attending Physician:  Dr. Paralee Cancel   Admission Diagnoses:   Left knee primary OA / pain  Discharge Diagnoses:  Principal Problem:   S/P left TKA Active Problems:   S/P knee replacement   Obese  Past Medical History  Diagnosis Date  . Depression   . Hypertension   . Hyperlipidemia   . Colon polyps   . Thyroid disease   . History of recurrent UTIs   . Anemia     menstrual related  . Diabetes mellitus 12/2009    type 2  . Alcohol problem drinking     rehab  . Acute meniscal tear of knee     Left knee  . Heart murmur   . Shortness of breath     with exertion   . Hypothyroidism   . Anxiety   . Diabetes mellitus, type II ([place])   . Liver disease   . 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 Evalluate for OSA (obstructive sleep apnea) 08/15/2013    No OSA on sleep study but may be underestimated.-never used cpap    . GERD (gastroesophageal reflux disease)     not currently taking.  . Arthritis     right ankle-neuropathy  . Neuromuscular disorder ([place])     neropathy bilateral feet.    HPI:    [name] [name], 60 y.o. female, has a history of pain and functional disability in the left knee due to arthritis and has failed non-surgical conservative treatments for greater than 12 weeks to include NSAID's and/or analgesics, corticosteriod injections, viscosupplementation injections and activity modification. Onset of symptoms was gradual, starting 1+ years ago with gradually worsening course since that time. The patient noted prior procedures on the knee to include arthroplasty on the right knee(s). Patient currently rates pain in the left knee(s) at 4 out of 10 with activity, more complaining about instability.  Patient has night pain, worsening of pain with activity and weight bearing, pain that interferes with activities of daily living, pain with passive range of motion, crepitus and joint swelling. Patient has evidence of periarticular osteophytes and joint space narrowing by imaging studies. There is no active infection. Risks, benefits and expectations were discussed with the patient. Risks including but not limited to the risk of anesthesia, blood clots, nerve damage, blood vessel damage, failure of the prosthesis, infection and up to and including death. Patient understand the risks, benefits and expectations and wishes to proceed with surgery.   PCP: [name], MD   Discharged Condition: good  Hospital Course:  Patient underwent the above stated procedure on 05/05/2015. Patient tolerated the procedure well and brought to the recovery room in good condition and subsequently to the floor.  POD #1 BP: 138/57 ; Pulse: 82 ; Temp: 98.5 F (36.9 C) ; Resp: 16 Patient reports pain as moderate, pain not fully controlled. She feels that the pain associated with this knee replacement is more than her last. Discussed increasing her analgesic medications, hopefully for a short time and then cut back. No other events throughout the night.  Dorsiflexion/plantar flexion intact, incision: dressing C/D/I, no cellulitis present and compartment soft.   LABS  Basename    HGB     13.6  HCT     38.9  POD #2  BP: 121/106 ; Pulse: 89 ; Temp: 98.8 F (37.1 C) ; Resp: 16 Patient reports pain as mild, pain controlled. Little bit of shooting pain occasionally on both the medial and lateral areas of the knee, otherwise good. No events throughout the night. Ready to be discharged home. Dorsiflexion/plantar flexion intact, incision: dressing C/D/I, no cellulitis present and compartment soft.   LABS  Basename    HGB     11.7  HCT     33.7    Discharge Exam: General appearance: alert, cooperative and no  distress Extremities: Homans sign is negative, no sign of DVT, no edema, redness or tenderness in the calves or thighs and no ulcers, gangrene or trophic changes  Disposition: Home with follow up in 2 weeks   Follow-up Information    Follow up with Mauri Pole, MD. Schedule an appointment as soon as possible for a visit in 2 weeks.   Specialty:  Orthopedic Surgery   Contact information:   [address] [place] [address] [phone]       Follow up with [facility].   Why:  Desma Mcgregor has been requested for your physical therapy   Contact information:   [address] [place] [address] [phone]       Discharge Instructions    Call MD / Call 911    Complete by:  As directed   If you experience chest pain or shortness of breath, CALL 911 and be transported to the hospital emergency room.  If you develope a fever above 101 F, pus (white drainage) or increased drainage or redness at the wound, or calf pain, call your surgeon's office.     Change dressing    Complete by:  As directed   Maintain surgical dressing until follow up in the clinic. If the edges start to pull up, may reinforce with tape. If the dressing is no longer working, may remove and cover with gauze and tape, but must keep the area dry and clean.  Call with any questions or concerns.     Constipation Prevention    Complete by:  As directed   Drink plenty of fluids.  Prune juice may be helpful.  You may use a stool softener, such as Colace (over the counter) 100 mg twice a day.  Use MiraLax (over the counter) for constipation as needed.     Diet - low sodium heart healthy    Complete by:  As directed      Discharge instructions    Complete by:  As directed   Maintain surgical dressing until follow up in the clinic. If the edges start to pull up, may reinforce with tape. If the dressing is no longer working, may remove and cover with gauze and tape, but must  keep the area dry and clean.  Follow up in 2 weeks at [facility]. Call with any questions or concerns.     Increase activity slowly as tolerated    Complete by:  As directed   Weight bearing as tolerated with assist device (walker, cane, etc) as directed, use it as long as suggested by your surgeon or therapist, typically at least 4-6 weeks.     TED hose    Complete by:  As directed   Use stockings (TED hose) for 2 weeks on both leg(s).  You may remove them at night for sleeping.  Medication List    TAKE these medications        acamprosate 333 MG tablet  Commonly known as:  CAMPRAL  Take 2 tablets (666 mg total) by mouth 3 (three) times daily with meals.     ACCU-CHEK FASTCLIX LANCETS Misc  Use to test blood sugars daily. Dx: E11.9     acetaminophen 500 MG tablet  Commonly known as:  TYLENOL  Take 2 tablets (1,000 mg total) by mouth every 8 (eight) hours.     aspirin 325 MG EC tablet  Take 1 tablet (325 mg total) by mouth 2 (two) times daily.     B COMPLEX 100 PO  Take 1 tablet by mouth daily.     buPROPion 300 MG 24 hr tablet  Commonly known as:  WELLBUTRIN XL  Take 1 tablet (300 mg total) by mouth daily.     celecoxib 200 MG capsule  Commonly known as:  CELEBREX  Take 1 capsule (200 mg total) by mouth every 12 (twelve) hours.     docusate sodium 100 MG capsule  Commonly known as:  COLACE  Take 1 capsule (100 mg total) by mouth 2 (two) times daily.     escitalopram 20 MG tablet  Commonly known as:  LEXAPRO  Take 1 tablet (20 mg total) by mouth daily.     ferrous sulfate 325 (65 FE) MG tablet  Take 1 tablet (325 mg total) by mouth 3 (three) times daily after meals.     glimepiride 4 MG tablet  Commonly known as:  AMARYL  TAKE 1 TABLET (4 MG TOTAL) BY MOUTH DAILY WITH BREAKFAST.     glucose blood test strip  Commonly known as:  ACCU-CHEK COMPACT STRIPS  Use to test blood sugars daily. Dx: E11.9     levothyroxine 88 MCG tablet    Commonly known as:  SYNTHROID, LEVOTHROID  Take 1 tablet (88 mcg total) by mouth daily.     methocarbamol 500 MG tablet  Commonly known as:  ROBAXIN  Take 1 tablet (500 mg total) by mouth every 6 (six) hours as needed for muscle spasms.     oxyCODONE 5 MG immediate release tablet  Commonly known as:  Oxy IR/ROXICODONE  Take 1-3 tablets (5-15 mg total) by mouth every 4 (four) hours as needed for severe pain.     polyethylene glycol packet  Commonly known as:  MIRALAX / GLYCOLAX  Take 17 g by mouth 2 (two) times daily.     SitaGLIPtin-MetFORMIN HCl 50-1000 MG Tb24  Commonly known as:  JANUMET XR  Take 2 tablets by mouth daily.     thiamine 50 MG tablet  Commonly known as:  VITAMIN B-1  Take 1 tablet (50 mg total) by mouth every morning.     traZODone 50 MG tablet  Commonly known as:  DESYREL  TAKE 1 TABLET AT BEDTIME AS NEEDED,MAY REPEAT 1 TIME IF NEEDED FOR SLEEP     zinc gluconate 50 MG tablet  Take 50 mg by mouth daily.         Signed: [name]. Aristide Waggle   PA-C  05/11/2015, 9:33 PM

## 2015-05-12 ENCOUNTER — Ambulatory Visit (HOSPITAL_COMMUNITY): Payer: No Typology Code available for payment source | Admitting: Licensed Clinical Social Worker

## 2015-05-12 ENCOUNTER — Encounter: Payer: Self-pay | Admitting: Family Medicine

## 2015-05-13 ENCOUNTER — Other Ambulatory Visit (HOSPITAL_COMMUNITY): Payer: No Typology Code available for payment source

## 2015-05-15 ENCOUNTER — Other Ambulatory Visit: Payer: Self-pay | Admitting: Family Medicine

## 2015-05-19 ENCOUNTER — Ambulatory Visit (HOSPITAL_COMMUNITY): Payer: Self-pay | Admitting: Licensed Clinical Social Worker

## 2015-05-26 ENCOUNTER — Ambulatory Visit (HOSPITAL_COMMUNITY): Payer: Self-pay | Admitting: Licensed Clinical Social Worker

## 2015-05-27 ENCOUNTER — Ambulatory Visit: Payer: No Typology Code available for payment source | Attending: Physician Assistant

## 2015-05-27 ENCOUNTER — Ambulatory Visit (INDEPENDENT_AMBULATORY_CARE_PROVIDER_SITE_OTHER): Payer: No Typology Code available for payment source | Admitting: Family Medicine

## 2015-05-27 ENCOUNTER — Encounter: Payer: Self-pay | Admitting: Family Medicine

## 2015-05-27 VITALS — BP 140/64 | HR 87 | Temp 98.9°F | Wt 242.0 lb

## 2015-05-27 DIAGNOSIS — R1013 Epigastric pain: Secondary | ICD-10-CM

## 2015-05-27 DIAGNOSIS — R262 Difficulty in walking, not elsewhere classified: Secondary | ICD-10-CM

## 2015-05-27 DIAGNOSIS — M25562 Pain in left knee: Secondary | ICD-10-CM

## 2015-05-27 DIAGNOSIS — M25662 Stiffness of left knee, not elsewhere classified: Secondary | ICD-10-CM

## 2015-05-27 DIAGNOSIS — M6281 Muscle weakness (generalized): Secondary | ICD-10-CM

## 2015-05-27 DIAGNOSIS — R600 Localized edema: Secondary | ICD-10-CM | POA: Diagnosis present

## 2015-05-27 LAB — CBC WITH DIFFERENTIAL/PLATELET
Basophils Absolute: 0 10*3/uL (ref 0.0–0.1)
Basophils Relative: 0.4 % (ref 0.0–3.0)
Eosinophils Absolute: 0.1 10*3/uL (ref 0.0–0.7)
Eosinophils Relative: 1.9 % (ref 0.0–5.0)
HCT: 38.7 % (ref 36.0–46.0)
Hemoglobin: 13.2 g/dL (ref 12.0–15.0)
Lymphocytes Relative: 33.5 % (ref 12.0–46.0)
Lymphs Abs: 2.1 10*3/uL (ref 0.7–4.0)
MCHC: 34 g/dL (ref 30.0–36.0)
MCV: 98.5 fl (ref 78.0–100.0)
Monocytes Absolute: 0.7 10*3/uL (ref 0.1–1.0)
Monocytes Relative: 10.5 % (ref 3.0–12.0)
Neutro Abs: 3.4 10*3/uL (ref 1.4–7.7)
Neutrophils Relative %: 53.7 % (ref 43.0–77.0)
Platelets: 260 10*3/uL (ref 150.0–400.0)
RBC: 3.93 Mil/uL (ref 3.87–5.11)
RDW: 15.6 % — ABNORMAL HIGH (ref 11.5–15.5)
WBC: 6.2 10*3/uL (ref 4.0–10.5)

## 2015-05-27 LAB — COMPREHENSIVE METABOLIC PANEL
ALT: 12 U/L (ref 0–35)
AST: 19 U/L (ref 0–37)
Albumin: 3.9 g/dL (ref 3.5–5.2)
Alkaline Phosphatase: 80 U/L (ref 39–117)
BUN: 25 mg/dL — ABNORMAL HIGH (ref 6–23)
CO2: 29 mEq/L (ref 19–32)
Calcium: 10 mg/dL (ref 8.4–10.5)
Chloride: 100 mEq/L (ref 96–112)
Creatinine, Ser: 1.09 mg/dL (ref 0.40–1.20)
GFR: 54.28 mL/min — ABNORMAL LOW (ref >60.00)
Glucose, Bld: 169 mg/dL — ABNORMAL HIGH (ref 70–99)
Potassium: 4.2 mEq/L (ref 3.5–5.1)
Sodium: 136 mEq/L (ref 135–145)
Total Bilirubin: 1.5 mg/dL — ABNORMAL HIGH (ref 0.2–1.2)
Total Protein: 6.7 g/dL (ref 6.0–8.3)

## 2015-05-27 LAB — LIPASE: Lipase: 17 U/L (ref 11.0–59.0)

## 2015-05-27 LAB — PROTIME-INR
INR: 1.2 ratio — ABNORMAL HIGH (ref 0.8–1.0)
Prothrombin Time: 13 s (ref 9.6–13.1)

## 2015-05-27 NOTE — Progress Notes (Signed)
[name], MD  Subjective:  [name] [name] is a 60 y.o. year old very pleasant female patient who presents for/with See problem oriented charting ROS- no jaundice, no fever, chills. No chest pain or shortness of breath  Past Medical History-  Patient Active Problem List   Diagnosis Date Noted  . Alcoholic fatty liver [id number]    Priority: High  . Type II diabetes mellitus, well controlled ([place]) 06/23/2010    Priority: High  . Alcohol use disorder, severe, dependence ([place]) 06/23/2010    Priority: High  . Chronic depression 06/23/2010    Priority: High  . Hypothyroid 07/21/2010    Priority: Medium  . HTN (hypertension) 06/23/2010    Priority: Medium  . Hyperlipidemia 06/23/2010    Priority: Medium  . Former smoker 07/10/2014    Priority: Low  . Osteoarthritis 05/16/2014    Priority: Low  . Chronic post-traumatic stress disorder (PTSD) 05/02/2014    Priority: Low  . Sleep disorder 05/02/2014    Priority: Low  . Family history of alcoholism 05/02/2014    Priority: Low  . Macrocytosis 04/19/2014    Priority: Low  . Fatigue 08/15/2013    Priority: Low  . Thyroid nodule 09/17/2012    Priority: Low  . Obesity (BMI 30-39.9) 06/23/2010    Priority: Low  . History of colonic polyps 04/12/2010    Priority: Low  . Obese 05/07/2015  . S/P left TKA 05/05/2015  . S/P knee replacement 05/05/2015  . Exertional shortness of breath 06/07/2014  . Chest pain 06/07/2014    Medications- reviewed and updated Current Outpatient Prescriptions  Medication Sig Dispense Refill  . ACCU-CHEK FASTCLIX LANCETS MISC Use to test blood sugars daily. Dx: E11.9 100 each 11  . B Complex Vitamins (B COMPLEX 100 PO) Take 1 tablet by mouth daily.     Marland Kitchen buPROPion (WELLBUTRIN XL) 300 MG 24 hr tablet Take 1 tablet (300 mg total) by mouth daily. 90 tablet 3  . celecoxib (CELEBREX) 200 MG capsule Take 1 capsule (200 mg total) by mouth every 12 (twelve) hours. 60 capsule 0  . escitalopram (LEXAPRO) 20  MG tablet Take 1 tablet (20 mg total) by mouth daily. 90 tablet 3  . ferrous sulfate 325 (65 FE) MG tablet Take 1 tablet (325 mg total) by mouth 3 (three) times daily after meals.  3  . glucose blood (ACCU-CHEK COMPACT STRIPS) test strip Use to test blood sugars daily. Dx: E11.9 100 each 12  . levothyroxine (SYNTHROID, LEVOTHROID) 88 MCG tablet Take 1 tablet (88 mcg total) by mouth daily. 90 tablet 3  . methocarbamol (ROBAXIN) 500 MG tablet Take 1 tablet (500 mg total) by mouth every 6 (six) hours as needed for muscle spasms. 50 tablet 0  . polyethylene glycol (MIRALAX / GLYCOLAX) packet Take 17 g by mouth 2 (two) times daily. 14 each 0  . SitaGLIPtin-MetFORMIN HCl (JANUMET XR) 50-1000 MG TB24 Take 2 tablets by mouth daily. 180 tablet 1  . thiamine (VITAMIN B-1) 50 MG tablet TAKE 1 TABLET BY MOUTH EVERY MORNING 100 tablet 1  . traZODone (DESYREL) 50 MG tablet TAKE 1 TABLET AT BEDTIME AS NEEDED,MAY REPEAT 1 TIME IF NEEDED FOR SLEEP 90 tablet 3  . zinc gluconate 50 MG tablet Take 50 mg by mouth daily.    Marland Kitchen oxyCODONE (OXY IR/ROXICODONE) 5 MG immediate release tablet Take 1-3 tablets (5-15 mg total) by mouth every 4 (four) hours as needed for severe pain. 120 tablet 0   No current facility-administered medications  for this visit.    Objective: BP 140/64 mmHg  Pulse 87  Temp(Src) 98.9 F (37.2 C)  Wt 242 lb (109.77 kg) Gen: NAD, resting comfortably No obvious jaundice or scleral icterus CV: RRR no murmurs rubs or gallops Lungs: CTAB no crackles, wheeze, rhonchi Abdomen:  Mild abdominal epigastric abdominal pain with palpation otherwise soft/nontender/nondistended/normal bowel sounds. No rebound or guarding.  Ext: no edema Skin: warm, dry, no rash Neuro: grossly normal, moves all extremities  Assessment/Plan:   Epigastric abdominal pain S: For at least a week-Epigastric abdominal pain. In the morning wakes up and feels nauseous and sometimes throws up. Seems to get better with eating.  Gets worse when she doesn't eat. Post surgery was on oxycodone and 1000mg  tylenol was taking every 4 hours for about a week (usually 5000mg  per day- stopped well over a week ago. Does not take reflux medicine.  Ros- no chest pain, shortness of breath, fever, chills, having regular BM with miralax every other day A/P: Symptoms sound like duodenal ulcer. Will treat with prilosec 20mg  for 4-8 weeks with 4 week follow up to decide on extension. No gallbladder. Given higher tylenol ingestion with history of alcoholic fatty liver, will check CMP and PT/INR. Spoke with poison control at [phone], no recommendation for tylenol level this far out. We will follow up with them when labs available. If worsening symptoms- return to care  4 week. Return precautions advised.   Orders Placed This Encounter  Procedures  . Comprehensive metabolic panel    [facility]  . Protime-INR  . CBC with Differential  . Lipase

## 2015-05-27 NOTE — Patient Instructions (Signed)
Labs before you go  Take prilosec/omeprazole 20mg  for 1 month- if symptoms completely resolved may stop- if improving do for 2 months. Perhaps let's just schedule 1 month follow up to see how you are doing  If you have new or worsening symptoms return to care

## 2015-05-27 NOTE — Therapy (Signed)
[facility] [address] [place], [place], [address] Phone: [phone]   Fax:  [phone]  Physical Therapy Evaluation  Patient Details  Name: [name] [name] MRN: [id number] Date of Birth: [birth year] Referring Provider: Gerrit Halls PAC/ Paralee Cancel, MD  Encounter Date: 05/27/2015      PT End of Session - 05/27/15 1301    Visit Number 1   Date for PT Re-Evaluation 07/22/15   PT Start Time 1233   PT Stop Time [id number]   PT Time Calculation (min) 44 min   Behavior During Therapy [facility] for tasks assessed/performed      Past Medical History  Diagnosis Date  . Depression   . Hypertension   . Hyperlipidemia   . Colon polyps   . Thyroid disease   . History of recurrent UTIs   . Anemia     menstrual related  . Diabetes mellitus 12/2009    type 2  . Alcohol problem drinking     rehab  . Acute meniscal tear of knee     Left knee  . Heart murmur   . Shortness of breath     with exertion   . Hypothyroidism   . Anxiety   . Diabetes mellitus, type II ([place])   . Liver disease   . 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 Evalluate for OSA (obstructive sleep apnea) 08/15/2013    No OSA on sleep study but may be underestimated.-never used cpap    . GERD (gastroesophageal reflux disease)     not currently taking.  . Arthritis     right ankle-neuropathy  . Neuromuscular disorder ([place])     neropathy bilateral feet.    Past Surgical History  Procedure Laterality Date  . Total knee arthroplasty  2009    right  . Cesarean section  1987  . Cholecystectomy    . Tubal ligation  1987  . Tonsillectomy    . Adenoidectomy    . Right rotator cuff surgery     . Right ankle surgery       x 2- no retained hardware  . Total knee revision Right 12/16/2013    Procedure: RIGHT TOTAL KNEE REVISION POLY EXCHANGE;  Surgeon: Mauri Pole, MD;  Location: [facility];  Service: Orthopedics;   Laterality: Right;  . Mass excision Left 12/16/2013    Procedure: EXCISION LEFT  DISTAL THIGH MASS;  Surgeon: Mauri Pole, MD;  Location: [facility];  Service: Orthopedics;  Laterality: Left;  . Breast surgery Right     lumpectomy-benign  . Total knee arthroplasty Left 05/05/2015    Procedure: LEFT TOTAL KNEE ARTHROPLASTY;  Surgeon: Paralee Cancel, MD;  Location: [facility];  Service: Orthopedics;  Laterality: Left;    There were no vitals filed for this visit.  Visit Diagnosis:  Pain in left knee - Plan: PT plan of care cert/re-cert  Difficulty in walking, not elsewhere classified - Plan: PT plan of care cert/re-cert  Localized edema - Plan: PT plan of care cert/re-cert  Stiffness of left knee, not elsewhere classified - Plan: PT plan of care cert/re-cert  Muscle weakness (generalized) - Plan: PT plan of care cert/re-cert      Subjective Assessment - 05/27/15 1236    Subjective Pt presents to PT s/p Lt total knee replacement 05/05/15 due to OA. Pt had home health PT ending 05/22/15.    Pertinent History Rt TKA- 2009, Lt TKA- 05/05/15  Limitations Sitting;Standing;Walking   How long can you sit comfortably? less than 1 hour without leg elevated   How long can you stand comfortably? 20 minutes    How long can you walk comfortably? 15 minutes   Patient Stated Goals improve gait, reduce Lt knee pain, improve AROM and strength   Currently in Pain? Yes   Pain Score 2   up to 4/10 after exercises   Pain Location Knee   Pain Orientation Left   Pain Descriptors / Indicators Aching;Tightness;Shooting   Pain Type Surgical pain   Pain Onset 1 to 4 weeks ago   Pain Frequency Constant   Aggravating Factors  exercises, sitting without elevation, standing/walking   Pain Relieving Factors ice, elevation, rest            [facility] PT Assessment - 05/27/15 0001    Assessment   Medical Diagnosis s/p Lt TKA   Referring Provider Catalina Gravel, MD   Onset Date/Surgical Date  05/05/15   Next MD Visit 3 weeks   Prior Therapy home health PT   Precautions   Precautions None   Restrictions   Weight Bearing Restrictions No   Balance Screen   Has the patient fallen in the past 6 months No   Has the patient had a decrease in activity level because of a fear of falling?  No   Is the patient reluctant to leave their home because of a fear of falling?  No   Home Environment   Living Environment Private residence   Living Arrangements Spouse/significant other   Type of [place] Access Level entry   Home Layout One level   Prior Function   Level of Independence Independent   Vocation Retired   Leisure none   Cognition   Overall Cognitive Status Within Functional Limits for tasks assessed   Observation/Other Assessments   Focus on Therapeutic Outcomes (FOTO)  68% limitation   ROM / Strength   AROM / PROM / Strength AROM;PROM;Strength   AROM   Overall AROM  Deficits   AROM Assessment Site Knee   Right/Left Knee Right;Left   Right Knee Extension 3   Right Knee Flexion 110   Left Knee Extension 10   Left Knee Flexion 97   PROM   Overall PROM  Deficits   PROM Assessment Site Knee   Right/Left Knee Left   Left Knee Extension 7   Left Knee Flexion 99   Strength   Overall Strength Deficits   Overall Strength Comments Bil hip 4+/5, knee 4+/5 on Lt, 5/5 on Rt   Palpation   Patella mobility reduced patellar mobility in all directions on the Lt with pain.     Palpation comment Mid patellar edema: Lt 19.5 inches, Rt 18.5 inches.  Incision is 5.5 inches.  Reduced mobility of incision with scabbing present.  Tenderness to palpation over distal quad and hamstring insertions.   Ambulation/Gait   Ambulation/Gait Yes   Ambulation/Gait Assistance 6: Modified independent (Device/Increase time)   Ambulation Distance (Feet) 100 Feet   Gait Pattern Step-through pattern;Decreased stance time - left;Decreased step length - left                   [facility]  Adult PT Treatment/Exercise - 05/27/15 0001    Exercises   Exercises Knee/Hip   Knee/Hip Exercises: Aerobic   Stationary Bike Level 1, full revolutions x 6 minutes   Modalities   Modalities Vasopneumatic   Vasopneumatic  Number Minutes Vasopneumatic  10 minutes   Vasopnuematic Location  Knee   Vasopneumatic Pressure Medium   Vasopneumatic Temperature  snowflakes                PT Education - 05/27/15 1259    Education provided Yes   Education Details verbal review of all exercises issued from [place]   Person(s) Educated Patient   Methods Explanation   Comprehension Verbalized understanding          PT Short Term Goals - 05/27/15 1308    PT SHORT TERM GOAL #1   Title be independent in initial HEP   Time 8   Period Weeks   Status New   PT SHORT TERM GOAL #2   Title demonstrate Lt knee AROM extension to lacking < or = to 5 degrees to normalize gait pattern   Time 8   Period Weeks   Status New   PT SHORT TERM GOAL #3   Title demonstrate Lt knee AROM flexion to > or = to 105 to allow for comfort with sitting without substitution   Time 8   Period Weeks   Status New   PT SHORT TERM GOAL #4   Title demonstrate symmetry with ambulation on level surface   Time 8   Period Weeks   Status New           PT Long Term Goals - 05/27/15 1230    PT LONG TERM GOAL #1   Title be independent in advanced HEP   Time 8   Period Weeks   Status New   PT LONG TERM GOAL #2   Title reduce FOTO to < or = to 45% limitation   Time 8   Period Weeks   Status New   PT LONG TERM GOAL #3   Title improve Lt LE strength and endurance walk for 30-45 minutes in the community without need to rest   Time 8   Period Weeks   Status New   PT LONG TERM GOAL #4   Title demonstrate Lt knee AROM flexion to 110 degrees to improve ability to squat   Time 8   Period Weeks   Status New   PT LONG TERM GOAL #5   Title ascend and descend steps and curbs with steo-over-step gait   Time 8    Period Weeks   Status New   Additional Long Term Goals   Additional Long Term Goals Yes   PT LONG TERM GOAL #6   Title report a 60% reduction in Lt knee pain with standing and walking   Time 8   Period Weeks   Status New               Plan - 05/27/15 1305    Clinical Impression Statement Pt presents to PT s/p Lt TKA 05/05/15.  Pt demonstrates limited AROM, strength, edema in the Lt knee and gait abnormality.  Pt will benefit from skilled PT for functional strength training, AROM/strength progression, manual/soft tissue work, edema management and gait training to allow for return to prior level of function.     Pt will benefit from skilled therapeutic intervention in order to improve on the following deficits Abnormal gait;Decreased range of motion;Difficulty walking;Decreased activity tolerance;Pain;Increased edema;Decreased strength;Impaired flexibility;Decreased scar mobility;Decreased mobility   Rehab Potential Good   PT Frequency 3x / week   PT Duration 8 weeks   PT Treatment/Interventions ADLs/Self Care Home Management;Cryotherapy;Electrical Stimulation;Moist Heat;Therapeutic exercise;Therapeutic activities;Functional mobility training;Stair training;Gait training;Ultrasound;Neuromuscular  re-education;Patient/family education;Manual techniques;Vasopneumatic Device;Taping;Passive range of motion;Scar mobilization   PT Next Visit Plan Edema management, Lt knee AROM and strength, gait, flexibility   Consulted and Agree with Plan of Care Patient         Problem List Patient Active Problem List   Diagnosis Date Noted  . Obese 05/07/2015  . S/P left TKA 05/05/2015  . S/P knee replacement 05/05/2015  . Former smoker 07/10/2014  . Exertional shortness of breath 06/07/2014  . Chest pain 06/07/2014  . Osteoarthritis 05/16/2014  . Alcoholic fatty liver [id number]  . Chronic post-traumatic stress disorder (PTSD) 05/02/2014  . Sleep disorder 05/02/2014  . Family history of  alcoholism 05/02/2014  . Macrocytosis 04/19/2014  . Fatigue 08/15/2013  . Thyroid nodule 09/17/2012  . Hypothyroid 07/21/2010  . Type II diabetes mellitus, well controlled ([place]) 06/23/2010  . HTN (hypertension) 06/23/2010  . Alcohol use disorder, severe, dependence ([place]) 06/23/2010  . Hyperlipidemia 06/23/2010  . Obesity (BMI 30-39.9) 06/23/2010  . Chronic depression 06/23/2010  . History of colonic polyps 04/12/2010   Sigurd Sos, PT 05/27/2015 1:13 PM  [facility] [address], [place], [place], [address] Phone: [phone]   Fax:  [phone]  Name: Cylah Friedt MRN: [id number] Date of Birth: [birth year]

## 2015-05-28 ENCOUNTER — Ambulatory Visit: Payer: No Typology Code available for payment source | Admitting: Physical Therapy

## 2015-05-28 ENCOUNTER — Encounter: Payer: Self-pay | Admitting: Physical Therapy

## 2015-05-28 DIAGNOSIS — R262 Difficulty in walking, not elsewhere classified: Secondary | ICD-10-CM

## 2015-05-28 DIAGNOSIS — M6281 Muscle weakness (generalized): Secondary | ICD-10-CM

## 2015-05-28 DIAGNOSIS — M25662 Stiffness of left knee, not elsewhere classified: Secondary | ICD-10-CM

## 2015-05-28 DIAGNOSIS — R600 Localized edema: Secondary | ICD-10-CM

## 2015-05-28 DIAGNOSIS — M25562 Pain in left knee: Secondary | ICD-10-CM

## 2015-05-28 NOTE — Therapy (Unsigned)
[facility] [address], [place], [place], [address] Phone: [phone]   Fax:  [phone]  Physical Therapy Treatment  Patient Details  Name: [name] [name] MRN: [id number] Date of Birth: 1955-10-10 Referring Provider: Gerrit Halls PAC/ Paralee Cancel, MD  Encounter Date: 05/28/2015      PT End of Session - 05/28/15 0856    Visit Number 2   Date for PT Re-Evaluation 07/22/15   PT Start Time 0845   PT Stop Time 0947   PT Time Calculation (min) 62 min   Activity Tolerance Patient tolerated treatment well   Behavior During Therapy [facility] for tasks assessed/performed      Past Medical History  Diagnosis Date  . Depression   . Hypertension   . Hyperlipidemia   . Colon polyps   . Thyroid disease   . History of recurrent UTIs   . Anemia     menstrual related  . Diabetes mellitus 12/2009    type 2  . Alcohol problem drinking     rehab  . Acute meniscal tear of knee     Left knee  . Heart murmur   . Shortness of breath     with exertion   . Hypothyroidism   . Anxiety   . Diabetes mellitus, type II ([place])   . Liver disease   . 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 Evalluate for OSA (obstructive sleep apnea) 08/15/2013    No OSA on sleep study but may be underestimated.-never used cpap    . GERD (gastroesophageal reflux disease)     not currently taking.  . Arthritis     right ankle-neuropathy  . Neuromuscular disorder ([place])     neropathy bilateral feet.    Past Surgical History  Procedure Laterality Date  . Total knee arthroplasty  2009    right  . Cesarean section  1987  . Cholecystectomy    . Tubal ligation  1987  . Tonsillectomy    . Adenoidectomy    . Right rotator cuff surgery     . Right ankle surgery       x 2- no retained hardware  . Total knee revision Right 12/16/2013    Procedure: RIGHT TOTAL KNEE REVISION POLY EXCHANGE;  Surgeon: Mauri Pole, MD;  Location: [facility];  Service: Orthopedics;  Laterality: Right;  . Mass excision Left 12/16/2013    Procedure: EXCISION LEFT  DISTAL THIGH MASS;  Surgeon: Mauri Pole, MD;  Location: [facility];  Service: Orthopedics;  Laterality: Left;  . Breast surgery Right     lumpectomy-benign  . Total knee arthroplasty Left 05/05/2015    Procedure: LEFT TOTAL KNEE ARTHROPLASTY;  Surgeon: Paralee Cancel, MD;  [facility]: [facility];  Service: Orthopedics;  Laterality: Left;    There were no vitals filed for this visit.  Visit Diagnosis:  Pain in left knee  Difficulty in walking, not elsewhere classified  Localized edema  Stiffness of left knee, not elsewhere classified  Muscle weakness (generalized)      Subjective Assessment - 05/28/15 0854    Subjective Pt without complains of pain this morning premedicated, but when the day goes on the pain is up to 3/10. Pt with complains of swelling, decreased ROM, limited strength and endurance.    Pertinent History Rt TKA- 2009, Lt TKA- 05/05/15   Limitations Sitting;Standing;Walking   How long can you sit comfortably? less than 1 hour without  leg elevated   How long can you stand comfortably? 20 minutes    How long can you walk comfortably? 15 minutes   Patient Stated Goals improve gait, reduce Lt knee pain, improve AROM and strength   Currently in Pain? No/denies   Multiple Pain Sites No                         [facility] Adult PT Treatment/Exercise - 05/28/15 0001    Exercises   Exercises Knee/Hip   Knee/Hip Exercises: Stretches   Active Hamstring Stretch Left;3 reps;20 seconds  in sitting   Knee/Hip Exercises: Aerobic   Stationary Bike Level 1, full revolutions x 10 minutes  initially   Knee/Hip Exercises: Machines for Strengthening   Total Gym Leg Press St #9, 60# B LE, 35# Lt LE 3 x 10  increase weight next visit   Knee/Hip Exercises: Standing   Lateral Step Up Left;2 sets;10 reps;Hand Hold: 2;Step Height: 6"   Forward Step  Up Left;2 sets;10 reps;Hand Hold: 2;Step Height: 6"   Rebounder 3 directions x 1 min each with B UE support   Modalities   Modalities Vasopneumatic   Vasopneumatic   Number Minutes Vasopneumatic  15 minutes   Vasopnuematic Location  Knee   Vasopneumatic Pressure Medium   Vasopneumatic Temperature  snowflakes                PT Education - 05/27/15 1259    Education provided Yes   Education Details verbal review of all exercises issued from [place]   Person(s) Educated Patient   Methods Explanation   Comprehension Verbalized understanding          PT Short Term Goals - 05/27/15 1308    PT SHORT TERM GOAL #1   Title be independent in initial HEP   Time 8   Period Weeks   Status New   PT SHORT TERM GOAL #2   Title demonstrate Lt knee AROM extension to lacking < or = to 5 degrees to normalize gait pattern   Time 8   Period Weeks   Status New   PT SHORT TERM GOAL #3   Title demonstrate Lt knee AROM flexion to > or = to 105 to allow for comfort with sitting without substitution   Time 8   Period Weeks   Status New   PT SHORT TERM GOAL #4   Title demonstrate symmetry with ambulation on level surface   Time 8   Period Weeks   Status New           PT Long Term Goals - 05/27/15 1230    PT LONG TERM GOAL #1   Title be independent in advanced HEP   Time 8   Period Weeks   Status New   PT LONG TERM GOAL #2   Title reduce FOTO to < or = to 45% limitation   Time 8   Period Weeks   Status New   PT LONG TERM GOAL #3   Title improve Lt LE strength and endurance walk for 30-45 minutes in the community without need to rest   Time 8   Period Weeks   Status New   PT LONG TERM GOAL #4   Title demonstrate Lt knee AROM flexion to 110 degrees to improve ability to squat   Time 8   Period Weeks   Status New   PT LONG TERM GOAL #5   Title ascend and descend steps and  curbs with steo-over-step gait   Time 8   Period Weeks   Status New   Additional Long Term Goals    Additional Long Term Goals Yes   PT LONG TERM GOAL #6   Title report a 60% reduction in Lt knee pain with standing and walking   Time 8   Period Weeks   Status New               Plan - 05/28/15 0857    Clinical Impression Statement Pt with well healing scar, pt advised to use Vit E to boost the healing. Pt initially with rocking at the bike but after 5 min able for full revolution. Pt will benefit from skilled PT for functional strength training, AROM, strength progression, manual/softtissue, edema mangament and gait training to allow for return to prior level of function.    Pt will benefit from skilled therapeutic intervention in order to improve on the following deficits Abnormal gait;Decreased range of motion;Difficulty walking;Decreased activity tolerance;Pain;Increased edema;Decreased strength;Impaired flexibility;Decreased scar mobility;Decreased mobility   Rehab Potential Good   PT Frequency 3x / week   PT Duration 8 weeks   PT Treatment/Interventions ADLs/Self Care Home Management;Cryotherapy;Electrical Stimulation;Moist Heat;Therapeutic exercise;Therapeutic activities;Functional mobility training;Stair training;Gait training;Ultrasound;Neuromuscular re-education;Patient/family education;Manual techniques;Vasopneumatic Device;Taping;Passive range of motion;Scar mobilization   PT Next Visit Plan Edema management, Lt knee AROM and strength, gait, flexibility   Consulted and Agree with Plan of Care Patient        Problem List Patient Active Problem List   Diagnosis Date Noted  . Obese 05/07/2015  . S/P left TKA 05/05/2015  . S/P knee replacement 05/05/2015  . Former smoker 07/10/2014  . Exertional shortness of breath 06/07/2014  . Chest pain 06/07/2014  . Osteoarthritis 05/16/2014  . Alcoholic fatty liver Q000111Q  . Chronic post-traumatic stress disorder (PTSD) 05/02/2014  . Sleep disorder 05/02/2014  . Family history of alcoholism 05/02/2014  . Macrocytosis  04/19/2014  . Fatigue 08/15/2013  . Thyroid nodule 09/17/2012  . Hypothyroid 07/21/2010  . Type II diabetes mellitus, well controlled ([place]) 06/23/2010  . HTN (hypertension) 06/23/2010  . Alcohol use disorder, severe, dependence ([place]) 06/23/2010  . Hyperlipidemia 06/23/2010  . Obesity (BMI 30-39.9) 06/23/2010  . Chronic depression 06/23/2010  . History of colonic polyps 04/12/2010    NAUMANN-HOUEGNIFIO,[name] [name] PTA 05/28/2015, 9:31 AM  [facility] [address] [place], [place], [address] Phone: [phone]   Fax:  [phone]  Name: Estefana Okabe MRN: [id number] Date of Birth: 05-04-1955

## 2015-05-28 NOTE — Patient Instructions (Signed)
  HAMSTRING STRETCH WITH TOWEL  While lying down on your back, hook a towel or strap under  your foot and draw up your leg until a stretch is felt under your leg. calf area.  Keep your knee in a straightened position during the stretch.  Hold 20 seconds, perform 3 repetitions.  3 times a day.    [facility] [address], [address] [place], [place] [address] Phone # [phone] Fax [phone]    

## 2015-06-02 ENCOUNTER — Ambulatory Visit (HOSPITAL_COMMUNITY): Payer: Self-pay | Admitting: Licensed Clinical Social Worker

## 2015-06-02 ENCOUNTER — Ambulatory Visit: Payer: No Typology Code available for payment source | Admitting: Physical Therapy

## 2015-06-02 DIAGNOSIS — M25562 Pain in left knee: Secondary | ICD-10-CM | POA: Diagnosis not present

## 2015-06-02 DIAGNOSIS — M6281 Muscle weakness (generalized): Secondary | ICD-10-CM

## 2015-06-02 DIAGNOSIS — R262 Difficulty in walking, not elsewhere classified: Secondary | ICD-10-CM

## 2015-06-02 DIAGNOSIS — R600 Localized edema: Secondary | ICD-10-CM

## 2015-06-02 DIAGNOSIS — M25662 Stiffness of left knee, not elsewhere classified: Secondary | ICD-10-CM

## 2015-06-02 NOTE — Therapy (Signed)
[facility] [address], [place], [place], [address] Phone: [phone]   Fax:  [phone]  Physical Therapy Treatment  Patient Details  Name: [name] [name] MRN: [id number] Date of Birth: 05-Oct-1955 Referring Provider: Gerrit Halls PAC/ Paralee Cancel, MD  Encounter Date: 06/02/2015      PT End of Session - 06/02/15 1011    Visit Number 3   Date for PT Re-Evaluation 07/22/15   PT Start Time 0930   PT Stop Time 1030   PT Time Calculation (min) 60 min   Activity Tolerance Patient tolerated treatment well      Past Medical History  Diagnosis Date  . Depression   . Hypertension   . Hyperlipidemia   . Colon polyps   . Thyroid disease   . History of recurrent UTIs   . Anemia     menstrual related  . Diabetes mellitus 12/2009    type 2  . Alcohol problem drinking     rehab  . Acute meniscal tear of knee     Left knee  . Heart murmur   . Shortness of breath     with exertion   . Hypothyroidism   . Anxiety   . Diabetes mellitus, type II ([place])   . Liver disease   . 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 Evalluate for OSA (obstructive sleep apnea) 08/15/2013    No OSA on sleep study but may be underestimated.-never used cpap    . GERD (gastroesophageal reflux disease)     not currently taking.  . Arthritis     right ankle-neuropathy  . Neuromuscular disorder ([place])     neropathy bilateral feet.    Past Surgical History  Procedure Laterality Date  . Total knee arthroplasty  2009    right  . Cesarean section  1987  . Cholecystectomy    . Tubal ligation  1987  . Tonsillectomy    . Adenoidectomy    . Right rotator cuff surgery     . Right ankle surgery       x 2- no retained hardware  . Total knee revision Right 12/16/2013    Procedure: RIGHT TOTAL KNEE REVISION POLY EXCHANGE;  Surgeon: Mauri Pole, MD;  Location: [facility];  Service: Orthopedics;   Laterality: Right;  . Mass excision Left 12/16/2013    Procedure: EXCISION LEFT  DISTAL THIGH MASS;  Surgeon: Mauri Pole, MD;  Location: [facility];  Service: Orthopedics;  Laterality: Left;  . Breast surgery Right     lumpectomy-benign  . Total knee arthroplasty Left 05/05/2015    Procedure: LEFT TOTAL KNEE ARTHROPLASTY;  Surgeon: Paralee Cancel, MD;  Location: [facility];  Service: Orthopedics;  Laterality: Left;    There were no vitals filed for this visit.      Subjective Assessment - 06/02/15 0933    Subjective I took a pain pill this morning.  Distal thigh pain at times.     Currently in Pain? No/denies   Pain Score 0-No pain   Pain Location Knee   Pain Orientation Left   Pain Type Surgical pain   Pain Frequency Intermittent            [facility] PT Assessment - 06/02/15 0001    AROM   Left Knee Extension 1   Left Knee Flexion 105  [place] Adult PT Treatment/Exercise - 06/02/15 0001    Knee/Hip Exercises: Aerobic   Stationary Bike Level 1, full revolutions x 10 minutes  initially   Knee/Hip Exercises: Machines for Strengthening   Total Gym Leg Press St #9, 60# B LE, 35# Lt LE 3 x 10  increase weight next visit   Knee/Hip Exercises: Standing   Forward Step Up Left;2 sets;10 reps;Hand Hold: 2;Step Height: 6"   Step Down Left;1 set;15 reps;Hand Hold: 2;Step Height: 2"   Knee/Hip Exercises: Seated   Long Arc Quad Strengthening;Left;1 set;15 reps;Weights   Long Arc Quad Weight 3 lbs.   Hamstring Curl Strengthening;Left;1 set;15 reps  red band   Sit to Sand 10 reps;without UE support  high table   Vasopneumatic   Number Minutes Vasopneumatic  15 minutes   Vasopnuematic Location  Knee   Vasopneumatic Pressure Medium   Vasopneumatic Temperature  snowflakes   Manual Therapy   Manual Therapy Joint mobilization;Soft tissue mobilization;Muscle Energy Technique   Joint Mobilization grade 2/3 flexion and extension mobs 3x 20 sec   Soft tissue  mobilization quads and HS  [place] instrument assisted quads   Muscle Energy Technique HS contract/relax 3x 5 sec                  PT Short Term Goals - 06/02/15 1024    PT SHORT TERM GOAL #1   Title be independent in initial HEP   Time 8   Period Weeks   Status On-going   PT SHORT TERM GOAL #2   Title demonstrate Lt knee AROM extension to lacking < or = to 5 degrees to normalize gait pattern   Time 8   Period Weeks   Status On-going   PT SHORT TERM GOAL #3   Title demonstrate Lt knee AROM flexion to > or = to 105 to allow for comfort with sitting without substitution   Time 8   Period Weeks   Status On-going   PT SHORT TERM GOAL #4   Title demonstrate symmetry with ambulation on level surface   Time 8   Period Weeks   Status On-going           PT Long Term Goals - 06/02/15 1024    PT LONG TERM GOAL #1   Title be independent in advanced HEP   Time 8   Period Weeks   PT LONG TERM GOAL #2   Title reduce FOTO to < or = to 45% limitation   Time 8   Period Weeks   Status On-going   PT LONG TERM GOAL #3   Title improve Lt LE strength and endurance walk for 30-45 minutes in the community without need to rest   Time 8   Period Weeks   Status On-going   PT LONG TERM GOAL #4   Title demonstrate Lt knee AROM flexion to 110 degrees to improve ability to squat   Time 8   Period Weeks   Status On-going   PT LONG TERM GOAL #5   Title ascend and descend steps and curbs with steo-over-step gait   Time 8   Period Weeks   Status On-going   PT LONG TERM GOAL #6   Title report a 60% reduction in Lt knee pain with standing and walking   Time 8   Period Weeks   Status On-going               Plan - 06/02/15 1012    Clinical Impression  Statement The patient's knee flexion and extension ROM is improving as well as decreased localized knee edema.  Improved soft tissue length left quads following manual therapy.  Decreased quad motor control with descending  step.  Therapist closely monitoring response with all treatment interventions.     PT Next Visit Plan increase weight on leg press;  continue with progressive knee flex and ext ROM;  quad strengthening in weight bearing      Patient will benefit from skilled therapeutic intervention in order to improve the following deficits and impairments:     Visit Diagnosis: Pain in left knee  Difficulty in walking, not elsewhere classified  Localized edema  Stiffness of left knee, not elsewhere classified  Muscle weakness (generalized)     Problem List Patient Active Problem List   Diagnosis Date Noted  . Obese 05/07/2015  . S/P left TKA 05/05/2015  . S/P knee replacement 05/05/2015  . Former smoker 07/10/2014  . Exertional shortness of breath 06/07/2014  . Chest pain 06/07/2014  . Osteoarthritis 05/16/2014  . Alcoholic fatty liver [id number]  . Chronic post-traumatic stress disorder (PTSD) 05/02/2014  . Sleep disorder 05/02/2014  . Family history of alcoholism 05/02/2014  . Macrocytosis 04/19/2014  . Fatigue 08/15/2013  . Thyroid nodule 09/17/2012  . Hypothyroid 07/21/2010  . Type II diabetes mellitus, well controlled ([place]) 06/23/2010  . HTN (hypertension) 06/23/2010  . Alcohol use disorder, severe, dependence ([place]) 06/23/2010  . Hyperlipidemia 06/23/2010  . Obesity (BMI 30-39.9) 06/23/2010  . Chronic depression 06/23/2010  . History of colonic polyps 04/12/2010    Alvera Singh 06/02/2015, 10:26 AM  [facility] [address] [place], [place], [address] Phone: [phone]   Fax:  [phone]  Name: [name] [name] MRN: [id number] Date of Birth: August 20, 1955    Ruben Im, PT 06/02/2015 10:26 AM Phone: [phone] Fax: [phone]

## 2015-06-03 ENCOUNTER — Other Ambulatory Visit (HOSPITAL_COMMUNITY): Payer: No Typology Code available for payment source

## 2015-06-04 ENCOUNTER — Ambulatory Visit: Payer: No Typology Code available for payment source

## 2015-06-04 DIAGNOSIS — M25562 Pain in left knee: Secondary | ICD-10-CM | POA: Diagnosis not present

## 2015-06-04 DIAGNOSIS — R262 Difficulty in walking, not elsewhere classified: Secondary | ICD-10-CM

## 2015-06-04 DIAGNOSIS — R600 Localized edema: Secondary | ICD-10-CM

## 2015-06-04 DIAGNOSIS — M6281 Muscle weakness (generalized): Secondary | ICD-10-CM

## 2015-06-04 DIAGNOSIS — M25662 Stiffness of left knee, not elsewhere classified: Secondary | ICD-10-CM

## 2015-06-04 NOTE — Therapy (Signed)
[facility] [address] [place], [place], [address] Phone: [phone]   Fax:  [phone]  Physical Therapy Treatment  Patient Details  Name: [name] [name] MRN: [id number] Date of Birth: 1955-03-02 Referring Provider: Gerrit Halls PAC/ Paralee Cancel, MD  Encounter Date: 06/04/2015      PT End of Session - 06/04/15 1512    Visit Number 4   Date for PT Re-Evaluation 07/22/15   PT Start Time 1440   PT Stop Time 1540   PT Time Calculation (min) 60 min   Activity Tolerance Patient tolerated treatment well   Behavior During Therapy [facility] for tasks assessed/performed      Past Medical History  Diagnosis Date  . Depression   . Hypertension   . Hyperlipidemia   . Colon polyps   . Thyroid disease   . History of recurrent UTIs   . Anemia     menstrual related  . Diabetes mellitus 12/2009    type 2  . Alcohol problem drinking     rehab  . Acute meniscal tear of knee     Left knee  . Heart murmur   . Shortness of breath     with exertion   . Hypothyroidism   . Anxiety   . Diabetes mellitus, type II ([place])   . Liver disease   . 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 Evalluate for OSA (obstructive sleep apnea) 08/15/2013    No OSA on sleep study but may be underestimated.-never used cpap    . GERD (gastroesophageal reflux disease)     not currently taking.  . Arthritis     right ankle-neuropathy  . Neuromuscular disorder ([place])     neropathy bilateral feet.    Past Surgical History  Procedure Laterality Date  . Total knee arthroplasty  2009    right  . Cesarean section  1987  . Cholecystectomy    . Tubal ligation  1987  . Tonsillectomy    . Adenoidectomy    . Right rotator cuff surgery     . Right ankle surgery       x 2- no retained hardware  . Total knee revision Right 12/16/2013    Procedure: RIGHT TOTAL KNEE REVISION POLY EXCHANGE;  Surgeon: Mauri Pole, MD;  Location: [facility];  Service: Orthopedics;  Laterality: Right;  . Mass excision Left 12/16/2013    Procedure: EXCISION LEFT  DISTAL THIGH MASS;  Surgeon: Mauri Pole, MD;  Location: [facility];  Service: Orthopedics;  Laterality: Left;  . Breast surgery Right     lumpectomy-benign  . Total knee arthroplasty Left 05/05/2015    Procedure: LEFT TOTAL KNEE ARTHROPLASTY;  Surgeon: Paralee Cancel, MD;  Location: [facility];  Service: Orthopedics;  Laterality: Left;    There were no vitals filed for this visit.      Subjective Assessment - 06/04/15 1445    Subjective Pt reports that she has had a bad couple of days.  Couldn't sleep last night.     Pertinent History Rt TKA- 2009, Lt TKA- 05/05/15   Patient Stated Goals improve gait, reduce Lt knee pain, improve AROM and strength   Currently in Pain? Yes   Pain Score 3    Pain Location Knee   Pain Orientation Left   Pain Descriptors / Indicators Shooting;Tightness;Aching;Cramping   Pain Type Surgical pain   Pain Onset 1 to 4 weeks ago  Pain Frequency Intermittent   Aggravating Factors  sleep at night, sitting without leg elevated, standing/walking   Pain Relieving Factors ice, elevation, rest                         [facility] Adult PT Treatment/Exercise - 06/04/15 0001    Knee/Hip Exercises: Stretches   Active Hamstring Stretch Left;3 reps;20 seconds  in sitting   Knee/Hip Exercises: Aerobic   Stationary Bike Level 1, full revolutions x 10 minutes  initially   Knee/Hip Exercises: Machines for Strengthening   Total Gym Leg Press St #9, 65# B LE 2x10, then 70# 1x10, 40# Lt LE 3 x 10   Knee/Hip Exercises: Standing   Lateral Step Up Left;2 sets;10 reps;Hand Hold: 2;Step Height: 6"   Forward Step Up Left;2 sets;10 reps;Hand Hold: 2;Step Height: 6"   Step Down --   Rebounder 3 directions x 1 min each with B UE support   Knee/Hip Exercises: Seated   Long Arc Quad Strengthening;Left;1 set;15 reps;Weights   Long Arc Quad  Weight 3 lbs.   Modalities   Modalities Vasopneumatic   Vasopneumatic   Number Minutes Vasopneumatic  15 minutes   Vasopnuematic Location  Knee   Vasopneumatic Pressure Medium   Vasopneumatic Temperature  snowflakes   Manual Therapy   Manual Therapy Joint mobilization;Soft tissue mobilization   Soft tissue mobilization distal quads and hamstrings                  PT Short Term Goals - 06/02/15 1024    PT SHORT TERM GOAL #1   Title be independent in initial HEP   Time 8   Period Weeks   Status On-going   PT SHORT TERM GOAL #2   Title demonstrate Lt knee AROM extension to lacking < or = to 5 degrees to normalize gait pattern   Time 8   Period Weeks   Status On-going   PT SHORT TERM GOAL #3   Title demonstrate Lt knee AROM flexion to > or = to 105 to allow for comfort with sitting without substitution   Time 8   Period Weeks   Status On-going   PT SHORT TERM GOAL #4   Title demonstrate symmetry with ambulation on level surface   Time 8   Period Weeks   Status On-going           PT Long Term Goals - 06/02/15 1024    PT LONG TERM GOAL #1   Title be independent in advanced HEP   Time 8   Period Weeks   PT LONG TERM GOAL #2   Title reduce FOTO to < or = to 45% limitation   Time 8   Period Weeks   Status On-going   PT LONG TERM GOAL #3   Title improve Lt LE strength and endurance walk for 30-45 minutes in the community without need to rest   Time 8   Period Weeks   Status On-going   PT LONG TERM GOAL #4   Title demonstrate Lt knee AROM flexion to 110 degrees to improve ability to squat   Time 8   Period Weeks   Status On-going   PT LONG TERM GOAL #5   Title ascend and descend steps and curbs with steo-over-step gait   Time 8   Period Weeks   Status On-going   PT LONG TERM GOAL #6   Title report a 60% reduction in Lt knee pain with  standing and walking   Time 8   Period Weeks   Status On-going               Plan - 06/04/15 1453     Clinical Impression Statement Lt knee AROM is improved to 105 degrees this week.  Pt with flare-up of pain and edema this week.  Pt is able to perform full revolutions on the bicycle after 4 minutes of warm-up. Pt able to tolerate increased weight on the leg press today.  Pt with decreased quad motor control with steps.  Pt will continue to benefit from skilled PT for edema management, ROM, strength and proprioception advancement.     Rehab Potential Good   PT Frequency 3x / week   PT Duration 8 weeks   PT Treatment/Interventions ADLs/Self Care Home Management;Cryotherapy;Electrical Stimulation;Moist Heat;Therapeutic exercise;Therapeutic activities;Functional mobility training;Stair training;Gait training;Ultrasound;Neuromuscular re-education;Patient/family education;Manual techniques;Vasopneumatic Device;Taping;Passive range of motion;Scar mobilization   PT Next Visit Plan continue with progressive knee flex and ext ROM;  quad strengthening in weight bearing   Consulted and Agree with Plan of Care Patient      Patient will benefit from skilled therapeutic intervention in order to improve the following deficits and impairments:  Abnormal gait, Decreased range of motion, Difficulty walking, Decreased activity tolerance, Pain, Increased edema, Decreased strength, Impaired flexibility, Decreased scar mobility, Decreased mobility  Visit Diagnosis: Pain in left knee  Difficulty in walking, not elsewhere classified  Localized edema  Stiffness of left knee, not elsewhere classified  Muscle weakness (generalized)     Problem List Patient Active Problem List   Diagnosis Date Noted  . Obese 05/07/2015  . S/P left TKA 05/05/2015  . S/P knee replacement 05/05/2015  . Former smoker 07/10/2014  . Exertional shortness of breath 06/07/2014  . Chest pain 06/07/2014  . Osteoarthritis 05/16/2014  . Alcoholic fatty liver Q000111Q  . Chronic post-traumatic stress disorder (PTSD) 05/02/2014  .  Sleep disorder 05/02/2014  . Family history of alcoholism 05/02/2014  . Macrocytosis 04/19/2014  . Fatigue 08/15/2013  . Thyroid nodule 09/17/2012  . Hypothyroid 07/21/2010  . Type II diabetes mellitus, well controlled ([place]) 06/23/2010  . HTN (hypertension) 06/23/2010  . Alcohol use disorder, severe, dependence ([place]) 06/23/2010  . Hyperlipidemia 06/23/2010  . Obesity (BMI 30-39.9) 06/23/2010  . Chronic depression 06/23/2010  . History of colonic polyps 04/12/2010    Sigurd Sos, PT 06/04/2015 3:26 PM  [facility] [address], [place], [place], [address] Phone: [phone]   Fax:  [phone]  Name: [name] [name] MRN: [id number] Date of Birth: January 22, 1956

## 2015-06-08 ENCOUNTER — Encounter: Payer: Self-pay | Admitting: Physical Therapy

## 2015-06-08 ENCOUNTER — Ambulatory Visit: Payer: No Typology Code available for payment source | Admitting: Physical Therapy

## 2015-06-08 DIAGNOSIS — M25562 Pain in left knee: Secondary | ICD-10-CM

## 2015-06-08 DIAGNOSIS — M6281 Muscle weakness (generalized): Secondary | ICD-10-CM

## 2015-06-08 DIAGNOSIS — R600 Localized edema: Secondary | ICD-10-CM

## 2015-06-08 DIAGNOSIS — R262 Difficulty in walking, not elsewhere classified: Secondary | ICD-10-CM

## 2015-06-08 DIAGNOSIS — M25662 Stiffness of left knee, not elsewhere classified: Secondary | ICD-10-CM

## 2015-06-08 NOTE — Therapy (Signed)
[facility] [address] [place], [place], [address] Phone: [phone]   Fax:  [phone]  Physical Therapy Treatment  Patient Details  Name: [name] [name] MRN: [id number] Date of Birth: [birth year] Referring Provider: Gerrit Halls PAC/ Paralee Cancel, MD  Encounter Date: 06/08/2015      PT End of Session - 06/08/15 1431    Visit Number 5   Date for PT Re-Evaluation 07/22/15   PT Start Time 1400   PT Stop Time 1502   PT Time Calculation (min) 62 min   Activity Tolerance Patient tolerated treatment well   Behavior During Therapy [facility] for tasks assessed/performed      Past Medical History  Diagnosis Date  . Depression   . Hypertension   . Hyperlipidemia   . Colon polyps   . Thyroid disease   . History of recurrent UTIs   . Anemia     menstrual related  . Diabetes mellitus 12/2009    type 2  . Alcohol problem drinking     rehab  . Acute meniscal tear of knee     Left knee  . Heart murmur   . Shortness of breath     with exertion   . Hypothyroidism   . Anxiety   . Diabetes mellitus, type II ([place])   . Liver disease   . 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 Evalluate for OSA (obstructive sleep apnea) 08/15/2013    No OSA on sleep study but may be underestimated.-never used cpap    . GERD (gastroesophageal reflux disease)     not currently taking.  . Arthritis     right ankle-neuropathy  . Neuromuscular disorder ([place])     neropathy bilateral feet.    Past Surgical History  Procedure Laterality Date  . Total knee arthroplasty  2009    right  . Cesarean section  1987  . Cholecystectomy    . Tubal ligation  1987  . Tonsillectomy    . Adenoidectomy    . Right rotator cuff surgery     . Right ankle surgery       x 2- no retained hardware  . Total knee revision Right 12/16/2013    Procedure: RIGHT TOTAL KNEE REVISION POLY EXCHANGE;  Surgeon: Mauri Pole, MD;  Location: [facility];  Service: Orthopedics;  Laterality: Right;  . Mass excision Left 12/16/2013    Procedure: EXCISION LEFT  DISTAL THIGH MASS;  Surgeon: Mauri Pole, MD;  Location: [facility];  Service: Orthopedics;  Laterality: Left;  . Breast surgery Right     lumpectomy-benign  . Total knee arthroplasty Left 05/05/2015    Procedure: LEFT TOTAL KNEE ARTHROPLASTY;  Surgeon: Paralee Cancel, MD;  [facility];  Service: Orthopedics;  Laterality: Left;    There were no vitals filed for this visit.      Subjective Assessment - 06/08/15 1412    Subjective Pt rates her pain as 2/10. Left knee is very stiff in the morning or after prolonged sitting   Pertinent History Rt TKA- 2009, Lt TKA- 05/05/15   Limitations Sitting;Standing;Walking   How long can you sit comfortably? less than 1 hour without leg elevated   How long can you stand comfortably? 20 minutes    How long can you walk comfortably? 15 minutes   Patient Stated Goals improve gait, reduce Lt knee pain, improve AROM and strength   Currently in Pain? Yes  Pain Score 2    Pain Location Knee   Pain Orientation Left   Pain Descriptors / Indicators Shooting;Tightness;Aching;Cramping   Pain Type Surgical pain   Pain Onset 1 to 4 weeks ago   Pain Frequency Intermittent   Aggravating Factors  prolonged sitting, standing/walking, sleep at night   Pain Relieving Factors ice, elevation, rest   Multiple Pain Sites No                         [facility] Adult PT Treatment/Exercise - 06/08/15 0001    Exercises   Exercises Knee/Hip   Knee/Hip Exercises: Stretches   Active Hamstring Stretch Left;3 reps;20 seconds  at stairs   Knee/Hip Exercises: Aerobic   Stationary Bike Level 1, full revolutions x 10 minutes   Knee/Hip Exercises: Machines for Strengthening   Total Gym Leg Press St #9, 65# B LE 2x10, then 70# 1x10, 40# Lt LE 3 x 10   Knee/Hip Exercises: Standing   Lateral Step Up Left;2 sets;10 reps;Hand Hold:  2;Step Height: 6"   Forward Step Up Left;2 sets;10 reps;Hand Hold: 2;Step Height: 6"   Rebounder 3 directions x 1 min each with B UE support   Walking with Sports Cord 25# forward/reverse with A initially   Modalities   Modalities Vasopneumatic   Vasopneumatic   Number Minutes Vasopneumatic  15 minutes   Vasopnuematic Location  Knee   Vasopneumatic Pressure Medium   Vasopneumatic Temperature  snowflakes   Manual Therapy   Manual Therapy Joint mobilization;Soft tissue mobilization   Soft tissue mobilization distal quads and hamstrings                  PT Short Term Goals - 06/02/15 1024    PT SHORT TERM GOAL #1   Title be independent in initial HEP   Time 8   Period Weeks   Status On-going   PT SHORT TERM GOAL #2   Title demonstrate Lt knee AROM extension to lacking < or = to 5 degrees to normalize gait pattern   Time 8   Period Weeks   Status On-going   PT SHORT TERM GOAL #3   Title demonstrate Lt knee AROM flexion to > or = to 105 to allow for comfort with sitting without substitution   Time 8   Period Weeks   Status On-going   PT SHORT TERM GOAL #4   Title demonstrate symmetry with ambulation on level surface   Time 8   Period Weeks   Status On-going           PT Long Term Goals - 06/08/15 1436    PT LONG TERM GOAL #1   Title be independent in advanced HEP   Time 8   Period Weeks   Status On-going   PT LONG TERM GOAL #2   Title reduce FOTO to < or = to 45% limitation   Time 8   Period Weeks   Status On-going   PT LONG TERM GOAL #3   Title improve Lt LE strength and endurance walk for 30-45 minutes in the community without need to rest   Time 8   Period Weeks   Status On-going   PT LONG TERM GOAL #4   Title demonstrate Lt knee AROM flexion to 110 degrees to improve ability to squat   Time 8   Period Weeks   Status On-going   PT LONG TERM GOAL #5   Title ascend and descend steps  and curbs with steo-over-step gait   Time 8   Period Weeks    Status On-going   PT LONG TERM GOAL #6   Title report a 60% reduction in Lt knee pain with standing and walking   Time 8   Period Weeks   Status On-going               Plan - 06/08/15 1431    Clinical Impression Statement Pt was able to increase the weight on the leg press, and able for full revolution from the beginning. Pt will continue to benefit from skilled PT for edema management, ROM, strength and proprioception advancement.    Rehab Potential Good   PT Frequency 3x / week   PT Duration 8 weeks   PT Treatment/Interventions ADLs/Self Care Home Management;Cryotherapy;Electrical Stimulation;Moist Heat;Therapeutic exercise;Therapeutic activities;Functional mobility training;Stair training;Gait training;Ultrasound;Neuromuscular re-education;Patient/family education;Manual techniques;Vasopneumatic Device;Taping;Passive range of motion;Scar mobilization   PT Next Visit Plan continue with progressive knee flex and ext ROM;  quad strengthening in weight bearing   Consulted and Agree with Plan of Care Patient      Patient will benefit from skilled therapeutic intervention in order to improve the following deficits and impairments:  Abnormal gait, Decreased range of motion, Difficulty walking, Decreased activity tolerance, Pain, Increased edema, Decreased strength, Impaired flexibility, Decreased scar mobility, Decreased mobility  Visit Diagnosis: Pain in left knee  Difficulty in walking, not elsewhere classified  Localized edema  Stiffness of left knee, not elsewhere classified  Muscle weakness (generalized)     Problem List Patient Active Problem List   Diagnosis Date Noted  . Obese 05/07/2015  . S/P left TKA 05/05/2015  . S/P knee replacement 05/05/2015  . Former smoker 07/10/2014  . Exertional shortness of breath 06/07/2014  . Chest pain 06/07/2014  . Osteoarthritis 05/16/2014  . Alcoholic fatty liver Q000111Q  . Chronic post-traumatic stress disorder (PTSD)  05/02/2014  . Sleep disorder 05/02/2014  . Family history of alcoholism 05/02/2014  . Macrocytosis 04/19/2014  . Fatigue 08/15/2013  . Thyroid nodule 09/17/2012  . Hypothyroid 07/21/2010  . Type II diabetes mellitus, well controlled ([place]) 06/23/2010  . HTN (hypertension) 06/23/2010  . Alcohol use disorder, severe, dependence ([place]) 06/23/2010  . Hyperlipidemia 06/23/2010  . Obesity (BMI 30-39.9) 06/23/2010  . Chronic depression 06/23/2010  . History of colonic polyps 04/12/2010    NAUMANN-HOUEGNIFIO,Chelsa Stout PTA 06/08/2015, 2:53 PM  [facility] [address], [place], [place], [address] Phone: [phone]   Fax:  [phone]  Name: Natonya Kosmalski MRN: [id number] Date of Birth: November 03, 1955

## 2015-06-09 ENCOUNTER — Ambulatory Visit (HOSPITAL_COMMUNITY): Payer: Self-pay | Admitting: Licensed Clinical Social Worker

## 2015-06-10 ENCOUNTER — Ambulatory Visit: Payer: No Typology Code available for payment source

## 2015-06-10 DIAGNOSIS — M25662 Stiffness of left knee, not elsewhere classified: Secondary | ICD-10-CM

## 2015-06-10 DIAGNOSIS — R262 Difficulty in walking, not elsewhere classified: Secondary | ICD-10-CM

## 2015-06-10 DIAGNOSIS — M25562 Pain in left knee: Secondary | ICD-10-CM

## 2015-06-10 DIAGNOSIS — R600 Localized edema: Secondary | ICD-10-CM

## 2015-06-10 DIAGNOSIS — M6281 Muscle weakness (generalized): Secondary | ICD-10-CM

## 2015-06-10 NOTE — Therapy (Signed)
[facility] [address] [place], [place], [address] Phone: [phone]   Fax:  [phone]  Physical Therapy Treatment  Patient Details  Name: [name] [name] MRN: [id number] Date of Birth: 1955/05/10 Referring Provider: Gerrit Halls PAC/ Paralee Cancel, MD  Encounter Date: 06/10/2015      PT End of Session - 06/10/15 1140    Visit Number 6   Date for PT Re-Evaluation 07/22/15   PT Start Time 1103   PT Stop Time 1200   PT Time Calculation (min) 57 min   Activity Tolerance Patient tolerated treatment well   Behavior During Therapy [facility] for tasks assessed/performed      Past Medical History  Diagnosis Date  . Depression   . Hypertension   . Hyperlipidemia   . Colon polyps   . Thyroid disease   . History of recurrent UTIs   . Anemia     menstrual related  . Diabetes mellitus 12/2009    type 2  . Alcohol problem drinking     rehab  . Acute meniscal tear of knee     Left knee  . Heart murmur   . Shortness of breath     with exertion   . Hypothyroidism   . Anxiety   . Diabetes mellitus, type II ([place])   . Liver disease   . 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 Evalluate for OSA (obstructive sleep apnea) 08/15/2013    No OSA on sleep study but may be underestimated.-never used cpap    . GERD (gastroesophageal reflux disease)     not currently taking.  . Arthritis     right ankle-neuropathy  . Neuromuscular disorder ([place])     neropathy bilateral feet.    Past Surgical History  Procedure Laterality Date  . Total knee arthroplasty  2009    right  . Cesarean section  1987  . Cholecystectomy    . Tubal ligation  1987  . Tonsillectomy    . Adenoidectomy    . Right rotator cuff surgery     . Right ankle surgery       x 2- no retained hardware  . Total knee revision Right 12/16/2013    Procedure: RIGHT TOTAL KNEE REVISION POLY EXCHANGE;  Surgeon: Mauri Pole, MD;  [facility];  Service: Orthopedics;  Laterality: Right;  . Mass excision Left 12/16/2013    Procedure: EXCISION LEFT  DISTAL THIGH MASS;  Surgeon: Mauri Pole, MD;  [facility];  Service: Orthopedics;  Laterality: Left;  . Breast surgery Right     lumpectomy-benign  . Total knee arthroplasty Left 05/05/2015    Procedure: LEFT TOTAL KNEE ARTHROPLASTY;  Surgeon: Paralee Cancel, MD;  [facility];  Service: Orthopedics;  Laterality: Left;    There were no vitals filed for this visit.      Subjective Assessment - 06/10/15 1106    Subjective Lt knee feels OK.     Pertinent History Rt TKA- 2009, Lt TKA- 05/05/15   Currently in Pain? Yes   Pain Score 2    Pain Location Knee   Pain Orientation Left   Pain Descriptors / Indicators Shooting;Tightness;Aching;Cramping   Pain Type Surgical pain   Pain Onset 1 to 4 weeks ago   Pain Frequency Intermittent   Aggravating Factors  standing/walking, sleep at night   Pain Relieving Factors ice, elevation, rest  [facility] PT Assessment - 06/10/15 0001    AROM   Left Knee Extension 4   Left Knee Flexion 108                     [facility] Adult PT Treatment/Exercise - 06/10/15 0001    Knee/Hip Exercises: Stretches   Active Hamstring Stretch Left;3 reps;20 seconds  at stairs   Knee/Hip Exercises: Aerobic   Stationary Bike Level 1, full revolutions x 10 minutes   Knee/Hip Exercises: Machines for Strengthening   Total Gym Leg Press St #8, 80# 3x10 bil. 40# Lt LE 2x10, 50# Lt only 3x10   Knee/Hip Exercises: Standing   Lateral Step Up Left;2 sets;10 reps;Hand Hold: 1;Step Height: 8"   Forward Step Up Left;2 sets;10 reps;Hand Hold: 1;Step Height: 8"   Walking with Sports Cord 25# forward/reverse  x 10 each   Knee/Hip Exercises: Supine   Heel Slides PROM;1 set;10 reps;Left   Modalities   Modalities Vasopneumatic   Vasopneumatic   Number Minutes Vasopneumatic  15 minutes   Vasopnuematic Location  Knee    Vasopneumatic Pressure Medium   Vasopneumatic Temperature  3 snowflakes                  PT Short Term Goals - 06/10/15 1111    PT SHORT TERM GOAL #1   Title be independent in initial HEP   Time 4   Status Achieved   PT SHORT TERM GOAL #2   Title demonstrate Lt knee AROM extension to lacking < or = to 5 degrees to normalize gait pattern   Time 4   Period Weeks   Status Achieved   PT SHORT TERM GOAL #3   Title demonstrate Lt knee AROM flexion to > or = to 105 to allow for comfort with sitting without substitution   Time 4   Period Weeks   Status Achieved   PT SHORT TERM GOAL #4   Title demonstrate symmetry with ambulation on level surface   Time 4   Period Weeks   Status On-going  mild antalgia           PT Long Term Goals - 06/10/15 1112    PT LONG TERM GOAL #5   Title ascend and descend steps and curbs with steo-over-step gait   Time 8   Period Weeks   Status On-going   PT LONG TERM GOAL #6   Title report a 60% reduction in Lt knee pain with standing and walking   Time 8   Period Weeks   Status On-going               Plan - 06/10/15 1116    Clinical Impression Statement Pt is able to walk for 45 minutes in the store with use of a cart. Pt ascends steps with leading with Lt and with Rt LE equally.  Pt has not encountered steps in the community to determine gait pattern with this activity.  Pt able to tolerate increased weight on the leg press and increased step height with clinic exercise this week.  Lt knee AROM is 4-108 degrees.  PT encouraged pt to stretch into flexion and extension at home. Pt will continue to benfit from skilled PT for ROM, strength/endurance progression and edema management s/p Lt TKA   Rehab Potential Good   PT Frequency 3x / week   PT Duration 8 weeks   PT Treatment/Interventions ADLs/Self Care Home Management;Cryotherapy;Electrical Stimulation;Moist Heat;Therapeutic exercise;Therapeutic activities;Functional mobility  training;Stair training;Gait  training;Ultrasound;Neuromuscular re-education;Patient/family education;Manual techniques;Vasopneumatic Device;Taping;Passive range of motion;Scar mobilization   PT Next Visit Plan continue with progressive knee flex and ext ROM;  quad strengthening in weight bearing   Consulted and Agree with Plan of Care Patient      Patient will benefit from skilled therapeutic intervention in order to improve the following deficits and impairments:  Abnormal gait, Decreased range of motion, Difficulty walking, Decreased activity tolerance, Pain, Increased edema, Decreased strength, Impaired flexibility, Decreased scar mobility, Decreased mobility  Visit Diagnosis: Pain in left knee  Difficulty in walking, not elsewhere classified  Localized edema  Stiffness of left knee, not elsewhere classified  Muscle weakness (generalized)     Problem List Patient Active Problem List   Diagnosis Date Noted  . Obese 05/07/2015  . S/P left TKA 05/05/2015  . S/P knee replacement 05/05/2015  . Former smoker 07/10/2014  . Exertional shortness of breath 06/07/2014  . Chest pain 06/07/2014  . Osteoarthritis 05/16/2014  . Alcoholic fatty liver Q000111Q  . Chronic post-traumatic stress disorder (PTSD) 05/02/2014  . Sleep disorder 05/02/2014  . Family history of alcoholism 05/02/2014  . Macrocytosis 04/19/2014  . Fatigue 08/15/2013  . Thyroid nodule 09/17/2012  . Hypothyroid 07/21/2010  . Type II diabetes mellitus, well controlled ([place]) 06/23/2010  . HTN (hypertension) 06/23/2010  . Alcohol use disorder, severe, dependence ([place]) 06/23/2010  . Hyperlipidemia 06/23/2010  . Obesity (BMI 30-39.9) 06/23/2010  . Chronic depression 06/23/2010  . History of colonic polyps 04/12/2010    Sigurd Sos, PT 06/10/2015 11:44 AM  [facility] [address], [place], [place], [address] Phone: [phone]   Fax:   [phone]  Name: Astyn Britten MRN: [id number] Date of Birth: 08-19-1955

## 2015-06-12 ENCOUNTER — Encounter: Payer: Self-pay | Admitting: Physical Therapy

## 2015-06-12 ENCOUNTER — Ambulatory Visit: Payer: No Typology Code available for payment source | Admitting: Physical Therapy

## 2015-06-12 DIAGNOSIS — M25562 Pain in left knee: Secondary | ICD-10-CM | POA: Diagnosis not present

## 2015-06-12 NOTE — Therapy (Signed)
[facility] [address] [place], [place], [address] Phone: [phone]   Fax:  [phone]  Physical Therapy Treatment  Patient Details  Name: [name] [name] MRN: [id number] Date of Birth: 1955/02/26 Referring Provider: Gerrit Halls PAC/ Paralee Cancel, MD  Encounter Date: 06/12/2015      PT End of Session - 06/12/15 1036    Visit Number 7   Date for PT Re-Evaluation 07/22/15   PT Start Time 1020   PT Stop Time 1119   PT Time Calculation (min) 59 min   Activity Tolerance Patient tolerated treatment well   Behavior During Therapy [facility] for tasks assessed/performed      Past Medical History  Diagnosis Date  . Depression   . Hypertension   . Hyperlipidemia   . Colon polyps   . Thyroid disease   . History of recurrent UTIs   . Anemia     menstrual related  . Diabetes mellitus 12/2009    type 2  . Alcohol problem drinking     rehab  . Acute meniscal tear of knee     Left knee  . Heart murmur   . Shortness of breath     with exertion   . Hypothyroidism   . Anxiety   . Diabetes mellitus, type II ([place])   . Liver disease   . 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 Evalluate for OSA (obstructive sleep apnea) 08/15/2013    No OSA on sleep study but may be underestimated.-never used cpap    . GERD (gastroesophageal reflux disease)     not currently taking.  . Arthritis     right ankle-neuropathy  . Neuromuscular disorder ([place])     neropathy bilateral feet.    Past Surgical History  Procedure Laterality Date  . Total knee arthroplasty  2009    right  . Cesarean section  1987  . Cholecystectomy    . Tubal ligation  1987  . Tonsillectomy    . Adenoidectomy    . Right rotator cuff surgery     . Right ankle surgery       x 2- no retained hardware  . Total knee revision Right 12/16/2013    Procedure: RIGHT TOTAL KNEE REVISION POLY EXCHANGE;  Surgeon: Mauri Pole, MD;  [facility];  Service: Orthopedics;  Laterality: Right;  . Mass excision Left 12/16/2013    Procedure: EXCISION LEFT  DISTAL THIGH MASS;  Surgeon: Mauri Pole, MD;  [facility];  Service: Orthopedics;  Laterality: Left;  . Breast surgery Right     lumpectomy-benign  . Total knee arthroplasty Left 05/05/2015    Procedure: LEFT TOTAL KNEE ARTHROPLASTY;  Surgeon: Paralee Cancel, MD;  [facility];  Service: Orthopedics;  Laterality: Left;    There were no vitals filed for this visit.      Subjective Assessment - 06/12/15 1031    Subjective Left knee with stiffness swelling and increased temperature and redness along scar   Pertinent History Rt TKA- 2009, Lt TKA- 05/05/15   Limitations Sitting;Standing;Walking   How long can you sit comfortably? less than 1 hour without leg elevated   How long can you stand comfortably? 20 minutes    How long can you walk comfortably? 15 minutes   Patient Stated Goals improve gait, reduce Lt knee pain, improve AROM and strength   Currently in Pain? Yes   Pain Score 2  Pain Location Knee   Pain Orientation Left   Pain Descriptors / Indicators Aching;Cramping;Shooting;Tightness   Pain Type Surgical pain   Pain Onset More than a month ago   Pain Frequency Intermittent   Aggravating Factors  standing/walking, sleep at night   Pain Relieving Factors ice, elevation, rest   Multiple Pain Sites No                         [facility] Adult PT Treatment/Exercise - 06/12/15 0001    Exercises   Exercises Knee/Hip   Knee/Hip Exercises: Stretches   Active Hamstring Stretch Left;3 reps;20 seconds  at stairs   Knee/Hip Exercises: Aerobic   Stationary Bike Level 1, full revolutions x 10 minutes   Knee/Hip Exercises: Machines for Strengthening   Total Gym Leg Press St #8, 80# 3x10 bil. 40# Lt LE 3x10,   increase weight to 90# B LE   Knee/Hip Exercises: Standing   Lateral Step Up Left;2 sets;10 reps;Hand Hold: 1;Step  Height: 8"   Forward Step Up Left;2 sets;10 reps;Hand Hold: 1;Step Height: 8"   Walking with Sports Cord 25# forward/reverse & Backward/reverse x 10 each   Modalities   Modalities Vasopneumatic   Vasopneumatic   Number Minutes Vasopneumatic  15 minutes   Vasopnuematic Location  Knee   Vasopneumatic Pressure Medium   Vasopneumatic Temperature  3 snowflakes   Manual Therapy   Soft tissue mobilization distal quads and hamstrings                  PT Short Term Goals - 06/10/15 1111    PT SHORT TERM GOAL #1   Title be independent in initial HEP   Time 4   Status Achieved   PT SHORT TERM GOAL #2   Title demonstrate Lt knee AROM extension to lacking < or = to 5 degrees to normalize gait pattern   Time 4   Period Weeks   Status Achieved   PT SHORT TERM GOAL #3   Title demonstrate Lt knee AROM flexion to > or = to 105 to allow for comfort with sitting without substitution   Time 4   Period Weeks   Status Achieved   PT SHORT TERM GOAL #4   Title demonstrate symmetry with ambulation on level surface   Time 4   Period Weeks   Status On-going  mild antalgia           PT Long Term Goals - 06/10/15 1112    PT LONG TERM GOAL #5   Title ascend and descend steps and curbs with steo-over-step gait   Time 8   Period Weeks   Status On-going   PT LONG TERM GOAL #6   Title report a 60% reduction in Lt knee pain with standing and walking   Time 8   Period Weeks   Status On-going               Plan - 06/12/15 1039    Clinical Impression Statement Pt with improved b confidence with resisted walking, Pt with edema and redness in left knee. Pt will continue to benefit from skilled PT for ROM, strength/endurance progression and edema managment s/p Lt TKA    Rehab Potential Good   PT Frequency 3x / week   PT Duration 8 weeks   PT Treatment/Interventions ADLs/Self Care Home Management;Cryotherapy;Electrical Stimulation;Moist Heat;Therapeutic exercise;Therapeutic  activities;Functional mobility training;Stair training;Gait training;Ultrasound;Neuromuscular re-education;Patient/family education;Manual techniques;Vasopneumatic Device;Taping;Passive range of motion;Scar mobilization   PT Next  Visit Plan continue with progressive knee flex and ext ROM;  quad strengthening in weight bearing   Consulted and Agree with Plan of Care Patient      Patient will benefit from skilled therapeutic intervention in order to improve the following deficits and impairments:  Abnormal gait, Decreased range of motion, Difficulty walking, Decreased activity tolerance, Pain, Increased edema, Decreased strength, Impaired flexibility, Decreased scar mobility, Decreased mobility  Visit Diagnosis: Pain in left knee     Problem List Patient Active Problem List   Diagnosis Date Noted  . Obese 05/07/2015  . S/P left TKA 05/05/2015  . S/P knee replacement 05/05/2015  . Former smoker 07/10/2014  . Exertional shortness of breath 06/07/2014  . Chest pain 06/07/2014  . Osteoarthritis 05/16/2014  . Alcoholic fatty liver Q000111Q  . Chronic post-traumatic stress disorder (PTSD) 05/02/2014  . Sleep disorder 05/02/2014  . Family history of alcoholism 05/02/2014  . Macrocytosis 04/19/2014  . Fatigue 08/15/2013  . Thyroid nodule 09/17/2012  . Hypothyroid 07/21/2010  . Type II diabetes mellitus, well controlled ([place]) 06/23/2010  . HTN (hypertension) 06/23/2010  . Alcohol use disorder, severe, dependence ([place]) 06/23/2010  . Hyperlipidemia 06/23/2010  . Obesity (BMI 30-39.9) 06/23/2010  . Chronic depression 06/23/2010  . History of colonic polyps 04/12/2010    Rivka Barbara, PTA 06/12/2015 11:59 AM  [facility] [address] [place], [place], [address] Phone: [phone]   Fax:  [phone]  Name: [name] [name] MRN: [id number] Date of Birth: 05/16/1955

## 2015-06-13 ENCOUNTER — Other Ambulatory Visit: Payer: Self-pay | Admitting: Family Medicine

## 2015-06-14 ENCOUNTER — Encounter: Payer: Self-pay | Admitting: Family Medicine

## 2015-06-15 ENCOUNTER — Ambulatory Visit: Payer: No Typology Code available for payment source | Admitting: Physical Therapy

## 2015-06-15 ENCOUNTER — Encounter: Payer: Self-pay | Admitting: Physical Therapy

## 2015-06-15 DIAGNOSIS — M25562 Pain in left knee: Secondary | ICD-10-CM

## 2015-06-15 NOTE — Therapy (Signed)
[facility] [address] [place], [place], [address] Phone: [phone]   Fax:  [phone]  Physical Therapy Treatment  Patient Details  Name: [name] [name] MRN: [id number] Date of Birth: [birth year] Referring Provider: Gerrit Halls PAC/ Paralee Cancel, MD  Encounter Date: 06/15/2015      PT End of Session - 06/15/15 1654    Visit Number 8   Date for PT Re-Evaluation 07/22/15   PT Start Time 1620  pt arrived late   PT Stop Time 1703   PT Time Calculation (min) 43 min   Activity Tolerance Patient tolerated treatment well   Behavior During Therapy [facility] for tasks assessed/performed      Past Medical History  Diagnosis Date  . Depression   . Hypertension   . Hyperlipidemia   . Colon polyps   . Thyroid disease   . History of recurrent UTIs   . Anemia     menstrual related  . Diabetes mellitus 12/2009    type 2  . Alcohol problem drinking     rehab  . Acute meniscal tear of knee     Left knee  . Heart murmur   . Shortness of breath     with exertion   . Hypothyroidism   . Anxiety   . Diabetes mellitus, type II ([name])   . Liver disease   . 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 Evalluate for OSA (obstructive sleep apnea) 08/15/2013    No OSA on sleep study but may be underestimated.-never used cpap    . GERD (gastroesophageal reflux disease)     not currently taking.  . Arthritis     right ankle-neuropathy  . Neuromuscular disorder ([place])     neropathy bilateral feet.    Past Surgical History  Procedure Laterality Date  . Total knee arthroplasty  2009    right  . Cesarean section  1987  . Cholecystectomy    . Tubal ligation  1987  . Tonsillectomy    . Adenoidectomy    . Right rotator cuff surgery     . Right ankle surgery       x 2- no retained hardware  . Total knee revision Right 12/16/2013    Procedure: RIGHT TOTAL KNEE REVISION POLY EXCHANGE;   Surgeon: Mauri Pole, MD;  Location: [facility];  Service: Orthopedics;  Laterality: Right;  . Mass excision Left 12/16/2013    Procedure: EXCISION LEFT  DISTAL THIGH MASS;  Surgeon: Mauri Pole, MD;  Location: [facility];  Service: Orthopedics;  Laterality: Left;  . Breast surgery Right     lumpectomy-benign  . Total knee arthroplasty Left 05/05/2015    Procedure: LEFT TOTAL KNEE ARTHROPLASTY;  Surgeon: Paralee Cancel, MD;  Location: [facility];  Service: Orthopedics;  Laterality: Left;    There were no vitals filed for this visit.      Subjective Assessment - 06/15/15 1630    Subjective Still difficult to sit down on low seat as toilett or chair and needs UE support for sit/stand transition. Left knee with stiffness, swelling, redness and increased temperature along scare but improving.    Pertinent History Rt TKA- 2009, Lt TKA- 05/05/15   Limitations Sitting;Standing;Walking   How long can you sit comfortably? less than 1 hour without leg elevated   How long can you stand comfortably? 20 minutes    How long can you walk comfortably? 15 minutes  Patient Stated Goals improve gait, reduce Lt knee pain, improve AROM and strength   Currently in Pain? Yes   Pain Score 1    Pain Location Knee   Pain Orientation Left   Pain Descriptors / Indicators Aching;Cramping;Shooting;Tightness   Pain Type Surgical pain   Pain Onset More than a month ago   Pain Frequency Intermittent   Multiple Pain Sites No            [facility] PT Assessment - 06/15/15 0001    Assessment   Medical Diagnosis s/p Lt TKA   Referring Provider Gerrit Halls PAC/ Paralee Cancel, MD   Onset Date/Surgical Date 05/05/15   Next MD Visit 06/17/2015   Prior Therapy home health PT   Precautions   Precautions None   Restrictions   Weight Bearing Restrictions No   Balance Screen   Has the patient fallen in the past 6 months No   Has the patient had a decrease in activity level because of a fear of falling?  No   Is the patient  reluctant to leave their home because of a fear of falling?  No   Home Environment   Living Environment Private residence   Living Arrangements Spouse/significant other   Type of [place] Access Level entry   [place] One level   Prior Function   Level of [place] Retired   Leisure none   Cognition   Overall Cognitive Status Within Functional Limits for tasks assessed   Observation/Other Assessments   Focus on Therapeutic Outcomes (FOTO)  49% limitations  as of 05/27/15   ROM / Strength   AROM / PROM / Strength AROM   AROM   Overall AROM  Deficits   AROM Assessment Site Knee   Right/Left Knee Right;Left   Right Knee Extension 3   Right Knee Flexion 110   Left Knee Extension 4   Left Knee Flexion 110   PROM   Overall PROM  Deficits   PROM Assessment Site Knee   Right/Left Knee Left   Left Knee Extension 3   Left Knee Flexion 112   Strength   Overall Strength Deficits   Overall Strength Comments Bil hip 4+/5, knee 4+/5 on Lt, 5/5 on Rt   Palpation   Patella mobility reduced patellar mobility in all directions on the Lt with pain.     Palpation comment Mid patellar edema: Lt 19 inches, Rt 18.5 inches.  Incision is 5.5 inches.  Reduced mobility of incision with scabbing present.  Tenderness to palpation over distal quad and hamstring insertions.   Ambulation/Gait   Ambulation/Gait Yes   Ambulation/Gait Assistance 6: Modified independent (Device/Increase time)   Ambulation Distance (Feet) 100 Feet   Gait Pattern Step-through pattern;Decreased stance time - left;Decreased step length - left                     [facility] Adult PT Treatment/Exercise - 06/15/15 0001    Exercises   Exercises Knee/Hip   Knee/Hip Exercises: Stretches   Active Hamstring Stretch Left;3 reps;20 seconds  at stairs   Knee/Hip Exercises: Aerobic   Stationary Bike Level 1, full revolutions x 6 minutes   Knee/Hip Exercises: Machines for Strengthening    Total Gym Leg Press St #8, 80# 1x10, 90# 2 x10, bil. 40# Lt LE 3x10,   incr weight to 45# with single leg   Knee/Hip Exercises: Standing   Lateral Step Up Left;2 sets;10 reps;Hand Hold:  1;Step Height: 8"   Forward Step Up Left;2 sets;10 reps;Hand Hold: 1;Step Height: 8"   Walking with Sports Cord 25# forward/reverse & Backward/reverse x 10 each   Modalities   Modalities Vasopneumatic   Vasopneumatic   Number Minutes Vasopneumatic  15 minutes   Vasopnuematic Location  Knee   Vasopneumatic Pressure Medium   Vasopneumatic Temperature  3 snowflakes                  PT Short Term Goals - 06/15/15 1659    PT SHORT TERM GOAL #1   Title be independent in initial HEP   Time 4   Period Weeks   Status Achieved   PT SHORT TERM GOAL #2   Title demonstrate Lt knee AROM extension to lacking < or = to 5 degrees to normalize gait pattern   Time 4   Period Weeks   Status Achieved   PT SHORT TERM GOAL #3   Title demonstrate Lt knee AROM flexion to > or = to 105 to allow for comfort with sitting without substitution   Time 4   Period Weeks   Status Achieved   PT SHORT TERM GOAL #4   Title demonstrate symmetry with ambulation on level surface   Time 4   Period Weeks   Status Achieved           PT Long Term Goals - 06/15/15 1659    PT LONG TERM GOAL #1   Title be independent in advanced HEP   Time 8   Period Weeks   Status On-going   PT LONG TERM GOAL #2   Title reduce FOTO to < or = to 45% limitation   Time 8   Period Weeks   Status On-going   PT LONG TERM GOAL #3   Title improve Lt LE strength and endurance walk for 30-45 minutes in the community without need to rest   Time 8   Period Weeks   Status On-going   PT LONG TERM GOAL #4   Title demonstrate Lt knee AROM flexion to 110 degrees to improve ability to squat   Time 8   Period Weeks   Status Achieved   PT LONG TERM GOAL #5   Title ascend and descend steps and curbs with step-over-step gait   Time 8    Period Weeks   Status On-going   PT LONG TERM GOAL #6   Title report a 60% reduction in Lt knee pain with standing and walking   Time 8   Period Weeks   Status Achieved               Plan - 06/15/15 1654    Clinical Impression Statement Pt still with weakness in left hip abductors and overall decreased functional strength left LE. AROM in degrees in Lt knee flexion 110, extension -4. Strength flex/ext 4+/5. Foto score 49%. Pt reports her overall improvement as 60%. Pt will continue to benefit from skilled PT for ROM, strength/endurance progression and edema managment s/p Lt TKA   Rehab Potential Good   PT Frequency 3x / week   PT Duration 8 weeks   PT Next Visit Plan continue with progressive knee flex and ext ROM;  quad strengthening in weight bearing   Consulted and Agree with Plan of Care Patient      Patient will benefit from skilled therapeutic intervention in order to improve the following deficits and impairments:  Abnormal gait, Decreased range of motion, Difficulty walking, Decreased  activity tolerance, Pain, Increased edema, Decreased strength, Impaired flexibility, Decreased scar mobility, Decreased mobility  Visit Diagnosis: Pain in left knee     Problem List Patient Active Problem List   Diagnosis Date Noted  . Obese 05/07/2015  . S/P left TKA 05/05/2015  . S/P knee replacement 05/05/2015  . Former smoker 07/10/2014  . Exertional shortness of breath 06/07/2014  . Chest pain 06/07/2014  . Osteoarthritis 05/16/2014  . Alcoholic fatty liver Q000111Q  . Chronic post-traumatic stress disorder (PTSD) 05/02/2014  . Sleep disorder 05/02/2014  . Family history of alcoholism 05/02/2014  . Macrocytosis 04/19/2014  . Fatigue 08/15/2013  . Thyroid nodule 09/17/2012  . Hypothyroid 07/21/2010  . Type II diabetes mellitus, well controlled ([place]) 06/23/2010  . HTN (hypertension) 06/23/2010  . Alcohol use disorder, severe, dependence ([place]) 06/23/2010  .  Hyperlipidemia 06/23/2010  . Obesity (BMI 30-39.9) 06/23/2010  . Chronic depression 06/23/2010  . History of colonic polyps 04/12/2010    Rivka Barbara, PTA 06/15/2015 5:14 PM  [facility] [address] [place], [place], [address] Phone: [phone]   Fax:  [phone]  Name: [name] [name] MRN: [id number] Date of Birth: [birth year]

## 2015-06-16 ENCOUNTER — Encounter: Payer: Self-pay | Admitting: Family Medicine

## 2015-06-16 ENCOUNTER — Ambulatory Visit (HOSPITAL_COMMUNITY): Payer: Self-pay | Admitting: Licensed Clinical Social Worker

## 2015-06-16 ENCOUNTER — Ambulatory Visit (INDEPENDENT_AMBULATORY_CARE_PROVIDER_SITE_OTHER): Payer: No Typology Code available for payment source | Admitting: Family Medicine

## 2015-06-16 VITALS — BP 126/70 | HR 70 | Temp 98.4°F | Wt 242.0 lb

## 2015-06-16 DIAGNOSIS — R300 Dysuria: Secondary | ICD-10-CM

## 2015-06-16 DIAGNOSIS — E119 Type 2 diabetes mellitus without complications: Secondary | ICD-10-CM | POA: Diagnosis not present

## 2015-06-16 LAB — POC URINALSYSI DIPSTICK (AUTOMATED)
Bilirubin, UA: NEGATIVE
Glucose, UA: NEGATIVE
Ketones, UA: NEGATIVE
Nitrite, UA: NEGATIVE
Spec Grav, UA: 1.025
Urobilinogen, UA: NEGATIVE
pH, UA: 5.5

## 2015-06-16 MED ORDER — CEPHALEXIN 500 MG PO CAPS: 500.0000 mg | ORAL_CAPSULE | Freq: Three times a day (TID) | ORAL | Status: DC

## 2015-06-16 MED ORDER — GLIMEPIRIDE 1 MG PO TABS: 1.0000 mg | ORAL_TABLET | Freq: Every day | ORAL | Status: DC

## 2015-06-16 NOTE — Progress Notes (Signed)
Subjective:  [name] [name] is a 60 y.o. year old very pleasant female patient who presents for/with See problem oriented charting ROS- no fever, chills, vomiting. Mild nausea. No suprapubic or CVA pain. No blood in urine.   Past Medical History-  Patient Active Problem List   Diagnosis Date Noted  . Alcoholic fatty liver Q000111Q    Priority: High  . Type II diabetes mellitus, well controlled ([place]) 06/23/2010    Priority: High  . Alcohol use disorder, severe, dependence ([place]) 06/23/2010    Priority: High  . Chronic depression 06/23/2010    Priority: High  . Hypothyroid 07/21/2010    Priority: Medium  . HTN (hypertension) 06/23/2010    Priority: Medium  . Hyperlipidemia 06/23/2010    Priority: Medium  . Former smoker 07/10/2014    Priority: Low  . Osteoarthritis 05/16/2014    Priority: Low  . Chronic post-traumatic stress disorder (PTSD) 05/02/2014    Priority: Low  . Sleep disorder 05/02/2014    Priority: Low  . Family history of alcoholism 05/02/2014    Priority: Low  . Macrocytosis 04/19/2014    Priority: Low  . Fatigue 08/15/2013    Priority: Low  . Thyroid nodule 09/17/2012    Priority: Low  . Obesity (BMI 30-39.9) 06/23/2010    Priority: Low  . History of colonic polyps 04/12/2010    Priority: Low  . Obese 05/07/2015  . S/P left TKA 05/05/2015  . S/P knee replacement 05/05/2015  . Exertional shortness of breath 06/07/2014  . Chest pain 06/07/2014    Medications- reviewed and updated Current Outpatient Prescriptions  Medication Sig Dispense Refill  . ACCU-CHEK FASTCLIX LANCETS MISC Use to test blood sugars daily. Dx: E11.9 100 each 11  . B Complex Vitamins (B COMPLEX 100 PO) Take 1 tablet by mouth daily.     Marland Kitchen buPROPion (WELLBUTRIN XL) 300 MG 24 hr tablet Take 1 tablet (300 mg total) by mouth daily. 90 tablet 3  . escitalopram (LEXAPRO) 20 MG tablet Take 1 tablet (20 mg total) by mouth daily. 90 tablet 3  . ferrous sulfate 325 (65 FE) MG tablet Take 1  tablet (325 mg total) by mouth 3 (three) times daily after meals.  3  . glucose blood (ACCU-CHEK COMPACT STRIPS) test strip Use to test blood sugars daily. Dx: E11.9 100 each 12  . JANUMET XR 50-1000 MG TB24 TAKE 2 TABLETS BY MOUTH DAILY. 180 tablet 3  . levothyroxine (SYNTHROID, LEVOTHROID) 88 MCG tablet Take 1 tablet (88 mcg total) by mouth daily. 90 tablet 3  . methocarbamol (ROBAXIN) 500 MG tablet Take 1 tablet (500 mg total) by mouth every 6 (six) hours as needed for muscle spasms. 50 tablet 0  . oxyCODONE (OXY IR/ROXICODONE) 5 MG immediate release tablet Take 1-3 tablets (5-15 mg total) by mouth every 4 (four) hours as needed for severe pain. 120 tablet 0  . thiamine (VITAMIN B-1) 50 MG tablet TAKE 1 TABLET BY MOUTH EVERY MORNING 100 tablet 1  . traZODone (DESYREL) 50 MG tablet TAKE 1 TABLET AT BEDTIME AS NEEDED,MAY REPEAT 1 TIME IF NEEDED FOR SLEEP 90 tablet 3  . zinc gluconate 50 MG tablet Take 50 mg by mouth daily.     No current facility-administered medications for this visit.    Objective: BP 126/70 mmHg  Pulse 70  Temp(Src) 98.4 F (36.9 C)  Wt 242 lb (109.77 kg) Gen: NAD, resting comfortably CV: RRR no murmurs rubs or gallops Lungs: CTAB no crackles, wheeze, rhonchi Abdomen: soft/nontender/nondistended/normal  bowel sounds. No rebound or guarding.  No suprapubic of  CVA pain Skin: warm, dry, no rash Neuro: grossly normal, moves all extremities  Results for orders placed or performed in visit on 06/16/15 (from the past 24 hour(s))  POCT Urinalysis Dipstick (Automated)     Status: Abnormal   Collection Time: 06/16/15  8:15 AM  Result Value Ref Range   Color, UA yellow    Clarity, UA cloudy    Glucose, UA neg    Bilirubin, UA neg    Ketones, UA neg    Spec Grav, UA 1.025    Blood, UA 3+    pH, UA 5.5    Protein, UA 2+    Urobilinogen, UA negative    Nitrite, UA neg    Leukocytes, UA large (3+) (A) Negative   Assessment/Plan:  Burning with urination - Plan: POCT  Urinalysis Dipstick (Automated), Urine culture S: 1 week of symptoms. Mainly beginning of stream. No vaginal discharge. Polyuria as well. Nocturia twice a night, normally once a night. Symptoms are worsening. No treatments tried.  A/P: 60 year old with diabetes with symptoms of UTI and findings on UA consistent. Last UTI about a year ago- pan sensitive and treated with 5 days of cipro. Treat with keflex for 7 days this time TID (GFR near 60 so will not do QID)  Type II diabetes mellitus, well controlled ([facility]) S: sugars creeping up to 120-150 range off amaryl 4mg  but on sitagliptin-metformin 50-1000mg  XL nightly.  A/P: We will add amaryl 1mg  back in.      Return precautions advised.   Orders Placed This Encounter  Procedures  . Urine culture    [facility]  . POCT Urinalysis Dipstick (Automated)   Meds ordered this encounter  Medications  . cephALEXin (KEFLEX) 500 MG capsule    Sig: Take 1 capsule (500 mg total) by mouth 3 (three) times daily.    Dispense:  21 capsule    Refill:  0  . glimepiride (AMARYL) 1 MG tablet    Sig: Take 1 tablet (1 mg total) by mouth daily with breakfast.    Dispense:  90 tablet    Refill:  3    [name], MD

## 2015-06-16 NOTE — Patient Instructions (Signed)
Keflex for 7 days- send for culture.  Take 3x a day  Follow up if fever, worsening symptoms or persists after antibiotics

## 2015-06-16 NOTE — Assessment & Plan Note (Signed)
S: sugars creeping up to 120-150 range off amaryl 4mg  but on sitagliptin-metformin 50-1000mg  XL nightly.  A/P: We will add amaryl 1mg  back in.

## 2015-06-17 ENCOUNTER — Ambulatory Visit: Payer: No Typology Code available for payment source

## 2015-06-17 DIAGNOSIS — M25562 Pain in left knee: Secondary | ICD-10-CM | POA: Diagnosis not present

## 2015-06-17 DIAGNOSIS — R262 Difficulty in walking, not elsewhere classified: Secondary | ICD-10-CM

## 2015-06-17 DIAGNOSIS — M6281 Muscle weakness (generalized): Secondary | ICD-10-CM

## 2015-06-17 DIAGNOSIS — R600 Localized edema: Secondary | ICD-10-CM

## 2015-06-17 DIAGNOSIS — M25662 Stiffness of left knee, not elsewhere classified: Secondary | ICD-10-CM

## 2015-06-17 NOTE — Therapy (Signed)
[facility] [address] [place], [place], [address] Phone: [phone]   Fax:  [phone]  Physical Therapy Treatment  Patient Details  Name: [name] [name] MRN: [id number] Date of Birth: [birth year] Referring Provider: Gerrit Halls PAC/ Paralee Cancel, MD  Encounter Date: 06/17/2015      PT End of Session - 06/17/15 1636    Visit Number 9   Date for PT Re-Evaluation 07/22/15   PT Start Time 1555   PT Stop Time 1650   PT Time Calculation (min) 55 min   Activity Tolerance Patient tolerated treatment well   Behavior During Therapy [facility] for tasks assessed/performed      Past Medical History  Diagnosis Date  . Depression   . Hypertension   . Hyperlipidemia   . Colon polyps   . Thyroid disease   . History of recurrent UTIs   . Anemia     menstrual related  . Diabetes mellitus 12/2009    type 2  . Alcohol problem drinking     rehab  . Acute meniscal tear of knee     Left knee  . Heart murmur   . Shortness of breath     with exertion   . Hypothyroidism   . Anxiety   . Diabetes mellitus, type II ([place])   . Liver disease   . 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 Evalluate for OSA (obstructive sleep apnea) 08/15/2013    No OSA on sleep study but may be underestimated.-never used cpap    . GERD (gastroesophageal reflux disease)     not currently taking.  . Arthritis     right ankle-neuropathy  . Neuromuscular disorder ([place])     neropathy bilateral feet.    Past Surgical History  Procedure Laterality Date  . Total knee arthroplasty  2009    right  . Cesarean section  1987  . Cholecystectomy    . Tubal ligation  1987  . Tonsillectomy    . Adenoidectomy    . Right rotator cuff surgery     . Right ankle surgery       x 2- no retained hardware  . Total knee revision Right 12/16/2013    Procedure: RIGHT TOTAL KNEE REVISION POLY EXCHANGE;  Surgeon: Mauri Pole, MD;  [facility]: [facility];  Service: Orthopedics;  Laterality: Right;  . Mass excision Left 12/16/2013    Procedure: EXCISION LEFT  DISTAL THIGH MASS;  Surgeon: Mauri Pole, MD;  [facility];  Service: Orthopedics;  Laterality: Left;  . Breast surgery Right     lumpectomy-benign  . Total knee arthroplasty Left 05/05/2015    Procedure: LEFT TOTAL KNEE ARTHROPLASTY;  Surgeon: Paralee Cancel, MD;  [facility];  Service: Orthopedics;  Laterality: Left;    There were no vitals filed for this visit.      Subjective Assessment - 06/17/15 1600    Subjective Pt saw MD today.  He was pleased with her progress.  Now allowed to drive.                           [facility] Adult PT Treatment/Exercise - 06/17/15 0001    Exercises   Exercises Knee/Hip   Knee/Hip Exercises: Stretches   Active Hamstring Stretch Left;3 reps;20 seconds  at stairs   Knee/Hip Exercises: Aerobic   Stationary Bike Level 3, full revolutions x 6 minutes  Knee/Hip Exercises: Machines for Strengthening   Total Gym Leg Press St #8,90# 3x10, 95# 3x10, bil. 45# Lt LE 3x10, 50# 2x20   Knee/Hip Exercises: Standing   Lateral Step Up Left;2 sets;10 reps;Hand Hold: 1;Step Height: 8"   Forward Step Up Left;2 sets;10 reps;Hand Hold: 1;Step Height: 8"   Walking with Sports Cord 25# forward/reverse & Backward/reverse x 10 each, 20# sidestepping x 10 each direction   Modalities   Modalities Vasopneumatic   Vasopneumatic   Number Minutes Vasopneumatic  15 minutes   Vasopnuematic Location  Knee   Vasopneumatic Pressure Medium   Vasopneumatic Temperature  3 snowflakes                  PT Short Term Goals - 06/15/15 1659    PT SHORT TERM GOAL #1   Title be independent in initial HEP   Time 4   Period Weeks   Status Achieved   PT SHORT TERM GOAL #2   Title demonstrate Lt knee AROM extension to lacking < or = to 5 degrees to normalize gait pattern   Time 4   Period Weeks   Status Achieved   PT  SHORT TERM GOAL #3   Title demonstrate Lt knee AROM flexion to > or = to 105 to allow for comfort with sitting without substitution   Time 4   Period Weeks   Status Achieved   PT SHORT TERM GOAL #4   Title demonstrate symmetry with ambulation on level surface   Time 4   Period Weeks   Status Achieved           PT Long Term Goals - 06/15/15 1659    PT LONG TERM GOAL #1   Title be independent in advanced HEP   Time 8   Period Weeks   Status On-going   PT LONG TERM GOAL #2   Title reduce FOTO to < or = to 45% limitation   Time 8   Period Weeks   Status On-going   PT LONG TERM GOAL #3   Title improve Lt LE strength and endurance walk for 30-45 minutes in the community without need to rest   Time 8   Period Weeks   Status On-going   PT LONG TERM GOAL #4   Title demonstrate Lt knee AROM flexion to 110 degrees to improve ability to squat   Time 8   Period Weeks   Status Achieved   PT LONG TERM GOAL #5   Title ascend and descend steps and curbs with step-over-step gait   Time 8   Period Weeks   Status On-going   PT LONG TERM GOAL #6   Title report a 60% reduction in Lt knee pain with standing and walking   Time 8   Period Weeks   Status Achieved               Plan - 06/17/15 1600    Clinical Impression Statement Pt with continued Lt hip weakness and decreased Lt LE stregnth.  Pt with Rt knee instability with sidestepping against resistance today. Lt knee AROM -4 to 110 degrees.  Pt reports 60% overall improvment.  Pt with localized edema in the Lt LE.  Pt will continue to benefit from skilled PT for Lt ROM, strength/endurance progreesion and edema management s/p Lt TKA.     Rehab Potential Good   PT Frequency 3x / week   PT Duration 8 weeks   PT Treatment/Interventions ADLs/Self Care Home Management;Cryotherapy;Dealer  Stimulation;Moist Heat;Therapeutic exercise;Therapeutic activities;Functional mobility training;Stair training;Gait  training;Ultrasound;Neuromuscular re-education;Patient/family education;Manual techniques;Vasopneumatic Device;Taping;Passive range of motion;Scar mobilization   PT Next Visit Plan continue with progressive knee flex and ext ROM;  quad strengthening in weight bearing   Consulted and Agree with Plan of Care Patient      Patient will benefit from skilled therapeutic intervention in order to improve the following deficits and impairments:  Abnormal gait, Decreased range of motion, Difficulty walking, Decreased activity tolerance, Pain, Increased edema, Decreased strength, Impaired flexibility, Decreased scar mobility, Decreased mobility  Visit Diagnosis: Pain in left knee  Difficulty in walking, not elsewhere classified  Localized edema  Stiffness of left knee, not elsewhere classified  Muscle weakness (generalized)     Problem List Patient Active Problem List   Diagnosis Date Noted  . Obese 05/07/2015  . S/P left TKA 05/05/2015  . S/P knee replacement 05/05/2015  . Former smoker 07/10/2014  . Exertional shortness of breath 06/07/2014  . Chest pain 06/07/2014  . Osteoarthritis 05/16/2014  . Alcoholic fatty liver Q000111Q  . Chronic post-traumatic stress disorder (PTSD) 05/02/2014  . Sleep disorder 05/02/2014  . Family history of alcoholism 05/02/2014  . Macrocytosis 04/19/2014  . Fatigue 08/15/2013  . Thyroid nodule 09/17/2012  . Hypothyroid 07/21/2010  . Type II diabetes mellitus, well controlled ([place]) 06/23/2010  . HTN (hypertension) 06/23/2010  . Alcohol use disorder, severe, dependence ([place]) 06/23/2010  . Hyperlipidemia 06/23/2010  . Obesity (BMI 30-39.9) 06/23/2010  . Chronic depression 06/23/2010  . History of colonic polyps 04/12/2010    Sigurd Sos, PT 06/17/2015 4:38 PM  [facility] [address], [place], [place], [address] Phone: [phone]   Fax:  [phone]  Name: [name] [name] MRN:  [id number] Date of Birth: 01/08/1956

## 2015-06-19 ENCOUNTER — Ambulatory Visit: Payer: No Typology Code available for payment source | Admitting: Physical Therapy

## 2015-06-19 DIAGNOSIS — M6281 Muscle weakness (generalized): Secondary | ICD-10-CM

## 2015-06-19 DIAGNOSIS — M25562 Pain in left knee: Secondary | ICD-10-CM | POA: Diagnosis not present

## 2015-06-19 DIAGNOSIS — M25662 Stiffness of left knee, not elsewhere classified: Secondary | ICD-10-CM

## 2015-06-19 DIAGNOSIS — R262 Difficulty in walking, not elsewhere classified: Secondary | ICD-10-CM

## 2015-06-19 DIAGNOSIS — R600 Localized edema: Secondary | ICD-10-CM

## 2015-06-19 LAB — URINE CULTURE: Colony Count: >=100000

## 2015-06-19 NOTE — Therapy (Signed)
[facility] [address], [place], [place], [address] Phone: [phone]   Fax:  [phone]  Physical Therapy Treatment  Patient Details  Name: [name] [name] MRN: [id number] Date of Birth: 09/13/1955 Referring Provider: Gerrit Halls PAC/ Paralee Cancel, MD  Encounter Date: 06/19/2015      PT End of Session - 06/19/15 1159    Visit Number 10   Date for PT Re-Evaluation 07/22/15   PT Start Time [id number]   PT Stop Time 1110   PT Time Calculation (min) 55 min   Activity Tolerance Patient tolerated treatment well      Past Medical History  Diagnosis Date  . Depression   . Hypertension   . Hyperlipidemia   . Colon polyps   . Thyroid disease   . History of recurrent UTIs   . Anemia     menstrual related  . Diabetes mellitus 12/2009    type 2  . Alcohol problem drinking     rehab  . Acute meniscal tear of knee     Left knee  . Heart murmur   . Shortness of breath     with exertion   . Hypothyroidism   . Anxiety   . Diabetes mellitus, type II ([place])   . Liver disease   . 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 Evalluate for OSA (obstructive sleep apnea) 08/15/2013    No OSA on sleep study but may be underestimated.-never used cpap    . GERD (gastroesophageal reflux disease)     not currently taking.  . Arthritis     right ankle-neuropathy  . Neuromuscular disorder ([place])     neropathy bilateral feet.    Past Surgical History  Procedure Laterality Date  . Total knee arthroplasty  2009    right  . Cesarean section  1987  . Cholecystectomy    . Tubal ligation  1987  . Tonsillectomy    . Adenoidectomy    . Right rotator cuff surgery     . Right ankle surgery       x 2- no retained hardware  . Total knee revision Right 12/16/2013    Procedure: RIGHT TOTAL KNEE REVISION POLY EXCHANGE;  Surgeon: Mauri Pole, MD;  Location: [facility];  Service: Orthopedics;   Laterality: Right;  . Mass excision Left 12/16/2013    Procedure: EXCISION LEFT  DISTAL THIGH MASS;  Surgeon: Mauri Pole, MD;  Location: [facility];  Service: Orthopedics;  Laterality: Left;  . Breast surgery Right     lumpectomy-benign  . Total knee arthroplasty Left 05/05/2015    Procedure: LEFT TOTAL KNEE ARTHROPLASTY;  Surgeon: Paralee Cancel, MD;  Location: [facility];  Service: Orthopedics;  Laterality: Left;    There were no vitals filed for this visit.      Subjective Assessment - 06/19/15 1026    Subjective The doctor said to keep taking the antibiotic to help with the redness of my scar and to call and ask for another prescription if it didn't clear with this pain.  Walked 5 hours shopping yesterday and took pain pill last night.  No pain meds needed this AM.   Currently in Pain? No/denies   Pain Score 0-No pain   Pain Location Knee   Pain Orientation Left   Pain Type Surgical pain   Pain Onset More than a month ago   Pain Frequency Intermittent  [facility] PT Assessment - 06/19/15 0001    AROM   Left Knee Extension 3   Left Knee Flexion 118                     [facility] Adult PT Treatment/Exercise - 06/19/15 0001    Knee/Hip Exercises: Aerobic   Stationary Bike full revolutions 8 min   Knee/Hip Exercises: Machines for Strengthening   Total Gym Leg Press seat 8; left only 50# 30x   Knee/Hip Exercises: Standing   Forward Step Up Left;10 reps;Hand Hold: 1;Step Height: 6"  with right foot chair touches   Other Standing Knee Exercises WB on surgery leg with green band hip ext and hip abd 10x each   Knee/Hip Exercises: Seated   Long Arc Quad Strengthening;Left;1 set;15 reps;Weights   Long Arc Quad Weight 5 lbs.   Other Seated Knee/Hip Exercises Green band HS curls 20x   Vasopneumatic   Number Minutes Vasopneumatic  15 minutes   Vasopnuematic Location  Knee   Vasopneumatic Pressure Medium   Vasopneumatic Temperature  3 snowflakes   Manual Therapy    Joint Mobilization knee flexion mobs grade 3 10x in sitting and supine   Soft tissue mobilization quads                  PT Short Term Goals - 06/19/15 1206    PT SHORT TERM GOAL #1   Title be independent in initial HEP   Status Achieved   PT SHORT TERM GOAL #2   Title demonstrate Lt knee AROM extension to lacking < or = to 5 degrees to normalize gait pattern   Status Achieved   PT SHORT TERM GOAL #3   Title demonstrate Lt knee AROM flexion to > or = to 105 to allow for comfort with sitting without substitution   Status Achieved   PT SHORT TERM GOAL #4   Title demonstrate symmetry with ambulation on level surface   Status Achieved           PT Long Term Goals - 06/19/15 1206    PT LONG TERM GOAL #1   Title be independent in advanced HEP   Period Weeks   Status On-going   PT LONG TERM GOAL #2   Title reduce FOTO to < or = to 45% limitation   Time 8   Period Weeks   Status On-going   PT LONG TERM GOAL #3   Title improve Lt LE strength and endurance walk for 30-45 minutes in the community without need to rest   Time 8   Period Weeks   Status On-going   PT LONG TERM GOAL #4   Title demonstrate Lt knee AROM flexion to 110 degrees to improve ability to squat   Status Achieved   PT LONG TERM GOAL #5   Title ascend and descend steps and curbs with step-over-step gait   Time 8   Period Weeks   Status On-going               Plan - 06/19/15 1200    Clinical Impression Statement The patient has improved left knee flexion AROM to 118 degrees after manual therapy.  Decreased proprioception and postural compensation wtih single leg standing.  Localized edema persists but decreased following vasocompression.  Patient reports mild muscle fatigue following session.  Therapist  assessing response throughout treatment session.  No production of pain.     PT Next Visit Plan continue with progressive knee flex and ext  ROM;  quad strengthening in weight bearing       Patient will benefit from skilled therapeutic intervention in order to improve the following deficits and impairments:     Visit Diagnosis: Pain in left knee  Difficulty in walking, not elsewhere classified  Localized edema  Stiffness of left knee, not elsewhere classified  Muscle weakness (generalized)     Problem List Patient Active Problem List   Diagnosis Date Noted  . Obese 05/07/2015  . S/P left TKA 05/05/2015  . S/P knee replacement 05/05/2015  . Former smoker 07/10/2014  . Exertional shortness of breath 06/07/2014  . Chest pain 06/07/2014  . Osteoarthritis 05/16/2014  . Alcoholic fatty liver Q000111Q  . Chronic post-traumatic stress disorder (PTSD) 05/02/2014  . Sleep disorder 05/02/2014  . Family history of alcoholism 05/02/2014  . Macrocytosis 04/19/2014  . Fatigue 08/15/2013  . Thyroid nodule 09/17/2012  . Hypothyroid 07/21/2010  . Type II diabetes mellitus, well controlled ([place]) 06/23/2010  . HTN (hypertension) 06/23/2010  . Alcohol use disorder, severe, dependence ([place]) 06/23/2010  . Hyperlipidemia 06/23/2010  . Obesity (BMI 30-39.9) 06/23/2010  . Chronic depression 06/23/2010  . History of colonic polyps 04/12/2010    Alvera Singh 06/19/2015, 12:07 PM  [facility] [address], [place], [place], [address] Phone: [phone]   Fax:  [phone]  Name: Tavon Elem MRN: [id number] Date of Birth: 01/16/1956    Ruben Im, PT 06/19/2015 12:08 PM Phone: [phone] Fax: [phone]

## 2015-06-23 ENCOUNTER — Ambulatory Visit (HOSPITAL_COMMUNITY): Payer: Self-pay | Admitting: Licensed Clinical Social Worker

## 2015-06-24 ENCOUNTER — Encounter: Payer: Self-pay | Admitting: Physical Therapy

## 2015-06-25 ENCOUNTER — Encounter: Payer: Self-pay | Admitting: Physical Therapy

## 2015-06-25 ENCOUNTER — Ambulatory Visit: Payer: No Typology Code available for payment source | Attending: Physician Assistant | Admitting: Physical Therapy

## 2015-06-25 DIAGNOSIS — M6281 Muscle weakness (generalized): Secondary | ICD-10-CM | POA: Insufficient documentation

## 2015-06-25 DIAGNOSIS — R262 Difficulty in walking, not elsewhere classified: Secondary | ICD-10-CM

## 2015-06-25 DIAGNOSIS — M25562 Pain in left knee: Secondary | ICD-10-CM | POA: Diagnosis not present

## 2015-06-25 DIAGNOSIS — M25662 Stiffness of left knee, not elsewhere classified: Secondary | ICD-10-CM | POA: Insufficient documentation

## 2015-06-25 DIAGNOSIS — R600 Localized edema: Secondary | ICD-10-CM | POA: Insufficient documentation

## 2015-06-25 NOTE — Therapy (Signed)
[facility] [address], [place], [place], [address] Phone: [phone]   Fax:  [phone]  Physical Therapy Treatment  Patient Details  Name: [name] [name] MRN: [id number] Date of Birth: Sep 06, 1955 Referring Provider: Gerrit Halls PAC/ Paralee Cancel, MD  Encounter Date: 06/25/2015      PT End of Session - 06/25/15 1411    Visit Number 11   Date for PT Re-Evaluation 07/22/15   PT Start Time [id number]   PT Stop Time 1500   PT Time Calculation (min) 58 min   Activity Tolerance Patient tolerated treatment well   Behavior During Therapy [facility] for tasks assessed/performed      Past Medical History  Diagnosis Date  . Depression   . Hypertension   . Hyperlipidemia   . Colon polyps   . Thyroid disease   . History of recurrent UTIs   . Anemia     menstrual related  . Diabetes mellitus 12/2009    type 2  . Alcohol problem drinking     rehab  . Acute meniscal tear of knee     Left knee  . Heart murmur   . Shortness of breath     with exertion   . Hypothyroidism   . Anxiety   . Diabetes mellitus, type II ([place])   . Liver disease   . 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 Evalluate for OSA (obstructive sleep apnea) 08/15/2013    No OSA on sleep study but may be underestimated.-never used cpap    . GERD (gastroesophageal reflux disease)     not currently taking.  . Arthritis     right ankle-neuropathy  . Neuromuscular disorder ([place])     neropathy bilateral feet.    Past Surgical History  Procedure Laterality Date  . Total knee arthroplasty  2009    right  . Cesarean section  1987  . Cholecystectomy    . Tubal ligation  1987  . Tonsillectomy    . Adenoidectomy    . Right rotator cuff surgery     . Right ankle surgery       x 2- no retained hardware  . Total knee revision Right 12/16/2013    Procedure: RIGHT TOTAL KNEE REVISION POLY EXCHANGE;  Surgeon: Mauri Pole, MD;  Location: [facility];  Service: Orthopedics;  Laterality: Right;  . Mass excision Left 12/16/2013    Procedure: EXCISION LEFT  DISTAL THIGH MASS;  Surgeon: Mauri Pole, MD;  Location: [facility];  Service: Orthopedics;  Laterality: Left;  . Breast surgery Right     lumpectomy-benign  . Total knee arthroplasty Left 05/05/2015    Procedure: LEFT TOTAL KNEE ARTHROPLASTY;  Surgeon: Paralee Cancel, MD;  [facility]: [facility];  Service: Orthopedics;  Laterality: Left;    There were no vitals filed for this visit.      Subjective Assessment - 06/25/15 1408    Subjective Pt finished her antibiotic treatment and observes the incision side closely. No c/o of pain, but reports balance deficits   Pertinent History Rt TKA- 2009, Lt TKA- 05/05/15   Limitations Sitting;Standing;Walking   How long can you sit comfortably? less than 1 hour without leg elevated   How long can you stand comfortably? 20 minutes    How long can you walk comfortably? 15 minutes   Patient Stated Goals improve gait, reduce Lt knee pain, improve AROM and strength   Currently in Pain?  No/denies                         [facility] Adult PT Treatment/Exercise - 06/25/15 0001    Ambulation/Gait   Ambulation/Gait Yes   Ambulation/Gait Assistance 6: Modified independent (Device/Increase time)   Ambulation Distance (Feet) 120 Feet   Gait Pattern Step-through pattern;Decreased stance time - left;Decreased step length - left   Ambulation Surface Level   Gait Comments pt with unsteady gait and decr knee flexion with swingphase   Exercises   Exercises Knee/Hip   Knee/Hip Exercises: Aerobic   Stationary Bike full revolutions 8 min   Knee/Hip Exercises: Machines for Strengthening   Total Gym Leg Press seat 8; B LE 100# 3  x10, left only 60# 30x  incr weight with B LE to 120   Knee/Hip Exercises: Standing   Lateral Step Up Left;2 sets;10 reps;Hand Hold: 1;Step Height: 8"   Forward Step Up Left;10 reps;Hand Hold: 1;Step  Height: 6"   Other Standing Knee Exercises WB on surgery leg with green band hip ext and hip abd 10x each   Knee/Hip Exercises: Seated   Long Arc Quad Strengthening;Left;1 set;15 reps;Weights   Long Arc Quad Weight 5 lbs.   Other Seated Knee/Hip Exercises Green band HS curls 20x   Modalities   Modalities Vasopneumatic   Vasopneumatic   Number Minutes Vasopneumatic  15 minutes   Vasopnuematic Location  Knee   Vasopneumatic Pressure Medium   Vasopneumatic Temperature  3 snowflakes   Manual Therapy   Joint Mobilization knee flexion mobs grade 3 10x in sitting and supine, patella mob   Soft tissue mobilization along incision side                  PT Short Term Goals - 06/25/15 1418    PT SHORT TERM GOAL #1   Title be independent in initial HEP   Time 4   Period Weeks   Status Achieved   PT SHORT TERM GOAL #2   Title demonstrate Lt knee AROM extension to lacking < or = to 5 degrees to normalize gait pattern   Time 4   Period Weeks   Status Achieved   PT SHORT TERM GOAL #3   Title demonstrate Lt knee AROM flexion to > or = to 105 to allow for comfort with sitting without substitution   Time 4   Period Weeks   Status Achieved   PT SHORT TERM GOAL #4   Title demonstrate symmetry with ambulation on level surface   Time 4   Period Weeks   Status Achieved           PT Long Term Goals - 06/25/15 1418    PT LONG TERM GOAL #1   Title be independent in advanced HEP   Time 8   Period Weeks   Status On-going   PT LONG TERM GOAL #2   Title reduce FOTO to < or = to 45% limitation   Time 8   Period Weeks   Status On-going   PT LONG TERM GOAL #3   Title improve Lt LE strength and endurance walk for 30-45 minutes in the community without need to rest   Time 8   Period Weeks   Status On-going   PT LONG TERM GOAL #4   Title demonstrate Lt knee AROM flexion to 110 degrees to improve ability to squat   Time 8   Period Weeks   Status Achieved  PT LONG TERM GOAL #5    Title ascend and descend steps and curbs with step-over-step gait   Time 8   Period Weeks   Status On-going   PT LONG TERM GOAL #6   Title report a 60% reduction in Lt knee pain with standing and walking   Time 8   Period Weeks   Status Achieved               Plan - 06/25/15 1411    Clinical Impression Statement Pt is recovering from a cold. Pt with decreased knee flexion with swing phase, focused on changing pattern, and presents with balance deficits with ambulation Pt able to increase weight with leg press to 60# with Left LE. SLS and proprioception is challenging. Edema improved since last week, but still persistent. Pt will conitnue to benefit from skilled PT  to improve strength, ROM, endurance and reduce edema    Rehab Potential Good   PT Frequency 3x / week   PT Duration 8 weeks   PT Treatment/Interventions ADLs/Self Care Home Management;Cryotherapy;Electrical Stimulation;Moist Heat;Therapeutic exercise;Therapeutic activities;Functional mobility training;Stair training;Gait training;Ultrasound;Neuromuscular re-education;Patient/family education;Manual techniques;Vasopneumatic Device;Taping;Passive range of motion;Scar mobilization   PT Next Visit Plan continue with progressive knee flex and ext ROM;  quad strengthening in weight bearing   Consulted and Agree with Plan of Care Patient      Patient will benefit from skilled therapeutic intervention in order to improve the following deficits and impairments:  Abnormal gait, Decreased range of motion, Difficulty walking, Decreased activity tolerance, Pain, Increased edema, Decreased strength, Impaired flexibility, Decreased scar mobility, Decreased mobility  Visit Diagnosis: Pain in left knee  Difficulty in walking, not elsewhere classified  Localized edema  Stiffness of left knee, not elsewhere classified  Muscle weakness (generalized)     Problem List Patient Active Problem List   Diagnosis Date Noted  .  Obese 05/07/2015  . S/P left TKA 05/05/2015  . S/P knee replacement 05/05/2015  . Former smoker 07/10/2014  . Exertional shortness of breath 06/07/2014  . Chest pain 06/07/2014  . Osteoarthritis 05/16/2014  . Alcoholic fatty liver Q000111Q  . Chronic post-traumatic stress disorder (PTSD) 05/02/2014  . Sleep disorder 05/02/2014  . Family history of alcoholism 05/02/2014  . Macrocytosis 04/19/2014  . Fatigue 08/15/2013  . Thyroid nodule 09/17/2012  . Hypothyroid 07/21/2010  . Type II diabetes mellitus, well controlled ([place]) 06/23/2010  . HTN (hypertension) 06/23/2010  . Alcohol use disorder, severe, dependence ([place]) 06/23/2010  . Hyperlipidemia 06/23/2010  . Obesity (BMI 30-39.9) 06/23/2010  . Chronic depression 06/23/2010  . History of colonic polyps 04/12/2010    NAUMANN-HOUEGNIFIO,Polk Minor PTA 06/25/2015, 2:49 PM  [facility] [address] [place], [place], [address] Phone: [phone]   Fax:  [phone]  Name: [name] [name] MRN: [id number] Date of Birth: [birth year]

## 2015-06-26 ENCOUNTER — Ambulatory Visit (INDEPENDENT_AMBULATORY_CARE_PROVIDER_SITE_OTHER): Payer: No Typology Code available for payment source | Admitting: Family Medicine

## 2015-06-26 ENCOUNTER — Encounter: Payer: Self-pay | Admitting: Family Medicine

## 2015-06-26 ENCOUNTER — Encounter: Payer: Self-pay | Admitting: Physical Therapy

## 2015-06-26 VITALS — BP 126/60 | HR 68 | Temp 99.5°F | Wt 239.0 lb

## 2015-06-26 DIAGNOSIS — E119 Type 2 diabetes mellitus without complications: Secondary | ICD-10-CM

## 2015-06-26 DIAGNOSIS — E039 Hypothyroidism, unspecified: Secondary | ICD-10-CM | POA: Diagnosis not present

## 2015-06-26 DIAGNOSIS — E785 Hyperlipidemia, unspecified: Secondary | ICD-10-CM | POA: Diagnosis not present

## 2015-06-26 DIAGNOSIS — R300 Dysuria: Secondary | ICD-10-CM

## 2015-06-26 LAB — TSH: TSH: 1.17 u[IU]/mL (ref 0.35–4.50)

## 2015-06-26 MED ORDER — GLIMEPIRIDE 2 MG PO TABS: 2.0000 mg | ORAL_TABLET | Freq: Every day | ORAL | Status: DC

## 2015-06-26 NOTE — Assessment & Plan Note (Signed)
S: controlled on levothryoxine 66mcg but may be overcontrolled- need updated tsh Lab Results  Component Value Date   TSH 0.18* 04/24/2015  A/P: check tsh today, adjust as needed

## 2015-06-26 NOTE — Progress Notes (Signed)
Subjective:  [name] [name] is a 60 y.o. year old very pleasant female patient who presents for/with See problem oriented charting ROS- no hypoglycemia. Mild epigastric pain improving- finish 2 months of prilosec. No chest pain or shortness of breath. No headache or blurry vision.   Past Medical History-  Patient Active Problem List   Diagnosis Date Noted  . Alcoholic fatty liver Q000111Q    Priority: High  . Type II diabetes mellitus, well controlled ([place]) 06/23/2010    Priority: High  . Alcohol use disorder, severe, dependence ([place]) 06/23/2010    Priority: High  . Chronic depression 06/23/2010    Priority: High  . Hypothyroid 07/21/2010    Priority: Medium  . HTN (hypertension) 06/23/2010    Priority: Medium  . Hyperlipidemia 06/23/2010    Priority: Medium  . Former smoker 07/10/2014    Priority: Low  . Osteoarthritis 05/16/2014    Priority: Low  . Chronic post-traumatic stress disorder (PTSD) 05/02/2014    Priority: Low  . Sleep disorder 05/02/2014    Priority: Low  . Family history of alcoholism 05/02/2014    Priority: Low  . Macrocytosis 04/19/2014    Priority: Low  . Fatigue 08/15/2013    Priority: Low  . Thyroid nodule 09/17/2012    Priority: Low  . Obesity (BMI 30-39.9) 06/23/2010    Priority: Low  . History of colonic polyps 04/12/2010    Priority: Low  . Obese 05/07/2015  . S/P left TKA 05/05/2015  . S/P knee replacement 05/05/2015  . Exertional shortness of breath 06/07/2014  . Chest pain 06/07/2014    Medications- reviewed and updated Current Outpatient Prescriptions  Medication Sig Dispense Refill  . ACCU-CHEK FASTCLIX LANCETS MISC Use to test blood sugars daily. Dx: E11.9 100 each 11  . B Complex Vitamins (B COMPLEX 100 PO) Take 1 tablet by mouth daily.     Marland Kitchen buPROPion (WELLBUTRIN XL) 300 MG 24 hr tablet Take 1 tablet (300 mg total) by mouth daily. 90 tablet 3  . escitalopram (LEXAPRO) 20 MG tablet Take 1 tablet (20 mg total) by mouth daily. 90  tablet 3  . ferrous sulfate 325 (65 FE) MG tablet Take 1 tablet (325 mg total) by mouth 3 (three) times daily after meals.  3  . glimepiride (AMARYL) 2 MG tablet Take 1 tablet (2 mg total) by mouth daily with breakfast. 90 tablet 3  . glucose blood (ACCU-CHEK COMPACT STRIPS) test strip Use to test blood sugars daily. Dx: E11.9 100 each 12  . JANUMET XR 50-1000 MG TB24 TAKE 2 TABLETS BY MOUTH DAILY. 180 tablet 3  . levothyroxine (SYNTHROID, LEVOTHROID) 88 MCG tablet Take 1 tablet (88 mcg total) by mouth daily. 90 tablet 3  . methocarbamol (ROBAXIN) 500 MG tablet Take 1 tablet (500 mg total) by mouth every 6 (six) hours as needed for muscle spasms. 50 tablet 0  . thiamine (VITAMIN B-1) 50 MG tablet TAKE 1 TABLET BY MOUTH EVERY MORNING 100 tablet 1  . traZODone (DESYREL) 50 MG tablet TAKE 1 TABLET AT BEDTIME AS NEEDED,MAY REPEAT 1 TIME IF NEEDED FOR SLEEP 90 tablet 3  . zinc gluconate 50 MG tablet Take 50 mg by mouth daily.    Marland Kitchen oxyCODONE (OXY IR/ROXICODONE) 5 MG immediate release tablet Take 1-3 tablets (5-15 mg total) by mouth every 4 (four) hours as needed for severe pain. (Patient not taking: Reported on 06/26/2015) 120 tablet 0   No current facility-administered medications for this visit.    Objective: BP 126/60  mmHg  Pulse 68  Temp(Src) 99.5 F (37.5 C)  Wt 239 lb (108.41 kg) Gen: NAD, resting comfortably CV: RRR no murmurs rubs or gallops Lungs: CTAB no crackles, wheeze, rhonchi Abdomen: soft/nontender- no pain in epigastric area/nondistended/normal bowel sounds. No rebound or guarding.  Ext: no edema Skin: warm, dry Neuro: grossly normal, moves all extremities  Assessment/Plan:  Type II diabetes mellitus, well controlled ([place]) S: suspect reasonably controlled (though might be up). On on amaryl 1mg  (started last visit due to slowly creeping up cbg), janumet XR 50-100mg  2 tablets daily CBGs- for last 2.5 weeks sugars have been 140s to 160s Lab Results  Component Value Date    HGBA1C 5.4 04/24/2015   HGBA1C 5.8 01/08/2015   HGBA1C 12.0* 10/06/2014   A/P: check a1c at follow up. Increase amaryl to 2mg  given continued sugars in 140-160 range in AM which is higher for her.   Hyperlipidemia S: well controlled on no statin. No myalgias.  Lab Results  Component Value Date   CHOL 173 04/24/2015   HDL 64.10 04/24/2015   LDLCALC 95 04/24/2015   LDLDIRECT 158.6 02/26/2014   TRIG 65.0 04/24/2015   CHOLHDL 3 04/24/2015   A/P: wonder if low TSH contributing- for now will remain off medicine and repeat next year to consider restart- previously off due to LFT elevation  Hypothyroid S: controlled on levothryoxine 97mcg but may be overcontrolled- need updated tsh Lab Results  Component Value Date   TSH 0.18* 04/24/2015  A/P: check tsh today, adjust as needed  Dysuria Persists after UTI though much lower level- will get urine culture to rule out incompletely cleared UTI after keflex  Has July follow up.  Return precautions advised.   Meds ordered this encounter  Medications  . glimepiride (AMARYL) 2 MG tablet    Sig: Take 1 tablet (2 mg total) by mouth daily with breakfast.    Dispense:  90 tablet    Refill:  3    [name], MD

## 2015-06-26 NOTE — Patient Instructions (Signed)
It is too early for a1c- let's increase amaryl to 2mg . Update me if any sugars below 80. Keep July appointment and we will update a1c then  Check thyroid with blood and urine to see if still infection

## 2015-06-26 NOTE — Assessment & Plan Note (Addendum)
S: suspect reasonably controlled (though might be up). On on amaryl 1mg  (started last visit due to slowly creeping up cbg), janumet XR 50-100mg  2 tablets daily CBGs- for last 2.5 weeks sugars have been 140s to 160s Lab Results  Component Value Date   HGBA1C 5.4 04/24/2015   HGBA1C 5.8 01/08/2015   HGBA1C 12.0* 10/06/2014   A/P: check a1c at follow up. Increase amaryl to 2mg  given continued sugars in 140-160 range in AM which is higher for her.

## 2015-06-26 NOTE — Assessment & Plan Note (Signed)
S: well controlled on no statin. No myalgias.  Lab Results  Component Value Date   CHOL 173 04/24/2015   HDL 64.10 04/24/2015   LDLCALC 95 04/24/2015   LDLDIRECT 158.6 02/26/2014   TRIG 65.0 04/24/2015   CHOLHDL 3 04/24/2015   A/P: wonder if low TSH contributing- for now will remain off medicine and repeat next year to consider restart- previously off due to LFT elevation

## 2015-06-26 NOTE — Addendum Note (Signed)
Addended by: Elmer Picker on: 06/26/2015 11:29 AM   Modules accepted: Orders

## 2015-06-27 ENCOUNTER — Other Ambulatory Visit: Payer: Self-pay | Admitting: Family Medicine

## 2015-06-28 LAB — URINE CULTURE: Colony Count: 30000

## 2015-06-29 ENCOUNTER — Encounter: Payer: Self-pay | Admitting: Physical Therapy

## 2015-06-29 ENCOUNTER — Ambulatory Visit: Payer: No Typology Code available for payment source | Admitting: Physical Therapy

## 2015-06-29 DIAGNOSIS — M25562 Pain in left knee: Secondary | ICD-10-CM

## 2015-06-29 DIAGNOSIS — R262 Difficulty in walking, not elsewhere classified: Secondary | ICD-10-CM

## 2015-06-29 DIAGNOSIS — M6281 Muscle weakness (generalized): Secondary | ICD-10-CM

## 2015-06-29 DIAGNOSIS — M25662 Stiffness of left knee, not elsewhere classified: Secondary | ICD-10-CM

## 2015-06-29 DIAGNOSIS — R600 Localized edema: Secondary | ICD-10-CM

## 2015-06-29 NOTE — Therapy (Signed)
[facility] [address], [place], [place], [address] Phone: [phone]   Fax:  [phone]  Physical Therapy Treatment  Patient Details  Name: [name] [name] MRN: [id number] Date of Birth: Mar 20, 1955 Referring Provider: Gerrit Halls PAC/ Paralee Cancel, MD  Encounter Date: 06/29/2015      PT End of Session - 06/29/15 1416    Visit Number 12   Date for PT Re-Evaluation 07/22/15   PT Start Time [id number]   PT Stop Time 1458   PT Time Calculation (min) 62 min   Equipment Utilized During Treatment Gait belt   Activity Tolerance Patient tolerated treatment well   Behavior During Therapy [facility] for tasks assessed/performed      Past Medical History  Diagnosis Date  . Depression   . Hypertension   . Hyperlipidemia   . Colon polyps   . Thyroid disease   . History of recurrent UTIs   . Anemia     menstrual related  . Diabetes mellitus 12/2009    type 2  . Alcohol problem drinking     rehab  . Acute meniscal tear of knee     Left knee  . Heart murmur   . Shortness of breath     with exertion   . Hypothyroidism   . Anxiety   . Diabetes mellitus, type II ([place])   . Liver disease   . 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 Evalluate for OSA (obstructive sleep apnea) 08/15/2013    No OSA on sleep study but may be underestimated.-never used cpap    . GERD (gastroesophageal reflux disease)     not currently taking.  . Arthritis     right ankle-neuropathy  . Neuromuscular disorder ([place])     neropathy bilateral feet.    Past Surgical History  Procedure Laterality Date  . Total knee arthroplasty  2009    right  . Cesarean section  1987  . Cholecystectomy    . Tubal ligation  1987  . Tonsillectomy    . Adenoidectomy    . Right rotator cuff surgery     . Right ankle surgery       x 2- no retained hardware  . Total knee revision Right 12/16/2013    Procedure: RIGHT TOTAL  KNEE REVISION POLY EXCHANGE;  Surgeon: Mauri Pole, MD;  [facility]: [facility];  Service: Orthopedics;  Laterality: Right;  . Mass excision Left 12/16/2013    Procedure: EXCISION LEFT  DISTAL THIGH MASS;  Surgeon: Mauri Pole, MD;  [facility]: [facility];  Service: Orthopedics;  Laterality: Left;  . Breast surgery Right     lumpectomy-benign  . Total knee arthroplasty Left 05/05/2015    Procedure: LEFT TOTAL KNEE ARTHROPLASTY;  Surgeon: Paralee Cancel, MD;  [facility];  Service: Orthopedics;  Laterality: Left;    There were no vitals filed for this visit.      Subjective Assessment - 06/29/15 1412    Subjective Pt continues to observe incision side closely, she is now 5 day's without Antibiotica. Pt was able to negotiate 2 flights of stairs at a friends house this weekend.    Pertinent History Rt TKA- 2009, Lt TKA- 05/05/15   Limitations Sitting;Standing;Walking   How long can you sit comfortably? less than 1 hour without leg elevated   How long can you walk comfortably? 15 minutes   Patient Stated Goals improve gait, reduce Lt knee pain, improve  AROM and strength   Currently in Pain? No/denies                         [facility] Adult PT Treatment/Exercise - 06/29/15 0001    Exercises   Exercises Knee/Hip   Knee/Hip Exercises: Aerobic   Stationary Bike full revolutions 8 min   Knee/Hip Exercises: Machines for Strengthening   Cybex Knee Extension 20#  x 10 B LE   Cybex Knee Flexion 35# x 40 with B LE   Total Gym Leg Press seat 8; B LE 120# 3 x10, left only 60# 30x 10   Knee/Hip Exercises: Standing   Lateral Step Up Left;2 sets;10 reps;Hand Hold: 1;Step Height: 6"  use 8" step   Forward Step Up Left;10 reps;Hand Hold: 1;Step Height: 6"   Other Standing Knee Exercises WB on surgery leg with green band hip ext and hip abd 10x each   Knee/Hip Exercises: Seated   Long Arc Quad Strengthening;Left;1 set;15 reps;Weights   Long Arc Quad Weight 5 lbs.   Modalities   Modalities  Vasopneumatic   Vasopneumatic   Number Minutes Vasopneumatic  15 minutes   Vasopnuematic Location  Knee   Vasopneumatic Pressure Medium   Vasopneumatic Temperature  3 snowflakes   Manual Therapy   Joint Mobilization knee flexion mobs grade 3 10x in sitting and supine, patella mob   Soft tissue mobilization along incision side                  PT Short Term Goals - 06/25/15 1418    PT SHORT TERM GOAL #1   Title be independent in initial HEP   Time 4   Period Weeks   Status Achieved   PT SHORT TERM GOAL #2   Title demonstrate Lt knee AROM extension to lacking < or = to 5 degrees to normalize gait pattern   Time 4   Period Weeks   Status Achieved   PT SHORT TERM GOAL #3   Title demonstrate Lt knee AROM flexion to > or = to 105 to allow for comfort with sitting without substitution   Time 4   Period Weeks   Status Achieved   PT SHORT TERM GOAL #4   Title demonstrate symmetry with ambulation on level surface   Time 4   Period Weeks   Status Achieved           PT Long Term Goals - 06/29/15 1424    PT LONG TERM GOAL #1   Title be independent in advanced HEP   Time 8   Period Weeks   Status On-going   PT LONG TERM GOAL #2   Title reduce FOTO to < or = to 45% limitation   Time 8   Period Weeks   Status On-going   PT LONG TERM GOAL #3   Title improve Lt LE strength and endurance walk for 30-45 minutes in the community without need to rest   Time 8   Period Weeks   Status On-going   PT LONG TERM GOAL #4   Title demonstrate Lt knee AROM flexion to 110 degrees to improve ability to squat   Time 8   Period Weeks   Status Achieved   PT LONG TERM GOAL #5   Title ascend and descend steps and curbs with step-over-step gait   Time 8   Period Weeks   Status On-going   PT LONG TERM GOAL #6   Title report a  60% reduction in Lt knee pain with standing and walking   Time 8   Period Weeks   Status Achieved               Plan - 06/29/15 1417     Clinical Impression Statement Pt with decreased knee flexion with swing phase, and presents with balance deficits with ambulation. Incision side appears with less redness and edema is reducing in left knee   Rehab Potential Good   PT Frequency 3x / week   PT Duration 8 weeks   PT Next Visit Plan continue with progressive knee flex and ext ROM;  quad strengthening in weight bearing   Consulted and Agree with Plan of Care Patient      Patient will benefit from skilled therapeutic intervention in order to improve the following deficits and impairments:  Abnormal gait, Decreased range of motion, Difficulty walking, Decreased activity tolerance, Pain, Increased edema, Decreased strength, Impaired flexibility, Decreased scar mobility, Decreased mobility  Visit Diagnosis: Pain in left knee  Difficulty in walking, not elsewhere classified  Localized edema  Stiffness of left knee, not elsewhere classified  Muscle weakness (generalized)     Problem List Patient Active Problem List   Diagnosis Date Noted  . Obese 05/07/2015  . S/P left TKA 05/05/2015  . S/P knee replacement 05/05/2015  . Former smoker 07/10/2014  . Exertional shortness of breath 06/07/2014  . Chest pain 06/07/2014  . Osteoarthritis 05/16/2014  . Alcoholic fatty liver Q000111Q  . Chronic post-traumatic stress disorder (PTSD) 05/02/2014  . Sleep disorder 05/02/2014  . Family history of alcoholism 05/02/2014  . Macrocytosis 04/19/2014  . Fatigue 08/15/2013  . Thyroid nodule 09/17/2012  . Hypothyroid 07/21/2010  . Type II diabetes mellitus, well controlled ([place]) 06/23/2010  . HTN (hypertension) 06/23/2010  . Alcohol use disorder, severe, dependence ([place]) 06/23/2010  . Hyperlipidemia 06/23/2010  . Obesity (BMI 30-39.9) 06/23/2010  . Chronic depression 06/23/2010  . History of colonic polyps 04/12/2010    NAUMANN-HOUEGNIFIO,Selda Jalbert PTA 06/29/2015, 2:44 PM   [facility] [address], [place], [place], [address] Phone: [phone]   Fax:  [phone]  Name: Terryn Senna MRN: [id number] Date of Birth: [birth year]

## 2015-06-30 ENCOUNTER — Ambulatory Visit (HOSPITAL_COMMUNITY): Payer: Self-pay | Admitting: Licensed Clinical Social Worker

## 2015-07-01 ENCOUNTER — Ambulatory Visit: Payer: No Typology Code available for payment source | Admitting: Physical Therapy

## 2015-07-01 ENCOUNTER — Encounter: Payer: Self-pay | Admitting: Physical Therapy

## 2015-07-01 DIAGNOSIS — R262 Difficulty in walking, not elsewhere classified: Secondary | ICD-10-CM

## 2015-07-01 DIAGNOSIS — R600 Localized edema: Secondary | ICD-10-CM

## 2015-07-01 DIAGNOSIS — M25562 Pain in left knee: Secondary | ICD-10-CM | POA: Diagnosis not present

## 2015-07-01 DIAGNOSIS — M6281 Muscle weakness (generalized): Secondary | ICD-10-CM

## 2015-07-01 DIAGNOSIS — M25662 Stiffness of left knee, not elsewhere classified: Secondary | ICD-10-CM

## 2015-07-01 NOTE — Therapy (Signed)
[facility] [address] [place], [place], [address] Phone: [phone]   Fax:  [phone]  Physical Therapy Treatment  Patient Details  Name: [name] [name] MRN: [id number] Date of Birth: 1955/07/16 Referring Provider: Gerrit Halls PAC/ Paralee Cancel, MD  Encounter Date: 07/01/2015      PT End of Session - 07/01/15 0952    Visit Number 13   Date for PT Re-Evaluation 07/22/15   PT Start Time 1025   PT Stop Time 1116   PT Time Calculation (min) 51 min   Activity Tolerance Patient tolerated treatment well   Behavior During Therapy [facility] for tasks assessed/performed      Past Medical History  Diagnosis Date  . Depression   . Hypertension   . Hyperlipidemia   . Colon polyps   . Thyroid disease   . History of recurrent UTIs   . Anemia     menstrual related  . Diabetes mellitus 12/2009    type 2  . Alcohol problem drinking     rehab  . Acute meniscal tear of knee     Left knee  . Heart murmur   . Shortness of breath     with exertion   . Hypothyroidism   . Anxiety   . Diabetes mellitus, type II ([place])   . Liver disease   . 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 Evalluate for OSA (obstructive sleep apnea) 08/15/2013    No OSA on sleep study but may be underestimated.-never used cpap    . GERD (gastroesophageal reflux disease)     not currently taking.  . Arthritis     right ankle-neuropathy  . Neuromuscular disorder ([employer])     neropathy bilateral feet.    Past Surgical History  Procedure Laterality Date  . Total knee arthroplasty  2009    right  . Cesarean section  1987  . Cholecystectomy    . Tubal ligation  1987  . Tonsillectomy    . Adenoidectomy    . Right rotator cuff surgery     . Right ankle surgery       x 2- no retained hardware  . Total knee revision Right 12/16/2013    Procedure: RIGHT TOTAL KNEE REVISION POLY EXCHANGE;  Surgeon: Mauri Pole, MD;  [facility];  Service: Orthopedics;  Laterality: Right;  . Mass excision Left 12/16/2013    Procedure: EXCISION LEFT  DISTAL THIGH MASS;  Surgeon: Mauri Pole, MD;  [facility];  Service: Orthopedics;  Laterality: Left;  . Breast surgery Right     lumpectomy-benign  . Total knee arthroplasty Left 05/05/2015    Procedure: LEFT TOTAL KNEE ARTHROPLASTY;  Surgeon: Paralee Cancel, MD;  [facility];  Service: Orthopedics;  Laterality: Left;    There were no vitals filed for this visit.      Subjective Assessment - 07/01/15 0948    Subjective Pt has no complain of pain, but notices incr heat in left knee.     Pertinent History Rt TKA- 2009, Lt TKA- 05/05/15   Limitations Sitting;Standing;Walking   How long can you sit comfortably? less than 1 hour without leg elevated   How long can you stand comfortably? 20 minutes    How long can you walk comfortably? 15 minutes   Patient Stated Goals improve gait, reduce Lt knee pain, improve AROM and strength   Currently in Pain? No/denies  [place] Adult PT Treatment/Exercise - 07/01/15 0001    Exercises   Exercises Knee/Hip   Knee/Hip Exercises: Aerobic   Stationary Bike full revolutions 8 min   Knee/Hip Exercises: Machines for Strengthening   Cybex Knee Extension 20#  2x 10 B LE   Cybex Knee Flexion 40# 3x10 with B LE   Cybex Leg Press 40# 3  x10   Total Gym Leg Press seat 8; B LE 120# 3 x10, left only 60# 30x 10  incr weight to 130# & 65#   Knee/Hip Exercises: Standing   Lateral Step Up Left;2 sets;10 reps;Hand Hold: 1;Step Height: 8"   Forward Step Up Left;Hand Hold: 1;20 reps;Step Height: 8"   Other Standing Knee Exercises WB on surgery leg with green band hip ext and hip abd 10x each   Other Standing Knee Exercises BOSU step up 2 x 10 with 1 UE support   Modalities   Modalities Vasopneumatic   Vasopneumatic   Number Minutes Vasopneumatic  15 minutes   Vasopnuematic Location   Knee   Vasopneumatic Pressure Medium   Vasopneumatic Temperature  3 snowflakes   Manual Therapy   Joint Mobilization knee flexion mobs grade 3 10x in sitting and supine, patella mob   Soft tissue mobilization along incision side                  PT Short Term Goals - 06/25/15 1418    PT SHORT TERM GOAL #1   Title be independent in initial HEP   Time 4   Period Weeks   Status Achieved   PT SHORT TERM GOAL #2   Title demonstrate Lt knee AROM extension to lacking < or = to 5 degrees to normalize gait pattern   Time 4   Period Weeks   Status Achieved   PT SHORT TERM GOAL #3   Title demonstrate Lt knee AROM flexion to > or = to 105 to allow for comfort with sitting without substitution   Time 4   Period Weeks   Status Achieved   PT SHORT TERM GOAL #4   Title demonstrate symmetry with ambulation on level surface   Time 4   Period Weeks   Status Achieved           PT Long Term Goals - 06/29/15 1424    PT LONG TERM GOAL #1   Title be independent in advanced HEP   Time 8   Period Weeks   Status On-going   PT LONG TERM GOAL #2   Title reduce FOTO to < or = to 45% limitation   Time 8   Period Weeks   Status On-going   PT LONG TERM GOAL #3   Title improve Lt LE strength and endurance walk for 30-45 minutes in the community without need to rest   Time 8   Period Weeks   Status On-going   PT LONG TERM GOAL #4   Title demonstrate Lt knee AROM flexion to 110 degrees to improve ability to squat   Time 8   Period Weeks   Status Achieved   PT LONG TERM GOAL #5   Title ascend and descend steps and curbs with step-over-step gait   Time 8   Period Weeks   Status On-going   PT LONG TERM GOAL #6   Title report a 60% reduction in Lt knee pain with standing and walking   Time 8   Period Weeks   Status Achieved  Plan - 07/01/15 0954    Clinical Impression Statement Pt's gait pattern is improving, balance is improving. Incision side with less  redness but increased temperature - continue to observe. Pt will conti ue to benefit from skilled PT to advance strength, endurance and balance   Rehab Potential Good   PT Frequency 3x / week   PT Duration 8 weeks   PT Treatment/Interventions ADLs/Self Care Home Management;Cryotherapy;Electrical Stimulation;Moist Heat;Therapeutic exercise;Therapeutic activities;Functional mobility training;Stair training;Gait training;Ultrasound;Neuromuscular re-education;Patient/family education;Manual techniques;Vasopneumatic Device;Taping;Passive range of motion;Scar mobilization   PT Next Visit Plan continue with progressive knee flex and ext ROM;  quad strengthening in weight bearing   Consulted and Agree with Plan of Care Patient      Patient will benefit from skilled therapeutic intervention in order to improve the following deficits and impairments:  Abnormal gait, Decreased range of motion, Difficulty walking, Decreased activity tolerance, Pain, Increased edema, Decreased strength, Impaired flexibility, Decreased scar mobility, Decreased mobility  Visit Diagnosis: Pain in left knee  Difficulty in walking, not elsewhere classified  Localized edema  Stiffness of left knee, not elsewhere classified  Muscle weakness (generalized)     Problem List Patient Active Problem List   Diagnosis Date Noted  . Obese 05/07/2015  . S/P left TKA 05/05/2015  . S/P knee replacement 05/05/2015  . Former smoker 07/10/2014  . Exertional shortness of breath 06/07/2014  . Chest pain 06/07/2014  . Osteoarthritis 05/16/2014  . Alcoholic fatty liver Q000111Q  . Chronic post-traumatic stress disorder (PTSD) 05/02/2014  . Sleep disorder 05/02/2014  . Family history of alcoholism 05/02/2014  . Macrocytosis 04/19/2014  . Fatigue 08/15/2013  . Thyroid nodule 09/17/2012  . Hypothyroid 07/21/2010  . Type II diabetes mellitus, well controlled ([place]) 06/23/2010  . HTN (hypertension) 06/23/2010  . Alcohol use  disorder, severe, dependence ([place]) 06/23/2010  . Hyperlipidemia 06/23/2010  . Obesity (BMI 30-39.9) 06/23/2010  . Chronic depression 06/23/2010  . History of colonic polyps 04/12/2010    NAUMANN-HOUEGNIFIO,Curlie Macken PTA 07/01/2015, 10:21 AM  [facility] [address] [place], [place], [address] Phone: [phone]   Fax:  [phone]  Name: Midna Korf MRN: [id number] Date of Birth: 12-13-1955

## 2015-07-06 ENCOUNTER — Encounter: Payer: Self-pay | Admitting: Physical Therapy

## 2015-07-06 ENCOUNTER — Ambulatory Visit: Payer: No Typology Code available for payment source | Admitting: Physical Therapy

## 2015-07-06 DIAGNOSIS — M25562 Pain in left knee: Secondary | ICD-10-CM

## 2015-07-06 DIAGNOSIS — R600 Localized edema: Secondary | ICD-10-CM

## 2015-07-06 DIAGNOSIS — M25662 Stiffness of left knee, not elsewhere classified: Secondary | ICD-10-CM

## 2015-07-06 DIAGNOSIS — R262 Difficulty in walking, not elsewhere classified: Secondary | ICD-10-CM

## 2015-07-06 DIAGNOSIS — M6281 Muscle weakness (generalized): Secondary | ICD-10-CM

## 2015-07-06 NOTE — Therapy (Signed)
[facility] [address], [place], [place], [address] Phone: [phone]   Fax:  [phone]  Physical Therapy Treatment  Patient Details  Name: [name] [name] MRN: [id number] Date of Birth: 15-Apr-1955 Referring Provider: Gerrit Halls PAC/ Paralee Cancel, MD  Encounter Date: 07/06/2015      PT End of Session - 07/06/15 1410    Visit Number 14   Date for PT Re-Evaluation 07/22/15   PT Start Time 1400   PT Stop Time 1502   PT Time Calculation (min) 62 min   Activity Tolerance Patient tolerated treatment well   Behavior During Therapy [facility] for tasks assessed/performed      Past Medical History  Diagnosis Date  . Depression   . Hypertension   . Hyperlipidemia   . Colon polyps   . Thyroid disease   . History of recurrent UTIs   . Anemia     menstrual related  . Diabetes mellitus 12/2009    type 2  . Alcohol problem drinking     rehab  . Acute meniscal tear of knee     Left knee  . Heart murmur   . Shortness of breath     with exertion   . Hypothyroidism   . Anxiety   . Diabetes mellitus, type II ([place])   . Liver disease   . 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 Evalluate for OSA (obstructive sleep apnea) 08/15/2013    No OSA on sleep study but may be underestimated.-never used cpap    . GERD (gastroesophageal reflux disease)     not currently taking.  . Arthritis     right ankle-neuropathy  . Neuromuscular disorder ([place])     neropathy bilateral feet.    Past Surgical History  Procedure Laterality Date  . Total knee arthroplasty  2009    right  . Cesarean section  1987  . Cholecystectomy    . Tubal ligation  1987  . Tonsillectomy    . Adenoidectomy    . Right rotator cuff surgery     . Right ankle surgery       x 2- no retained hardware  . Total knee revision Right 12/16/2013    Procedure: RIGHT TOTAL KNEE REVISION POLY EXCHANGE;  Surgeon: Mauri Pole, MD;  [facility];  Service: Orthopedics;  Laterality: Right;  . Mass excision Left 12/16/2013    Procedure: EXCISION LEFT  DISTAL THIGH MASS;  Surgeon: Mauri Pole, MD;  [facility];  Service: Orthopedics;  Laterality: Left;  . Breast surgery Right     lumpectomy-benign  . Total knee arthroplasty Left 05/05/2015    Procedure: LEFT TOTAL KNEE ARTHROPLASTY;  Surgeon: Paralee Cancel, MD;  [facility];  Service: Orthopedics;  Laterality: Left;    There were no vitals filed for this visit.      Subjective Assessment - 07/06/15 1408    Subjective Pt without complain of pain, the left knee appears with less heat scar is healing well less redness   Pertinent History Rt TKA- 2009, Lt TKA- 05/05/15   Limitations Sitting;Standing;Walking   How long can you sit comfortably? less than 1 hour without leg elevated   How long can you stand comfortably? 20 minutes    How long can you walk comfortably? 15 minutes   Patient Stated Goals improve gait, reduce Lt knee pain, improve AROM and strength   Currently in Pain? No/denies  [place] Adult PT Treatment/Exercise - 07/06/15 0001    Exercises   Exercises Knee/Hip   Knee/Hip Exercises: Aerobic   Stationary Bike full revolutions 8 min, 1.7 miles   Knee/Hip Exercises: Machines for Strengthening   Cybex Knee Extension 20#  2x 10 B LE   Cybex Knee Flexion 40# 3x10 with B LE   Cybex Leg Press 40# 3  x10   Total Gym Leg Press seat 8; B LE 125# 4 x10, left only 65# 3x 10  incr weight to 130# B LE   Knee/Hip Exercises: Standing   Lateral Step Up Left;2 sets;10 reps;Hand Hold: 1;Step Height: 8"   Forward Step Up Left;Hand Hold: 1;20 reps;Step Height: 8"   Step Down Right;2 sets;10 reps;Hand Hold: 2;Step Height: 6"  tapping down    Other Standing Knee Exercises WB on surgery leg with green band hip ext and hip abd 10x each   Other Standing Knee Exercises BOSU step up 2 x 10 with 1 UE support    Modalities   Modalities Vasopneumatic   Vasopneumatic   Number Minutes Vasopneumatic  15 minutes   Vasopnuematic Location  Knee   Vasopneumatic Pressure Medium   Vasopneumatic Temperature  3 snowflakes   Manual Therapy   Joint Mobilization knee flexion mobs grade 3 10x in sitting and supine, patella mob   Soft tissue mobilization along incision side                  PT Short Term Goals - 06/25/15 1418    PT SHORT TERM GOAL #1   Title be independent in initial HEP   Time 4   Period Weeks   Status Achieved   PT SHORT TERM GOAL #2   Title demonstrate Lt knee AROM extension to lacking < or = to 5 degrees to normalize gait pattern   Time 4   Period Weeks   Status Achieved   PT SHORT TERM GOAL #3   Title demonstrate Lt knee AROM flexion to > or = to 105 to allow for comfort with sitting without substitution   Time 4   Period Weeks   Status Achieved   PT SHORT TERM GOAL #4   Title demonstrate symmetry with ambulation on level surface   Time 4   Period Weeks   Status Achieved           PT Long Term Goals - 07/06/15 1412    PT LONG TERM GOAL #1   Title be independent in advanced HEP   Time 8   Period Weeks   Status On-going   PT LONG TERM GOAL #2   Title reduce FOTO to < or = to 45% limitation   Time 8   Period Weeks   Status On-going   PT LONG TERM GOAL #3   Title improve Lt LE strength and endurance walk for 30-45 minutes in the community without need to rest   Time 8   Period Weeks   Status On-going   PT LONG TERM GOAL #4   Title demonstrate Lt knee AROM flexion to 110 degrees to improve ability to squat   Time 8   Period Weeks   Status Achieved   PT LONG TERM GOAL #5   Title ascend and descend steps and curbs with step-over-step gait   Time 8   Period Weeks   Status On-going   PT LONG TERM GOAL #6   Title report a 60% reduction in Lt knee pain with standing and walking  Time 8   Period Weeks   Status Achieved               Plan -  07/06/15 1410    Clinical Impression Statement Pt continues to improve with ambulation and strength as noticed with incr weight on leg press. Scar on left knee is healing well less redness noticed. Pt will continue to benefit from skilled PT to advance strength, endurance and balance.    Rehab Potential Good   PT Frequency 3x / week   PT Duration 8 weeks   PT Next Visit Plan continue with progressive knee flex and ext ROM;  quad strengthening in weight bearing   Consulted and Agree with Plan of Care Patient      Patient will benefit from skilled therapeutic intervention in order to improve the following deficits and impairments:  Abnormal gait, Decreased range of motion, Difficulty walking, Decreased activity tolerance, Pain, Increased edema, Decreased strength, Impaired flexibility, Decreased scar mobility, Decreased mobility  Visit Diagnosis: Pain in left knee  Difficulty in walking, not elsewhere classified  Localized edema  Stiffness of left knee, not elsewhere classified  Muscle weakness (generalized)     Problem List Patient Active Problem List   Diagnosis Date Noted  . Obese 05/07/2015  . S/P left TKA 05/05/2015  . S/P knee replacement 05/05/2015  . Former smoker 07/10/2014  . Exertional shortness of breath 06/07/2014  . Chest pain 06/07/2014  . Osteoarthritis 05/16/2014  . Alcoholic fatty liver Q000111Q  . Chronic post-traumatic stress disorder (PTSD) 05/02/2014  . Sleep disorder 05/02/2014  . Family history of alcoholism 05/02/2014  . Macrocytosis 04/19/2014  . Fatigue 08/15/2013  . Thyroid nodule 09/17/2012  . Hypothyroid 07/21/2010  . Type II diabetes mellitus, well controlled ([place]) 06/23/2010  . HTN (hypertension) 06/23/2010  . Alcohol use disorder, severe, dependence ([place]) 06/23/2010  . Hyperlipidemia 06/23/2010  . Obesity (BMI 30-39.9) 06/23/2010  . Chronic depression 06/23/2010  . History of colonic polyps 04/12/2010    NAUMANN-HOUEGNIFIO,Niveah Boerner  PTA 07/06/2015, 2:49 PM  [facility] [address], [place], [place], [address] Phone: [phone]   Fax:  [phone]  Name: [name] [name] MRN: [id number] Date of Birth: Jun 04, 1955

## 2015-07-07 ENCOUNTER — Ambulatory Visit (HOSPITAL_COMMUNITY): Payer: Self-pay | Admitting: Licensed Clinical Social Worker

## 2015-07-09 ENCOUNTER — Encounter: Payer: Self-pay | Admitting: Physical Therapy

## 2015-07-09 ENCOUNTER — Ambulatory Visit: Payer: No Typology Code available for payment source | Admitting: Physical Therapy

## 2015-07-09 DIAGNOSIS — M25562 Pain in left knee: Secondary | ICD-10-CM

## 2015-07-09 DIAGNOSIS — R262 Difficulty in walking, not elsewhere classified: Secondary | ICD-10-CM

## 2015-07-09 DIAGNOSIS — M25662 Stiffness of left knee, not elsewhere classified: Secondary | ICD-10-CM

## 2015-07-09 DIAGNOSIS — R600 Localized edema: Secondary | ICD-10-CM

## 2015-07-09 DIAGNOSIS — M6281 Muscle weakness (generalized): Secondary | ICD-10-CM

## 2015-07-09 NOTE — Therapy (Signed)
[facility] [address], [place], [place], [address] Phone: [phone]   Fax:  [phone]  Physical Therapy Treatment  Patient Details  Name: [name] [name] MRN: [id number] Date of Birth: [birth year] Referring Provider: Gerrit Halls PAC/ Paralee Cancel, MD  Encounter Date: 07/09/2015      PT End of Session - 07/09/15 1415    Visit Number 15   Date for PT Re-Evaluation 07/22/15   PT Start Time [id number]   PT Stop Time 1500   PT Time Calculation (min) 62 min   Activity Tolerance Patient tolerated treatment well   Behavior During Therapy [facility] for tasks assessed/performed      Past Medical History  Diagnosis Date  . Depression   . Hypertension   . Hyperlipidemia   . Colon polyps   . Thyroid disease   . History of recurrent UTIs   . Anemia     menstrual related  . Diabetes mellitus 12/2009    type 2  . Alcohol problem drinking     rehab  . Acute meniscal tear of knee     Left knee  . Heart murmur   . Shortness of breath     with exertion   . Hypothyroidism   . Anxiety   . Diabetes mellitus, type II ([place])   . Liver disease   . 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 Evalluate for OSA (obstructive sleep apnea) 08/15/2013    No OSA on sleep study but may be underestimated.-never used cpap    . GERD (gastroesophageal reflux disease)     not currently taking.  . Arthritis     right ankle-neuropathy  . Neuromuscular disorder ([place])     neropathy bilateral feet.    Past Surgical History  Procedure Laterality Date  . Total knee arthroplasty  2009    right  . Cesarean section  1987  . Cholecystectomy    . Tubal ligation  1987  . Tonsillectomy    . Adenoidectomy    . Right rotator cuff surgery     . Right ankle surgery       x 2- no retained hardware  . Total knee revision Right 12/16/2013    Procedure: RIGHT TOTAL KNEE REVISION POLY EXCHANGE;  Surgeon: Mauri Pole, MD;  Location: [facility];  Service: Orthopedics;  Laterality: Right;  . Mass excision Left 12/16/2013    Procedure: EXCISION LEFT  DISTAL THIGH MASS;  Surgeon: Mauri Pole, MD;  Location: [facility];  Service: Orthopedics;  Laterality: Left;  . Breast surgery Right     lumpectomy-benign  . Total knee arthroplasty Left 05/05/2015    Procedure: LEFT TOTAL KNEE ARTHROPLASTY;  Surgeon: Paralee Cancel, MD;  [facility]: [facility];  Service: Orthopedics;  Laterality: Left;    There were no vitals filed for this visit.      Subjective Assessment - 07/09/15 1411    Subjective Pt has no complain of pain, but stiffness and slightly increased temperature in left knee - but normal healing process   Pertinent History Rt TKA- 2009, Lt TKA- 05/05/15   How long can you sit comfortably? less than 1 hour without leg elevated   How long can you stand comfortably? 20 minutes    How long can you walk comfortably? 15 minutes   Patient Stated Goals improve gait, reduce Lt knee pain, improve AROM and strength   Currently in Pain? No/denies  [place] Adult PT Treatment/Exercise - 07/09/15 0001    Exercises   Exercises Knee/Hip   Knee/Hip Exercises: Aerobic   Stationary Bike full revolutions 8 min, 1.7 miles   Knee/Hip Exercises: Machines for Strengthening   Cybex Knee Extension 20#  2x 10 B LE   Cybex Knee Flexion 45# 3x10 with B LE   Total Gym Leg Press seat 8; B LE 130# 4 x10, left only 65# 3x 10   Knee/Hip Exercises: Standing   Lateral Step Up Left;2 sets;10 reps;Hand Hold: 1;Step Height: 8"   Forward Step Up Left;Hand Hold: 1;20 reps;Step Height: 8"   Step Down Right;2 sets;10 reps;Hand Hold: 2;Step Height: 8"   Other Standing Knee Exercises WB on surgery leg with green band hip ext and hip abd 3x10x each   Modalities   Modalities Vasopneumatic   Vasopneumatic   Number Minutes Vasopneumatic  15 minutes   Vasopnuematic Location  Knee   Vasopneumatic Pressure High    Vasopneumatic Temperature  3 snowflakes   Manual Therapy   Joint Mobilization knee flexion mobs grade 3 10x in sitting and supine, patella mob   Soft tissue mobilization along incision side                  PT Short Term Goals - 06/25/15 1418    PT SHORT TERM GOAL #1   Title be independent in initial HEP   Time 4   Period Weeks   Status Achieved   PT SHORT TERM GOAL #2   Title demonstrate Lt knee AROM extension to lacking < or = to 5 degrees to normalize gait pattern   Time 4   Period Weeks   Status Achieved   PT SHORT TERM GOAL #3   Title demonstrate Lt knee AROM flexion to > or = to 105 to allow for comfort with sitting without substitution   Time 4   Period Weeks   Status Achieved   PT SHORT TERM GOAL #4   Title demonstrate symmetry with ambulation on level surface   Time 4   Period Weeks   Status Achieved           PT Long Term Goals - 07/06/15 1412    PT LONG TERM GOAL #1   Title be independent in advanced HEP   Time 8   Period Weeks   Status On-going   PT LONG TERM GOAL #2   Title reduce FOTO to < or = to 45% limitation   Time 8   Period Weeks   Status On-going   PT LONG TERM GOAL #3   Title improve Lt LE strength and endurance walk for 30-45 minutes in the community without need to rest   Time 8   Period Weeks   Status On-going   PT LONG TERM GOAL #4   Title demonstrate Lt knee AROM flexion to 110 degrees to improve ability to squat   Time 8   Period Weeks   Status Achieved   PT LONG TERM GOAL #5   Title ascend and descend steps and curbs with step-over-step gait   Time 8   Period Weeks   Status On-going   PT LONG TERM GOAL #6   Title report a 60% reduction in Lt knee pain with standing and walking   Time 8   Period Weeks   Status Achieved               Plan - 07/09/15 1418    Clinical Impression Statement  Pt continues to improve with strength as observed with of wieght on leg press now 130# with B LE, and ease of exercises  on 8' step, able to perform tapping down from 8" step. Pt will continue to benefit from skilled PT to advance strength, endurance and balance.    Rehab Potential Good   PT Frequency 3x / week   PT Duration 8 weeks   PT Treatment/Interventions ADLs/Self Care Home Management;Cryotherapy;Electrical Stimulation;Moist Heat;Therapeutic exercise;Therapeutic activities;Functional mobility training;Stair training;Gait training;Ultrasound;Neuromuscular re-education;Patient/family education;Manual techniques;Vasopneumatic Device;Taping;Passive range of motion;Scar mobilization   PT Next Visit Plan continue with progressive knee flex and ext ROM;  quad strengthening in weight bearing   Consulted and Agree with Plan of Care Patient      Patient will benefit from skilled therapeutic intervention in order to improve the following deficits and impairments:  Abnormal gait, Decreased range of motion, Difficulty walking, Decreased activity tolerance, Pain, Increased edema, Decreased strength, Impaired flexibility, Decreased scar mobility, Decreased mobility  Visit Diagnosis: Pain in left knee  Difficulty in walking, not elsewhere classified  Localized edema  Stiffness of left knee, not elsewhere classified  Muscle weakness (generalized)     Problem List Patient Active Problem List   Diagnosis Date Noted  . Obese 05/07/2015  . S/P left TKA 05/05/2015  . S/P knee replacement 05/05/2015  . Former smoker 07/10/2014  . Exertional shortness of breath 06/07/2014  . Chest pain 06/07/2014  . Osteoarthritis 05/16/2014  . Alcoholic fatty liver Q000111Q  . Chronic post-traumatic stress disorder (PTSD) 05/02/2014  . Sleep disorder 05/02/2014  . Family history of alcoholism 05/02/2014  . Macrocytosis 04/19/2014  . Fatigue 08/15/2013  . Thyroid nodule 09/17/2012  . Hypothyroid 07/21/2010  . Type II diabetes mellitus, well controlled ([place]) 06/23/2010  . HTN (hypertension) 06/23/2010  . Alcohol use  disorder, severe, dependence ([place]) 06/23/2010  . Hyperlipidemia 06/23/2010  . Obesity (BMI 30-39.9) 06/23/2010  . Chronic depression 06/23/2010  . History of colonic polyps 04/12/2010    NAUMANN-HOUEGNIFIO,Jaeline Whobrey PTA 07/09/2015, 2:43 PM  [facility] [address], [place], [place], [address] Phone: [phone]   Fax:  [phone]  Name: [name] [name] MRN: [id number] Date of Birth: December 02, 1955

## 2015-07-13 ENCOUNTER — Encounter: Payer: Self-pay | Admitting: Physical Therapy

## 2015-07-13 ENCOUNTER — Ambulatory Visit: Payer: No Typology Code available for payment source | Admitting: Physical Therapy

## 2015-07-13 DIAGNOSIS — R262 Difficulty in walking, not elsewhere classified: Secondary | ICD-10-CM

## 2015-07-13 DIAGNOSIS — M6281 Muscle weakness (generalized): Secondary | ICD-10-CM

## 2015-07-13 DIAGNOSIS — M25562 Pain in left knee: Secondary | ICD-10-CM

## 2015-07-13 DIAGNOSIS — M25662 Stiffness of left knee, not elsewhere classified: Secondary | ICD-10-CM

## 2015-07-13 DIAGNOSIS — R600 Localized edema: Secondary | ICD-10-CM

## 2015-07-13 NOTE — Therapy (Signed)
[facility] [address] [place], [place], [address] Phone: [phone]   Fax:  [phone]  Physical Therapy Treatment  Patient Details  Name: [name] [name] MRN: [id number] Date of Birth: [birth year] Referring Provider: Gerrit Halls PAC/ Paralee Cancel, MD  Encounter Date: 07/13/2015      PT End of Session - 07/13/15 1411    Visit Number 16   Date for PT Re-Evaluation 07/22/15   PT Start Time [id number]   PT Stop Time 1503   PT Time Calculation (min) 61 min   Activity Tolerance Patient tolerated treatment well   Behavior During Therapy [facility] for tasks assessed/performed      Past Medical History  Diagnosis Date  . Depression   . Hypertension   . Hyperlipidemia   . Colon polyps   . Thyroid disease   . History of recurrent UTIs   . Anemia     menstrual related  . Diabetes mellitus 12/2009    type 2  . Alcohol problem drinking     rehab  . Acute meniscal tear of knee     Left knee  . Heart murmur   . Shortness of breath     with exertion   . Hypothyroidism   . Anxiety   . Diabetes mellitus, type II ([place])   . Liver disease   . 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 Evalluate for OSA (obstructive sleep apnea) 08/15/2013    No OSA on sleep study but may be underestimated.-never used cpap    . GERD (gastroesophageal reflux disease)     not currently taking.  . Arthritis     right ankle-neuropathy  . Neuromuscular disorder ([place])     neropathy bilateral feet.    Past Surgical History  Procedure Laterality Date  . Total knee arthroplasty  2009    right  . Cesarean section  1987  . Cholecystectomy    . Tubal ligation  1987  . Tonsillectomy    . Adenoidectomy    . Right rotator cuff surgery     . Right ankle surgery       x 2- no retained hardware  . Total knee revision Right 12/16/2013    Procedure: RIGHT TOTAL KNEE REVISION POLY EXCHANGE;  Surgeon: Mauri Pole, MD;  [facility];  Service: Orthopedics;  Laterality: Right;  . Mass excision Left 12/16/2013    Procedure: EXCISION LEFT  DISTAL THIGH MASS;  Surgeon: Mauri Pole, MD;  [facility];  Service: Orthopedics;  Laterality: Left;  . Breast surgery Right     lumpectomy-benign  . Total knee arthroplasty Left 05/05/2015    Procedure: LEFT TOTAL KNEE ARTHROPLASTY;  Surgeon: Paralee Cancel, MD;  [facility];  Service: Orthopedics;  Laterality: Left;    There were no vitals filed for this visit.      Subjective Assessment - 07/13/15 1409    Subjective Pt has nocomplain of pain, stiffness is less in intensity and temperature in left knee is decreasing with healing process.    Pertinent History Rt TKA- 2009, Lt TKA- 05/05/15   Limitations Sitting;Standing;Walking   How long can you sit comfortably? less than 1 hour without leg elevated   How long can you stand comfortably? 20 minutes    How long can you walk comfortably? 15 minutes   Patient Stated Goals improve gait, reduce Lt knee pain, improve AROM and strength   Currently  in Pain? No/denies                         [facility] Adult PT Treatment/Exercise - 07/13/15 0001    Exercises   Exercises Knee/Hip   Knee/Hip Exercises: Aerobic   Stationary Bike full revolutions 8 min, 2.5 miles   Knee/Hip Exercises: Machines for Strengthening   Cybex Knee Extension 20#  2x 10 B LE   Cybex Knee Flexion 45# 3x10 with B LE   Total Gym Leg Press seat 8; B LE 130# 4 x10, left only 70# 3x 10  increase weight to 140# with B LE   Knee/Hip Exercises: Standing   Lateral Step Up Left;2 sets;10 reps;Hand Hold: 1;Step Height: 8"   Forward Step Up Left;Hand Hold: 1;20 reps;Step Height: 8"   Step Down Right;2 sets;10 reps;Hand Hold: 2;Step Height: 8"  tapping down   Rebounder 3 directions x 1 min each with B UE support   Other Standing Knee Exercises WB on surgery leg with green band hip ext and hip abd 3x10x each   Modalities    Modalities Vasopneumatic   Vasopneumatic   Number Minutes Vasopneumatic  15 minutes   Vasopnuematic Location  Knee   Vasopneumatic Pressure High   Vasopneumatic Temperature  3 snowflakes   Manual Therapy   Joint Mobilization --   Soft tissue mobilization --                  PT Short Term Goals - 06/25/15 1418    PT SHORT TERM GOAL #1   Title be independent in initial HEP   Time 4   Period Weeks   Status Achieved   PT SHORT TERM GOAL #2   Title demonstrate Lt knee AROM extension to lacking < or = to 5 degrees to normalize gait pattern   Time 4   Period Weeks   Status Achieved   PT SHORT TERM GOAL #3   Title demonstrate Lt knee AROM flexion to > or = to 105 to allow for comfort with sitting without substitution   Time 4   Period Weeks   Status Achieved   PT SHORT TERM GOAL #4   Title demonstrate symmetry with ambulation on level surface   Time 4   Period Weeks   Status Achieved           PT Long Term Goals - 07/13/15 1414    PT LONG TERM GOAL #1   Title be independent in advanced HEP   Time 8   Period Weeks   Status On-going   PT LONG TERM GOAL #2   Title reduce FOTO to < or = to 45% limitation   Time 8   Period Weeks   Status On-going   PT LONG TERM GOAL #3   Title improve Lt LE strength and endurance walk for 30-45 minutes in the community without need to rest   Time 8   Period Weeks   Status On-going   PT LONG TERM GOAL #4   Title demonstrate Lt knee AROM flexion to 110 degrees to improve ability to squat   Time 8   Period Weeks   Status Achieved   PT LONG TERM GOAL #5   Title ascend and descend steps and curbs with step-over-step gait   Time 8   Period Weeks   Status Achieved   PT LONG TERM GOAL #6   Title report a 60% reduction in Lt knee pain with standing  and walking   Time 8   Period Weeks   Status Achieved               Plan - 07/13/15 1411    Clinical Impression Statement Pt continues to improve with strength and gait  pattern. Pt will have 2 more visits before D/C to continue to work on strengt, ROM, balance and slight edema   Rehab Potential Good   PT Frequency 3x / week   PT Duration 8 weeks   PT Next Visit Plan 2 more visits of PT, continue with progressive knee flex quad strengthening   Consulted and Agree with Plan of Care Patient      Patient will benefit from skilled therapeutic intervention in order to improve the following deficits and impairments:  Abnormal gait, Decreased range of motion, Difficulty walking, Decreased activity tolerance, Pain, Increased edema, Decreased strength, Impaired flexibility, Decreased scar mobility, Decreased mobility  Visit Diagnosis: Pain in left knee  Difficulty in walking, not elsewhere classified  Localized edema  Stiffness of left knee, not elsewhere classified  Muscle weakness (generalized)     Problem List Patient Active Problem List   Diagnosis Date Noted  . Obese 05/07/2015  . S/P left TKA 05/05/2015  . S/P knee replacement 05/05/2015  . Former smoker 07/10/2014  . Exertional shortness of breath 06/07/2014  . Chest pain 06/07/2014  . Osteoarthritis 05/16/2014  . Alcoholic fatty liver Q000111Q  . Chronic post-traumatic stress disorder (PTSD) 05/02/2014  . Sleep disorder 05/02/2014  . Family history of alcoholism 05/02/2014  . Macrocytosis 04/19/2014  . Fatigue 08/15/2013  . Thyroid nodule 09/17/2012  . Hypothyroid 07/21/2010  . Type II diabetes mellitus, well controlled ([place]) 06/23/2010  . HTN (hypertension) 06/23/2010  . Alcohol use disorder, severe, dependence ([place]) 06/23/2010  . Hyperlipidemia 06/23/2010  . Obesity (BMI 30-39.9) 06/23/2010  . Chronic depression 06/23/2010  . History of colonic polyps 04/12/2010    NAUMANN-HOUEGNIFIO,Jatara Huettner PTA 07/13/2015, 2:45 PM  [facility] [address], [place], [place], [address] Phone: [phone]   Fax:   [phone]  Name: [name] [name] MRN: [id number] Date of Birth: 03/08/1955

## 2015-07-14 ENCOUNTER — Ambulatory Visit (HOSPITAL_COMMUNITY): Payer: Self-pay | Admitting: Licensed Clinical Social Worker

## 2015-07-16 ENCOUNTER — Ambulatory Visit: Payer: No Typology Code available for payment source

## 2015-07-16 DIAGNOSIS — R600 Localized edema: Secondary | ICD-10-CM

## 2015-07-16 DIAGNOSIS — M6281 Muscle weakness (generalized): Secondary | ICD-10-CM

## 2015-07-16 DIAGNOSIS — M25562 Pain in left knee: Secondary | ICD-10-CM | POA: Diagnosis not present

## 2015-07-16 DIAGNOSIS — M25662 Stiffness of left knee, not elsewhere classified: Secondary | ICD-10-CM

## 2015-07-16 NOTE — Therapy (Signed)
[facility] [address] [place], [place], [address] Phone: [phone]   Fax:  [phone]  Physical Therapy Treatment  Patient Details  Name: [name] [name] MRN: [id number] Date of Birth: 1956-02-03 Referring Provider: Gerrit Halls PAC/ Paralee Cancel, MD  Encounter Date: 07/16/2015      PT End of Session - 07/16/15 1432    Visit Number 65   PT Start Time 1400   PT Stop Time 1446   PT Time Calculation (min) 46 min   Activity Tolerance Patient tolerated treatment well   Behavior During Therapy [facility] for tasks assessed/performed      Past Medical History  Diagnosis Date  . Depression   . Hypertension   . Hyperlipidemia   . Colon polyps   . Thyroid disease   . History of recurrent UTIs   . Anemia     menstrual related  . Diabetes mellitus 12/2009    type 2  . Alcohol problem drinking     rehab  . Acute meniscal tear of knee     Left knee  . Heart murmur   . Shortness of breath     with exertion   . Hypothyroidism   . Anxiety   . Diabetes mellitus, type II ([place])   . Liver disease   . 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 Evalluate for OSA (obstructive sleep apnea) 08/15/2013    No OSA on sleep study but may be underestimated.-never used cpap    . GERD (gastroesophageal reflux disease)     not currently taking.  . Arthritis     right ankle-neuropathy  . Neuromuscular disorder ([place])     neropathy bilateral feet.    Past Surgical History  Procedure Laterality Date  . Total knee arthroplasty  2009    right  . Cesarean section  1987  . Cholecystectomy    . Tubal ligation  1987  . Tonsillectomy    . Adenoidectomy    . Right rotator cuff surgery     . Right ankle surgery       x 2- no retained hardware  . Total knee revision Right 12/16/2013    Procedure: RIGHT TOTAL KNEE REVISION POLY EXCHANGE;  Surgeon: Mauri Pole, MD;  [facility];  Service:  Orthopedics;  Laterality: Right;  . Mass excision Left 12/16/2013    Procedure: EXCISION LEFT  DISTAL THIGH MASS;  Surgeon: Mauri Pole, MD;  [facility];  Service: Orthopedics;  Laterality: Left;  . Breast surgery Right     lumpectomy-benign  . Total knee arthroplasty Left 05/05/2015    Procedure: LEFT TOTAL KNEE ARTHROPLASTY;  Surgeon: Paralee Cancel, MD;  [facility];  Service: Orthopedics;  Laterality: Left;    There were no vitals filed for this visit.      Subjective Assessment - 07/16/15 1409    Subjective Ready for D/C.     Pertinent History Rt TKA- 2009, Lt TKA- 05/05/15   Currently in Pain? No/denies            [facility] PT Assessment - 07/16/15 0001    Assessment   Medical Diagnosis s/p Lt TKA   Onset Date/Surgical Date 05/05/15   Observation/Other Assessments   Focus on Therapeutic Outcomes (FOTO)  47% limitation   AROM   Left Knee Extension 3   Left Knee Flexion 116   Strength   Overall Strength Deficits   Overall Strength  Comments Lt knee flexion 4+/5, extension 5/5, hip flexion 4+/5                     [facility] Adult PT Treatment/Exercise - 07/16/15 0001    Knee/Hip Exercises: Aerobic   Stationary Bike Level 2x 8 minutes   Knee/Hip Exercises: Machines for Strengthening   Cybex Knee Extension 20#  2x 10 B LE   Cybex Knee Flexion 45# 3x10 with B LE   Total Gym Leg Press seat 8; B LE 140# 4 x10, left only 70# 3x 10   Knee/Hip Exercises: Standing   Lateral Step Up Left;2 sets;10 reps;Hand Hold: 1;Step Height: 8"   Forward Step Up Left;Hand Hold: 1;20 reps;Step Height: 8"   Step Down Right;2 sets;10 reps;Hand Hold: 2;Step Height: 8"  tapping down   Rebounder 3 directions x 1 min each with B UE support   Modalities   Modalities Vasopneumatic   Vasopneumatic   Number Minutes Vasopneumatic  15 minutes   Vasopnuematic Location  Knee   Vasopneumatic Pressure High   Vasopneumatic Temperature  3 snowflakes                  PT  Short Term Goals - 06/25/15 1418    PT SHORT TERM GOAL #1   Title be independent in initial HEP   Time 4   Period Weeks   Status Achieved   PT SHORT TERM GOAL #2   Title demonstrate Lt knee AROM extension to lacking < or = to 5 degrees to normalize gait pattern   Time 4   Period Weeks   Status Achieved   PT SHORT TERM GOAL #3   Title demonstrate Lt knee AROM flexion to > or = to 105 to allow for comfort with sitting without substitution   Time 4   Period Weeks   Status Achieved   PT SHORT TERM GOAL #4   Title demonstrate symmetry with ambulation on level surface   Time 4   Period Weeks   Status Achieved           PT Long Term Goals - 07/16/15 1405    PT LONG TERM GOAL #1   Title be independent in advanced HEP   Status Achieved   PT LONG TERM GOAL #2   Title reduce FOTO to < or = to 45% limitation   Status Partially Met  47% limitation   PT LONG TERM GOAL #3   Title improve Lt LE strength and endurance walk for 30-45 minutes in the community without need to rest   Status Achieved   PT LONG TERM GOAL #4   Title demonstrate Lt knee AROM flexion to 110 degrees to improve ability to squat   Status Achieved   PT LONG TERM GOAL #5   Title ascend and descend steps and curbs with step-over-step gait   Status Achieved   PT LONG TERM GOAL #6   Title report a 60% reduction in Lt knee pain with standing and walking   Status Achieved               Plan - 07/16/15 1419    Clinical Impression Statement Pt is ready for D/C to HEP for continued strength gains.  FOTO score is 47% limitation.  Pt is able to walk > 2 hours in the community without significant limitation.  Pt will D/C to HEP.     PT Next Visit Plan D/C PT to HEP   Consulted and Agree  with Plan of Care Patient      Patient will benefit from skilled therapeutic intervention in order to improve the following deficits and impairments:     Visit Diagnosis: Localized edema  Stiffness of left knee, not  elsewhere classified  Muscle weakness (generalized)     Problem List Patient Active Problem List   Diagnosis Date Noted  . Obese 05/07/2015  . S/P left TKA 05/05/2015  . S/P knee replacement 05/05/2015  . Former smoker 07/10/2014  . Exertional shortness of breath 06/07/2014  . Chest pain 06/07/2014  . Osteoarthritis 05/16/2014  . Alcoholic fatty liver 23/00/9794  . Chronic post-traumatic stress disorder (PTSD) 05/02/2014  . Sleep disorder 05/02/2014  . Family history of alcoholism 05/02/2014  . Macrocytosis 04/19/2014  . Fatigue 08/15/2013  . Thyroid nodule 09/17/2012  . Hypothyroid 07/21/2010  . Type II diabetes mellitus, well controlled ([place]) 06/23/2010  . HTN (hypertension) 06/23/2010  . Alcohol use disorder, severe, dependence ([place]) 06/23/2010  . Hyperlipidemia 06/23/2010  . Obesity (BMI 30-39.9) 06/23/2010  . Chronic depression 06/23/2010  . History of colonic polyps 04/12/2010   PHYSICAL THERAPY DISCHARGE SUMMARY  Visits from Start of Care: 17  Current functional level related to goals / functional outcomes: See above   Remaining deficits: Pt denies any significant limitation.   Education / Equipment: HEP Plan: Patient agrees to discharge.  Patient goals were met. Patient is being discharged due to meeting the stated rehab goals.  ?????      Sigurd Sos, PT 07/16/2015 2:36 PM  [facility] [address] [place], [place], [address] Phone: [phone]   Fax:  [phone]  Name: Marylan Glore MRN: [id number] Date of Birth: [birth year]

## 2015-07-19 ENCOUNTER — Other Ambulatory Visit: Payer: Self-pay | Admitting: Family Medicine

## 2015-07-21 ENCOUNTER — Ambulatory Visit (HOSPITAL_COMMUNITY): Payer: Self-pay | Admitting: Licensed Clinical Social Worker

## 2015-07-21 ENCOUNTER — Ambulatory Visit: Payer: Self-pay | Admitting: Physical Therapy

## 2015-07-28 ENCOUNTER — Ambulatory Visit (HOSPITAL_COMMUNITY): Payer: Self-pay | Admitting: Licensed Clinical Social Worker

## 2015-07-29 NOTE — Telephone Encounter (Signed)
Medication is not currently on med list.  OV on 03/13/15 discussed continuing Lexapro 20mg .  Last OV 06/16/15, [place] history does show you filled this medication once, 07/10/14, #90 with 3 refills..... Okay to refill?

## 2015-07-30 ENCOUNTER — Telehealth: Payer: Self-pay | Admitting: General Practice

## 2015-07-30 NOTE — Telephone Encounter (Signed)
Patient no longer taking Escitalopram.  Called [facility] to cancel re-fill requests.

## 2015-07-30 NOTE — Telephone Encounter (Signed)
Yes thanks, may fill 

## 2015-08-04 ENCOUNTER — Ambulatory Visit (HOSPITAL_COMMUNITY): Payer: Self-pay | Admitting: Licensed Clinical Social Worker

## 2015-08-04 NOTE — Progress Notes (Signed)
Submitted PA through CoverMyMeds for escitalopram (LEXAPRO) 20 MG tablet.

## 2015-08-11 ENCOUNTER — Ambulatory Visit (HOSPITAL_COMMUNITY): Payer: Self-pay | Admitting: Licensed Clinical Social Worker

## 2015-08-18 ENCOUNTER — Ambulatory Visit (HOSPITAL_COMMUNITY): Payer: Self-pay | Admitting: Licensed Clinical Social Worker

## 2015-08-25 ENCOUNTER — Ambulatory Visit (HOSPITAL_COMMUNITY): Payer: Self-pay | Admitting: Licensed Clinical Social Worker

## 2015-08-26 ENCOUNTER — Ambulatory Visit (INDEPENDENT_AMBULATORY_CARE_PROVIDER_SITE_OTHER): Payer: No Typology Code available for payment source | Admitting: Family Medicine

## 2015-08-26 ENCOUNTER — Encounter: Payer: Self-pay | Admitting: Family Medicine

## 2015-08-26 VITALS — BP 116/62 | HR 76 | Temp 98.7°F | Ht 67.0 in | Wt 248.0 lb

## 2015-08-26 DIAGNOSIS — E119 Type 2 diabetes mellitus without complications: Secondary | ICD-10-CM | POA: Diagnosis not present

## 2015-08-26 DIAGNOSIS — F329 Major depressive disorder, single episode, unspecified: Secondary | ICD-10-CM

## 2015-08-26 DIAGNOSIS — I10 Essential (primary) hypertension: Secondary | ICD-10-CM

## 2015-08-26 DIAGNOSIS — G2581 Restless legs syndrome: Secondary | ICD-10-CM | POA: Diagnosis not present

## 2015-08-26 DIAGNOSIS — F32A Depression, unspecified: Secondary | ICD-10-CM

## 2015-08-26 DIAGNOSIS — E039 Hypothyroidism, unspecified: Secondary | ICD-10-CM

## 2015-08-26 DIAGNOSIS — K700 Alcoholic fatty liver: Secondary | ICD-10-CM

## 2015-08-26 LAB — CBC
HCT: 41.3 % (ref 36.0–46.0)
Hemoglobin: 14.1 g/dL (ref 12.0–15.0)
MCHC: 34.3 g/dL (ref 30.0–36.0)
MCV: 94.3 fl (ref 78.0–100.0)
Platelets: 190 10*3/uL (ref 150.0–400.0)
RBC: 4.38 Mil/uL (ref 3.87–5.11)
RDW: 14 % (ref 11.5–15.5)
WBC: 5.6 10*3/uL (ref 4.0–10.5)

## 2015-08-26 LAB — COMPREHENSIVE METABOLIC PANEL
ALT: 17 U/L (ref 0–35)
AST: 20 U/L (ref 0–37)
Albumin: 3.9 g/dL (ref 3.5–5.2)
Alkaline Phosphatase: 82 U/L (ref 39–117)
BUN: 18 mg/dL (ref 6–23)
CO2: 29 mEq/L (ref 19–32)
Calcium: 10 mg/dL (ref 8.4–10.5)
Chloride: 104 mEq/L (ref 96–112)
Creatinine, Ser: 1.11 mg/dL (ref 0.40–1.20)
GFR: 53.11 mL/min — ABNORMAL LOW (ref >60.00)
Glucose, Bld: 136 mg/dL — ABNORMAL HIGH (ref 70–99)
Potassium: 4.7 mEq/L (ref 3.5–5.1)
Sodium: 140 mEq/L (ref 135–145)
Total Bilirubin: 1.2 mg/dL (ref 0.2–1.2)
Total Protein: 6.5 g/dL (ref 6.0–8.3)

## 2015-08-26 LAB — TSH: TSH: 2.91 u[IU]/mL (ref 0.35–4.50)

## 2015-08-26 LAB — HEMOGLOBIN A1C: Hgb A1c MFr Bld: 6.1 % (ref 4.6–6.5)

## 2015-08-26 LAB — FERRITIN: Ferritin: 42.7 ng/mL (ref 10.0–291.0)

## 2015-08-26 MED ORDER — BUPROPION HCL ER (XL) 150 MG PO TB24: 150.0000 mg | ORAL_TABLET | Freq: Every day | ORAL | Status: DC

## 2015-08-26 NOTE — Progress Notes (Signed)
Pre visit review using our clinic review tool, if applicable. No additional management support is needed unless otherwise documented below in the visit note. 

## 2015-08-26 NOTE — Patient Instructions (Signed)
Check labs before you leave  Go down on wellbutrin to 150mg - if worsening depression symptoms or anxiety please let me know immediately. If thoughts of hurting yourself, seek care immediately- 911 or call us.   Follow up 1 month- cancel Tuesday visit before you leave  Look to see if Letta Moynahan is covered by your insurance or if you have other psychiatric options

## 2015-08-26 NOTE — Progress Notes (Signed)
Subjective:  [name] [name] is a 60 y.o. year old very pleasant female patient who presents for/with See problem oriented charting ROS- no fever, chills. No chest pain or shortness of breath. No abdominal pain.see any ROS included in HPI as well.   Past Medical History-  Patient Active Problem List   Diagnosis Date Noted  . Alcoholic fatty liver Q000111Q    Priority: High  . Type II diabetes mellitus, well controlled ([employer]) 06/23/2010    Priority: High  . Alcohol use disorder, severe, dependence ([place]) 06/23/2010    Priority: High  . Chronic depression 06/23/2010    Priority: High  . Hypothyroid 07/21/2010    Priority: Medium  . HTN (hypertension) 06/23/2010    Priority: Medium  . Hyperlipidemia 06/23/2010    Priority: Medium  . Former smoker 07/10/2014    Priority: Low  . Osteoarthritis 05/16/2014    Priority: Low  . Chronic post-traumatic stress disorder (PTSD) 05/02/2014    Priority: Low  . Sleep disorder 05/02/2014    Priority: Low  . Family history of alcoholism 05/02/2014    Priority: Low  . Macrocytosis 04/19/2014    Priority: Low  . Fatigue 08/15/2013    Priority: Low  . Thyroid nodule 09/17/2012    Priority: Low  . Obesity (BMI 30-39.9) 06/23/2010    Priority: Low  . History of colonic polyps 04/12/2010    Priority: Low  . Obese 05/07/2015  . S/P left TKA 05/05/2015  . S/P knee replacement 05/05/2015  . Exertional shortness of breath 06/07/2014  . Chest pain 06/07/2014    Medications- reviewed and updated Current Outpatient Prescriptions  Medication Sig Dispense Refill  . ACCU-CHEK FASTCLIX LANCETS MISC Use to test blood sugars daily. Dx: E11.9 100 each 11  . B Complex Vitamins (B COMPLEX 100 PO) Take 1 tablet by mouth daily.     . benazepril-hydrochlorthiazide (LOTENSIN HCT) 20-12.5 MG tablet TAKE 1 TABLET BY MOUTH TWICE A DAY 60 tablet 5  . buPROPion (WELLBUTRIN XL) 300 MG 24 hr tablet Take 1 tablet (300 mg total) by mouth daily. 90 tablet 3  .  escitalopram (LEXAPRO) 20 MG tablet TAKE 1 TABLET BY MOUTH EVERY DAY 90 tablet 1  . ferrous sulfate 325 (65 FE) MG tablet Take 1 tablet (325 mg total) by mouth 3 (three) times daily after meals.  3  . glimepiride (AMARYL) 2 MG tablet Take 1 tablet (2 mg total) by mouth daily with breakfast. 90 tablet 3  . glucose blood (ACCU-CHEK COMPACT STRIPS) test strip Use to test blood sugars daily. Dx: E11.9 100 each 12  . JANUMET XR 50-1000 MG TB24 TAKE 2 TABLETS BY MOUTH DAILY. 180 tablet 3  . levothyroxine (SYNTHROID, LEVOTHROID) 88 MCG tablet Take 1 tablet (88 mcg total) by mouth daily. 90 tablet 3  . methocarbamol (ROBAXIN) 500 MG tablet Take 1 tablet (500 mg total) by mouth every 6 (six) hours as needed for muscle spasms. 50 tablet 0  . thiamine (VITAMIN B-1) 50 MG tablet TAKE 1 TABLET BY MOUTH EVERY MORNING 100 tablet 1  . traZODone (DESYREL) 50 MG tablet TAKE 1 TABLET AT BEDTIME AS NEEDED,MAY REPEAT 1 TIME IF NEEDED FOR SLEEP 90 tablet 3  . zinc gluconate 50 MG tablet Take 50 mg by mouth daily.     No current facility-administered medications for this visit.    Objective: BP 116/62 mmHg  Pulse 76  Temp(Src) 98.7 F (37.1 C) (Oral)  Ht 5\' 7"  (1.702 m)  Wt 248 lb (112.492  kg)  BMI 38.83 kg/m2  SpO2 94% Gen: NAD, resting comfortably CV: RRR no murmurs rubs or gallops Lungs: CTAB no crackles, wheeze, rhonchi Abdomen: soft/nontender/nondistended/normal bowel sounds. Obese Ext: no edema Skin: warm, dry, no rash Neuro: grossly normal, moves all extremities   Assessment/Plan:   Chronic depression GAD S: Starting to get irritated again less than a month. Things used to not bother her and now her tolerance is thinning considerably. Taking lexapro 20mg  and wellbutrin 300mg  XL. First started with trazodone went to sleep within 10 minutes- now her mind it is difficult to shut it down. Feels shakier than normal. Having trouble with muscle cramps in legs- robaxin at night tends to help with cramps.  Does not take MV. Some restless legs in the evening.  A/P: PHQ9 of 11 but 0 on #1, 1 on #2 whereas GAD7 of 17. No SI/HI.  Irritability larger issue- I wonder if she would benefit from cutting down wellbutrin which can be anxiety provoking- previously she had this uptitrated for depression but depression seems to be doing better. Patient had transferred meds to me from psychiatry once stabilized and I planned to continue while stable- if cannot help her with this transition- need to get her plugged back into psychiatry and I encouraged her to go ahead and check into options- [facility] to explore. Follow up 1 month. Electrolytes were normal in regards to cramps- discussed some home remedies as not on statin. Also checked TSH with irritability and fortunately remained in normal range.   Type II diabetes mellitus, well controlled ([place]) S: tolerating amaryl 1mg , janumet 50-1000mg  XL 2 tablets daily. Has been eating much more sugar- fortunately- no signficant spike noted Lab Results  Component Value Date   HGBA1C 6.1 08/26/2015  A/P: encouraged cutting out excess sugars and remaining on current medications   Alcoholic fatty liver S: remains alcohol free will be 18 months 11/14/15. Liver function tests have completely normalized- still needs weight loss A/P: continue to monitor LFTs ever 3-6 months but for first time even bilirubin has normalized which is great news   Hypothyroid S: well controlled on levothyroxine 65mcg Lab Results  Component Value Date   TSH 2.91 08/26/2015  A/P: continue current medications    1 month  Orders Placed This Encounter  Procedures  . TSH    [place]  . CBC    [place]  . Comprehensive metabolic panel    [place]  . Hemoglobin A1c      . Ferritin    Meds ordered this encounter  Medications  . buPROPion (WELLBUTRIN XL) 150 MG 24 hr tablet    Sig: Take 1 tablet (150 mg total) by mouth daily.    Dispense:  30 tablet    Refill:  5     Return precautions advised.  [name], MD

## 2015-08-27 NOTE — Assessment & Plan Note (Signed)
S: well controlled on levothyroxine 60mcg Lab Results  Component Value Date   TSH 2.91 08/26/2015  A/P: continue current medications

## 2015-08-27 NOTE — Assessment & Plan Note (Signed)
S: tolerating amaryl 1mg , janumet 50-1000mg  XL 2 tablets daily. Has been eating much more sugar- fortunately- no signficant spike noted Lab Results  Component Value Date   HGBA1C 6.1 08/26/2015  A/P: encouraged cutting out excess sugars and remaining on current medications

## 2015-08-27 NOTE — Assessment & Plan Note (Addendum)
GAD S: Starting to get irritated again less than a month. Things used to not bother her and now her tolerance is thinning considerably. Taking lexapro 20mg  and wellbutrin 300mg  XL. First started with trazodone went to sleep within 10 minutes- now her mind it is difficult to shut it down. Feels shakier than normal. Having trouble with muscle cramps in legs- robaxin at night tends to help with cramps. Does not take MV. Some restless legs in the evening.  A/P: PHQ9 of 11 but 0 on #1, 1 on #2 whereas GAD7 of 17. No SI/HI.  Irritability larger issue- I wonder if she would benefit from cutting down wellbutrin which can be anxiety provoking- previously she had this uptitrated for depression but depression seems to be doing better. Patient had transferred meds to me from psychiatry once stabilized and I planned to continue while stable- if cannot help her with this transition- need to get her plugged back into psychiatry and I encouraged her to go ahead and check into options- [facility] to explore. Follow up 1 month. Electrolytes were normal in regards to cramps- discussed some home remedies as not on statin. Also checked TSH with irritability and fortunately remained in normal range.

## 2015-08-27 NOTE — Assessment & Plan Note (Signed)
S: remains alcohol free will be 18 months 11/14/15. Liver function tests have completely normalized- still needs weight loss A/P: continue to monitor LFTs ever 3-6 months but for first time even bilirubin has normalized which is great news

## 2015-08-31 ENCOUNTER — Ambulatory Visit: Payer: Self-pay | Admitting: Family Medicine

## 2015-09-01 ENCOUNTER — Ambulatory Visit (HOSPITAL_COMMUNITY): Payer: Self-pay | Admitting: Licensed Clinical Social Worker

## 2015-09-01 ENCOUNTER — Telehealth: Payer: Self-pay

## 2015-09-02 ENCOUNTER — Telehealth: Payer: Self-pay | Admitting: *Deleted

## 2015-09-02 ENCOUNTER — Other Ambulatory Visit: Payer: Self-pay | Admitting: *Deleted

## 2015-09-02 MED ORDER — BUPROPION HCL ER (XL) 150 MG PO TB24: 150.0000 mg | ORAL_TABLET | Freq: Every day | ORAL | Status: DC

## 2015-09-02 NOTE — Telephone Encounter (Signed)
Didn't I already respond to this? May have been different format- I cannot understand why she needs refill if I gave her a year in February but yes you may fill

## 2015-09-02 NOTE — Telephone Encounter (Signed)
Yes thanks. Not really sure she needs one. In February 2017 did 90 with 3 refills it looks like. But may refill

## 2015-09-02 NOTE — Telephone Encounter (Signed)
Pharmacy requesting 90 day supply

## 2015-09-02 NOTE — Telephone Encounter (Signed)
[facility] [address] sent request for Trazodone 50 mg tabs. Last Rxd 04-20-15 #90 with 3 refills. Last seen by Dr. Yong Channel 08-26-15

## 2015-09-03 ENCOUNTER — Other Ambulatory Visit: Payer: Self-pay

## 2015-09-03 MED ORDER — TRAZODONE HCL 50 MG PO TABS: ORAL_TABLET | ORAL | Status: DC

## 2015-09-04 ENCOUNTER — Other Ambulatory Visit: Payer: Self-pay | Admitting: Emergency Medicine

## 2015-09-08 ENCOUNTER — Ambulatory Visit (HOSPITAL_COMMUNITY): Payer: Self-pay | Admitting: Licensed Clinical Social Worker

## 2015-09-10 ENCOUNTER — Other Ambulatory Visit: Payer: Self-pay | Admitting: General Practice

## 2015-09-10 MED ORDER — BENAZEPRIL-HYDROCHLOROTHIAZIDE 20-12.5 MG PO TABS: 1.0000 | ORAL_TABLET | Freq: Two times a day (BID) | ORAL | Status: DC

## 2015-09-10 NOTE — Telephone Encounter (Signed)
Rx sent 7/13 

## 2015-09-22 ENCOUNTER — Ambulatory Visit (HOSPITAL_COMMUNITY): Payer: Self-pay | Admitting: Licensed Clinical Social Worker

## 2015-09-28 ENCOUNTER — Encounter: Payer: Self-pay | Admitting: Family Medicine

## 2015-09-28 ENCOUNTER — Ambulatory Visit (INDEPENDENT_AMBULATORY_CARE_PROVIDER_SITE_OTHER): Payer: No Typology Code available for payment source | Admitting: Family Medicine

## 2015-09-28 ENCOUNTER — Telehealth: Payer: Self-pay

## 2015-09-28 VITALS — BP 138/68 | HR 74 | Temp 98.2°F | Wt 254.4 lb

## 2015-09-28 DIAGNOSIS — R519 Headache, unspecified: Secondary | ICD-10-CM

## 2015-09-28 DIAGNOSIS — F329 Major depressive disorder, single episode, unspecified: Secondary | ICD-10-CM

## 2015-09-28 DIAGNOSIS — R51 Headache: Secondary | ICD-10-CM | POA: Diagnosis not present

## 2015-09-28 DIAGNOSIS — F32A Depression, unspecified: Secondary | ICD-10-CM

## 2015-09-28 MED ORDER — BUPROPION HCL ER (XL) 150 MG PO TB24: 150.0000 mg | ORAL_TABLET | Freq: Every day | ORAL | 3 refills | Status: DC

## 2015-09-28 NOTE — Progress Notes (Signed)
Pre visit review using our clinic review tool, if applicable. No additional management support is needed unless otherwise documented below in the visit note. 

## 2015-09-28 NOTE — Patient Instructions (Signed)
No change in depression medicine  We will call you within a week about your referral for MRI brain. If you do not hear within 2 weeks, give [place] a call.

## 2015-09-28 NOTE — Progress Notes (Signed)
Subjective:  [name] [name] is a 60 y.o. year old very pleasant female patient who presents for/with See problem oriented charting ROS- chronic daily headache. No si/hi. No chest pain. see any ROS included in HPI as well.   Past Medical History-  Patient Active Problem List   Diagnosis Date Noted  . Alcoholic fatty liver Q000111Q    Priority: High  . Type II diabetes mellitus, well controlled ([place]) 06/23/2010    Priority: High  . Alcohol use disorder, severe, dependence ([place]) 06/23/2010    Priority: High  . Chronic depression 06/23/2010    Priority: High  . Hypothyroid 07/21/2010    Priority: Medium  . HTN (hypertension) 06/23/2010    Priority: Medium  . Hyperlipidemia 06/23/2010    Priority: Medium  . Former smoker 07/10/2014    Priority: Low  . Osteoarthritis 05/16/2014    Priority: Low  . Chronic post-traumatic stress disorder (PTSD) 05/02/2014    Priority: Low  . Sleep disorder 05/02/2014    Priority: Low  . Family history of alcoholism 05/02/2014    Priority: Low  . Macrocytosis 04/19/2014    Priority: Low  . Fatigue 08/15/2013    Priority: Low  . Thyroid nodule 09/17/2012    Priority: Low  . Obesity (BMI 30-39.9) 06/23/2010    Priority: Low  . History of colonic polyps 04/12/2010    Priority: Low  . Obese 05/07/2015  . S/P left TKA 05/05/2015  . S/P knee replacement 05/05/2015  . Exertional shortness of breath 06/07/2014  . Chest pain 06/07/2014    Medications- reviewed and updated Current Outpatient Prescriptions  Medication Sig Dispense Refill  . ACCU-CHEK FASTCLIX LANCETS MISC Use to test blood sugars daily. Dx: E11.9 100 each 11  . B Complex Vitamins (B COMPLEX 100 PO) Take 1 tablet by mouth daily.     . benazepril-hydrochlorthiazide (LOTENSIN HCT) 20-12.5 MG tablet Take 1 tablet by mouth 2 (two) times daily. 180 tablet 1  . buPROPion (WELLBUTRIN XL) 150 MG 24 hr tablet Take 1 tablet (150 mg total) by mouth daily. 90 tablet 3  . escitalopram  (LEXAPRO) 20 MG tablet TAKE 1 TABLET BY MOUTH EVERY DAY 90 tablet 1  . ferrous sulfate 325 (65 FE) MG tablet Take 1 tablet (325 mg total) by mouth 3 (three) times daily after meals.  3  . glimepiride (AMARYL) 2 MG tablet Take 1 tablet (2 mg total) by mouth daily with breakfast. 90 tablet 3  . glucose blood (ACCU-CHEK COMPACT STRIPS) test strip Use to test blood sugars daily. Dx: E11.9 100 each 12  . JANUMET XR 50-1000 MG TB24 TAKE 2 TABLETS BY MOUTH DAILY. 180 tablet 3  . levothyroxine (SYNTHROID, LEVOTHROID) 88 MCG tablet Take 1 tablet (88 mcg total) by mouth daily. 90 tablet 3  . methocarbamol (ROBAXIN) 500 MG tablet Take 1 tablet (500 mg total) by mouth every 6 (six) hours as needed for muscle spasms. 50 tablet 0  . thiamine (VITAMIN B-1) 50 MG tablet TAKE 1 TABLET BY MOUTH EVERY MORNING 100 tablet 1  . traZODone (DESYREL) 50 MG tablet TAKE 1 TABLET AT BEDTIME AS NEEDED,MAY REPEAT 1 TIME IF NEEDED FOR SLEEP 90 tablet 3  . zinc gluconate 50 MG tablet Take 50 mg by mouth daily.     No current facility-administered medications for this visit.     Objective: BP 138/68 (BP Location: Left Arm, Patient Position: Sitting, Cuff Size: Normal)   Pulse 74   Temp 98.2 F (36.8 C) (Oral)  Wt 254 lb 6.4 oz (115.4 kg)   SpO2 95%   BMI 39.84 kg/m  Gen: NAD, resting comfortably CV: RRR no murmurs rubs or gallops Lungs: CTAB no crackles, wheeze, rhonchi Abdomen: soft/nontender/nondistended/normal bowel sounds. No rebound or guarding.  Ext: no edema Skin: warm, dry Neuro: CN II-XII intact, sensation and reflexes normal throughout, 5/5 muscle strength in bilateral upper and lower extremities. Normal finger to nose. Normal rapid alternating movements. No pronator drift. Normal romberg. Normal gait.   Assessment/Plan:  HA, chronic S: Several months. Constant headache. Ibuprofen 800mg  does not relieve (had been taking for knee). Sometimes gets sharp pains frontal area. Usually across forehead sometimes  in back of head as well. Not getting better or worse.  Has not tried any other particular remedies. Chronic 2/10 but with sharp pains up to 6/10. History of headaches as teenager but none since then. History of tension headaches- did biofeedback.  ROS- No facial or extremity weakness. No slurred words or trouble swallowing. no blurry vision or double vision. No paresthesias. No confusion or word finding difficulties.  A/P: new headache in patient over age 36. May be tension headache but given age must evaluate further. Reassuring neuro exam- will get Mr brain with and without contrast  Chronic depression S: now controlled on Lexapro 20mg , trazodone for sleep, wellbutrin 300mg -->150mg  for irritability at last visit. PHQ9 of 3 and GAD 7 of 0 (down from 11, 17 last visit). Patient realized stress of about to lose brother in law increased anxiety but after seeing him in [facility] she feels better.  A/P: continue current medication including lower dose wellbutrin 150mg  XL, extended counseling around stressors completed today.    3-4 month verbal  Orders Placed This Encounter  Procedures  . MR Brain W Wo Contrast    Standing Status:   Future    Standing Expiration Date:   11/27/2016    Order Specific Question:   If indicated for the ordered procedure, I authorize the administration of contrast media per Radiology protocol    Answer:   Yes    Order Specific Question:   Reason for Exam (SYMPTOM  OR DIAGNOSIS REQUIRED)    Answer:   new headache over age 28.    Order Specific Question:   Preferred imaging location?    Answer:   [facility] (table limit-550lbs)    Order Specific Question:   What is the patient's sedation requirement?    Answer:   No Sedation    Order Specific Question:   Does the patient have a pacemaker or implanted devices?    Answer:   Yes    Comments:   knee replacement    Meds ordered this encounter  Medications  . buPROPion (WELLBUTRIN XL) 150 MG 24 hr tablet    Sig: Take 1  tablet (150 mg total) by mouth daily.    Dispense:  90 tablet    Refill:  3   The duration of face-to-face time during this visit was 25 minutes. Greater than 50% of this time was spent in counseling, explanation of diagnosis, planning of further management, and/or coordination of care.    Return precautions advised.  [name], MD

## 2015-09-28 NOTE — Telephone Encounter (Signed)
Prior Authorization denial received for escitalopram (LEXAPRO) 20 MG tablet  Comments: Records show that this is not a member that we process claims for. Please check with patient and refer to the back of the patient's card for updated plan information.   Received from [place]

## 2015-09-28 NOTE — Assessment & Plan Note (Signed)
S: now controlled on Lexapro 20mg , trazodone for sleep, wellbutrin 300mg -->150mg  for irritability at last visit. PHQ9 of 3 and GAD 7 of 0 (down from 11, 17 last visit). Patient realized stress of about to lose brother in law increased anxiety but after seeing him in [facility] she feels better.  A/P: continue current medication including lower dose wellbutrin 150mg  XL, extended counseling around stressors completed today.

## 2015-09-29 ENCOUNTER — Telehealth: Payer: Self-pay | Admitting: Family Medicine

## 2015-09-29 NOTE — Telephone Encounter (Signed)
Pt states [place] [facility] is not on her insurance. Needs to go to [place]  Dr hunter had ordered an MRI of the brain.

## 2015-10-07 NOTE — Telephone Encounter (Signed)
Pt calling to check the status of her referral to [facility] for the the MRI

## 2015-10-09 ENCOUNTER — Other Ambulatory Visit: Payer: Self-pay

## 2015-10-12 ENCOUNTER — Ambulatory Visit (HOSPITAL_COMMUNITY)
Admission: RE | Admit: 2015-10-12 | Discharge: 2015-10-12 | Disposition: A | Discharge status: Home / Self Care | Payer: No Typology Code available for payment source | Source: Ambulatory Visit | Attending: Family Medicine | Admitting: Family Medicine

## 2015-10-12 ENCOUNTER — Encounter (HOSPITAL_COMMUNITY): Payer: Self-pay | Admitting: Radiology

## 2015-10-12 DIAGNOSIS — G43909 Migraine, unspecified, not intractable, without status migrainosus: Secondary | ICD-10-CM | POA: Insufficient documentation

## 2015-10-12 DIAGNOSIS — Z8673 Personal history of transient ischemic attack (TIA), and cerebral infarction without residual deficits: Secondary | ICD-10-CM | POA: Insufficient documentation

## 2015-10-12 DIAGNOSIS — R519 Headache, unspecified: Secondary | ICD-10-CM

## 2015-10-12 DIAGNOSIS — I6782 Cerebral ischemia: Secondary | ICD-10-CM | POA: Insufficient documentation

## 2015-10-12 DIAGNOSIS — R51 Headache: Secondary | ICD-10-CM | POA: Diagnosis present

## 2015-10-12 MED ORDER — GADOBENATE DIMEGLUMINE 529 MG/ML IV SOLN
20.0000 mL | Freq: Once | INTRAVENOUS | Status: AC | PRN
Start: 2015-10-12 — End: 2015-10-12
  Administered 2015-10-12: 20 mL via INTRAVENOUS

## 2015-10-14 ENCOUNTER — Other Ambulatory Visit: Payer: Self-pay | Admitting: Family Medicine

## 2015-10-15 ENCOUNTER — Other Ambulatory Visit: Payer: Self-pay

## 2015-10-15 ENCOUNTER — Encounter: Payer: Self-pay | Admitting: Family Medicine

## 2015-10-15 DIAGNOSIS — R519 Headache, unspecified: Secondary | ICD-10-CM

## 2015-10-15 DIAGNOSIS — R51 Headache: Principal | ICD-10-CM

## 2015-11-12 ENCOUNTER — Ambulatory Visit: Payer: Self-pay

## 2015-11-18 LAB — HM DIABETES EYE EXAM

## 2015-12-01 ENCOUNTER — Encounter: Payer: Self-pay | Admitting: Family Medicine

## 2015-12-08 ENCOUNTER — Encounter: Payer: Self-pay | Admitting: Family Medicine

## 2015-12-08 ENCOUNTER — Ambulatory Visit (INDEPENDENT_AMBULATORY_CARE_PROVIDER_SITE_OTHER): Payer: No Typology Code available for payment source | Admitting: Family Medicine

## 2015-12-08 VITALS — BP 118/68 | HR 73 | Temp 99.1°F | Wt 253.4 lb

## 2015-12-08 DIAGNOSIS — E119 Type 2 diabetes mellitus without complications: Secondary | ICD-10-CM

## 2015-12-08 DIAGNOSIS — J329 Chronic sinusitis, unspecified: Secondary | ICD-10-CM

## 2015-12-08 DIAGNOSIS — B9689 Other specified bacterial agents as the cause of diseases classified elsewhere: Secondary | ICD-10-CM

## 2015-12-08 DIAGNOSIS — R319 Hematuria, unspecified: Secondary | ICD-10-CM | POA: Diagnosis not present

## 2015-12-08 MED ORDER — AMOXICILLIN-POT CLAVULANATE 875-125 MG PO TABS: 1.0000 | ORAL_TABLET | Freq: Two times a day (BID) | ORAL | 0 refills | Status: DC

## 2015-12-08 NOTE — Progress Notes (Signed)
Pre visit review using our clinic review tool, if applicable. No additional management support is needed unless otherwise documented below in the visit note. 

## 2015-12-08 NOTE — Progress Notes (Signed)
PCP: [name], MD  Subjective:  [name] [name] is a 60 y.o. year old very pleasant female patient who presents with sinusitis symptoms including nasal congestion, sinus tenderness, cough, mild sore throat. Blowing out green discharge.  -day of illness:9 -Symptoms are worsening -previous treatments: OTC cold and flu medication -sick contacts/travel/risks: denies flu exposure.  -Hx of: allergies  ROS-denies fever (mild subjective warmth though), SOB, NVD, tooth pain  Pertinent Past Medical History-  Patient Active Problem List   Diagnosis Date Noted  . Alcoholic fatty liver Q000111Q    Priority: High  . Type II diabetes mellitus, well controlled ([place]) 06/23/2010    Priority: High  . Alcohol use disorder, severe, dependence ([place]) 06/23/2010    Priority: High  . Chronic depression 06/23/2010    Priority: High  . Hypothyroid 07/21/2010    Priority: Medium  . HTN (hypertension) 06/23/2010    Priority: Medium  . Hyperlipidemia 06/23/2010    Priority: Medium  . Former smoker 07/10/2014    Priority: Low  . Osteoarthritis 05/16/2014    Priority: Low  . Chronic post-traumatic stress disorder (PTSD) 05/02/2014    Priority: Low  . Sleep disorder 05/02/2014    Priority: Low  . Family history of alcoholism 05/02/2014    Priority: Low  . Macrocytosis 04/19/2014    Priority: Low  . Fatigue 08/15/2013    Priority: Low  . Thyroid nodule 09/17/2012    Priority: Low  . Obesity (BMI 30-39.9) 06/23/2010    Priority: Low  . History of colonic polyps 04/12/2010    Priority: Low  . Obese 05/07/2015  . S/P left TKA 05/05/2015  . S/P knee replacement 05/05/2015  . Exertional shortness of breath 06/07/2014  . Chest pain 06/07/2014    Medications- reviewed  Current Outpatient Prescriptions  Medication Sig Dispense Refill  . ACCU-CHEK FASTCLIX LANCETS MISC Use to test blood sugars daily. Dx: E11.9 100 each 11  . B Complex Vitamins (B COMPLEX 100 PO) Take 1 tablet by mouth daily.      . benazepril-hydrochlorthiazide (LOTENSIN HCT) 20-12.5 MG tablet Take 1 tablet by mouth 2 (two) times daily. 180 tablet 1  . buPROPion (WELLBUTRIN XL) 150 MG 24 hr tablet Take 1 tablet (150 mg total) by mouth daily. 90 tablet 3  . escitalopram (LEXAPRO) 20 MG tablet TAKE 1 TABLET BY MOUTH EVERY DAY 90 tablet 1  . ferrous sulfate 325 (65 FE) MG tablet Take 1 tablet (325 mg total) by mouth 3 (three) times daily after meals.  3  . glimepiride (AMARYL) 2 MG tablet Take 1 tablet (2 mg total) by mouth daily with breakfast. 90 tablet 3  . glucose blood (ACCU-CHEK COMPACT STRIPS) test strip Use to test blood sugars daily. Dx: E11.9 100 each 12  . JANUMET XR 50-1000 MG TB24 TAKE 2 TABLETS BY MOUTH DAILY. 180 tablet 3  . levothyroxine (SYNTHROID, LEVOTHROID) 88 MCG tablet Take 1 tablet (88 mcg total) by mouth daily. 90 tablet 3  . methocarbamol (ROBAXIN) 500 MG tablet Take 1 tablet (500 mg total) by mouth every 6 (six) hours as needed for muscle spasms. 50 tablet 0  . thiamine (VITAMIN B-1) 50 MG tablet TAKE 1 TABLET BY MOUTH EVERY MORNING 100 tablet 1  . traZODone (DESYREL) 50 MG tablet TAKE 1 TABLET AT BEDTIME AS NEEDED,MAY REPEAT 1 TIME IF NEEDED FOR SLEEP 90 tablet 3  . zinc gluconate 50 MG tablet Take 50 mg by mouth daily.     No current facility-administered medications for this  visit.     Objective: BP 118/68 (BP Location: Left Arm, Patient Position: Sitting, Cuff Size: Large)   Pulse 73   Temp 99.1 F (37.3 C) (Oral)   Wt 253 lb 6.4 oz (114.9 kg)   SpO2 94%   BMI 39.69 kg/m  Gen: NAD, resting comfortably HEENT: Turbinates erythematous with green drainage, TM normal, pharynx mildly erythematous with no tonsilar exudate or edema, maxillary sinus tenderness CV: RRR no murmurs rubs or gallops Lungs: CTAB no crackles, wheeze, rhonchi Abdomen: obese Skin: warm, dry, no rash  Assessment/Plan:  Sinsusitis Suspect Bacterial based on: Symptoms for 9 days and worsening still. Give this  another day or two and if not improving or if worsens- go [facility]   Treatment: -considered steroid: we opted out due to diabetes -other symptomatic care with mucinex is reasonable -Antibiotic indicated: yes potentially- will wait another day or two  Finally, we reviewed reasons to return to care including if symptoms worsen or persist  (despite antibiotic )or new concerns arise.  Hematuria, unspecified type - Plan: Urine Microscopic Only S:Also noted some blood with wiping after urinating today. May have wiped roughly or scratched herself. Never seen blood in urine before. And there was none in the toilet A/P: Will get urine microscopic test to make sure no blood in actual urine. If positive- likely refer to urology  Diabetes S: last a1c under 6.5 A/P: opt out of steroid option with potential to increase CBGs   Meds ordered this encounter  Medications  . amoxicillin-clavulanate (AUGMENTIN) 875-125 MG tablet    Sig: Take 1 tablet by mouth 2 (two) times daily.    Dispense:  14 tablet    Refill:  0   [name], MD

## 2015-12-08 NOTE — Patient Instructions (Signed)
Sinsusitis Suspect Bacterial based on: Symptoms for 9 days and worsening still. Give this another day or two and if not improving or if worsens- go pick up [facility]   Treatment: -considered steroid: we opted out due to diabetes -other symptomatic care with mucinex is reasonable -Antibiotic indicated: yes potentially- will wait another day or two  Finally, we reviewed reasons to return to care including if symptoms worsen or persist  (despite antibiotic )or new concerns arise.  Also noted some blood with wiping after urinating. Will get urine microscopic test to make sure no blood in actual urine.

## 2015-12-09 LAB — URINALYSIS, MICROSCOPIC ONLY

## 2015-12-10 ENCOUNTER — Other Ambulatory Visit (INDEPENDENT_AMBULATORY_CARE_PROVIDER_SITE_OTHER): Payer: No Typology Code available for payment source

## 2015-12-10 ENCOUNTER — Other Ambulatory Visit: Payer: Self-pay

## 2015-12-10 DIAGNOSIS — R319 Hematuria, unspecified: Secondary | ICD-10-CM

## 2015-12-10 LAB — URINALYSIS, MICROSCOPIC ONLY

## 2015-12-18 ENCOUNTER — Encounter: Payer: Self-pay | Admitting: Family Medicine

## 2015-12-21 ENCOUNTER — Other Ambulatory Visit (INDEPENDENT_AMBULATORY_CARE_PROVIDER_SITE_OTHER): Payer: No Typology Code available for payment source

## 2015-12-21 DIAGNOSIS — E119 Type 2 diabetes mellitus without complications: Secondary | ICD-10-CM | POA: Diagnosis not present

## 2015-12-21 DIAGNOSIS — I10 Essential (primary) hypertension: Secondary | ICD-10-CM

## 2015-12-21 NOTE — Telephone Encounter (Deleted)
Pt would like to know what labs she should have prior to her appt? Pt going out of town and wants to come today.  [id number]

## 2015-12-22 LAB — COMPREHENSIVE METABOLIC PANEL
ALT: 16 U/L (ref 0–35)
AST: 23 U/L (ref 0–37)
Albumin: 4 g/dL (ref 3.5–5.2)
Alkaline Phosphatase: 85 U/L (ref 39–117)
BUN: 15 mg/dL (ref 6–23)
CO2: 29 mEq/L (ref 19–32)
Calcium: 10 mg/dL (ref 8.4–10.5)
Chloride: 105 mEq/L (ref 96–112)
Creatinine, Ser: 0.89 mg/dL (ref 0.40–1.20)
GFR: 68.45 mL/min (ref >60.00)
Glucose, Bld: 52 mg/dL — ABNORMAL LOW (ref 70–99)
Potassium: 4.4 mEq/L (ref 3.5–5.1)
Sodium: 141 mEq/L (ref 135–145)
Total Bilirubin: 1 mg/dL (ref 0.2–1.2)
Total Protein: 6.7 g/dL (ref 6.0–8.3)

## 2015-12-22 LAB — HEMOGLOBIN A1C: Hgb A1c MFr Bld: 5.1 % (ref 4.6–6.5)

## 2015-12-29 ENCOUNTER — Other Ambulatory Visit: Payer: Self-pay | Admitting: Family Medicine

## 2015-12-29 ENCOUNTER — Encounter: Payer: Self-pay | Admitting: Family Medicine

## 2015-12-29 ENCOUNTER — Ambulatory Visit (INDEPENDENT_AMBULATORY_CARE_PROVIDER_SITE_OTHER): Payer: No Typology Code available for payment source | Admitting: Family Medicine

## 2015-12-29 VITALS — BP 132/72 | HR 69 | Temp 98.5°F | Wt 255.0 lb

## 2015-12-29 DIAGNOSIS — R300 Dysuria: Secondary | ICD-10-CM | POA: Diagnosis not present

## 2015-12-29 DIAGNOSIS — R0602 Shortness of breath: Secondary | ICD-10-CM

## 2015-12-29 DIAGNOSIS — Z1231 Encounter for screening mammogram for malignant neoplasm of breast: Secondary | ICD-10-CM

## 2015-12-29 DIAGNOSIS — I10 Essential (primary) hypertension: Secondary | ICD-10-CM

## 2015-12-29 DIAGNOSIS — F329 Major depressive disorder, single episode, unspecified: Secondary | ICD-10-CM

## 2015-12-29 DIAGNOSIS — F32A Depression, unspecified: Secondary | ICD-10-CM

## 2015-12-29 DIAGNOSIS — E119 Type 2 diabetes mellitus without complications: Secondary | ICD-10-CM

## 2015-12-29 LAB — POC URINALSYSI DIPSTICK (AUTOMATED)
Bilirubin, UA: NEGATIVE
Glucose, UA: NEGATIVE
Ketones, UA: NEGATIVE
Nitrite, UA: NEGATIVE
Spec Grav, UA: 1.025
Urobilinogen, UA: 1
pH, UA: 5.5

## 2015-12-29 NOTE — Assessment & Plan Note (Signed)
Stress test last year was reassuring per Dr. Evette Georges note. Still having some issues and worse in last few months. In cold air will sometimes wheeze. [facility] can be difficult. Advised graduated program starting 5 minutes a day and building 2 minutes every week.

## 2015-12-29 NOTE — Assessment & Plan Note (Signed)
S: controlled on benazepril 20mg  BP Readings from Last 3 Encounters:  12/29/15 132/72  12/08/15 118/68  09/28/15 138/68  A/P:Continue current medications, great control

## 2015-12-29 NOTE — Addendum Note (Signed)
Addended by: Elmer Picker on: 12/29/2015 01:02 PM   Modules accepted: Orders

## 2015-12-29 NOTE — Addendum Note (Signed)
Addended by: Gari Crown D on: 12/29/2015 11:27 AM   Modules accepted: Orders

## 2015-12-29 NOTE — Addendum Note (Signed)
Addended by: Elmer Picker on: 12/29/2015 01:08 PM   Modules accepted: Orders

## 2015-12-29 NOTE — Assessment & Plan Note (Signed)
S: overly controlled. On janumet 50-1000mg  XL 2 tablets daily. Was also on amaryl 1mg  but with most recent cbg on labs at 52 instructed to stop this CBGs-  112 this morning- had been in low 100s Exercise and diet- advised consider exercise bike Lab Results  Component Value Date   HGBA1C 5.1 12/21/2015   HGBA1C 6.1 08/26/2015   HGBA1C 5.4 04/24/2015   A/P: remain off amaryl- continue janumet. Go down to 1 pill a day if any more hypoglycemia

## 2015-12-29 NOTE — Patient Instructions (Addendum)
Mammogram in [place] - need records from this when you do it  Stop amaryl. Follow up 14 weeks- can repeat a1c by fingerprick at that time. Ok holding off on further labs until 6 months since you have done so well  Or we could just do physical in mid to late march if you wanted  Advised graduated program starting 5 minutes a day and building 2 minutes every week. Biking or walking fine.   Urine still processing. Will mychart you about decision.

## 2015-12-29 NOTE — Progress Notes (Signed)
Subjective:  Aala Mallik is a 60 y.o. year old very pleasant female patient who presents for/with See problem oriented charting ROS- dysuria and polyuria. No chest pain or nausea. Exertional SOB persists- slightly worse in last few months.see any ROS included in HPI as well.   Past Medical History-  Patient Active Problem List   Diagnosis Date Noted  . Alcoholic fatty liver Q000111Q    Priority: High  . Type II diabetes mellitus, well controlled ([place]) 06/23/2010    Priority: High  . Alcohol use disorder, severe, dependence ([place]) 06/23/2010    Priority: High  . Chronic depression 06/23/2010    Priority: High  . Hypothyroid 07/21/2010    Priority: Medium  . HTN (hypertension) 06/23/2010    Priority: Medium  . Hyperlipidemia 06/23/2010    Priority: Medium  . Former smoker 07/10/2014    Priority: Low  . Osteoarthritis 05/16/2014    Priority: Low  . Chronic post-traumatic stress disorder (PTSD) 05/02/2014    Priority: Low  . Sleep disorder 05/02/2014    Priority: Low  . Family history of alcoholism 05/02/2014    Priority: Low  . Macrocytosis 04/19/2014    Priority: Low  . Fatigue 08/15/2013    Priority: Low  . Thyroid nodule 09/17/2012    Priority: Low  . Obesity (BMI 30-39.9) 06/23/2010    Priority: Low  . History of colonic polyps 04/12/2010    Priority: Low  . Obese 05/07/2015  . S/P left TKA 05/05/2015  . S/P knee replacement 05/05/2015  . Exertional shortness of breath 06/07/2014  . Chest pain 06/07/2014    Medications- reviewed and updated Current Outpatient Prescriptions  Medication Sig Dispense Refill  . ACCU-CHEK FASTCLIX LANCETS MISC Use to test blood sugars daily. Dx: E11.9 100 each 11  . B Complex Vitamins (B COMPLEX 100 PO) Take 1 tablet by mouth daily.     . benazepril-hydrochlorthiazide (LOTENSIN HCT) 20-12.5 MG tablet Take 1 tablet by mouth 2 (two) times daily. 180 tablet 1  . buPROPion (WELLBUTRIN XL) 150 MG 24 hr tablet Take 1 tablet (150 mg  total) by mouth daily. 90 tablet 3  . escitalopram (LEXAPRO) 20 MG tablet TAKE 1 TABLET BY MOUTH EVERY DAY 90 tablet 1  . ferrous sulfate 325 (65 FE) MG tablet Take 1 tablet (325 mg total) by mouth 3 (three) times daily after meals.  3  . glucose blood (ACCU-CHEK COMPACT STRIPS) test strip Use to test blood sugars daily. Dx: E11.9 100 each 12  . JANUMET XR 50-1000 MG TB24 TAKE 2 TABLETS BY MOUTH DAILY. 180 tablet 3  . levothyroxine (SYNTHROID, LEVOTHROID) 88 MCG tablet Take 1 tablet (88 mcg total) by mouth daily. 90 tablet 3  . thiamine (VITAMIN B-1) 50 MG tablet TAKE 1 TABLET BY MOUTH EVERY MORNING 100 tablet 1  . traZODone (DESYREL) 50 MG tablet TAKE 1 TABLET AT BEDTIME AS NEEDED,MAY REPEAT 1 TIME IF NEEDED FOR SLEEP 90 tablet 3  . zinc gluconate 50 MG tablet Take 50 mg by mouth daily.     No current facility-administered medications for this visit.     Objective: BP 132/72   Pulse 69   Temp 98.5 F (36.9 C) (Oral)   Wt 255 lb (115.7 kg)   SpO2 98%   BMI 39.94 kg/m  Gen: NAD, resting comfortably CV: RRR no murmurs rubs or gallops Lungs: CTAB no crackles, wheeze, rhonchi Abdomen: soft/nontender/nondistended/normal bowel sounds. No rebound or guarding. obese Ext: no edema Skin: warm, dry, no rash  Assessment/Plan:  Type II diabetes mellitus, well controlled ([place]) S: overly controlled. On janumet 50-1000mg  XL 2 tablets daily. Was also on amaryl 1mg  but with most recent cbg on labs at 52 instructed to stop this CBGs-  112 this morning- had been in low 100s Exercise and diet- advised consider exercise bike Lab Results  Component Value Date   HGBA1C 5.1 12/21/2015   HGBA1C 6.1 08/26/2015   HGBA1C 5.4 04/24/2015   A/P: remain off amaryl- continue janumet. Go down to 1 pill a day if any more hypoglycemia  Chronic depression S: Depression controlled with no anhedonia or depressed mood on lexapro 20mg , wellbutrin 150mg , trazodone for sleep- some vivid dreams, some trouble getting  to sleep.  A/P: continue current regimen- perhaps trial a few days off trazodone then restart  HTN (hypertension) S: controlled on benazepril 20mg  BP Readings from Last 3 Encounters:  12/29/15 132/72  12/08/15 118/68  09/28/15 138/68  A/P:Continue current medications, great control  Exertional shortness of breath Stress test last year was reassuring per Dr. Evette Georges note. Still having some issues and worse in last few months. In cold air will sometimes wheeze. [facility] can be difficult. Advised graduated program starting 5 minutes a day and building 2 minutes every week.   Dysuria S:  Burning with urination and frequency. Symptoms going on for 10 days or so.  ROS- no fever, chills. CVA tenderness A/P: get UA, potential culture  Consider statin restart within next 6 months  UA needs to be entered still  Return precautions advised.  [name], MD

## 2015-12-29 NOTE — Assessment & Plan Note (Signed)
S: Depression controlled with no anhedonia or depressed mood on lexapro 20mg , wellbutrin 150mg , trazodone for sleep- some vivid dreams, some trouble getting to sleep.  A/P: continue current regimen- perhaps trial a few days off trazodone then restart

## 2015-12-29 NOTE — Progress Notes (Signed)
Pre visit review using our clinic review tool, if applicable. No additional management support is needed unless otherwise documented below in the visit note. 

## 2015-12-30 ENCOUNTER — Ambulatory Visit: Payer: Self-pay

## 2015-12-30 ENCOUNTER — Ambulatory Visit (INDEPENDENT_AMBULATORY_CARE_PROVIDER_SITE_OTHER): Payer: No Typology Code available for payment source

## 2015-12-30 DIAGNOSIS — Z1231 Encounter for screening mammogram for malignant neoplasm of breast: Secondary | ICD-10-CM

## 2015-12-31 ENCOUNTER — Other Ambulatory Visit: Payer: Self-pay | Admitting: Family Medicine

## 2015-12-31 LAB — URINE CULTURE: Colony Count: >=100000

## 2015-12-31 MED ORDER — NITROFURANTOIN MONOHYD MACRO 100 MG PO CAPS: 100.0000 mg | ORAL_CAPSULE | Freq: Two times a day (BID) | ORAL | 0 refills | Status: DC

## 2015-12-31 NOTE — Progress Notes (Signed)
a 

## 2016-01-01 ENCOUNTER — Encounter: Payer: Self-pay | Admitting: Family Medicine

## 2016-01-04 ENCOUNTER — Other Ambulatory Visit: Payer: Self-pay | Admitting: Family Medicine

## 2016-01-05 ENCOUNTER — Other Ambulatory Visit (HOSPITAL_COMMUNITY): Payer: Self-pay | Admitting: Neurology

## 2016-01-05 DIAGNOSIS — I6529 Occlusion and stenosis of unspecified carotid artery: Secondary | ICD-10-CM

## 2016-01-07 ENCOUNTER — Ambulatory Visit (INDEPENDENT_AMBULATORY_CARE_PROVIDER_SITE_OTHER): Payer: No Typology Code available for payment source | Admitting: Family Medicine

## 2016-01-07 ENCOUNTER — Encounter: Payer: Self-pay | Admitting: Family Medicine

## 2016-01-07 ENCOUNTER — Ambulatory Visit (HOSPITAL_COMMUNITY)
Admission: RE | Admit: 2016-01-07 | Discharge: 2016-01-07 | Disposition: A | Discharge status: Home / Self Care | Payer: No Typology Code available for payment source | Source: Ambulatory Visit | Attending: Neurology | Admitting: Neurology

## 2016-01-07 VITALS — BP 134/62 | HR 72 | Temp 98.9°F | Ht 67.0 in | Wt 260.6 lb

## 2016-01-07 DIAGNOSIS — N390 Urinary tract infection, site not specified: Secondary | ICD-10-CM | POA: Diagnosis not present

## 2016-01-07 DIAGNOSIS — B961 Klebsiella pneumoniae [K. pneumoniae] as the cause of diseases classified elsewhere: Secondary | ICD-10-CM

## 2016-01-07 DIAGNOSIS — I998 Other disorder of circulatory system: Secondary | ICD-10-CM | POA: Insufficient documentation

## 2016-01-07 DIAGNOSIS — B9689 Other specified bacterial agents as the cause of diseases classified elsewhere: Secondary | ICD-10-CM

## 2016-01-07 DIAGNOSIS — I6523 Occlusion and stenosis of bilateral carotid arteries: Secondary | ICD-10-CM | POA: Insufficient documentation

## 2016-01-07 DIAGNOSIS — M545 Low back pain, unspecified: Secondary | ICD-10-CM

## 2016-01-07 DIAGNOSIS — R21 Rash and other nonspecific skin eruption: Secondary | ICD-10-CM | POA: Diagnosis not present

## 2016-01-07 DIAGNOSIS — I6529 Occlusion and stenosis of unspecified carotid artery: Secondary | ICD-10-CM | POA: Diagnosis not present

## 2016-01-07 NOTE — Progress Notes (Signed)
Subjective:  [name] [name] is a 60 y.o. year old very pleasant female patient who presents for/with See problem oriented charting ROS- no fever, chills, nausea, vomiting. No abdominal pain. No fecal or urinary incontinence. see any ROS included in HPI as well.   Past Medical History-  Patient Active Problem List   Diagnosis Date Noted  . Alcoholic fatty liver Q000111Q    Priority: High  . Type II diabetes mellitus, well controlled ([place]) 06/23/2010    Priority: High  . Alcohol use disorder, severe, dependence ([place]) 06/23/2010    Priority: High  . Chronic depression 06/23/2010    Priority: High  . Hypothyroid 07/21/2010    Priority: Medium  . HTN (hypertension) 06/23/2010    Priority: Medium  . Hyperlipidemia 06/23/2010    Priority: Medium  . Former smoker 07/10/2014    Priority: Low  . Osteoarthritis 05/16/2014    Priority: Low  . Chronic post-traumatic stress disorder (PTSD) 05/02/2014    Priority: Low  . Sleep disorder 05/02/2014    Priority: Low  . Family history of alcoholism 05/02/2014    Priority: Low  . Macrocytosis 04/19/2014    Priority: Low  . Fatigue 08/15/2013    Priority: Low  . Thyroid nodule 09/17/2012    Priority: Low  . Obesity (BMI 30-39.9) 06/23/2010    Priority: Low  . History of colonic polyps 04/12/2010    Priority: Low  . Obese 05/07/2015  . S/P left TKA 05/05/2015  . S/P knee replacement 05/05/2015  . Exertional shortness of breath 06/07/2014  . Chest pain 06/07/2014    Medications- reviewed and updated Current Outpatient Prescriptions  Medication Sig Dispense Refill  . ACCU-CHEK FASTCLIX LANCETS MISC Use to test blood sugars daily. Dx: E11.9 100 each 11  . B Complex Vitamins (B COMPLEX 100 PO) Take 1 tablet by mouth daily.     . benazepril (LOTENSIN) 20 MG tablet TAKE 1 TABLET (20 MG TOTAL) BY MOUTH DAILY. 90 tablet 3  . benazepril-hydrochlorthiazide (LOTENSIN HCT) 20-12.5 MG tablet Take 1 tablet by mouth 2 (two) times daily. 180 tablet  1  . buPROPion (WELLBUTRIN XL) 150 MG 24 hr tablet Take 1 tablet (150 mg total) by mouth daily. 90 tablet 3  . escitalopram (LEXAPRO) 20 MG tablet TAKE 1 TABLET BY MOUTH EVERY DAY 90 tablet 1  . ferrous sulfate 325 (65 FE) MG tablet Take 1 tablet (325 mg total) by mouth 3 (three) times daily after meals.  3  . flurbiprofen (ANSAID) 100 MG tablet Take 100 mg by mouth 2 (two) times daily as needed.    . gabapentin (NEURONTIN) 300 MG capsule Take 300 mg by mouth at bedtime.    Marland Kitchen glucose blood (ACCU-CHEK COMPACT STRIPS) test strip Use to test blood sugars daily. Dx: E11.9 100 each 12  . JANUMET XR 50-1000 MG TB24 TAKE 2 TABLETS BY MOUTH DAILY. 180 tablet 3  . L-Theanine 100 MG CAPS Take 1 tablet by mouth.    . levothyroxine (SYNTHROID, LEVOTHROID) 88 MCG tablet Take 1 tablet (88 mcg total) by mouth daily. 90 tablet 3  . thiamine (VITAMIN B-1) 50 MG tablet TAKE 1 TABLET BY MOUTH EVERY MORNING 100 tablet 1  . traZODone (DESYREL) 50 MG tablet TAKE 1 TABLET AT BEDTIME AS NEEDED,MAY REPEAT 1 TIME IF NEEDED FOR SLEEP 90 tablet 3  . zinc gluconate 50 MG tablet Take 50 mg by mouth daily.     No current facility-administered medications for this visit.     Objective: BP  134/62 (BP Location: Left Arm, Patient Position: Sitting, Cuff Size: Large)   Pulse 72   Temp 98.9 F (37.2 C) (Oral)   Ht 5\' 7"  (1.702 m)   Wt 260 lb 9.6 oz (118.2 kg)   SpO2 97%   BMI 40.82 kg/m  Gen: NAD, resting comfortably CV: RRR no murmurs rubs or gallops Lungs: CTAB no crackles, wheeze, rhonchi Abdomen: soft/nontender/nondistended/normal bowel sounds.Obese Msk:  No pain with palpation with back exam. No CVA tenderness.  Assessment/Plan:  UTI  Rash  Low back pain  S: Patient started treatment for Klebsiella UTI several days ago. She has now had diffuse erythematousrash for 3 days which spares her face, hands, feet. Also has low back pain since starting the nitrofurantoin. She also notes mild temperature elevation into  the 99s when she normally stays in the 98s. She denies fall or injury in relation to the low back pain. It is rated as a mild to moderate ache bilaterally. She is still experiencing mild dysuria and urgency though they are improving. A/P: I discussed with patient I would advise cessation of the nitrofurantoin and repeat urine culture to determine alternate therapy if needed. Rash and low back pain may both be associated with antibiotic. Low back pain could Also be musculoskeletal.  Patient would like to finish course given only has one day left. We will still get urine culture and if she has persistent symptoms into next week and we will likely start an antibiotic.  Added the nitrofurantoin to allergy list  Orders Placed This Encounter  Procedures  . Urine culture    [facility]    Meds ordered this encounter  Medications  . flurbiprofen (ANSAID) 100 MG tablet    Sig: Take 100 mg by mouth 2 (two) times daily as needed.  Marland Kitchen L-Theanine 100 MG CAPS    Sig: Take 1 tablet by mouth.  . gabapentin (NEURONTIN) 300 MG capsule    Sig: Take 300 mg by mouth at bedtime.    Return precautions advised.  [name], MD

## 2016-01-07 NOTE — Progress Notes (Signed)
VASCULAR LAB PRELIMINARY  PRELIMINARY  PRELIMINARY  PRELIMINARY  Carotid duplex completed.    Preliminary report:  Bilateral:  1-39% ICA stenosis.  Vertebral artery flow is antegrade.     Emalea Mix, RVS 01/07/2016, 9:55 AM

## 2016-01-07 NOTE — Progress Notes (Signed)
Pre visit review using our clinic review tool, if applicable. No additional management support is needed unless otherwise documented below in the visit note. 

## 2016-01-07 NOTE — Patient Instructions (Signed)
Suspect symptoms may be related to antibiotic. We discussed option of stopping. You wanted to finish while awaiting urine culture.   Should have results back by Monday if we need new antibiotics since symptoms have not completely resolved

## 2016-01-08 LAB — VAS US CAROTID
LEFT ECA DIAS: -13 cm/s
LEFT VERTEBRAL DIAS: 8 cm/s
Left CCA dist dias: 15 cm/s
Left CCA dist sys: 101 cm/s
Left CCA prox dias: 13 cm/s
Left CCA prox sys: 157 cm/s
Left ICA dist dias: -24 cm/s
Left ICA dist sys: -94 cm/s
Left ICA prox dias: -18 cm/s
Left ICA prox sys: -82 cm/s
RIGHT ECA DIAS: -9 cm/s
RIGHT VERTEBRAL DIAS: 10 cm/s
Right CCA prox dias: 14 cm/s
Right CCA prox sys: 118 cm/s
Right cca dist sys: -86 cm/s

## 2016-01-08 LAB — URINE CULTURE: Organism ID, Bacteria: NO GROWTH

## 2016-01-19 ENCOUNTER — Other Ambulatory Visit: Payer: Self-pay | Admitting: Family Medicine

## 2016-01-29 ENCOUNTER — Encounter: Payer: Self-pay | Admitting: Family Medicine

## 2016-02-23 ENCOUNTER — Encounter: Payer: Self-pay | Admitting: Family Medicine

## 2016-02-23 ENCOUNTER — Ambulatory Visit (INDEPENDENT_AMBULATORY_CARE_PROVIDER_SITE_OTHER): Payer: No Typology Code available for payment source | Admitting: Family Medicine

## 2016-02-23 VITALS — BP 136/58 | HR 81 | Temp 98.2°F | Ht 67.0 in | Wt 256.4 lb

## 2016-02-23 DIAGNOSIS — R911 Solitary pulmonary nodule: Secondary | ICD-10-CM | POA: Diagnosis not present

## 2016-02-23 DIAGNOSIS — M79671 Pain in right foot: Secondary | ICD-10-CM | POA: Diagnosis not present

## 2016-02-23 DIAGNOSIS — E785 Hyperlipidemia, unspecified: Secondary | ICD-10-CM | POA: Diagnosis not present

## 2016-02-23 NOTE — Patient Instructions (Signed)
Removed 8 staples. Change dressing daily for next week then likely can stop dressings unless area irritated.  May wash foot but would not soak. Follow up if expanding redness or pus from the wound.   Lab Results  Component Value Date   CHOL 173 04/24/2015   HDL 64.10 04/24/2015   LDLCALC 95 04/24/2015   LDLDIRECT 158.6 02/26/2014   TRIG 65.0 04/24/2015   CHOLHDL 3 04/24/2015  For your neurologist- not on statin/cholesterol medicine. If they want [place] to push for under 70 LDL, we could certainly try at this point since liver doing much better  Please go to [facility] to follow up on possible nodule - located [address] across [address] from [place] - in the basement - Hours: 8:30-5:30 PM M-F. Do not need appointment.

## 2016-02-23 NOTE — Progress Notes (Signed)
Subjective:  [name] [name] is a 61 y.o. year old very pleasant female patient who presents for/with See problem oriented charting ROS- no fever, chills, expanding redness beyond dressing on right leg.    Past Medical History-  Patient Active Problem List   Diagnosis Date Noted  . Alcoholic fatty liver Q000111Q    Priority: High  . Type II diabetes mellitus, well controlled ([place]) 06/23/2010    Priority: High  . Alcohol use disorder, severe, dependence ([place]) 06/23/2010    Priority: High  . Chronic depression 06/23/2010    Priority: High  . Hypothyroid 07/21/2010    Priority: Medium  . HTN (hypertension) 06/23/2010    Priority: Medium  . Hyperlipidemia 06/23/2010    Priority: Medium  . Former smoker 07/10/2014    Priority: Low  . Osteoarthritis 05/16/2014    Priority: Low  . Chronic post-traumatic stress disorder (PTSD) 05/02/2014    Priority: Low  . Sleep disorder 05/02/2014    Priority: Low  . Family history of alcoholism 05/02/2014    Priority: Low  . Macrocytosis 04/19/2014    Priority: Low  . Fatigue 08/15/2013    Priority: Low  . Thyroid nodule 09/17/2012    Priority: Low  . Obesity (BMI 30-39.9) 06/23/2010    Priority: Low  . History of colonic polyps 04/12/2010    Priority: Low  . Obese 05/07/2015  . S/P left TKA 05/05/2015  . S/P knee replacement 05/05/2015  . Exertional shortness of breath 06/07/2014  . Chest pain 06/07/2014    Medications- reviewed and updated Current Outpatient Prescriptions  Medication Sig Dispense Refill  . ACCU-CHEK FASTCLIX LANCETS MISC Use to test blood sugars daily. Dx: E11.9 100 each 11  . ACCU-CHEK SMARTVIEW test strip USE TO TEST BLOOD SUGARS DAILY 100 each 4  . B Complex Vitamins (B COMPLEX 100 PO) Take 1 tablet by mouth daily.     . benazepril (LOTENSIN) 20 MG tablet TAKE 1 TABLET (20 MG TOTAL) BY MOUTH DAILY. 90 tablet 3  . buPROPion (WELLBUTRIN XL) 150 MG 24 hr tablet Take 1 tablet (150 mg total) by mouth daily. 90  tablet 3  . escitalopram (LEXAPRO) 20 MG tablet TAKE 1 TABLET BY MOUTH EVERY DAY 90 tablet 1  . ferrous sulfate 325 (65 FE) MG tablet Take 1 tablet (325 mg total) by mouth 3 (three) times daily after meals.  3  . flurbiprofen (ANSAID) 100 MG tablet Take 100 mg by mouth 2 (two) times daily as needed.    . gabapentin (NEURONTIN) 300 MG capsule Take 300 mg by mouth 2 (two) times daily.     Marland Kitchen JANUMET XR 50-1000 MG TB24 TAKE 2 TABLETS BY MOUTH DAILY. 180 tablet 3  . L-Theanine 100 MG CAPS Take 1 tablet by mouth.    . levothyroxine (SYNTHROID, LEVOTHROID) 88 MCG tablet Take 1 tablet (88 mcg total) by mouth daily. 90 tablet 3  . thiamine (VITAMIN B-1) 50 MG tablet TAKE 1 TABLET BY MOUTH EVERY MORNING 100 tablet 1  . traZODone (DESYREL) 50 MG tablet TAKE 1 TABLET AT BEDTIME AS NEEDED,MAY REPEAT 1 TIME IF NEEDED FOR SLEEP 90 tablet 3  . zinc gluconate 50 MG tablet Take 50 mg by mouth daily.     No current facility-administered medications for this visit.     Objective: BP (!) 136/58 (BP Location: Left Arm, Patient Position: Sitting, Cuff Size: Large)   Pulse 81   Temp 98.2 F (36.8 C) (Oral)   Ht 5\' 7"  (1.702 m)  Wt 256 lb 6.4 oz (116.3 kg)   SpO2 97%   BMI 40.16 kg/m  Gen: NAD, resting comfortably CV: RRR no murmurs rubs or gallops Lungs: CTAB no crackles, wheeze, rhonch.  Ext: no edema pretibially MSK: multiple layers of dressing removed from right foot- underneath notedthickened scale over healing area (scale falls off with removal of 8 staples). Luckily no maceration. Only mild surrounding erythema Assessment/Plan:  Hyperlipidemia S: reasonably controlled on no statin. No myalgias.  Lab Results  Component Value Date   CHOL 173 04/24/2015   HDL 64.10 04/24/2015   LDLCALC 95 04/24/2015   LDLDIRECT 158.6 02/26/2014   TRIG 65.0 04/24/2015   CHOLHDL 3 04/24/2015   A/P: apparently sees neurology and told LDL goal <100- could push for further given diabetic but will hold on repeat  until CPE and then discuss further- liver function has bene much improved off alcohol.   Pulmonary nodule  S: noted in preoperative evaluation left perihilar region. astymptomatic A/P: I cannot see original film- will get repeat film at this point and if positive await recommendations but consider CT chest with contrast in former smoker though quit in 1980s  Right Foot pain S: much improved after recent surgery- apparently she had "3 tears on right, tear mid foot, tear on left" and her " foot would give way". Though she never fell. Insurance only covered surgery in [place] and she had "bridges" and "supports" built into her foot. Records show mild idstal posterior tibial tenosynovitis as well as "partial tears of the anteiror inferior tibiofibular ligament, ATFL, CFL and deltoid ligament. A small fracture fragment seen adjacent to the distal tip of the medial malleolus" A/P: Interestingly patient reports no wound care instructions and she has had her boot on and dressing on since December 14th. This was somewhat difficult to remove but fortunately wound appeared to be healing well underneath despite this. No staple # given- but 8 staples removed today. She has follow up In a few weeks with podiatrist in [place] and hopeful we can get records at that time.   Advised follow up for next CPE likely mid march or later  Orders Placed This Encounter  Procedures  . DG Chest 2 View    Standing Status:   Future    Standing Expiration Date:   04/22/2017    Order Specific Question:   Reason for Exam (SYMPTOM  OR DIAGNOSIS REQUIRED)    Answer:   outside hospital preoperative report says pulmonary nodule in [place]- want to repeat and determine if CT scan warranted. quit smoking in 1980s    Order Specific Question:   Preferred imaging location?    Answer:   [facility]   Return precautions advised.  [name], MD

## 2016-02-23 NOTE — Assessment & Plan Note (Signed)
S: reasonably controlled on no statin. No myalgias.  Lab Results  Component Value Date   CHOL 173 04/24/2015   HDL 64.10 04/24/2015   LDLCALC 95 04/24/2015   LDLDIRECT 158.6 02/26/2014   TRIG 65.0 04/24/2015   CHOLHDL 3 04/24/2015   A/P: apparently sees neurology and told LDL goal <100- could push for further given diabetic but will hold on repeat until CPE and then discuss further- liver function has bene much improved off alcohol.

## 2016-02-23 NOTE — Progress Notes (Signed)
Pre visit review using our clinic review tool, if applicable. No additional management support is needed unless otherwise documented below in the visit note. 

## 2016-02-24 ENCOUNTER — Ambulatory Visit (INDEPENDENT_AMBULATORY_CARE_PROVIDER_SITE_OTHER)
Admission: RE | Admit: 2016-02-24 | Discharge: 2016-02-24 | Disposition: A | Discharge status: Home / Self Care | Payer: No Typology Code available for payment source | Source: Ambulatory Visit | Attending: Family Medicine | Admitting: Family Medicine

## 2016-02-24 DIAGNOSIS — R911 Solitary pulmonary nodule: Secondary | ICD-10-CM

## 2016-02-25 ENCOUNTER — Telehealth: Payer: Self-pay | Admitting: Family Medicine

## 2016-02-25 ENCOUNTER — Other Ambulatory Visit: Payer: Self-pay | Admitting: *Deleted

## 2016-02-25 DIAGNOSIS — R911 Solitary pulmonary nodule: Secondary | ICD-10-CM

## 2016-02-25 MED ORDER — GLUCOSE BLOOD VI STRP: ORAL_STRIP | 12 refills | Status: DC

## 2016-02-25 NOTE — Telephone Encounter (Signed)
Order CT chest for pulmonary nodule

## 2016-03-01 ENCOUNTER — Ambulatory Visit: Payer: No Typology Code available for payment source | Attending: Foot & Ankle Surgery

## 2016-03-01 ENCOUNTER — Other Ambulatory Visit: Payer: Self-pay

## 2016-03-01 DIAGNOSIS — M25671 Stiffness of right ankle, not elsewhere classified: Secondary | ICD-10-CM | POA: Diagnosis present

## 2016-03-01 DIAGNOSIS — R600 Localized edema: Secondary | ICD-10-CM | POA: Insufficient documentation

## 2016-03-01 DIAGNOSIS — M25571 Pain in right ankle and joints of right foot: Secondary | ICD-10-CM | POA: Insufficient documentation

## 2016-03-01 DIAGNOSIS — M6281 Muscle weakness (generalized): Secondary | ICD-10-CM | POA: Insufficient documentation

## 2016-03-01 DIAGNOSIS — R2689 Other abnormalities of gait and mobility: Secondary | ICD-10-CM | POA: Diagnosis present

## 2016-03-01 NOTE — Patient Instructions (Addendum)
Ankle Circles   Slowly rotate right foot and ankle clockwise then counterclockwise. Gradually increase range of motion. Avoid pain. Circle _10___ times each direction per set. Do _1___ sets per session. Do _4-5___ sessions per day.  http://orth.exer.us/30   Copyright  VHI. All rights reserved.  Long Sitting Bend   In long sitting position, bend and straighten toes. Then bend ankle, moving foot. Then bend and straighten knee. Do the moves to one leg and then the other. Repeat 10____ times. Do 4-5____ sessions per day.  http://gt2.exer.us/647     Ankle Sideward Movement (Inversion / Eversion)   Sitting or lying with feet on floor, keep knee still and rock foot onto outer edge. Return to resting position. Now rock foot onto inner edge. Repeat with other foot, then with both feet. Repeat _10___ times. Do __4-5__ sessions per day.   [facility] [address], [place], [place] [address] Phone # [phone] Fax [phone]

## 2016-03-01 NOTE — Therapy (Signed)
[facility] [address] [place], [place], [address] Phone: [phone]   Fax:  [phone]  Physical Therapy Evaluation  Patient Details  Name: [name] [name] MRN: [id number] Date of Birth: 1955/12/16 Referring Provider: Dorrene German, DPM  Encounter Date: 03/01/2016      PT End of Session - 03/01/16 1050    Visit Number 1   Date for PT Re-Evaluation 04/26/16   PT Start Time [id number]   PT Stop Time 1100   PT Time Calculation (min) 46 min   Activity Tolerance Patient tolerated treatment well   Behavior During Therapy [facility] for tasks assessed/performed      Past Medical History:  Diagnosis Date  . Acute meniscal tear of knee    Left knee  . Alcohol problem drinking    rehab  . Anemia    menstrual related  . Anxiety   . Arthritis    right ankle-neuropathy  . Colon polyps   . Depression   . Diabetes mellitus 12/2009   type 2  . Diabetes mellitus, type II ([place])   . Evalluate for OSA (obstructive sleep apnea) 08/15/2013   No OSA on sleep study but may be underestimated.-never used cpap    . GERD (gastroesophageal reflux disease)    not currently taking.  Marland Kitchen Heart murmur   . History of recurrent UTIs   . Hyperlipidemia   . Hypertension   . Hypothyroidism   . Liver disease   . Neuromuscular disorder ([place])    neropathy bilateral feet.  . 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 Shortness of breath    with exertion   . Thyroid disease     Past Surgical History:  Procedure Laterality Date  . ADENOIDECTOMY    . BREAST SURGERY Right    lumpectomy-benign  . CESAREAN SECTION  1987  . CHOLECYSTECTOMY    . MASS EXCISION Left 12/16/2013   Procedure: EXCISION LEFT  DISTAL THIGH MASS;  Surgeon: Mauri Pole, MD;  Location: [facility];  Service: Orthopedics;  Laterality: Left;  . right ankle surgery      x 2- no retained hardware  . right rotator cuff surgery     .  TONSILLECTOMY    . TOTAL KNEE ARTHROPLASTY  2009   right  . TOTAL KNEE ARTHROPLASTY Left 05/05/2015   Procedure: LEFT TOTAL KNEE ARTHROPLASTY;  Surgeon: Paralee Cancel, MD;  Location: [facility];  Service: Orthopedics;  Laterality: Left;  . TOTAL KNEE REVISION Right 12/16/2013   Procedure: RIGHT TOTAL KNEE REVISION POLY EXCHANGE;  Surgeon: Mauri Pole, MD;  Location: [facility];  Service: Orthopedics;  Laterality: Right;  . TUBAL LIGATION  1987    There were no vitals filed for this visit.       Subjective Assessment - 03/01/16 1018    Subjective Pt presents to PT s/p Rt ankle ligament repair surgery performed 02/04/16.  Pt had Rt total knee replacement in 2015 and pt reports progressive pain in the Rt ankle after that.   Pt reports that she had tears in multiple ligaments prior to surgery.  Pt is wearing short CAM boot walker for all ambulation.     Pertinent History Rt ankle ligament repair surgery 02/04/16.  MD will call today with post-op protocol/instructions   How long can you stand comfortably? pt has been limiting this after surgery   How long can you walk comfortably? wearing CAM boot.  Pt has  been limiting this.   Patient Stated Goals normalize gait, reduce pain, improve strength and mobility of the ankle   Currently in Pain? Yes   Pain Score 5    Pain Location Ankle   Pain Orientation Right   Pain Descriptors / Indicators Dull;Sharp   Pain Type Surgical pain   Pain Onset 1 to 4 weeks ago   Pain Frequency Intermittent   Aggravating Factors  standing/walking, touching the ankle   Pain Relieving Factors sitting, non weight bearing            [facility] PT Assessment - 03/01/16 0001      Assessment   Medical Diagnosis s/p Rt lateral ankle ligament repair   Referring Provider Dorrene German, DPM   Onset Date/Surgical Date 02/03/17   Next MD Visit 03/21/16   Prior Therapy none     Precautions   Precautions Other (comment)  Follow MD orders (will send today)      Restrictions   Weight Bearing Restrictions No     Balance Screen   Has the patient fallen in the past 6 months No   Has the patient had a decrease in activity level because of a fear of falling?  No   Is the patient reluctant to leave their home because of a fear of falling?  No     Home Environment   Living Environment Private residence   Living Arrangements Spouse/significant other   Type of [employer] Access Level entry   Home Layout One level   [place] - 2 wheels;Cane - single point;Crutches     Prior Function   Level of Independence Independent   Vocation Unemployed   Leisure crafts     Cognition   Overall Cognitive Status Within Functional Limits for tasks assessed     Observation/Other Assessments   Focus on Therapeutic Outcomes (FOTO)  59% limitation     Observation/Other Assessments-Edema    Edema Circumferential     Circumferential Edema   Circumferential - Right mid malleolus 28.5 cm   Circumferential - Left  midmalleous 25.5 cm     Posture/Postural Control   Posture/Postural Control No significant limitations     ROM / Strength   AROM / PROM / Strength AROM;PROM;Strength     AROM   Overall AROM  Deficits   Overall AROM Comments Lt ankle A/ROM   AROM Assessment Site Ankle   Right/Left Ankle Right   Right Ankle Dorsiflexion 6   Right Ankle Plantar Flexion 32   Right Ankle Inversion 21   Right Ankle Eversion 15     PROM   Overall PROM  Deficits   PROM Assessment Site Ankle   Right/Left Ankle Right   Right Ankle Dorsiflexion 8   Right Ankle Plantar Flexion 38   Right Ankle Inversion 23   Right Ankle Eversion 18     Strength   Overall Strength Due to precautions   Overall Strength Comments Rt and Lt knee 4+/5, Rt ankle not formally tested due to post-op     Palpation   Palpation comment incision covered by bandage as pt reports it was draining.     Ambulation/Gait   Ambulation/Gait Yes   Ambulation/Gait Assistance 6:  Modified independent (Device/Increase time)   Ambulation Distance (Feet) 100 Feet   Gait Pattern Step-through pattern;Decreased step length - right;Decreased stance time - right   Gait Comments wearing CAM boot  [place] Adult PT Treatment/Exercise - 03/01/16 0001      Modalities   Modalities Vasopneumatic     Vasopneumatic   Number Minutes Vasopneumatic  15 minutes   Vasopnuematic Location  Ankle   Vasopneumatic Pressure Medium   Vasopneumatic Temperature  3 snowflakes                PT Education - 03/01/16 1037    Education provided Yes   Education Details ankle A/ROM   Person(s) Educated Patient   Methods Explanation;Demonstration;Handout   Comprehension Verbalized understanding;Returned demonstration          PT Short Term Goals - 03/01/16 1057      PT SHORT TERM GOAL #1   Time 4   Period Weeks   Status New     PT SHORT TERM GOAL #2   Title demonstrate Rt ankle inversion and eversion P/ROM to full to improve mobility   Time 4   Period Weeks   Status New     PT SHORT TERM GOAL #3   Title report < or = to 3/10 Rt ankle pain with standing and walking   Time 4   Period Weeks   Status New     PT SHORT TERM GOAL #4   Title wean from CAM boot as allowed for home distances > or = to 75% of the time   Time 4   Period Weeks   Status New           PT Long Term Goals - 03/01/16 1012      PT LONG TERM GOAL #1   Title be independent in advanced HEP   Time 8   Period Weeks   Status New     PT LONG TERM GOAL #2   Title reduce FOTO to < or = to 48% limitation   Time 8   Period Weeks   Status New     PT LONG TERM GOAL #3   Title demonstrate Rt ankle A/ROM to full to improve gait and mobility   Time 8   Period Weeks   Status New     PT LONG TERM GOAL #4   Title wean from CAM boot for all distances and demonstrate symmetry on level surface   Time 8   Period Weeks   Status New     PT LONG TERM GOAL #5   Title  report < or = to 2/10 Rt ankle pain with standing and walking   Time 8   Period Weeks   Status New     Additional Long Term Goals   Additional Long Term Goals Yes     PT LONG TERM GOAL #6   Title stand and walk for 45 minutes to allow for improved endurance for home and community tasks   Time 8   Period Weeks   Status New               Plan - 03/01/16 1051    Clinical Impression Statement Pt presents to PT s/p Rt ankle ligament repair surgery performed 02/04/16.  No injury just progressive pain and instability over time.  PT has been wearing CAM boot since surgery and WBAT for 1 week.  Pt demonstrates limited Rt ankle A/ROM and P/ROM, gait abnormality, edema and limited endurance for standing s/p surgery.  Pt is a low complexity evaluation due to lack of co-morbidities and stable condition.  Pt will benefit from skilled PT for safe progression of Rt ankle A/ROM,  strength, propriocpeption and gait with edema and pain management as needed.     Rehab Potential Good   PT Frequency 3x / week   PT Duration 8 weeks   PT Treatment/Interventions ADLs/Self Care Home Management;Cryotherapy;Electrical Stimulation;Functional mobility training;Stair training;Gait training;Ultrasound;Moist Heat;Therapeutic activities;Therapeutic exercise;Neuromuscular re-education;Patient/family education;Passive range of motion;Scar mobilization;Manual techniques;Taping;Vasopneumatic Device   PT Next Visit Plan MD is to call with orders, Rt ankle A/ROM, edema management, scar mobilizations if it is closed/healed   Consulted and Agree with Plan of Care Patient      Patient will benefit from skilled therapeutic intervention in order to improve the following deficits and impairments:  Abnormal gait, Pain, Decreased strength, Increased edema, Decreased scar mobility, Impaired flexibility, Decreased activity tolerance, Decreased endurance, Decreased range of motion, Difficulty walking  Visit Diagnosis: Pain in  right ankle and joints of right foot  Other abnormalities of gait and mobility  Localized edema  Stiffness of right ankle, not elsewhere classified  Muscle weakness (generalized)     Problem List Patient Active Problem List   Diagnosis Date Noted  . Obese 05/07/2015  . S/P left TKA 05/05/2015  . S/P knee replacement 05/05/2015  . Former smoker 07/10/2014  . Exertional shortness of breath 06/07/2014  . Chest pain 06/07/2014  . Osteoarthritis 05/16/2014  . Alcoholic fatty liver [id number]  . Chronic post-traumatic stress disorder (PTSD) 05/02/2014  . Sleep disorder 05/02/2014  . Family history of alcoholism 05/02/2014  . Macrocytosis 04/19/2014  . Fatigue 08/15/2013  . Thyroid nodule 09/17/2012  . Hypothyroid 07/21/2010  . Type II diabetes mellitus, well controlled ([place]) 06/23/2010  . HTN (hypertension) 06/23/2010  . Alcohol use disorder, severe, dependence ([place]) 06/23/2010  . Hyperlipidemia 06/23/2010  . Obesity (BMI 30-39.9) 06/23/2010  . Chronic depression 06/23/2010  . History of colonic polyps 04/12/2010     [name] [name], PT 03/01/16 11:02 AM   [facility] [address], [place], [place], [address] Phone: [phone]   Fax:  [phone]  Name: [name] [name] MRN: [id number] Date of Birth: [birth year]

## 2016-03-02 ENCOUNTER — Ambulatory Visit: Payer: No Typology Code available for payment source | Admitting: Physical Therapy

## 2016-03-02 ENCOUNTER — Encounter: Payer: Self-pay | Admitting: Physical Therapy

## 2016-03-02 DIAGNOSIS — R2689 Other abnormalities of gait and mobility: Secondary | ICD-10-CM

## 2016-03-02 DIAGNOSIS — M25571 Pain in right ankle and joints of right foot: Secondary | ICD-10-CM

## 2016-03-02 DIAGNOSIS — R600 Localized edema: Secondary | ICD-10-CM

## 2016-03-02 NOTE — Therapy (Signed)
[facility] [address] [place], [place], [address] Phone: [phone]   Fax:  [phone]  Physical Therapy Treatment  Patient Details  Name: [name] [name] MRN: [id number] Date of Birth: Nov 19, 1955 Referring Provider: Dorrene German, DPM  Encounter Date: 03/02/2016      PT End of Session - 03/02/16 1531    Visit Number 2   Date for PT Re-Evaluation 04/26/16   PT Start Time [id number]   PT Stop Time 1624   PT Time Calculation (min) 54 min   Activity Tolerance Patient tolerated treatment well   Behavior During Therapy [facility] for tasks assessed/performed      Past Medical History:  Diagnosis Date  . Acute meniscal tear of knee    Left knee  . Alcohol problem drinking    rehab  . Anemia    menstrual related  . Anxiety   . Arthritis    right ankle-neuropathy  . Colon polyps   . Depression   . Diabetes mellitus 12/2009   type 2  . Diabetes mellitus, type II ([place])   . Evalluate for OSA (obstructive sleep apnea) 08/15/2013   No OSA on sleep study but may be underestimated.-never used cpap    . GERD (gastroesophageal reflux disease)    not currently taking.  Marland Kitchen Heart murmur   . History of recurrent UTIs   . Hyperlipidemia   . Hypertension   . Hypothyroidism   . Liver disease   . Neuromuscular disorder ([place])    neropathy bilateral feet.  . 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 Shortness of breath    with exertion   . Thyroid disease     Past Surgical History:  Procedure Laterality Date  . ADENOIDECTOMY    . BREAST SURGERY Right    lumpectomy-benign  . CESAREAN SECTION  1987  . CHOLECYSTECTOMY    . MASS EXCISION Left 12/16/2013   Procedure: EXCISION LEFT  DISTAL THIGH MASS;  Surgeon: Mauri Pole, MD;  Location: [facility];  Service: Orthopedics;  Laterality: Left;  . right ankle surgery      x 2- no retained hardware  . right rotator cuff surgery     .  TONSILLECTOMY    . TOTAL KNEE ARTHROPLASTY  2009   right  . TOTAL KNEE ARTHROPLASTY Left 05/05/2015   Procedure: LEFT TOTAL KNEE ARTHROPLASTY;  Surgeon: Paralee Cancel, MD;  [facility]: [facility];  Service: Orthopedics;  Laterality: Left;  . TOTAL KNEE REVISION Right 12/16/2013   Procedure: RIGHT TOTAL KNEE REVISION POLY EXCHANGE;  Surgeon: Mauri Pole, MD;  Location: [facility];  Service: Orthopedics;  Laterality: Right;  . TUBAL LIGATION  1987    There were no vitals filed for this visit.      Subjective Assessment - 03/02/16 1530    Subjective Pt reports ankle feeling ok today.    Pertinent History Rt ankle ligament repair surgery 02/04/16.  MD will call today with post-op protocol/instructions   How long can you stand comfortably? pt has been limiting this after surgery   How long can you walk comfortably? wearing CAM boot.  Pt has been limiting this.   Patient Stated Goals normalize gait, reduce pain, improve strength and mobility of the ankle   Currently in Pain? Yes   Pain Score 2    Pain Location Ankle   Pain Orientation Right   Pain Descriptors / Indicators Dull;Sharp   Pain Type Surgical  pain   Pain Onset 1 to 4 weeks ago   Pain Frequency Intermittent                         [facility] Adult PT Treatment/Exercise - 03/02/16 0001      Exercises   Exercises Ankle     Modalities   Modalities Vasopneumatic     Vasopneumatic   Number Minutes Vasopneumatic  15 minutes   Vasopnuematic Location  Ankle   Vasopneumatic Pressure Medium   Vasopneumatic Temperature  3 snowflakes     Manual Therapy   Manual Therapy Edema management;Soft tissue mobilization   Manual therapy comments Pt supine   Soft tissue mobilization plantar fascia, achilies tension, anterior ankle     Ankle Exercises: Aerobic   Stationary Bike Nustep L1 x 6 minutes  No boot, therapist present to discuss treatment     Ankle Exercises: Seated   ABC's 2 reps   BAPS Sitting;Level 2  front back,  side side, cirles   Other Seated Ankle Exercises Round rocker board   for ROM                PT Education - 03/01/16 1037    Education provided Yes   Education Details ankle A/ROM   Person(s) Educated Patient   Methods Explanation;Demonstration;Handout   Comprehension Verbalized understanding;Returned demonstration          PT Short Term Goals - 03/02/16 1532      PT SHORT TERM GOAL #1   Title be independent in initial HEP   Time 4   Period Weeks   Status On-going     PT SHORT TERM GOAL #2   Title demonstrate Rt ankle inversion and eversion P/ROM to full to improve mobility   Time 4   Period Weeks   Status On-going     PT SHORT TERM GOAL #3   Title report < or = to 3/10 Rt ankle pain with standing and walking   Time 4   Period Weeks   Status On-going     PT SHORT TERM GOAL #4   Title wean from CAM boot as allowed for home distances > or = to 75% of the time   Time 4   Period Weeks   Status On-going           PT Long Term Goals - 03/02/16 1535      PT LONG TERM GOAL #1   Title be independent in advanced HEP   Time 8   Period Weeks   Status On-going     PT LONG TERM GOAL #2   Title reduce FOTO to < or = to 48% limitation   Time 8   Period Weeks   Status On-going     PT LONG TERM GOAL #3   Title demonstrate Rt ankle A/ROM to full to improve gait and mobility   Time 8   Period Weeks   Status On-going     PT LONG TERM GOAL #4   Title wean from CAM boot for all distances and demonstrate symmetry on level surface   Time 8   Period Weeks   Status On-going     PT LONG TERM GOAL #5   Title report < or = to 2/10 Rt ankle pain with standing and walking   Time 8   Period Weeks   Status On-going     PT LONG TERM GOAL #6   Title stand and walk  for 45 minutes to allow for improved endurance for home and community tasks   Time 8   Period Weeks   Status On-going               Plan - 03/02/16 1553    Clinical Impression Statement Pt  presents with CAM boot to Rt ankle. Limited ROM with foot supination. Able to tolerate all exercises well. Edema massagme and soft tissue mobilization to Rt foot and ankle. Pt will continue to benefit from skilled therapy for ankle stability and ROM.    Rehab Potential Good   PT Frequency 3x / week   PT Duration 8 weeks   PT Treatment/Interventions ADLs/Self Care Home Management;Cryotherapy;Electrical Stimulation;Functional mobility training;Stair training;Gait training;Ultrasound;Moist Heat;Therapeutic activities;Therapeutic exercise;Neuromuscular re-education;Patient/family education;Passive range of motion;Scar mobilization;Manual techniques;Taping;Vasopneumatic Device   PT Next Visit Plan Rt ankle A/ROM, isometrics, edema management   Consulted and Agree with Plan of Care Patient      Patient will benefit from skilled therapeutic intervention in order to improve the following deficits and impairments:  Abnormal gait, Pain, Decreased strength, Increased edema, Decreased scar mobility, Impaired flexibility, Decreased activity tolerance, Decreased endurance, Decreased range of motion, Difficulty walking  Visit Diagnosis: Pain in right ankle and joints of right foot  Other abnormalities of gait and mobility  Localized edema     Problem List Patient Active Problem List   Diagnosis Date Noted  . Obese 05/07/2015  . S/P left TKA 05/05/2015  . S/P knee replacement 05/05/2015  . Former smoker 07/10/2014  . Exertional shortness of breath 06/07/2014  . Chest pain 06/07/2014  . Osteoarthritis 05/16/2014  . Alcoholic fatty liver Q000111Q  . Chronic post-traumatic stress disorder (PTSD) 05/02/2014  . Sleep disorder 05/02/2014  . Family history of alcoholism 05/02/2014  . Macrocytosis 04/19/2014  . Fatigue 08/15/2013  . Thyroid nodule 09/17/2012  . Hypothyroid 07/21/2010  . Type II diabetes mellitus, well controlled ([place]) 06/23/2010  . HTN (hypertension) 06/23/2010  . Alcohol use  disorder, severe, dependence ([place]) 06/23/2010  . Hyperlipidemia 06/23/2010  . Obesity (BMI 30-39.9) 06/23/2010  . Chronic depression 06/23/2010  . History of colonic polyps 04/12/2010    Mikle Bosworth PTA 03/02/2016, 5:12 PM  [facility] [address], [place], [place], [address] Phone: [phone]   Fax:  [phone]  Name: [name] [name] MRN: [id number] Date of Birth: 05/15/1955

## 2016-03-04 ENCOUNTER — Ambulatory Visit: Payer: No Typology Code available for payment source | Admitting: Physical Therapy

## 2016-03-04 DIAGNOSIS — M25571 Pain in right ankle and joints of right foot: Secondary | ICD-10-CM | POA: Diagnosis not present

## 2016-03-04 DIAGNOSIS — M6281 Muscle weakness (generalized): Secondary | ICD-10-CM

## 2016-03-04 DIAGNOSIS — R2689 Other abnormalities of gait and mobility: Secondary | ICD-10-CM

## 2016-03-04 DIAGNOSIS — M25671 Stiffness of right ankle, not elsewhere classified: Secondary | ICD-10-CM

## 2016-03-04 DIAGNOSIS — R600 Localized edema: Secondary | ICD-10-CM

## 2016-03-04 NOTE — Therapy (Signed)
[facility] [address], [place], [place], [address] Phone: [phone]   Fax:  [phone]  Physical Therapy Treatment  Patient Details  Name: [name] [name] MRN: [id number] Date of Birth: 1955/07/06 Referring Provider: Dorrene German, DPM  Encounter Date: 03/04/2016      PT End of Session - 03/04/16 1024    Visit Number 3   Date for PT Re-Evaluation 04/26/16   PT Start Time 0934   PT Stop Time 1031   PT Time Calculation (min) 57 min   Activity Tolerance Patient tolerated treatment well   Behavior During Therapy [facility] for tasks assessed/performed      Past Medical History:  Diagnosis Date  . Acute meniscal tear of knee    Left knee  . Alcohol problem drinking    rehab  . Anemia    menstrual related  . Anxiety   . Arthritis    right ankle-neuropathy  . Colon polyps   . Depression   . Diabetes mellitus 12/2009   type 2  . Diabetes mellitus, type II ([place])   . Evalluate for OSA (obstructive sleep apnea) 08/15/2013   No OSA on sleep study but may be underestimated.-never used cpap    . GERD (gastroesophageal reflux disease)    not currently taking.  Marland Kitchen Heart murmur   . History of recurrent UTIs   . Hyperlipidemia   . Hypertension   . Hypothyroidism   . Liver disease   . Neuromuscular disorder ([place])    neropathy bilateral feet.  . 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 Shortness of breath    with exertion   . Thyroid disease     Past Surgical History:  Procedure Laterality Date  . ADENOIDECTOMY    . BREAST SURGERY Right    lumpectomy-benign  . CESAREAN SECTION  1987  . CHOLECYSTECTOMY    . MASS EXCISION Left 12/16/2013   Procedure: EXCISION LEFT  DISTAL THIGH MASS;  Surgeon: Mauri Pole, MD;  Location: [facility];  Service: Orthopedics;  Laterality: Left;  . right ankle surgery      x 2- no retained hardware  . right rotator cuff surgery     .  TONSILLECTOMY    . TOTAL KNEE ARTHROPLASTY  2009   right  . TOTAL KNEE ARTHROPLASTY Left 05/05/2015   Procedure: LEFT TOTAL KNEE ARTHROPLASTY;  Surgeon: Paralee Cancel, MD;  Location: [facility];  Service: Orthopedics;  Laterality: Left;  . TOTAL KNEE REVISION Right 12/16/2013   Procedure: RIGHT TOTAL KNEE REVISION POLY EXCHANGE;  Surgeon: Mauri Pole, MD;  Location: [facility];  Service: Orthopedics;  Laterality: Right;  . TUBAL LIGATION  1987    There were no vitals filed for this visit.      Subjective Assessment - 03/04/16 0936    Subjective Pt states her ankle was oozing above the scab yesterday.  States it feels okay and no pain today.   Pertinent History Rt ankle ligament repair surgery 02/04/16.  MD will call today with post-op protocol/instructions   How long can you stand comfortably? pt has been limiting this after surgery   How long can you walk comfortably? wearing CAM boot.  Pt has been limiting this.   Patient Stated Goals normalize gait, reduce pain, improve strength and mobility of the ankle   Currently in Pain? No/denies  [place] [facility] PT Treatment/Exercise - 03/04/16 0001      Vasopneumatic   Number Minutes Vasopneumatic  15 minutes   Vasopnuematic Location  Ankle   Vasopneumatic Pressure Medium   Vasopneumatic Temperature  3 snowflakes     Manual Therapy   Manual Therapy Edema management;Soft tissue mobilization   Manual therapy comments Pt supine   Soft tissue mobilization plantar fascia, achilies tension, anterior ankle     Ankle Exercises: Aerobic   Stationary Bike Nustep L2 x 6 minutes     Ankle Exercises: Supine   T-Band ankle inversion, eversion, dorsiflexion, plantarflexion - yellow band 10x each     Ankle Exercises: Seated   BAPS Sitting;Level 2  front back, side side, cirles   Other Seated Ankle Exercises Round rocker board   for ROM                  PT Short Term Goals - 03/02/16 1532      PT  SHORT TERM GOAL #1   Title be independent in initial HEP   Time 4   Period Weeks   Status On-going     PT SHORT TERM GOAL #2   Title demonstrate Rt ankle inversion and eversion P/ROM to full to improve mobility   Time 4   Period Weeks   Status On-going     PT SHORT TERM GOAL #3   Title report < or = to 3/10 Rt ankle pain with standing and walking   Time 4   Period Weeks   Status On-going     PT SHORT TERM GOAL #4   Title wean from CAM boot as allowed for home distances > or = to 75% of the time   Time 4   Period Weeks   Status On-going           PT Long Term Goals - 03/02/16 1535      PT LONG TERM GOAL #1   Title be independent in advanced HEP   Time 8   Period Weeks   Status On-going     PT LONG TERM GOAL #2   Title reduce FOTO to < or = to 48% limitation   Time 8   Period Weeks   Status On-going     PT LONG TERM GOAL #3   Title demonstrate Rt ankle A/ROM to full to improve gait and mobility   Time 8   Period Weeks   Status On-going     PT LONG TERM GOAL #4   Title wean from CAM boot for all distances and demonstrate symmetry on level surface   Time 8   Period Weeks   Status On-going     PT LONG TERM GOAL #5   Title report < or = to 2/10 Rt ankle pain with standing and walking   Time 8   Period Weeks   Status On-going     PT LONG TERM GOAL #6   Title stand and walk for 45 minutes to allow for improved endurance for home and community tasks   Time 8   Period Weeks   Status On-going               Plan - 03/04/16 1025    Clinical Impression Statement Some redness around scab, but nothing alarming.  Pt contineus to have ankle weakness and decreased ROM. Manaul therapy reduced edema and redness in ankle.  Pt tolerated exercises well and reports relief form vasopneumatic.  Continues to benefit from  skilled PT in order to regain function with Rt LE.   Rehab Potential Good   PT Frequency 3x / week   PT Duration 8 weeks   PT Next Visit Plan Rt  ankle A/ROM, isometrics, edema management, LE strengthening   Consulted and Agree with Plan of Care Patient      Patient will benefit from skilled therapeutic intervention in order to improve the following deficits and impairments:  Abnormal gait, Pain, Decreased strength, Increased edema, Decreased scar mobility, Impaired flexibility, Decreased activity tolerance, Decreased endurance, Decreased range of motion, Difficulty walking  Visit Diagnosis: Pain in right ankle and joints of right foot  Other abnormalities of gait and mobility  Localized edema  Stiffness of right ankle, not elsewhere classified  Muscle weakness (generalized)     Problem List Patient Active Problem List   Diagnosis Date Noted  . Obese 05/07/2015  . S/P left TKA 05/05/2015  . S/P knee replacement 05/05/2015  . Former smoker 07/10/2014  . Exertional shortness of breath 06/07/2014  . Chest pain 06/07/2014  . Osteoarthritis 05/16/2014  . Alcoholic fatty liver [id number]  . Chronic post-traumatic stress disorder (PTSD) 05/02/2014  . Sleep disorder 05/02/2014  . Family history of alcoholism 05/02/2014  . Macrocytosis 04/19/2014  . Fatigue 08/15/2013  . Thyroid nodule 09/17/2012  . Hypothyroid 07/21/2010  . Type II diabetes mellitus, well controlled ([place]) 06/23/2010  . HTN (hypertension) 06/23/2010  . Alcohol use disorder, severe, dependence ([place]) 06/23/2010  . Hyperlipidemia 06/23/2010  . Obesity (BMI 30-39.9) 06/23/2010  . Chronic depression 06/23/2010  . History of colonic polyps 04/12/2010    Zannie Cove, PT 03/04/2016, 9:39 PM  [facility] [address], [place], [place], [address] Phone: [phone]   Fax:  [phone]  Name: [name] [name] MRN: [id number] Date of Birth: [birth year]

## 2016-03-07 ENCOUNTER — Encounter: Payer: Self-pay | Admitting: Family Medicine

## 2016-03-07 ENCOUNTER — Encounter: Payer: Self-pay | Admitting: Physical Therapy

## 2016-03-10 ENCOUNTER — Other Ambulatory Visit: Payer: Self-pay | Admitting: Family Medicine

## 2016-03-11 ENCOUNTER — Encounter: Payer: Self-pay | Admitting: Family Medicine

## 2016-03-11 ENCOUNTER — Ambulatory Visit: Payer: No Typology Code available for payment source | Admitting: Physical Therapy

## 2016-03-11 DIAGNOSIS — M25571 Pain in right ankle and joints of right foot: Secondary | ICD-10-CM | POA: Diagnosis not present

## 2016-03-11 DIAGNOSIS — R2689 Other abnormalities of gait and mobility: Secondary | ICD-10-CM

## 2016-03-11 DIAGNOSIS — M6281 Muscle weakness (generalized): Secondary | ICD-10-CM

## 2016-03-11 DIAGNOSIS — R600 Localized edema: Secondary | ICD-10-CM

## 2016-03-11 DIAGNOSIS — M25671 Stiffness of right ankle, not elsewhere classified: Secondary | ICD-10-CM

## 2016-03-11 NOTE — Therapy (Signed)
[facility] [address], [name], [place], [address] Phone: [phone]   Fax:  [phone]  Physical Therapy Treatment  Patient Details  Name: [name] [name] MRN: [id number] Date of Birth: [birth year] Referring Provider: Dorrene German, DPM  Encounter Date: 03/11/2016      PT End of Session - 03/11/16 1054    Visit Number 4   Date for PT Re-Evaluation 04/26/16   PT Start Time 1052   PT Stop Time 1147   PT Time Calculation (min) 55 min   Activity Tolerance Patient tolerated treatment well   Behavior During Therapy [facility] for tasks assessed/performed      Past Medical History:  Diagnosis Date  . Acute meniscal tear of knee    Left knee  . Alcohol problem drinking    rehab  . Anemia    menstrual related  . Anxiety   . Arthritis    right ankle-neuropathy  . Colon polyps   . Depression   . Diabetes mellitus 12/2009   type 2  . Diabetes mellitus, type II ([place])   . Evalluate for OSA (obstructive sleep apnea) 08/15/2013   No OSA on sleep study but may be underestimated.-never used cpap    . GERD (gastroesophageal reflux disease)    not currently taking.  Marland Kitchen Heart murmur   . History of recurrent UTIs   . Hyperlipidemia   . Hypertension   . Hypothyroidism   . Liver disease   . Neuromuscular disorder ([place])    neropathy bilateral feet.  . 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 Shortness of breath    with exertion   . Thyroid disease     Past Surgical History:  Procedure Laterality Date  . ADENOIDECTOMY    . BREAST SURGERY Right    lumpectomy-benign  . CESAREAN SECTION  1987  . CHOLECYSTECTOMY    . MASS EXCISION Left 12/16/2013   Procedure: EXCISION LEFT  DISTAL THIGH MASS;  Surgeon: Mauri Pole, MD;  Location: [facility];  Service: Orthopedics;  Laterality: Left;  . right ankle surgery      x 2- no retained hardware  . right rotator cuff surgery     .  TONSILLECTOMY    . TOTAL KNEE ARTHROPLASTY  2009   right  . TOTAL KNEE ARTHROPLASTY Left 05/05/2015   Procedure: LEFT TOTAL KNEE ARTHROPLASTY;  Surgeon: Paralee Cancel, MD;  Location: [facility];  Service: Orthopedics;  Laterality: Left;  . TOTAL KNEE REVISION Right 12/16/2013   Procedure: RIGHT TOTAL KNEE REVISION POLY EXCHANGE;  Surgeon: Mauri Pole, MD;  [facility];  Service: Orthopedics;  Laterality: Right;  . TUBAL LIGATION  1987    There were no vitals filed for this visit.      Subjective Assessment - 03/11/16 1053    Subjective Pt reports ankle feeling ok. No more oozing from scab but still a little red.    Pertinent History Rt ankle ligament repair surgery 02/04/16.  MD will call today with post-op protocol/instructions   How long can you stand comfortably? pt has been limiting this after surgery   How long can you walk comfortably? wearing CAM boot.  Pt has been limiting this.   Patient Stated Goals normalize gait, reduce pain, improve strength and mobility of the ankle   Currently in Pain? No/denies  [place] Adult PT Treatment/Exercise - 03/11/16 0001      Manual Therapy   Manual Therapy Joint mobilization;Passive ROM   Joint Mobilization Talo/cruel joint distraction    Passive ROM Ankle in all planes     Ankle Exercises: Seated   ABC's 2 reps   Towel Crunch 2 reps   Towel Inversion/Eversion 4 reps   BAPS Sitting;Level 2  front back, side side, cirles     Ankle Exercises: Aerobic   Stationary Bike Nustep L1 x 6 minutes  No boot, therapist present to discuss treatment     Ankle Exercises: Supine   T-Band ankle inversion, eversion, dorsiflexion, plantarflexion - yellow band 10x each                PT Education - 03/11/16 1116    Education provided Yes   Education Details ankle strenghtening   Person(s) Educated Patient   Methods Explanation;Demonstration;Handout   Comprehension Verbalized understanding           PT Short Term Goals - 03/11/16 1055      PT SHORT TERM GOAL #1   Title be independent in initial HEP   Time 4   Period Weeks   Status Achieved     PT SHORT TERM GOAL #3   Title report < or = to 3/10 Rt ankle pain with standing and walking   Time 4   Period Weeks   Status On-going     PT SHORT TERM GOAL #4   Title wean from CAM boot as allowed for home distances > or = to 75% of the time   Time 4   Period Weeks   Status On-going           PT Long Term Goals - 03/11/16 1055      PT LONG TERM GOAL #1   Title be independent in advanced HEP   Time 8   Period Weeks   Status On-going     PT LONG TERM GOAL #2   Title reduce FOTO to < or = to 48% limitation   Time 8   Period Weeks   Status On-going     PT LONG TERM GOAL #3   Title demonstrate Rt ankle A/ROM to full to improve gait and mobility   Time 8   Period Weeks   Status On-going     PT LONG TERM GOAL #4   Title wean from CAM boot for all distances and demonstrate symmetry on level surface   Time 8   Period Weeks   Status On-going     PT LONG TERM GOAL #5   Title report < or = to 2/10 Rt ankle pain with standing and walking   Time 8   Period Weeks   Status On-going     PT LONG TERM GOAL #6   Title stand and walk for 45 minutes to allow for improved endurance for home and community tasks   Time 8   Period Weeks   Status On-going               Plan - 03/11/16 1106    Clinical Impression Statement Pt reports ankle feeling ok. No change in swelling. Pt able to tolerate all exercises well, reports some pulling with stretches but tolerable. Good ROM and no increase pain with manual ROM. Pt will be out of town and will miss the next few visitis.  Pt will continue to benefit from skilled therapy for ankle ROM and strengthening.  Rehab Potential Good   PT Frequency 3x / week   PT Duration 8 weeks   PT Treatment/Interventions ADLs/Self Care Home Management;Cryotherapy;Electrical  Stimulation;Functional mobility training;Stair training;Gait training;Ultrasound;Moist Heat;Therapeutic activities;Therapeutic exercise;Neuromuscular re-education;Patient/family education;Passive range of motion;Scar mobilization;Manual techniques;Taping;Vasopneumatic Device   PT Next Visit Plan Rt ankle A/ROM, isometrics, edema management, LE strengthening   Consulted and Agree with Plan of Care Patient      Patient will benefit from skilled therapeutic intervention in order to improve the following deficits and impairments:  Abnormal gait, Pain, Decreased strength, Increased edema, Decreased scar mobility, Impaired flexibility, Decreased activity tolerance, Decreased endurance, Decreased range of motion, Difficulty walking  Visit Diagnosis: Pain in right ankle and joints of right foot  Other abnormalities of gait and mobility  Localized edema  Stiffness of right ankle, not elsewhere classified  Muscle weakness (generalized)     Problem List Patient Active Problem List   Diagnosis Date Noted  . Obese 05/07/2015  . S/P left TKA 05/05/2015  . S/P knee replacement 05/05/2015  . Former smoker 07/10/2014  . Exertional shortness of breath 06/07/2014  . Chest pain 06/07/2014  . Osteoarthritis 05/16/2014  . Alcoholic fatty liver [id number]  . Chronic post-traumatic stress disorder (PTSD) 05/02/2014  . Sleep disorder 05/02/2014  . Family history of alcoholism 05/02/2014  . Macrocytosis 04/19/2014  . Fatigue 08/15/2013  . Thyroid nodule 09/17/2012  . Hypothyroid 07/21/2010  . Type II diabetes mellitus, well controlled ([place]) 06/23/2010  . HTN (hypertension) 06/23/2010  . Alcohol use disorder, severe, dependence ([place]) 06/23/2010  . Hyperlipidemia 06/23/2010  . Obesity (BMI 30-39.9) 06/23/2010  . Chronic depression 06/23/2010  . History of colonic polyps 04/12/2010    Mikle Bosworth PTA 03/11/2016, 11:50 AM  [facility] [address], [place], [place], [address] Phone: [phone]   Fax:  [phone]  Name: [name] [name] MRN: [id number] Date of Birth: [birth year]

## 2016-03-11 NOTE — Patient Instructions (Addendum)
TOES: Towel Bunching    With involved straight toes on towel, bend toes bunching up towel. Do _1__ times per day.  Copyright  VHI. All rights reserved.  Forefoot Evertors    Place right foot flat on towel, knee pointed forward. Use forefoot and toes to push towel out to side. Do not allow heel or knee to move. Repeat __10__ times. Do _1___ sessions per day. CAUTION: Repetitions should be slow and controlled.  Copyright  VHI. All rights reserved.  Forefoot Invertors    Place right foot flat on towel, knee pointed forward. Use forefoot and toes to pull towel in toward center. Do not allow heel or knee to move. Repeat _10___ times. Do __1__ sessions per day. CAUTION: Repetitions should be slow and controlled.  Eversion: Resisted    With right foot in tubing loop, hold tubing around other foot to resist and turn foot out. Repeat _20___ times per set. Do __1__ sets per session. Do __1__ sessions per day.  http://orth.exer.us/15   Copyright  VHI. All rights reserved.  Dorsiflexion: Resisted  Strengthening Inversion (Resistive)    Secure resistive band or tubing around stable furniture. Place right foot, near toes, in loop. With outside of foot toward furniture, pull toward big toe as you roll foot upward. Repeat __20__ times. Do __1__ sessions per day.  http://gt2.exer.us/428   Copyright  VHI. All rights reserved.    Facing anchor, tubing around left foot, pull toward face.  Repeat _20___ times per set. Do __1__ sets per session. Do _1___ sessions per day.  http://orth.exer.us/9   Copyright  VHI. All rights reserved.   Copyright  VHI. All rights reserved.  Mikle Bosworth, PTA 03/11/16 11:16 AM  [facility] [address], [place], [place] [address] Phone # [phone] Fax [phone]

## 2016-03-15 ENCOUNTER — Encounter: Payer: Self-pay | Admitting: Family Medicine

## 2016-03-16 ENCOUNTER — Encounter: Payer: Self-pay | Admitting: Family Medicine

## 2016-03-24 ENCOUNTER — Encounter: Payer: Self-pay | Admitting: Family Medicine

## 2016-04-04 ENCOUNTER — Encounter: Payer: Self-pay | Admitting: Physical Therapy

## 2016-04-04 ENCOUNTER — Ambulatory Visit: Payer: No Typology Code available for payment source | Attending: Foot & Ankle Surgery | Admitting: Physical Therapy

## 2016-04-04 DIAGNOSIS — M25671 Stiffness of right ankle, not elsewhere classified: Secondary | ICD-10-CM | POA: Diagnosis present

## 2016-04-04 DIAGNOSIS — R600 Localized edema: Secondary | ICD-10-CM | POA: Diagnosis present

## 2016-04-04 DIAGNOSIS — M6281 Muscle weakness (generalized): Secondary | ICD-10-CM | POA: Diagnosis present

## 2016-04-04 DIAGNOSIS — R2689 Other abnormalities of gait and mobility: Secondary | ICD-10-CM | POA: Diagnosis present

## 2016-04-04 DIAGNOSIS — M25571 Pain in right ankle and joints of right foot: Secondary | ICD-10-CM

## 2016-04-04 NOTE — Therapy (Signed)
[facility] [address], [place], [place], [address] Phone: [phone]   Fax:  [phone]  Physical Therapy Treatment  Patient Details  Name: [name] [name] MRN: [id number] Date of Birth: [birth year] Referring Provider: Dorrene German, DPM  Encounter Date: 04/04/2016      PT End of Session - 04/04/16 1544    Visit Number 5   Date for PT Re-Evaluation 04/26/16   PT Start Time [id number]   PT Stop Time 1617   PT Time Calculation (min) 42 min   Activity Tolerance Patient tolerated treatment well   Behavior During Therapy [facility] for tasks assessed/performed      Past Medical History:  Diagnosis Date  . Acute meniscal tear of knee    Left knee  . Alcohol problem drinking    rehab  . Anemia    menstrual related  . Anxiety   . Arthritis    right ankle-neuropathy  . Colon polyps   . Depression   . Diabetes mellitus 12/2009   type 2  . Diabetes mellitus, type II ([place])   . Evalluate for OSA (obstructive sleep apnea) 08/15/2013   No OSA on sleep study but may be underestimated.-never used cpap    . GERD (gastroesophageal reflux disease)    not currently taking.  Marland Kitchen Heart murmur   . History of recurrent UTIs   . Hyperlipidemia   . Hypertension   . Hypothyroidism   . Liver disease   . Neuromuscular disorder ([place])    neropathy bilateral feet.  . 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 Shortness of breath    with exertion   . Thyroid disease     Past Surgical History:  Procedure Laterality Date  . ADENOIDECTOMY    . BREAST SURGERY Right    lumpectomy-benign  . CESAREAN SECTION  1987  . CHOLECYSTECTOMY    . MASS EXCISION Left 12/16/2013   Procedure: EXCISION LEFT  DISTAL THIGH MASS;  Surgeon: Mauri Pole, MD;  Location: [facility];  Service: Orthopedics;  Laterality: Left;  . right ankle surgery      x 2- no retained hardware  . right rotator cuff surgery     .  TONSILLECTOMY    . TOTAL KNEE ARTHROPLASTY  2009   right  . TOTAL KNEE ARTHROPLASTY Left 05/05/2015   Procedure: LEFT TOTAL KNEE ARTHROPLASTY;  Surgeon: Paralee Cancel, MD;  [facility]: [facility];  Service: Orthopedics;  Laterality: Left;  . TOTAL KNEE REVISION Right 12/16/2013   Procedure: RIGHT TOTAL KNEE REVISION POLY EXCHANGE;  Surgeon: Mauri Pole, MD;  [facility]: [facility];  Service: Orthopedics;  Laterality: Right;  . TUBAL LIGATION  1987    There were no vitals filed for this visit.      Subjective Assessment - 04/04/16 1540    Subjective Pt reports calf muscle giving away while walking occasionaly. Having pain behind ankle bone (lateral malleolus)   Pertinent History Rt ankle ligament repair surgery 02/04/16.  MD will call today with post-op protocol/instructions   How long can you stand comfortably? pt has been limiting this after surgery   How long can you walk comfortably? wearing CAM boot.  Pt has been limiting this.   Patient Stated Goals normalize gait, reduce pain, improve strength and mobility of the ankle   Currently in Pain? Yes   Pain Score 3    Pain Location Ankle   Pain Orientation Right   Pain  Descriptors / Indicators Dull;Sharp   Pain Type Surgical pain   Pain Onset 1 to 4 weeks ago   Pain Frequency Intermittent   Aggravating Factors  Standing/ walking, touching ankle   Pain Relieving Factors sitting, non weight bearing                         [facility] Adult PT Treatment/Exercise - 04/04/16 0001      Knee/Hip Exercises: Standing   Heel Raises Both;1 set;10 reps   Hip Abduction Stengthening;Both;2 sets;10 reps   Hip Extension Stengthening;Both;2 sets;10 reps     Modalities   Modalities --     Vasopneumatic   Number Minutes Vasopneumatic  --   Vasopnuematic Location  --   Vasopneumatic Pressure --   Vasopneumatic Temperature  --     Ankle Exercises: Machines for Strengthening   Cybex Leg Press #70 3 x10  Rt LE #45 3x10     Ankle  Exercises: Aerobic   Stationary Bike L3 x 6 minutes  Therapist present to discuss treatment     Ankle Exercises: Supine   T-Band ankle inversion, eversion, dorsiflexion, plantarflexion - yellow band 10x each     Ankle Exercises: Stretches   Soleus Stretch 2 reps;10 seconds   Gastroc Stretch 2 reps;10 seconds     Ankle Exercises: Seated   BAPS Sitting;Level 2  front back, side side, cirles                  PT Short Term Goals - 03/11/16 1055      PT SHORT TERM GOAL #1   Title be independent in initial HEP   Time 4   Period Weeks   Status Achieved     PT SHORT TERM GOAL #3   Title report < or = to 3/10 Rt ankle pain with standing and walking   Time 4   Period Weeks   Status On-going     PT SHORT TERM GOAL #4   Title wean from CAM boot as allowed for home distances > or = to 75% of the time   Time 4   Period Weeks   Status On-going           PT Long Term Goals - 03/11/16 1055      PT LONG TERM GOAL #1   Title be independent in advanced HEP   Time 8   Period Weeks   Status On-going     PT LONG TERM GOAL #2   Title reduce FOTO to < or = to 48% limitation   Time 8   Period Weeks   Status On-going     PT LONG TERM GOAL #3   Title demonstrate Rt ankle A/ROM to full to improve gait and mobility   Time 8   Period Weeks   Status On-going     PT LONG TERM GOAL #4   Title wean from CAM boot for all distances and demonstrate symmetry on level surface   Time 8   Period Weeks   Status On-going     PT LONG TERM GOAL #5   Title report < or = to 2/10 Rt ankle pain with standing and walking   Time 8   Period Weeks   Status On-going     PT LONG TERM GOAL #6   Title stand and walk for 45 minutes to allow for improved endurance for home and community tasks   Time 8   Period Weeks  Status On-going               Plan - 04/04/16 1617    Clinical Impression Statement Pt presents with no CAM boot. Pt saw MD while in [place] and had infection in Rt  ankle for which she took antibiotics but conitnues to have redness around incision. Pt able to tolerate all exercsies well with no increase in pain. Pt has increased tightness in Rt calf that decreased some with stretching. Pt will continue to benefit from skilled therapy for ankle strength and stability. Continue to monito incision for increased redness, swelling, or pain.    Rehab Potential Good   PT Frequency 3x / week   PT Duration 8 weeks   PT Treatment/Interventions ADLs/Self Care Home Management;Cryotherapy;Electrical Stimulation;Functional mobility training;Stair training;Gait training;Ultrasound;Moist Heat;Therapeutic activities;Therapeutic exercise;Neuromuscular re-education;Patient/family education;Passive range of motion;Scar mobilization;Manual techniques;Taping;Vasopneumatic Device   PT Next Visit Plan Rt ankle A/ROM, isometrics, edema management, LE strengthening   Consulted and Agree with Plan of Care Patient      Patient will benefit from skilled therapeutic intervention in order to improve the following deficits and impairments:  Abnormal gait, Pain, Decreased strength, Increased edema, Decreased scar mobility, Impaired flexibility, Decreased activity tolerance, Decreased endurance, Decreased range of motion, Difficulty walking  Visit Diagnosis: Pain in right ankle and joints of right foot  Other abnormalities of gait and mobility  Localized edema     Problem List Patient Active Problem List   Diagnosis Date Noted  . Obese 05/07/2015  . S/P left TKA 05/05/2015  . S/P knee replacement 05/05/2015  . Former smoker 07/10/2014  . Exertional shortness of breath 06/07/2014  . Chest pain 06/07/2014  . Osteoarthritis 05/16/2014  . Alcoholic fatty liver Q000111Q  . Chronic post-traumatic stress disorder (PTSD) 05/02/2014  . Sleep disorder 05/02/2014  . Family history of alcoholism 05/02/2014  . Macrocytosis 04/19/2014  . Fatigue 08/15/2013  . Thyroid nodule 09/17/2012   . Hypothyroid 07/21/2010  . Type II diabetes mellitus, well controlled ([place]) 06/23/2010  . HTN (hypertension) 06/23/2010  . Alcohol use disorder, severe, dependence ([place]) 06/23/2010  . Hyperlipidemia 06/23/2010  . Obesity (BMI 30-39.9) 06/23/2010  . Chronic depression 06/23/2010  . History of colonic polyps 04/12/2010    Mikle Bosworth PTA 04/04/2016, 4:21 PM  [facility] [address], [place], [place], [address] Phone: [phone]   Fax:  [phone]  Name: [name] [name] MRN: [id number] Date of Birth: [birth year]

## 2016-04-06 ENCOUNTER — Ambulatory Visit: Payer: No Typology Code available for payment source

## 2016-04-06 DIAGNOSIS — M25571 Pain in right ankle and joints of right foot: Secondary | ICD-10-CM

## 2016-04-06 DIAGNOSIS — M25671 Stiffness of right ankle, not elsewhere classified: Secondary | ICD-10-CM

## 2016-04-06 DIAGNOSIS — M6281 Muscle weakness (generalized): Secondary | ICD-10-CM

## 2016-04-06 DIAGNOSIS — R2689 Other abnormalities of gait and mobility: Secondary | ICD-10-CM

## 2016-04-06 NOTE — Therapy (Signed)
[facility] [address], [place], [place], [address] Phone: [phone]   Fax:  [phone]  Physical Therapy Treatment  Patient Details  Name: [name] [name] MRN: [id number] Date of Birth: 06/08/1955 Referring Provider: Dorrene German, DPM  Encounter Date: 04/06/2016      PT End of Session - 04/06/16 1623    Visit Number 6   Date for PT Re-Evaluation 04/26/16   PT Start Time 1534   PT Stop Time 1618   PT Time Calculation (min) 44 min   Activity Tolerance Patient tolerated treatment well   Behavior During Therapy [facility] for tasks assessed/performed      Past Medical History:  Diagnosis Date  . Acute meniscal tear of knee    Left knee  . Alcohol problem drinking    rehab  . Anemia    menstrual related  . Anxiety   . Arthritis    right ankle-neuropathy  . Colon polyps   . Depression   . Diabetes mellitus 12/2009   type 2  . Diabetes mellitus, type II ([place])   . Evalluate for OSA (obstructive sleep apnea) 08/15/2013   No OSA on sleep study but may be underestimated.-never used cpap    . GERD (gastroesophageal reflux disease)    not currently taking.  Marland Kitchen Heart murmur   . History of recurrent UTIs   . Hyperlipidemia   . Hypertension   . Hypothyroidism   . Liver disease   . Neuromuscular disorder ([place])    neropathy bilateral feet.  . 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 Shortness of breath    with exertion   . Thyroid disease     Past Surgical History:  Procedure Laterality Date  . ADENOIDECTOMY    . BREAST SURGERY Right    lumpectomy-benign  . CESAREAN SECTION  1987  . CHOLECYSTECTOMY    . MASS EXCISION Left 12/16/2013   Procedure: EXCISION LEFT  DISTAL THIGH MASS;  Surgeon: Mauri Pole, MD;  [facility];  Service: Orthopedics;  Laterality: Left;  . right ankle surgery      x 2- no retained hardware  . right rotator cuff surgery     .  TONSILLECTOMY    . TOTAL KNEE ARTHROPLASTY  2009   right  . TOTAL KNEE ARTHROPLASTY Left 05/05/2015   Procedure: LEFT TOTAL KNEE ARTHROPLASTY;  Surgeon: Paralee Cancel, MD;  [facility];  Service: Orthopedics;  Laterality: Left;  . TOTAL KNEE REVISION Right 12/16/2013   Procedure: RIGHT TOTAL KNEE REVISION POLY EXCHANGE;  Surgeon: Mauri Pole, MD;  [facility];  Service: Orthopedics;  Laterality: Right;  . TUBAL LIGATION  1987    There were no vitals filed for this visit.      Subjective Assessment - 04/06/16 1539    Subjective My Rt calf has been giving away at times.     Patient Stated Goals normalize gait, reduce pain, improve strength and mobility of the ankle   Currently in Pain? Yes   Pain Score 3    Pain Location Ankle                         [facility] Adult PT Treatment/Exercise - 04/06/16 0001      Exercises   Exercises Knee/Hip     Knee/Hip Exercises: Standing   Heel Raises Both;10 reps;2 sets   Hip Abduction Stengthening;Both;2 sets;10 reps  Hip Extension Stengthening;Both;2 sets;10 reps   Rocker Board 3 minutes   Rebounder weight shifting 3 ways: 1 minute each     Manual Therapy   Manual Therapy Joint mobilization;Passive ROM   Manual therapy comments soft tissue elongation to Rt ankle in with tissue mobilization.       Ankle Exercises: Machines for Strengthening   Cybex Leg Press 90# 3x10 bil. , 50# Rt only     Ankle Exercises: Aerobic   Stationary Bike L3 x 6 minutes  Therapist present to discuss treatment     Ankle Exercises: Seated   BAPS Sitting;Level 2  front back, side side, cirles     Ankle Exercises: Stretches   Gastroc Stretch 10 seconds;3 reps  using rockerboard                  PT Short Term Goals - 03/11/16 1055      PT SHORT TERM GOAL #1   Title be independent in initial HEP   Time 4   Period Weeks   Status Achieved     PT SHORT TERM GOAL #3   Title report < or = to 3/10 Rt ankle pain with  standing and walking   Time 4   Period Weeks   Status On-going     PT SHORT TERM GOAL #4   Title wean from CAM boot as allowed for home distances > or = to 75% of the time   Time 4   Period Weeks   Status On-going           PT Long Term Goals - 03/11/16 1055      PT LONG TERM GOAL #1   Title be independent in advanced HEP   Time 8   Period Weeks   Status On-going     PT LONG TERM GOAL #2   Title reduce FOTO to < or = to 48% limitation   Time 8   Period Weeks   Status On-going     PT LONG TERM GOAL #3   Title demonstrate Rt ankle A/ROM to full to improve gait and mobility   Time 8   Period Weeks   Status On-going     PT LONG TERM GOAL #4   Title wean from CAM boot for all distances and demonstrate symmetry on level surface   Time 8   Period Weeks   Status On-going     PT LONG TERM GOAL #5   Title report < or = to 2/10 Rt ankle pain with standing and walking   Time 8   Period Weeks   Status On-going     PT LONG TERM GOAL #6   Title stand and walk for 45 minutes to allow for improved endurance for home and community tasks   Time 8   Period Weeks   Status On-going               Plan - 04/06/16 1619    Clinical Impression Statement Pt reports instability of the Rt ankle at times. PT advised pt to wear a brace.  Pt with reduced tissue mobility around the Rt ankle and incision.  Pt able to tolerate all exercise in the clinic and demonstrates reduced proprioception with Baps board today.  Pt will continue to benefit from skilled PT for Rt ankle flexiblity, strength and edema management as needed.     Rehab Potential Good   PT Frequency 3x / week   PT Duration 8 weeks  PT Treatment/Interventions ADLs/Self Care Home Management;Cryotherapy;Electrical Stimulation;Functional mobility training;Stair training;Gait training;Ultrasound;Moist Heat;Therapeutic activities;Therapeutic exercise;Neuromuscular re-education;Patient/family education;Passive range of  motion;Scar mobilization;Manual techniques;Taping;Vasopneumatic Device   PT Next Visit Plan Rt ankle A/ROM, isometrics, edema management, LE strengthening   Consulted and Agree with Plan of Care Patient      Patient will benefit from skilled therapeutic intervention in order to improve the following deficits and impairments:  Abnormal gait, Pain, Decreased strength, Increased edema, Decreased scar mobility, Impaired flexibility, Decreased activity tolerance, Decreased endurance, Decreased range of motion, Difficulty walking  Visit Diagnosis: Pain in right ankle and joints of right foot  Other abnormalities of gait and mobility  Stiffness of right ankle, not elsewhere classified  Muscle weakness (generalized)     Problem List Patient Active Problem List   Diagnosis Date Noted  . Obese 05/07/2015  . S/P left TKA 05/05/2015  . S/P knee replacement 05/05/2015  . Former smoker 07/10/2014  . Exertional shortness of breath 06/07/2014  . Chest pain 06/07/2014  . Osteoarthritis 05/16/2014  . Alcoholic fatty liver Q000111Q  . Chronic post-traumatic stress disorder (PTSD) 05/02/2014  . Sleep disorder 05/02/2014  . Family history of alcoholism 05/02/2014  . Macrocytosis 04/19/2014  . Fatigue 08/15/2013  . Thyroid nodule 09/17/2012  . Hypothyroid 07/21/2010  . Type II diabetes mellitus, well controlled ([place]) 06/23/2010  . HTN (hypertension) 06/23/2010  . Alcohol use disorder, severe, dependence ([place]) 06/23/2010  . Hyperlipidemia 06/23/2010  . Obesity (BMI 30-39.9) 06/23/2010  . Chronic depression 06/23/2010  . History of colonic polyps 04/12/2010     Sigurd Sos, PT 04/06/16 4:25 PM  [facility] [address] [place], [place], [address] Phone: [phone]   Fax:  [phone]  Name: [name] [name] MRN: [id number] Date of Birth: Jun 26, 1955

## 2016-04-08 ENCOUNTER — Ambulatory Visit: Payer: No Typology Code available for payment source | Admitting: Physical Therapy

## 2016-04-11 ENCOUNTER — Ambulatory Visit: Payer: No Typology Code available for payment source | Admitting: Physical Therapy

## 2016-04-11 ENCOUNTER — Encounter: Payer: Self-pay | Admitting: Physical Therapy

## 2016-04-11 DIAGNOSIS — M25671 Stiffness of right ankle, not elsewhere classified: Secondary | ICD-10-CM

## 2016-04-11 DIAGNOSIS — M25571 Pain in right ankle and joints of right foot: Secondary | ICD-10-CM

## 2016-04-11 DIAGNOSIS — R2689 Other abnormalities of gait and mobility: Secondary | ICD-10-CM

## 2016-04-11 DIAGNOSIS — M6281 Muscle weakness (generalized): Secondary | ICD-10-CM

## 2016-04-11 NOTE — Therapy (Signed)
[facility] [address], [place], [place], [address] Phone: [phone]   Fax:  [phone]  Physical Therapy Treatment  Patient Details  Name: [name] [name] MRN: [id number] Date of Birth: [birth year] Referring Provider: Dorrene German, DPM  Encounter Date: 04/11/2016      PT End of Session - 04/11/16 1542    Visit Number 7   Date for PT Re-Evaluation 04/26/16   PT Start Time 1539   PT Stop Time 1625   PT Time Calculation (min) 46 min   Activity Tolerance Patient tolerated treatment well   Behavior During Therapy --      Past Medical History:  Diagnosis Date  . Acute meniscal tear of knee    Left knee  . Alcohol problem drinking    rehab  . Anemia    menstrual related  . Anxiety   . Arthritis    right ankle-neuropathy  . Colon polyps   . Depression   . Diabetes mellitus 12/2009   type 2  . Diabetes mellitus, type II ([place])   . Evalluate for OSA (obstructive sleep apnea) 08/15/2013   No OSA on sleep study but may be underestimated.-never used cpap    . GERD (gastroesophageal reflux disease)    not currently taking.  Marland Kitchen Heart murmur   . History of recurrent UTIs   . Hyperlipidemia   . Hypertension   . Hypothyroidism   . Liver disease   . Neuromuscular disorder ([place])    neropathy bilateral feet.  . 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 Shortness of breath    with exertion   . Thyroid disease     Past Surgical History:  Procedure Laterality Date  . ADENOIDECTOMY    . BREAST SURGERY Right    lumpectomy-benign  . CESAREAN SECTION  1987  . CHOLECYSTECTOMY    . MASS EXCISION Left 12/16/2013   Procedure: EXCISION LEFT  DISTAL THIGH MASS;  Surgeon: Mauri Pole, MD;  [facility];  Service: Orthopedics;  Laterality: Left;  . right ankle surgery      x 2- no retained hardware  . right rotator cuff surgery     . TONSILLECTOMY    . TOTAL KNEE  ARTHROPLASTY  2009   right  . TOTAL KNEE ARTHROPLASTY Left 05/05/2015   Procedure: LEFT TOTAL KNEE ARTHROPLASTY;  Surgeon: Paralee Cancel, MD;  [facility];  Service: Orthopedics;  Laterality: Left;  . TOTAL KNEE REVISION Right 12/16/2013   Procedure: RIGHT TOTAL KNEE REVISION POLY EXCHANGE;  Surgeon: Mauri Pole, MD;  [facility];  Service: Orthopedics;  Laterality: Right;  . TUBAL LIGATION  1987    There were no vitals filed for this visit.      Subjective Assessment - 04/11/16 1546    Subjective Did some shopping today, di dpretty good. Can stand approx 2-3 hours.    Currently in Pain? No/denies   Multiple Pain Sites No            [facility] PT Assessment - 04/11/16 0001      AROM   Right Ankle Dorsiflexion 15                     [facility] Adult PT Treatment/Exercise - 04/11/16 0001      Knee/Hip Exercises: Stretches   Gastroc Stretch Right;3 reps;20 seconds  On rocker board     Knee/Hip Exercises: Aerobic   Nustep  L2 x 10 min  Review of goals/status by PTA     Knee/Hip Exercises: Standing   Heel Raises Both;3 sets;10 reps;2 seconds  Light UE support, VC to use RT >LT   Hip Abduction Stengthening;Both;2 sets;10 reps  Requires UE support for balance   Hip Extension Stengthening;Both;2 sets;10 reps  Requires UE support for balance   Rocker Board 3 minutes   Rebounder weight shifting 3 ways: 1 minute each     Manual Therapy   Manual Therapy Joint mobilization;Passive ROM   Manual therapy comments soft tissue elongation to Rt ankle in with tissue mobilization.                    PT Short Term Goals - 04/11/16 1543      PT SHORT TERM GOAL #3   Title report < or = to 3/10 Rt ankle pain with standing and walking   Time 4   Period Weeks   Status On-going  Pain will increase after several hours on her feet.      PT SHORT TERM GOAL #4   Title wean from CAM boot as allowed for home distances > or = to 75% of the time   Time 4   Period  Weeks   Status Achieved           PT Long Term Goals - 03/11/16 1055      PT LONG TERM GOAL #1   Title be independent in advanced HEP   Time 8   Period Weeks   Status On-going     PT LONG TERM GOAL #2   Title reduce FOTO to < or = to 48% limitation   Time 8   Period Weeks   Status On-going     PT LONG TERM GOAL #3   Title demonstrate Rt ankle A/ROM to full to improve gait and mobility   Time 8   Period Weeks   Status On-going     PT LONG TERM GOAL #4   Title wean from CAM boot for all distances and demonstrate symmetry on level surface   Time 8   Period Weeks   Status On-going     PT LONG TERM GOAL #5   Title report < or = to 2/10 Rt ankle pain with standing and walking   Time 8   Period Weeks   Status On-going     PT LONG TERM GOAL #6   Title stand and walk for 45 minutes to allow for improved endurance for home and community tasks   Time 8   Period Weeks   Status On-going               Plan - 04/11/16 1542    Clinical Impression Statement Pt can stand and walk around 1-2 hours before she gets any ankle/gastroc pain. This met the  STG for pain with walking. Pt did purchase ankle brace but had to send it back because it was too big.  Ankle active dorsiflexion significantly improved form last measurement. Standing strength exercises are challenging when she is single leg stance.    Rehab Potential Good   PT Frequency 3x / week   PT Duration 8 weeks   PT Next Visit Plan Rt ankle A/ROM, isometrics, edema management, LE strengthening   Consulted and Agree with Plan of Care Patient      Patient will benefit from skilled therapeutic intervention in order to improve the following deficits and impairments:  Abnormal gait,  Pain, Decreased strength, Increased edema, Decreased scar mobility, Impaired flexibility, Decreased activity tolerance, Decreased endurance, Decreased range of motion, Difficulty walking  Visit Diagnosis: Pain in right ankle and joints of  right foot  Other abnormalities of gait and mobility  Stiffness of right ankle, not elsewhere classified  Muscle weakness (generalized)     Problem List Patient Active Problem List   Diagnosis Date Noted  . Obese 05/07/2015  . S/P left TKA 05/05/2015  . S/P knee replacement 05/05/2015  . Former smoker 07/10/2014  . Exertional shortness of breath 06/07/2014  . Chest pain 06/07/2014  . Osteoarthritis 05/16/2014  . Alcoholic fatty liver 55/25/8948  . Chronic post-traumatic stress disorder (PTSD) 05/02/2014  . Sleep disorder 05/02/2014  . Family history of alcoholism 05/02/2014  . Macrocytosis 04/19/2014  . Fatigue 08/15/2013  . Thyroid nodule 09/17/2012  . Hypothyroid 07/21/2010  . Type II diabetes mellitus, well controlled ([place]) 06/23/2010  . HTN (hypertension) 06/23/2010  . Alcohol use disorder, severe, dependence ([place]) 06/23/2010  . Hyperlipidemia 06/23/2010  . Obesity (BMI 30-39.9) 06/23/2010  . Chronic depression 06/23/2010  . History of colonic polyps 04/12/2010    [name] [name], PTA 04/11/2016, 4:29 PM  [facility] [address] [place], [place], [address] Phone: [phone]   Fax:  [phone]  Name: [name] [name] MRN: [id number] Date of Birth: 08-31-1955

## 2016-04-13 ENCOUNTER — Ambulatory Visit: Payer: No Typology Code available for payment source | Admitting: Physical Therapy

## 2016-04-13 ENCOUNTER — Encounter: Payer: Self-pay | Admitting: Physical Therapy

## 2016-04-13 DIAGNOSIS — M25571 Pain in right ankle and joints of right foot: Secondary | ICD-10-CM

## 2016-04-13 DIAGNOSIS — M25671 Stiffness of right ankle, not elsewhere classified: Secondary | ICD-10-CM

## 2016-04-13 DIAGNOSIS — R2689 Other abnormalities of gait and mobility: Secondary | ICD-10-CM

## 2016-04-13 DIAGNOSIS — M6281 Muscle weakness (generalized): Secondary | ICD-10-CM

## 2016-04-13 NOTE — Therapy (Signed)
[facility] [address], [place], [place], [address] Phone: [phone]   Fax:  [phone]  Physical Therapy Treatment  Patient Details  Name: [name] [name] MRN: [id number] Date of Birth: 21-Sep-1955 Referring Provider: Dorrene German, DPM  Encounter Date: 04/13/2016      PT End of Session - 04/13/16 1538    Visit Number 8   Date for PT Re-Evaluation 04/26/16   PT Start Time 1534   PT Stop Time 1613   PT Time Calculation (min) 39 min   Activity Tolerance Patient tolerated treatment well   Behavior During Therapy [facility] for tasks assessed/performed      Past Medical History:  Diagnosis Date  . Acute meniscal tear of knee    Left knee  . Alcohol problem drinking    rehab  . Anemia    menstrual related  . Anxiety   . Arthritis    right ankle-neuropathy  . Colon polyps   . Depression   . Diabetes mellitus 12/2009   type 2  . Diabetes mellitus, type II ([place])   . Evalluate for OSA (obstructive sleep apnea) 08/15/2013   No OSA on sleep study but may be underestimated.-never used cpap    . GERD (gastroesophageal reflux disease)    not currently taking.  Marland Kitchen Heart murmur   . History of recurrent UTIs   . Hyperlipidemia   . Hypertension   . Hypothyroidism   . Liver disease   . Neuromuscular disorder ([place])    neropathy bilateral feet.  . 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 Shortness of breath    with exertion   . Thyroid disease     Past Surgical History:  Procedure Laterality Date  . ADENOIDECTOMY    . BREAST SURGERY Right    lumpectomy-benign  . CESAREAN SECTION  1987  . CHOLECYSTECTOMY    . MASS EXCISION Left 12/16/2013   Procedure: EXCISION LEFT  DISTAL THIGH MASS;  Surgeon: Mauri Pole, MD;  Location: [facility];  Service: Orthopedics;  Laterality: Left;  . right ankle surgery      x 2- no retained hardware  . right rotator cuff surgery     .  TONSILLECTOMY    . TOTAL KNEE ARTHROPLASTY  2009   right  . TOTAL KNEE ARTHROPLASTY Left 05/05/2015   Procedure: LEFT TOTAL KNEE ARTHROPLASTY;  Surgeon: Paralee Cancel, MD;  [facility];  Service: Orthopedics;  Laterality: Left;  . TOTAL KNEE REVISION Right 12/16/2013   Procedure: RIGHT TOTAL KNEE REVISION POLY EXCHANGE;  Surgeon: Mauri Pole, MD;  [facility];  Service: Orthopedics;  Laterality: Right;  . TUBAL LIGATION  1987    There were no vitals filed for this visit.      Subjective Assessment - 04/13/16 1535    Subjective Feeling sore today. Increased sorenss on Monday (7/10) in the knees. Probably from increased weight and massage, but massage felt good.    Pertinent History Rt ankle ligament repair surgery 02/04/16.  MD will call today with post-op protocol/instructions   How long can you stand comfortably? pt has been limiting this after surgery   Patient Stated Goals normalize gait, reduce pain, improve strength and mobility of the ankle   Currently in Pain? Yes   Pain Score 1    Pain Location Ankle   Pain Orientation Right   Pain Descriptors / Indicators Dull;Sharp   Pain Type Surgical pain  Pain Onset 1 to 4 weeks ago   Pain Frequency Intermittent   Aggravating Factors  Standing/ walking, touching ankle   Pain Relieving Factors sitting, non weight bearing   Multiple Pain Sites No                         [facility] Adult PT Treatment/Exercise - 04/13/16 0001      Knee/Hip Exercises: Stretches   Gastroc Stretch Right;3 reps;20 seconds  On rocker board     Knee/Hip Exercises: Aerobic   Nustep L3 x 10 min  Review of goals/status by PTA     Knee/Hip Exercises: Standing   Heel Raises Both;3 sets;10 reps;2 seconds  Light UE support, VC to use RT >LT   Hip Flexion Stengthening;Both;2 sets;10 reps  UE support   Hip Abduction Stengthening;Both;2 sets;10 reps  Requires UE support for balance   Hip Extension Stengthening;Both;2 sets;10 reps   Requires UE support for balance   Rocker Board 3 minutes   Rebounder weight shifting 3 ways: 1 minute each   Walking with Sports Cord #25 forward/backward x5 each     Ankle Exercises: Seated   BAPS Sitting;Level 2  front back, side side, cirles     Ankle Exercises: Machines for Strengthening   Cybex Leg Press 110# 3x10 bil. , 60# Rt only                  PT Short Term Goals - 04/11/16 1543      PT SHORT TERM GOAL #3   Title report < or = to 3/10 Rt ankle pain with standing and walking   Time 4   Period Weeks   Status On-going  Pain will increase after several hours on her feet.      PT SHORT TERM GOAL #4   Title wean from CAM boot as allowed for home distances > or = to 75% of the time   Time 4   Period Weeks   Status Achieved           PT Long Term Goals - 03/11/16 1055      PT LONG TERM GOAL #1   Title be independent in advanced HEP   Time 8   Period Weeks   Status On-going     PT LONG TERM GOAL #2   Title reduce FOTO to < or = to 48% limitation   Time 8   Period Weeks   Status On-going     PT LONG TERM GOAL #3   Title demonstrate Rt ankle A/ROM to full to improve gait and mobility   Time 8   Period Weeks   Status On-going     PT LONG TERM GOAL #4   Title wean from CAM boot for all distances and demonstrate symmetry on level surface   Time 8   Period Weeks   Status On-going     PT LONG TERM GOAL #5   Title report < or = to 2/10 Rt ankle pain with standing and walking   Time 8   Period Weeks   Status On-going     PT LONG TERM GOAL #6   Title stand and walk for 45 minutes to allow for improved endurance for home and community tasks   Time 8   Period Weeks   Status On-going               Plan - 04/13/16 1602    Clinical Impression Statement Pt  did well with all exercises today with no increase in pain. Pt wearing new shoes with more arch support. Pt will continue to benefit from skilled therapy for ankle strength and  stability for dynamic and static balance.    Rehab Potential Good   PT Frequency 3x / week   PT Duration 8 weeks   PT Treatment/Interventions ADLs/Self Care Home Management;Cryotherapy;Electrical Stimulation;Functional mobility training;Stair training;Gait training;Ultrasound;Moist Heat;Therapeutic activities;Therapeutic exercise;Neuromuscular re-education;Patient/family education;Passive range of motion;Scar mobilization;Manual techniques;Taping;Vasopneumatic Device   PT Next Visit Plan Side stepping, dynamic balance, ROM   Consulted and Agree with Plan of Care Patient      Patient will benefit from skilled therapeutic intervention in order to improve the following deficits and impairments:  Abnormal gait, Pain, Decreased strength, Increased edema, Decreased scar mobility, Impaired flexibility, Decreased activity tolerance, Decreased endurance, Decreased range of motion, Difficulty walking  Visit Diagnosis: Pain in right ankle and joints of right foot  Other abnormalities of gait and mobility  Stiffness of right ankle, not elsewhere classified  Muscle weakness (generalized)     Problem List Patient Active Problem List   Diagnosis Date Noted  . Obese 05/07/2015  . S/P left TKA 05/05/2015  . S/P knee replacement 05/05/2015  . Former smoker 07/10/2014  . Exertional shortness of breath 06/07/2014  . Chest pain 06/07/2014  . Osteoarthritis 05/16/2014  . Alcoholic fatty liver Q000111Q  . Chronic post-traumatic stress disorder (PTSD) 05/02/2014  . Sleep disorder 05/02/2014  . Family history of alcoholism 05/02/2014  . Macrocytosis 04/19/2014  . Fatigue 08/15/2013  . Thyroid nodule 09/17/2012  . Hypothyroid 07/21/2010  . Type II diabetes mellitus, well controlled ([place]) 06/23/2010  . HTN (hypertension) 06/23/2010  . Alcohol use disorder, severe, dependence ([place]) 06/23/2010  . Hyperlipidemia 06/23/2010  . Obesity (BMI 30-39.9) 06/23/2010  . Chronic depression 06/23/2010  .  History of colonic polyps 04/12/2010    Mikle Bosworth PTA 04/13/2016, 4:20 PM  [facility] [address] [place], [place], [address] Phone: [phone]   Fax:  [phone]  Name: [name] [name] MRN: [id number] Date of Birth: 17-May-1955

## 2016-04-15 ENCOUNTER — Ambulatory Visit: Payer: No Typology Code available for payment source | Admitting: Physical Therapy

## 2016-04-15 ENCOUNTER — Encounter: Payer: Self-pay | Admitting: Physical Therapy

## 2016-04-15 DIAGNOSIS — R600 Localized edema: Secondary | ICD-10-CM

## 2016-04-15 DIAGNOSIS — M25571 Pain in right ankle and joints of right foot: Secondary | ICD-10-CM | POA: Diagnosis not present

## 2016-04-15 DIAGNOSIS — R2689 Other abnormalities of gait and mobility: Secondary | ICD-10-CM

## 2016-04-15 DIAGNOSIS — M6281 Muscle weakness (generalized): Secondary | ICD-10-CM

## 2016-04-15 DIAGNOSIS — M25671 Stiffness of right ankle, not elsewhere classified: Secondary | ICD-10-CM

## 2016-04-15 NOTE — Therapy (Signed)
[facility] [address] [place], [place], [address] Phone: [phone]   Fax:  [phone]  Physical Therapy Treatment  Patient Details  Name: [name] [name] MRN: [id number] Date of Birth: January 29, 1956 Referring Provider: Dorrene German, DPM  Encounter Date: 04/15/2016      PT End of Session - 04/15/16 0808    Visit Number 9   Date for PT Re-Evaluation 04/26/16   PT Start Time 0805   PT Stop Time 0846   PT Time Calculation (min) 41 min   Activity Tolerance Patient tolerated treatment well   Behavior During Therapy [facility] for tasks assessed/performed      Past Medical History:  Diagnosis Date  . Acute meniscal tear of knee    Left knee  . Alcohol problem drinking    rehab  . Anemia    menstrual related  . Anxiety   . Arthritis    right ankle-neuropathy  . Colon polyps   . Depression   . Diabetes mellitus 12/2009   type 2  . Diabetes mellitus, type II ([place])   . Evalluate for OSA (obstructive sleep apnea) 08/15/2013   No OSA on sleep study but may be underestimated.-never used cpap    . GERD (gastroesophageal reflux disease)    not currently taking.  Marland Kitchen Heart murmur   . History of recurrent UTIs   . Hyperlipidemia   . Hypertension   . Hypothyroidism   . Liver disease   . Neuromuscular disorder ([place])    neropathy bilateral feet.  . 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 Shortness of breath    with exertion   . Thyroid disease     Past Surgical History:  Procedure Laterality Date  . ADENOIDECTOMY    . BREAST SURGERY Right    lumpectomy-benign  . CESAREAN SECTION  1987  . CHOLECYSTECTOMY    . MASS EXCISION Left 12/16/2013   Procedure: EXCISION LEFT  DISTAL THIGH MASS;  Surgeon: Mauri Pole, MD;  [facility];  Service: Orthopedics;  Laterality: Left;  . right ankle surgery      x 2- no retained hardware  . right rotator cuff surgery     .  TONSILLECTOMY    . TOTAL KNEE ARTHROPLASTY  2009   right  . TOTAL KNEE ARTHROPLASTY Left 05/05/2015   Procedure: LEFT TOTAL KNEE ARTHROPLASTY;  Surgeon: Paralee Cancel, MD;  [facility];  Service: Orthopedics;  Laterality: Left;  . TOTAL KNEE REVISION Right 12/16/2013   Procedure: RIGHT TOTAL KNEE REVISION POLY EXCHANGE;  Surgeon: Mauri Pole, MD;  [facility];  Service: Orthopedics;  Laterality: Right;  . TUBAL LIGATION  1987    There were no vitals filed for this visit.      Subjective Assessment - 04/15/16 0812    Subjective Denies pain this morning, still feels tight in the front of the ankle   Pertinent History Rt ankle ligament repair surgery 02/04/16.  MD will call today with post-op protocol/instructions   How long can you stand comfortably? not limited but feels it afterwards   How long can you walk comfortably? not limited but feels it later   Patient Stated Goals normalize gait, reduce pain, improve strength and mobility of the ankle   Currently in Pain? No/denies                         [facility] Adult  PT Treatment/Exercise - 04/15/16 0001      Knee/Hip Exercises: Stretches   Gastroc Stretch Right;3 reps;20 seconds  On rocker board     Knee/Hip Exercises: Aerobic   Nustep L3 x 10 min  Review of goals/status by PT     Knee/Hip Exercises: Standing   Heel Raises --   Hip Flexion --   Hip Abduction --   Hip Extension --   Lateral Step Up Right;20 reps;Hand Hold: 1  Bosu   Forward Step Up Right;20 reps;Hand Hold: 1  BOSU ball round side   Other Standing Knee Exercises side step with red band - 2 laps     Manual Therapy   Manual Therapy Joint mobilization;Passive ROM   Manual therapy comments soft tissue trigger point release peroneals   Joint Mobilization Talo/cruel joint distraction      Ankle Exercises: Machines for Strengthening   Cybex Leg Press 120# 3x10 bil. , 60# Rt only     Ankle Exercises: Seated   BAPS Sitting;Level 2   front back, side side, cirles                  PT Short Term Goals - 04/15/16 0810      PT SHORT TERM GOAL #3   Title report < or = to 3/10 Rt ankle pain with standing and walking   Baseline at the end of the day sometimes more than 3/10   Time 4   Period Weeks   Status On-going           PT Long Term Goals - 04/15/16 [id number]      PT LONG TERM GOAL #1   Title be independent in advanced HEP   Time 8   Period Weeks   Status On-going     PT LONG TERM GOAL #2   Title reduce FOTO to < or = to 48% limitation   Time 8   Period Weeks   Status On-going     PT LONG TERM GOAL #3   Title demonstrate Rt ankle A/ROM to full to improve gait and mobility   Baseline 12 degrees on Rt; 15 deg left ankle (still feels some stiffness when walking)   Time 8   Period Weeks   Status On-going     PT LONG TERM GOAL #4   Title wean from CAM boot for all distances and demonstrate symmetry on level surface   Time 8   Period Weeks   Status Achieved     PT LONG TERM GOAL #5   Title report < or = to 2/10 Rt ankle pain with standing and walking   Time 8   Period Weeks   Status On-going     PT LONG TERM GOAL #6   Title stand and walk for 45 minutes to allow for improved endurance for home and community tasks   Time 8   Period Weeks   Status Achieved               Plan - 04/15/16 0815    Clinical Impression Statement Patient did well with BOSU step ups and addition of side stepping with band.  Patient had increased dorsiflexion PROM to 12 degrees.  Benefits from skilled PT for improved strength and stability on R   Rehab Potential Good   PT Treatment/Interventions ADLs/Self Care Home Management;Cryotherapy;Electrical Stimulation;Functional mobility training;Stair training;Gait training;Ultrasound;Moist Heat;Therapeutic activities;Therapeutic exercise;Neuromuscular re-education;Patient/family education;Passive range of motion;Scar mobilization;Manual  techniques;Taping;Vasopneumatic Device   PT Next Visit Plan ROM, dynamic  balance   Consulted and Agree with Plan of Care Patient      Patient will benefit from skilled therapeutic intervention in order to improve the following deficits and impairments:  Abnormal gait, Pain, Decreased strength, Increased edema, Decreased scar mobility, Impaired flexibility, Decreased activity tolerance, Decreased endurance, Decreased range of motion, Difficulty walking  Visit Diagnosis: Pain in right ankle and joints of right foot  Other abnormalities of gait and mobility  Stiffness of right ankle, not elsewhere classified  Muscle weakness (generalized)  Localized edema     Problem List Patient Active Problem List   Diagnosis Date Noted  . Obese 05/07/2015  . S/P left TKA 05/05/2015  . S/P knee replacement 05/05/2015  . Former smoker 07/10/2014  . Exertional shortness of breath 06/07/2014  . Chest pain 06/07/2014  . Osteoarthritis 05/16/2014  . Alcoholic fatty liver Q000111Q  . Chronic post-traumatic stress disorder (PTSD) 05/02/2014  . Sleep disorder 05/02/2014  . Family history of alcoholism 05/02/2014  . Macrocytosis 04/19/2014  . Fatigue 08/15/2013  . Thyroid nodule 09/17/2012  . Hypothyroid 07/21/2010  . Type II diabetes mellitus, well controlled ([place]) 06/23/2010  . HTN (hypertension) 06/23/2010  . Alcohol use disorder, severe, dependence ([place]) 06/23/2010  . Hyperlipidemia 06/23/2010  . Obesity (BMI 30-39.9) 06/23/2010  . Chronic depression 06/23/2010  . History of colonic polyps 04/12/2010    Zannie Cove, PT 04/15/2016, 12:06 PM  [facility] [address] [place], [place], [address] Phone: [phone]   Fax:  [phone]  Name: [name] [name] MRN: [id number] Date of Birth: [birth year]

## 2016-04-18 ENCOUNTER — Ambulatory Visit: Payer: No Typology Code available for payment source

## 2016-04-18 DIAGNOSIS — M25571 Pain in right ankle and joints of right foot: Secondary | ICD-10-CM

## 2016-04-18 DIAGNOSIS — M25671 Stiffness of right ankle, not elsewhere classified: Secondary | ICD-10-CM

## 2016-04-18 DIAGNOSIS — R2689 Other abnormalities of gait and mobility: Secondary | ICD-10-CM

## 2016-04-18 DIAGNOSIS — M6281 Muscle weakness (generalized): Secondary | ICD-10-CM

## 2016-04-18 NOTE — Therapy (Signed)
[facility] [address], [place], [place], [address] Phone: [phone]   Fax:  [phone]  Physical Therapy Treatment  Patient Details  Name: [name] [name] MRN: [id number] Date of Birth: [birth year] Referring Provider: Dorrene German, DPM  Encounter Date: 04/18/2016      PT End of Session - 04/18/16 1614    Visit Number 10   Date for PT Re-Evaluation 04/26/16   PT Start Time [id number]   PT Stop Time 1614   PT Time Calculation (min) 44 min   Activity Tolerance Patient tolerated treatment well   Behavior During Therapy [facility] for tasks assessed/performed      Past Medical History:  Diagnosis Date  . Acute meniscal tear of knee    Left knee  . Alcohol problem drinking    rehab  . Anemia    menstrual related  . Anxiety   . Arthritis    right ankle-neuropathy  . Colon polyps   . Depression   . Diabetes mellitus 12/2009   type 2  . Diabetes mellitus, type II ([place])   . Evalluate for OSA (obstructive sleep apnea) 08/15/2013   No OSA on sleep study but may be underestimated.-never used cpap    . GERD (gastroesophageal reflux disease)    not currently taking.  Marland Kitchen Heart murmur   . History of recurrent UTIs   . Hyperlipidemia   . Hypertension   . Hypothyroidism   . Liver disease   . Neuromuscular disorder ([place])    neropathy bilateral feet.  . 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 Shortness of breath    with exertion   . Thyroid disease     Past Surgical History:  Procedure Laterality Date  . ADENOIDECTOMY    . BREAST SURGERY Right    lumpectomy-benign  . CESAREAN SECTION  1987  . CHOLECYSTECTOMY    . MASS EXCISION Left 12/16/2013   Procedure: EXCISION LEFT  DISTAL THIGH MASS;  Surgeon: Mauri Pole, MD;  [facility];  Service: Orthopedics;  Laterality: Left;  . right ankle surgery      x 2- no retained hardware  . right rotator cuff surgery     .  TONSILLECTOMY    . TOTAL KNEE ARTHROPLASTY  2009   right  . TOTAL KNEE ARTHROPLASTY Left 05/05/2015   Procedure: LEFT TOTAL KNEE ARTHROPLASTY;  Surgeon: Paralee Cancel, MD;  [facility];  Service: Orthopedics;  Laterality: Left;  . TOTAL KNEE REVISION Right 12/16/2013   Procedure: RIGHT TOTAL KNEE REVISION POLY EXCHANGE;  Surgeon: Mauri Pole, MD;  [facility];  Service: Orthopedics;  Laterality: Right;  . TUBAL LIGATION  1987    There were no vitals filed for this visit.      Subjective Assessment - 04/18/16 1534    Subjective Wearing ankle braces for support now.     Pertinent History Rt ankle ligament repair surgery 02/04/16.  MD will call today with post-op protocol/instructions   Currently in Pain? No/denies                         [facility] Adult PT Treatment/Exercise - 04/18/16 0001      Knee/Hip Exercises: Stretches   Gastroc Stretch Right;3 reps;20 seconds  On rocker board     Knee/Hip Exercises: Aerobic   Nustep L3 x 10 min  Review of goals/status by PT     Knee/Hip  Exercises: Standing   Heel Raises Both;3 sets;10 reps;2 seconds  Light UE support, VC to use RT >LT   Lateral Step Up Right;20 reps;Hand Hold: 1  Bosu   Forward Step Up Right;20 reps;Hand Hold: 1  BOSU ball round side   Rebounder weight shifting 3 ways: 1 minute each   Walking with Sports Cord #25 forward/backward and sidestepping x5 each     Manual Therapy   Manual Therapy Joint mobilization;Passive ROM   Manual therapy comments soft tissue trigger point release peroneals   Joint Mobilization Talo/cruel joint distraction      Ankle Exercises: Machines for Strengthening   Cybex Leg Press 120# 3x10 bil. , 60# Rt only                  PT Short Term Goals - 04/15/16 0810      PT SHORT TERM GOAL #3   Title report < or = to 3/10 Rt ankle pain with standing and walking   Baseline at the end of the day sometimes more than 3/10   Time 4   Period Weeks   Status  On-going           PT Long Term Goals - 04/18/16 1537      PT LONG TERM GOAL #1   Title be independent in advanced HEP   Time 8   Period Weeks   Status On-going     PT LONG TERM GOAL #4   Title wean from CAM boot for all distances and demonstrate symmetry on level surface   Status Achieved     PT LONG TERM GOAL #5   Title report < or = to 2/10 Rt ankle pain with standing and walking   Status Achieved     PT LONG TERM GOAL #6   Title stand and walk for 45 minutes to allow for improved endurance for home and community tasks   Status Achieved               Plan - 04/18/16 1544    Clinical Impression Statement Pt is making progress with Rt ankle strength, mobility and gait.  Pt is wearing ankle brace for stability for longer distance ambulation.  Pt denies any limitation with walking long distances related to Rt ankle.  Pt will continue to benefit from strength/stability of the Rt ankle and we will progress this program over the next 3 sessions.     Rehab Potential Good   PT Frequency 3x / week   PT Duration 8 weeks   PT Treatment/Interventions ADLs/Self Care Home Management;Cryotherapy;Electrical Stimulation;Functional mobility training;Stair training;Gait training;Ultrasound;Moist Heat;Therapeutic activities;Therapeutic exercise;Neuromuscular re-education;Patient/family education;Passive range of motion;Scar mobilization;Manual techniques;Taping;Vasopneumatic Device   PT Next Visit Plan 3 more sessions probable.  D/C 04/25/16 to comprehensive HEP for strength, flexiblity and stability.     Consulted and Agree with Plan of Care Patient      Patient will benefit from skilled therapeutic intervention in order to improve the following deficits and impairments:  Abnormal gait, Pain, Decreased strength, Increased edema, Decreased scar mobility, Impaired flexibility, Decreased activity tolerance, Decreased endurance, Decreased range of motion, Difficulty walking  Visit  Diagnosis: Pain in right ankle and joints of right foot  Other abnormalities of gait and mobility  Stiffness of right ankle, not elsewhere classified  Muscle weakness (generalized)     Problem List Patient Active Problem List   Diagnosis Date Noted  . Obese 05/07/2015  . S/P left TKA 05/05/2015  . S/P knee replacement 05/05/2015  .  Former smoker 07/10/2014  . Exertional shortness of breath 06/07/2014  . Chest pain 06/07/2014  . Osteoarthritis 05/16/2014  . Alcoholic fatty liver Q000111Q  . Chronic post-traumatic stress disorder (PTSD) 05/02/2014  . Sleep disorder 05/02/2014  . Family history of alcoholism 05/02/2014  . Macrocytosis 04/19/2014  . Fatigue 08/15/2013  . Thyroid nodule 09/17/2012  . Hypothyroid 07/21/2010  . Type II diabetes mellitus, well controlled ([place]) 06/23/2010  . HTN (hypertension) 06/23/2010  . Alcohol use disorder, severe, dependence ([place]) 06/23/2010  . Hyperlipidemia 06/23/2010  . Obesity (BMI 30-39.9) 06/23/2010  . Chronic depression 06/23/2010  . History of colonic polyps 04/12/2010     Sigurd Sos, PT 04/18/16 4:15 PM  [facility] [address], [place], [place], [address] Phone: [phone]   Fax:  [phone]  Name: [name] [name] MRN: [id number] Date of Birth: May 21, 1955

## 2016-04-19 ENCOUNTER — Other Ambulatory Visit: Payer: Self-pay

## 2016-04-20 ENCOUNTER — Telehealth: Payer: Self-pay

## 2016-04-20 ENCOUNTER — Ambulatory Visit: Payer: No Typology Code available for payment source

## 2016-04-20 ENCOUNTER — Encounter: Payer: Self-pay | Admitting: Family Medicine

## 2016-04-20 NOTE — Telephone Encounter (Signed)
PT called pt due to no-show appointment at 3:30 today.  Left message on pt voicemail.

## 2016-04-21 ENCOUNTER — Telehealth: Payer: Self-pay | Admitting: Family Medicine

## 2016-04-21 NOTE — Telephone Encounter (Signed)
Rejection from [employer]. States was denied by Dannielle Karvonen, MD.   States to call [phone] for peer to peer review MF 8am- 8 pm  From x-ray:  Subtle nodular density at the right lung base. Pulmonary nodule is not entirely excluded. Recommend CT for more definitive analysis.

## 2016-04-22 ENCOUNTER — Ambulatory Visit: Payer: No Typology Code available for payment source | Attending: Foot & Ankle Surgery | Admitting: Physical Therapy

## 2016-04-22 DIAGNOSIS — R2689 Other abnormalities of gait and mobility: Secondary | ICD-10-CM

## 2016-04-22 DIAGNOSIS — M6281 Muscle weakness (generalized): Secondary | ICD-10-CM

## 2016-04-22 DIAGNOSIS — M25671 Stiffness of right ankle, not elsewhere classified: Secondary | ICD-10-CM | POA: Diagnosis present

## 2016-04-22 DIAGNOSIS — M25571 Pain in right ankle and joints of right foot: Secondary | ICD-10-CM

## 2016-04-22 DIAGNOSIS — R600 Localized edema: Secondary | ICD-10-CM | POA: Diagnosis present

## 2016-04-22 NOTE — Telephone Encounter (Signed)
Authorization # [id number] Chest CT through 06/19/2016  Neoma Laming and Roselyn Reef- can we get this set up now?

## 2016-04-25 ENCOUNTER — Encounter: Payer: Self-pay | Admitting: Family Medicine

## 2016-04-25 ENCOUNTER — Encounter: Payer: Self-pay | Admitting: Physical Therapy

## 2016-04-27 ENCOUNTER — Encounter: Payer: Self-pay | Admitting: Physical Therapy

## 2016-04-28 ENCOUNTER — Other Ambulatory Visit: Payer: Self-pay

## 2016-04-29 ENCOUNTER — Encounter: Payer: Self-pay | Admitting: Physical Therapy

## 2016-04-29 NOTE — Therapy (Signed)
[facility] [address], [place], [place], [address] Phone: [phone]   Fax:  [phone]  Physical Therapy Treatment  Patient Details  Name: [name] [name] MRN: [id number] Date of Birth: November 24, 1955 Referring Provider: Dorrene German, DPM  Encounter Date: 04/22/2016      PT End of Session - 04/29/16 1714    Visit Number 11   Date for PT Re-Evaluation 04/26/16   PT Start Time 1058   PT Stop Time 1140   PT Time Calculation (min) 42 min   Activity Tolerance Patient tolerated treatment well   Behavior During Therapy [facility] for tasks assessed/performed      Past Medical History:  Diagnosis Date  . Acute meniscal tear of knee    Left knee  . Alcohol problem drinking    rehab  . Anemia    menstrual related  . Anxiety   . Arthritis    right ankle-neuropathy  . Colon polyps   . Depression   . Diabetes mellitus 12/2009   type 2  . Diabetes mellitus, type II ([place])   . Evalluate for OSA (obstructive sleep apnea) 08/15/2013   No OSA on sleep study but may be underestimated.-never used cpap    . GERD (gastroesophageal reflux disease)    not currently taking.  Marland Kitchen Heart murmur   . History of recurrent UTIs   . Hyperlipidemia   . Hypertension   . Hypothyroidism   . Liver disease   . Neuromuscular disorder ([place])    neropathy bilateral feet.  . 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 Shortness of breath    with exertion   . Thyroid disease     Past Surgical History:  Procedure Laterality Date  . ADENOIDECTOMY    . BREAST SURGERY Right    lumpectomy-benign  . CESAREAN SECTION  1987  . CHOLECYSTECTOMY    . MASS EXCISION Left 12/16/2013   Procedure: EXCISION LEFT  DISTAL THIGH MASS;  Surgeon: Mauri Pole, MD;  Location: [facility];  Service: Orthopedics;  Laterality: Left;  . right ankle surgery      x 2- no retained hardware  . right rotator cuff surgery     .  TONSILLECTOMY    . TOTAL KNEE ARTHROPLASTY  2009   right  . TOTAL KNEE ARTHROPLASTY Left 05/05/2015   Procedure: LEFT TOTAL KNEE ARTHROPLASTY;  Surgeon: Paralee Cancel, MD;  Location: [facility];  Service: Orthopedics;  Laterality: Left;  . TOTAL KNEE REVISION Right 12/16/2013   Procedure: RIGHT TOTAL KNEE REVISION POLY EXCHANGE;  Surgeon: Mauri Pole, MD;  Location: [facility];  Service: Orthopedics;  Laterality: Right;  . TUBAL LIGATION  1987    There were no vitals filed for this visit.      Subjective Assessment - 04/29/16 1013    Subjective Just a little stiffness in the front when I walk.  I am doing well with all my exercises at home.   Pertinent History Rt ankle ligament repair surgery 02/04/16.  MD will call today with post-op protocol/instructions   Patient Stated Goals normalize gait, reduce pain, improve strength and mobility of the ankle   Currently in Pain? No/denies                         [facility] Adult PT Treatment/Exercise - 04/29/16 0001      Knee/Hip Exercises: Stretches   Gastroc Stretch Right;3 reps;20 seconds  On rocker board     Knee/Hip Exercises: Aerobic   Stationary Bike L0 x 4 min   Nustep L3 x 6 min  Review of goals/status by PT     Knee/Hip Exercises: Standing   Heel Raises Both;3 sets;10 reps;2 seconds  No UE support, VC to use RT >LT   Lateral Step Up Right;20 reps;Hand Hold: 1  Bosu   Forward Step Up Right;20 reps;Hand Hold: 1  BOSU ball round side   SLS foam mat and BOSU 3x to fatigue   Walking with Sports Cord #25 forward/backward and sidestepping x5 each     Manual Therapy   Manual Therapy Joint mobilization;Passive ROM   Manual therapy comments soft tissue trigger point release peroneals   Joint Mobilization Talo/cruel joint distraction                   PT Short Term Goals - 04/22/16 1103      PT SHORT TERM GOAL #1   Title be independent in initial HEP   Time 4   Period Weeks   Status Achieved     PT SHORT  TERM GOAL #2   Title demonstrate Rt ankle inversion and eversion P/ROM to full to improve mobility   Time 4   Period Weeks     PT SHORT TERM GOAL #3   Title report < or = to 3/10 Rt ankle pain with standing and walking   Baseline 2/10 most of the time   Time 4   Period Weeks   Status Achieved     PT SHORT TERM GOAL #4   Title wean from CAM boot as allowed for home distances > or = to 75% of the time   Time 4   Period Weeks   Status Achieved           PT Long Term Goals - 04/22/16 1105      PT LONG TERM GOAL #1   Title be independent in advanced HEP   Time 8   Period Weeks   Status Achieved     PT LONG TERM GOAL #2   Title reduce FOTO to < or = to 48% limitation   Baseline 34%   Time 8   Period Weeks   Status Achieved     PT LONG TERM GOAL #3   Title demonstrate Rt ankle A/ROM to full to improve gait and mobility   Baseline 12 degrees on Rt; 15 deg left ankle (still feels some stiffness when walking)   Time 8   Period Weeks   Status Not Met     PT LONG TERM GOAL #4   Title wean from CAM boot for all distances and demonstrate symmetry on level surface   Time 8   Period Weeks   Status Achieved     PT LONG TERM GOAL #5   Title report < or = to 2/10 Rt ankle pain with standing and walking   Time 8   Period Weeks   Status Achieved     PT LONG TERM GOAL #6   Title stand and walk for 45 minutes to allow for improved endurance for home and community tasks   Time 8   Period Weeks   Status Achieved               Plan - 04/29/16 1713    Clinical Impression Statement Patient states she is going on a trip and feels like she is able to do everyting she  wants.  Patient is currently independent with HEP for strengthening and ROM.   Rehab Potential Good   PT Treatment/Interventions ADLs/Self Care Home Management;Cryotherapy;Electrical Stimulation;Functional mobility training;Stair training;Gait training;Ultrasound;Moist Heat;Therapeutic activities;Therapeutic  exercise;Neuromuscular re-education;Patient/family education;Passive range of motion;Scar mobilization;Manual techniques;Taping;Vasopneumatic Device   PT Next Visit Plan discharged with HEP   Consulted and Agree with Plan of Care Patient      Patient will benefit from skilled therapeutic intervention in order to improve the following deficits and impairments:  Abnormal gait, Pain, Decreased strength, Increased edema, Decreased scar mobility, Impaired flexibility, Decreased activity tolerance, Decreased endurance, Decreased range of motion, Difficulty walking  Visit Diagnosis: Pain in right ankle and joints of right foot  Other abnormalities of gait and mobility  Stiffness of right ankle, not elsewhere classified  Muscle weakness (generalized)  Localized edema     Problem List Patient Active Problem List   Diagnosis Date Noted  . Obese 05/07/2015  . S/P left TKA 05/05/2015  . S/P knee replacement 05/05/2015  . Former smoker 07/10/2014  . Exertional shortness of breath 06/07/2014  . Chest pain 06/07/2014  . Osteoarthritis 05/16/2014  . Alcoholic fatty liver 38/88/2800  . Chronic post-traumatic stress disorder (PTSD) 05/02/2014  . Sleep disorder 05/02/2014  . Family history of alcoholism 05/02/2014  . Macrocytosis 04/19/2014  . Fatigue 08/15/2013  . Thyroid nodule 09/17/2012  . Hypothyroid 07/21/2010  . Type II diabetes mellitus, well controlled ([place]) 06/23/2010  . HTN (hypertension) 06/23/2010  . Alcohol use disorder, severe, dependence ([place]) 06/23/2010  . Hyperlipidemia 06/23/2010  . Obesity (BMI 30-39.9) 06/23/2010  . Chronic depression 06/23/2010  . History of colonic polyps 04/12/2010    Zannie Cove, PT 04/29/2016, 5:15 PM  [facility] [address], [place], [place], [address] Phone: [phone]   Fax:  [phone]  Name: Anitha Kreiser MRN: [id number] Date of Birth: 11-12-1955  PHYSICAL  THERAPY DISCHARGE SUMMARY  Visits from Start of Care: 11  Current functional level related to goals / functional outcomes: See goals above   Remaining deficits: See above details   Education / Equipment: HEP  Plan: Patient agrees to discharge.  Patient goals were partially met. Patient is being discharged due to being pleased with the current functional level.  ?????

## 2016-05-02 ENCOUNTER — Encounter: Payer: Self-pay | Admitting: Family Medicine

## 2016-05-05 ENCOUNTER — Encounter: Payer: Self-pay | Admitting: Family Medicine

## 2016-05-17 ENCOUNTER — Other Ambulatory Visit (INDEPENDENT_AMBULATORY_CARE_PROVIDER_SITE_OTHER): Payer: No Typology Code available for payment source

## 2016-05-17 ENCOUNTER — Other Ambulatory Visit: Payer: Self-pay

## 2016-05-17 DIAGNOSIS — E119 Type 2 diabetes mellitus without complications: Secondary | ICD-10-CM

## 2016-05-17 DIAGNOSIS — E785 Hyperlipidemia, unspecified: Secondary | ICD-10-CM

## 2016-05-17 LAB — TSH: TSH: 0.89 u[IU]/mL (ref 0.35–4.50)

## 2016-05-17 LAB — LIPID PANEL
Cholesterol: 184 mg/dL (ref 0–200)
HDL: 67.6 mg/dL (ref >39.00)
LDL Cholesterol: 104 mg/dL — ABNORMAL HIGH (ref 0–99)
NonHDL: 116.12
Total CHOL/HDL Ratio: 3
Triglycerides: 61 mg/dL (ref 0.0–149.0)
VLDL: 12.2 mg/dL (ref 0.0–40.0)

## 2016-05-17 LAB — BASIC METABOLIC PANEL
BUN: 16 mg/dL (ref 6–23)
CO2: 26 mEq/L (ref 19–32)
Calcium: 10 mg/dL (ref 8.4–10.5)
Chloride: 101 mEq/L (ref 96–112)
Creatinine, Ser: 0.83 mg/dL (ref 0.40–1.20)
GFR: 74.09 mL/min (ref >60.00)
Glucose, Bld: 117 mg/dL — ABNORMAL HIGH (ref 70–99)
Potassium: 4.2 mEq/L (ref 3.5–5.1)
Sodium: 138 mEq/L (ref 135–145)

## 2016-05-17 LAB — HEMOGLOBIN A1C: Hgb A1c MFr Bld: 5.3 % (ref 4.6–6.5)

## 2016-05-17 LAB — HEPATIC FUNCTION PANEL
ALT: 15 U/L (ref 0–35)
AST: 20 U/L (ref 0–37)
Albumin: 4 g/dL (ref 3.5–5.2)
Alkaline Phosphatase: 81 U/L (ref 39–117)
Bilirubin, Direct: 0.3 mg/dL (ref 0.0–0.3)
Total Bilirubin: 1.6 mg/dL — ABNORMAL HIGH (ref 0.2–1.2)
Total Protein: 6.6 g/dL (ref 6.0–8.3)

## 2016-05-17 LAB — CBC
HCT: 41.7 % (ref 36.0–46.0)
Hemoglobin: 14.7 g/dL (ref 12.0–15.0)
MCHC: 35.1 g/dL (ref 30.0–36.0)
MCV: 93.3 fl (ref 78.0–100.0)
Platelets: 192 10*3/uL (ref 150.0–400.0)
RBC: 4.47 Mil/uL (ref 3.87–5.11)
RDW: 13.7 % (ref 11.5–15.5)
WBC: 6.2 10*3/uL (ref 4.0–10.5)

## 2016-05-19 ENCOUNTER — Other Ambulatory Visit: Payer: Self-pay | Admitting: Family Medicine

## 2016-05-19 DIAGNOSIS — E119 Type 2 diabetes mellitus without complications: Secondary | ICD-10-CM

## 2016-05-19 DIAGNOSIS — E039 Hypothyroidism, unspecified: Secondary | ICD-10-CM

## 2016-05-20 ENCOUNTER — Encounter: Payer: Self-pay | Admitting: Family Medicine

## 2016-05-24 ENCOUNTER — Encounter: Payer: Self-pay | Admitting: Family Medicine

## 2016-05-24 ENCOUNTER — Ambulatory Visit (INDEPENDENT_AMBULATORY_CARE_PROVIDER_SITE_OTHER): Payer: No Typology Code available for payment source | Admitting: Family Medicine

## 2016-05-24 VITALS — BP 132/70 | HR 71 | Temp 98.3°F | Ht 66.5 in | Wt 258.6 lb

## 2016-05-24 DIAGNOSIS — Z0000 Encounter for general adult medical examination without abnormal findings: Secondary | ICD-10-CM | POA: Diagnosis not present

## 2016-05-24 DIAGNOSIS — F1020 Alcohol dependence, uncomplicated: Secondary | ICD-10-CM | POA: Diagnosis not present

## 2016-05-24 DIAGNOSIS — E119 Type 2 diabetes mellitus without complications: Secondary | ICD-10-CM

## 2016-05-24 DIAGNOSIS — E785 Hyperlipidemia, unspecified: Secondary | ICD-10-CM | POA: Diagnosis not present

## 2016-05-24 MED ORDER — SIMVASTATIN 20 MG PO TABS
20.0000 mg | ORAL_TABLET | Freq: Every day | ORAL | 3 refills | Status: DC
Start: 2016-05-24 — End: 2017-04-21

## 2016-05-24 NOTE — Progress Notes (Signed)
Pre visit review using our clinic review tool, if applicable. No additional management support is needed unless otherwise documented below in the visit note. 

## 2016-05-24 NOTE — Patient Instructions (Addendum)
Please let me know which ENT doctors are covered   Need to start exercising- gradually build up from 5 minutes a day. Add 2 minutes a week until you get to 30 minutes a day 5 days a week.   No changes in medicine today other than new start of simvastaitn 20mg . See me in 3 months and with labs a few days before

## 2016-05-24 NOTE — Assessment & Plan Note (Signed)
Off of statins due to alcoholic LFTs elevations. Will retrial 05/24/16 with simvastatin 20mg  with 3 month follow up labs with LFT and direct LDL

## 2016-05-24 NOTE — Assessment & Plan Note (Signed)
Encouraged need for healthy eating, regular exercise, weight loss.  

## 2016-05-24 NOTE — Progress Notes (Signed)
Phone: [phone]  Subjective:  Patient presents today for their annual physical. Chief complaint-noted.   See problem oriented charting- ROS- full  review of systems was completed and negative except for: shortness of breath with hills or stairs- no issues flat ground. Headaches at times. No chest pain.   The following were reviewed and entered/updated in epic: Past Medical History:  Diagnosis Date  . Acute meniscal tear of knee    Left knee  . Alcohol problem drinking    rehab  . Anemia    menstrual related  . Anxiety   . Arthritis    right ankle-neuropathy  . Colon polyps   . Depression   . Diabetes mellitus 12/2009   type 2  . Diabetes mellitus, type II ([place])   . Evalluate for OSA (obstructive sleep apnea) 08/15/2013   No OSA on sleep study but may be underestimated.-never used cpap    . GERD (gastroesophageal reflux disease)    not currently taking.  Marland Kitchen Heart murmur   . History of recurrent UTIs   . Hyperlipidemia   . Hypertension   . Hypothyroidism   . Liver disease   . Neuromuscular disorder ([place])    neropathy bilateral feet.  . 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 Shortness of breath    with exertion   . Thyroid disease    Patient Active Problem List   Diagnosis Date Noted  . Alcoholic fatty liver 97/03/6376    Priority: High  . Type II diabetes mellitus, well controlled ([place]) 06/23/2010    Priority: High  . Alcohol use disorder, severe, dependence ([place]) 06/23/2010    Priority: High  . Morbid obesity ([place]) 06/23/2010    Priority: High  . Chronic depression 06/23/2010    Priority: High  . Hypothyroid 07/21/2010    Priority: Medium  . HTN (hypertension) 06/23/2010    Priority: Medium  . Hyperlipidemia 06/23/2010    Priority: Medium  . Former smoker 07/10/2014    Priority: Low  . Osteoarthritis 05/16/2014    Priority: Low  . Chronic post-traumatic stress disorder (PTSD) 05/02/2014    Priority: Low  .  Sleep disorder 05/02/2014    Priority: Low  . Family history of alcoholism 05/02/2014    Priority: Low  . Macrocytosis 04/19/2014    Priority: Low  . Fatigue 08/15/2013    Priority: Low  . Thyroid nodule 09/17/2012    Priority: Low  . History of colonic polyps 04/12/2010    Priority: Low  . S/P left TKA 05/05/2015  . S/P knee replacement 05/05/2015  . Exertional shortness of breath 06/07/2014  . Chest pain 06/07/2014   Past Surgical History:  Procedure Laterality Date  . ADENOIDECTOMY    . BREAST SURGERY Right    lumpectomy-benign  . CESAREAN SECTION  1987  . CHOLECYSTECTOMY    . MASS EXCISION Left 12/16/2013   Procedure: EXCISION LEFT  DISTAL THIGH MASS;  Surgeon: Mauri Pole, MD;  Location: [facility];  Service: Orthopedics;  Laterality: Left;  . right ankle surgery      x 2- no retained hardware  . right rotator cuff surgery     . TONSILLECTOMY    . TOTAL KNEE ARTHROPLASTY  2009   right  . TOTAL KNEE ARTHROPLASTY Left 05/05/2015   Procedure: LEFT TOTAL KNEE ARTHROPLASTY;  Surgeon: Paralee Cancel, MD;  Location: [facility];  Service: Orthopedics;  Laterality: Left;  . TOTAL KNEE REVISION Right 12/16/2013  Procedure: RIGHT TOTAL KNEE REVISION POLY EXCHANGE;  Surgeon: Mauri Pole, MD;  Location: [facility];  Service: Orthopedics;  Laterality: Right;  . TUBAL LIGATION  1987    Family History  Problem Relation Age of Onset  . Adopted: Yes  . COPD Mother   . Alcohol abuse Father     Medications- reviewed and updated Current Outpatient Prescriptions  Medication Sig Dispense Refill  . ACCU-CHEK FASTCLIX LANCETS MISC Use to test blood sugars daily. Dx: E11.9 100 each 11  . B Complex Vitamins (B COMPLEX 100 PO) Take 1 tablet by mouth daily.     . benazepril (LOTENSIN) 20 MG tablet TAKE 1 TABLET (20 MG TOTAL) BY MOUTH DAILY. 90 tablet 3  . buPROPion (WELLBUTRIN XL) 150 MG 24 hr tablet Take 1 tablet (150 mg total) by mouth daily. 90 tablet 3  . escitalopram (LEXAPRO) 20 MG tablet  TAKE 1 TABLET BY MOUTH EVERY DAY 90 tablet 1  . flurbiprofen (ANSAID) 100 MG tablet Take 100 mg by mouth 2 (two) times daily as needed.    . gabapentin (NEURONTIN) 300 MG capsule Take 300 mg by mouth 2 (two) times daily.     Marland Kitchen glimepiride (AMARYL) 2 MG tablet TAKE 1 TABLET (2 MG TOTAL) BY MOUTH DAILY WITH BREAKFAST. 90 tablet 3  . glucose blood (ONETOUCH VERIO) test strip USE TO CHECK BLOOD SUGAR DAILY AND PRN 100 each 12  . JANUMET XR 50-1000 MG TB24 TAKE 2 TABLETS BY MOUTH DAILY. 180 tablet 3  . levothyroxine (SYNTHROID, LEVOTHROID) 88 MCG tablet TAKE 1 TABLET (88 MCG TOTAL) BY MOUTH DAILY. 90 tablet 3  . traZODone (DESYREL) 50 MG tablet TAKE 1 TABLET AT BEDTIME AS NEEDED,MAY REPEAT 1 TIME IF NEEDED FOR SLEEP 90 tablet 3   No current facility-administered medications for this visit.     Allergies-reviewed and updated Allergies  Allergen Reactions  . Adhesive [Tape] Other (See Comments)    blisters  . Nitrofurantoin     Diffuse rash  . Other Other (See Comments)    (Neoprene)--causes blisters.     Social History   Social History  . Marital status: Married    Spouse name: N/A  . Number of children: N/A  . Years of education: N/A   Social History Main Topics  . Smoking status: Former Smoker    Packs/day: 1.50    Years: 30.00    Types: Cigarettes    Quit date: 02/21/2009  . Smokeless tobacco: Never Used  . Alcohol use 14.4 oz/week    10 Glasses of wine, 14 Cans of beer per week     Comment: quit January 2015 started in august 2015 , quit again 3-26- 2016, past hx ETOH abuse  . Drug use: No     Comment: marijuana in past  . Sexual activity: Yes   Other Topics Concern  . None   Social History Narrative   Married. 1 daughter. No grandkids. 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       Hobbies: shopping, travel    Objective: BP 132/70 (BP Location: Left Arm, Patient Position: Sitting, Cuff Size: Large)   Pulse 71   Temp 98.3 F (36.8  C) (Oral)   Ht 5' 6.5" (1.689 m)   Wt 258 lb 9.6 oz (117.3 kg)   SpO2 94%   BMI 41.11 kg/m  Gen: NAD, resting comfortably HEENT: Mucous membranes are moist. Oropharynx normal Neck: no thyromegaly CV: RRR no  murmurs rubs or gallops Lungs: CTAB no crackles, wheeze, rhonchi Abdomen: soft/nontender/nondistended/normal bowel sounds. No rebound or guarding.  Ext: no edema Skin: warm, dry Neuro: grossly normal, moves all extremities, PERRLA  Diabetic Foot Exam - Simple   Simple Foot Form Diabetic Foot exam was performed with the following findings:  Yes 05/24/2016 11:55 AM  Visual Inspection No deformities, no ulcerations, no other skin breakdown bilaterally:  Yes Sensation Testing Intact to touch and monofilament testing bilaterally:  Yes Pulse Check Posterior Tibialis and Dorsalis pulse intact bilaterally:  Yes Comments    Assessment/Plan:  61 y.o. female presenting for annual physical.  Health Maintenance counseling: 1. Anticipatory guidance: Patient counseled regarding regular dental exams q6 months, eye exams yearly in september, wearing seatbelts.  2. Risk factor reduction:  Advised patient of need for regular exercise and diet rich and fruits and vegetables to reduce risk of heart attack and stroke. Exercise- not at all- strongly encouraged regular exercise 150 minutes per week. Diet-thinks gabapentin causing weight gain- being weaned off. She knows she needs weight loss  Wt Readings from Last 3 Encounters:  05/24/16 258 lb 9.6 oz (117.3 kg)  02/23/16 256 lb 6.4 oz (116.3 kg)  01/07/16 260 lb 9.6 oz (118.2 kg)   3. Immunizations/screenings/ancillary studies Immunization History  Administered Date(s) Administered  . Influenza Split 01/06/2011  . Influenza,inj,Quad PF,36+ Mos 12/14/2012, 11/21/2013, 11/06/2014  . Influenza-Unspecified 11/27/2015  . Pneumococcal Conjugate-13 10/17/2013  . Pneumococcal Polysaccharide-23 03/13/2015  . Pneumococcal-Unspecified 02/21/2006  .  Tdap 06/06/2012  . Zoster 10/09/2014   4. Cervical cancer screening- 04/30/15 with 3 year repeat 5. Breast cancer screening-  breast exam declined and mammogram 12/30/15 6. Colon cancer screening - 12/18/12 with 5 year repeat  Status of chronic or acute concerns   Depression/anxiety controlled on lexapro 20mg  and wellbutrin 150mg . Insomnia controlled with trazodone.PHQ2 of 0. 2 years alcohol free  Hypothyroidism- at goal on levothyroxine 88 mcg Lab Results  Component Value Date   TSH 0.89 05/17/2016   HTN controlled on benazepril 20mg   For headaches- neurologist weaning her off gabapentin as causing weight gain.   Right ear suspect effusion. Hearing loss and fullness 2-3 months. Needs ENT referral but needs to check with her insurance first- will let me know. She understands importance of this follow up  Morbid obesity ([place]) Encouraged need for healthy eating, regular exercise, weight loss.    Type II diabetes mellitus, well controlled ([place]) DM- janumet 50-1000mg  2 tablets daily XR. Restarted amaryl a few months ago as saw a lot of fluctuations and this has stabilized. Very rare lows usually in 64s- once noted 2- quickly corrected. Advised not to skp meals.  Lab Results  Component Value Date   HGBA1C 5.3 05/17/2016     Alcohol use disorder, severe, dependence ([place]) Still alcohol free since 05/12/14- has reached 2 year mark! Congratulated on effort  Hyperlipidemia Off of statins due to alcoholic LFTs elevations. Will retrial 05/24/16 with simvastatin 20mg  with 3 month follow up labs with LFT and direct LDL  3 month visit approximately with labs a few days before  Orders Placed This Encounter  Procedures  . Comprehensive metabolic panel    [place]    Standing Status:   Future    Standing Expiration Date:   05/24/2017  . LDL cholesterol, direct    [place]    Standing Status:   Future    Standing Expiration Date:   05/24/2017  . Hemoglobin A1c    [place]  Standing Status:    Future    Standing Expiration Date:   05/24/2017    Meds ordered this encounter  Medications  . simvastatin (ZOCOR) 20 MG tablet    Sig: Take 1 tablet (20 mg total) by mouth at bedtime.    Dispense:  90 tablet    Refill:  3    Return precautions advised.   [name], MD

## 2016-05-24 NOTE — Assessment & Plan Note (Signed)
DM- janumet 50-1000mg  2 tablets daily XR. Restarted amaryl a few months ago as saw a lot of fluctuations and this has stabilized. Very rare lows usually in 64s- once noted 42- quickly corrected. Advised not to skp meals.  Lab Results  Component Value Date   HGBA1C 5.3 05/17/2016

## 2016-05-24 NOTE — Assessment & Plan Note (Signed)
Still alcohol free since 05/12/14- has reached 2 year mark! Congratulated on effort

## 2016-06-01 ENCOUNTER — Other Ambulatory Visit: Payer: Self-pay | Admitting: Family Medicine

## 2016-06-07 ENCOUNTER — Ambulatory Visit (INDEPENDENT_AMBULATORY_CARE_PROVIDER_SITE_OTHER): Payer: No Typology Code available for payment source

## 2016-06-07 DIAGNOSIS — R911 Solitary pulmonary nodule: Secondary | ICD-10-CM

## 2016-06-07 MED ORDER — IOPAMIDOL (ISOVUE-300) INJECTION 61%
100.0000 mL | Freq: Once | INTRAVENOUS | Status: AC | PRN
  Administered 2016-06-07: 80 mL via INTRAVENOUS

## 2016-06-08 ENCOUNTER — Encounter: Payer: Self-pay | Admitting: Family Medicine

## 2016-06-08 DIAGNOSIS — R911 Solitary pulmonary nodule: Secondary | ICD-10-CM | POA: Insufficient documentation

## 2016-06-14 ENCOUNTER — Encounter: Payer: Self-pay | Admitting: Family Medicine

## 2016-07-21 ENCOUNTER — Encounter: Payer: Self-pay | Admitting: Family Medicine

## 2016-07-28 ENCOUNTER — Encounter: Payer: Self-pay | Admitting: Family Medicine

## 2016-07-28 DIAGNOSIS — G43909 Migraine, unspecified, not intractable, without status migrainosus: Secondary | ICD-10-CM | POA: Insufficient documentation

## 2016-07-29 ENCOUNTER — Other Ambulatory Visit: Payer: Self-pay

## 2016-07-29 DIAGNOSIS — Z8669 Personal history of other diseases of the nervous system and sense organs: Secondary | ICD-10-CM

## 2016-08-10 ENCOUNTER — Encounter: Payer: Self-pay | Admitting: Family Medicine

## 2016-08-12 ENCOUNTER — Encounter: Payer: Self-pay | Admitting: Family Medicine

## 2016-08-22 ENCOUNTER — Other Ambulatory Visit: Payer: Self-pay

## 2016-08-22 LAB — LIPID PANEL: LDL Cholesterol: 53

## 2016-08-22 LAB — HEMOGLOBIN A1C: Hemoglobin A1C: 5.1

## 2016-08-22 LAB — BASIC METABOLIC PANEL
BUN: 9 (ref 4–21)
Creatinine: 0.9 (ref 0.5–1.1)
Glucose: 81
Potassium: 4.3 (ref 3.4–5.3)
Sodium: 140 (ref 137–147)

## 2016-08-25 ENCOUNTER — Encounter: Payer: Self-pay | Admitting: Family Medicine

## 2016-08-29 ENCOUNTER — Encounter: Payer: Self-pay | Admitting: Family Medicine

## 2016-08-29 ENCOUNTER — Ambulatory Visit (INDEPENDENT_AMBULATORY_CARE_PROVIDER_SITE_OTHER): Payer: No Typology Code available for payment source | Admitting: Family Medicine

## 2016-08-29 DIAGNOSIS — E119 Type 2 diabetes mellitus without complications: Secondary | ICD-10-CM

## 2016-08-29 DIAGNOSIS — E785 Hyperlipidemia, unspecified: Secondary | ICD-10-CM | POA: Diagnosis not present

## 2016-08-29 DIAGNOSIS — E039 Hypothyroidism, unspecified: Secondary | ICD-10-CM

## 2016-08-29 DIAGNOSIS — I10 Essential (primary) hypertension: Secondary | ICD-10-CM

## 2016-08-29 DIAGNOSIS — R300 Dysuria: Secondary | ICD-10-CM

## 2016-08-29 NOTE — Assessment & Plan Note (Addendum)
S: well controlled. On janumet 50-1000mg  2 tablets daily XR.  CBGs- this mornign was 120 but has been as high as 165 and was feeling slightly off so thought a1c would be higher. Has not had any lows since last visit.   Lab Results  Component Value Date   HGBA1C 5.1 08/22/2016   HGBA1C 5.3 05/17/2016   HGBA1C 5.1 12/21/2015   A/P: update a1c before next visit- appears well controlled. Lost 6 lbs- praised progress.

## 2016-08-29 NOTE — Assessment & Plan Note (Signed)
S: controlled on benazepril 20mg .  ASCVD 10 year risk calculation if age 62-79: on statin BP Readings from Last 3 Encounters:  08/29/16 132/62  05/24/16 132/70  02/23/16 (!) 136/58  A/P: We discussed blood pressure goal of <140/90. Continue current meds:  Well controlled

## 2016-08-29 NOTE — Assessment & Plan Note (Signed)
S: well controlled on simvastaitn 20mg  with LDL <70. No myalgias.  Lab Results  Component Value Date   CHOL 184 05/17/2016   HDL 67.60 05/17/2016   LDLCALC 53 08/22/2016   LDLDIRECT 158.6 02/26/2014   TRIG 61.0 05/17/2016   CHOLHDL 3 05/17/2016   A/P: will look for labs in my inbasket to see if had LFTs- if not ok to hold until next visit as generally low risk on statin and no longer on alcohol.

## 2016-08-29 NOTE — Assessment & Plan Note (Signed)
S: Lab Results  Component Value Date   TSH 0.89 05/17/2016   On thyroid medication-levothyroxine 88 mcg A/P: rx pad- written for tsh before follow up

## 2016-08-29 NOTE — Patient Instructions (Addendum)
No changes today  Get labs at least a week before visit  Great job losing 6 lbs! Lets target another 6 lbs off by follow up at least

## 2016-08-29 NOTE — Progress Notes (Signed)
Subjective:  [name] [name] is a 61 y.o. year old very pleasant female patient who presents for/with See problem oriented charting ROS- No chest pain or shortness of breath. No headache or blurry vision. Has not used alcohol.    Past Medical History-  Patient Active Problem List   Diagnosis Date Noted  . Solitary pulmonary nodule 06/08/2016    Priority: High  . Alcoholic fatty liver 35/32/9924    Priority: High  . Type II diabetes mellitus, well controlled ([place]) 06/23/2010    Priority: High  . Alcohol use disorder, severe, dependence ([place]) 06/23/2010    Priority: High  . Morbid obesity ([place]) 06/23/2010    Priority: High  . Chronic depression 06/23/2010    Priority: High  . Migraine 07/28/2016    Priority: Medium  . Hypothyroid 07/21/2010    Priority: Medium  . HTN (hypertension) 06/23/2010    Priority: Medium  . Hyperlipidemia 06/23/2010    Priority: Medium  . Former smoker 07/10/2014    Priority: Low  . Osteoarthritis 05/16/2014    Priority: Low  . Chronic post-traumatic stress disorder (PTSD) 05/02/2014    Priority: Low  . Sleep disorder 05/02/2014    Priority: Low  . Family history of alcoholism 05/02/2014    Priority: Low  . Macrocytosis 04/19/2014    Priority: Low  . Fatigue 08/15/2013    Priority: Low  . Thyroid nodule 09/17/2012    Priority: Low  . History of colonic polyps 04/12/2010    Priority: Low  . S/P left TKA 05/05/2015  . S/P knee replacement 05/05/2015  . Exertional shortness of breath 06/07/2014  . Chest pain 06/07/2014    Medications- reviewed and updated Current Outpatient Prescriptions  Medication Sig Dispense Refill  . ACCU-CHEK FASTCLIX LANCETS MISC Use to test blood sugars daily. Dx: E11.9 100 each 11  . buPROPion (WELLBUTRIN XL) 150 MG 24 hr tablet Take 1 tablet (150 mg total) by mouth daily. 90 tablet 3  . escitalopram (LEXAPRO) 20 MG tablet TAKE 1 TABLET BY MOUTH EVERY DAY 90 tablet 1  . flurbiprofen (ANSAID) 100 MG tablet Take 100  mg by mouth 2 (two) times daily as needed.    Marland Kitchen glimepiride (AMARYL) 2 MG tablet TAKE 1 TABLET (2 MG TOTAL) BY MOUTH DAILY WITH BREAKFAST. 90 tablet 3  . glucose blood (ONETOUCH VERIO) test strip USE TO CHECK BLOOD SUGAR DAILY AND PRN 100 each 12  . JANUMET XR 50-1000 MG TB24 TAKE 2 TABLETS BY MOUTH DAILY. 180 tablet 3  . levETIRAcetam (KEPPRA XR) 500 MG 24 hr tablet Increase by 500 mg every 2 weeks as needed and as tolerated up to 3000 mg at bedtime    . levothyroxine (SYNTHROID, LEVOTHROID) 88 MCG tablet TAKE 1 TABLET (88 MCG TOTAL) BY MOUTH DAILY. 90 tablet 3  . PRESCRIPTION MEDICATION Lidocaine 4% Nasal Spray as needed    . promethazine (PHENERGAN) 25 MG tablet Take 25 mg by mouth as needed for nausea or vomiting.    . simvastatin (ZOCOR) 20 MG tablet Take 1 tablet (20 mg total) by mouth at bedtime. 90 tablet 3  . traZODone (DESYREL) 50 MG tablet TAKE 1 TABLET AT BEDTIME AS NEEDED,MAY REPEAT 1 TIME IF NEEDED FOR SLEEP 90 tablet 3   No current facility-administered medications for this visit.     Objective: BP 132/62 (BP Location: Left Arm, Patient Position: Sitting, Cuff Size: Large)   Pulse 79   Temp 98.9 F (37.2 C) (Oral)   Ht 5' 6.5" (1.689 m)  Wt 252 lb (114.3 kg)   SpO2 94%   BMI 40.06 kg/m  Gen: NAD, resting comfortably CV: RRR no murmurs rubs or gallops Lungs: CTAB no crackles, wheeze, rhonchi Abdomen: obese Ext: no edema Skin: warm, dry Neuro: grossly normal, moves all extremities  Assessment/Plan:  Hypothyroid S: Lab Results  Component Value Date   TSH 0.89 05/17/2016   On thyroid medication-levothyroxine 88 mcg A/P: rx pad- written for tsh before follow up  HTN (hypertension) S: controlled on benazepril 20mg .  ASCVD 10 year risk calculation if age 4-79: on statin BP Readings from Last 3 Encounters:  08/29/16 132/62  05/24/16 132/70  02/23/16 (!) 136/58  A/P: We discussed blood pressure goal of <140/90. Continue current meds:  Well controlled   Type  II diabetes mellitus, well controlled ([place]) S: well controlled. On janumet 50-1000mg  2 tablets daily XR.  CBGs- this mornign was 120 but has been as high as 165 and was feeling slightly off so thought a1c would be higher. Has not had any lows since last visit.   Lab Results  Component Value Date   HGBA1C 5.1 08/22/2016   HGBA1C 5.3 05/17/2016   HGBA1C 5.1 12/21/2015   A/P: update a1c before next visit- appears well controlled. Lost 6 lbs- praised progress.   Hyperlipidemia S: well controlled on simvastaitn 20mg  with LDL <70. No myalgias.  Lab Results  Component Value Date   CHOL 184 05/17/2016   HDL 67.60 05/17/2016   LDLCALC 53 08/22/2016   LDLDIRECT 158.6 02/26/2014   TRIG 61.0 05/17/2016   CHOLHDL 3 05/17/2016   A/P: will look for labs in my inbasket to see if had LFTs- if not ok to hold until next visit as generally low risk on statin and no longer on alcohol.    Husband out of town caring for mother- hard on her- no SI.  Return in about 4 months (around 12/30/2016) for follow up- or sooner if needed.  Return precautions advised.  [name], MD

## 2016-08-31 ENCOUNTER — Encounter: Payer: Self-pay | Admitting: Family Medicine

## 2016-09-01 ENCOUNTER — Other Ambulatory Visit: Payer: Self-pay

## 2016-09-01 DIAGNOSIS — R300 Dysuria: Secondary | ICD-10-CM

## 2016-09-01 NOTE — Addendum Note (Signed)
Addended by: Elmer Picker on: 09/01/2016 03:39 PM   Modules accepted: Orders

## 2016-09-03 LAB — URINE CULTURE

## 2016-09-05 ENCOUNTER — Other Ambulatory Visit: Payer: Self-pay

## 2016-09-05 ENCOUNTER — Encounter: Payer: Self-pay | Admitting: Family Medicine

## 2016-09-05 MED ORDER — CEPHALEXIN 500 MG PO CAPS: 500.0000 mg | ORAL_CAPSULE | Freq: Three times a day (TID) | ORAL | 0 refills | Status: DC

## 2016-09-07 ENCOUNTER — Ambulatory Visit (INDEPENDENT_AMBULATORY_CARE_PROVIDER_SITE_OTHER): Payer: No Typology Code available for payment source | Admitting: Neurology

## 2016-09-07 ENCOUNTER — Encounter: Payer: Self-pay | Admitting: Neurology

## 2016-09-07 VITALS — BP 122/71 | HR 78 | Ht 66.75 in | Wt 257.6 lb

## 2016-09-07 DIAGNOSIS — R0683 Snoring: Secondary | ICD-10-CM

## 2016-09-07 DIAGNOSIS — G2581 Restless legs syndrome: Secondary | ICD-10-CM

## 2016-09-07 DIAGNOSIS — G4719 Other hypersomnia: Secondary | ICD-10-CM | POA: Insufficient documentation

## 2016-09-07 DIAGNOSIS — R51 Headache: Secondary | ICD-10-CM | POA: Diagnosis not present

## 2016-09-07 DIAGNOSIS — F515 Nightmare disorder: Secondary | ICD-10-CM

## 2016-09-07 DIAGNOSIS — R519 Headache, unspecified: Secondary | ICD-10-CM | POA: Insufficient documentation

## 2016-09-07 NOTE — Patient Instructions (Signed)

## 2016-09-07 NOTE — Progress Notes (Signed)
[facility]   Dr. Larey Seat, MD      [place]   Provider:  Larey Seat, M D  Primary Care Physician:  Marin Olp, MD      Referring Provider: Dr Jaynee Eagles   Chief Complaint  Patient presents with  . Rm 3  . New Patient (Initial Visit)  . Migraine    Pt w/ hx of migraines is here today to establish new neuro provider.    HPI:  Thereasa [name] is a 61 y.o. female , seen here as in a referral  from Dr. Jaynee Eagles, MD  " Dr Cathren Laine note from 09-07-2016 : CC:  Migraines   HPI:  [name] [name] is a 61 y.o. female here as a referral from Dr. Yong Channel for Migraines. They started in high school. Her best friend is Katheren Puller who recommended [place]. She started getting the headaches again in 2016, gradually worsened to daily headaches, she would wake up and go to sleep with it. She has tried gabapentin. She has tried lidocaine nasal spray. Is on Keppra now. Headaches start in the temples, radiates around to the back of the head, sometimes can be frontal, sometimes it is sharp, she has light sensitivity, ice packs help, covering eyes helps. 3-4 times she has had severe headache and stayed in bed, had nausea once. She is taking Keppra 2574m nightly. She still has frequent headaches. In the last month she has daily headaches. She feels the Keppra helps. The headaches can last up to 24 hours. She is tired all day. Wake up with headaches often ".  Tried: gabapentin, lidocaine nasal spray, Flurbiprofen, Phenergan, Keppra,   I have the pleasure of seeing [name] [name] today on 09/07/2016. As Dr. AJaynee Eagleshad described the patient had tried multiple medications and still remains suffering from frequent headaches. Headaches may last a whole day, she feels tired all day and she wakes up with headaches. For this reason Dr. AJaynee Eaglessuggested a sleep study for which I will evaluate the patient.   Sleep habits are as follows:bedtime is anytime between 11 PM and 4 AM. She  describes her bedroom is cool, quiet and dark, but she has often fallen asleep in the recliner in the living room watching TV. And sometimes she sleeps through the night without transferring to her bedroom. If she sleeps in her bedroom she sleeps on one pillow with another pillow for body support. Her husband shares the bedroom with her, there are also 2 yorkies in bed with them.  She prefers lateral sleep position, she takes trazodone and usually sleeps about 8 hours. She is retired and wakes up but she has an appointment but she does not have a routine morning rise time. She reports vivid dreams, remembers most in AM. Sometimes she can wake up from them, sometimes they continue after she goes back to sleep. Usually she is being chased , with a knife.  She has 2 bathroom breaks each night, she usually goes to bed with headaches and wakes up with headaches. Headaches have not woken her out of sleep. When she wakes up in the morning she feels tired, not restored not refreshed. She does have dry mouth. She has a dry mouth all the time which may be medication related. She has been taking trazodone since March 2016. Prior she took Unisom or benadryl over-the-counter and  Melatonin with SSRI since 1992.  Sleep medical history and family sleep history: the patient was adopted at age [age]after  one year in orphanage, she is unaware of any childhood sleep disorder such as sleepwalking, night terrors, enuresis. Her parents had alcoholism as did the maternal biological grandmother.   Social history: recovering alcoholic.  Non smoker - from age 68 to 35 ,quit after 18 years. ETOH abuse- rehab at age 61. Relapse after 18 month, quit again in 2016.   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 This has affected her sleep habits. The patient retired in 2005 at the age of 57.   Review of Systems: Out of a complete 14 system review, the patient complains of only the  following symptoms, and all other reviewed systems are negative. Vivid dreams, insomnia, shift work induced circadian rhythm disorder. Epworth score 14, Fatigue severity score n/a   , depression score n/a    Social History   Social History  . Marital status: Married    Spouse name: N/A  . Number of children: N/A  . Years of education: N/A   Occupational History  . Not on file.   Social History Main Topics  . Smoking status: Former Smoker    Packs/day: 1.50    Years: 30.00    Types: Cigarettes    Quit date: 02/21/2009  . Smokeless tobacco: Never Used  . Alcohol use 14.4 oz/week    10 Glasses of wine, 14 Cans of beer per week     Comment: quit January 2015 started in august 2015 , quit again 3-26- 2016, past hx ETOH abuse  . Drug use: No     Comment: marijuana in past  . Sexual activity: Yes   Other Topics Concern  . Not on file   Social History Narrative   Married. 1 daughter. No grandkids. 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       Hobbies: shopping, travel    Family History  Problem Relation Age of Onset  . Adopted: Yes  . COPD Mother   . Alcohol abuse Father     Past Medical History:  Diagnosis Date  . Acute meniscal tear of knee    Left knee  . Alcohol problem drinking    rehab  . Anemia    menstrual related  . Anxiety   . Arthritis    right ankle-neuropathy  . Colon polyps   . Depression   . DM (diabetes mellitus), type 2 ([place]) 12/2009   type 2  . Evalluate for OSA (obstructive sleep apnea) 08/15/2013   No OSA on sleep study but may be underestimated.-never used cpap    . GERD (gastroesophageal reflux disease)    not currently taking.  Marland Kitchen Heart murmur   . History of recurrent UTIs   . Hyperlipidemia   . Hypertension   . Hypothyroidism   . Liver disease   . Neuromuscular disorder ([place])    neropathy bilateral feet.  . 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 Shortness of  breath    with exertion   . Thyroid disease     Past Surgical History:  Procedure Laterality Date  . ADENOIDECTOMY    . BREAST SURGERY Right    lumpectomy-benign  . CESAREAN SECTION  1987  . CHOLECYSTECTOMY    . MASS EXCISION Left 12/16/2013   Procedure: EXCISION LEFT  DISTAL THIGH MASS;  Surgeon: Mauri Pole, MD;  Location: [facility];  Service: Orthopedics;  Laterality: Left;  . right ankle surgery      x 2- no retained hardware  . right rotator cuff surgery     . TONSILLECTOMY    . TOTAL KNEE ARTHROPLASTY  2009   right  . TOTAL KNEE ARTHROPLASTY Left 05/05/2015   Procedure: LEFT TOTAL KNEE ARTHROPLASTY;  Surgeon: Paralee Cancel, MD;  Location: [facility];  Service: Orthopedics;  Laterality: Left;  . TOTAL KNEE REVISION Right 12/16/2013   Procedure: RIGHT TOTAL KNEE REVISION POLY EXCHANGE;  Surgeon: Mauri Pole, MD;  Location: [facility];  Service: Orthopedics;  Laterality: Right;  . TUBAL LIGATION  1987    Current Outpatient Prescriptions  Medication Sig Dispense Refill  . ACCU-CHEK FASTCLIX LANCETS MISC Use to test blood sugars daily. Dx: E11.9 100 each 11  . buPROPion (WELLBUTRIN XL) 150 MG 24 hr tablet Take 1 tablet (150 mg total) by mouth daily. 90 tablet 3  . cephALEXin (KEFLEX) 500 MG capsule Take 1 capsule (500 mg total) by mouth 3 (three) times daily. 21 capsule 0  . escitalopram (LEXAPRO) 20 MG tablet TAKE 1 TABLET BY MOUTH EVERY DAY 90 tablet 1  . flurbiprofen (ANSAID) 100 MG tablet Take 100 mg by mouth 2 (two) times daily as needed.    Marland Kitchen glimepiride (AMARYL) 2 MG tablet TAKE 1 TABLET (2 MG TOTAL) BY MOUTH DAILY WITH BREAKFAST. 90 tablet 3  . glucose blood (ONETOUCH VERIO) test strip USE TO CHECK BLOOD SUGAR DAILY AND PRN 100 each 12  . JANUMET XR 50-1000 MG TB24 TAKE 2 TABLETS BY MOUTH DAILY. 180 tablet 3  . levETIRAcetam (KEPPRA XR) 500 MG 24 hr tablet Increase by 500 mg every 2 weeks as needed and as tolerated up to 3000 mg at bedtime    . levothyroxine (SYNTHROID,  LEVOTHROID) 88 MCG tablet TAKE 1 TABLET (88 MCG TOTAL) BY MOUTH DAILY. 90 tablet 3  . PRESCRIPTION MEDICATION Lidocaine 4% Nasal Spray as needed    . promethazine (PHENERGAN) 25 MG tablet Take 25 mg by mouth as needed for nausea or vomiting.    . simvastatin (ZOCOR) 20 MG tablet Take 1 tablet (20 mg total) by mouth at bedtime. 90 tablet 3  . traZODone (DESYREL) 50 MG tablet TAKE 1 TABLET AT BEDTIME AS NEEDED,MAY REPEAT 1 TIME IF NEEDED FOR SLEEP 90 tablet 3   No current facility-administered medications for this visit.     Allergies as of 09/07/2016 - Review Complete 09/07/2016  Allergen Reaction Noted  . Adhesive [tape] Other (See Comments) 12/10/2012  . Nitrofurantoin  01/07/2016  . Other Other (See Comments) 12/12/2013    Vitals: BP 122/71   Pulse 78   Ht 5' 6.75" (1.695 m)   Wt 257 lb 9.6 oz (116.8 kg)   BMI 40.65 kg/m  Last Weight:  Wt Readings from Last 1 Encounters:  09/07/16 257 lb 9.6 oz (116.8 kg)   DGU:YQIH mass index is 40.65 kg/m.     Last Height:   Ht Readings from Last 1 Encounters:  09/07/16 5' 6.75" (1.695 m)    Physical exam:  General: The patient is awake, alert and appears not in acute distress. The patient is well groomed. Head: Normocephalic, atraumatic. Neck is supple. Mallampati 3,  neck circumference: 15.5 . Nasal airflow patent , TMJ is not evident. Retrognathia is not seen. Crowned teeth.  Cardiovascular:  Regular rate and rhythm, without  murmurs or carotid bruit, and without distended neck veins. Respiratory: Lungs are clear to auscultation. Skin:  Without evidence of edema, or rash. Trunk: BMI is 41 .  Neurologic exam : The patient is awake and alert, oriented to place and time.   Speech is fluent,  without  dysarthria, dysphonia or aphasia. Mood and affect are appropriate.  Cranial nerves:Pupils are equal and briskly reactive to light. Funduscopic exam without evidence of pallor or edema. Extraocular movements  in vertical and horizontal  planes intact and without nystagmus. Visual fields by finger perimetry are intact.Hearing to finger rub intact. Facial sensation intact to fine touch. Facial motor strength is symmetric and tongue and uvula move midline. Shoulder shrug was symmetrical.  Motor exam:   Normal tone, muscle bulk and symmetric strength in all extremities. Sensory:  Fine touch, pinprick and vibration were tested in all extremities. Proprioception tested in the upper extremities was normal. Coordination: Rapid alternating movements in the fingers/hands was normal. Finger-to-nose maneuver  normal without evidence of ataxia, dysmetria or tremor. Gait and station: Patient walks without assistive device and is able unassisted to climb up to the exam table. Strength within normal limits. Stance is stable and normal.  Tandem gait is unfragmented.  Deep tendon reflexes: in the  upper and lower extremities are symmetric and intact. Status post knee replacement . Babinski maneuver response is downgoing.  Assessment:  After physical and neurologic examination, review of laboratory studies,  Personal review of imaging studies, reports of other /same  Imaging studies, results of polysomnography and / or neurophysiology testing and pre-existing records as far as provided in visit., my assessment is   1) Mrs Dannemiller reports very vivid dreams and and sometimes his stories or content of her dreams would be picked up in the next sleep episode. She does not report that she can control her dreams but she can often remember her dreams of being chased or being threatened with a knife. In conjunction with her high degree of daily sleepiness these are symptoms that are associated with narcolepsy. Since she is treated with antidepressants and MS LT could not be validly performed. I suggested an HLA narcolepsy panel test (by blood).  2) Mrs. Ovitt nose that she is a loud snorer, she wakes up with a dry mouth and she has frequent headaches. In addition  she has a very high body mass index of 41 placing her in the category of morbid obesity. She has multiple risk factors for sleep apnea. She may have hypoventilation, CO2 retention and hypoxemia may contribute to her sleep related headaches.   3)excessive daytime sleepiness can be a manifestation of both an underlying sleep-disordered breathing condition as well as narcolepsy. In addition there is a possibility of shift work sleep disorder, and entrainment abnormal rhythm of the circadian rhythm after years of night shift work.  The patient was advised of the nature of the diagnosed disorder , the treatment options and the  risks for general health and wellness arising from not treating the condition.   I spent more than 45  minutes of face to face time with the patient.  Greater than 50% of time was spent in counseling and coordination of care. We have discussed the diagnosis and differential and I answered the patient's questions.    Plan:  Treatment plan and additional workup :  I will order a parasomnia Polysomnography for this patient, and I will draw an HLA narcolepsy test today.  If the PSG gives evidence of apnea I will either have the patient return for CPAP titration or treat her with it also titrated.  I will asked the technologist to use capnography during her Polysomnography.    Larey Seat, MD 9/76/7341, 9:37 PM  Certified in Neurology by ABPN Certified in [place] by [facility] [address] [place], [place] [address]

## 2016-09-09 ENCOUNTER — Encounter: Payer: Self-pay | Admitting: Neurology

## 2016-09-14 ENCOUNTER — Ambulatory Visit: Payer: No Typology Code available for payment source | Attending: Orthopedic Surgery

## 2016-09-14 ENCOUNTER — Telehealth: Payer: Self-pay | Admitting: Neurology

## 2016-09-14 DIAGNOSIS — M25572 Pain in left ankle and joints of left foot: Secondary | ICD-10-CM | POA: Insufficient documentation

## 2016-09-14 DIAGNOSIS — R2689 Other abnormalities of gait and mobility: Secondary | ICD-10-CM | POA: Insufficient documentation

## 2016-09-14 DIAGNOSIS — M6281 Muscle weakness (generalized): Secondary | ICD-10-CM | POA: Diagnosis present

## 2016-09-14 NOTE — Telephone Encounter (Signed)
All of [place] providers have the same coverage !

## 2016-09-14 NOTE — Patient Instructions (Signed)
SEATED Gastroc / Heel Cord Stretch - Seated With Towel   Sit on floor, towel around ball of foot. Gently pull foot in toward body, stretching heel cord and calf. Hold for _20__ seconds. Repeat on involved leg. Repeat __3_ times. Do _3__ times per day.     Achilles / Gastroc, Standing   Stand, right foot behind, heel on floor and turned slightly out, leg straight, forward leg bent. Move hips forward. Hold _20__ seconds. Repeat _3__ times per session. Do _3__ sessions per day.   Stretching: Soleus   Stand with right foot back, both knees bent. Keeping heel on floor, turned slightly out, lean into wall until stretch is felt in lower calf. Hold _20___ seconds. Repeat __3__ times per set. Do __3__ sessions per day.    Gastroc / Heel Cord Stretch - On Step   Stand with heels over edge of stair. Holding rail, lower heels until stretch is felt in calf of legs. Hold 20 secs.  Repeat __3_ times. Do __3_ times per day.  Copyright  VHI. All rights reserved.   Single Leg Balance: Eyes Open    Stand on right leg with eyes open. Hold _10-15__ seconds. _5__ reps _3__ times per day.  http://ggbe.exer.us/5   Copyright  VHI. All rights reserved.  [facility] [address] [place], [place] [address] Phone # [phone] Fax [phone]

## 2016-09-14 NOTE — Telephone Encounter (Signed)
Patient called office in reference to switching to seeing Dr. Rexene Alberts vs. Dr. Brett Fairy due to insurance not having Dr. Brett Fairy listed as a provider.  Please call if patient is not home per patient please leave detailed message on home answering machine.

## 2016-09-14 NOTE — Therapy (Signed)
[facility] [address] [place], [place], [address] Phone: [phone]   Fax:  [phone]  Physical Therapy Evaluation  Patient Details  Name: [name] [name] MRN: [id number] Date of Birth: [birth year] Referring Provider: Mechele Claude, [facility]  Encounter Date: 09/14/2016      PT End of Session - 09/14/16 1613    Visit Number 1   Date for PT Re-Evaluation 11/09/16   PT Start Time 1536   PT Stop Time 3664   PT Time Calculation (min) 38 min   Activity Tolerance Patient tolerated treatment well   Behavior During Therapy [facility] for tasks assessed/performed      Past Medical History:  Diagnosis Date  . Acute meniscal tear of knee    Left knee  . Alcohol problem drinking    rehab  . Anemia    menstrual related  . Anxiety   . Arthritis    right ankle-neuropathy  . Colon polyps   . Depression   . DM (diabetes mellitus), type 2 ([name]) 12/2009   type 2  . Evalluate for OSA (obstructive sleep apnea) 08/15/2013   No OSA on sleep study but may be underestimated.-never used cpap    . GERD (gastroesophageal reflux disease)    not currently taking.  Marland Kitchen Heart murmur   . History of recurrent UTIs   . Hyperlipidemia   . Hypertension   . Hypothyroidism   . Liver disease   . Neuromuscular disorder ([place])    neropathy bilateral feet.  . 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 Shortness of breath    with exertion   . Thyroid disease     Past Surgical History:  Procedure Laterality Date  . ADENOIDECTOMY    . BREAST SURGERY Right    lumpectomy-benign  . CESAREAN SECTION  1987  . CHOLECYSTECTOMY    . MASS EXCISION Left 12/16/2013   Procedure: EXCISION LEFT  DISTAL THIGH MASS;  Surgeon: Mauri Pole, MD;  Location: [facility];  Service: Orthopedics;  Laterality: Left;  . right ankle surgery      x 2- no retained hardware  . right rotator cuff surgery     . TONSILLECTOMY    . TOTAL KNEE  ARTHROPLASTY  2009   right  . TOTAL KNEE ARTHROPLASTY Left 05/05/2015   Procedure: LEFT TOTAL KNEE ARTHROPLASTY;  Surgeon: Paralee Cancel, MD;  Location: [facility];  Service: Orthopedics;  Laterality: Left;  . TOTAL KNEE REVISION Right 12/16/2013   Procedure: RIGHT TOTAL KNEE REVISION POLY EXCHANGE;  Surgeon: Mauri Pole, MD;  Location: [facility];  Service: Orthopedics;  Laterality: Right;  . TUBAL LIGATION  1987    There were no vitals filed for this visit.       Subjective Assessment - 09/14/16 1540    Subjective Pt presents to PT with complaints of Lt ankle pain that began 02/2016.  Pt had surgery for Rt ankle ligament repair 01/2016 and feels that compensation after this surgery might have started the Lt ankle pain.,     Pertinent History Rt ankle ligament repair surgery 02/04/16.    Limitations Standing;Walking   How long can you stand comfortably? 1-2 hours but pain   How long can you walk comfortably? 1-2 hours but pain   Diagnostic tests x-ray: inflammation   Patient Stated Goals normalize gait, reduce pain, improve strength and mobility of the ankle   Currently in Pain? Yes   Pain Score  6    Pain Location Ankle  0-9/10   Pain Orientation Left   Pain Descriptors / Indicators Aching;Burning;Sore;Sharp   Pain Type Chronic pain   Pain Onset More than a month ago   Pain Frequency Intermittent   Aggravating Factors  walking on hard surfaces, standing, weight of covers in bed   Pain Relieving Factors rest, ice, positioning            [facility] PT Assessment - 09/14/16 0001      Assessment   Medical Diagnosis Lt posterior tibialis tendonitis   Referring Provider Mechele Claude, [facility]   Onset Date/Surgical Date 02/22/16     Precautions   Precautions None     Restrictions   Weight Bearing Restrictions No     Balance Screen   Has the patient fallen in the past 6 months No   Has the patient had a decrease in activity level because of a fear of falling?  No   Is the patient  reluctant to leave their home because of a fear of falling?  No     Home Environment   Living Environment Private residence   Living Arrangements Spouse/significant other   Type of [place] Access Level entry   Home Layout One level     Prior Function   Level of Independence Independent   Vocation Unemployed   Leisure crafts     Cognition   Overall Cognitive Status Within Functional Limits for tasks assessed     Observation/Other Assessments   Focus on Therapeutic Outcomes (FOTO)  62% limitation     Posture/Postural Control   Posture/Postural Control No significant limitations     ROM / Strength   AROM / PROM / Strength AROM;PROM;Strength     AROM   Overall AROM Comments Rt ankle A/ROM is full. Lt ankle A/ROM is full with Lt medial and lateral ankle pain at end range.       Strength   Overall Strength Deficits   Overall Strength Comments Lt ankle inversion/eversion 4/5, DF 4+/5.     Palpation   Patella mobility single leg stance: Rt=Lt with ankle instabilty x 15 seconds   Palpation comment mild edema about the Lt ankle joint.  palpable tendrenss over Lt medial malleolus and arch.     Ambulation/Gait   Ambulation/Gait Yes   Ambulation/Gait Assistance 6: Modified independent (Device/Increase time)   Gait Pattern Step-through pattern;Decreased stance time - left;Decreased dorsiflexion - left  Lt foot inversion with gait            Objective measurements completed on examination: See above findings.                  PT Education - 09/14/16 1601    Education provided Yes   Education Details gastroc and soleus stretch, single leg stance   Person(s) Educated Patient   Methods Explanation;Demonstration;Handout   Comprehension Verbalized understanding;Returned demonstration          PT Short Term Goals - 09/14/16 1535      PT SHORT TERM GOAL #1   Title be independent in initial HEP   Time 4   Period Weeks   Status New     PT SHORT  TERM GOAL #2   Title report < or = to 6/10 Lt ankle pain with standing and walking   Time 4   Period Weeks   Status New     PT SHORT TERM GOAL #3   Title ___  PT SHORT TERM GOAL #4   Title ___           PT Long Term Goals - 09/14/16 1535      PT LONG TERM GOAL #1   Title be independent in advanced HEP   Time 8   Period Weeks   Status New     PT LONG TERM GOAL #2   Title reduce FOTO to < or = to 47% limitation   Baseline --   Time 8   Period Weeks   Status New     PT LONG TERM GOAL #3   Title report < or = to 4/10 Lt ankle pain with standing and walking   Time 8   Period Weeks   Status New     PT LONG TERM GOAL #4   Title demonstrate symmetry with gait on level surfaces   Time 8   Period Weeks   Status New     PT LONG TERM GOAL #5   Title demonstrate 4+/5 Lt ankle inversion and eversion to improve stability in the community   Time 8   Period Weeks   Status New     PT LONG TERM GOAL #6   Title __                Plan - 09/14/16 1622    Clinical Impression Statement Pt presents to PT with Lt ankle pain that began 02/2016 after having Rt ankle surgery.  Pt had x-ray last week and this was negative.  Pt demonstrates gait assymetry, decreased Lt ankle inversion/eversion and pain over Lt medial ankle and arch with palpation.  Pt will benefit from skilled PT for Lt ankle strength, flexibility, manual therapy, iontophoresis, gait and proprioception.     History and Personal Factors relevant to plan of care: Rt ankle surgery 01/2016, knee replacement   Clinical Presentation Evolving   Clinical Presentation due to: worsening over the past few months, now antalgic gait   Clinical Decision Making Moderate   Rehab Potential Good   PT Frequency 2x / week   PT Duration 8 weeks   PT Treatment/Interventions ADLs/Self Care Home Management;Cryotherapy;Electrical Stimulation;Functional mobility training;Stair training;Gait training;Ultrasound;Moist Heat;Therapeutic  activities;Therapeutic exercise;Neuromuscular re-education;Patient/family education;Passive range of motion;Scar mobilization;Manual techniques;Taping;Vasopneumatic Device;Dry needling;Iontophoresis 4mg /ml Dexamethasone   PT Next Visit Plan ionto if MD order signed, Lt ankle flexibility, gait, manual    Consulted and Agree with Plan of Care Patient      Patient will benefit from skilled therapeutic intervention in order to improve the following deficits and impairments:  Abnormal gait, Difficulty walking, Pain, Increased edema, Decreased strength, Decreased balance, Impaired flexibility, Decreased activity tolerance  Visit Diagnosis: Other abnormalities of gait and mobility - Plan: PT plan of care cert/re-cert  Pain in left ankle and joints of left foot - Plan: PT plan of care cert/re-cert  Muscle weakness (generalized) - Plan: PT plan of care cert/re-cert     Problem List Patient Active Problem List   Diagnosis Date Noted  . Sleep related headaches 09/07/2016  . Nightmares REM-sleep type 09/07/2016  . RLS (restless legs syndrome) 09/07/2016  . Snoring 09/07/2016  . Excessive daytime sleepiness 09/07/2016  . Migraine 07/28/2016  . Solitary pulmonary nodule 06/08/2016  . S/P left TKA 05/05/2015  . S/P knee replacement 05/05/2015  . Former smoker 07/10/2014  . Exertional shortness of breath 06/07/2014  . Chest pain 06/07/2014  . Osteoarthritis 05/16/2014  . Alcoholic fatty liver 87/86/7672  . Chronic post-traumatic stress disorder (PTSD) 05/02/2014  .  Sleep disorder 05/02/2014  . Family history of alcoholism 05/02/2014  . Macrocytosis 04/19/2014  . Fatigue 08/15/2013  . Thyroid nodule 09/17/2012  . Hypothyroid 07/21/2010  . Type II diabetes mellitus, well controlled ([place]) 06/23/2010  . HTN (hypertension) 06/23/2010  . Alcohol use disorder, severe, dependence ([place]) 06/23/2010  . Hyperlipidemia 06/23/2010  . Morbid obesity ([place]) 06/23/2010  . Chronic depression 06/23/2010   . History of colonic polyps 04/12/2010    Sigurd Sos, PT 09/14/16 5:10 PM  [facility] [address] [place], [place], [address] Phone: [phone]   Fax:  [phone]  Name: [name] [name] MRN: [id number] Date of Birth: 1955-10-09

## 2016-09-14 NOTE — Telephone Encounter (Signed)
No need to switch, Dr. Keturah Barre and I are in the same practice.

## 2016-09-15 ENCOUNTER — Telehealth: Payer: Self-pay | Admitting: Neurology

## 2016-09-15 NOTE — Telephone Encounter (Signed)
Called pt with results. No answer at home phone. LVM per DPR stating that the test was normal and to continue to move forward with the sleep study as planned as the sleep study is the most diagnostic test to confirm Narcolepsy.Left number for pt to call back if she has any further questions

## 2016-09-19 ENCOUNTER — Other Ambulatory Visit: Payer: Self-pay | Admitting: Neurology

## 2016-09-19 MED ORDER — LEVETIRACETAM ER 500 MG PO TB24: 500.0000 mg | ORAL_TABLET | Freq: Every day | ORAL | 0 refills | Status: DC

## 2016-09-19 NOTE — Telephone Encounter (Signed)
Spoke with pt. I explained that the doctors do not like to take on other doctors work here in the practice. I stated that if her insurance has issues with that they could contact us but her care will need to remain with Dr Dohmeier for now. Pt was requesting to stick with Dr Brett Fairy  For sleep and as a primary neurologist so that she could continue to get her medications. I stated that this would be something we would discuss at a later time once the workup for sleep was complete. Pt was very concerned whether the sleep study would be covered under insurance I assured her that we had someone that would be addressing that prior to the sleep study being completed. Pt requested a refill for Keppra and Dr Brett Fairy approved the request and prescription was sent to the pt's pharmacy

## 2016-09-19 NOTE — Telephone Encounter (Signed)
Patient called office in reference to the switch of providers, she is stating she is wanting to switch from Dr. Jaynee Eagles to Dr. Rexene Alberts.  Please call

## 2016-09-20 ENCOUNTER — Other Ambulatory Visit: Payer: Self-pay | Admitting: Neurology

## 2016-09-20 MED ORDER — LEVETIRACETAM 1000 MG PO TABS: 3000.0000 mg | ORAL_TABLET | Freq: Every day | ORAL | 0 refills | Status: DC

## 2016-09-21 ENCOUNTER — Encounter: Payer: Self-pay | Admitting: Physical Therapy

## 2016-09-21 ENCOUNTER — Telehealth: Payer: Self-pay

## 2016-09-21 ENCOUNTER — Ambulatory Visit: Payer: No Typology Code available for payment source | Attending: Orthopedic Surgery | Admitting: Physical Therapy

## 2016-09-21 DIAGNOSIS — M25571 Pain in right ankle and joints of right foot: Secondary | ICD-10-CM | POA: Insufficient documentation

## 2016-09-21 DIAGNOSIS — R2689 Other abnormalities of gait and mobility: Secondary | ICD-10-CM | POA: Insufficient documentation

## 2016-09-21 DIAGNOSIS — M25671 Stiffness of right ankle, not elsewhere classified: Secondary | ICD-10-CM | POA: Insufficient documentation

## 2016-09-21 DIAGNOSIS — M25572 Pain in left ankle and joints of left foot: Secondary | ICD-10-CM | POA: Diagnosis present

## 2016-09-21 DIAGNOSIS — M6281 Muscle weakness (generalized): Secondary | ICD-10-CM | POA: Insufficient documentation

## 2016-09-21 NOTE — Telephone Encounter (Signed)
Spoke with patient to schedule her sleep study. Insurance stated she isnt in network for sleep study. She will call and find out who is and let us know if she gets information that is different.

## 2016-09-21 NOTE — Patient Instructions (Signed)

## 2016-09-21 NOTE — Therapy (Signed)
[facility] [address], [place], [place], [address] Phone: [phone]   Fax:  [phone]  Physical Therapy Treatment  Patient Details  Name: [name] [name] MRN: [id number] Date of Birth: 07/08/1955 Referring Provider: Mechele Claude, [facility]  Encounter Date: 09/21/2016      PT End of Session - 09/21/16 1151    Visit Number 2   Date for PT Re-Evaluation 11/09/16   PT Start Time 1150   PT Stop Time 1225   PT Time Calculation (min) 35 min   Activity Tolerance Patient tolerated treatment well   Behavior During Therapy [facility] for tasks assessed/performed      Past Medical History:  Diagnosis Date  . Acute meniscal tear of knee    Left knee  . Alcohol problem drinking    rehab  . Anemia    menstrual related  . Anxiety   . Arthritis    right ankle-neuropathy  . Colon polyps   . Depression   . DM (diabetes mellitus), type 2 ([place]) 12/2009   type 2  . Evalluate for OSA (obstructive sleep apnea) 08/15/2013   No OSA on sleep study but may be underestimated.-never used cpap    . GERD (gastroesophageal reflux disease)    not currently taking.  Marland Kitchen Heart murmur   . History of recurrent UTIs   . Hyperlipidemia   . Hypertension   . Hypothyroidism   . Liver disease   . Neuromuscular disorder ([place])    neropathy bilateral feet.  . 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 Shortness of breath    with exertion   . Thyroid disease     Past Surgical History:  Procedure Laterality Date  . ADENOIDECTOMY    . BREAST SURGERY Right    lumpectomy-benign  . CESAREAN SECTION  1987  . CHOLECYSTECTOMY    . MASS EXCISION Left 12/16/2013   Procedure: EXCISION LEFT  DISTAL THIGH MASS;  Surgeon: Mauri Pole, MD;  Location: [facility];  Service: Orthopedics;  Laterality: Left;  . right ankle surgery      x 2- no retained hardware  . right rotator cuff surgery     . TONSILLECTOMY    . TOTAL KNEE  ARTHROPLASTY  2009   right  . TOTAL KNEE ARTHROPLASTY Left 05/05/2015   Procedure: LEFT TOTAL KNEE ARTHROPLASTY;  Surgeon: Paralee Cancel, MD;  Location: [facility];  Service: Orthopedics;  Laterality: Left;  . TOTAL KNEE REVISION Right 12/16/2013   Procedure: RIGHT TOTAL KNEE REVISION POLY EXCHANGE;  Surgeon: Mauri Pole, MD;  [facility];  Service: Orthopedics;  Laterality: Right;  . TUBAL LIGATION  1987    There were no vitals filed for this visit.      Subjective Assessment - 09/21/16 1154    Subjective I feel ok this AM but I havne't done anything yet today.    Pertinent History Rt ankle ligament repair surgery 02/04/16.    Currently in Pain? No/denies   Multiple Pain Sites No                         [facility] Adult PT Treatment/Exercise - 09/21/16 0001      Knee/Hip Exercises: Aerobic   Nustep L1 x 6 min  PTA monitored throughout     Iontophoresis   Type of Iontophoresis Dexamethasone   Location Lt ankle   Dose 1 ml   Time 6  ml  skin intact, pt verbally understands wear time     Ankle Exercises: Stretches   Soleus Stretch 3 reps;20 seconds   Gastroc Stretch 3 reps;20 seconds     Ankle Exercises: Standing   SLS 3x20 sec   Other Standing Ankle Exercises 6 inch step ups 20x   Other Standing Ankle Exercises resisted walking 20# 5x forwad/bkward     Ankle Exercises: Seated   BAPS Sitting;Level 2;15 reps  ball bt knees                PT Education - 09/21/16 1200    Education provided Yes   Education Details ionto patch information   Person(s) Educated Patient   Methods Explanation;Handout   Comprehension Verbalized understanding;Returned demonstration          PT Short Term Goals - 09/14/16 1535      PT SHORT TERM GOAL #1   Title be independent in initial HEP   Time 4   Period Weeks   Status New     PT SHORT TERM GOAL #2   Title report < or = to 6/10 Lt ankle pain with standing and walking   Time 4   Period Weeks   Status  New     PT SHORT TERM GOAL #3   Title ___     PT SHORT TERM GOAL #4   Title ___           PT Long Term Goals - 09/14/16 1535      PT LONG TERM GOAL #1   Title be independent in advanced HEP   Time 8   Period Weeks   Status New     PT LONG TERM GOAL #2   Title reduce FOTO to < or = to 47% limitation   Baseline --   Time 8   Period Weeks   Status New     PT LONG TERM GOAL #3   Title report < or = to 4/10 Lt ankle pain with standing and walking   Time 8   Period Weeks   Status New     PT LONG TERM GOAL #4   Title demonstrate symmetry with gait on level surfaces   Time 8   Period Weeks   Status New     PT LONG TERM GOAL #5   Title demonstrate 4+/5 Lt ankle inversion and eversion to improve stability in the community   Time 8   Period Weeks   Status New     PT LONG TERM GOAL #6   Title __               Plan - 09/21/16 1152    Clinical Impression Statement Pt presented today without ankle pain and carried this throughout her session. Pt also demonstrated fairly good balance and stability with her exercises. Began ionto patches today for possible tendonitis pain.    PT Frequency 2x / week   PT Duration 8 weeks   PT Treatment/Interventions ADLs/Self Care Home Management;Cryotherapy;Electrical Stimulation;Functional mobility training;Stair training;Gait training;Ultrasound;Moist Heat;Therapeutic activities;Therapeutic exercise;Neuromuscular re-education;Patient/family education;Passive range of motion;Scar mobilization;Manual techniques;Taping;Vasopneumatic Device;Dry needling;Iontophoresis 4mg /ml Dexamethasone   PT Next Visit Plan [place] #2, ankle flexibility and stability exercises   Consulted and Agree with Plan of Care Patient      Patient will benefit from skilled therapeutic intervention in order to improve the following deficits and impairments:  Abnormal gait, Difficulty walking, Pain, Increased edema, Decreased strength, Decreased balance, Impaired  flexibility, Decreased activity tolerance  Visit Diagnosis: Other abnormalities of gait and mobility  Pain in left ankle and joints of left foot  Muscle weakness (generalized)  Pain in right ankle and joints of right foot     Problem List Patient Active Problem List   Diagnosis Date Noted  . Sleep related headaches 09/07/2016  . Nightmares REM-sleep type 09/07/2016  . RLS (restless legs syndrome) 09/07/2016  . Snoring 09/07/2016  . Excessive daytime sleepiness 09/07/2016  . Migraine 07/28/2016  . Solitary pulmonary nodule 06/08/2016  . S/P left TKA 05/05/2015  . S/P knee replacement 05/05/2015  . Former smoker 07/10/2014  . Exertional shortness of breath 06/07/2014  . Chest pain 06/07/2014  . Osteoarthritis 05/16/2014  . Alcoholic fatty liver 26/20/3559  . Chronic post-traumatic stress disorder (PTSD) 05/02/2014  . Sleep disorder 05/02/2014  . Family history of alcoholism 05/02/2014  . Macrocytosis 04/19/2014  . Fatigue 08/15/2013  . Thyroid nodule 09/17/2012  . Hypothyroid 07/21/2010  . Type II diabetes mellitus, well controlled ([place]) 06/23/2010  . HTN (hypertension) 06/23/2010  . Alcohol use disorder, severe, dependence ([place]) 06/23/2010  . Hyperlipidemia 06/23/2010  . Morbid obesity ([place]) 06/23/2010  . Chronic depression 06/23/2010  . History of colonic polyps 04/12/2010    Manvir Thorson, PTA 09/21/2016, 12:27 PM  [facility] [address], [place], [place], [address] Phone: [phone]   Fax:  [phone]  Name: [name] [name] MRN: [id number] Date of Birth: April 28, 1955

## 2016-09-28 ENCOUNTER — Ambulatory Visit: Payer: No Typology Code available for payment source | Admitting: Physical Therapy

## 2016-09-28 ENCOUNTER — Encounter: Payer: Self-pay | Admitting: Physical Therapy

## 2016-09-28 DIAGNOSIS — M6281 Muscle weakness (generalized): Secondary | ICD-10-CM

## 2016-09-28 DIAGNOSIS — M25572 Pain in left ankle and joints of left foot: Secondary | ICD-10-CM

## 2016-09-28 DIAGNOSIS — R2689 Other abnormalities of gait and mobility: Secondary | ICD-10-CM | POA: Diagnosis not present

## 2016-09-28 NOTE — Therapy (Signed)
[facility] [address], [place], [place], [address] Phone: [phone]   Fax:  [phone]  Physical Therapy Treatment  Patient Details  Name: [name] [name] MRN: [id number] Date of Birth: 02-Nov-1955 Referring Provider: Mechele Claude, [facility]  Encounter Date: 09/28/2016      PT End of Session - 09/28/16 1857    Visit Number 3   Date for PT Re-Evaluation 11/09/16   PT Start Time 1623   PT Stop Time 1701   PT Time Calculation (min) 38 min   Activity Tolerance Patient tolerated treatment well   Behavior During Therapy [facility] for tasks assessed/performed      Past Medical History:  Diagnosis Date  . Acute meniscal tear of knee    Left knee  . Alcohol problem drinking    rehab  . Anemia    menstrual related  . Anxiety   . Arthritis    right ankle-neuropathy  . Colon polyps   . Depression   . DM (diabetes mellitus), type 2 ([place]) 12/2009   type 2  . Evalluate for OSA (obstructive sleep apnea) 08/15/2013   No OSA on sleep study but may be underestimated.-never used cpap    . GERD (gastroesophageal reflux disease)    not currently taking.  Marland Kitchen Heart murmur   . History of recurrent UTIs   . Hyperlipidemia   . Hypertension   . Hypothyroidism   . Liver disease   . Neuromuscular disorder ([place])    neropathy bilateral feet.  . 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 Shortness of breath    with exertion   . Thyroid disease     Past Surgical History:  Procedure Laterality Date  . ADENOIDECTOMY    . BREAST SURGERY Right    lumpectomy-benign  . CESAREAN SECTION  1987  . CHOLECYSTECTOMY    . MASS EXCISION Left 12/16/2013   Procedure: EXCISION LEFT  DISTAL THIGH MASS;  Surgeon: Mauri Pole, MD;  Location: [facility];  Service: Orthopedics;  Laterality: Left;  . right ankle surgery      x 2- no retained hardware  . right rotator cuff surgery     . TONSILLECTOMY    . TOTAL KNEE  ARTHROPLASTY  2009   right  . TOTAL KNEE ARTHROPLASTY Left 05/05/2015   Procedure: LEFT TOTAL KNEE ARTHROPLASTY;  Surgeon: Paralee Cancel, MD;  [facility]: [facility];  Service: Orthopedics;  Laterality: Left;  . TOTAL KNEE REVISION Right 12/16/2013   Procedure: RIGHT TOTAL KNEE REVISION POLY EXCHANGE;  Surgeon: Mauri Pole, MD;  [facility];  Service: Orthopedics;  Laterality: Right;  . TUBAL LIGATION  1987    There were no vitals filed for this visit.      Subjective Assessment - 09/28/16 1856    Subjective Computer network is down. Pt signed paper copy of AOB and was taken back for session at 16:23. Pt reports she was pain free for next 24-36 hours after last session. The ionto patch really helped.   Currently in Pain? No/denies   Multiple Pain Sites No                         [facility] Adult PT Treatment/Exercise - 09/28/16 0001      Knee/Hip Exercises: Stretches   Gastroc Stretch Left;3 reps;30 seconds     Knee/Hip Exercises: Aerobic   Nustep L2 x 10 min  review of  pt status     Knee/Hip Exercises: Standing   Heel Raises Both;2 sets;10 reps   Forward Step Up Left;2 sets;10 reps;Hand Hold: 1;Step Height: 6"     Iontophoresis   Type of Iontophoresis Dexamethasone  #2 skin intact   Location Lt ankle   Dose 1 ml   Time 6 hr wear     Ankle Exercises: Seated   BAPS Sitting;Level 3;Other (comment)  2 min each direction     Ankle Exercises: Standing   SLS 3 x20 sec on blue pod  1 balance pod required   Other Standing Ankle Exercises not available                  PT Short Term Goals - 09/28/16 1859      PT SHORT TERM GOAL #1   Title be independent in initial HEP   Time 4   Period Weeks   Status Achieved     PT SHORT TERM GOAL #2   Title report < or = to 6/10 Lt ankle pain with standing and walking   Time 4   Period Weeks   Status On-going  improving, no pain 24-36 hrs, then some pain 5/10           PT Long Term Goals - 09/14/16  1535      PT LONG TERM GOAL #1   Title be independent in advanced HEP   Time 8   Period Weeks   Status New     PT LONG TERM GOAL #2   Title reduce FOTO to < or = to 47% limitation   Baseline --   Time 8   Period Weeks   Status New     PT LONG TERM GOAL #3   Title report < or = to 4/10 Lt ankle pain with standing and walking   Time 8   Period Weeks   Status New     PT LONG TERM GOAL #4   Title demonstrate symmetry with gait on level surfaces   Time 8   Period Weeks   Status New     PT LONG TERM GOAL #5   Title demonstrate 4+/5 Lt ankle inversion and eversion to improve stability in the community   Time 8   Period Weeks   Status New     PT LONG TERM GOAL #6   Title __               Plan - 09/28/16 1858    Clinical Impression Statement Pt presented today with no pain. She reports after the first session and specifically the ionto patch she did not have pain for 24-36 hours.  The standing single leg balance work on the blue pod was very challenging for pt visually.  Met short term goal #1 and making progress towards #2.    Rehab Potential Good   PT Frequency 2x / week   PT Duration 8 weeks   PT Treatment/Interventions ADLs/Self Care Home Management;Cryotherapy;Electrical Stimulation;Functional mobility training;Stair training;Gait training;Ultrasound;Moist Heat;Therapeutic activities;Therapeutic exercise;Neuromuscular re-education;Patient/family education;Passive range of motion;Scar mobilization;Manual techniques;Taping;Vasopneumatic Device;Dry needling;Iontophoresis 29m/ml Dexamethasone   PT Next Visit Plan Ionto #3, ankle stabilization exercises   Consulted and Agree with Plan of Care Patient      Patient will benefit from skilled therapeutic intervention in order to improve the following deficits and impairments:  Abnormal gait, Difficulty walking, Pain, Increased edema, Decreased strength, Decreased balance, Impaired flexibility, Decreased activity  tolerance  Visit Diagnosis: Other abnormalities of gait  and mobility  Pain in left ankle and joints of left foot  Muscle weakness (generalized)     Problem List Patient Active Problem List   Diagnosis Date Noted  . Sleep related headaches 09/07/2016  . Nightmares REM-sleep type 09/07/2016  . RLS (restless legs syndrome) 09/07/2016  . Snoring 09/07/2016  . Excessive daytime sleepiness 09/07/2016  . Migraine 07/28/2016  . Solitary pulmonary nodule 06/08/2016  . S/P left TKA 05/05/2015  . S/P knee replacement 05/05/2015  . Former smoker 07/10/2014  . Exertional shortness of breath 06/07/2014  . Chest pain 06/07/2014  . Osteoarthritis 05/16/2014  . Alcoholic fatty liver 20/94/7096  . Chronic post-traumatic stress disorder (PTSD) 05/02/2014  . Sleep disorder 05/02/2014  . Family history of alcoholism 05/02/2014  . Macrocytosis 04/19/2014  . Fatigue 08/15/2013  . Thyroid nodule 09/17/2012  . Hypothyroid 07/21/2010  . Type II diabetes mellitus, well controlled ([place]) 06/23/2010  . HTN (hypertension) 06/23/2010  . Alcohol use disorder, severe, dependence ([place]) 06/23/2010  . Hyperlipidemia 06/23/2010  . Morbid obesity ([place]) 06/23/2010  . Chronic depression 06/23/2010  . History of colonic polyps 04/12/2010    [name] [name], PTA 09/28/2016, 7:06 PM  [facility] [address] [place], [place], [address] Phone: [phone]   Fax:  [phone]  Name: Danee Soller MRN: [id number] Date of Birth: [birth year]

## 2016-09-30 ENCOUNTER — Ambulatory Visit: Payer: No Typology Code available for payment source | Admitting: Physical Therapy

## 2016-09-30 DIAGNOSIS — R2689 Other abnormalities of gait and mobility: Secondary | ICD-10-CM

## 2016-09-30 DIAGNOSIS — M6281 Muscle weakness (generalized): Secondary | ICD-10-CM

## 2016-09-30 DIAGNOSIS — M25572 Pain in left ankle and joints of left foot: Secondary | ICD-10-CM

## 2016-09-30 NOTE — Therapy (Signed)
[facility] [address] [place], [place], [address] Phone: [phone]   Fax:  [phone]  Physical Therapy Treatment  Patient Details  Name: [name] [name] MRN: [id number] Date of Birth: [birth year] Referring Provider: Mechele Claude, [facility]  Encounter Date: 09/30/2016      PT End of Session - 09/30/16 1012    Visit Number 4   Date for PT Re-Evaluation 11/09/16   PT Start Time 0932   PT Stop Time 1012   PT Time Calculation (min) 40 min   Activity Tolerance Patient tolerated treatment well   Behavior During Therapy [facility] for tasks assessed/performed      Past Medical History:  Diagnosis Date  . Acute meniscal tear of knee    Left knee  . Alcohol problem drinking    rehab  . Anemia    menstrual related  . Anxiety   . Arthritis    right ankle-neuropathy  . Colon polyps   . Depression   . DM (diabetes mellitus), type 2 ([place]) 12/2009   type 2  . Evalluate for OSA (obstructive sleep apnea) 08/15/2013   No OSA on sleep study but may be underestimated.-never used cpap    . GERD (gastroesophageal reflux disease)    not currently taking.  Marland Kitchen Heart murmur   . History of recurrent UTIs   . Hyperlipidemia   . Hypertension   . Hypothyroidism   . Liver disease   . Neuromuscular disorder ([place])    neropathy bilateral feet.  . 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 Shortness of breath    with exertion   . Thyroid disease     Past Surgical History:  Procedure Laterality Date  . ADENOIDECTOMY    . BREAST SURGERY Right    lumpectomy-benign  . CESAREAN SECTION  1987  . CHOLECYSTECTOMY    . MASS EXCISION Left 12/16/2013   Procedure: EXCISION LEFT  DISTAL THIGH MASS;  Surgeon: Mauri Pole, MD;  [facility];  Service: Orthopedics;  Laterality: Left;  . right ankle surgery      x 2- no retained hardware  . right rotator cuff surgery     . TONSILLECTOMY    . TOTAL KNEE  ARTHROPLASTY  2009   right  . TOTAL KNEE ARTHROPLASTY Left 05/05/2015   Procedure: LEFT TOTAL KNEE ARTHROPLASTY;  Surgeon: Paralee Cancel, MD;  [facility];  Service: Orthopedics;  Laterality: Left;  . TOTAL KNEE REVISION Right 12/16/2013   Procedure: RIGHT TOTAL KNEE REVISION POLY EXCHANGE;  Surgeon: Mauri Pole, MD;  [facility];  Service: Orthopedics;  Laterality: Right;  . TUBAL LIGATION  1987    There were no vitals filed for this visit.      Subjective Assessment - 09/30/16 0941    Subjective Pt states no pain yet because she hasn't been on it yet today   Pertinent History Rt ankle ligament repair surgery 02/04/16.    Limitations Standing;Walking   How long can you stand comfortably? 1-2 hours but pain   How long can you walk comfortably? 1-2 hours but pain   Diagnostic tests x-ray: inflammation   Patient Stated Goals normalize gait, reduce pain, improve strength and mobility of the ankle   Currently in Pain? No/denies                         [facility] Adult PT Treatment/Exercise - 09/30/16 0001  Knee/Hip Exercises: Stretches   Press photographer Left;3 reps;30 seconds     Knee/Hip Exercises: Aerobic   Nustep L3 x 10 min  review of pt status, discussed footwear     Knee/Hip Exercises: Standing   Heel Raises Both;2 sets;10 reps     Iontophoresis   Type of Iontophoresis Dexamethasone  #2 skin intact   Location Lt ankle   Dose 1 ml   Time 6 hr wear     Ankle Exercises: Seated   BAPS Sitting;Level 3;Other (comment)  2 min each direction     Ankle Exercises: Standing   SLS 3 x20 sec on blue pod  1 balance pod required     Ankle Exercises: Stretches   Soleus Stretch 3 reps;20 seconds   Gastroc Stretch 3 reps;20 seconds     Ankle Exercises: Supine   T-Band red band inv/eversion, DF - 15x each way                  PT Short Term Goals - 09/28/16 1859      PT SHORT TERM GOAL #1   Title be independent in initial HEP   Time 4    Period Weeks   Status Achieved     PT SHORT TERM GOAL #2   Title report < or = to 6/10 Lt ankle pain with standing and walking   Time 4   Period Weeks   Status On-going  improving, no pain 24-36 hrs, then some pain 5/10           PT Long Term Goals - 09/14/16 1535      PT LONG TERM GOAL #1   Title be independent in advanced HEP   Time 8   Period Weeks   Status New     PT LONG TERM GOAL #2   Title reduce FOTO to < or = to 47% limitation   Baseline --   Time 8   Period Weeks   Status New     PT LONG TERM GOAL #3   Title report < or = to 4/10 Lt ankle pain with standing and walking   Time 8   Period Weeks   Status New     PT LONG TERM GOAL #4   Title demonstrate symmetry with gait on level surfaces   Time 8   Period Weeks   Status New     PT LONG TERM GOAL #5   Title demonstrate 4+/5 Lt ankle inversion and eversion to improve stability in the community   Time 8   Period Weeks   Status New     PT LONG TERM GOAL #6   Title __               Plan - 09/30/16 1001    Clinical Impression Statement Patient has difficulty with balance activities, needs min assist for tandem walking.  Pt has noticed some improvement with ionto treatment.  She continues to have weakness and instability and will benefit from skilled therapy to address impairments and return to maximum function.     PT Treatment/Interventions ADLs/Self Care Home Management;Cryotherapy;Electrical Stimulation;Functional mobility training;Stair training;Gait training;Ultrasound;Moist Heat;Therapeutic activities;Therapeutic exercise;Neuromuscular re-education;Patient/family education;Passive range of motion;Scar mobilization;Manual techniques;Taping;Vasopneumatic Device;Dry needling;Iontophoresis 4mg /ml Dexamethasone   PT Next Visit Plan Ionto #4, ankle stabilization exercises   Consulted and Agree with Plan of Care Patient      Patient will benefit from skilled therapeutic intervention in order to  improve the following deficits and impairments:  Abnormal gait,  Difficulty walking, Pain, Increased edema, Decreased strength, Decreased balance, Impaired flexibility, Decreased activity tolerance  Visit Diagnosis: Other abnormalities of gait and mobility  Pain in left ankle and joints of left foot  Muscle weakness (generalized)     Problem List Patient Active Problem List   Diagnosis Date Noted  . Sleep related headaches 09/07/2016  . Nightmares REM-sleep type 09/07/2016  . RLS (restless legs syndrome) 09/07/2016  . Snoring 09/07/2016  . Excessive daytime sleepiness 09/07/2016  . Migraine 07/28/2016  . Solitary pulmonary nodule 06/08/2016  . S/P left TKA 05/05/2015  . S/P knee replacement 05/05/2015  . Former smoker 07/10/2014  . Exertional shortness of breath 06/07/2014  . Chest pain 06/07/2014  . Osteoarthritis 05/16/2014  . Alcoholic fatty liver 27/61/8485  . Chronic post-traumatic stress disorder (PTSD) 05/02/2014  . Sleep disorder 05/02/2014  . Family history of alcoholism 05/02/2014  . Macrocytosis 04/19/2014  . Fatigue 08/15/2013  . Thyroid nodule 09/17/2012  . Hypothyroid 07/21/2010  . Type II diabetes mellitus, well controlled ([place]) 06/23/2010  . HTN (hypertension) 06/23/2010  . Alcohol use disorder, severe, dependence ([place]) 06/23/2010  . Hyperlipidemia 06/23/2010  . Morbid obesity ([place]) 06/23/2010  . Chronic depression 06/23/2010  . History of colonic polyps 04/12/2010    Zannie Cove, PT 09/30/2016, 10:14 AM  [facility] [address], [place], [place], [address] Phone: [phone]   Fax:  [phone]  Name: [name] [name] MRN: [id number] Date of Birth: [birth year]

## 2016-10-03 ENCOUNTER — Ambulatory Visit: Payer: No Typology Code available for payment source

## 2016-10-03 DIAGNOSIS — R2689 Other abnormalities of gait and mobility: Secondary | ICD-10-CM

## 2016-10-03 DIAGNOSIS — M6281 Muscle weakness (generalized): Secondary | ICD-10-CM

## 2016-10-03 DIAGNOSIS — M25572 Pain in left ankle and joints of left foot: Secondary | ICD-10-CM

## 2016-10-03 NOTE — Therapy (Signed)
[facility] [address] [place], [place], [address] Phone: [phone]   Fax:  [phone]  Physical Therapy Treatment  Patient Details  Name: [name] [name] MRN: [id number] Date of Birth: 19-Mar-1955 Referring Provider: Mechele Claude, [facility]  Encounter Date: 10/03/2016      PT End of Session - 10/03/16 1658    Visit Number 5   Date for PT Re-Evaluation 11/09/16   PT Start Time 1619   PT Stop Time 1659   PT Time Calculation (min) 40 min   Activity Tolerance Patient tolerated treatment well   Behavior During Therapy [facility] for tasks assessed/performed      Past Medical History:  Diagnosis Date  . Acute meniscal tear of knee    Left knee  . Alcohol problem drinking    rehab  . Anemia    menstrual related  . Anxiety   . Arthritis    right ankle-neuropathy  . Colon polyps   . Depression   . DM (diabetes mellitus), type 2 ([place]) 12/2009   type 2  . Evalluate for OSA (obstructive sleep apnea) 08/15/2013   No OSA on sleep study but may be underestimated.-never used cpap    . GERD (gastroesophageal reflux disease)    not currently taking.  Marland Kitchen Heart murmur   . History of recurrent UTIs   . Hyperlipidemia   . Hypertension   . Hypothyroidism   . Liver disease   . Neuromuscular disorder ([place])    neropathy bilateral feet.  . 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 Shortness of breath    with exertion   . Thyroid disease     Past Surgical History:  Procedure Laterality Date  . ADENOIDECTOMY    . BREAST SURGERY Right    lumpectomy-benign  . CESAREAN SECTION  1987  . CHOLECYSTECTOMY    . MASS EXCISION Left 12/16/2013   Procedure: EXCISION LEFT  DISTAL THIGH MASS;  Surgeon: Mauri Pole, MD;  [facility];  Service: Orthopedics;  Laterality: Left;  . right ankle surgery      x 2- no retained hardware  . right rotator cuff surgery     . TONSILLECTOMY    . TOTAL KNEE  ARTHROPLASTY  2009   right  . TOTAL KNEE ARTHROPLASTY Left 05/05/2015   Procedure: LEFT TOTAL KNEE ARTHROPLASTY;  Surgeon: Paralee Cancel, MD;  [facility];  Service: Orthopedics;  Laterality: Left;  . TOTAL KNEE REVISION Right 12/16/2013   Procedure: RIGHT TOTAL KNEE REVISION POLY EXCHANGE;  Surgeon: Mauri Pole, MD;  [facility];  Service: Orthopedics;  Laterality: Right;  . TUBAL LIGATION  1987    There were no vitals filed for this visit.      Subjective Assessment - 10/03/16 1703    Subjective Doing well.  The patches are helping.     Currently in Pain? No/denies                         [facility] Adult PT Treatment/Exercise - 10/03/16 0001      Knee/Hip Exercises: Aerobic   Nustep L3 x 10 min  review of pt status     Knee/Hip Exercises: Standing   Heel Raises Both;2 sets;10 reps   Forward Step Up --     Iontophoresis   Type of Iontophoresis Dexamethasone  #4   Location Lt ankle   Dose 1 ml  Time 6 hr wear     Ankle Exercises: Seated   BAPS Sitting;Level 3;Other (comment)  2 min each direction     Ankle Exercises: Standing   SLS 3 x20 sec on blue pod  1 balance pod required   Rocker Board 3 minutes   Balance Beam tandem line walk 2x25 seconds   Other Standing Ankle Exercises resisted walking 20# 10x forward and reverse     Ankle Exercises: Stretches   Gastroc Stretch 3 reps;20 seconds                  PT Short Term Goals - 10/03/16 1622      PT SHORT TERM GOAL #1   Title be independent in initial HEP   Status Achieved     PT SHORT TERM GOAL #2   Title report < or = to 6/10 Lt ankle pain with standing and walking   Baseline 6/10 Lt ankle pain   Status Achieved           PT Long Term Goals - 09/14/16 1535      PT LONG TERM GOAL #1   Title be independent in advanced HEP   Time 8   Period Weeks   Status New     PT LONG TERM GOAL #2   Title reduce FOTO to < or = to 47% limitation   Baseline --   Time 8    Period Weeks   Status New     PT LONG TERM GOAL #3   Title report < or = to 4/10 Lt ankle pain with standing and walking   Time 8   Period Weeks   Status New     PT LONG TERM GOAL #4   Title demonstrate symmetry with gait on level surfaces   Time 8   Period Weeks   Status New     PT LONG TERM GOAL #5   Title demonstrate 4+/5 Lt ankle inversion and eversion to improve stability in the community   Time 8   Period Weeks   Status New     PT LONG TERM GOAL #6   Title __               Plan - 10/03/16 1623    Clinical Impression Statement Pt reports that patches have been helping with Lt ankle pain.  Pt with Lt ankle instability and weakness with single leg and proprioception activity in the clinic today.  Pt will continue to benefit from skilled PT for Lt ankle strength, endurance, proprioception and 2 more ionto patches for pain.     Rehab Potential Good   PT Frequency 2x / week   PT Duration 8 weeks   PT Treatment/Interventions ADLs/Self Care Home Management;Cryotherapy;Electrical Stimulation;Functional mobility training;Stair training;Gait training;Ultrasound;Moist Heat;Therapeutic activities;Therapeutic exercise;Neuromuscular re-education;Patient/family education;Passive range of motion;Scar mobilization;Manual techniques;Taping;Vasopneumatic Device;Dry needling;Iontophoresis 4mg /ml Dexamethasone   PT Next Visit Plan Ionto #5, ankle stabilization exercises      Patient will benefit from skilled therapeutic intervention in order to improve the following deficits and impairments:  Abnormal gait, Difficulty walking, Pain, Increased edema, Decreased strength, Decreased balance, Impaired flexibility, Decreased activity tolerance  Visit Diagnosis: Other abnormalities of gait and mobility  Pain in left ankle and joints of left foot  Muscle weakness (generalized)     Problem List Patient Active Problem List   Diagnosis Date Noted  . Sleep related headaches 09/07/2016   . Nightmares REM-sleep type 09/07/2016  . RLS (restless legs syndrome) 09/07/2016  .  Snoring 09/07/2016  . Excessive daytime sleepiness 09/07/2016  . Migraine 07/28/2016  . Solitary pulmonary nodule 06/08/2016  . S/P left TKA 05/05/2015  . S/P knee replacement 05/05/2015  . Former smoker 07/10/2014  . Exertional shortness of breath 06/07/2014  . Chest pain 06/07/2014  . Osteoarthritis 05/16/2014  . Alcoholic fatty liver 37/90/2409  . Chronic post-traumatic stress disorder (PTSD) 05/02/2014  . Sleep disorder 05/02/2014  . Family history of alcoholism 05/02/2014  . Macrocytosis 04/19/2014  . Fatigue 08/15/2013  . Thyroid nodule 09/17/2012  . Hypothyroid 07/21/2010  . Type II diabetes mellitus, well controlled ([place]) 06/23/2010  . HTN (hypertension) 06/23/2010  . Alcohol use disorder, severe, dependence ([place]) 06/23/2010  . Hyperlipidemia 06/23/2010  . Morbid obesity ([place]) 06/23/2010  . Chronic depression 06/23/2010  . History of colonic polyps 04/12/2010    Sigurd Sos, PT 10/03/16 5:04 PM  [facility] [address] [place], [place], [address] Phone: [phone]   Fax:  [phone]  Name: [name] [name] MRN: [id number] Date of Birth: [birth year]

## 2016-10-05 ENCOUNTER — Encounter: Payer: Self-pay | Admitting: Physical Therapy

## 2016-10-05 ENCOUNTER — Ambulatory Visit: Payer: No Typology Code available for payment source | Admitting: Physical Therapy

## 2016-10-05 DIAGNOSIS — R2689 Other abnormalities of gait and mobility: Secondary | ICD-10-CM | POA: Diagnosis not present

## 2016-10-05 DIAGNOSIS — M6281 Muscle weakness (generalized): Secondary | ICD-10-CM

## 2016-10-05 DIAGNOSIS — M25572 Pain in left ankle and joints of left foot: Secondary | ICD-10-CM

## 2016-10-05 DIAGNOSIS — M25571 Pain in right ankle and joints of right foot: Secondary | ICD-10-CM

## 2016-10-05 DIAGNOSIS — M25671 Stiffness of right ankle, not elsewhere classified: Secondary | ICD-10-CM

## 2016-10-05 NOTE — Therapy (Signed)
[facility] [address] [place], [place], [address] Phone: [phone]   Fax:  [phone]  Physical Therapy Treatment  Patient Details  Name: [name] [name] MRN: [id number] Date of Birth: May 29, 1955 Referring Provider: Mechele Claude, [facility]  Encounter Date: 10/05/2016      PT End of Session - 10/05/16 1610    Visit Number 6   Date for PT Re-Evaluation 11/09/16   PT Start Time 6314   PT Stop Time 9702   PT Time Calculation (min) 40 min   Activity Tolerance Patient tolerated treatment well;No increased pain   Behavior During Therapy --      Past Medical History:  Diagnosis Date  . Acute meniscal tear of knee    Left knee  . Alcohol problem drinking    rehab  . Anemia    menstrual related  . Anxiety   . Arthritis    right ankle-neuropathy  . Colon polyps   . Depression   . DM (diabetes mellitus), type 2 ([place]) 12/2009   type 2  . Evalluate for OSA (obstructive sleep apnea) 08/15/2013   No OSA on sleep study but may be underestimated.-never used cpap    . GERD (gastroesophageal reflux disease)    not currently taking.  Marland Kitchen Heart murmur   . History of recurrent UTIs   . Hyperlipidemia   . Hypertension   . Hypothyroidism   . Liver disease   . Neuromuscular disorder ([place])    neropathy bilateral feet.  . 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 Shortness of breath    with exertion   . Thyroid disease     Past Surgical History:  Procedure Laterality Date  . ADENOIDECTOMY    . BREAST SURGERY Right    lumpectomy-benign  . CESAREAN SECTION  1987  . CHOLECYSTECTOMY    . MASS EXCISION Left 12/16/2013   Procedure: EXCISION LEFT  DISTAL THIGH MASS;  Surgeon: Mauri Pole, MD;  Location: [facility];  Service: Orthopedics;  Laterality: Left;  . right ankle surgery      x 2- no retained hardware  . right rotator cuff surgery     . TONSILLECTOMY    . TOTAL KNEE ARTHROPLASTY   2009   right  . TOTAL KNEE ARTHROPLASTY Left 05/05/2015   Procedure: LEFT TOTAL KNEE ARTHROPLASTY;  Surgeon: Paralee Cancel, MD;  Location: [facility];  Service: Orthopedics;  Laterality: Left;  . TOTAL KNEE REVISION Right 12/16/2013   Procedure: RIGHT TOTAL KNEE REVISION POLY EXCHANGE;  Surgeon: Mauri Pole, MD;  Location: [facility];  Service: Orthopedics;  Laterality: Right;  . TUBAL LIGATION  1987    There were no vitals filed for this visit.      Subjective Assessment - 10/05/16 1614    Subjective I woke up with pain/soreness in my arch. Not sure why but it has stayed all day.    Currently in Pain? Yes   Pain Score 8    Pain Location --  Arch of the foot   Pain Orientation Left   Pain Descriptors / Indicators Sore   Aggravating Factors  Happened when I woke up this AM.    Pain Relieving Factors Ionto, rest, ice   Multiple Pain Sites No                         [facility] Adult PT Treatment/Exercise - 10/05/16 0001  Knee/Hip Exercises: Aerobic   Nustep L3 x 10 min  review of pt status     Iontophoresis   Type of Iontophoresis Dexamethasone  #5   Location Lt ankle   Dose 1 ml  Lot # [id number]   Time 6 hr wear  skin intact     Ankle Exercises: Stretches   Plantar Fascia Stretch 3 reps;30 seconds  Fascial release/arch stretch with small blue hydration ball   Gastroc Stretch 3 reps;20 seconds  Seated with towel     Ankle Exercises: Seated   BAPS Sitting;Level 3;Other (comment)  2 min each direction     Ankle Exercises: Standing   SLS Declined all standing exs today secondary to pain                  PT Short Term Goals - 10/03/16 1622      PT SHORT TERM GOAL #1   Title be independent in initial HEP   Status Achieved     PT SHORT TERM GOAL #2   Title report < or = to 6/10 Lt ankle pain with standing and walking   Baseline 6/10 Lt ankle pain   Status Achieved           PT Long Term Goals - 09/14/16 1535      PT LONG TERM GOAL #1    Title be independent in advanced HEP   Time 8   Period Weeks   Status New     PT LONG TERM GOAL #2   Title reduce FOTO to < or = to 47% limitation   Baseline --   Time 8   Period Weeks   Status New     PT LONG TERM GOAL #3   Title report < or = to 4/10 Lt ankle pain with standing and walking   Time 8   Period Weeks   Status New     PT LONG TERM GOAL #4   Title demonstrate symmetry with gait on level surfaces   Time 8   Period Weeks   Status New     PT LONG TERM GOAL #5   Title demonstrate 4+/5 Lt ankle inversion and eversion to improve stability in the community   Time 8   Period Weeks   Status New     PT LONG TERM GOAL #6   Title __               Plan - 10/05/16 1613    Clinical Impression Statement Pt's ankle is significnatly better. Today she reported pain in her arch that increased with weighbearing. Modifications were made today during the tx to accomodate this pain. Original lateral ankle pain is 50% less painful.   Rehab Potential Good   PT Frequency 2x / week   PT Duration 8 weeks   PT Treatment/Interventions ADLs/Self Care Home Management;Cryotherapy;Electrical Stimulation;Functional mobility training;Stair training;Gait training;Ultrasound;Moist Heat;Therapeutic activities;Therapeutic exercise;Neuromuscular re-education;Patient/family education;Passive range of motion;Scar mobilization;Manual techniques;Taping;Vasopneumatic Device;Dry needling;Iontophoresis 4mg /ml Dexamethasone   Consulted and Agree with Plan of Care Patient      Patient will benefit from skilled therapeutic intervention in order to improve the following deficits and impairments:     Visit Diagnosis: Other abnormalities of gait and mobility  Pain in left ankle and joints of left foot  Muscle weakness (generalized)  Pain in right ankle and joints of right foot  Stiffness of right ankle, not elsewhere classified     Problem List Patient Active Problem List   Diagnosis  Date Noted  . Sleep related headaches 09/07/2016  . Nightmares REM-sleep type 09/07/2016  . RLS (restless legs syndrome) 09/07/2016  . Snoring 09/07/2016  . Excessive daytime sleepiness 09/07/2016  . Migraine 07/28/2016  . Solitary pulmonary nodule 06/08/2016  . S/P left TKA 05/05/2015  . S/P knee replacement 05/05/2015  . Former smoker 07/10/2014  . Exertional shortness of breath 06/07/2014  . Chest pain 06/07/2014  . Osteoarthritis 05/16/2014  . Alcoholic fatty liver 04/88/8916  . Chronic post-traumatic stress disorder (PTSD) 05/02/2014  . Sleep disorder 05/02/2014  . Family history of alcoholism 05/02/2014  . Macrocytosis 04/19/2014  . Fatigue 08/15/2013  . Thyroid nodule 09/17/2012  . Hypothyroid 07/21/2010  . Type II diabetes mellitus, well controlled ([place]) 06/23/2010  . HTN (hypertension) 06/23/2010  . Alcohol use disorder, severe, dependence ([place]) 06/23/2010  . Hyperlipidemia 06/23/2010  . Morbid obesity ([place]) 06/23/2010  . Chronic depression 06/23/2010  . History of colonic polyps 04/12/2010    Lerry Cordrey, PTA 10/05/2016, 4:49 PM  [facility] [address], [place], [place], [address] Phone: [phone]   Fax:  [phone]  Name: Annai Heick MRN: [id number] Date of Birth: 05-11-1955

## 2016-10-09 ENCOUNTER — Other Ambulatory Visit: Payer: Self-pay | Admitting: Family Medicine

## 2016-10-11 ENCOUNTER — Ambulatory Visit: Payer: No Typology Code available for payment source | Admitting: Physical Therapy

## 2016-10-12 NOTE — Telephone Encounter (Signed)
Pharmacy calling stating that they have faxed Rx bupropion several times last week and have not heard anything back

## 2016-10-13 ENCOUNTER — Ambulatory Visit: Payer: No Typology Code available for payment source | Admitting: Physical Therapy

## 2016-10-13 DIAGNOSIS — M25572 Pain in left ankle and joints of left foot: Secondary | ICD-10-CM

## 2016-10-13 DIAGNOSIS — R2689 Other abnormalities of gait and mobility: Secondary | ICD-10-CM | POA: Diagnosis not present

## 2016-10-13 DIAGNOSIS — M6281 Muscle weakness (generalized): Secondary | ICD-10-CM

## 2016-10-13 NOTE — Therapy (Signed)
[facility] [address] [place], [place], [address] Phone: [phone]   Fax:  [phone]  Physical Therapy Treatment  Patient Details  Name: [name] [name] MRN: [id number] Date of Birth: 12-08-1955 Referring Provider: Mechele Claude, [facility]  Encounter Date: 10/13/2016      PT End of Session - 10/13/16 1716    Visit Number 7   Date for PT Re-Evaluation 11/09/16   PT Start Time 1620   PT Stop Time 1700   PT Time Calculation (min) 40 min   Activity Tolerance Patient tolerated treatment well      Past Medical History:  Diagnosis Date  . Acute meniscal tear of knee    Left knee  . Alcohol problem drinking    rehab  . Anemia    menstrual related  . Anxiety   . Arthritis    right ankle-neuropathy  . Colon polyps   . Depression   . DM (diabetes mellitus), type 2 ([place]) 12/2009   type 2  . Evalluate for OSA (obstructive sleep apnea) 08/15/2013   No OSA on sleep study but may be underestimated.-never used cpap    . GERD (gastroesophageal reflux disease)    not currently taking.  Marland Kitchen Heart murmur   . History of recurrent UTIs   . Hyperlipidemia   . Hypertension   . Hypothyroidism   . Liver disease   . Neuromuscular disorder ([place])    neropathy bilateral feet.  . 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 Shortness of breath    with exertion   . Thyroid disease     Past Surgical History:  Procedure Laterality Date  . ADENOIDECTOMY    . BREAST SURGERY Right    lumpectomy-benign  . CESAREAN SECTION  1987  . CHOLECYSTECTOMY    . MASS EXCISION Left 12/16/2013   Procedure: EXCISION LEFT  DISTAL THIGH MASS;  Surgeon: Mauri Pole, MD;  [facility];  Service: Orthopedics;  Laterality: Left;  . right ankle surgery      x 2- no retained hardware  . right rotator cuff surgery     . TONSILLECTOMY    . TOTAL KNEE ARTHROPLASTY  2009   right  . TOTAL KNEE ARTHROPLASTY Left  05/05/2015   Procedure: LEFT TOTAL KNEE ARTHROPLASTY;  Surgeon: Paralee Cancel, MD;  [facility];  Service: Orthopedics;  Laterality: Left;  . TOTAL KNEE REVISION Right 12/16/2013   Procedure: RIGHT TOTAL KNEE REVISION POLY EXCHANGE;  Surgeon: Mauri Pole, MD;  [facility];  Service: Orthopedics;  Laterality: Right;  . TUBAL LIGATION  1987    There were no vitals filed for this visit.      Subjective Assessment - 10/13/16 1623    Subjective It hasn't been too bad this week.  Went back to the doctor last Fri b/c of arch pain and he said I have plantar fascitis and gave me some stretches, ice and taking ibuprofen.  Just came from having a full body massage.  She did work on my arch but she said my hamstring and hip were tight.     Pertinent History Rt ankle ligament repair surgery 02/04/16.    Currently in Pain? Yes   Pain Score 4    Pain Location Ankle  arch pain   Pain Orientation Left;Medial   Pain Type Chronic pain   Aggravating Factors  walking on cement or tile;   Pain Relieving Factors ice;  elevation;  wearing a cushioned sole and arch support                         [facility] Adult PT Treatment/Exercise - 10/13/16 0001      Neuro Re-ed    Neuro Re-ed Details  intrinsic foot muscle activation      Knee/Hip Exercises: Stretches   Gastroc Stretch Left;3 reps;30 seconds  with towel ;  also plantar fascia toe stretch 3x 30 sec     Knee/Hip Exercises: Aerobic   Nustep L2 x 10 min  review of pt status     Iontophoresis   Type of Iontophoresis Dexamethasone  #5   Location Lt ankle   Dose 1 ml  Lot # [id number]   Time 6 hr wear  skin intact     Ankle Exercises: Seated   Towel Crunch 5 reps   Other Seated Ankle Exercises arch lifts bil 10x   Other Seated Ankle Exercises red band plantarflexion and eversion 10x each     Ankle Exercises: Standing   Other Standing Ankle Exercises arch lifts 10x bil                  PT Short Term Goals -  10/13/16 1722      PT SHORT TERM GOAL #1   Title be independent in initial HEP   Status Achieved     PT SHORT TERM GOAL #2   Title report < or = to 6/10 Lt ankle pain with standing and walking   Status Achieved           PT Long Term Goals - 10/13/16 1722      PT LONG TERM GOAL #1   Title be independent in advanced HEP   Time 8   Period Weeks   Status On-going     PT LONG TERM GOAL #2   Title reduce FOTO to < or = to 47% limitation   Time 8   Period Weeks   Status On-going     PT LONG TERM GOAL #3   Title report < or = to 4/10 Lt ankle pain with standing and walking   Period Weeks   Status On-going     PT LONG TERM GOAL #4   Title demonstrate symmetry with gait on level surfaces   Time 8   Period Weeks   Status On-going     PT LONG TERM GOAL #5   Title demonstrate 4+/5 Lt ankle inversion and eversion to improve stability in the community   Time 8   Period Weeks   Status On-going               Plan - 10/13/16 1716    Clinical Impression Statement The patient reports it has been a better week.  Discussed strategies for plantar fascitis and intrinsic muscle strengthening for moderate pes planus bilaterally.  Last iontophoresis of series completed.     Rehab Potential Good   PT Frequency 2x / week   PT Duration 8 weeks   PT Next Visit Plan ankle and foot stabilization exercises;  assess progress toward goals to determine discharge or continuation of treatment plan      Patient will benefit from skilled therapeutic intervention in order to improve the following deficits and impairments:  Abnormal gait, Difficulty walking, Pain, Increased edema, Decreased strength, Decreased balance, Impaired flexibility, Decreased activity tolerance  Visit Diagnosis: Other abnormalities of gait and mobility  Pain in left  ankle and joints of left foot  Muscle weakness (generalized)     Problem List Patient Active Problem List   Diagnosis Date Noted  . Sleep  related headaches 09/07/2016  . Nightmares REM-sleep type 09/07/2016  . RLS (restless legs syndrome) 09/07/2016  . Snoring 09/07/2016  . Excessive daytime sleepiness 09/07/2016  . Migraine 07/28/2016  . Solitary pulmonary nodule 06/08/2016  . S/P left TKA 05/05/2015  . S/P knee replacement 05/05/2015  . Former smoker 07/10/2014  . Exertional shortness of breath 06/07/2014  . Chest pain 06/07/2014  . Osteoarthritis 05/16/2014  . Alcoholic fatty liver 42/59/5638  . Chronic post-traumatic stress disorder (PTSD) 05/02/2014  . Sleep disorder 05/02/2014  . Family history of alcoholism 05/02/2014  . Macrocytosis 04/19/2014  . Fatigue 08/15/2013  . Thyroid nodule 09/17/2012  . Hypothyroid 07/21/2010  . Type II diabetes mellitus, well controlled ([place]) 06/23/2010  . HTN (hypertension) 06/23/2010  . Alcohol use disorder, severe, dependence ([place]) 06/23/2010  . Hyperlipidemia 06/23/2010  . Morbid obesity ([place]) 06/23/2010  . Chronic depression 06/23/2010  . History of colonic polyps 04/12/2010   Ruben Im, PT 10/13/16 5:24 PM Phone: [phone] Fax: [phone]  Alvera Singh 10/13/2016, 5:24 PM  [facility] [address] [place], [place], [address] Phone: [phone]   Fax:  [phone]  Name: Zamyah Wiesman MRN: [id number] Date of Birth: Mar 28, 1955

## 2016-10-18 ENCOUNTER — Ambulatory Visit: Payer: No Typology Code available for payment source | Admitting: Physical Therapy

## 2016-10-18 DIAGNOSIS — R2689 Other abnormalities of gait and mobility: Secondary | ICD-10-CM

## 2016-10-18 DIAGNOSIS — M6281 Muscle weakness (generalized): Secondary | ICD-10-CM

## 2016-10-18 DIAGNOSIS — M25572 Pain in left ankle and joints of left foot: Secondary | ICD-10-CM

## 2016-10-18 NOTE — Therapy (Signed)
[facility] [address], [place], [place], [address] Phone: [phone]   Fax:  [phone]  Physical Therapy Treatment/Discharge Summary  Patient Details  Name: [name] [name] MRN: [id number] Date of Birth: 12/21/1955 Referring Provider: Mechele Claude, [facility]  Encounter Date: 10/18/2016      PT End of Session - 10/18/16 1608    Visit Number 8   Date for PT Re-Evaluation 11/09/16   PT Start Time 6767   PT Stop Time 2094  discharge visit   PT Time Calculation (min) 32 min   Activity Tolerance Patient tolerated treatment well      Past Medical History:  Diagnosis Date  . Acute meniscal tear of knee    Left knee  . Alcohol problem drinking    rehab  . Anemia    menstrual related  . Anxiety   . Arthritis    right ankle-neuropathy  . Colon polyps   . Depression   . DM (diabetes mellitus), type 2 ([place]) 12/2009   type 2  . Evalluate for OSA (obstructive sleep apnea) 08/15/2013   No OSA on sleep study but may be underestimated.-never used cpap    . GERD (gastroesophageal reflux disease)    not currently taking.  Marland Kitchen Heart murmur   . History of recurrent UTIs   . Hyperlipidemia   . Hypertension   . Hypothyroidism   . Liver disease   . Neuromuscular disorder ([place])    neropathy bilateral feet.  . 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 Shortness of breath    with exertion   . Thyroid disease     Past Surgical History:  Procedure Laterality Date  . ADENOIDECTOMY    . BREAST SURGERY Right    lumpectomy-benign  . CESAREAN SECTION  1987  . CHOLECYSTECTOMY    . MASS EXCISION Left 12/16/2013   Procedure: EXCISION LEFT  DISTAL THIGH MASS;  Surgeon: Mauri Pole, MD;  [facility];  Service: Orthopedics;  Laterality: Left;  . right ankle surgery      x 2- no retained hardware  . right rotator cuff surgery     . TONSILLECTOMY    . TOTAL KNEE ARTHROPLASTY  2009    right  . TOTAL KNEE ARTHROPLASTY Left 05/05/2015   Procedure: LEFT TOTAL KNEE ARTHROPLASTY;  Surgeon: Paralee Cancel, MD;  [facility];  Service: Orthopedics;  Laterality: Left;  . TOTAL KNEE REVISION Right 12/16/2013   Procedure: RIGHT TOTAL KNEE REVISION POLY EXCHANGE;  Surgeon: Mauri Pole, MD;  [facility];  Service: Orthopedics;  Laterality: Right;  . TUBAL LIGATION  1987    There were no vitals filed for this visit.      Subjective Assessment - 10/18/16 1535    Subjective Going OK.   My right knee has been bothering me.  I don't know if it is from massage or my foot?  Also had a migraine.  The pain will increase to a 7/10 with walking on cement or tile floor.  Patient states she feels she can do exercises on her own now and expresses readiness for discharge.     Pertinent History Rt ankle ligament repair surgery 02/04/16.    How long can you walk comfortably? 1 block comfortably before foot pain   Patient Stated Goals normalize gait, reduce pain, improve strength and mobility of the ankle   Currently in Pain? Yes   Pain Score 4  Pain Location Ankle   Pain Orientation Left   Aggravating Factors  walking on cement or tile   Pain Relieving Factors ice            [facility] PT Assessment - 10/18/16 0001      Observation/Other Assessments   Focus on Therapeutic Outcomes (FOTO)  56% limitation     AROM   Overall AROM Comments ankle ROM full and painless     Strength   Overall Strength Comments ankle strength grossly 4+/5, intrinsics 4+/5                     [facility] Adult PT Treatment/Exercise - 10/18/16 0001      Knee/Hip Exercises: Aerobic   Nustep L2 x 10 min  review of pt status                  PT Short Term Goals - 10/18/16 1549      PT SHORT TERM GOAL #1   Title be independent in initial HEP   Status Achieved     PT SHORT TERM GOAL #2   Title report < or = to 6/10 Lt ankle pain with standing and walking   Status Achieved            PT Long Term Goals - 10/18/16 1549      PT LONG TERM GOAL #1   Title be independent in advanced HEP   Status Achieved     PT LONG TERM GOAL #2   Title reduce FOTO to < or = to 47% limitation   Status Partially Met     PT LONG TERM GOAL #3   Title report < or = to 4/10 Lt ankle pain with standing and walking   Status Partially Met     PT LONG TERM GOAL #4   Title demonstrate symmetry with gait on level surfaces   Status Achieved     PT LONG TERM GOAL #5   Title demonstrate 4+/5 Lt ankle inversion and eversion to improve stability in the community   Status Achieved               Plan - 10/18/16 1547    Clinical Impression Statement Overall improvement in ankle is 70% better.  FOTO functional score improved from 62% to 56%.  The patient continues to have chronic pain in her knee and is also been diagnosed with plantar fascitis.  She has been instructed in a comprehensive HEP including exercises to strengthen foot intrinsic muscles and maintain her ROM (full and painless).  Her ankle strength has improved as well.  We have discussed her pes planus bilaterally and orthotic supports.  The patient expresses readiness for discharge from PT at this time to an independent HEP.     Rehab Potential Good   PT Frequency 2x / week   PT Duration 8 weeks   PT Treatment/Interventions ADLs/Self Care Home Management;Cryotherapy;Electrical Stimulation;Functional mobility training;Stair training;Gait training;Ultrasound;Moist Heat;Therapeutic activities;Therapeutic exercise;Neuromuscular re-education;Patient/family education;Passive range of motion;Scar mobilization;Manual techniques;Taping;Vasopneumatic Device;Dry needling;Iontophoresis 34m/ml Dexamethasone      Patient will benefit from skilled therapeutic intervention in order to improve the following deficits and impairments:  Abnormal gait, Difficulty walking, Pain, Increased edema, Decreased strength, Decreased balance, Impaired  flexibility, Decreased activity tolerance  Visit Diagnosis: Other abnormalities of gait and mobility  Pain in left ankle and joints of left foot  Muscle weakness (generalized)    PHYSICAL THERAPY DISCHARGE SUMMARY  Visits from Start of Care: 8  Current functional level related to goals / functional outcomes: See clinical impressions above   Remaining deficits: As above   Education / Equipment: HEP Plan: Patient agrees to discharge.  Patient goals were partially met. Patient is being discharged due to being pleased with the current functional level.  ?????        Problem List Patient Active Problem List   Diagnosis Date Noted  . Sleep related headaches 09/07/2016  . Nightmares REM-sleep type 09/07/2016  . RLS (restless legs syndrome) 09/07/2016  . Snoring 09/07/2016  . Excessive daytime sleepiness 09/07/2016  . Migraine 07/28/2016  . Solitary pulmonary nodule 06/08/2016  . S/P left TKA 05/05/2015  . S/P knee replacement 05/05/2015  . Former smoker 07/10/2014  . Exertional shortness of breath 06/07/2014  . Chest pain 06/07/2014  . Osteoarthritis 05/16/2014  . Alcoholic fatty liver 26/20/3559  . Chronic post-traumatic stress disorder (PTSD) 05/02/2014  . Sleep disorder 05/02/2014  . Family history of alcoholism 05/02/2014  . Macrocytosis 04/19/2014  . Fatigue 08/15/2013  . Thyroid nodule 09/17/2012  . Hypothyroid 07/21/2010  . Type II diabetes mellitus, well controlled ([place]) 06/23/2010  . HTN (hypertension) 06/23/2010  . Alcohol use disorder, severe, dependence ([place]) 06/23/2010  . Hyperlipidemia 06/23/2010  . Morbid obesity ([place]) 06/23/2010  . Chronic depression 06/23/2010  . History of colonic polyps 04/12/2010   Ruben Im, PT 10/18/16 5:16 PM Phone: [phone] Fax: [phone]  Alvera Singh 10/18/2016, 5:14 PM   [facility] [address] [place], [place], [address] Phone:  [phone]   Fax:  [phone]  Name: [name] [name] MRN: [id number] Date of Birth: 07/28/1955

## 2016-10-18 NOTE — Therapy (Signed)
[facility] [address], [place], [place], [address] Phone: [phone]   Fax:  [phone]  Physical Therapy Treatment  Patient Details  Name: [name] [name] MRN: [id number] Date of Birth: 26-Dec-1955 Referring Provider: Mechele Claude, [facility]  Encounter Date: 10/18/2016      PT End of Session - 10/18/16 1608    Visit Number 8   Date for PT Re-Evaluation 11/09/16   PT Start Time 7340   PT Stop Time 3709  discharge visit   PT Time Calculation (min) 32 min   Activity Tolerance Patient tolerated treatment well      Past Medical History:  Diagnosis Date  . Acute meniscal tear of knee    Left knee  . Alcohol problem drinking    rehab  . Anemia    menstrual related  . Anxiety   . Arthritis    right ankle-neuropathy  . Colon polyps   . Depression   . DM (diabetes mellitus), type 2 ([place]) 12/2009   type 2  . Evalluate for OSA (obstructive sleep apnea) 08/15/2013   No OSA on sleep study but may be underestimated.-never used cpap    . GERD (gastroesophageal reflux disease)    not currently taking.  Marland Kitchen Heart murmur   . History of recurrent UTIs   . Hyperlipidemia   . Hypertension   . Hypothyroidism   . Liver disease   . Neuromuscular disorder ([place])    neropathy bilateral feet.  . 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 Shortness of breath    with exertion   . Thyroid disease     Past Surgical History:  Procedure Laterality Date  . ADENOIDECTOMY    . BREAST SURGERY Right    lumpectomy-benign  . CESAREAN SECTION  1987  . CHOLECYSTECTOMY    . MASS EXCISION Left 12/16/2013   Procedure: EXCISION LEFT  DISTAL THIGH MASS;  Surgeon: Mauri Pole, MD;  Location: [facility];  Service: Orthopedics;  Laterality: Left;  . right ankle surgery      x 2- no retained hardware  . right rotator cuff surgery     . TONSILLECTOMY    . TOTAL KNEE ARTHROPLASTY  2009   right  . TOTAL KNEE  ARTHROPLASTY Left 05/05/2015   Procedure: LEFT TOTAL KNEE ARTHROPLASTY;  Surgeon: Paralee Cancel, MD;  Location: [facility];  Service: Orthopedics;  Laterality: Left;  . TOTAL KNEE REVISION Right 12/16/2013   Procedure: RIGHT TOTAL KNEE REVISION POLY EXCHANGE;  Surgeon: Mauri Pole, MD;  Location: [facility];  Service: Orthopedics;  Laterality: Right;  . TUBAL LIGATION  1987    There were no vitals filed for this visit.      Subjective Assessment - 10/18/16 1535    Subjective Going OK.   My right knee has been bothering me.  I don't know if it is from massage or my foot?  Also had a migraine.  The pain will increase to a 7/10 with walking on cement or tile floor.  Patient states she feels she can do exercises on her own now and expresses readiness for discharge.     Pertinent History Rt ankle ligament repair surgery 02/04/16.    How long can you walk comfortably? 1 block comfortably before foot pain   Patient Stated Goals normalize gait, reduce pain, improve strength and mobility of the ankle   Currently in Pain? Yes   Pain Score 4  Pain Location Ankle   Pain Orientation Left   Aggravating Factors  walking on cement or tile   Pain Relieving Factors ice            [facility] PT Assessment - 10/18/16 0001      Observation/Other Assessments   Focus on Therapeutic Outcomes (FOTO)  56% limitation                     [facility] Adult PT Treatment/Exercise - 10/18/16 0001      Knee/Hip Exercises: Aerobic   Nustep L2 x 10 min  review of pt status                  PT Short Term Goals - 10/18/16 1549      PT SHORT TERM GOAL #1   Title be independent in initial HEP   Status Achieved     PT SHORT TERM GOAL #2   Title report < or = to 6/10 Lt ankle pain with standing and walking   Status Achieved           PT Long Term Goals - 10/18/16 1549      PT LONG TERM GOAL #1   Title be independent in advanced HEP   Status Achieved     PT LONG TERM GOAL #2   Title  reduce FOTO to < or = to 47% limitation   Status Partially Met     PT LONG TERM GOAL #3   Title report < or = to 4/10 Lt ankle pain with standing and walking   Status Partially Met     PT LONG TERM GOAL #4   Title demonstrate symmetry with gait on level surfaces   Status Achieved     PT LONG TERM GOAL #5   Title demonstrate 4+/5 Lt ankle inversion and eversion to improve stability in the community   Status Achieved               Plan - 10/18/16 1547    Clinical Impression Statement Overall improvement in ankle is 70% better.  FOTO functional score improved from 62% to 56%.  The patient continues to have chronic pain in her knee and is also been diagnosed with plantar fascitis.  She has been instructed in a comprehensive HEP including exercises to strengthen foot intrinsic muscles and maintain her ROM (full and painless).  Her ankle strength has improved as well.  We have discussed her pes planus bilaterally and orthotic supports.  The patient expresses readiness for discharge from PT at this time to an independent HEP.     Rehab Potential Good   PT Frequency 2x / week   PT Duration 8 weeks   PT Treatment/Interventions ADLs/Self Care Home Management;Cryotherapy;Electrical Stimulation;Functional mobility training;Stair training;Gait training;Ultrasound;Moist Heat;Therapeutic activities;Therapeutic exercise;Neuromuscular re-education;Patient/family education;Passive range of motion;Scar mobilization;Manual techniques;Taping;Vasopneumatic Device;Dry needling;Iontophoresis 72m/ml Dexamethasone      Patient will benefit from skilled therapeutic intervention in order to improve the following deficits and impairments:  Abnormal gait, Difficulty walking, Pain, Increased edema, Decreased strength, Decreased balance, Impaired flexibility, Decreased activity tolerance  Visit Diagnosis: Other abnormalities of gait and mobility  Pain in left ankle and joints of left foot  Muscle weakness  (generalized)     Problem List Patient Active Problem List   Diagnosis Date Noted  . Sleep related headaches 09/07/2016  . Nightmares REM-sleep type 09/07/2016  . RLS (restless legs syndrome) 09/07/2016  . Snoring 09/07/2016  . Excessive daytime sleepiness 09/07/2016  .  Migraine 07/28/2016  . Solitary pulmonary nodule 06/08/2016  . S/P left TKA 05/05/2015  . S/P knee replacement 05/05/2015  . Former smoker 07/10/2014  . Exertional shortness of breath 06/07/2014  . Chest pain 06/07/2014  . Osteoarthritis 05/16/2014  . Alcoholic fatty liver 24/49/7530  . Chronic post-traumatic stress disorder (PTSD) 05/02/2014  . Sleep disorder 05/02/2014  . Family history of alcoholism 05/02/2014  . Macrocytosis 04/19/2014  . Fatigue 08/15/2013  . Thyroid nodule 09/17/2012  . Hypothyroid 07/21/2010  . Type II diabetes mellitus, well controlled ([place]) 06/23/2010  . HTN (hypertension) 06/23/2010  . Alcohol use disorder, severe, dependence ([place]) 06/23/2010  . Hyperlipidemia 06/23/2010  . Morbid obesity ([place]) 06/23/2010  . Chronic depression 06/23/2010  . History of colonic polyps 04/12/2010    Alvera Singh 10/18/2016, 5:13 PM  [facility] [address], [place], [place], [address] Phone: [phone]   Fax:  [phone]  Name: [name] [name] MRN: [id number] Date of Birth: [birth year]

## 2016-10-28 ENCOUNTER — Encounter: Payer: Self-pay | Admitting: Family Medicine

## 2016-10-28 DIAGNOSIS — I10 Essential (primary) hypertension: Secondary | ICD-10-CM

## 2016-10-28 DIAGNOSIS — E119 Type 2 diabetes mellitus without complications: Secondary | ICD-10-CM

## 2016-10-28 DIAGNOSIS — E039 Hypothyroidism, unspecified: Secondary | ICD-10-CM

## 2016-11-03 ENCOUNTER — Encounter: Payer: Self-pay | Admitting: Family Medicine

## 2016-11-10 NOTE — Telephone Encounter (Signed)
Dr. Yong Channel - Do you know which labs pt needs before next OV? I can't locate that information in her chart. Thanks!

## 2016-11-14 ENCOUNTER — Other Ambulatory Visit: Payer: Self-pay | Admitting: Family Medicine

## 2016-11-14 DIAGNOSIS — Z1231 Encounter for screening mammogram for malignant neoplasm of breast: Secondary | ICD-10-CM

## 2016-11-15 ENCOUNTER — Telehealth: Payer: Self-pay | Admitting: Neurology

## 2016-11-15 NOTE — Telephone Encounter (Signed)
Called and spoke with the patient and made her aware that the sleep study would need to be done here and follow up with Dr Dohmeier at that point. Her insurance doesn't cover the sleep study here so since that is the case I have informed her that she would need to get a referral from her PCP in order to get in with [facility] and their f/u MD. Pt also inquired about general neurology and I informed her that she would f/u with Jaynee Eagles and she said she would like to know what does she do about going to another neurologist and I informed her that she could be referred to [facility]. Pt verbalized understanding.

## 2016-11-15 NOTE — Telephone Encounter (Signed)
Patient called wanting to be transferred to another sleep lab for a sleep study because her insurance won't cover her here.  Could you please direct this patient.  [phone]

## 2016-11-19 ENCOUNTER — Other Ambulatory Visit: Payer: Self-pay | Admitting: Family Medicine

## 2016-11-19 LAB — CBC WITH DIFFERENTIAL/PLATELET
Basophils Absolute: 32 cells/uL (ref 0–200)
Basophils Relative: 0.6 %
Eosinophils Absolute: 49 cells/uL (ref 15–500)
Eosinophils Relative: 0.9 %
HCT: 39.6 % (ref 35.0–45.0)
Hemoglobin: 14 g/dL (ref 11.7–15.5)
Lymphs Abs: 2106 cells/uL (ref 850–3900)
MCH: 32.9 pg (ref 27.0–33.0)
MCHC: 35.4 g/dL (ref 32.0–36.0)
MCV: 93 fL (ref 80.0–100.0)
MPV: 8.7 fL (ref 7.5–12.5)
Monocytes Relative: 10 %
Neutro Abs: 2673 cells/uL (ref 1500–7800)
Neutrophils Relative %: 49.5 %
Platelets: 145 10*3/uL (ref 140–400)
RBC: 4.26 10*6/uL (ref 3.80–5.10)
RDW: 13 % (ref 11.0–15.0)
Total Lymphocyte: 39 %
WBC mixed population: 540 cells/uL (ref 200–950)
WBC: 5.4 10*3/uL (ref 3.8–10.8)

## 2016-11-19 LAB — COMPLETE METABOLIC PANEL WITH GFR
AG Ratio: 1.5 (calc) (ref 1.0–2.5)
ALT: 11 U/L (ref 6–29)
AST: 15 U/L (ref 10–35)
Albumin: 3.8 g/dL (ref 3.6–5.1)
Alkaline phosphatase (APISO): 73 U/L (ref 33–130)
BUN: 25 mg/dL (ref 7–25)
CO2: 25 mmol/L (ref 20–32)
Calcium: 9.6 mg/dL (ref 8.6–10.4)
Chloride: 108 mmol/L (ref 98–110)
Creat: 0.93 mg/dL (ref 0.50–0.99)
GFR, Est African American: 76 mL/min/{1.73_m2} (ref >=60)
GFR, Est Non African American: 66 mL/min/{1.73_m2} (ref >=60)
Globulin: 2.5 g/dL (calc) (ref 1.9–3.7)
Glucose, Bld: 107 mg/dL — ABNORMAL HIGH (ref 65–99)
Potassium: 4.3 mmol/L (ref 3.5–5.3)
Sodium: 142 mmol/L (ref 135–146)
Total Bilirubin: 1 mg/dL (ref 0.2–1.2)
Total Protein: 6.3 g/dL (ref 6.1–8.1)

## 2016-11-19 LAB — HEMOGLOBIN A1C
Hgb A1c MFr Bld: 5.2 % of total Hgb (ref <5.7)
Mean Plasma Glucose: 103 (calc)
eAG (mmol/L): 5.7 (calc)

## 2016-11-19 LAB — TSH: TSH: 1.14 mIU/L (ref 0.40–4.50)

## 2016-11-20 ENCOUNTER — Other Ambulatory Visit: Payer: Self-pay | Admitting: Family Medicine

## 2016-11-25 ENCOUNTER — Other Ambulatory Visit: Payer: Self-pay | Admitting: *Deleted

## 2016-11-25 MED ORDER — ESCITALOPRAM OXALATE 20 MG PO TABS: 20.0000 mg | ORAL_TABLET | Freq: Every day | ORAL | 0 refills | Status: DC

## 2016-11-25 MED ORDER — TRAZODONE HCL 50 MG PO TABS: ORAL_TABLET | ORAL | 0 refills | Status: DC

## 2016-11-28 ENCOUNTER — Ambulatory Visit (INDEPENDENT_AMBULATORY_CARE_PROVIDER_SITE_OTHER): Payer: No Typology Code available for payment source | Admitting: Family Medicine

## 2016-11-28 ENCOUNTER — Encounter: Payer: Self-pay | Admitting: Family Medicine

## 2016-11-28 ENCOUNTER — Other Ambulatory Visit: Payer: Self-pay | Admitting: Family Medicine

## 2016-11-28 VITALS — BP 120/70 | HR 70 | Temp 98.5°F | Ht 66.75 in | Wt 264.8 lb

## 2016-11-28 DIAGNOSIS — F1021 Alcohol dependence, in remission: Secondary | ICD-10-CM

## 2016-11-28 DIAGNOSIS — G43809 Other migraine, not intractable, without status migrainosus: Secondary | ICD-10-CM

## 2016-11-28 DIAGNOSIS — E039 Hypothyroidism, unspecified: Secondary | ICD-10-CM

## 2016-11-28 DIAGNOSIS — F32A Depression, unspecified: Secondary | ICD-10-CM

## 2016-11-28 DIAGNOSIS — L918 Other hypertrophic disorders of the skin: Secondary | ICD-10-CM | POA: Diagnosis not present

## 2016-11-28 DIAGNOSIS — Z23 Encounter for immunization: Secondary | ICD-10-CM

## 2016-11-28 DIAGNOSIS — E119 Type 2 diabetes mellitus without complications: Secondary | ICD-10-CM

## 2016-11-28 DIAGNOSIS — F329 Major depressive disorder, single episode, unspecified: Secondary | ICD-10-CM | POA: Diagnosis not present

## 2016-11-28 MED ORDER — FLURBIPROFEN 100 MG PO TABS: 100.0000 mg | ORAL_TABLET | Freq: Two times a day (BID) | ORAL | 2 refills | Status: DC | PRN

## 2016-11-28 NOTE — Assessment & Plan Note (Signed)
S: Lab Results  Component Value Date   TSH 1.14 11/18/2016   On thyroid medication-levothyroxine 88 mcg A/P: continue current medications

## 2016-11-28 NOTE — Assessment & Plan Note (Signed)
Neurology had prescriped keppra (used for headaches)- down to 3 a night, phenergan, flurbiprofen (rare). Will need new neurologist. Asks if I will prescribe meds. I am ok with flurbiprofen and phenergan but advised neurology appointment for keppra as I have not used this in the past.

## 2016-11-28 NOTE — Assessment & Plan Note (Signed)
S:  controlled. On janumet 50-1000mg  x2.  CBGs- 117 yesterday morning, 145 today. No low blood sugars Exercise and diet- weight is up with husband home- discussed reversing this Lab Results  Component Value Date   HGBA1C 5.2 11/18/2016   HGBA1C 5.1 08/22/2016   HGBA1C 5.3 05/17/2016   A/P: continue current medications.

## 2016-11-28 NOTE — Assessment & Plan Note (Signed)
2.5 years free from alcohol- encouraged her to continue to abstain.

## 2016-11-28 NOTE — Addendum Note (Signed)
Addended by: Lucianne Lei M on: 11/28/2016 03:53 PM   Modules accepted: Orders
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
## 2016-11-28 NOTE — Patient Instructions (Addendum)
Contact me a month before next visit- and we can hopefully write for labs then (waiting on new prescription pad). If not we will order labs after viist  For wounds we created today- use vaseline or neosporin and change bandaid 1-2x a day- definitely if gets soaked. See [place] back for expanding redness, worsening pain  Wt Readings from Last 3 Encounters:  11/28/16 264 lb 12.8 oz (120.1 kg)  09/07/16 257 lb 9.6 oz (116.8 kg)  08/29/16 252 lb (114.3 kg)  have to reverse this trend or diabetes will not stay well controlled  I need a copy of your last eye exam- or your next one- please have them fax that to [place]

## 2016-12-12 LAB — HM DIABETES EYE EXAM

## 2016-12-20 ENCOUNTER — Encounter: Payer: Self-pay | Admitting: Family Medicine

## 2016-12-22 ENCOUNTER — Other Ambulatory Visit: Payer: Self-pay | Admitting: Neurology

## 2016-12-29 ENCOUNTER — Other Ambulatory Visit: Payer: Self-pay

## 2016-12-29 MED ORDER — ESCITALOPRAM OXALATE 20 MG PO TABS: 20.0000 mg | ORAL_TABLET | Freq: Every day | ORAL | 0 refills | Status: DC

## 2016-12-30 ENCOUNTER — Ambulatory Visit (INDEPENDENT_AMBULATORY_CARE_PROVIDER_SITE_OTHER): Payer: PRIVATE HEALTH INSURANCE

## 2016-12-30 DIAGNOSIS — Z1231 Encounter for screening mammogram for malignant neoplasm of breast: Secondary | ICD-10-CM

## 2017-01-02 ENCOUNTER — Telehealth: Payer: Self-pay | Admitting: Family Medicine

## 2017-01-02 ENCOUNTER — Other Ambulatory Visit: Payer: Self-pay

## 2017-01-02 MED ORDER — FLURBIPROFEN 100 MG PO TABS: 100.0000 mg | ORAL_TABLET | Freq: Two times a day (BID) | ORAL | 2 refills | Status: DC | PRN

## 2017-01-02 NOTE — Telephone Encounter (Signed)
Pharmacy called in reference to sending over fax on 11/7 and 11/9 for flurbiprofen (ANSAID) 100 MG tablet for patient. Pharmacy wanted to confirm that fax was received. Please advise.

## 2017-01-03 NOTE — Telephone Encounter (Signed)
Called and spoke to pharmacy that confirmed they received the prescription yesterday and they have filled it.

## 2017-01-19 ENCOUNTER — Other Ambulatory Visit: Payer: Self-pay

## 2017-01-19 MED ORDER — TRAZODONE HCL 50 MG PO TABS: ORAL_TABLET | ORAL | 0 refills | Status: DC

## 2017-02-08 ENCOUNTER — Other Ambulatory Visit: Payer: Self-pay | Admitting: Family Medicine

## 2017-02-08 ENCOUNTER — Telehealth: Payer: Self-pay | Admitting: Family Medicine

## 2017-02-08 NOTE — Telephone Encounter (Signed)
Pharmacy notifying provider that patient usually takes mylan brand of levothyroxine 88 MCG and they had to change it to the lannett brand. Please check labs if you deem necessary.

## 2017-02-09 NOTE — Telephone Encounter (Signed)
Have her come in for repeat tsh alone under hypothyroidism in 6 weeks (from change)

## 2017-02-17 NOTE — Telephone Encounter (Signed)
Spoke to [name] [name] who states that she has picked up her prescription from the pharmacy but that she is leaving for [place] next week and won't be back to [place] until the end of April. She did state she could have her levels checked when she returned. I advised her if she started feeling bad while in [place] to let us know. She verbalized understanding

## 2017-02-17 NOTE — Telephone Encounter (Signed)
Great advice- thanks

## 2017-03-16 ENCOUNTER — Encounter: Payer: Self-pay | Admitting: Family Medicine

## 2017-03-25 ENCOUNTER — Other Ambulatory Visit: Payer: Self-pay | Admitting: Neurology

## 2017-04-03 ENCOUNTER — Telehealth: Payer: Self-pay | Admitting: Neurology

## 2017-04-03 NOTE — Telephone Encounter (Signed)
Called the patient to discuss that we are getting refill request for her keppra. When we last spoke in September I was under the impression that the patient was going to a different neurologist since we were not covered in her insurance. LVM for the patient to call back.

## 2017-04-04 NOTE — Telephone Encounter (Signed)
Patient called stating she does not want Keppra filled and she is not going to any neurologist. A returned call is not needed unless there are questions.

## 2017-04-10 ENCOUNTER — Other Ambulatory Visit: Payer: Self-pay

## 2017-04-10 MED ORDER — BUPROPION HCL ER (XL) 150 MG PO TB24: 150.0000 mg | ORAL_TABLET | Freq: Every day | ORAL | 1 refills | Status: DC

## 2017-04-21 ENCOUNTER — Other Ambulatory Visit: Payer: Self-pay

## 2017-04-21 MED ORDER — SIMVASTATIN 20 MG PO TABS: 20.0000 mg | ORAL_TABLET | Freq: Every day | ORAL | 3 refills | Status: DC

## 2017-04-25 ENCOUNTER — Other Ambulatory Visit: Payer: Self-pay | Admitting: Family Medicine

## 2017-04-27 NOTE — Telephone Encounter (Signed)
Called and spoke with patient who is currently in [place] until the middle of April. She does not need refills at this time

## 2017-05-05 ENCOUNTER — Encounter: Payer: Self-pay | Admitting: Family Medicine

## 2017-05-15 ENCOUNTER — Other Ambulatory Visit: Payer: Self-pay

## 2017-05-15 DIAGNOSIS — E039 Hypothyroidism, unspecified: Secondary | ICD-10-CM

## 2017-05-15 MED ORDER — LEVOTHYROXINE SODIUM 88 MCG PO TABS: 88.0000 ug | ORAL_TABLET | Freq: Every day | ORAL | 3 refills | Status: DC

## 2017-06-12 ENCOUNTER — Other Ambulatory Visit: Payer: Self-pay

## 2017-06-12 MED ORDER — TRAZODONE HCL 50 MG PO TABS: ORAL_TABLET | ORAL | 0 refills | Status: DC

## 2017-06-12 MED ORDER — SITAGLIPTIN-METFORMIN HCL ER 50-1000 MG PO TB24
2.0000 | ORAL_TABLET | Freq: Every day | ORAL | 3 refills | Status: DC
Start: 2017-06-12 — End: 2018-05-21

## 2017-06-21 LAB — BASIC METABOLIC PANEL
BUN: 16 (ref 4–21)
Creatinine: 1 (ref 0.5–1.1)
Glucose: 137
Potassium: 4.6 (ref 3.4–5.3)
Sodium: 140 (ref 137–147)

## 2017-06-21 LAB — LIPID PANEL
Cholesterol: 123 (ref 0–200)
HDL: 61 (ref 35–70)
LDL Cholesterol: 48
Triglycerides: 59 (ref 40–160)

## 2017-06-21 LAB — HEMOGLOBIN A1C: Hemoglobin A1C: 5.8

## 2017-06-21 LAB — TSH: TSH: 0.92 (ref 0.41–5.90)

## 2017-06-27 ENCOUNTER — Encounter: Payer: Self-pay | Admitting: Family Medicine

## 2017-06-28 ENCOUNTER — Encounter: Payer: Self-pay | Admitting: Family Medicine

## 2017-06-28 ENCOUNTER — Ambulatory Visit: Payer: No Typology Code available for payment source | Admitting: Family Medicine

## 2017-06-28 VITALS — BP 132/70 | HR 73 | Temp 98.4°F | Ht 66.75 in | Wt 269.6 lb

## 2017-06-28 DIAGNOSIS — R911 Solitary pulmonary nodule: Secondary | ICD-10-CM

## 2017-06-28 DIAGNOSIS — F329 Major depressive disorder, single episode, unspecified: Secondary | ICD-10-CM

## 2017-06-28 DIAGNOSIS — R0602 Shortness of breath: Secondary | ICD-10-CM | POA: Diagnosis not present

## 2017-06-28 DIAGNOSIS — E785 Hyperlipidemia, unspecified: Secondary | ICD-10-CM

## 2017-06-28 DIAGNOSIS — E039 Hypothyroidism, unspecified: Secondary | ICD-10-CM

## 2017-06-28 DIAGNOSIS — F1021 Alcohol dependence, in remission: Secondary | ICD-10-CM

## 2017-06-28 DIAGNOSIS — G43809 Other migraine, not intractable, without status migrainosus: Secondary | ICD-10-CM

## 2017-06-28 DIAGNOSIS — F32A Depression, unspecified: Secondary | ICD-10-CM

## 2017-06-28 DIAGNOSIS — E119 Type 2 diabetes mellitus without complications: Secondary | ICD-10-CM | POA: Diagnosis not present

## 2017-06-28 DIAGNOSIS — R9431 Abnormal electrocardiogram [ECG] [EKG]: Secondary | ICD-10-CM | POA: Diagnosis not present

## 2017-06-28 NOTE — Assessment & Plan Note (Signed)
S:  controlled on Janumet 50-1000 mg XR  two tablets once a day CBGs- fasting blood sugars usually around 80.  Nonfasting's have ranged from 55-2 11.  We discussed a low of 55 - missed a meal- encouraged her to eat regular meals Lab Results  Component Value Date   HGBA1C 5.8 06/21/2017   HGBA1C 5.2 11/18/2016   HGBA1C 5.1 08/22/2016   A/P: Continue current medications.  A1c is well controlled

## 2017-06-28 NOTE — Assessment & Plan Note (Signed)
S: Depression and anxiety have been well controlled on Wellbutrin 150 mg extended release, Lexapro 20 mg.  She has also found CBD oil helpful as of last visit.  Also on trazodone for sleep  Last visit her GAD 7 was 4 and PHQ 9 was 8 but no anhedonia or depression.  She asked about reducing medicine but I advised holding where scores were.  Today, PHQ 9 of 9 but no anhedonia or depressed mood GAD-7 of 0 A/P: Continue current medications. She also finds CBD oil helpful

## 2017-06-28 NOTE — Assessment & Plan Note (Addendum)
S: Now 3 years free from alcohol. Still doing AA A/P: Encourage continued avoidance of alcohol.  She is doing great

## 2017-06-28 NOTE — Progress Notes (Signed)
Subjective:  [name] [name] is a 62 y.o. year old very pleasant female patient who presents for/with See problem oriented charting ROS- some fatigue. No chest pain or shortness of breath. No edema. Some easy bruising.    Past Medical History-  Patient Active Problem List   Diagnosis Date Noted  . Solitary pulmonary nodule 06/08/2016    Priority: High  . Alcoholic fatty liver 66/07/3014    Priority: High  . Type II diabetes mellitus, well controlled ([place]) 06/23/2010    Priority: High  . Alcohol use disorder, severe, in sustained remission ([place]) 06/23/2010    Priority: High  . Morbid obesity ([place]) 06/23/2010    Priority: High  . Chronic depression 06/23/2010    Priority: High  . Migraine 07/28/2016    Priority: Medium  . Hypothyroid 07/21/2010    Priority: Medium  . HTN (hypertension) 06/23/2010    Priority: Medium  . Hyperlipidemia 06/23/2010    Priority: Medium  . Former smoker 07/10/2014    Priority: Low  . Osteoarthritis 05/16/2014    Priority: Low  . Chronic post-traumatic stress disorder (PTSD) 05/02/2014    Priority: Low  . Sleep disorder 05/02/2014    Priority: Low  . Family history of alcoholism 05/02/2014    Priority: Low  . Macrocytosis 04/19/2014    Priority: Low  . Fatigue 08/15/2013    Priority: Low  . Thyroid nodule 09/17/2012    Priority: Low  . History of colonic polyps 04/12/2010    Priority: Low  . Sleep related headaches 09/07/2016  . Nightmares REM-sleep type 09/07/2016  . RLS (restless legs syndrome) 09/07/2016  . Snoring 09/07/2016  . Excessive daytime sleepiness 09/07/2016  . S/P left TKA 05/05/2015  . S/P knee replacement 05/05/2015  . Shortness of breath 06/07/2014  . Chest pain 06/07/2014    Medications- reviewed and updated Current Outpatient Medications  Medication Sig Dispense Refill  . ACCU-CHEK FASTCLIX LANCETS MISC Use to test blood sugars daily. Dx: E11.9 100 each 11  . buPROPion (WELLBUTRIN XL) 150 MG 24 hr tablet Take 1  tablet (150 mg total) by mouth daily. 90 tablet 1  . escitalopram (LEXAPRO) 20 MG tablet Take 1 tablet (20 mg total) daily by mouth. 90 tablet 0  . flurbiprofen (ANSAID) 100 MG tablet Take 1 tablet (100 mg total) 2 (two) times daily as needed by mouth. 60 tablet 2  . glucose blood (ONETOUCH VERIO) test strip USE TO CHECK BLOOD SUGAR DAILY AND PRN 100 each 12  . levothyroxine (SYNTHROID, LEVOTHROID) 88 MCG tablet Take 1 tablet (88 mcg total) by mouth daily. 90 tablet 3  . PRESCRIPTION MEDICATION Lidocaine 4% Nasal Spray as needed    . simvastatin (ZOCOR) 20 MG tablet Take 1 tablet (20 mg total) by mouth at bedtime. 90 tablet 3  . SitaGLIPtin-MetFORMIN HCl (JANUMET XR) 50-1000 MG TB24 Take 2 tablets by mouth daily. 180 tablet 3  . traZODone (DESYREL) 50 MG tablet TAKE 1 TABLET AT BEDTIME ASNEEDED, MAY REPEAT 1 TIME  IF NEEDED FOR SLEEP 90 tablet 1  . traZODone (DESYREL) 50 MG tablet TAKE 1 TABLET AT BEDTIME AS NEEDED,MAY REPEAT 1 TIME IF NEEDED FOR SLEEP 90 tablet 0     Objective: BP 132/70 (BP Location: Left Arm, Patient Position: Sitting, Cuff Size: Normal)   Pulse 73   Temp 98.4 F (36.9 C) (Oral)   Ht 5' 6.75" (1.695 m)   Wt 269 lb 9.6 oz (122.3 kg)   SpO2 96%   BMI 42.54 kg/m  Gen: NAD, resting comfortably CV: RRR no murmurs rubs or gallops Lungs: CTAB no crackles, wheeze, rhonchi Abdomen: soft/nontender/nondistended/normal bowel sounds. No rebound or guarding.  Ext: 1+ edema stable Skin: warm, dry Neuro: grossly normal, moves all extremities  Diabetic Foot Exam - Simple   Simple Foot Form Diabetic Foot exam was performed with the following findings:  Yes 06/28/2017 11:00 AM  Visual Inspection No deformities, no ulcerations, no other skin breakdown bilaterally:  Yes Sensation Testing Intact to touch and monofilament testing bilaterally:  Yes Pulse Check Posterior Tibialis and Dorsalis pulse intact bilaterally:  Yes Comments    Assessment/Plan:  Type II diabetes mellitus,  well controlled ([facility]) S:  controlled on Janumet 50-1000 mg XR  two tablets once a day CBGs- fasting blood sugars usually around 80.  Nonfasting's have ranged from 55-2 11.  We discussed a low of 55 - missed a meal- encouraged her to eat regular meals Lab Results  Component Value Date   HGBA1C 5.8 06/21/2017   HGBA1C 5.2 11/18/2016   HGBA1C 5.1 08/22/2016   A/P: Continue current medications.  A1c is well controlled  Hypothyroid S: On thyroid medication-levothyroxine 88 mcg.  Has had some increased fatigue over last few months but TSH is okay Lab Results  Component Value Date   TSH 1.14 11/18/2016  A/P: Doing well on current dose of medication-continue. Will abstract value 0.92 from labs last week  Hyperlipidemia S:  controlled on simvastatin 20 mg Lab Results  Component Value Date   CHOL 123 06/21/2017   HDL 61 06/21/2017   LDLCALC 48 06/21/2017   LDLDIRECT 158.6 02/26/2014   TRIG 59 06/21/2017   CHOLHDL 3 05/17/2016   A/P: LDL very well controlled under 70.  Continue current medication  Chronic depression S: Depression and anxiety have been well controlled on Wellbutrin 150 mg extended release, Lexapro 20 mg.  She has also found CBD oil helpful as of last visit.  Also on trazodone for sleep  Last visit her GAD 7 was 4 and PHQ 9 was 8 but no anhedonia or depression.  She asked about reducing medicine but I advised holding where scores were.  Today, PHQ 9 of 9 but no anhedonia or depressed mood GAD-7 of 0 A/P: Continue current medications. She also finds CBD oil helpful  Migraine S: I encouraged her to find a new neurologist last visit-she has to search given the complexity of her health plan.  I tried to help her by prescribing Phenergan and flurbiprofen-but she was going to see neurology to help with Keppra.   She has found CBD oil very helpful. She is off the [place] and only having migraines less than once a month. She is no longer using phenergan or flurbiprofen either.    Even with mild headache - doesn't feel she needs medicine A/P: continue current meds. She is also taking CBD oil on her own and finds helpful.   Alcohol use disorder, severe, in sustained remission ([place]) S: Now 3 years free from alcohol. Still doing AA A/P: Encourage continued avoidance of alcohol.  She is doing great  Shortness of breath S: quit smoking 2011 after a year 1.5 packs- prior quit 1985 and smoked 17 years 1.5 packs= over 30 pack years.   CT 05/2016 showed pulmonary nodule. Feels shortness of breath is getting worse with weight gain. Stable edema. Had echocardiogram 11/2013 which was essentially normal. Had exercise stress test 07/09/14 with Dr. Claiborne Billings she was not able to complete this due to blood pressure  response and shortness of breath and her insurance did not cover IV medication to simulate exercise. Was thought deconditioning related  She feels like shortness of breath has been getting worse over the years.  She has become less active with knee pain and now even just walking sometimes will cause shortness of breath. A/P: I think with her history of pulmonary nodule we need to start with another CT scan.  May consider pulmonology referral .  After that could consider cardiology follow-up given incomplete stress test last time  4 month follow up  Lab/Order associations: Solitary pulmonary nodule - Plan: CT Chest W Contrast  Shortness of breath - Plan: CT Chest W Contrast  Return precautions advised.  [name], MD

## 2017-06-28 NOTE — Assessment & Plan Note (Signed)
S:  controlled on simvastatin 20 mg Lab Results  Component Value Date   CHOL 123 06/21/2017   HDL 61 06/21/2017   LDLCALC 48 06/21/2017   LDLDIRECT 158.6 02/26/2014   TRIG 59 06/21/2017   CHOLHDL 3 05/17/2016   A/P: LDL very well controlled under 70.  Continue current medication

## 2017-06-28 NOTE — Assessment & Plan Note (Signed)
S: quit smoking 2011 after a year 1.5 packs- prior quit 1985 and smoked 17 years 1.5 packs= over 30 pack years.   CT 05/2016 showed pulmonary nodule. Feels shortness of breath is getting worse with weight gain. Stable edema. Had echocardiogram 11/2013 which was essentially normal. Had exercise stress test 07/09/14 with Dr. Claiborne Billings she was not able to complete this due to blood pressure response and shortness of breath and her insurance did not cover IV medication to simulate exercise. Was thought deconditioning related  She feels like shortness of breath has been getting worse over the years.  She has become less active with knee pain and now even just walking sometimes will cause shortness of breath. A/P: I think with her history of pulmonary nodule we need to start with another CT scan.  May consider pulmonology referral .  After that could consider cardiology follow-up given incomplete stress test last time

## 2017-06-28 NOTE — Patient Instructions (Addendum)
Health Maintenance Due  Topic Date Due  . HEMOGLOBIN A1C - 06/21/17 5.8 05/18/2017  . FOOT EXAM - done today 05/24/2017   We will call you within a week or two about your referral to CT scan of lungs for shortness of breath and to follow up on your nodule. If you do not hear within 3 weeks, give [place] a call.

## 2017-06-28 NOTE — Assessment & Plan Note (Signed)
S: On thyroid medication-levothyroxine 88 mcg.  Has had some increased fatigue over last few months but TSH is okay Lab Results  Component Value Date   TSH 1.14 11/18/2016  A/P: Doing well on current dose of medication-continue. Will abstract value 0.92 from labs last week

## 2017-06-28 NOTE — Assessment & Plan Note (Signed)
S: I encouraged her to find a new neurologist last visit-she has to search given the complexity of her health plan.  I tried to help her by prescribing Phenergan and flurbiprofen-but she was going to see neurology to help with Keppra.   She has found CBD oil very helpful. She is off the [place] and only having migraines less than once a month. She is no longer using phenergan or flurbiprofen either.   Even with mild headache - doesn't feel she needs medicine A/P: continue current meds. She is also taking CBD oil on her own and finds helpful.

## 2017-07-06 DIAGNOSIS — M67979 Unspecified disorder of synovium and tendon, unspecified ankle and foot: Secondary | ICD-10-CM | POA: Insufficient documentation

## 2017-07-07 ENCOUNTER — Encounter: Payer: Self-pay | Admitting: Family Medicine

## 2017-07-07 ENCOUNTER — Telehealth: Payer: Self-pay

## 2017-07-07 NOTE — Telephone Encounter (Signed)
Aria, please make sure Dr. Yong Channel sees this.

## 2017-07-07 NOTE — Telephone Encounter (Signed)
Called and spoke to patient about her symptoms of UTI. I tried to get her into the office this afternoon but everyone schedule was booked. She stated she could wait until Monday. I scheduled her for a appointment Monday afternoon.

## 2017-07-10 ENCOUNTER — Encounter: Payer: Self-pay | Admitting: Family Medicine

## 2017-07-10 ENCOUNTER — Ambulatory Visit: Payer: No Typology Code available for payment source | Admitting: Family Medicine

## 2017-07-10 VITALS — BP 130/70 | HR 87 | Temp 98.9°F | Ht 66.75 in | Wt 268.6 lb

## 2017-07-10 DIAGNOSIS — R300 Dysuria: Secondary | ICD-10-CM

## 2017-07-10 DIAGNOSIS — E119 Type 2 diabetes mellitus without complications: Secondary | ICD-10-CM

## 2017-07-10 LAB — POCT URINALYSIS DIPSTICK
Bilirubin, UA: NEGATIVE
Blood, UA: NEGATIVE
Glucose, UA: NEGATIVE
Ketones, UA: NEGATIVE
Leukocytes, UA: NEGATIVE
Nitrite, UA: NEGATIVE
Protein, UA: NEGATIVE
Spec Grav, UA: 1.025 (ref 1.010–1.025)
Urobilinogen, UA: 0.2 E.U./dL
pH, UA: 6 (ref 5.0–8.0)

## 2017-07-10 MED ORDER — GLIMEPIRIDE 2 MG PO TABS: 2.0000 mg | ORAL_TABLET | Freq: Every day | ORAL | 2 refills | Status: DC

## 2017-07-10 MED ORDER — ESCITALOPRAM OXALATE 20 MG PO TABS: 20.0000 mg | ORAL_TABLET | Freq: Every day | ORAL | 2 refills | Status: DC

## 2017-07-10 NOTE — Patient Instructions (Addendum)
Initial urine test reassuring. Possible UTI. Will get culture. Empiric treatment - will hold off for culture Patient to follow up if new or worsening symptoms or failure to improve.   Get labs about a week before you come see me. Printed script today  We are getting with our referral coordinator to get an update for you on the CT. Let us know if you havent heard by end of week- mychart is ok for this.

## 2017-07-10 NOTE — Progress Notes (Signed)
Subjective:  [name] [name] is a 62 y.o. year old very pleasant female patient who presents for/with See problem oriented charting ROS- see ros embedded below subjective below  Past Medical History-  Patient Active Problem List   Diagnosis Date Noted  . Solitary pulmonary nodule 06/08/2016    Priority: High  . Alcoholic fatty liver 69/62/9528    Priority: High  . Type II diabetes mellitus, well controlled ([place]) 06/23/2010    Priority: High  . Alcohol use disorder, severe, in sustained remission ([place]) 06/23/2010    Priority: High  . Morbid obesity ([place]) 06/23/2010    Priority: High  . Chronic depression 06/23/2010    Priority: High  . Migraine 07/28/2016    Priority: Medium  . Hypothyroid 07/21/2010    Priority: Medium  . HTN (hypertension) 06/23/2010    Priority: Medium  . Hyperlipidemia 06/23/2010    Priority: Medium  . Former smoker 07/10/2014    Priority: Low  . Osteoarthritis 05/16/2014    Priority: Low  . Chronic post-traumatic stress disorder (PTSD) 05/02/2014    Priority: Low  . Sleep disorder 05/02/2014    Priority: Low  . Family history of alcoholism 05/02/2014    Priority: Low  . Macrocytosis 04/19/2014    Priority: Low  . Fatigue 08/15/2013    Priority: Low  . Thyroid nodule 09/17/2012    Priority: Low  . History of colonic polyps 04/12/2010    Priority: Low  . Sleep related headaches 09/07/2016  . Nightmares REM-sleep type 09/07/2016  . RLS (restless legs syndrome) 09/07/2016  . Snoring 09/07/2016  . Excessive daytime sleepiness 09/07/2016  . S/P left TKA 05/05/2015  . S/P knee replacement 05/05/2015  . Shortness of breath 06/07/2014  . Chest pain 06/07/2014    Medications- reviewed and updated Current Outpatient Medications  Medication Sig Dispense Refill  . ACCU-CHEK FASTCLIX LANCETS MISC Use to test blood sugars daily. Dx: E11.9 100 each 11  . buPROPion (WELLBUTRIN XL) 150 MG 24 hr tablet Take 1 tablet (150 mg total) by mouth daily. 90 tablet  1  . escitalopram (LEXAPRO) 20 MG tablet Take 1 tablet (20 mg total) by mouth daily. 90 tablet 2  . glimepiride (AMARYL) 2 MG tablet Take 1 tablet (2 mg total) by mouth daily. 90 tablet 2  . glucose blood (ONETOUCH VERIO) test strip USE TO CHECK BLOOD SUGAR DAILY AND PRN 100 each 12  . levothyroxine (SYNTHROID, LEVOTHROID) 88 MCG tablet Take 1 tablet (88 mcg total) by mouth daily. 90 tablet 3  . PRESCRIPTION MEDICATION Lidocaine 4% Nasal Spray as needed    . simvastatin (ZOCOR) 20 MG tablet Take 1 tablet (20 mg total) by mouth at bedtime. 90 tablet 3  . SitaGLIPtin-MetFORMIN HCl (JANUMET XR) 50-1000 MG TB24 Take 2 tablets by mouth daily. 180 tablet 3  . traZODone (DESYREL) 50 MG tablet TAKE 1 TABLET AT BEDTIME ASNEEDED, MAY REPEAT 1 TIME  IF NEEDED FOR SLEEP 90 tablet 1   No current facility-administered medications for this visit.     Objective: BP 130/70 (BP Location: Left Arm, Patient Position: Sitting, Cuff Size: Normal)   Pulse 87   Temp 98.9 F (37.2 C) (Oral)   Ht 5' 6.75" (1.695 m)   Wt 268 lb 9.6 oz (121.8 kg)   SpO2 93%   BMI 42.38 kg/m  Gen: NAD, resting comfortably CV: RRR no murmurs rubs or gallops Lungs: CTAB no crackles, wheeze, rhonchi Abdomen: soft/nontender/nondistended/normal bowel sounds. No cva tenderness either  Ext: no edema Skin:  warm, dry  Assessment/Plan:  Concern for UTI S: Patients symptoms started Friday.  Started with cloudy urine. Has had a few sharp suprapubic pains Complains of dysuria: yes; polyuria: yes; nocturia: yes; urgency: no.  Symptoms are slightly better today. Got over the counter AZO and helped some. Did home test for UTI which showed positive (did test before taking AZO)  ROS- no fever, chills, nausea, vomiting, increased flank pain. No blood in urine- other than one day with wiping.  A/P: UA reassuring. Possible UTI. Will get culture. Empiric treatment - will hold off for culture Patient to follow up if new or worsening symptoms or  failure to improve.   We also went ahead and wrote out labs for her to complete a week before her next visit with me in september  Future Appointments  Date Time Provider [place]  11/13/2017  3:30 PM Marin Olp, MD [facility]   No follow-ups on file.  Lab/Order associations: Dysuria - Plan: Urine Culture, POCT urinalysis dipstick  Type II diabetes mellitus, well controlled ([place])    Refills provided for chronic medications Meds ordered this encounter  Medications  . glimepiride (AMARYL) 2 MG tablet    Sig: Take 1 tablet (2 mg total) by mouth daily.    Dispense:  90 tablet    Refill:  2  . escitalopram (LEXAPRO) 20 MG tablet    Sig: Take 1 tablet (20 mg total) by mouth daily.    Dispense:  90 tablet    Refill:  2    Return precautions advised.  [name], MD

## 2017-07-12 LAB — URINE CULTURE
MICRO NUMBER:: 90610562
SPECIMEN QUALITY:: ADEQUATE

## 2017-07-18 ENCOUNTER — Ambulatory Visit: Payer: No Typology Code available for payment source | Admitting: Family Medicine

## 2017-07-18 ENCOUNTER — Encounter: Payer: Self-pay | Admitting: Family Medicine

## 2017-07-18 VITALS — BP 118/60 | HR 81 | Temp 98.9°F | Ht 66.75 in | Wt 268.4 lb

## 2017-07-18 DIAGNOSIS — R319 Hematuria, unspecified: Secondary | ICD-10-CM

## 2017-07-18 DIAGNOSIS — R350 Frequency of micturition: Secondary | ICD-10-CM

## 2017-07-18 LAB — POC URINALSYSI DIPSTICK (AUTOMATED)
Bilirubin, UA: NEGATIVE
Blood, UA: NEGATIVE
Glucose, UA: NEGATIVE
Ketones, UA: NEGATIVE
Leukocytes, UA: NEGATIVE
Nitrite, UA: NEGATIVE
Protein, UA: NEGATIVE
Spec Grav, UA: >=1.03 — AB (ref 1.010–1.025)
Urobilinogen, UA: 0.2 E.U./dL
pH, UA: 5.5 (ref 5.0–8.0)

## 2017-07-18 NOTE — Patient Instructions (Addendum)
Lets await urine culture. Initial urine was fine today though.   You wanted to go to urology- just let me know which location through mychart --> lets refer you if this is in fact another UTI  Otherwise lets try increased water intake first before seeing urology. I would target at least 40 oz a day if not 50   I have ordered a future urine. Please let the clinical staff know that you are  Coming in to drop this off if you come by so we will know why we have a urine result/sample

## 2017-07-18 NOTE — Progress Notes (Addendum)
Subjective:  [name] [name] is a 62 y.o. year old very pleasant female patient who presents for/with See problem oriented charting ROS- no fever or chills. Has had back pain and urinary urgency.    Past Medical History-  Patient Active Problem List   Diagnosis Date Noted  . Solitary pulmonary nodule 06/08/2016    Priority: High  . Alcoholic fatty liver 72/53/6644    Priority: High  . Type II diabetes mellitus, well controlled ([place]) 06/23/2010    Priority: High  . Alcohol use disorder, severe, in sustained remission ([place]) 06/23/2010    Priority: High  . Morbid obesity ([place]) 06/23/2010    Priority: High  . Chronic depression 06/23/2010    Priority: High  . Migraine 07/28/2016    Priority: Medium  . Hypothyroid 07/21/2010    Priority: Medium  . HTN (hypertension) 06/23/2010    Priority: Medium  . Hyperlipidemia 06/23/2010    Priority: Medium  . Former smoker 07/10/2014    Priority: Low  . Osteoarthritis 05/16/2014    Priority: Low  . Chronic post-traumatic stress disorder (PTSD) 05/02/2014    Priority: Low  . Sleep disorder 05/02/2014    Priority: Low  . Family history of alcoholism 05/02/2014    Priority: Low  . Macrocytosis 04/19/2014    Priority: Low  . Fatigue 08/15/2013    Priority: Low  . Thyroid nodule 09/17/2012    Priority: Low  . History of colonic polyps 04/12/2010    Priority: Low  . Sleep related headaches 09/07/2016  . Nightmares REM-sleep type 09/07/2016  . RLS (restless legs syndrome) 09/07/2016  . Snoring 09/07/2016  . Excessive daytime sleepiness 09/07/2016  . S/P left TKA 05/05/2015  . S/P knee replacement 05/05/2015  . Shortness of breath 06/07/2014  . Chest pain 06/07/2014    Medications- reviewed and updated Current Outpatient Medications  Medication Sig Dispense Refill  . ACCU-CHEK FASTCLIX LANCETS MISC Use to test blood sugars daily. Dx: E11.9 100 each 11  . buPROPion (WELLBUTRIN XL) 150 MG 24 hr tablet Take 1 tablet (150 mg total) by  mouth daily. 90 tablet 1  . escitalopram (LEXAPRO) 20 MG tablet Take 1 tablet (20 mg total) by mouth daily. 90 tablet 2  . glimepiride (AMARYL) 2 MG tablet Take 1 tablet (2 mg total) by mouth daily. 90 tablet 2  . glucose blood (ONETOUCH VERIO) test strip USE TO CHECK BLOOD SUGAR DAILY AND PRN 100 each 12  . levothyroxine (SYNTHROID, LEVOTHROID) 88 MCG tablet Take 1 tablet (88 mcg total) by mouth daily. 90 tablet 3  . PRESCRIPTION MEDICATION Lidocaine 4% Nasal Spray as needed    . simvastatin (ZOCOR) 20 MG tablet Take 1 tablet (20 mg total) by mouth at bedtime. 90 tablet 3  . SitaGLIPtin-MetFORMIN HCl (JANUMET XR) 50-1000 MG TB24 Take 2 tablets by mouth daily. 180 tablet 3  . traZODone (DESYREL) 50 MG tablet TAKE 1 TABLET AT BEDTIME ASNEEDED, MAY REPEAT 1 TIME  IF NEEDED FOR SLEEP 90 tablet 1   No current facility-administered medications for this visit.     Objective: BP 118/60 (BP Location: Left Arm, Patient Position: Sitting, Cuff Size: Large)   Pulse 81   Temp 98.9 F (37.2 C) (Oral)   Ht 5' 6.75" (1.695 m)   Wt 268 lb 6.4 oz (121.7 kg)   SpO2 95%   BMI 42.35 kg/m  Gen: NAD, resting comfortably CV: RRR no murmurs rubs or gallops Lungs: CTAB no crackles, wheeze, rhonchi Abdomen: soft/nontender/nondistended/normal bowel sounds. No rebound  or guarding. No suprapubic pain.  Ext: no edema Skin: warm, dry  Results for orders placed or performed in visit on 07/18/17 (from the past 24 hour(s))  POCT Urinalysis Dipstick (Automated)     Status: Abnormal   Collection Time: 07/18/17  4:08 PM  Result Value Ref Range   Color, UA Amber    Clarity, UA Clear    Glucose, UA Negative Negative   Bilirubin, UA Negative    Ketones, UA Negative    Spec Grav, UA >=1.030 (A) 1.010 - 1.025   Blood, UA Negative    pH, UA 5.5 5.0 - 8.0   Protein, UA Negative Negative   Urobilinogen, UA 0.2 0.2 or 1.0 E.U./dL   Nitrite, UA Negative    Leukocytes, UA Negative Negative     Assessment/Plan:  Urinary frequency - Plan: POCT Urinalysis Dipstick (Automated), Urine Culture, POCT Urinalysis Dipstick (Automated) S: eight days ago we patient saw for potential UTI symptoms - her urine culture did not grow out clear UTI. Symptoms started back 5 days ago with urgency, tingling sensation, some low back pain, blood with wiping after peeing  Monday- took one dose of azo but none sense that time- helped symptoms  January and March UTI noted while [place] on [place]- we have one of the reports scanned in. She also had UTI back last July with E. Coli which was pan sensitive A/P: Patient would like to see urology- particularly if urine culture grows out infection today as that would be 3 UTI within 6 months. Even if culture negative- she may want their opinion to see what cause of her symptoms are. She reports blood with wiping but no clear blood in actual urine- if we see blood in urine would also refer.   She checked with her insurance and apparently the only urologist in town that is covered for her plan is Dr. Irine Seal with [facility]. Once again- we will await urine culture for now.    Future Appointments  Date Time Provider [place]  11/13/2017  3:30 PM Marin Olp, MD [facility]   Lab/Order associations: Urinary frequency - Plan: POCT Urinalysis Dipstick (Automated), Urine Culture, POCT Urinalysis Dipstick (Automated)  Hematuria, unspecified type - Plan: POCT Urinalysis Dipstick (Automated), Urine Culture  Return precautions advised.  [name], MD   Addendum on 07/20/2017:  ISOLATE 1: Escherichia coliAbnormal    Comment: 50,000-100,000 CFU/mL of Escherichia coli  Resulting Agency [facility]  Susceptibility    Escherichia coli    URINE CULTURE, REFLEX    AMOX/CLAVULANIC >=32  Resistant    AMPICILLIN >=32  Resistant    AMPICILLIN/SULBACTAM 16  Intermediate    CEFAZOLIN 8  Resistant1    CEFEPIME <=1  Sensitive    CEFTRIAXONE <=1   Sensitive    CIPROFLOXACIN <=0.25  Sensitive    ERTAPENEM <=0.5  Sensitive    GENTAMICIN <=1  Sensitive    IMIPENEM <=0.25  Sensitive    LEVOFLOXACIN <=0.12  Sensitive    NITROFURANTOIN <=16  Sensitive    PIP/TAZO 8  Sensitive    TOBRAMYCIN <=1  Sensitive    TRIMETH/SULFA <=20  Sensitive2      Urinary tract infection found.  In addition to above resistances she also has nitrofurantoin allergy.  Cipro can cause low blood sugars with sulfonylurea. We will treat with Bactrim for 7 days.  This is her third urinary tract infection in the last 6 months-we will refer to urology at her request- appears new mychart message  and she cannot see Dr. Jeffie Pollock- we will need to change referral.  [name]

## 2017-07-20 ENCOUNTER — Other Ambulatory Visit: Payer: Self-pay

## 2017-07-20 ENCOUNTER — Encounter: Payer: Self-pay | Admitting: Family Medicine

## 2017-07-20 DIAGNOSIS — N390 Urinary tract infection, site not specified: Secondary | ICD-10-CM

## 2017-07-20 LAB — URINE CULTURE
MICRO NUMBER:: 90640461
SPECIMEN QUALITY:: ADEQUATE

## 2017-07-20 MED ORDER — SULFAMETHOXAZOLE-TRIMETHOPRIM 800-160 MG PO TABS: 1.0000 | ORAL_TABLET | Freq: Two times a day (BID) | ORAL | 0 refills | Status: DC

## 2017-07-20 NOTE — Addendum Note (Signed)
Addended by: Marin Olp on: 07/20/2017 09:56 PM   Modules accepted: Orders

## 2017-07-21 ENCOUNTER — Other Ambulatory Visit: Payer: Self-pay

## 2017-07-21 DIAGNOSIS — N390 Urinary tract infection, site not specified: Secondary | ICD-10-CM

## 2017-07-24 ENCOUNTER — Other Ambulatory Visit: Payer: Self-pay | Admitting: Family Medicine

## 2017-08-02 ENCOUNTER — Encounter: Payer: Self-pay | Admitting: Family Medicine

## 2017-08-03 ENCOUNTER — Other Ambulatory Visit: Payer: Self-pay

## 2017-08-03 MED ORDER — BUPROPION HCL ER (XL) 150 MG PO TB24: 150.0000 mg | ORAL_TABLET | Freq: Every day | ORAL | 1 refills | Status: DC

## 2017-10-24 ENCOUNTER — Other Ambulatory Visit: Payer: Self-pay | Admitting: Family Medicine

## 2017-10-31 ENCOUNTER — Encounter: Payer: Self-pay | Admitting: Family Medicine

## 2017-10-31 NOTE — Progress Notes (Signed)
Called patient's home # and there was no answer.  Unable to leave voicemail msg due to memory being full on answering machine.

## 2017-11-06 LAB — BASIC METABOLIC PANEL
Creatinine: 0.9 (ref 0.5–1.1)
Glucose: 110
Potassium: 4.5 (ref 3.4–5.3)
Sodium: 141 (ref 137–147)

## 2017-11-06 LAB — HEPATIC FUNCTION PANEL
ALT: 11 (ref 7–35)
AST: 17 (ref 13–35)
Alkaline Phosphatase: 60 (ref 25–125)
Bilirubin, Total: 1.7

## 2017-11-06 LAB — CBC AND DIFFERENTIAL
HCT: 42 (ref 36–46)
Hemoglobin: 14.7 (ref 12.0–16.0)
Platelets: 185 (ref 150–399)
WBC: 5.4

## 2017-11-06 LAB — HEMOGLOBIN A1C: Hemoglobin A1C: 5.3

## 2017-11-06 LAB — TSH: TSH: 0.59 (ref 0.41–5.90)

## 2017-11-08 ENCOUNTER — Encounter: Payer: Self-pay | Admitting: Family Medicine

## 2017-11-13 ENCOUNTER — Ambulatory Visit: Payer: No Typology Code available for payment source | Admitting: Family Medicine

## 2017-11-13 ENCOUNTER — Encounter: Payer: Self-pay | Admitting: Family Medicine

## 2017-11-13 VITALS — BP 124/68 | HR 69 | Temp 98.7°F | Ht 66.75 in | Wt 264.2 lb

## 2017-11-13 DIAGNOSIS — R0683 Snoring: Secondary | ICD-10-CM | POA: Diagnosis not present

## 2017-11-13 DIAGNOSIS — E039 Hypothyroidism, unspecified: Secondary | ICD-10-CM | POA: Diagnosis not present

## 2017-11-13 DIAGNOSIS — Z23 Encounter for immunization: Secondary | ICD-10-CM

## 2017-11-13 DIAGNOSIS — G479 Sleep disorder, unspecified: Secondary | ICD-10-CM

## 2017-11-13 DIAGNOSIS — E119 Type 2 diabetes mellitus without complications: Secondary | ICD-10-CM | POA: Diagnosis not present

## 2017-11-13 DIAGNOSIS — E785 Hyperlipidemia, unspecified: Secondary | ICD-10-CM

## 2017-11-13 NOTE — Patient Instructions (Addendum)
Flu shot today  Stop glimepiride due to afternoon lows  We will call you within two weeks about your referral to pulmonary. If you do not hear within 3 weeks, give [place] a call.   Could trial colace stool softener also known as docusate to see if that softens stool and allows passage. You can do this more regularly and then still use occasional senna or dulcolax if you havent had a bowel movement in 4 days.   4-6 months depending on labs. We can write a script for labs once we figure out what was done this time.

## 2017-11-13 NOTE — Progress Notes (Signed)
Subjective:  [name] [name] is a 62 y.o. year old very pleasant female patient who presents for/with See problem oriented charting ROS- poor sleep- feels un refreshed. Slight weight loss. No hypoglycemia reported. No chest pain reported.   Past Medical History-  Patient Active Problem List   Diagnosis Date Noted  . Solitary pulmonary nodule 06/08/2016    Priority: High  . Alcoholic fatty liver 31/51/7616    Priority: High  . Type II diabetes mellitus, well controlled ([place]) 06/23/2010    Priority: High  . Alcohol use disorder, severe, in sustained remission ([place]) 06/23/2010    Priority: High  . Morbid obesity ([place]) 06/23/2010    Priority: High  . Chronic depression 06/23/2010    Priority: High  . Migraine 07/28/2016    Priority: Medium  . Hypothyroid 07/21/2010    Priority: Medium  . HTN (hypertension) 06/23/2010    Priority: Medium  . Hyperlipidemia 06/23/2010    Priority: Medium  . Former smoker 07/10/2014    Priority: Low  . Osteoarthritis 05/16/2014    Priority: Low  . Chronic post-traumatic stress disorder (PTSD) 05/02/2014    Priority: Low  . Sleep disorder 05/02/2014    Priority: Low  . Family history of alcoholism 05/02/2014    Priority: Low  . Macrocytosis 04/19/2014    Priority: Low  . Fatigue 08/15/2013    Priority: Low  . Thyroid nodule 09/17/2012    Priority: Low  . History of colonic polyps 04/12/2010    Priority: Low  . Sleep related headaches 09/07/2016  . Nightmares REM-sleep type 09/07/2016  . RLS (restless legs syndrome) 09/07/2016  . Snoring 09/07/2016  . Excessive daytime sleepiness 09/07/2016  . S/P left TKA 05/05/2015  . S/P knee replacement 05/05/2015  . Shortness of breath 06/07/2014  . Chest pain 06/07/2014    Medications- reviewed and updated Current Outpatient Medications  Medication Sig Dispense Refill  . ACCU-CHEK FASTCLIX LANCETS MISC Use to test blood sugars daily. Dx: E11.9 100 each 11  . aspirin EC 81 MG tablet Take 81 mg by  mouth daily.    Marland Kitchen buPROPion (WELLBUTRIN XL) 150 MG 24 hr tablet Take 1 tablet (150 mg total) by mouth daily. 90 tablet 1  . Cranberry 300 MG tablet Take 300 mg by mouth 2 (two) times daily.    Marland Kitchen escitalopram (LEXAPRO) 20 MG tablet Take 1 tablet (20 mg total) by mouth daily. 90 tablet 2  . glimepiride (AMARYL) 2 MG tablet Take 1 tablet (2 mg total) by mouth daily. 90 tablet 2  . glucose blood (ONETOUCH VERIO) test strip USE TO CHECK BLOOD SUGAR DAILY AND PRN 100 each 12  . levothyroxine (SYNTHROID, LEVOTHROID) 88 MCG tablet Take 1 tablet (88 mcg total) by mouth daily. 90 tablet 3  . PRESCRIPTION MEDICATION Lidocaine 4% Nasal Spray as needed    . simvastatin (ZOCOR) 20 MG tablet Take 1 tablet (20 mg total) by mouth at bedtime. 90 tablet 3  . SitaGLIPtin-MetFORMIN HCl (JANUMET XR) 50-1000 MG TB24 Take 2 tablets by mouth daily. 180 tablet 3  . traZODone (DESYREL) 50 MG tablet TAKE 1 TABLET AT BEDTIME ASNEEDED, MAY REPEAT 1 TIME  IF NEEDED FOR SLEEP 90 tablet 1  . traZODone (DESYREL) 50 MG tablet TAKE 1 TABLET BY MOUTH AT BEDTIME AS NEEDED,TAKE ADDITIONAL 1 IF NEEDED FOR SLEEP 180 tablet 0   Objective: BP 124/68 (BP Location: Left Arm, Patient Position: Sitting, Cuff Size: Large)   Pulse 69   Temp 98.7 F (37.1 C) (Oral)  Ht 5' 6.75" (1.695 m)   Wt 264 lb 3.2 oz (119.8 kg)   SpO2 97%   BMI 41.69 kg/m  Gen: NAD, resting comfortably CV: RRR no murmurs rubs or gallops Lungs: CTAB no crackles, wheeze, rhonchi Abdomen: soft/nontender/nondistended/obese Ext: no edema Skin: warm, dry  Assessment/Plan:  Other notes: 1. constipation- takes an OTC laxative- works for next day or so then has to strain within a few days. Drinks diet sodas no caffeine. Does flavored water some too. Encouraged more water intake- she doesn't seem interested 2. Audiology visit coming up for hearing aids 3. Discussed importance of pushing for weight loss- she is at least down 4 lbs from last year, though up some from  last July (252 to present 264)  Sleep disorder S: poor sleep. taking 2 trazodone per night helps. Snores per husband. Wakes up un refreshed and can fall asleep in daytime if not active.  A/P: will refer to pulmonary or other group that accepts her insurance for sleep study. Ran into issues before with insurance coverage of sleep study- hoping those can be avoided  Type II diabetes mellitus, well controlled ([place]) S: has been controlled on glimepiride 2 mg, janumet 50-1000 mg two pills daily CBGs- 3-4 PM in afternoon can get down to 50s to 70s and does a snack at that time, able to manage this well. May feel slightly more forgetfullness Lab Results  Component Value Date   HGBA1C 5.8 06/21/2017   HGBA1C 5.2 11/18/2016   HGBA1C 5.1 08/22/2016   A/P: will get a1c (actually has been done but havent seen a copy yet- pending), due to hypoglycemia need to stop the glimepiride  Hypothyroid S: On thyroid medication- levothyroxine  88 mcg Lab Results  Component Value Date   TSH 0.92 06/21/2017  A/P: hopeful had TSH with labs- pending these results  Hyperlipidemia S:  controlled on simvastatin 20mg  last check Lab Results  Component Value Date   CHOL 123 06/21/2017   HDL 61 06/21/2017   LDLCALC 48 06/21/2017   LDLDIRECT 158.6 02/26/2014   TRIG 59 06/21/2017   CHOLHDL 3 05/17/2016   A/P: continue current rx  Hypothyroidism- has been controlled- waiting on labs to see current tsh Lab Results  Component Value Date   TSH 0.92 06/21/2017   HLD- waiting on labs to see current state of lipids. She is on simvastatin.   4-6 months depending on labs. We can write a script for labs once we figure out what was done this time.   Lab/Order associations: Snoring - Plan: Ambulatory referral to Pulmonology  Need for prophylactic vaccination and inoculation against influenza - Plan: Flu Vaccine QUAD 36+ mos IM  Sleep disorder  Type II diabetes mellitus, well controlled ([place])  Hypothyroidism,  unspecified type  Hyperlipidemia, unspecified hyperlipidemia type  Return precautions advised.  [name], MD

## 2017-11-15 NOTE — Assessment & Plan Note (Signed)
S: On thyroid medication- levothyroxine  88 mcg Lab Results  Component Value Date   TSH 0.92 06/21/2017  A/P: hopeful had TSH with labs- pending these results

## 2017-11-15 NOTE — Assessment & Plan Note (Signed)
S:  controlled on simvastatin 20mg  last check Lab Results  Component Value Date   CHOL 123 06/21/2017   HDL 61 06/21/2017   LDLCALC 48 06/21/2017   LDLDIRECT 158.6 02/26/2014   TRIG 59 06/21/2017   CHOLHDL 3 05/17/2016   A/P: continue current rx

## 2017-11-15 NOTE — Assessment & Plan Note (Signed)
S: has been controlled on glimepiride 2 mg, janumet 50-1000 mg two pills daily CBGs- 3-4 PM in afternoon can get down to 50s to 70s and does a snack at that time, able to manage this well. May feel slightly more forgetfullness Lab Results  Component Value Date   HGBA1C 5.8 06/21/2017   HGBA1C 5.2 11/18/2016   HGBA1C 5.1 08/22/2016   A/P: will get a1c (actually has been done but havent seen a copy yet- pending), due to hypoglycemia need to stop the glimepiride

## 2017-11-15 NOTE — Assessment & Plan Note (Signed)
S: poor sleep. taking 2 trazodone per night helps. Snores per husband. Wakes up un refreshed and can fall asleep in daytime if not active.  A/P: will refer to pulmonary or other group that accepts her insurance for sleep study. Ran into issues before with insurance coverage of sleep study- hoping those can be avoided

## 2017-11-16 ENCOUNTER — Encounter: Payer: Self-pay | Admitting: Family Medicine

## 2017-11-21 ENCOUNTER — Encounter: Payer: Self-pay | Admitting: Family Medicine

## 2017-11-24 ENCOUNTER — Encounter: Payer: Self-pay | Admitting: Gastroenterology

## 2017-11-25 ENCOUNTER — Encounter: Payer: Self-pay | Admitting: Family Medicine

## 2017-11-28 ENCOUNTER — Encounter: Payer: Self-pay | Admitting: Family Medicine

## 2017-11-29 ENCOUNTER — Other Ambulatory Visit: Payer: Self-pay

## 2017-11-29 ENCOUNTER — Encounter: Payer: Self-pay | Admitting: Pulmonary Disease

## 2017-11-29 ENCOUNTER — Ambulatory Visit: Payer: PRIVATE HEALTH INSURANCE | Admitting: Pulmonary Disease

## 2017-11-29 VITALS — BP 122/76 | HR 67 | Ht 66.5 in | Wt 262.5 lb

## 2017-11-29 DIAGNOSIS — R0602 Shortness of breath: Secondary | ICD-10-CM

## 2017-11-29 DIAGNOSIS — Z1211 Encounter for screening for malignant neoplasm of colon: Secondary | ICD-10-CM

## 2017-11-29 MED ORDER — ALBUTEROL SULFATE HFA 108 (90 BASE) MCG/ACT IN AERS: 2.0000 | INHALATION_SPRAY | Freq: Four times a day (QID) | RESPIRATORY_TRACT | 6 refills | Status: DC | PRN

## 2017-11-29 NOTE — Progress Notes (Signed)
Subjective:    Patient ID: [name] [name], female    DOB: Jul 12, 1955, 62 y.o.   MRN: [id number] CC: Shortness of breath, fatigue, snoring  HPI Progressive shortness of breath for many months, exercise tolerance of a block Reformed smoker Has some joint problems-knee surgery in the past Has a history of a lung nodule History of PTSD History of insomnia-requires trazodone History of snoring Weight gain Very short of breath with any significant activity  45-pack-year smoking history quit in 2011  She has had an exercise study in the past that had to be stopped because she got very short of breath  Has not had a PFT in the past Not diagnosed with any lung disease No previous history of asthma  Goes to bed about 1 AM, wakes up about 1030 Never feels that she is unable nights rest She can basically go back to sleep soon after waking up  Past Medical History:  Diagnosis Date  . Acute meniscal tear of knee    Left knee  . Alcohol problem drinking    rehab  . Anemia    menstrual related  . Anxiety   . Arthritis    right ankle-neuropathy  . Colon polyps   . Depression   . DM (diabetes mellitus), type 2 ([place]) 12/2009   type 2  . Evalluate for OSA (obstructive sleep apnea) 08/15/2013   No OSA on sleep study but may be underestimated.-never used cpap    . GERD (gastroesophageal reflux disease)    not currently taking.  Marland Kitchen Heart murmur   . History of recurrent UTIs   . Hyperlipidemia   . Hypertension   . Hypothyroidism   . Liver disease   . Neuromuscular disorder ([place])    neropathy bilateral feet.  . 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 Shortness of breath    with exertion   . Thyroid disease    Family History  Adopted: Yes  Problem Relation Age of Onset  . COPD Mother   . Alcohol abuse Father    Social History   Socioeconomic History  . Marital status: Married    Spouse name: Not on file  . Number of children: Not on  file  . Years of education: Not on file  . Highest education level: Not on file  Occupational History  . Not on file  Social Needs  . Financial resource strain: Not on file  . Food insecurity:    Worry: Not on file    Inability: Not on file  . Transportation needs:    Medical: Not on file    Non-medical: Not on file  Tobacco Use  . Smoking status: Former Smoker    Packs/day: 1.50    Years: 30.00    Pack years: 45.00    Types: Cigarettes    Last attempt to quit: 02/21/2009    Years since quitting: 8.7  . Smokeless tobacco: Never Used  Substance and Sexual Activity  . Alcohol use: Yes    Alcohol/week: 24.0 standard drinks    Types: 10 Glasses of wine, 14 Cans of beer per week    Comment: quit January 2015 started in august 2015 , quit again 3-26- 2016, past hx ETOH abuse  . Drug use: No    Types: Marijuana    Comment: marijuana in past  . Sexual activity: Yes  Lifestyle  . Physical activity:    Days per week: Not on file  Minutes per session: Not on file  . Stress: Not on file  Relationships  . Social connections:    Talks on phone: Not on file    Gets together: Not on file    Attends religious service: Not on file    Active member of club or organization: Not on file    Attends meetings of clubs or organizations: Not on file    Relationship status: Not on file  . Intimate partner violence:    Fear of current or ex partner: Not on file    Emotionally abused: Not on file    Physically abused: Not on file    Forced sexual activity: Not on file  Other Topics Concern  . Not on file  Social History Narrative   Married. 1 daughter. No grandkids. 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       Hobbies: shopping, travel    Review of Systems  Constitutional: Positive for fatigue.  HENT: Negative.   Eyes: Negative.   Respiratory: Positive for chest tightness, shortness of breath and wheezing.   Cardiovascular: Negative.     Gastrointestinal: Negative.   Endocrine: Negative.   Genitourinary: Negative.   Musculoskeletal: Negative.   Skin: Negative.   Allergic/Immunologic: Negative.   Neurological: Negative.   Hematological: Negative.   Psychiatric/Behavioral: Negative.    Vitals:   11/29/17 1534  BP: 122/76  Pulse: 67  SpO2: 94%      Objective:   Physical Exam  Constitutional: She is oriented to person, place, and time. She appears well-developed and well-nourished.  HENT:  Head: Normocephalic and atraumatic.  Mouth/Throat: No oropharyngeal exudate.  Mallampati 2  Eyes: Pupils are equal, round, and reactive to light. EOM are normal. Right eye exhibits no discharge. Left eye exhibits no discharge.  Neck: Normal range of motion. Neck supple. No tracheal deviation present. No thyromegaly present.  Cardiovascular: Normal rate, regular rhythm and normal heart sounds.  Pulmonary/Chest: Effort normal. No respiratory distress. She has wheezes.  Abdominal: Soft. Bowel sounds are normal. She exhibits no distension. There is no tenderness.  Musculoskeletal: Normal range of motion. She exhibits no edema.  Neurological: She is alert and oriented to person, place, and time. No cranial nerve deficit.  Skin: Skin is warm and dry.  Psychiatric: She has a normal mood and affect.    CT scan from 06/07/2016 did reveal a right lower lobe nodule-repeat is scheduled for tomorrow    Assessment & Plan:  .  Significant shortness of breath -Likely multifactorial may have underlying lung disease, need to rule out significant heart disease, has not had an echocardiogram in the past  .  Wheezing -Likely related to underlying lung disease, COPD-not diagnosed significant smoking history  .  Possible obstructive sleep apnea -History of snoring Nonrestorative sleep Daytime sleepiness with an Epworth Sleepiness Scale of 11 .  History of snoring -There may be a component of obstructive sleep apnea   .  Possible  obstructive lung disease  -Significant smoking history  .  Lung nodule -8 mm nodule for which he is having a follow-up done  Plan. Obtain echocardiogram to assess lung function  Follow-up on CT scan of the chest  Albuterol inhaler for wheezing  Home sleep study to rule out significant sleep disordered breathing  Pulmonary function study to assess for obstructive lung disease  I will see you back in the office in 6 to 8 weeks

## 2017-11-29 NOTE — Patient Instructions (Signed)
Shortness of breath History of snoring Wheezing  Nodule  We will follow-up on the CT scan of the chest Obtain pulmonary function study-assess lung function Obtain echocardiogram-assess heart function  Home sleep study for possible obstructive sleep apnea  I will see you back in the office in about 6 to 8 weeks

## 2017-11-30 ENCOUNTER — Ambulatory Visit (HOSPITAL_COMMUNITY): Payer: PRIVATE HEALTH INSURANCE

## 2017-11-30 ENCOUNTER — Ambulatory Visit (INDEPENDENT_AMBULATORY_CARE_PROVIDER_SITE_OTHER): Payer: No Typology Code available for payment source

## 2017-11-30 DIAGNOSIS — R911 Solitary pulmonary nodule: Secondary | ICD-10-CM

## 2017-11-30 DIAGNOSIS — R0602 Shortness of breath: Secondary | ICD-10-CM

## 2017-11-30 DIAGNOSIS — I700 Atherosclerosis of aorta: Secondary | ICD-10-CM

## 2017-12-08 ENCOUNTER — Other Ambulatory Visit (HOSPITAL_COMMUNITY): Payer: Self-pay

## 2017-12-15 ENCOUNTER — Other Ambulatory Visit: Payer: Self-pay

## 2017-12-15 ENCOUNTER — Ambulatory Visit (HOSPITAL_COMMUNITY): Payer: No Typology Code available for payment source | Attending: Internal Medicine

## 2017-12-15 DIAGNOSIS — R0602 Shortness of breath: Secondary | ICD-10-CM | POA: Diagnosis present

## 2017-12-16 DIAGNOSIS — G4733 Obstructive sleep apnea (adult) (pediatric): Secondary | ICD-10-CM | POA: Diagnosis not present

## 2017-12-19 ENCOUNTER — Other Ambulatory Visit: Payer: Self-pay | Admitting: *Deleted

## 2017-12-19 DIAGNOSIS — G4733 Obstructive sleep apnea (adult) (pediatric): Secondary | ICD-10-CM | POA: Diagnosis not present

## 2017-12-19 DIAGNOSIS — R0602 Shortness of breath: Secondary | ICD-10-CM

## 2017-12-25 ENCOUNTER — Telehealth: Payer: Self-pay

## 2017-12-25 NOTE — Telephone Encounter (Signed)
Attempted to call patient, voicemail has not been set up yet. Will attempt to call back again.    Dr. Ander Slade has reviewed the home sleep test this showed Mild Sleep Apnea and Moderate Oxygen desaturations.   Recommendations   Treatment options are CPAP with the settings auto 5 to 15.    Weight loss measures .   Advise against driving while sleepy & against medication with sedative side effects.    Make appointment for 3 months for compliance with download with Dr. Ander Slade.

## 2017-12-27 ENCOUNTER — Ambulatory Visit (AMBULATORY_SURGERY_CENTER): Payer: Self-pay

## 2017-12-27 ENCOUNTER — Encounter: Payer: Self-pay | Admitting: Gastroenterology

## 2017-12-27 VITALS — Ht 67.0 in | Wt 261.0 lb

## 2017-12-27 DIAGNOSIS — Z86010 Personal history of colonic polyps: Secondary | ICD-10-CM

## 2017-12-27 MED ORDER — PEG-KCL-NACL-NASULF-NA ASC-C 140 G PO SOLR: 1.0000 | Freq: Once | ORAL | Status: AC

## 2017-12-27 NOTE — Progress Notes (Signed)
Per pt, no allergies to soy or egg products.Pt not taking any weight loss meds or using  O2 at home.  Pt refused emmi video. 

## 2017-12-28 NOTE — Telephone Encounter (Signed)
Spoke with patients husband who is on the dpr he will make her aware of these recs we will  see her in office tomorrow.

## 2017-12-29 ENCOUNTER — Ambulatory Visit: Payer: No Typology Code available for payment source | Admitting: Pulmonary Disease

## 2017-12-29 ENCOUNTER — Encounter: Payer: Self-pay | Admitting: Pulmonary Disease

## 2017-12-29 VITALS — BP 128/72 | HR 77 | Ht 66.5 in | Wt 262.0 lb

## 2017-12-29 DIAGNOSIS — R0602 Shortness of breath: Secondary | ICD-10-CM

## 2017-12-29 DIAGNOSIS — G4733 Obstructive sleep apnea (adult) (pediatric): Secondary | ICD-10-CM

## 2017-12-29 DIAGNOSIS — R918 Other nonspecific abnormal finding of lung field: Secondary | ICD-10-CM | POA: Diagnosis not present

## 2017-12-29 NOTE — Patient Instructions (Signed)
Mild obstructive sleep apnea We will contact DME company and start you on a CPAP-auto titrating CPAP 5-15  We will see you back in the office in about 2 to 3 months We will follow-up with compliance data  We will repeat your CT scan of the chest in 2 years to follow-up on the lung nodule which was stable on the recent CT  Call with any significant concerns

## 2017-12-29 NOTE — Progress Notes (Signed)
Subjective:    Patient ID: [name] [name], female    DOB: 21-Oct-1955, 62 y.o.   MRN: [id number] CC: Shortness of breath, fatigue, snoring  HPI  Symptoms are unchanged from previous Shortness of breath, wheezing Sleep study positive for mild obstructive sleep apnea with mild desaturation Echocardiogram was within normal limits   Progressive shortness of breath for many months, exercise tolerance of a block  Reformed smoker Has some joint problems-knee surgery in the past Has a history of a lung nodule History of PTSD History of insomnia-requires trazodone History of snoring Weight gain Very short of breath with any significant activity  45-pack-year smoking history quit in 2011  She has had an exercise study in the past that had to be stopped because she got very short of breath  Goes to bed about 1 AM, wakes up about 1030 Never feels that she is unable nights rest She can basically go back to sleep soon after waking up  Past Medical History:  Diagnosis Date  . Acute meniscal tear of knee    Left knee  . Alcohol problem drinking    rehab  . Anemia    menstrual related  . Anxiety   . Arthritis    right ankle-neuropathy  . Colon polyps   . Depression   . DM (diabetes mellitus), type 2 ([place]) 12/2009   type 2  . Evalluate for OSA (obstructive sleep apnea) 08/15/2013   No OSA on sleep study but may be underestimated.-never used cpap    . GERD (gastroesophageal reflux disease)    not currently taking.  Marland Kitchen Heart murmur   . History of recurrent UTIs   . Hyperlipidemia   . Hypertension   . Hypothyroidism   . Liver disease   . Neuromuscular disorder ([place])    neropathy bilateral feet.  . 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 Shortness of breath    with exertion   . Thyroid disease    Family History  Adopted: Yes  Problem Relation Age of Onset  . COPD Mother   . Alcohol abuse Father    Social History   Socioeconomic  History  . Marital status: Married    Spouse name: Not on file  . Number of children: Not on file  . Years of education: Not on file  . Highest education level: Not on file  Occupational History  . Not on file  Social Needs  . Financial resource strain: Not on file  . Food insecurity:    Worry: Not on file    Inability: Not on file  . Transportation needs:    Medical: Not on file    Non-medical: Not on file  Tobacco Use  . Smoking status: Former Smoker    Packs/day: 1.50    Years: 30.00    Pack years: 45.00    Types: Cigarettes    Last attempt to quit: 02/21/2009    Years since quitting: 8.8  . Smokeless tobacco: Never Used  Substance and Sexual Activity  . Alcohol use: Not Currently  . Drug use: No    Types: Marijuana    Comment: marijuana in past  . Sexual activity: Yes  Lifestyle  . Physical activity:    Days per week: Not on file    Minutes per session: Not on file  . Stress: Not on file  Relationships  . Social connections:    Talks on phone: Not on file  Gets together: Not on file    Attends religious service: Not on file    Active member of club or organization: Not on file    Attends meetings of clubs or organizations: Not on file    Relationship status: Not on file  . Intimate partner violence:    Fear of current or ex partner: Not on file    Emotionally abused: Not on file    Physically abused: Not on file    Forced sexual activity: Not on file  Other Topics Concern  . Not on file  Social History Narrative   Married. 1 daughter. No grandkids. 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       Hobbies: shopping, travel    Review of Systems  Constitutional: Positive for fatigue.  HENT: Negative.   Eyes: Negative.   Respiratory: Positive for chest tightness, shortness of breath and wheezing.   Cardiovascular: Negative.   Gastrointestinal: Negative.    Vitals:   12/29/17 1537  BP: 128/72  Pulse: 77  SpO2: 98%        Objective:   Physical Exam  Constitutional: She appears well-developed and well-nourished.  HENT:  Head: Normocephalic and atraumatic.  Mouth/Throat: No oropharyngeal exudate.  Mallampati 2  Eyes: Pupils are equal, round, and reactive to light. EOM are normal. Right eye exhibits no discharge. Left eye exhibits no discharge.  Neck: Normal range of motion. Neck supple. No tracheal deviation present. No thyromegaly present.  Cardiovascular: Normal rate, regular rhythm and normal heart sounds.  Pulmonary/Chest: Effort normal. No respiratory distress. She has no wheezes.  Abdominal: Soft. Bowel sounds are normal. She exhibits no distension. There is no tenderness.   CT scan of the chest reviewed by myself showing stable lung nodule Sleep study results, discussed with the patient Echocardiogram results discussed with the patient    Assessment & Plan:  .  Significant shortness of breath -PFTs pending Encouraged to continue using inhaler as needed  .  Wheezing -PFT pending at present  .  Mild obstructive sleep apnea -Home sleep study reveals mild obstructive sleep apnea with mild oxygen desaturations  .  Possible obstructive lung disease  -Significant smoking history -Obtain PFT  .  Lung nodule -8 mm nodule , stable on repeat  Plan.  Albuterol inhaler for wheezing  Pulmonary function study to assess for obstructive lung disease  We will initiate CPAP DME referral for auto titrating CPAP  I will see patient back in the office in about 2 to 3 months  Encouraged to call if any significant concerns

## 2018-01-02 ENCOUNTER — Ambulatory Visit (INDEPENDENT_AMBULATORY_CARE_PROVIDER_SITE_OTHER): Payer: No Typology Code available for payment source | Admitting: Pulmonary Disease

## 2018-01-02 DIAGNOSIS — R0602 Shortness of breath: Secondary | ICD-10-CM

## 2018-01-02 LAB — PULMONARY FUNCTION TEST
DL/VA % pred: 88 %
DL/VA: 4.41 ml/min/mmHg/L
DLCO cor % pred: 80 %
DLCO cor: 21.25 ml/min/mmHg
DLCO unc % pred: 83 %
DLCO unc: 22.05 ml/min/mmHg
FEF 25-75 Post: 2.17 L/sec
FEF 25-75 Pre: 2.09 L/sec
FEF2575-%Change-Post: 3 %
FEF2575-%Pred-Post: 94 %
FEF2575-%Pred-Pre: 90 %
FEV1-%Change-Post: 0 %
FEV1-%Pred-Post: 88 %
FEV1-%Pred-Pre: 88 %
FEV1-Post: 2.32 L
FEV1-Pre: 2.31 L
FEV1FVC-%Change-Post: 0 %
FEV1FVC-%Pred-Pre: 100 %
FEV6-%Change-Post: 1 %
FEV6-%Pred-Post: 91 %
FEV6-%Pred-Pre: 89 %
FEV6-Post: 2.99 L
FEV6-Pre: 2.93 L
FEV6FVC-%Change-Post: 0 %
FEV6FVC-%Pred-Post: 103 %
FEV6FVC-%Pred-Pre: 103 %
FVC-%Change-Post: 1 %
FVC-%Pred-Post: 87 %
FVC-%Pred-Pre: 86 %
FVC-Post: 2.99 L
FVC-Pre: 2.96 L
Post FEV1/FVC ratio: 77 %
Post FEV6/FVC ratio: 100 %
Pre FEV1/FVC ratio: 78 %
Pre FEV6/FVC Ratio: 100 %
RV % pred: 112 %
RV: 2.38 L
TLC % pred: 109 %
TLC: 5.77 L

## 2018-01-02 NOTE — Progress Notes (Signed)
PFT completed today.  

## 2018-01-09 ENCOUNTER — Telehealth: Payer: Self-pay | Admitting: Pulmonary Disease

## 2018-01-09 NOTE — Telephone Encounter (Signed)
Called patient unable to reach. Left message to give [place] a call back.   AO please advise on patients PFT. Thank you.

## 2018-01-10 ENCOUNTER — Encounter: Payer: Self-pay | Admitting: Gastroenterology

## 2018-01-10 ENCOUNTER — Ambulatory Visit (AMBULATORY_SURGERY_CENTER): Payer: No Typology Code available for payment source | Admitting: Gastroenterology

## 2018-01-10 VITALS — BP 134/61 | HR 71 | Temp 97.5°F | Resp 13 | Ht 67.0 in | Wt 261.0 lb

## 2018-01-10 DIAGNOSIS — D128 Benign neoplasm of rectum: Secondary | ICD-10-CM

## 2018-01-10 DIAGNOSIS — K635 Polyp of colon: Secondary | ICD-10-CM | POA: Diagnosis not present

## 2018-01-10 DIAGNOSIS — D122 Benign neoplasm of ascending colon: Secondary | ICD-10-CM

## 2018-01-10 DIAGNOSIS — Z86010 Personal history of colonic polyps: Secondary | ICD-10-CM | POA: Diagnosis not present

## 2018-01-10 DIAGNOSIS — Z860101 Personal history of adenomatous and serrated colon polyps: Secondary | ICD-10-CM

## 2018-01-10 MED ORDER — SODIUM CHLORIDE 0.9 % IV SOLN: 500.0000 mL | Freq: Once | INTRAVENOUS | Status: DC

## 2018-01-10 NOTE — Telephone Encounter (Signed)
Attempted to call pt but unable to reach her. Left message for pt to return call. 

## 2018-01-10 NOTE — Telephone Encounter (Signed)
Patient states if cannot reach her at [phone], try [phone]

## 2018-01-10 NOTE — Telephone Encounter (Signed)
Call patient with PFT results    pulmonary function test is normal

## 2018-01-10 NOTE — Telephone Encounter (Signed)
Pt is calling back [phone]

## 2018-01-10 NOTE — Progress Notes (Signed)
Pt's states no medical or surgical changes since previsit or office visit. 

## 2018-01-10 NOTE — Telephone Encounter (Signed)
Patient requesting results of PFT.   OA please advise of PFT results.

## 2018-01-10 NOTE — Progress Notes (Signed)
Called to room to assist during endoscopic procedure.  Patient ID and intended procedure confirmed with present staff. Received instructions for my participation in the procedure from the performing physician.  

## 2018-01-10 NOTE — Op Note (Addendum)
[facility] Patient Name: [name] [name] Procedure Date: 01/10/2018 11:07 AM MRN: [id number] Endoscopist: [place]. Loletha Carrow , MD Age: 62 Referring MD:  Date of Birth: 01/31/1956 Gender: Female Account #: [id number] Procedure:                Colonoscopy Indications:              Surveillance: Personal history of adenomatous                            polyps on last colonoscopy 5 years ago (TA x 4, all                            < 53mm; 11/2012) Medicines:                Monitored Anesthesia Care Procedure:                Pre-Anesthesia Assessment:                           - Prior to the procedure, a History and Physical                            was performed, and patient medications and                            allergies were reviewed. The patient's tolerance of                            previous anesthesia was also reviewed. The risks                            and benefits of the procedure and the sedation                            options and risks were discussed with the patient.                            All questions were answered, and informed consent                            was obtained. Anticoagulants: The patient has taken                            aspirin. It was decided not to withhold this                            medication prior to the procedure. ASA Grade                            Assessment: III - A patient with severe systemic                            disease. After reviewing the risks and benefits,  the patient was deemed in satisfactory condition to                            undergo the procedure.                           After obtaining informed consent, the colonoscope                            was passed under direct vision. Throughout the                            procedure, the patient's blood pressure, pulse, and                            oxygen saturations were monitored continuously. The               Colonoscope was introduced through the anus and                            advanced to the the cecum, identified by                            appendiceal orifice and ileocecal valve. The                            colonoscopy was performed without difficulty. The                            patient tolerated the procedure well. The quality                            of the bowel preparation was good. The ileocecal                            valve, appendiceal orifice, and rectum were                            photographed. Scope In: 11:21:34 AM Scope Out: 11:41:13 AM Scope Withdrawal Time: 0 hours 13 minutes 53 seconds  Total Procedure Duration: 0 hours 19 minutes 39 seconds  Findings:                 The perianal and digital rectal examinations were                            normal.                           A 2 mm polyp was found in the ascending colon. The                            polyp was sessile. The polyp was removed with a  cold biopsy forceps. Resection and retrieval were                            complete.                           A 4 mm polyp was found in the rectum. The polyp was                            sessile. The polyp was removed with a piecemeal                            technique using a cold biopsy forceps. Resection                            and retrieval were complete.                           The exam was otherwise without abnormality on                            direct and retroflexion views. Complications:            No immediate complications. Estimated Blood Loss:     Estimated blood loss was minimal. Impression:               - One 2 mm polyp in the ascending colon, removed                            with a cold biopsy forceps. Resected and retrieved.                           - One 4 mm polyp in the rectum, removed piecemeal                            using a cold biopsy forceps. Resected and retrieved.                            - The examination was otherwise normal on direct                            and retroflexion views. Recommendation:           - Patient has a contact number available for                            emergencies. The signs and symptoms of potential                            delayed complications were discussed with the                            patient. Return to normal activities tomorrow.  Written discharge instructions were provided to the                            patient.                           - Resume previous diet.                           - Continue present medications.                           - Await pathology results.                           - Repeat colonoscopy is recommended for                            surveillance. The colonoscopy date will be                            determined after pathology results from today's                            exam become available for review. Jasiel Belisle L. Loletha Carrow, MD 01/10/2018 11:46:23 AM This report has been signed electronically.

## 2018-01-10 NOTE — Patient Instructions (Signed)
Handouts: Polyps  YOU HAD AN ENDOSCOPIC PROCEDURE TODAY AT THE [facility]:   Refer to the procedure report that was given to you for any specific questions about what was found during the examination.  If the procedure report does not answer your questions, please call your gastroenterologist to clarify.  If you requested that your care partner not be given the details of your procedure findings, then the procedure report has been included in a sealed envelope for you to review at your convenience later.  YOU SHOULD EXPECT: Some feelings of bloating in the abdomen. Passage of more gas than usual.  Walking can help get rid of the air that was put into your GI tract during the procedure and reduce the bloating. If you had a lower endoscopy (such as a colonoscopy or flexible sigmoidoscopy) you may notice spotting of blood in your stool or on the toilet paper. If you underwent a bowel prep for your procedure, you may not have a normal bowel movement for a few days.  Please Note:  You might notice some irritation and congestion in your nose or some drainage.  This is from the oxygen used during your procedure.  There is no need for concern and it should clear up in a day or so.  SYMPTOMS TO REPORT IMMEDIATELY:   Following lower endoscopy (colonoscopy or flexible sigmoidoscopy):  Excessive amounts of blood in the stool  Significant tenderness or worsening of abdominal pains  Swelling of the abdomen that is new, acute  Fever of 100F or higher  For urgent or emergent issues, a gastroenterologist can be reached at any hour by calling [phone].   DIET:  We do recommend a small meal at first, but then you may proceed to your regular diet.  Drink plenty of fluids but you should avoid alcoholic beverages for 24 hours.  ACTIVITY:  You should plan to take it easy for the rest of today and you should NOT DRIVE or use heavy machinery until tomorrow (because of the sedation medicines used  during the test).    FOLLOW UP: Our staff will call the number listed on your records the next business day following your procedure to check on you and address any questions or concerns that you may have regarding the information given to you following your procedure. If we do not reach you, we will leave a message.  However, if you are feeling well and you are not experiencing any problems, there is no need to return our call.  We will assume that you have returned to your regular daily activities without incident.  If any biopsies were taken you will be contacted by phone or by letter within the next 1-3 weeks.  Please call us at [phone] if you have not heard about the biopsies in 3 weeks.    SIGNATURES/CONFIDENTIALITY: You and/or your care partner have signed paperwork which will be entered into your electronic medical record.  These signatures attest to the fact that that the information above on your After Visit Summary has been reviewed and is understood.  Full responsibility of the confidentiality of this discharge information lies with you and/or your care-partner. 

## 2018-01-10 NOTE — Progress Notes (Signed)
PT taken to PACU. Monitors in place. VSS. Report given to RN. 

## 2018-01-11 ENCOUNTER — Ambulatory Visit: Payer: Self-pay | Admitting: Pulmonary Disease

## 2018-01-11 ENCOUNTER — Telehealth: Payer: Self-pay

## 2018-01-11 ENCOUNTER — Telehealth: Payer: Self-pay | Admitting: *Deleted

## 2018-01-11 NOTE — Telephone Encounter (Signed)
Called and spoke with pt letting her know the results of the PFT. Pt expressed understanidng. While speaking with pt, she stated to me she was wanting to know the status of her getting a CPAP.  Looked at pt's last OV where AO stated pt did have mild OSA and she was to start on CPAP. An order was placed at that [place] and I saw in the referrals tab that pt's information for the CPAP was sent to [facility].  I stated to pt I would try to call [facility] to see if I could check on the status.  Attempted to call [facility] but was unable to speak to someone. I did leave a message for someone from [place] but was unable to reach them. Left message for [facility] to return call.  I did call pt back but was unable to speak to her. Left a detailed message for pt stating that we were awaiting [facility] to call us back.  Will leave this encounter open until all has been able to be addressed for pt.

## 2018-01-11 NOTE — Telephone Encounter (Signed)
second post procedure follow up call, no answer

## 2018-01-11 NOTE — Telephone Encounter (Signed)
  Follow up Call-  Call back number 01/10/2018  Post procedure Call Back phone  # [phone]  Permission to leave phone message Yes  Some recent data might be hidden     Patient questions:  Message left to call us if necessary.

## 2018-01-15 NOTE — Telephone Encounter (Signed)
[facility] returned phone call

## 2018-01-15 NOTE — Telephone Encounter (Signed)
Called pt to see if she had heard anything from [facility] in regards to starting on CPAP but pt states she has not heard anything and has not been able to start on CPAP yet. I stated to pt I would call [facility] to check status. Pt expressed understanding.  Called [facility] but unable to reach a representative at [facility]. Left message for [facility] to return call.

## 2018-01-16 LAB — HM DIABETES EYE EXAM

## 2018-01-16 NOTE — Telephone Encounter (Signed)
Called [facility] and spoke with Abigail Butts to see if she could check the status of when pt could be started on her CPAP.  Per Abigail Butts, she is not able to see pt in her database. I looked at the referral tab and stated to Abigail Butts that the order was sent to [facility]/[facility] at [facility]. Abigail Butts stated that is the [place] location. She put me on hold to call the [place] location to see if they could locate the information in regards to pt beginning on CPAP. I stated to Abigail Butts that this was placed/sent 12/29/17.  When Abigail Butts returned back the phone, she stated to me she called the [place] location and spoke with their Probation officer. Altha Harm is going to look to see if she can find the order that was placed by [place] and sent to them 12/29/17. If we need anything, the main number to the [place] location is [phone]. Abigail Butts stated that either Altha Harm or someone else from the [place] location would return call to give [place] a status update.

## 2018-01-17 ENCOUNTER — Encounter: Payer: Self-pay | Admitting: Gastroenterology

## 2018-01-23 NOTE — Telephone Encounter (Signed)
Left message for Darnelle Bos [facility] to see where things stand for CPAP for patient.

## 2018-01-25 NOTE — Telephone Encounter (Signed)
Attempted to call pt to see if things have been able to be worked out with her receiving her CPAP machine but unable to reach her. Left message for pt to return call.

## 2018-01-28 ENCOUNTER — Other Ambulatory Visit: Payer: Self-pay | Admitting: Family Medicine

## 2018-01-29 DIAGNOSIS — M19071 Primary osteoarthritis, right ankle and foot: Secondary | ICD-10-CM | POA: Insufficient documentation

## 2018-01-29 NOTE — Telephone Encounter (Signed)
ATC patient, unable to reach [facility] x2 on preferred phone number listed for patient.   ATC [facility] and they were closed until 1pm will atc again later

## 2018-01-30 ENCOUNTER — Telehealth: Payer: Self-pay | Admitting: Pulmonary Disease

## 2018-01-30 NOTE — Telephone Encounter (Signed)
Called and spoke with patient, she is aware of message below and verbalized understanding. Nothing further needed.    ATC pt, no answer. Left message for pt to call back.  I called [facility] and pt has an appointment at 3pm today to setup CPAP. Nothing further is needed.

## 2018-01-30 NOTE — Telephone Encounter (Addendum)
ATC pt, no answer. Left message for pt to call back.  I called [facility] and pt has an appointment at 3pm today to setup CPAP. Nothing further is needed.

## 2018-02-05 ENCOUNTER — Encounter: Payer: Self-pay | Admitting: Family Medicine

## 2018-02-26 ENCOUNTER — Encounter: Payer: Self-pay | Admitting: Family Medicine

## 2018-02-28 ENCOUNTER — Other Ambulatory Visit: Payer: Self-pay

## 2018-02-28 MED ORDER — BUPROPION HCL ER (XL) 300 MG PO TB24: 300.0000 mg | ORAL_TABLET | Freq: Every day | ORAL | 2 refills | Status: DC

## 2018-03-05 ENCOUNTER — Encounter: Payer: Self-pay | Admitting: Pulmonary Disease

## 2018-03-05 ENCOUNTER — Ambulatory Visit: Payer: No Typology Code available for payment source | Admitting: Pulmonary Disease

## 2018-03-05 VITALS — BP 140/70 | HR 67 | Ht 66.0 in | Wt 256.0 lb

## 2018-03-05 DIAGNOSIS — G4733 Obstructive sleep apnea (adult) (pediatric): Secondary | ICD-10-CM

## 2018-03-05 DIAGNOSIS — Z9989 Dependence on other enabling machines and devices: Secondary | ICD-10-CM

## 2018-03-05 NOTE — Patient Instructions (Signed)
Obstructive sleep apnea appears well treated at the present  Continue with CPAP use  I will see you back in 6 months Call with any significant concerns

## 2018-03-05 NOTE — Progress Notes (Signed)
Subjective:    Patient ID: [name] [name], female    DOB: 08/02/1955, 63 y.o.   MRN: [id number] CC: Shortness of breath, fatigue, snoring  HPI   Shortness of breath, wheezing Sleep study positive for mild obstructive sleep apnea with mild desaturation Echocardiogram was within normal limits Diagnosis obstructive sleep apnea-compliant with CPAP use  Exercise tolerance is better, feeling better generally  Reformed smoker Has some joint problems-knee surgery in the past Has a history of a lung nodule History of PTSD History of insomnia-requires trazodone History of snoring Weight gain Very short of breath with any significant activity  45-pack-year smoking history quit in 2011  She has had an exercise study in the past that had to be stopped because she got very short of breath  Goes to bed about 1 AM, wakes up about 1030 Never feels that she is unable nights rest She can basically go back to sleep soon after waking up  Past Medical History:  Diagnosis Date  . Acute meniscal tear of knee    Left knee  . Alcohol problem drinking    rehab  . Anemia    menstrual related  . Anxiety   . Arthritis    right ankle-neuropathy  . Colon polyps   . Depression   . DM (diabetes mellitus), type 2 ([place]) 12/2009   type 2  . [place] for OSA (obstructive sleep apnea) 08/15/2013   No OSA on sleep study but may be underestimated.-never used cpap    . GERD (gastroesophageal reflux disease)    not currently taking.  Marland Kitchen Heart murmur   . History of recurrent UTIs   . Hyperlipidemia   . Hypertension   . Hypothyroidism   . Liver disease   . Neuromuscular disorder ([place])    neropathy bilateral feet.  . 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 Shortness of breath    with exertion   . Thyroid disease    Family History  Adopted: Yes  Problem Relation Age of Onset  . COPD Mother   . Alcohol abuse Father    Social History   Socioeconomic  History  . Marital status: Married    Spouse name: Not on file  . Number of children: Not on file  . Years of education: Not on file  . Highest education level: Not on file  Occupational History  . Not on file  Social Needs  . Financial resource strain: Not on file  . Food insecurity:    Worry: Not on file    Inability: Not on file  . Transportation needs:    Medical: Not on file    Non-medical: Not on file  Tobacco Use  . Smoking status: Former Smoker    Packs/day: 1.50    Years: 30.00    Pack years: 45.00    Types: Cigarettes    Last attempt to quit: 02/21/2009    Years since quitting: 9.0  . Smokeless tobacco: Never Used  Substance and Sexual Activity  . Alcohol use: Not Currently  . Drug use: No    Types: Marijuana    Comment: marijuana in past  . Sexual activity: Yes  Lifestyle  . Physical activity:    Days per week: Not on file    Minutes per session: Not on file  . Stress: Not on file  Relationships  . Social connections:    Talks on phone: Not on file    Gets together:  Not on file    Attends religious service: Not on file    Active member of club or organization: Not on file    Attends meetings of clubs or organizations: Not on file    Relationship status: Not on file  . Intimate partner violence:    Fear of current or ex partner: Not on file    Emotionally abused: Not on file    Physically abused: Not on file    Forced sexual activity: Not on file  Other Topics Concern  . Not on file  Social History Narrative   Married. 1 daughter. No grandkids. 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       Hobbies: shopping, travel    Review of Systems  Constitutional: Positive for fatigue.  HENT: Negative.   Eyes: Negative.   Respiratory: Positive for chest tightness. Negative for shortness of breath and wheezing.   Cardiovascular: Negative.   Gastrointestinal: Negative.    Vitals:   03/05/18 1420  BP: 140/70  Pulse: 67    SpO2: 98%      Objective:   Physical Exam Constitutional:      Appearance: She is well-developed.  HENT:     Head: Normocephalic and atraumatic.     Mouth/Throat:     Mouth: Mucous membranes are moist.     Pharynx: No oropharyngeal exudate.  Eyes:     General:        Right eye: No discharge.        Left eye: No discharge.     Pupils: Pupils are equal, round, and reactive to light.  Neck:     Musculoskeletal: Normal range of motion and neck supple.     Thyroid: No thyromegaly.     Trachea: No tracheal deviation.  Cardiovascular:     Rate and Rhythm: Normal rate and regular rhythm.     Heart sounds: Normal heart sounds.  Pulmonary:     Effort: Pulmonary effort is normal. No respiratory distress.     Breath sounds: No stridor. No wheezing or rhonchi.  Abdominal:     General: Bowel sounds are normal. There is no distension.     Palpations: Abdomen is soft.     Tenderness: There is no abdominal tenderness. There is no guarding.    CT scan of the chest reviewed by myself showing stable lung nodule Sleep study results, discussed with the patient Echocardiogram results discussed with the patient Results of the Epworth flowsheet 11/29/2017  Sitting and reading 3  Watching TV 2  Sitting, inactive in a public place (e.g. a theatre or a meeting) 0  As a passenger in a car for an hour without a break 3  Lying down to rest in the afternoon when circumstances permit 3  Sitting and talking to someone 0  Sitting quietly after a lunch without alcohol 0  In a car, while stopped for a few minutes in traffic 0  Total score 11       Assessment & Plan:  .  Significant shortness of breath -PFTs pending Encouraged to continue using inhaler as needed -Symptoms are much better present  .  Wheezing -Improved  .  Mild obstructive sleep apnea -Home sleep study reveals mild obstructive sleep apnea with mild oxygen desaturations -Currently on CPAP therapy and tolerating it well  .   Possible obstructive lung disease  -Significant smoking history -Obtain PFT  .  Lung nodule -8 mm nodule , stable  on repeat  Plan.  Albuterol inhaler for wheezing-to be used as needed   Continue CPAP use  I will see patient back in the office in about 6 months  Encouraged to call if any significant concerns

## 2018-04-02 ENCOUNTER — Ambulatory Visit: Payer: Self-pay | Admitting: Pulmonary Disease

## 2018-04-24 ENCOUNTER — Other Ambulatory Visit: Payer: Self-pay | Admitting: Family Medicine

## 2018-05-17 ENCOUNTER — Other Ambulatory Visit: Payer: Self-pay

## 2018-05-18 ENCOUNTER — Encounter: Payer: Self-pay | Admitting: Family Medicine

## 2018-05-19 ENCOUNTER — Other Ambulatory Visit: Payer: Self-pay | Admitting: Family Medicine

## 2018-05-19 ENCOUNTER — Other Ambulatory Visit: Payer: Self-pay

## 2018-05-19 DIAGNOSIS — E039 Hypothyroidism, unspecified: Secondary | ICD-10-CM

## 2018-05-19 MED ORDER — LEVOTHYROXINE SODIUM 88 MCG PO TABS: 88.0000 ug | ORAL_TABLET | Freq: Every day | ORAL | 0 refills | Status: DC

## 2018-05-19 MED ORDER — GLIMEPIRIDE 2 MG PO TABS: 2.0000 mg | ORAL_TABLET | Freq: Every day | ORAL | 0 refills | Status: DC

## 2018-05-19 MED ORDER — SIMVASTATIN 20 MG PO TABS: ORAL_TABLET | ORAL | 0 refills | Status: DC

## 2018-06-19 ENCOUNTER — Encounter: Payer: Self-pay | Admitting: Family Medicine

## 2018-06-19 ENCOUNTER — Ambulatory Visit (INDEPENDENT_AMBULATORY_CARE_PROVIDER_SITE_OTHER): Payer: No Typology Code available for payment source | Admitting: Family Medicine

## 2018-06-19 VITALS — BP 148/66 | HR 69 | Temp 96.5°F | Ht 66.0 in | Wt 252.0 lb

## 2018-06-19 DIAGNOSIS — N3000 Acute cystitis without hematuria: Secondary | ICD-10-CM | POA: Diagnosis not present

## 2018-06-19 DIAGNOSIS — E039 Hypothyroidism, unspecified: Secondary | ICD-10-CM | POA: Diagnosis not present

## 2018-06-19 DIAGNOSIS — E1159 Type 2 diabetes mellitus with other circulatory complications: Secondary | ICD-10-CM

## 2018-06-19 DIAGNOSIS — E119 Type 2 diabetes mellitus without complications: Secondary | ICD-10-CM

## 2018-06-19 DIAGNOSIS — I10 Essential (primary) hypertension: Secondary | ICD-10-CM

## 2018-06-19 MED ORDER — BENAZEPRIL HCL 20 MG PO TABS: 20.0000 mg | ORAL_TABLET | Freq: Every day | ORAL | 1 refills | Status: DC

## 2018-06-19 MED ORDER — TRAZODONE HCL 50 MG PO TABS: ORAL_TABLET | ORAL | 1 refills | Status: DC

## 2018-06-19 MED ORDER — GLIMEPIRIDE 2 MG PO TABS: 2.0000 mg | ORAL_TABLET | Freq: Every day | ORAL | 1 refills | Status: DC

## 2018-06-19 MED ORDER — LEVOTHYROXINE SODIUM 88 MCG PO TABS: 88.0000 ug | ORAL_TABLET | Freq: Every day | ORAL | 1 refills | Status: DC

## 2018-06-19 MED ORDER — SULFAMETHOXAZOLE-TRIMETHOPRIM 800-160 MG PO TABS: 1.0000 | ORAL_TABLET | Freq: Two times a day (BID) | ORAL | 0 refills | Status: DC

## 2018-06-19 MED ORDER — SIMVASTATIN 20 MG PO TABS: ORAL_TABLET | ORAL | 1 refills | Status: DC

## 2018-06-19 MED ORDER — SITAGLIP PHOS-METFORMIN HCL ER 50-1000 MG PO TB24: 2.0000 | ORAL_TABLET | Freq: Every day | ORAL | 1 refills | Status: DC

## 2018-06-19 NOTE — Progress Notes (Signed)
Phone [phone]   Subjective:  Virtual visit via Video note. Chief complaint: Chief Complaint  Patient presents with  . Urinary Tract Infection    x 2weeks  . Medication Refill    This visit type was conducted due to national recommendations for restrictions regarding the COVID-19 Pandemic (e.g. social distancing).  This format is felt to be most appropriate for this patient at this time balancing risks to patient and risks to population by having him in for in person visit.  No physical exam was performed (except for noted visual exam or audio findings with Telehealth visits).    Our team/I connected with [name] [name] on 06/19/18 at  4:00 PM EDT by a video enabled telemedicine application ([email or web address]) and verified that I am speaking with the correct person using two identifiers.  Location patient: [place] Location provider: [facility], office Persons participating in the virtual visit:  patient  Our team/I discussed the limitations of evaluation and management by telemedicine and the availability of in person appointments. In light of current covid-19 pandemic, patient also understands that we are trying to protect them by minimizing in office contact if at all possible.  The patient expressed consent for telemedicine visit and agreed to proceed. Patient understands insurance will be billed.   ROS- see ros below   Past Medical History-  Patient Active Problem List   Diagnosis Date Noted  . Solitary pulmonary nodule 06/08/2016    Priority: High  . Alcoholic fatty liver 62/83/1517    Priority: High  . Type II diabetes mellitus, well controlled ([place]) 06/23/2010    Priority: High  . Alcohol use disorder, severe, in sustained remission ([place]) 06/23/2010    Priority: High  . Morbid obesity ([place]) 06/23/2010    Priority: High  . Chronic depression 06/23/2010    Priority: High  . Migraine 07/28/2016    Priority: Medium  . Hypothyroid 07/21/2010    Priority: Medium  . Hypertension  associated with diabetes ([place]) 06/23/2010    Priority: Medium  . Hyperlipidemia associated with type 2 diabetes mellitus ([place]) 06/23/2010    Priority: Medium  . Former smoker 07/10/2014    Priority: Low  . Osteoarthritis 05/16/2014    Priority: Low  . Chronic post-traumatic stress disorder (PTSD) 05/02/2014    Priority: Low  . Sleep disorder 05/02/2014    Priority: Low  . Family history of alcoholism 05/02/2014    Priority: Low  . Macrocytosis 04/19/2014    Priority: Low  . Fatigue 08/15/2013    Priority: Low  . Thyroid nodule 09/17/2012    Priority: Low  . History of colonic polyps 04/12/2010    Priority: Low  . Sleep related headaches 09/07/2016  . Nightmares REM-sleep type 09/07/2016  . RLS (restless legs syndrome) 09/07/2016  . Snoring 09/07/2016  . Excessive daytime sleepiness 09/07/2016  . S/P left TKA 05/05/2015  . S/P knee replacement 05/05/2015  . Shortness of breath 06/07/2014  . Chest pain 06/07/2014    Medications- reviewed and updated Current Outpatient Medications  Medication Sig Dispense Refill  . albuterol (PROVENTIL HFA;VENTOLIN HFA) 108 (90 Base) MCG/ACT inhaler Inhale 2 puffs into the lungs every 6 (six) hours as needed for wheezing or shortness of breath. 1 Inhaler 6  . aspirin EC 81 MG tablet Take 81 mg by mouth daily.    Marland Kitchen buPROPion (WELLBUTRIN XL) 300 MG 24 hr tablet Take 1 tablet (300 mg total) by mouth daily. 90 tablet 2  . diclofenac sodium (VOLTAREN) 1 % GEL Apply topically  as needed.    Marland Kitchen escitalopram (LEXAPRO) 20 MG tablet TAKE 1 TABLET BY MOUTH EVERY DAY 90 tablet 2  . glimepiride (AMARYL) 2 MG tablet Take 1 tablet (2 mg total) by mouth daily. 90 tablet 1  . glucose blood (ONETOUCH VERIO) test strip USE TO CHECK BLOOD SUGAR DAILY AND PRN 100 each 12  . levothyroxine (SYNTHROID) 88 MCG tablet Take 1 tablet (88 mcg total) by mouth daily. 90 tablet 1  . PRESCRIPTION MEDICATION Lidocaine 4% Nasal Spray as needed    . simvastatin (ZOCOR) 20 MG  tablet TAKE 1 TABLET BY MOUTH EVERYDAY AT BEDTIME 90 tablet 1  . SitaGLIPtin-MetFORMIN HCl (JANUMET XR) 50-1000 MG TB24 Take 2 tablets by mouth daily. 180 tablet 1  . traZODone (DESYREL) 50 MG tablet TAKE 1 TABLET AT BEDTIME ASNEEDED, MAY REPEAT 1 TIME  IF NEEDED FOR SLEEP (Patient taking differently: 2 (two) times daily. TAKE 1 TABLET AT BEDTIME ASNEEDED, MAY REPEAT 1 TIME  IF NEEDED FOR SLEEP) 90 tablet 1  . ACCU-CHEK FASTCLIX LANCETS MISC Use to test blood sugars daily. Dx: E11.9 100 each 11  . benazepril (LOTENSIN) 20 MG tablet Take 1 tablet (20 mg total) by mouth daily. 90 tablet 1  . Cranberry 300 MG tablet Take by mouth. Take 3 tablets twice a day prn    . NON FORMULARY CBD gummies-Chew 3 pills 1-2 times daily    . sulfamethoxazole-trimethoprim (BACTRIM DS) 800-160 MG tablet Take 1 tablet by mouth 2 (two) times daily. 14 tablet 0  . traZODone (DESYREL) 50 MG tablet Take 1-2 tablets at bedtime as needed for sleep 180 tablet 1   No current facility-administered medications for this visit.      Objective:  BP (!) 148/66 (BP Location: Right Arm, Patient Position: Sitting, Cuff Size: Normal)   Pulse 69   Temp (!) 96.5 F (35.8 C) (Oral)   Ht 5\' 6"  (1.676 m)   Wt 252 lb (114.3 kg)   BMI 40.67 kg/m  Gen: NAD, resting comfortably Lungs: nonlabored, normal respiratory rate  Skin: appears dry, no obvious rash     Assessment and Plan   #Concern for UTI S: Patients symptoms started a few weeks ago. hasnt done anything different- states there than being in pool alot.  Complains of dysuria: mild to moderate burnign; polyuria: yes; nocturia: usually 1-2x; urgency: yes.  Symptoms are worsening over time.  ROS- no fever, chills, nausea, vomiting, flank pain. No blood in urine.  A/P: U[place]. Likely UTI based on symptoms. Would like to get culture but unable due to location of patient. Empiric treatment with: Bactrim DS - she tolerated this well last year.  -  she knows if she has worsening symptoms or ones that fail to improve- needs to seek care in [place] (potentially urgent care) - chose bactrim based on E. Coli UTI last year resistant to keflex, desire to  Avoid quinolones if possible and the fact she did well with bactrim last year.   #hypertension S: controlled on benazepril 20mg  in past- she reviewed current meds and is no longer taking- we reviewed our rx and has not had refill in over 6 months BP Readings from Last 3 Encounters:  06/19/18 (!) 148/66  03/05/18 140/70  01/10/18 134/61  A/P: restart benazepril due to poor control of hypertension- advised follow up next month for review of BP and other chronic conditions  # Diabetes S: well controlled on last check on glimepiride 2  mg, janumet 50-1000mg  XR- two tablets daily CBGs- Has not checked blood sugar- apparently left at another location in [place] Lab Results  Component Value Date   HGBA1C 5.3 11/06/2017   HGBA1C 5.8 06/21/2017   HGBA1C 5.2 11/18/2016    A/P: blood sugar has been well controlled in recent memory but she also has a history of significant swings in a1c due to dietary indescretions- encouraged her to get her meter and begin to check again to make sure not elevating  Advised 2 week video visit Future Appointments  Date Time Provider [place]  10/05/2018  1:40 PM Marin Olp, MD [facility]   Lab/Order associations: Acute cystitis without hematuria  Hypothyroidism - Plan: levothyroxine (SYNTHROID) 88 MCG tablet S: has been controlled recently Lab Results  Component Value Date   TSH 0.59 11/06/2017  A/P: consider updating tsh at next in office visit  Hypertension associated with diabetes ([place])  Meds ordered this encounter  Medications  . sulfamethoxazole-trimethoprim (BACTRIM DS) 800-160 MG tablet    Sig: Take 1 tablet by mouth 2 (two) times daily.    Dispense:  14 tablet    Refill:  0  . benazepril (LOTENSIN) 20 MG tablet    Sig: Take 1  tablet (20 mg total) by mouth daily.    Dispense:  90 tablet    Refill:  1  . SitaGLIPtin-MetFORMIN HCl (JANUMET XR) 50-1000 MG TB24    Sig: Take 2 tablets by mouth daily.    Dispense:  180 tablet    Refill:  1  . traZODone (DESYREL) 50 MG tablet    Sig: Take 1-2 tablets at bedtime as needed for sleep    Dispense:  180 tablet    Refill:  1  . glimepiride (AMARYL) 2 MG tablet    Sig: Take 1 tablet (2 mg total) by mouth daily.    Dispense:  90 tablet    Refill:  1  . levothyroxine (SYNTHROID) 88 MCG tablet    Sig: Take 1 tablet (88 mcg total) by mouth daily.    Dispense:  90 tablet    Refill:  1  . simvastatin (ZOCOR) 20 MG tablet    Sig: TAKE 1 TABLET BY MOUTH EVERYDAY AT BEDTIME    Dispense:  90 tablet    Refill:  1    Return precautions advised.  [name], MD

## 2018-06-19 NOTE — Assessment & Plan Note (Signed)
S: controlled on benazepril 20mg  in past- she reviewed current meds and is no longer taking- we reviewed our rx and has not had refill in over 6 months BP Readings from Last 3 Encounters:  06/19/18 (!) 148/66  03/05/18 140/70  01/10/18 134/61  A/P: restart benazepril 20mg  due to poor control of hypertension- advised follow up next month for review of BP and other chronic conditions

## 2018-06-19 NOTE — Telephone Encounter (Signed)
Pt is scheduled for VV today.

## 2018-06-19 NOTE — Patient Instructions (Addendum)
Health Maintenance Due  Topic Date Due  . HEMOGLOBIN A1C - next in person visit 05/07/2018  . PAP SMEAR-Modifier - physical in august planned 04/30/2018

## 2018-06-20 NOTE — Progress Notes (Signed)
Please schedule

## 2018-07-05 ENCOUNTER — Encounter: Payer: Self-pay | Admitting: Family Medicine

## 2018-07-05 ENCOUNTER — Ambulatory Visit (INDEPENDENT_AMBULATORY_CARE_PROVIDER_SITE_OTHER): Payer: No Typology Code available for payment source | Admitting: Family Medicine

## 2018-07-05 VITALS — BP 137/75 | HR 82 | Temp 96.9°F | Ht 66.0 in | Wt 252.0 lb

## 2018-07-05 DIAGNOSIS — F1021 Alcohol dependence, in remission: Secondary | ICD-10-CM

## 2018-07-05 DIAGNOSIS — E1159 Type 2 diabetes mellitus with other circulatory complications: Secondary | ICD-10-CM | POA: Diagnosis not present

## 2018-07-05 DIAGNOSIS — I10 Essential (primary) hypertension: Secondary | ICD-10-CM

## 2018-07-05 DIAGNOSIS — E119 Type 2 diabetes mellitus without complications: Secondary | ICD-10-CM | POA: Diagnosis not present

## 2018-07-05 NOTE — Progress Notes (Signed)
Phone [phone]   Subjective:  Virtual visit via Video note. Chief complaint: Chief Complaint  Patient presents with  . Urinary Tract Infection    Follow up     This visit type was conducted due to national recommendations for restrictions regarding the COVID-19 Pandemic (e.g. social distancing).  This format is felt to be most appropriate for this patient at this time balancing risks to patient and risks to population by having him in for in person visit.  No physical exam was performed (except for noted visual exam or audio findings with Telehealth visits).    Our team/I connected with [name] [name] at  3:00 PM EDT by a video enabled telemedicine application (doxy.me or caregility through epic) and verified that I am speaking with the correct person using two identifiers.  Location patient: [place] Location provider: [facility], office Persons participating in the virtual visit:  patient  Our team/I discussed the limitations of evaluation and management by telemedicine and the availability of in person appointments. In light of current covid-19 pandemic, patient also understands that we are trying to protect them by minimizing in office contact if at all possible.  The patient expressed consent for telemedicine visit and agreed to proceed. Patient understands insurance will be billed.   ROS- No fever, chills, cough (other than occasional dry cough with benazepril), shortness of breath, body aches, sore throat, or loss of taste or smell   Past Medical History-  Patient Active Problem List   Diagnosis Date Noted  . Solitary pulmonary nodule 06/08/2016    Priority: High  . Alcoholic fatty liver 38/25/0539    Priority: High  . Type II diabetes mellitus, well controlled ([place]) 06/23/2010    Priority: High  . Alcohol use disorder, severe, in sustained remission ([place]) 06/23/2010    Priority: High  . Morbid obesity ([place]) 06/23/2010    Priority: High  . Chronic depression 06/23/2010     Priority: High  . Migraine 07/28/2016    Priority: Medium  . Hypothyroid 07/21/2010    Priority: Medium  . Hypertension associated with diabetes ([place]) 06/23/2010    Priority: Medium  . Hyperlipidemia associated with type 2 diabetes mellitus ([place]) 06/23/2010    Priority: Medium  . Former smoker 07/10/2014    Priority: Low  . Osteoarthritis 05/16/2014    Priority: Low  . Chronic post-traumatic stress disorder (PTSD) 05/02/2014    Priority: Low  . Sleep disorder 05/02/2014    Priority: Low  . Family history of alcoholism 05/02/2014    Priority: Low  . Macrocytosis 04/19/2014    Priority: Low  . Fatigue 08/15/2013    Priority: Low  . Thyroid nodule 09/17/2012    Priority: Low  . History of colonic polyps 04/12/2010    Priority: Low  . Sleep related headaches 09/07/2016  . Nightmares REM-sleep type 09/07/2016  . RLS (restless legs syndrome) 09/07/2016  . Snoring 09/07/2016  . Excessive daytime sleepiness 09/07/2016  . S/P left TKA 05/05/2015  . S/P knee replacement 05/05/2015  . Shortness of breath 06/07/2014  . Chest pain 06/07/2014    Medications- reviewed and updated Current Outpatient Medications  Medication Sig Dispense Refill  . ACCU-CHEK FASTCLIX LANCETS MISC Use to test blood sugars daily. Dx: E11.9 100 each 11  . albuterol (PROVENTIL HFA;VENTOLIN HFA) 108 (90 Base) MCG/ACT inhaler Inhale 2 puffs into the lungs every 6 (six) hours as needed for wheezing or shortness of breath. 1 Inhaler 6  . aspirin EC 81 MG tablet Take 81 mg  by mouth daily.    . benazepril (LOTENSIN) 20 MG tablet Take 1 tablet (20 mg total) by mouth daily. 90 tablet 1  . buPROPion (WELLBUTRIN XL) 300 MG 24 hr tablet Take 1 tablet (300 mg total) by mouth daily. 90 tablet 2  . Cranberry 300 MG tablet Take by mouth. Take 3 tablets twice a day prn    . diclofenac sodium (VOLTAREN) 1 % GEL Apply topically as needed.    Marland Kitchen escitalopram (LEXAPRO) 20 MG tablet TAKE 1 TABLET BY MOUTH EVERY DAY 90 tablet 2   . glimepiride (AMARYL) 2 MG tablet Take 1 tablet (2 mg total) by mouth daily. 90 tablet 1  . glucose blood (ONETOUCH VERIO) test strip USE TO CHECK BLOOD SUGAR DAILY AND PRN 100 each 12  . levothyroxine (SYNTHROID) 88 MCG tablet Take 1 tablet (88 mcg total) by mouth daily. 90 tablet 1  . NON FORMULARY CBD gummies-Chew 3 pills 1-2 times daily    . PRESCRIPTION MEDICATION Lidocaine 4% Nasal Spray as needed    . simvastatin (ZOCOR) 20 MG tablet TAKE 1 TABLET BY MOUTH EVERYDAY AT BEDTIME 90 tablet 1  . SitaGLIPtin-MetFORMIN HCl (JANUMET XR) 50-1000 MG TB24 Take 2 tablets by mouth daily. 180 tablet 1  . sulfamethoxazole-trimethoprim (BACTRIM DS) 800-160 MG tablet Take 1 tablet by mouth 2 (two) times daily. 14 tablet 0  . traZODone (DESYREL) 50 MG tablet TAKE 1 TABLET AT BEDTIME ASNEEDED, MAY REPEAT 1 TIME  IF NEEDED FOR SLEEP (Patient taking differently: 2 (two) times daily. TAKE 1 TABLET AT BEDTIME ASNEEDED, MAY REPEAT 1 TIME  IF NEEDED FOR SLEEP) 90 tablet 1  . traZODone (DESYREL) 50 MG tablet Take 1-2 tablets at bedtime as needed for sleep 180 tablet 1   No current facility-administered medications for this visit.      Objective:  BP 137/75 (BP Location: Left Arm, Patient Position: Sitting, Cuff Size: Normal)   Pulse 82   Temp (!) 96.9 F (36.1 C) (Oral)   Ht 5\' 6"  (1.676 m)   Wt 252 lb (114.3 kg)   BMI 40.67 kg/m  self reported vitals Gen: NAD, resting comfortably Lungs: nonlabored, normal respiratory rate  Skin: appears dry, no obvious rash    Assessment and Plan   #hypertension S: controlled now back on benazepril 20mg .  BP Readings from Last 3 Encounters:  07/05/18 137/75  06/19/18 (!) 148/66  03/05/18 140/70  A/P:  Stable. Continue current medications.    # Diabetes S:  controlled on janumet 50-1000mg  XR- two tablets daily CBGs-  Patient states sugars from 76 to 158. Did have 1 low of 58. Thinks gets lows about once a week- usually eats candy.  Lab Results  Component  Value Date   HGBA1C 5.3 11/06/2017   HGBA1C 5.8 06/21/2017   HGBA1C 5.2 11/18/2016   A/P: Sounds like patient has reasonable control but I am concerned about the hypoglycemia - instructed to take half of glimepiride 2mg  but if still having lows within 2 weeks go ahead and stop completely and get a1c when [name] [name] is back in town  #Morbid obesity S: Weight largely stable over the last few weeks-down 4 pounds from last visit here but may be lighter on home scales due to less close.  [name] [name] admits [name] [name] Would like to shift weight down more- trying to get in the pool. Could ride bike.  A/P: Poor control- Encouraged need for healthy eating, regular exercise, weight loss.  Specifically [name] [name] is going to try to do  intentional exercise in the pool or her ride her bike  Pap smear at next visit hopefully- within 6 months if [name] [name] is back in town Future Appointments  Date Time Provider [place]  10/05/2018  1:40 PM Marin Olp, MD [facility]   Lab/Order associations: Hypertension associated with diabetes ([place])  Alcohol use disorder, severe, in sustained remission ([place])  Type II diabetes mellitus, well controlled ([place])  Morbid obesity ([place])  Return precautions advised.  [name], MD

## 2018-07-05 NOTE — Patient Instructions (Signed)
Health Maintenance Due  Topic Date Due  . PAP SMEAR-Modifier  04/30/2018  . FOOT EXAM  06/29/2018    Depression screen [facility] 2/9 06/19/2018 11/13/2017 06/28/2017  Decreased Interest 1 0 0  Down, Depressed, Hopeless 0 - 0  PHQ - 2 Score 1 0 0  Altered sleeping 0 3 3  Tired, decreased energy 1 3 3   Change in appetite 0 1 2  Feeling bad or failure about yourself  0 0 0  Trouble concentrating 1 2 1   Moving slowly or fidgety/restless 0 0 0  Suicidal thoughts 0 0 0  PHQ-9 Score 3 9 9   Difficult doing work/chores Not difficult at all Very difficult Somewhat difficult  Some recent data might be hidden

## 2018-07-07 NOTE — Assessment & Plan Note (Signed)
S:4 years in march alcohol free A/P: I congratulated patient on her continued progress- current condition is stable and in remission-continue to abstain from alcohol

## 2018-08-03 ENCOUNTER — Encounter: Payer: Self-pay | Admitting: Family Medicine

## 2018-08-12 ENCOUNTER — Other Ambulatory Visit: Payer: Self-pay | Admitting: Family Medicine

## 2018-08-16 ENCOUNTER — Other Ambulatory Visit: Payer: Self-pay | Admitting: Family Medicine

## 2018-08-16 DIAGNOSIS — E039 Hypothyroidism, unspecified: Secondary | ICD-10-CM

## 2018-10-05 ENCOUNTER — Encounter: Payer: No Typology Code available for payment source | Admitting: Family Medicine

## 2018-11-15 ENCOUNTER — Other Ambulatory Visit: Payer: Self-pay | Admitting: Family Medicine

## 2018-11-29 ENCOUNTER — Other Ambulatory Visit: Payer: Self-pay | Admitting: Family Medicine

## 2018-12-02 ENCOUNTER — Other Ambulatory Visit: Payer: Self-pay | Admitting: Family Medicine

## 2019-01-04 ENCOUNTER — Telehealth: Payer: Self-pay | Admitting: Family Medicine

## 2019-01-04 NOTE — Telephone Encounter (Signed)
Patient returning call to [facility]. States that she would have to call insurance and find out where they want labs done and she would give a call back. Please advise.

## 2019-01-04 NOTE — Telephone Encounter (Signed)
Returned pt call and lmtcb to see where her insurance is requesting her to have her labs done.

## 2019-01-04 NOTE — Telephone Encounter (Signed)
Patient would like to see if she can have her orders for labs put in her before appt. Since her insurance makes her get her labs done elsewhere.

## 2019-01-07 NOTE — Telephone Encounter (Signed)
See note

## 2019-01-14 NOTE — Telephone Encounter (Signed)
[facility] on [address], [place] [address]  Phone: [phone]

## 2019-01-14 NOTE — Telephone Encounter (Signed)
Called l/m number given does not work need to see what location she wants so we can fax orders.

## 2019-01-14 NOTE — Telephone Encounter (Signed)
See note

## 2019-01-14 NOTE — Telephone Encounter (Signed)
Pt is calling to let keba know  [facility]   phone number is  [phone]

## 2019-01-15 ENCOUNTER — Other Ambulatory Visit: Payer: Self-pay

## 2019-01-15 NOTE — Telephone Encounter (Signed)
Labs faxed

## 2019-01-22 LAB — HM DIABETES EYE EXAM

## 2019-01-23 ENCOUNTER — Other Ambulatory Visit: Payer: Self-pay

## 2019-01-23 ENCOUNTER — Encounter: Payer: Self-pay | Admitting: Family Medicine

## 2019-01-23 DIAGNOSIS — E039 Hypothyroidism, unspecified: Secondary | ICD-10-CM

## 2019-01-23 MED ORDER — GLIMEPIRIDE 2 MG PO TABS: 2.0000 mg | ORAL_TABLET | Freq: Every day | ORAL | 2 refills | Status: DC

## 2019-01-23 MED ORDER — LEVOTHYROXINE SODIUM 88 MCG PO TABS: 88.0000 ug | ORAL_TABLET | Freq: Every day | ORAL | 2 refills | Status: DC

## 2019-01-23 MED ORDER — ESCITALOPRAM OXALATE 20 MG PO TABS: 20.0000 mg | ORAL_TABLET | Freq: Every day | ORAL | 2 refills | Status: DC

## 2019-01-24 ENCOUNTER — Encounter (INDEPENDENT_AMBULATORY_CARE_PROVIDER_SITE_OTHER): Payer: Self-pay | Admitting: Otolaryngology

## 2019-01-24 ENCOUNTER — Ambulatory Visit (INDEPENDENT_AMBULATORY_CARE_PROVIDER_SITE_OTHER): Payer: No Typology Code available for payment source | Admitting: Otolaryngology

## 2019-01-24 ENCOUNTER — Other Ambulatory Visit: Payer: Self-pay

## 2019-01-24 VITALS — Temp 99.0°F

## 2019-01-24 DIAGNOSIS — J310 Chronic rhinitis: Secondary | ICD-10-CM

## 2019-01-24 NOTE — Progress Notes (Signed)
HPI: [name] [name] is a 63 y.o. female who returns today for evaluation of ear discomfort.  She has been down in [place] with her mother for several months and did a lot of swimming.  She has been having some ear pain worse on the right side.  She has not had any drainage from her ears.  No hearing problems.  She also has history of sinus problems with some drainage but this has been clear.  No fevers..  Past Medical History:  Diagnosis Date  . Acute meniscal tear of knee    Left knee  . Alcohol problem drinking    rehab  . Anemia    menstrual related  . Anxiety   . Arthritis    right ankle-neuropathy  . Colon polyps   . Depression   . DM (diabetes mellitus), type 2 ([place]) 12/2009   type 2  . Evalluate for OSA (obstructive sleep apnea) 08/15/2013   No OSA on sleep study but may be underestimated.-never used cpap    . GERD (gastroesophageal reflux disease)    not currently taking.  Marland Kitchen Heart murmur   . History of recurrent UTIs   . Hyperlipidemia   . Hypertension   . Hypothyroidism   . Liver disease   . Neuromuscular disorder ([place])    neropathy bilateral feet.  . 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 Shortness of breath    with exertion   . Thyroid disease    Past Surgical History:  Procedure Laterality Date  . ADENOIDECTOMY    . BREAST SURGERY Right    lumpectomy-benign  . [place]   1 time  . CHOLECYSTECTOMY    . INNER EAR SURGERY     left ear had tubes put in and removed, scar tissue built up/ loss of hearing in left ear for over a year  . MASS EXCISION Left 12/16/2013   Procedure: EXCISION LEFT  DISTAL THIGH MASS;  Surgeon: Mauri Pole, MD;  Location: [facility];  Service: Orthopedics;  Laterality: Left;  . right ankle surgery      x 2- no retained hardware  . right rotator cuff surgery      left side  . TONSILLECTOMY    . TOTAL KNEE ARTHROPLASTY  2009   right  . TOTAL KNEE ARTHROPLASTY Left 05/05/2015    Procedure: LEFT TOTAL KNEE ARTHROPLASTY;  Surgeon: Paralee Cancel, MD;  Location: [facility];  Service: Orthopedics;  Laterality: Left;  . TOTAL KNEE REVISION Right 12/16/2013   Procedure: RIGHT TOTAL KNEE REVISION POLY EXCHANGE;  Surgeon: Mauri Pole, MD;  Location: [facility];  Service: Orthopedics;  Laterality: Right;  . TUBAL LIGATION  1987   Social History   Socioeconomic History  . Marital status: Married    Spouse name: Not on file  . Number of children: Not on file  . Years of education: Not on file  . Highest education level: Not on file  Occupational History  . Not on file  Social Needs  . Financial resource strain: Not on file  . Food insecurity    Worry: Not on file    Inability: Not on file  . Transportation needs    Medical: Not on file    Non-medical: Not on file  Tobacco Use  . Smoking status: Former Smoker    Packs/day: 1.50    Years: 30.00    Pack years: 45.00    Types: Cigarettes  Start date: 26    Quit date: 02/21/2009    Years since quitting: 9.9  . Smokeless tobacco: Never Used  Substance and Sexual Activity  . Alcohol use: Not Currently  . Drug use: No    Types: Marijuana    Comment: marijuana in past  . Sexual activity: Yes  Lifestyle  . Physical activity    Days per week: Not on file    Minutes per session: Not on file  . Stress: Not on file  Relationships  . Social Herbalist on phone: Not on file    Gets together: Not on file    Attends religious service: Not on file    Active member of club or organization: Not on file    Attends meetings of clubs or organizations: Not on file    Relationship status: Not on file  Other Topics Concern  . Not on file  Social History Narrative   Married. 1 daughter. No grandkids. 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       Hobbies: shopping, travel   Family History  Adopted: Yes  Problem Relation Age of Onset  . COPD Mother   . Alcohol abuse Father     Allergies  Allergen Reactions  . Adhesive [Tape] Other (See Comments)    blisters  . Nitrofurantoin     Diffuse rash  . Other Other (See Comments)    (Neoprene)--causes blisters.    Prior to Admission medications   Medication Sig Start Date End Date Taking? Authorizing Provider  albuterol (PROVENTIL HFA;VENTOLIN HFA) 108 (90 Base) MCG/ACT inhaler Inhale 2 puffs into the lungs every 6 (six) hours as needed for wheezing or shortness of breath. 11/29/17  Yes Olalere, Adewale A, MD  aspirin EC 81 MG tablet Take 81 mg by mouth daily.   Yes [provider]  benazepril (LOTENSIN) 20 MG tablet TAKE 1 TABLET BY MOUTH EVERY DAY 11/30/18  Yes Marin Olp, MD  buPROPion (WELLBUTRIN XL) 300 MG 24 hr tablet TAKE 1 TABLET BY MOUTH EVERY DAY 11/15/18  Yes Marin Olp, MD  Cranberry 300 MG tablet Take by mouth. Take 3 tablets twice a day prn   Yes [provider]  diclofenac sodium (VOLTAREN) 1 % GEL Apply topically as needed.   Yes [provider]  escitalopram (LEXAPRO) 20 MG tablet Take 1 tablet (20 mg total) by mouth daily. 01/23/19  Yes Marin Olp, MD  glimepiride (AMARYL) 2 MG tablet Take 1 tablet (2 mg total) by mouth daily. 01/23/19  Yes Marin Olp, MD  glucose blood [facility] VERIO) test strip USE TO CHECK BLOOD SUGAR DAILY AND PRN 02/25/16  Yes Marin Olp, MD  JANUMET XR 50-1000 MG TB24 TAKE 2 TABLETS BY MOUTH EVERY DAY 12/03/18  Yes Marin Olp, MD  levothyroxine (SYNTHROID) 88 MCG tablet Take 1 tablet (88 mcg total) by mouth daily. 01/23/19  Yes Marin Olp, MD  NON FORMULARY CBD gummies-Chew 3 pills 1-2 times daily   Yes [provider]  simvastatin (ZOCOR) 20 MG tablet TAKE 1 TABLET BY MOUTH EVERYDAY AT BEDTIME 06/19/18  Yes Marin Olp, MD  sulfamethoxazole-trimethoprim (BACTRIM DS) 800-160 MG tablet Take 1 tablet by mouth 2 (two) times daily. 06/19/18  Yes Marin Olp, MD  traZODone (DESYREL) 50 MG tablet  TAKE 1 TABLET AT BEDTIME ASNEEDED, MAY REPEAT 1 TIME  IF NEEDED FOR SLEEP Patient taking differently:  2 (two) times daily. TAKE 1 TABLET AT BEDTIME ASNEEDED, MAY REPEAT 1 TIME  IF NEEDED FOR SLEEP 02/08/17  Yes Marin Olp, MD  traZODone (DESYREL) 50 MG tablet Take 1-2 tablets at bedtime as needed for sleep 06/19/18  Yes Marin Olp, MD  ACCU-CHEK FASTCLIX LANCETS MISC Use to test blood sugars daily. Dx: E11.9 10/09/14   Marin Olp, MD  PRESCRIPTION MEDICATION Lidocaine 4% Nasal Spray as needed    Michel Santee, MD     Positive ROS: Otherwise negative  All other systems have been reviewed and were otherwise negative with the exception of those mentioned in the HPI and as above.  Physical Exam: Constitutional: Alert, well-appearing, no acute distress Ears: External ears without lesions or tenderness. Ear canals are clear bilaterally.  No inflammatory changes of the right ear canal or the TM.  TM had good mobility on pneumatic otoscopy and AC > BC on the right side.  On the left side she has some chronic granulation tissue anteriorly on the left TM.  A little bit of crusting superiorly that was removed in the office.  The TM itself is otherwise clear.  I applied CSF powder to the left ear canal and TM. Nasal: External nose without lesions. Septum mildly deviated.. Clear nasal passages.  Both middle meatus regions are clear with no signs of infection.  Mild rhinitis. Oral: Lips and gums without lesions. Tongue and palate mucosa without lesions. Posterior oropharynx clear. Neck: No palpable adenopathy or masses Respiratory: Breathing comfortably  Skin: No facial/neck lesions or rash noted.  Procedures  Assessment: Chronic rhinitis. Otalgia  Plan: Recommended regular use of the nasal steroid spray Nasacort or Flonase.  As well as saline rinses and suggested trying Xlear. She will follow-up as needed. Also prescribed Cortisporin otic suspension drops 3 to 4 drops in the ear  twice daily any pain or drainage.   Radene Journey, MD

## 2019-01-25 ENCOUNTER — Other Ambulatory Visit: Payer: Self-pay | Admitting: Family Medicine

## 2019-01-26 LAB — CBC
HCT: 41.3 % (ref 35.0–45.0)
Hemoglobin: 14.5 g/dL (ref 11.7–15.5)
MCH: 33.3 pg — ABNORMAL HIGH (ref 27.0–33.0)
MCHC: 35.1 g/dL (ref 32.0–36.0)
MCV: 94.9 fL (ref 80.0–100.0)
MPV: 8.9 fL (ref 7.5–12.5)
Platelets: 204 10*3/uL (ref 140–400)
RBC: 4.35 10*6/uL (ref 3.80–5.10)
RDW: 12.5 % (ref 11.0–15.0)
WBC: 5.8 10*3/uL (ref 3.8–10.8)

## 2019-01-26 LAB — BASIC METABOLIC PANEL WITH GFR
BUN/Creatinine Ratio: 19 (calc) (ref 6–22)
BUN: 19 mg/dL (ref 7–25)
CO2: 25 mmol/L (ref 20–32)
Calcium: 10.1 mg/dL (ref 8.6–10.4)
Chloride: 105 mmol/L (ref 98–110)
Creat: 1.01 mg/dL — ABNORMAL HIGH (ref 0.50–0.99)
GFR, Est African American: 68 mL/min/{1.73_m2} (ref >=60)
GFR, Est Non African American: 59 mL/min/{1.73_m2} — ABNORMAL LOW (ref >=60)
Glucose, Bld: 119 mg/dL — ABNORMAL HIGH (ref 65–99)
Potassium: 4.4 mmol/L (ref 3.5–5.3)
Sodium: 140 mmol/L (ref 135–146)

## 2019-01-26 LAB — LIPID PANEL
Cholesterol: 120 mg/dL (ref <200)
HDL: 60 mg/dL (ref >=50)
LDL Cholesterol (Calc): 46 mg/dL (calc)
Non-HDL Cholesterol (Calc): 60 mg/dL (calc) (ref <130)
Total CHOL/HDL Ratio: 2 (calc) (ref <5.0)
Triglycerides: 56 mg/dL (ref <150)

## 2019-01-26 LAB — HEPATIC FUNCTION PANEL
AG Ratio: 1.7 (calc) (ref 1.0–2.5)
ALT: 11 U/L (ref 6–29)
AST: 17 U/L (ref 10–35)
Albumin: 3.9 g/dL (ref 3.6–5.1)
Alkaline phosphatase (APISO): 57 U/L (ref 37–153)
Bilirubin, Direct: 0.4 mg/dL — ABNORMAL HIGH (ref 0.0–0.2)
Globulin: 2.3 g/dL (calc) (ref 1.9–3.7)
Indirect Bilirubin: 1 mg/dL (calc) (ref 0.2–1.2)
Total Bilirubin: 1.4 mg/dL — ABNORMAL HIGH (ref 0.2–1.2)
Total Protein: 6.2 g/dL (ref 6.1–8.1)

## 2019-01-26 LAB — HEMOGLOBIN A1C W/OUT EAG: Hgb A1c MFr Bld: 4.8 % of total Hgb (ref <5.7)

## 2019-01-26 LAB — TSH: TSH: 1.4 mIU/L (ref 0.40–4.50)

## 2019-01-29 ENCOUNTER — Other Ambulatory Visit: Payer: Self-pay

## 2019-01-29 MED ORDER — BUPROPION HCL ER (XL) 300 MG PO TB24: 300.0000 mg | ORAL_TABLET | Freq: Every day | ORAL | 0 refills | Status: DC

## 2019-02-05 ENCOUNTER — Encounter: Payer: Self-pay | Admitting: Family Medicine

## 2019-02-05 LAB — HM DIABETES EYE EXAM

## 2019-02-07 ENCOUNTER — Other Ambulatory Visit: Payer: Self-pay

## 2019-02-07 ENCOUNTER — Other Ambulatory Visit: Payer: Self-pay | Admitting: Family Medicine

## 2019-02-08 ENCOUNTER — Other Ambulatory Visit (HOSPITAL_COMMUNITY)
Admission: RE | Admit: 2019-02-08 | Discharge: 2019-02-08 | Disposition: A | Discharge status: Home / Self Care | Payer: No Typology Code available for payment source | Source: Ambulatory Visit | Attending: Family Medicine | Admitting: Family Medicine

## 2019-02-08 ENCOUNTER — Ambulatory Visit (INDEPENDENT_AMBULATORY_CARE_PROVIDER_SITE_OTHER): Payer: No Typology Code available for payment source | Admitting: Family Medicine

## 2019-02-08 ENCOUNTER — Encounter: Payer: Self-pay | Admitting: Family Medicine

## 2019-02-08 VITALS — BP 120/60 | HR 82 | Temp 97.7°F | Ht 66.0 in | Wt 245.4 lb

## 2019-02-08 DIAGNOSIS — E039 Hypothyroidism, unspecified: Secondary | ICD-10-CM

## 2019-02-08 DIAGNOSIS — F329 Major depressive disorder, single episode, unspecified: Secondary | ICD-10-CM

## 2019-02-08 DIAGNOSIS — K700 Alcoholic fatty liver: Secondary | ICD-10-CM

## 2019-02-08 DIAGNOSIS — E119 Type 2 diabetes mellitus without complications: Secondary | ICD-10-CM

## 2019-02-08 DIAGNOSIS — Z124 Encounter for screening for malignant neoplasm of cervix: Secondary | ICD-10-CM | POA: Diagnosis not present

## 2019-02-08 DIAGNOSIS — F1021 Alcohol dependence, in remission: Secondary | ICD-10-CM

## 2019-02-08 DIAGNOSIS — E785 Hyperlipidemia, unspecified: Secondary | ICD-10-CM

## 2019-02-08 DIAGNOSIS — R300 Dysuria: Secondary | ICD-10-CM

## 2019-02-08 DIAGNOSIS — E1159 Type 2 diabetes mellitus with other circulatory complications: Secondary | ICD-10-CM

## 2019-02-08 DIAGNOSIS — G43809 Other migraine, not intractable, without status migrainosus: Secondary | ICD-10-CM

## 2019-02-08 DIAGNOSIS — Z23 Encounter for immunization: Secondary | ICD-10-CM

## 2019-02-08 DIAGNOSIS — Z0000 Encounter for general adult medical examination without abnormal findings: Secondary | ICD-10-CM

## 2019-02-08 DIAGNOSIS — Z1231 Encounter for screening mammogram for malignant neoplasm of breast: Secondary | ICD-10-CM | POA: Diagnosis not present

## 2019-02-08 DIAGNOSIS — I10 Essential (primary) hypertension: Secondary | ICD-10-CM

## 2019-02-08 DIAGNOSIS — F32A Depression, unspecified: Secondary | ICD-10-CM

## 2019-02-08 DIAGNOSIS — E1169 Type 2 diabetes mellitus with other specified complication: Secondary | ICD-10-CM

## 2019-02-08 DIAGNOSIS — I152 Hypertension secondary to endocrine disorders: Secondary | ICD-10-CM

## 2019-02-08 MED ORDER — BUPROPION HCL ER (XL) 150 MG PO TB24: 150.0000 mg | ORAL_TABLET | Freq: Every day | ORAL | 3 refills | Status: DC

## 2019-02-08 NOTE — Assessment & Plan Note (Signed)
Type II diabetes mellitus, well controlled (HCC)-patient compliant with glimepiride 2 mg (went back up to full pill but no lows lately) and Janumet 50-1000 mg 2 tablets daily Lab Results  Component Value Date   HGBA1C 4.8 01/25/2019   HGBA1C 5.3 11/06/2017   HGBA1C 5.8 06/21/2017  - excellent control. Has not had lows but to help avoid we will stop glimepiride.

## 2019-02-08 NOTE — Patient Instructions (Addendum)
Health Maintenance Due  Topic Date Due  . PAP SMEAR- getting today in office  04/30/2018  . MAMMOGRAM order placed contact information given to patient  12/31/2018   Thanks for already doing labs. We are adding urine tests today- make sure to chat with lab and make sure they send to [facility].   Shingrix #1 today. Repeat injection in 2-5 months. Schedule a nurse visit for the 2nd injection before you leave today (at the check out desk)  discussed trial of omeprazole over the counter for potential reflux- if no better in 2-3 weeks she will contact us and we will set up RUQ ultrasound especially with bilirubin elevation.   Team [id number] popping up now-   Recommended follow up: Return in about 6 months (around 08/09/2019) for follow up- or sooner if needed.

## 2019-02-08 NOTE — Assessment & Plan Note (Signed)
S: Compliant with Lexapro and Wellbutrin 300 mg with reasonable control  A/P: reasonable control- she would like to come off . We opted to reduce wellbutrin to 150mg  XR and continue lexapro at 20mg  for now- if doing well in 6 months may try to further reduce.

## 2019-02-08 NOTE — Addendum Note (Signed)
Addended by: Jasper Loser on: 02/08/2019 02:38 PM   Modules accepted: Orders

## 2019-02-08 NOTE — Progress Notes (Signed)
Phone: [phone]   Subjective:  Patient presents today for their annual physical. Chief complaint-noted.   See problem oriented charting- Review of Systems  Constitutional: Negative.   HENT: Positive for hearing loss.        Has hearing aids   Eyes: Negative.   Respiratory: Positive for wheezing.        Has history of asthma  Has C pap but does not use x 5 mo  Cardiovascular: Negative.   Gastrointestinal: Positive for abdominal pain.       X 1 yr associated with eating   Genitourinary: Positive for dysuria, frequency and urgency.       Thinks that she has UTI x 1 month   Skin: Positive for itching.       Right arm very itchy for 10 months   Neurological: Negative.   Endo/Heme/Allergies: Negative.   Psychiatric/Behavioral: Negative.     The following were reviewed and entered/updated in epic: Past Medical History:  Diagnosis Date  . Acute meniscal tear of knee    Left knee  . Alcohol problem drinking    rehab  . Anemia    menstrual related  . Anxiety   . Arthritis    right ankle-neuropathy  . Colon polyps   . Depression   . DM (diabetes mellitus), type 2 ([place]) 12/2009   type 2  . Evalluate for OSA (obstructive sleep apnea) 08/15/2013   No OSA on sleep study but may be underestimated.-never used cpap    . GERD (gastroesophageal reflux disease)    not currently taking.  Marland Kitchen Heart murmur   . History of recurrent UTIs   . Hyperlipidemia   . Hypertension   . Hypothyroidism   . Liver disease   . Neuromuscular disorder ([place])    neropathy bilateral feet.  . 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 Shortness of breath    with exertion   . Thyroid disease    Patient Active Problem List   Diagnosis Date Noted  . Solitary pulmonary nodule 06/08/2016    Priority: High  . Alcoholic fatty liver 78/58/8502    Priority: High  . Type II diabetes mellitus, well controlled ([place]) 06/23/2010    Priority: High  . Alcohol use disorder,  severe, in sustained remission ([place]) 06/23/2010    Priority: High  . Morbid obesity ([place]) 06/23/2010    Priority: High  . Chronic depression 06/23/2010    Priority: High  . Migraine 07/28/2016    Priority: Medium  . Hypothyroid 07/21/2010    Priority: Medium  . Hypertension associated with diabetes ([place]) 06/23/2010    Priority: Medium  . Hyperlipidemia associated with type 2 diabetes mellitus ([place]) 06/23/2010    Priority: Medium  . S/P knee replacement 05/05/2015    Priority: Low  . Former smoker 07/10/2014    Priority: Low  . Osteoarthritis 05/16/2014    Priority: Low  . Chronic post-traumatic stress disorder (PTSD) 05/02/2014    Priority: Low  . Sleep disorder 05/02/2014    Priority: Low  . Family history of alcoholism 05/02/2014    Priority: Low  . Macrocytosis 04/19/2014    Priority: Low  . Fatigue 08/15/2013    Priority: Low  . Thyroid nodule 09/17/2012    Priority: Low  . History of colonic polyps 04/12/2010    Priority: Low  . Osteoarthritis of ankle, right 01/29/2018  . Disorder of tendon of posterior tibial muscle 07/06/2017  . Sleep related headaches  09/07/2016  . Nightmares REM-sleep type 09/07/2016  . RLS (restless legs syndrome) 09/07/2016  . Snoring 09/07/2016  . Excessive daytime sleepiness 09/07/2016  . Shortness of breath 06/07/2014  . Chest pain 06/07/2014   Past Surgical History:  Procedure Laterality Date  . ADENOIDECTOMY    . BREAST SURGERY Right    lumpectomy-benign  . [place]   1 time  . CHOLECYSTECTOMY    . INNER EAR SURGERY     left ear had tubes put in and removed, scar tissue built up/ loss of hearing in left ear for over a year  . MASS EXCISION Left 12/16/2013   Procedure: EXCISION LEFT  DISTAL THIGH MASS;  Surgeon: Mauri Pole, MD;  Location: [facility];  Service: Orthopedics;  Laterality: Left;  . right ankle surgery      x 2- no retained hardware  . right rotator cuff surgery      left side  . TONSILLECTOMY    .  TOTAL KNEE ARTHROPLASTY  2009   right  . TOTAL KNEE ARTHROPLASTY Left 05/05/2015   Procedure: LEFT TOTAL KNEE ARTHROPLASTY;  Surgeon: Paralee Cancel, MD;  Location: [facility];  Service: Orthopedics;  Laterality: Left;  . TOTAL KNEE REVISION Right 12/16/2013   Procedure: RIGHT TOTAL KNEE REVISION POLY EXCHANGE;  Surgeon: Mauri Pole, MD;  Location: [facility];  Service: Orthopedics;  Laterality: Right;  . TUBAL LIGATION  1987    Family History  Adopted: Yes  Problem Relation Age of Onset  . COPD Mother   . Alcohol abuse Father     Medications- reviewed and updated Current Outpatient Medications  Medication Sig Dispense Refill  . albuterol (PROVENTIL HFA;VENTOLIN HFA) 108 (90 Base) MCG/ACT inhaler Inhale 2 puffs into the lungs every 6 (six) hours as needed for wheezing or shortness of breath. 1 Inhaler 6  . aspirin EC 81 MG tablet Take 81 mg by mouth daily.    . benazepril (LOTENSIN) 20 MG tablet TAKE 1 TABLET BY MOUTH EVERY DAY 90 tablet 1  . buPROPion (WELLBUTRIN XL) 300 MG 24 hr tablet Take 1 tablet (300 mg total) by mouth daily. 90 tablet 0  . Cranberry 300 MG tablet Take by mouth. Take 3 tablets twice a day prn    . diclofenac sodium (VOLTAREN) 1 % GEL Apply topically as needed.    Marland Kitchen glimepiride (AMARYL) 2 MG tablet Take 1 tablet (2 mg total) by mouth daily. 90 tablet 2  . glucose blood (ONETOUCH VERIO) test strip USE TO CHECK BLOOD SUGAR DAILY AND PRN 100 each 12  . JANUMET XR 50-1000 MG TB24 TAKE 2 TABLETS BY MOUTH EVERY DAY 180 tablet 1  . levothyroxine (SYNTHROID) 88 MCG tablet Take 1 tablet (88 mcg total) by mouth daily. 90 tablet 2  . NON FORMULARY CBD gummies-Chew 3 pills 1-2 times daily    . simvastatin (ZOCOR) 20 MG tablet TAKE 1 TABLET BY MOUTH EVERYDAY AT BEDTIME 90 tablet 1   No current facility-administered medications for this visit.    Allergies-reviewed and updated Allergies  Allergen Reactions  . Adhesive [Tape] Other (See Comments)    blisters  . Nitrofurantoin       Diffuse rash  . Other Other (See Comments)    (Neoprene)--causes blisters.     Social History   Social History Narrative   Married. 1 daughter. No grandkids. 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  Hobbies: shopping, travel   Objective  Objective:  BP 120/60   Pulse 82   Temp 97.7 F (36.5 C) (Temporal)   Ht '5\' 6"'  (1.676 m)   Wt 245 lb 6.4 oz (111.3 kg)   SpO2 95%   BMI 39.61 kg/m  Gen: NAD, resting comfortably HEENT: Mask not removed due to covid 19. TM normal on right, TM scarring noted on the left. Bridge of nose normal. Eyelids normal.  Neck: no thyromegaly or cervical lymphadenopathy  CV: RRR no murmurs rubs or gallops Lungs: CTAB no crackles, wheeze, rhonchi Abdomen: soft/nontender/nondistended/normal bowel sounds. No rebound or guarding.  Ext: no edema Skin: warm, dry Neuro: grossly normal, moves all extremities, PERRLA Pelvic: cervix normal in appearance, external genitalia normal, no adnexal masses or tenderness, uterus normal size, shape, and consistency and vagina normal without discharge    Assessment and Plan   63 y.o. female presenting for annual physical.  Health Maintenance counseling: 1. Anticipatory guidance: Patient counseled regarding regular dental exams q6 months, eye exams- once a year ,  avoiding smoking and second hand smoke , limiting alcohol to 0/day given history of alcoholism-he continues to avoid alcohol- 5 years in march!  2. Risk factor reduction:  Advised patient of need for regular exercise and diet rich and fruits and vegetables to reduce risk of heart attack and stroke. Exercise- does not exercise at all. She does try to do things around the house- pool is really helpful for her when [place]- easier on joints. . Diet-does not follow any special diet but had cut back on portions.  Weight is down 7 pounds Wt Readings from Last 3 Encounters:  02/08/19 245 lb 6.4 oz (111.3 kg)  07/05/18 252 lb  (114.3 kg)  06/19/18 252 lb (114.3 kg)  3. Immunizations/screenings/ancillary studies-discussed Shingrix and opts in   Immunization History  Administered Date(s) Administered  . Influenza Split 01/06/2011  . Influenza, Quadrivalent, Recombinant, Inj, Pf 11/02/2018  . Influenza,inj,Quad PF,6+ Mos 12/14/2012, 11/21/2013, 11/06/2014, 11/28/2016, 11/13/2017  . Influenza-Unspecified 11/27/2015  . Pneumococcal Conjugate-13 10/17/2013  . Pneumococcal Polysaccharide-23 03/13/2015  . Pneumococcal-Unspecified 02/21/2006  . Tdap 06/06/2012  . Zoster 10/09/2014   4. Cervical cancer screening- Will have today  5. Breast cancer screening-  breast exam "once in a great while" and mammogram has been ordered and information given to patient to call for appointment.  6. Colon cancer screening - last one 01/10/2018 next due 01/11/2023 due to polyps  7. Skin cancer screening- advised regular sunscreen use. Denies worrisome, changing, or new skin lesions. Patient has had ongoing itching on right arm. Patient has two areas one on forehead and one on nose that get dry and flake that she would like to locate today-possible early AK on forehead- she prefers to monitor over cryotherapy  8. Birth control/STD check- Married declines STD exam.  Postmenopausal 9. Osteoporosis screening at 65-we will plan on this next year -Former smoker quit in 2011. At that time she was smoking 1 1/2 ppd.  We will get urinalysis with next labs  Status of chronic or acute concerns   Burning with urination about a month- concerned about UTI. Will get UA and culture- wants to wait on treatment once we confirm UTI/bacteria. In general feels like cranberry pills have helped.   May be on border of CKD stage III with history of celebrex but has remained off- continue to monitor.   Type II diabetes mellitus, well controlled (HCC)-patient compliant with glimepiride 2 mg (went back  up to full pill but no lows lately) and Janumet 50-1000 mg 2  tablets daily Lab Results  Component Value Date   HGBA1C 4.8 01/25/2019   HGBA1C 5.3 11/06/2017   HGBA1C 5.8 06/21/2017  - excellent control. Has not had lows but to help avoid we will stop glimepiride.    Morbid obesity (HCC)-BMI over 17 with hypertension, hyperlipidemia, diabetes.  We discussed importance of weight loss- she is working on this.   Alcohol use disorder, severe, in sustained remission (HCC)-congratulated patient on sustained remission- almost 5 years alcohol free!    Alcoholic fatty liver-glad patient has been able to avoid alcohol.  Update LFTs.  Labs largely normal other than slightly increased bilirubin  Hepatic Function Latest Ref Rng & Units 01/25/2019 11/06/2017 11/18/2016  Total Protein 6.1 - 8.1 g/dL 6.2 - 6.3  Albumin 3.5 - 5.2 g/dL - - -  AST 10 - 35 U/L '17 17 15  ' ALT 6 - 29 U/L '11 11 11  ' Alk Phosphatase 25 - 125 - 60 -  Total Bilirubin 0.2 - 1.2 mg/dL 1.4(H) - 1.0  Bilirubin, Direct 0.0 - 0.2 mg/dL 0.4(H) - -    Hypothyroidism-controlled with current medication as above   Hypertension associated with diabetes (HCC)-controlled with current medication as above-benazepril 20 mg  Hyperlipidemia associated with type 2 diabetes mellitus (HCC)-patient is compliant with statin and LDL well controlled  Lab Results  Component Value Date   CHOL 120 01/25/2019   HDL 60 01/25/2019   LDLCALC 46 01/25/2019   LDLDIRECT 158.6 02/26/2014   TRIG 56 01/25/2019   CHOLHDL 2.0 01/25/2019    Other migraine without status migrainosus, not intractable-doing well recently- cbd oil and thc helps (medical marijuana card in [place])   # Depression/anxiety S: Compliant with Lexapro and Wellbutrin 300 mg with reasonable control  A/P: reasonable control- she would like to come off . We opted to reduce wellbutrin to 174m XR and continue lexapro at 288mfor now- if doing well in 6 months may try to further reduce.   Wants to do labs when she comes in next time- cmp, tsh, a1c  Has  some epigastric pain- 4/10 at its worst. Ok[place]hen she eats and after eating comes back. Also has some nausea and sometimes makes her not want to eat.  - discussed trial of omeprazole over the counter for potential reflux- if no better in 2-3 weeks she will contact [place]ore[place] we will set up RUQ ultrasound especially with bilirubin elevation.   Recommended follow up: Return in about 6 months (around 08/09/2019) for follow up- or sooner if needed.  Lab/Order associations:Not fasting ate at 12:00 but already had labs   ICD-10-CM   1. Preventative health care  Z00.00   2. Screening mammogram, encounter for  Z12.31 MM SCREENING BREAST TOMO BILATERAL  3. Papanicolaou smear for cervical cancer screening  Z12.4 Cytology - PAP for [id number] old woman  4. Type II diabetes mellitus, well controlled ([facility] E11.9   5. Morbid obesity ([facility] [place] E66.01   6. Alcohol use disorder, severe, in sustained remission ([facility] F10.21   7. Alcoholic fatty liver  K7H67.5 8. Hypothyroidism, unspecified type  E03.9   9. Hypertension associated with diabetes ([facility] E11.59    I10   10. Hyperlipidemia associated with type 2 diabetes mellitus (HCC)  E11.69    E78.5   11. Other migraine without status migrainosus, not intractable  G43.809     No orders of the  defined types were placed in this encounter.   Return precautions advised.  [name], MD

## 2019-02-10 LAB — URINE CULTURE
MICRO NUMBER:: 1212895
SPECIMEN QUALITY:: ADEQUATE

## 2019-02-11 LAB — CYTOLOGY - PAP
Adequacy: ABSENT
Chlamydia: NEGATIVE
Comment: NEGATIVE
Comment: NEGATIVE
Comment: NEGATIVE
Comment: NORMAL
Diagnosis: NEGATIVE
High risk HPV: NEGATIVE
Neisseria Gonorrhea: NEGATIVE
Trichomonas: NEGATIVE

## 2019-02-11 MED ORDER — SULFAMETHOXAZOLE-TRIMETHOPRIM 800-160 MG PO TABS: 1.0000 | ORAL_TABLET | Freq: Two times a day (BID) | ORAL | 0 refills | Status: DC

## 2019-02-11 NOTE — Addendum Note (Signed)
Addended by: Marin Olp on: 02/11/2019 07:47 AM   Modules accepted: Orders

## 2019-02-11 NOTE — Progress Notes (Signed)
Technically a urinary tract infection includes greater than 100,000 colonies of specific bacteria.  You had a lower level in the range of 10,000- 49000 colonies.  Since it is still of a particular bacteria E. coli and you have had lingering symptoms I am going to go ahead and treat.  I have sent in Bactrim for you to take twice a day for 5 days.  Please see [place] back for follow-up visit if symptoms fail to improve.

## 2019-02-12 ENCOUNTER — Other Ambulatory Visit: Payer: Self-pay | Admitting: Family Medicine

## 2019-02-21 ENCOUNTER — Ambulatory Visit: Payer: No Typology Code available for payment source

## 2019-03-11 LAB — HM MAMMOGRAPHY

## 2019-04-04 ENCOUNTER — Encounter: Payer: Self-pay | Admitting: Family Medicine

## 2019-04-15 ENCOUNTER — Other Ambulatory Visit (HOSPITAL_COMMUNITY): Payer: Self-pay | Admitting: Orthopedic Surgery

## 2019-04-15 ENCOUNTER — Other Ambulatory Visit: Payer: Self-pay | Admitting: Orthopedic Surgery

## 2019-04-15 DIAGNOSIS — M542 Cervicalgia: Secondary | ICD-10-CM

## 2019-04-22 ENCOUNTER — Other Ambulatory Visit: Payer: Self-pay

## 2019-04-22 ENCOUNTER — Ambulatory Visit (HOSPITAL_COMMUNITY)
Admission: RE | Admit: 2019-04-22 | Discharge: 2019-04-22 | Disposition: A | Discharge status: Home / Self Care | Payer: Medicare (Managed Care) | Source: Ambulatory Visit | Attending: Orthopedic Surgery | Admitting: Orthopedic Surgery

## 2019-04-22 DIAGNOSIS — M542 Cervicalgia: Secondary | ICD-10-CM | POA: Insufficient documentation

## 2019-04-26 ENCOUNTER — Other Ambulatory Visit: Payer: Self-pay | Admitting: Orthopedic Surgery

## 2019-04-26 ENCOUNTER — Other Ambulatory Visit (HOSPITAL_COMMUNITY): Payer: Self-pay | Admitting: Orthopedic Surgery

## 2019-04-26 DIAGNOSIS — M25562 Pain in left knee: Secondary | ICD-10-CM

## 2019-04-26 DIAGNOSIS — Z96652 Presence of left artificial knee joint: Secondary | ICD-10-CM

## 2019-04-30 ENCOUNTER — Other Ambulatory Visit: Payer: Self-pay | Admitting: Family Medicine

## 2019-05-01 ENCOUNTER — Other Ambulatory Visit: Payer: Self-pay

## 2019-05-01 ENCOUNTER — Ambulatory Visit: Payer: No Typology Code available for payment source | Attending: Orthopedic Surgery

## 2019-05-01 DIAGNOSIS — M542 Cervicalgia: Secondary | ICD-10-CM | POA: Diagnosis present

## 2019-05-01 DIAGNOSIS — R293 Abnormal posture: Secondary | ICD-10-CM | POA: Insufficient documentation

## 2019-05-01 DIAGNOSIS — R252 Cramp and spasm: Secondary | ICD-10-CM | POA: Diagnosis present

## 2019-05-01 NOTE — Therapy (Signed)
[facility] [address] [place], [place], [address] Phone: [phone]   Fax:  [phone]  Physical Therapy Evaluation  Patient Details  Name: [name] [name] MRN: [id number] Date of Birth: 29-Jun-1955 Referring Provider (PT): Arvella Merles, MD   Encounter Date: 05/01/2019  PT End of Session - 05/01/19 0844    Visit Number  1    Date for PT Re-Evaluation  06/26/19    PT Start Time  0801    PT Stop Time  [id number]    PT Time Calculation (min)  43 min    Activity Tolerance  Patient tolerated treatment well    Behavior During Therapy  [facility] for tasks assessed/performed       Past Medical History:  Diagnosis Date  . Acute meniscal tear of knee    Left knee  . Alcohol problem drinking    rehab  . Anemia    menstrual related  . Anxiety   . Arthritis    right ankle-neuropathy  . Colon polyps   . Depression   . DM (diabetes mellitus), type 2 ([place]) 12/2009   type 2  . Evalluate for OSA (obstructive sleep apnea) 08/15/2013   No OSA on sleep study but may be underestimated.-never used cpap    . GERD (gastroesophageal reflux disease)    not currently taking.  Marland Kitchen Heart murmur   . History of recurrent UTIs   . Hyperlipidemia   . Hypertension   . Hypothyroidism   . Liver disease   . Neuromuscular disorder ([place])    neropathy bilateral feet.  . 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 Shortness of breath    with exertion   . Thyroid disease     Past Surgical History:  Procedure Laterality Date  . ADENOIDECTOMY    . BREAST SURGERY Right    lumpectomy-benign  . [place]   1 time  . CHOLECYSTECTOMY    . INNER EAR SURGERY     left ear had tubes put in and removed, scar tissue built up/ loss of hearing in left ear for over a year  . MASS EXCISION Left 12/16/2013   Procedure: EXCISION LEFT  DISTAL THIGH MASS;  Surgeon: Mauri Pole, MD;  Location: [facility];  Service:  Orthopedics;  Laterality: Left;  . right ankle surgery      x 2- no retained hardware  . right rotator cuff surgery      left side  . TONSILLECTOMY    . TOTAL KNEE ARTHROPLASTY  2009   right  . TOTAL KNEE ARTHROPLASTY Left 05/05/2015   Procedure: LEFT TOTAL KNEE ARTHROPLASTY;  Surgeon: Paralee Cancel, MD;  Location: [facility];  Service: Orthopedics;  Laterality: Left;  . TOTAL KNEE REVISION Right 12/16/2013   Procedure: RIGHT TOTAL KNEE REVISION POLY EXCHANGE;  Surgeon: Mauri Pole, MD;  Location: [facility];  Service: Orthopedics;  Laterality: Right;  . TUBAL LIGATION  1987    There were no vitals filed for this visit.   Subjective Assessment - 05/01/19 0804    Subjective  Pt presents to PT with complaints of neck pain of a chronic nature and this has worsened since January with limited A/ROM.  Pt with tension and trigger points in Rt>Lt upper traps.  Pt will get an epidural next week.    Pertinent History  ankle ORIF, bil knee replacements    Diagnostic tests  x-ray and MRI: stenosis  C4-7    Patient Stated Goals  reduce pain, improve cervical A/ROM    Currently in Pain?  Yes    Pain Score  3    up to 10/10 at times   Pain Location  Neck    Pain Orientation  Right;Left    Pain Descriptors / Indicators  Aching;Sharp;Tightness    Pain Type  Chronic pain    Pain Onset  More than a month ago    Pain Frequency  Constant    Aggravating Factors   turning head, driving, activity    Pain Relieving Factors  rest, heat         [facility] PT Assessment - 05/01/19 0001      Assessment   Medical Diagnosis  cervicalgia    Referring Provider (PT)  Arvella Merles, MD    Onset Date/Surgical Date  03/03/19    Hand Dominance  Right      Precautions   Precautions  None      Restrictions   Weight Bearing Restrictions  No      Balance Screen   Has the patient fallen in the past 6 months  Yes    How many times?  1   misstep and fell   Has the patient had a decrease in activity level because of a  fear of falling?   No    Is the patient reluctant to leave their home because of a fear of falling?   No      Home Environment   Living Environment  Private residence    Living Arrangements  Spouse/significant other    Type of [place] to enter    Home Layout  One level      Prior Function   Level of [place]  Retired    Leisure  crafts, baking/cooking      Cognition   Overall Cognitive Status  Within Functional Limits for tasks assessed      Observation/Other Assessments   Focus on Therapeutic Outcomes (FOTO)   50% limitation      Posture/Postural Control   Posture/Postural Control  Postural limitations    Postural Limitations  Rounded Shoulders;Forward head      ROM / Strength   AROM / PROM / Strength  AROM;PROM;Strength      AROM   Overall AROM   Deficits    Overall AROM Comments  UE A/ROM is full    AROM Assessment Site  Cervical    Cervical Flexion  50    Cervical Extension  45    Cervical - Right Side Bend  35    Cervical - Left Side Bend  40    Cervical - Right Rotation  40    Cervical - Left Rotation  55      PROM   Overall PROM   Within functional limits for tasks performed      Strength   Overall Strength  Within functional limits for tasks performed    Overall Strength Comments  4+/5 UE strength      Palpation   Spinal mobility  reduced mobility with pain C2-7    Palpation comment  trigger points Rt>Lt upper traps, bil subboccipitals, and cervical paraspinals      Transfers   Transfers  Stand to Sit;Sit to Stand    Sit to Stand  With upper extremity assist    Stand to Sit  With upper extremity  assist      Ambulation/Gait   Ambulation/Gait  Yes    Gait Pattern  Antalgic                Objective measurements completed on examination: See above findings.              PT Education - 05/01/19 0835    Education Details  Access Code: [email or web address]    Person(s) Educated  Patient     Methods  Explanation;Demonstration;Handout    Comprehension  Verbalized understanding;Returned demonstration       PT Short Term Goals - 05/01/19 0759      PT SHORT TERM GOAL #1   Title  be independent in initial HEP    Time  4    Period  Weeks    Status  New    Target Date  05/29/19      PT SHORT TERM GOAL #2   Title  demonstrate cervical A/ROM rotation to > or = to 60 degrees bil to improve safety with driving    Baseline  --    Time  4    Period  Weeks    Status  New    Target Date  05/29/19      PT SHORT TERM GOAL #3   Title  report a 30% reduction in neck pain with daily activity    Baseline  --    Time  4    Period  Weeks    Status  New    Target Date  05/29/19        PT Long Term Goals - 05/01/19 0759      PT LONG TERM GOAL #1   Title  be independent in advanced HEP    Time  8    Period  Weeks    Status  New    Target Date  06/26/19      PT LONG TERM GOAL #2   Title  reduce FOTO to < or = to 43% limitation    Baseline  --    Time  8    Period  Weeks    Status  New    Target Date  06/26/19      PT LONG TERM GOAL #3   Title  demonstrate cervical A/ROM rotation to > or = to 75 degrees to improve safety with driving    Baseline  -    Time  8    Period  Weeks    Status  New    Target Date  06/26/19      PT LONG TERM GOAL #4   Title  report a 70% reduction in the frequency and intensity of neck pain with daily activity    Time  8    Period  Weeks    Status  New    Target Date  06/26/19      PT LONG TERM GOAL #5   Title  sit with neutral posture and report compliance with corrections > or = to 75% of the time    Time  8    Period  Weeks    Status  New    Target Date  06/26/19             Plan - 05/01/19 [id number]    Clinical Impression Statement  Pt presents to PT with neck pain of a chronic nature with flare-up and limited A/ROM that began 02/2019.  Recent MRI showed cervical stenosis and pt will have  an epidural injection 05/23/19.  Pt  reports 2-10/10 neck pain Rt>Lt depending on level of activity.  Pain is aggravated with turning neck and with driving.  Pt sits with forward head, anterior pelvic tilt and rounded shoulders.  Pt demonstrates limited cervical A/ROM with pain and full P/ROM in supine.  Pt with tension and trigger points in Rt>Lt upper traps, bil cervical paraspinals and suboccpitals.  Pt will benefit from skilled PT to improve postural strength, cervical a/ROM, tissue mobility and reduce pain.    Personal Factors and Comorbidities  Comorbidity 2    Comorbidities  bil knee replacements, ankle ORIF    Examination-Activity Limitations  Carry    Examination-Participation Restrictions  Driving    Stability/Clinical Decision Making  Stable/Uncomplicated    Clinical Decision Making  Low    Rehab Potential  Excellent    PT Frequency  2x / week    PT Duration  8 weeks    PT Treatment/Interventions  ADLs/Self Care Home Management;Cryotherapy;Ultrasound;Traction;Moist Heat;Electrical Stimulation;Functional mobility training;Neuromuscular re-education;Therapeutic activities;Therapeutic exercise;Patient/family education;Manual techniques;Taping;Dry needling;Spinal Manipulations;Joint Manipulations    PT Next Visit Plan  dry needling to neck, upper traps, review HEP, postural strnegth    PT Home Exercise Plan  Access Code: [email or web address]    Consulted and Agree with Plan of Care  Patient       Patient will benefit from skilled therapeutic intervention in order to improve the following deficits and impairments:  Decreased activity tolerance, Decreased strength, Postural dysfunction, Improper body mechanics, Impaired flexibility, Pain, Impaired UE functional use, Increased muscle spasms, Decreased range of motion  Visit Diagnosis: Cervicalgia - Plan: PT plan of care cert/re-cert  Cramp and spasm - Plan: PT plan of care cert/re-cert  Abnormal posture - Plan: PT plan of care cert/re-cert     Problem List Patient Active Problem  List   Diagnosis Date Noted  . Osteoarthritis of ankle, right 01/29/2018  . Disorder of tendon of posterior tibial muscle 07/06/2017  . Sleep related headaches 09/07/2016  . Nightmares REM-sleep type 09/07/2016  . RLS (restless legs syndrome) 09/07/2016  . Snoring 09/07/2016  . Excessive daytime sleepiness 09/07/2016  . Migraine 07/28/2016  . Solitary pulmonary nodule 06/08/2016  . S/P knee replacement 05/05/2015  . Former smoker 07/10/2014  . Shortness of breath 06/07/2014  . Chest pain 06/07/2014  . Osteoarthritis 05/16/2014  . Alcoholic fatty liver [id number]  . Chronic post-traumatic stress disorder (PTSD) 05/02/2014  . Sleep disorder 05/02/2014  . Family history of alcoholism 05/02/2014  . Macrocytosis 04/19/2014  . Fatigue 08/15/2013  . Thyroid nodule 09/17/2012  . Hypothyroid 07/21/2010  . Type II diabetes mellitus, well controlled ([place]) 06/23/2010  . Hypertension associated with diabetes ([place]) 06/23/2010  . Alcohol use disorder, severe, in sustained remission ([place]) 06/23/2010  . Hyperlipidemia associated with type 2 diabetes mellitus ([place]) 06/23/2010  . Morbid obesity ([place]) 06/23/2010  . Chronic depression 06/23/2010  . History of colonic polyps 04/12/2010    Sigurd Sos, PT 05/01/19 9:53 AM  [facility] [address], [place], [place], [address] Phone: [phone]   Fax:  [phone]  Name: [name] [name] MRN: [id number] Date of Birth: 11-09-1955

## 2019-05-01 NOTE — Patient Instructions (Signed)
Access Code: [email or web address] URL: [email or web address] Date: 05/01/2019 Prepared by: Sigurd Sos  Exercises Supine Cervical Rotation AROM on Pillow - 3 reps - 1 sets - 20 hold - 3x daily - 7x weekly Supine Chin Tuck - 10 reps - 1 sets - 5 hold - 3x daily - 7x weekly Seated Cervical Flexion AROM - 3 reps - 1 sets - 20 hold - 3x daily - 7x weekly Seated Cervical Sidebending AROM - 3 reps - 1 sets - 20 hold - 3x daily - 7x weekly Seated Correct Posture - 10 reps - 3 sets - 1x daily - 7x weekly

## 2019-05-01 NOTE — Telephone Encounter (Signed)
LAST APPOINTMENT DATE: 02/08/2019  NEXT APPOINTMENT DATE:08/09/2019   LAST REFILL: 02/08/2019  Rx Wellbutrin XL 300mg   QTY:#90 [id number]

## 2019-05-02 ENCOUNTER — Encounter (HOSPITAL_COMMUNITY)
Admission: RE | Admit: 2019-05-02 | Discharge: 2019-05-02 | Disposition: A | Discharge status: Home / Self Care | Payer: No Typology Code available for payment source | Source: Ambulatory Visit | Attending: Orthopedic Surgery | Admitting: Orthopedic Surgery

## 2019-05-02 ENCOUNTER — Other Ambulatory Visit: Payer: Self-pay

## 2019-05-02 DIAGNOSIS — M25562 Pain in left knee: Secondary | ICD-10-CM | POA: Diagnosis present

## 2019-05-02 DIAGNOSIS — Z96652 Presence of left artificial knee joint: Secondary | ICD-10-CM | POA: Insufficient documentation

## 2019-05-02 MED ORDER — TECHNETIUM TC 99M MEDRONATE IV KIT
20.9000 | PACK | Freq: Once | INTRAVENOUS | Status: AC
  Administered 2019-05-02: 20.9 via INTRAVENOUS

## 2019-05-07 ENCOUNTER — Other Ambulatory Visit: Payer: Self-pay

## 2019-05-07 ENCOUNTER — Ambulatory Visit: Payer: No Typology Code available for payment source

## 2019-05-07 DIAGNOSIS — R252 Cramp and spasm: Secondary | ICD-10-CM

## 2019-05-07 DIAGNOSIS — M542 Cervicalgia: Secondary | ICD-10-CM | POA: Diagnosis not present

## 2019-05-07 DIAGNOSIS — R293 Abnormal posture: Secondary | ICD-10-CM

## 2019-05-07 NOTE — Therapy (Signed)
[facility] [address] [place], [place], [address] Phone: [phone]   Fax:  [phone]  Physical Therapy Treatment  Patient Details  Name: [name] [name] MRN: [id number] Date of Birth: [birth year] Referring Provider (PT): Arvella Merles, MD   Encounter Date: 05/07/2019  PT End of Session - 05/07/19 0938    Visit Number  2    Date for PT Re-Evaluation  06/26/19    Authorization Type  30 visit limit    Authorization - Visit Number  2    Authorization - Number of Visits  30    PT Start Time  0846    PT Stop Time  0929    PT Time Calculation (min)  43 min    Activity Tolerance  Patient tolerated treatment well    Behavior During Therapy  [facility] for tasks assessed/performed       Past Medical History:  Diagnosis Date  . Acute meniscal tear of knee    Left knee  . Alcohol problem drinking    rehab  . Anemia    menstrual related  . Anxiety   . Arthritis    right ankle-neuropathy  . Colon polyps   . Depression   . DM (diabetes mellitus), type 2 ([place]) 12/2009   type 2  . Evalluate for OSA (obstructive sleep apnea) 08/15/2013   No OSA on sleep study but may be underestimated.-never used cpap    . GERD (gastroesophageal reflux disease)    not currently taking.  Marland Kitchen Heart murmur   . History of recurrent UTIs   . Hyperlipidemia   . Hypertension   . Hypothyroidism   . Liver disease   . Neuromuscular disorder ([place])    neropathy bilateral feet.  . 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 Shortness of breath    with exertion   . Thyroid disease     Past Surgical History:  Procedure Laterality Date  . ADENOIDECTOMY    . BREAST SURGERY Right    lumpectomy-benign  . [place]   1 time  . CHOLECYSTECTOMY    . INNER EAR SURGERY     left ear had tubes put in and removed, scar tissue built up/ loss of hearing in left ear for over a year  . MASS EXCISION Left  12/16/2013   Procedure: EXCISION LEFT  DISTAL THIGH MASS;  Surgeon: Mauri Pole, MD;  Location: [facility];  Service: Orthopedics;  Laterality: Left;  . right ankle surgery      x 2- no retained hardware  . right rotator cuff surgery      left side  . TONSILLECTOMY    . TOTAL KNEE ARTHROPLASTY  2009   right  . TOTAL KNEE ARTHROPLASTY Left 05/05/2015   Procedure: LEFT TOTAL KNEE ARTHROPLASTY;  Surgeon: Paralee Cancel, MD;  Location: [facility];  Service: Orthopedics;  Laterality: Left;  . TOTAL KNEE REVISION Right 12/16/2013   Procedure: RIGHT TOTAL KNEE REVISION POLY EXCHANGE;  Surgeon: Mauri Pole, MD;  Location: [facility];  Service: Orthopedics;  Laterality: Right;  . TUBAL LIGATION  1987    There were no vitals filed for this visit.  Subjective Assessment - 05/07/19 0849    Subjective  I have been doing my exercises.  My range of motion in my neck is better.    Currently in Pain?  Yes    Pain Score  1  Pain Location  Neck    Pain Orientation  Left;Right    Pain Descriptors / Indicators  Aching;Sharp;Tightness    Pain Type  Chronic pain    Pain Onset  More than a month ago    Pain Frequency  Constant    Aggravating Factors   turning head, driving, activity    Pain Relieving Factors  rest, heat                       [facility] Adult PT Treatment/Exercise - 05/07/19 0001      Exercises   Exercises  Neck      Manual Therapy   Manual Therapy  Soft tissue mobilization;Myofascial release    Manual therapy comments  bil cervical paraspinals and upper traps, suboccipital release    Myofascial Release  palpation and tissue assessment thorughout dry needling       Neck Exercises: Stretches   Upper Trapezius Stretch  Left;Right;3 reps;20 seconds    Other Neck Stretches  rotation in supine 3x20 seconds    Other Neck Stretches  flexion 3x20 seconds       Trigger Point Dry Needling - 05/07/19 0001    Consent Given?  Yes    Education Handout Provided  Yes    Muscles  Treated Head and Neck  Upper trapezius;Oblique capitus;Suboccipitals;Cervical multifidi    Upper Trapezius Response  Twitch reponse elicited;Palpable increased muscle length    Oblique Capitus Response  Twitch response elicited;Palpable increased muscle length    Suboccipitals Response  Twitch response elicited;Palpable increased muscle length    Cervical multifidi Response  Twitch reponse elicited;Palpable increased muscle length           PT Education - 05/07/19 0852    Education Details  DN info    Person(s) Educated  Patient    Methods  Explanation;Demonstration;Handout    Comprehension  Verbalized understanding;Returned demonstration       PT Short Term Goals - 05/01/19 0759      PT SHORT TERM GOAL #1   Title  be independent in initial HEP    Time  4    Period  Weeks    Status  New    Target Date  05/29/19      PT SHORT TERM GOAL #2   Title  demonstrate cervical A/ROM rotation to > or = to 60 degrees bil to improve safety with driving    Baseline  --    Time  4    Period  Weeks    Status  New    Target Date  05/29/19      PT SHORT TERM GOAL #3   Title  report a 30% reduction in neck pain with daily activity    Baseline  --    Time  4    Period  Weeks    Status  New    Target Date  05/29/19        PT Long Term Goals - 05/01/19 0759      PT LONG TERM GOAL #1   Title  be independent in advanced HEP    Time  8    Period  Weeks    Status  New    Target Date  06/26/19      PT LONG TERM GOAL #2   Title  reduce FOTO to < or = to 43% limitation    Baseline  --    Time  8    Period  Weeks  Status  New    Target Date  06/26/19      PT LONG TERM GOAL #3   Title  demonstrate cervical A/ROM rotation to > or = to 75 degrees to improve safety with driving    Baseline  -    Time  8    Period  Weeks    Status  New    Target Date  06/26/19      PT LONG TERM GOAL #4   Title  report a 70% reduction in the frequency and intensity of neck pain with daily  activity    Time  8    Period  Weeks    Status  New    Target Date  06/26/19      PT LONG TERM GOAL #5   Title  sit with neutral posture and report compliance with corrections > or = to 75% of the time    Time  8    Period  Weeks    Status  New    Target Date  06/26/19            Plan - 05/07/19 0936    Clinical Impression Statement  Pt with first time follow-up after evaluation.  Session focused on review of HEP and dry needling and manual.  Pt with tension and trigger points in bil neck, upper traps and suboccipitals.  Pt demonstrated improved cervical A/ROM in sitting prior to session and reported improved mobility/reduced stiffness post session. Pt will continue to benefit from skilled PT to address cervical ROM, manual to address tissue mobility and pain, and postural strength.    PT Frequency  2x / week    PT Duration  8 weeks    PT Treatment/Interventions  ADLs/Self Care Home Management;Cryotherapy;Ultrasound;Traction;Moist Heat;Electrical Stimulation;Functional mobility training;Neuromuscular re-education;Therapeutic activities;Therapeutic exercise;Patient/family education;Manual techniques;Taping;Dry needling;Spinal Manipulations;Joint Manipulations    PT Next Visit Plan  assess response to dry needling, add postural strength, cervical mobs    PT Home Exercise Plan  Access Code: [email or web address]    Recommended Other Services  initial cert is signed    Consulted and Agree with Plan of Care  Patient       Patient will benefit from skilled therapeutic intervention in order to improve the following deficits and impairments:  Decreased activity tolerance, Decreased strength, Postural dysfunction, Improper body mechanics, Impaired flexibility, Pain, Impaired UE functional use, Increased muscle spasms, Decreased range of motion  Visit Diagnosis: Cramp and spasm  Cervicalgia  Abnormal posture     Problem List Patient Active Problem List   Diagnosis Date Noted  . Osteoarthritis  of ankle, right 01/29/2018  . Disorder of tendon of posterior tibial muscle 07/06/2017  . Sleep related headaches 09/07/2016  . Nightmares REM-sleep type 09/07/2016  . RLS (restless legs syndrome) 09/07/2016  . Snoring 09/07/2016  . Excessive daytime sleepiness 09/07/2016  . Migraine 07/28/2016  . Solitary pulmonary nodule 06/08/2016  . S/P knee replacement 05/05/2015  . Former smoker 07/10/2014  . Shortness of breath 06/07/2014  . Chest pain 06/07/2014  . Osteoarthritis 05/16/2014  . Alcoholic fatty liver [id number]  . Chronic post-traumatic stress disorder (PTSD) 05/02/2014  . Sleep disorder 05/02/2014  . Family history of alcoholism 05/02/2014  . Macrocytosis 04/19/2014  . Fatigue 08/15/2013  . Thyroid nodule 09/17/2012  . Hypothyroid 07/21/2010  . Type II diabetes mellitus, well controlled ([place]) 06/23/2010  . Hypertension associated with diabetes ([place]) 06/23/2010  . Alcohol use disorder, severe, in sustained remission ([place]) 06/23/2010  . Hyperlipidemia associated  with type 2 diabetes mellitus ([place]) 06/23/2010  . Morbid obesity ([place]) 06/23/2010  . Chronic depression 06/23/2010  . History of colonic polyps 04/12/2010     Sigurd Sos, PT 05/07/19 9:40 AM  [facility] [address] [place], [place], [address] Phone: [phone]   Fax:  [phone]  Name: [name] [name] MRN: [id number] Date of Birth: [birth year]

## 2019-05-07 NOTE — Patient Instructions (Signed)

## 2019-05-08 ENCOUNTER — Ambulatory Visit: Payer: No Typology Code available for payment source

## 2019-05-08 ENCOUNTER — Ambulatory Visit: Payer: No Typology Code available for payment source | Admitting: Family Medicine

## 2019-05-09 ENCOUNTER — Ambulatory Visit: Payer: No Typology Code available for payment source

## 2019-05-09 ENCOUNTER — Other Ambulatory Visit: Payer: Self-pay

## 2019-05-09 DIAGNOSIS — R252 Cramp and spasm: Secondary | ICD-10-CM

## 2019-05-09 DIAGNOSIS — R293 Abnormal posture: Secondary | ICD-10-CM

## 2019-05-09 DIAGNOSIS — M542 Cervicalgia: Secondary | ICD-10-CM | POA: Diagnosis not present

## 2019-05-09 NOTE — Patient Instructions (Signed)
Access Code: [email or web address] URL: [email or web address] Date: 05/09/2019 Prepared by: Claiborne Billings  Exercises  Cervical Rotation Prone on Elbows - 2 x daily - 7 x weekly - 1 sets - 10 reps Supine Shoulder Horizontal Abduction with Resistance - 2 x daily - 7 x weekly - 10 reps - 2 sets Supine Bilateral Shoulder External Rotation with Resistance - 2 x daily - 7 x weekly - 10 reps - 2 sets Supine PNF D2 Flexion with Resistance - 2 x daily - 7 x weekly - 10 reps - 2 sets

## 2019-05-09 NOTE — Therapy (Signed)
[facility] [address] [place], [place], [address] Phone: [phone]   Fax:  [phone]  Physical Therapy Treatment  Patient Details  Name: [name] [name] MRN: [id number] Date of Birth: 04/11/1955 Referring Provider (PT): Arvella Merles, MD   Encounter Date: 05/09/2019  PT End of Session - 05/09/19 0931    Visit Number  3    Date for PT Re-Evaluation  06/26/19    Authorization Type  30 visit limit    Authorization - Visit Number  3    Authorization - Number of Visits  30    PT Start Time  [phone]    PT Stop Time  0932    PT Time Calculation (min)  41 min    Activity Tolerance  Patient tolerated treatment well    Behavior During Therapy  [facility] for tasks assessed/performed       Past Medical History:  Diagnosis Date  . Acute meniscal tear of knee    Left knee  . Alcohol problem drinking    rehab  . Anemia    menstrual related  . Anxiety   . Arthritis    right ankle-neuropathy  . Colon polyps   . Depression   . DM (diabetes mellitus), type 2 ([place]) 12/2009   type 2  . Evalluate for OSA (obstructive sleep apnea) 08/15/2013   No OSA on sleep study but may be underestimated.-never used cpap    . GERD (gastroesophageal reflux disease)    not currently taking.  Marland Kitchen Heart murmur   . History of recurrent UTIs   . Hyperlipidemia   . Hypertension   . Hypothyroidism   . Liver disease   . Neuromuscular disorder ([place])    neropathy bilateral feet.  . 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 Shortness of breath    with exertion   . Thyroid disease     Past Surgical History:  Procedure Laterality Date  . ADENOIDECTOMY    . BREAST SURGERY Right    lumpectomy-benign  . [place]   1 time  . CHOLECYSTECTOMY    . INNER EAR SURGERY     left ear had tubes put in and removed, scar tissue built up/ loss of hearing in left ear for over a year  . MASS EXCISION Left  12/16/2013   Procedure: EXCISION LEFT  DISTAL THIGH MASS;  Surgeon: Mauri Pole, MD;  Location: [facility];  Service: Orthopedics;  Laterality: Left;  . right ankle surgery      x 2- no retained hardware  . right rotator cuff surgery      left side  . TONSILLECTOMY    . TOTAL KNEE ARTHROPLASTY  2009   right  . TOTAL KNEE ARTHROPLASTY Left 05/05/2015   Procedure: LEFT TOTAL KNEE ARTHROPLASTY;  Surgeon: Paralee Cancel, MD;  Location: [facility];  Service: Orthopedics;  Laterality: Left;  . TOTAL KNEE REVISION Right 12/16/2013   Procedure: RIGHT TOTAL KNEE REVISION POLY EXCHANGE;  Surgeon: Mauri Pole, MD;  Location: [facility];  Service: Orthopedics;  Laterality: Right;  . TUBAL LIGATION  1987    There were no vitals filed for this visit.  Subjective Assessment - 05/09/19 0852    Subjective  My neck feels looser. I was sore for a couple of days but not too bad.    Currently in Pain?  Yes    Pain Score  2  Pain Location  Neck    Pain Orientation  Right;Left                       [facility] Adult PT Treatment/Exercise - 05/09/19 0001      Exercises   Exercises  Shoulder      Neck Exercises: Machines for Strengthening   UBE (Upper Arm Bike)  Level 1x 6 minutes       Neck Exercises: Prone   Other Prone Exercise  cervical rotaton x 10 each      Shoulder Exercises: Supine   Horizontal ABduction  Strengthening;20 reps;Theraband    Theraband Level (Shoulder Horizontal ABduction)  Level 1 (Yellow)    External Rotation  Strengthening;Both;20 reps    Theraband Level (Shoulder External Rotation)  Level 1 (Yellow)    Diagonals  Strengthening;Both;20 reps    Theraband Level (Shoulder Diagonals)  Level 1 (Yellow)      Manual Therapy   Manual Therapy  Joint mobilization    Joint Mobilization  PA mobs C3-7 grade 2-3             PT Education - 05/09/19 0909    Education Details  Access Code: [email or web address]    Person(s) Educated  Patient    Methods   Explanation;Demonstration;Handout    Comprehension  Returned demonstration;Verbalized understanding       PT Short Term Goals - 05/09/19 0855      PT SHORT TERM GOAL #1   Title  be independent in initial HEP    Status  Achieved        PT Long Term Goals - 05/01/19 0759      PT LONG TERM GOAL #1   Title  be independent in advanced HEP    Time  8    Period  Weeks    Status  New    Target Date  06/26/19      PT LONG TERM GOAL #2   Title  reduce FOTO to < or = to 43% limitation    Baseline  --    Time  8    Period  Weeks    Status  New    Target Date  06/26/19      PT LONG TERM GOAL #3   Title  demonstrate cervical A/ROM rotation to > or = to 75 degrees to improve safety with driving    Baseline  -    Time  8    Period  Weeks    Status  New    Target Date  06/26/19      PT LONG TERM GOAL #4   Title  report a 70% reduction in the frequency and intensity of neck pain with daily activity    Time  8    Period  Weeks    Status  New    Target Date  06/26/19      PT LONG TERM GOAL #5   Title  sit with neutral posture and report compliance with corrections > or = to 75% of the time    Time  8    Period  Weeks    Status  New    Target Date  06/26/19            Plan - 05/09/19 0906    Clinical Impression Statement  Pt reports improved flexibility and reduced pain after dry needling last session.  Pt demonstrated improved cervical A/ROM into rotation.  Pt tolerated advancement of exercises today  and required minor verbal cues for technique.  Pt will continue to benefit from skilled PT to address neck pain, postural strength, flexibility and manual/dry needling to improve mobility.    PT Frequency  2x / week    PT Duration  8 weeks    PT Treatment/Interventions  ADLs/Self Care Home Management;Cryotherapy;Ultrasound;Traction;Moist Heat;Electrical Stimulation;Functional mobility training;Neuromuscular re-education;Therapeutic activities;Therapeutic  exercise;Patient/family education;Manual techniques;Taping;Dry needling;Spinal Manipulations;Joint Manipulations    PT Next Visit Plan  DN to neck and upper traps, review HEP    PT Home Exercise Plan  Access Code: [email or web address]    Consulted and Agree with Plan of Care  Patient       Patient will benefit from skilled therapeutic intervention in order to improve the following deficits and impairments:  Decreased activity tolerance, Decreased strength, Postural dysfunction, Improper body mechanics, Impaired flexibility, Pain, Impaired UE functional use, Increased muscle spasms, Decreased range of motion  Visit Diagnosis: Cervicalgia  Cramp and spasm  Abnormal posture     Problem List Patient Active Problem List   Diagnosis Date Noted  . Osteoarthritis of ankle, right 01/29/2018  . Disorder of tendon of posterior tibial muscle 07/06/2017  . Sleep related headaches 09/07/2016  . Nightmares REM-sleep type 09/07/2016  . RLS (restless legs syndrome) 09/07/2016  . Snoring 09/07/2016  . Excessive daytime sleepiness 09/07/2016  . Migraine 07/28/2016  . Solitary pulmonary nodule 06/08/2016  . S/P knee replacement 05/05/2015  . Former smoker 07/10/2014  . Shortness of breath 06/07/2014  . Chest pain 06/07/2014  . Osteoarthritis 05/16/2014  . Alcoholic fatty liver [id number]  . Chronic post-traumatic stress disorder (PTSD) 05/02/2014  . Sleep disorder 05/02/2014  . Family history of alcoholism 05/02/2014  . Macrocytosis 04/19/2014  . Fatigue 08/15/2013  . Thyroid nodule 09/17/2012  . Hypothyroid 07/21/2010  . Type II diabetes mellitus, well controlled ([place]) 06/23/2010  . Hypertension associated with diabetes ([place]) 06/23/2010  . Alcohol use disorder, severe, in sustained remission ([place]) 06/23/2010  . Hyperlipidemia associated with type 2 diabetes mellitus ([place]) 06/23/2010  . Morbid obesity ([place]) 06/23/2010  . Chronic depression 06/23/2010  . History of colonic polyps 04/12/2010      Sigurd Sos, PT 05/09/19 9:33 AM  [facility] [address] [place], [place], [address] Phone: [phone]   Fax:  [phone]  Name: [name] [name] MRN: [id number] Date of Birth: Dec 24, 1955

## 2019-05-16 ENCOUNTER — Other Ambulatory Visit: Payer: Self-pay

## 2019-05-16 ENCOUNTER — Ambulatory Visit: Payer: No Typology Code available for payment source

## 2019-05-16 ENCOUNTER — Encounter: Payer: Self-pay | Admitting: Family Medicine

## 2019-05-16 DIAGNOSIS — R293 Abnormal posture: Secondary | ICD-10-CM

## 2019-05-16 DIAGNOSIS — M542 Cervicalgia: Secondary | ICD-10-CM | POA: Diagnosis not present

## 2019-05-16 DIAGNOSIS — R252 Cramp and spasm: Secondary | ICD-10-CM

## 2019-05-16 NOTE — Therapy (Signed)
[facility] [address] [place], [place], [address] Phone: [phone]   Fax:  [phone]  Physical Therapy Treatment  Patient Details  Name: [name] [name] MRN: [id number] Date of Birth: 1955-11-28 Referring Provider (PT): Arvella Merles, MD   Encounter Date: 05/16/2019  PT End of Session - 05/16/19 1227    Visit Number  4    Date for PT Re-Evaluation  06/26/19    Authorization Type  30 visit limit    Authorization - Visit Number  4    Authorization - Number of Visits  30    PT Start Time  [id number]    PT Stop Time  1229    PT Time Calculation (min)  40 min    Activity Tolerance  Patient tolerated treatment well    Behavior During Therapy  [facility] for tasks assessed/performed       Past Medical History:  Diagnosis Date  . Acute meniscal tear of knee    Left knee  . Alcohol problem drinking    rehab  . Anemia    menstrual related  . Anxiety   . Arthritis    right ankle-neuropathy  . Colon polyps   . Depression   . DM (diabetes mellitus), type 2 ([place]) 12/2009   type 2  . Evalluate for OSA (obstructive sleep apnea) 08/15/2013   No OSA on sleep study but may be underestimated.-never used cpap    . GERD (gastroesophageal reflux disease)    not currently taking.  Marland Kitchen Heart murmur   . History of recurrent UTIs   . Hyperlipidemia   . Hypertension   . Hypothyroidism   . Liver disease   . Neuromuscular disorder ([place])    neropathy bilateral feet.  . 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 Shortness of breath    with exertion   . Thyroid disease     Past Surgical History:  Procedure Laterality Date  . ADENOIDECTOMY    . BREAST SURGERY Right    lumpectomy-benign  . [place]   1 time  . CHOLECYSTECTOMY    . INNER EAR SURGERY     left ear had tubes put in and removed, scar tissue built up/ loss of hearing in left ear for over a year  . MASS EXCISION Left  12/16/2013   Procedure: EXCISION LEFT  DISTAL THIGH MASS;  Surgeon: Mauri Pole, MD;  Location: [facility];  Service: Orthopedics;  Laterality: Left;  . right ankle surgery      x 2- no retained hardware  . right rotator cuff surgery      left side  . TONSILLECTOMY    . TOTAL KNEE ARTHROPLASTY  2009   right  . TOTAL KNEE ARTHROPLASTY Left 05/05/2015   Procedure: LEFT TOTAL KNEE ARTHROPLASTY;  Surgeon: Paralee Cancel, MD;  Location: [facility];  Service: Orthopedics;  Laterality: Left;  . TOTAL KNEE REVISION Right 12/16/2013   Procedure: RIGHT TOTAL KNEE REVISION POLY EXCHANGE;  Surgeon: Mauri Pole, MD;  Location: [facility];  Service: Orthopedics;  Laterality: Right;  . TUBAL LIGATION  1987    There were no vitals filed for this visit.  Subjective Assessment - 05/16/19 1152    Subjective  I am sore in my neck today.    Currently in Pain?  Yes    Pain Score  2     Pain Location  Neck    Pain Orientation  Right;Left    Pain Descriptors / Indicators  Aching;Sharp    Pain Type  Chronic pain                       [facility] Adult PT Treatment/Exercise - 05/16/19 0001      Neck Exercises: Machines for Strengthening   UBE (Upper Arm Bike)  Level 1x 6 minutes       Shoulder Exercises: Supine   Horizontal ABduction  Strengthening;20 reps;Theraband    Theraband Level (Shoulder Horizontal ABduction)  Level 1 (Yellow)    External Rotation  Strengthening;Both;20 reps    Theraband Level (Shoulder External Rotation)  Level 1 (Yellow)    Diagonals  Strengthening;Both;20 reps    Theraband Level (Shoulder Diagonals)  Level 1 (Yellow)      Manual Therapy   Manual Therapy  Joint mobilization    Manual therapy comments  bil cervical paraspinals and upper traps, suboccipital release       Trigger Point Dry Needling - 05/16/19 0001    Consent Given?  Yes    Education Handout Provided  Previously provided    Muscles Treated Head and Neck  Upper trapezius;Oblique  capitus;Suboccipitals;Cervical multifidi    Upper Trapezius Response  Twitch reponse elicited;Palpable increased muscle length    Oblique Capitus Response  Twitch response elicited;Palpable increased muscle length    Suboccipitals Response  Twitch response elicited;Palpable increased muscle length    Cervical multifidi Response  Twitch reponse elicited;Palpable increased muscle length             PT Short Term Goals - 05/16/19 1152      PT SHORT TERM GOAL #3   Title  report a 30% reduction in neck pain with daily activity    Baseline  40%    Status  Achieved        PT Long Term Goals - 05/01/19 0759      PT LONG TERM GOAL #1   Title  be independent in advanced HEP    Time  8    Period  Weeks    Status  New    Target Date  06/26/19      PT LONG TERM GOAL #2   Title  reduce FOTO to < or = to 43% limitation    Baseline  --    Time  8    Period  Weeks    Status  New    Target Date  06/26/19      PT LONG TERM GOAL #3   Title  demonstrate cervical A/ROM rotation to > or = to 75 degrees to improve safety with driving    Baseline  -    Time  8    Period  Weeks    Status  New    Target Date  06/26/19      PT LONG TERM GOAL #4   Title  report a 70% reduction in the frequency and intensity of neck pain with daily activity    Time  8    Period  Weeks    Status  New    Target Date  06/26/19      PT LONG TERM GOAL #5   Title  sit with neutral posture and report compliance with corrections > or = to 75% of the time    Time  8    Period  Weeks    Status  New    Target Date  06/26/19  Plan - 05/16/19 1202    Clinical Impression Statement  Pt reports 40% overall improvement in cervical symptoms since the start of care.  Pt has not been as consistent with HEP with band due to getting her COVID vaccine.  Pt was able to demonstrate all aspects correctly.  Pt with tension and trigger points in bil neck and upper traps and demonstrated improved tissue  mobility after manual therapy and dry needling today.  Pt will continue to benefit from skilled PT to address postural strength, flexibility and tissue mobility.    PT Frequency  2x / week    PT Duration  8 weeks    PT Treatment/Interventions  ADLs/Self Care Home Management;Cryotherapy;Ultrasound;Traction;Moist Heat;Electrical Stimulation;Functional mobility training;Neuromuscular re-education;Therapeutic activities;Therapeutic exercise;Patient/family education;Manual techniques;Taping;Dry needling;Spinal Manipulations;Joint Manipulations    PT Next Visit Plan  assess response to dry needling, continue flexibility and strength    PT Home Exercise Plan  Access Code: [email or web address]    Consulted and Agree with Plan of Care  Patient       Patient will benefit from skilled therapeutic intervention in order to improve the following deficits and impairments:  Decreased activity tolerance, Decreased strength, Postural dysfunction, Improper body mechanics, Impaired flexibility, Pain, Impaired UE functional use, Increased muscle spasms, Decreased range of motion  Visit Diagnosis: Cervicalgia  Cramp and spasm  Abnormal posture     Problem List Patient Active Problem List   Diagnosis Date Noted  . Osteoarthritis of ankle, right 01/29/2018  . Disorder of tendon of posterior tibial muscle 07/06/2017  . Sleep related headaches 09/07/2016  . Nightmares REM-sleep type 09/07/2016  . RLS (restless legs syndrome) 09/07/2016  . Snoring 09/07/2016  . Excessive daytime sleepiness 09/07/2016  . Migraine 07/28/2016  . Solitary pulmonary nodule 06/08/2016  . S/P knee replacement 05/05/2015  . Former smoker 07/10/2014  . Shortness of breath 06/07/2014  . Chest pain 06/07/2014  . Osteoarthritis 05/16/2014  . Alcoholic fatty liver [id number]  . Chronic post-traumatic stress disorder (PTSD) 05/02/2014  . Sleep disorder 05/02/2014  . Family history of alcoholism 05/02/2014  . Macrocytosis 04/19/2014  .  Fatigue 08/15/2013  . Thyroid nodule 09/17/2012  . Hypothyroid 07/21/2010  . Type II diabetes mellitus, well controlled ([place]) 06/23/2010  . Hypertension associated with diabetes ([place]) 06/23/2010  . Alcohol use disorder, severe, in sustained remission ([place]) 06/23/2010  . Hyperlipidemia associated with type 2 diabetes mellitus ([place]) 06/23/2010  . Morbid obesity ([place]) 06/23/2010  . Chronic depression 06/23/2010  . History of colonic polyps 04/12/2010    Sigurd Sos, PT 05/16/19 12:31 PM  [facility] [address] [place], [place], [address] Phone: [phone]   Fax:  [phone]  Name: Meco Bazer MRN: [id number] Date of Birth: March 16, 1955

## 2019-05-20 ENCOUNTER — Ambulatory Visit: Payer: No Typology Code available for payment source

## 2019-05-20 ENCOUNTER — Other Ambulatory Visit: Payer: Self-pay

## 2019-05-20 DIAGNOSIS — R252 Cramp and spasm: Secondary | ICD-10-CM

## 2019-05-20 DIAGNOSIS — M542 Cervicalgia: Secondary | ICD-10-CM

## 2019-05-20 DIAGNOSIS — R293 Abnormal posture: Secondary | ICD-10-CM

## 2019-05-20 NOTE — Therapy (Signed)
[facility] [address], [place], [place], [address] Phone: [phone]   Fax:  [phone]  Physical Therapy Treatment  Patient Details  Name: [name] [name] MRN: [id number] Date of Birth: [birth year] Referring Provider (PT): Arvella Merles, MD   Encounter Date: 05/20/2019  PT End of Session - 05/20/19 0844    Visit Number  5    Date for PT Re-Evaluation  06/26/19    Authorization Type  30 visit limit    Authorization - Visit Number  5    Authorization - Number of Visits  30    PT Start Time  0801    PT Stop Time  0843    PT Time Calculation (min)  42 min    Activity Tolerance  Patient tolerated treatment well    Behavior During Therapy  [facility] for tasks assessed/performed       Past Medical History:  Diagnosis Date  . Acute meniscal tear of knee    Left knee  . Alcohol problem drinking    rehab  . Anemia    menstrual related  . Anxiety   . Arthritis    right ankle-neuropathy  . Colon polyps   . Depression   . DM (diabetes mellitus), type 2 ([place]) 12/2009   type 2  . Evalluate for OSA (obstructive sleep apnea) 08/15/2013   No OSA on sleep study but may be underestimated.-never used cpap    . GERD (gastroesophageal reflux disease)    not currently taking.  Marland Kitchen Heart murmur   . History of recurrent UTIs   . Hyperlipidemia   . Hypertension   . Hypothyroidism   . Liver disease   . Neuromuscular disorder ([place])    neropathy bilateral feet.  . 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 Shortness of breath    with exertion   . Thyroid disease     Past Surgical History:  Procedure Laterality Date  . ADENOIDECTOMY    . BREAST SURGERY Right    lumpectomy-benign  . [place]   1 time  . CHOLECYSTECTOMY    . INNER EAR SURGERY     left ear had tubes put in and removed, scar tissue built up/ loss of hearing in left ear for over a year  . MASS EXCISION Left  12/16/2013   Procedure: EXCISION LEFT  DISTAL THIGH MASS;  Surgeon: Mauri Pole, MD;  Location: [facility];  Service: Orthopedics;  Laterality: Left;  . right ankle surgery      x 2- no retained hardware  . right rotator cuff surgery      left side  . TONSILLECTOMY    . TOTAL KNEE ARTHROPLASTY  2009   right  . TOTAL KNEE ARTHROPLASTY Left 05/05/2015   Procedure: LEFT TOTAL KNEE ARTHROPLASTY;  Surgeon: Paralee Cancel, MD;  Location: [facility];  Service: Orthopedics;  Laterality: Left;  . TOTAL KNEE REVISION Right 12/16/2013   Procedure: RIGHT TOTAL KNEE REVISION POLY EXCHANGE;  Surgeon: Mauri Pole, MD;  Location: [facility];  Service: Orthopedics;  Laterality: Right;  . TUBAL LIGATION  1987    There were no vitals filed for this visit.  Subjective Assessment - 05/20/19 0804    Subjective  I feel stiff today.    Patient Stated Goals  reduce pain, improve cervical A/ROM    Currently in Pain?  Yes    Pain Score  1  Pain Location  Neck    Pain Orientation  Right;Left    Pain Descriptors / Indicators  Aching    Pain Type  Chronic pain    Pain Onset  More than a month ago    Aggravating Factors   turning head, driving, activity    Pain Relieving Factors  rest, heat         [facility] PT Assessment - 05/20/19 0001      AROM   Cervical - Right Rotation  70    Cervical - Left Rotation  70                   [facility] Adult PT Treatment/Exercise - 05/20/19 0001      Neck Exercises: Machines for Strengthening   UBE (Upper Arm Bike)  Level 1x 6 minutes    PT present to discuss progress     Neck Exercises: Prone   Other Prone Exercise  cervical rotaton x 10 each      Shoulder Exercises: Seated   Horizontal ABduction  Strengthening;Both;20 reps    Theraband Level (Shoulder Horizontal ABduction)  Level 1 (Yellow)    External Rotation  Strengthening;Both;20 reps;Theraband    Theraband Level (Shoulder External Rotation)  Level 1 (Yellow)    Diagonals  Strengthening;Both;20  reps;Theraband    Theraband Level (Shoulder Diagonals)  Level 1 (Yellow)      Manual Therapy   Manual Therapy  Joint mobilization    Manual therapy comments  bil cervical paraspinals and upper traps, suboccipital release    Joint Mobilization  PA mobs C3-7 grade 2-3               PT Short Term Goals - 05/16/19 1152      PT SHORT TERM GOAL #3   Title  report a 30% reduction in neck pain with daily activity    Baseline  40%    Status  Achieved        PT Long Term Goals - 05/20/19 [id number]      PT LONG TERM GOAL #1   Title  be independent in advanced HEP    Time  8    Period  Weeks    Status  On-going      PT LONG TERM GOAL #3   Title  demonstrate cervical A/ROM rotation to > or = to 75 degrees to improve safety with driving    Baseline  70 degrees    Time  8    Period  Weeks    Status  On-going      PT LONG TERM GOAL #4   Title  report a 70% reduction in the frequency and intensity of neck pain with daily activity    Baseline  80%    Status  Achieved      PT LONG TERM GOAL #5   Title  sit with neutral posture and report compliance with corrections > or = to 75% of the time    Baseline  25-50% of the time    Time  8    Period  Weeks    Status  On-going            Plan - 05/20/19 0818    Clinical Impression Statement  Pt reports 80% overall improvement in neck symptoms since the start of care.  Pt has been making postural adjustments at home.  Pt showed pt modifications for current theraband exercises to be performed in sitting/standing to improve functional postural strength.  Pt with improved cervical A/ROM into rotation with 70 degrees bilaterally and increased ease with turning head with driving. Pt will continue to benefit from skilled PT to address cervical mobility and postural strength.    PT Frequency  2x / week    PT Duration  8 weeks    PT Treatment/Interventions  ADLs/Self Care Home Management;Cryotherapy;Ultrasound;Traction;Moist Heat;Electrical  Stimulation;Functional mobility training;Neuromuscular re-education;Therapeutic activities;Therapeutic exercise;Patient/family education;Manual techniques;Taping;Dry needling;Spinal Manipulations;Joint Manipulations    PT Next Visit Plan  dry needling to neck and thoracic, manual, postural strength    PT Home Exercise Plan  Access Code: [email or web address]    Consulted and Agree with Plan of Care  Patient       Patient will benefit from skilled therapeutic intervention in order to improve the following deficits and impairments:  Decreased activity tolerance, Decreased strength, Postural dysfunction, Improper body mechanics, Impaired flexibility, Pain, Impaired UE functional use, Increased muscle spasms, Decreased range of motion  Visit Diagnosis: Cramp and spasm  Cervicalgia  Abnormal posture     Problem List Patient Active Problem List   Diagnosis Date Noted  . Osteoarthritis of ankle, right 01/29/2018  . Disorder of tendon of posterior tibial muscle 07/06/2017  . Sleep related headaches 09/07/2016  . Nightmares REM-sleep type 09/07/2016  . RLS (restless legs syndrome) 09/07/2016  . Snoring 09/07/2016  . Excessive daytime sleepiness 09/07/2016  . Migraine 07/28/2016  . Solitary pulmonary nodule 06/08/2016  . S/P knee replacement 05/05/2015  . Former smoker 07/10/2014  . Shortness of breath 06/07/2014  . Chest pain 06/07/2014  . Osteoarthritis 05/16/2014  . Alcoholic fatty liver [id number]  . Chronic post-traumatic stress disorder (PTSD) 05/02/2014  . Sleep disorder 05/02/2014  . Family history of alcoholism 05/02/2014  . Macrocytosis 04/19/2014  . Fatigue 08/15/2013  . Thyroid nodule 09/17/2012  . Hypothyroid 07/21/2010  . Type II diabetes mellitus, well controlled ([place]) 06/23/2010  . Hypertension associated with diabetes ([place]) 06/23/2010  . Alcohol use disorder, severe, in sustained remission ([place]) 06/23/2010  . Hyperlipidemia associated with type 2 diabetes mellitus ([place])  06/23/2010  . Morbid obesity ([place]) 06/23/2010  . Chronic depression 06/23/2010  . History of colonic polyps 04/12/2010     Sigurd Sos, PT 05/20/19 8:47 AM   [facility] [address] [place], [place], [address] Phone: [phone]   Fax:  [phone]  Name: [name] [name] MRN: [id number] Date of Birth: 06-Jul-1955

## 2019-05-22 ENCOUNTER — Other Ambulatory Visit: Payer: Self-pay

## 2019-05-22 ENCOUNTER — Ambulatory Visit: Payer: No Typology Code available for payment source

## 2019-05-22 DIAGNOSIS — R293 Abnormal posture: Secondary | ICD-10-CM

## 2019-05-22 DIAGNOSIS — R252 Cramp and spasm: Secondary | ICD-10-CM

## 2019-05-22 DIAGNOSIS — M542 Cervicalgia: Secondary | ICD-10-CM | POA: Diagnosis not present

## 2019-05-22 NOTE — Therapy (Signed)
[facility] [address], [place], [place], [address] Phone: [phone]   Fax:  [phone]  Physical Therapy Treatment  Patient Details  Name: [name] [name] MRN: [id number] Date of Birth: 21-Oct-1955 Referring Provider (PT): Arvella Merles, MD   Encounter Date: 05/22/2019  PT End of Session - 05/22/19 0928    Visit Number  6    Date for PT Re-Evaluation  06/26/19    Authorization Type  30 visit limit    Authorization - Visit Number  6    Authorization - Number of Visits  30    PT Start Time  [id number]    PT Stop Time  0929    PT Time Calculation (min)  42 min    Activity Tolerance  Patient tolerated treatment well    Behavior During Therapy  [facility] for tasks assessed/performed       Past Medical History:  Diagnosis Date  . Acute meniscal tear of knee    Left knee  . Alcohol problem drinking    rehab  . Anemia    menstrual related  . Anxiety   . Arthritis    right ankle-neuropathy  . Colon polyps   . Depression   . DM (diabetes mellitus), type 2 ([place]) 12/2009   type 2  . Evalluate for OSA (obstructive sleep apnea) 08/15/2013   No OSA on sleep study but may be underestimated.-never used cpap    . GERD (gastroesophageal reflux disease)    not currently taking.  Marland Kitchen Heart murmur   . History of recurrent UTIs   . Hyperlipidemia   . Hypertension   . Hypothyroidism   . Liver disease   . Neuromuscular disorder ([place])    neropathy bilateral feet.  . 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 Shortness of breath    with exertion   . Thyroid disease     Past Surgical History:  Procedure Laterality Date  . ADENOIDECTOMY    . BREAST SURGERY Right    lumpectomy-benign  . [place]   1 time  . CHOLECYSTECTOMY    . INNER EAR SURGERY     left ear had tubes put in and removed, scar tissue built up/ loss of hearing in left ear for over a year  . MASS EXCISION Left  12/16/2013   Procedure: EXCISION LEFT  DISTAL THIGH MASS;  Surgeon: Mauri Pole, MD;  Location: [facility];  Service: Orthopedics;  Laterality: Left;  . right ankle surgery      x 2- no retained hardware  . right rotator cuff surgery      left side  . TONSILLECTOMY    . TOTAL KNEE ARTHROPLASTY  2009   right  . TOTAL KNEE ARTHROPLASTY Left 05/05/2015   Procedure: LEFT TOTAL KNEE ARTHROPLASTY;  Surgeon: Paralee Cancel, MD;  [facility]: [facility];  Service: Orthopedics;  Laterality: Left;  . TOTAL KNEE REVISION Right 12/16/2013   Procedure: RIGHT TOTAL KNEE REVISION POLY EXCHANGE;  Surgeon: Mauri Pole, MD;  [facility];  Service: Orthopedics;  Laterality: Right;  . TUBAL LIGATION  1987    There were no vitals filed for this visit.  Subjective Assessment - 05/22/19 0852    Subjective  I had a bad day yesterday.  My neck and back were both hurting.  Neck pain was 4-5/10    Currently in Pain?  Yes    Pain Score  1  Pain Location  Neck                       [facility] Adult PT Treatment/Exercise - 05/22/19 0001      Neck Exercises: Machines for Strengthening   UBE (Upper Arm Bike)  Level 1x 6 minutes    PT present to discuss progress     Neck Exercises: Seated   Other Seated Exercise  cervicothoracic rotation 3x20 seconds each      Manual Therapy   Manual Therapy  Soft tissue mobilization;Myofascial release    Manual therapy comments  bil cervical paraspinals and upper traps, suboccipital release      Neck Exercises: Stretches   Upper Trapezius Stretch  Left;Right;3 reps;20 seconds       Trigger Point Dry Needling - 05/22/19 0001    Consent Given?  Yes    Education Handout Provided  Previously provided    Muscles Treated Head and Neck  Upper trapezius;Oblique capitus;Suboccipitals;Cervical multifidi    Upper Trapezius Response  Twitch reponse elicited;Palpable increased muscle length    Oblique Capitus Response  Twitch response elicited;Palpable increased muscle  length    Suboccipitals Response  Twitch response elicited;Palpable increased muscle length    Cervical multifidi Response  Twitch reponse elicited;Palpable increased muscle length             PT Short Term Goals - 05/16/19 1152      PT SHORT TERM GOAL #3   Title  report a 30% reduction in neck pain with daily activity    Baseline  40%    Status  Achieved        PT Long Term Goals - 05/20/19 [id number]      PT LONG TERM GOAL #1   Title  be independent in advanced HEP    Time  8    Period  Weeks    Status  On-going      PT LONG TERM GOAL #3   Title  demonstrate cervical A/ROM rotation to > or = to 75 degrees to improve safety with driving    Baseline  70 degrees    Time  8    Period  Weeks    Status  On-going      PT LONG TERM GOAL #4   Title  report a 70% reduction in the frequency and intensity of neck pain with daily activity    Baseline  80%    Status  Achieved      PT LONG TERM GOAL #5   Title  sit with neutral posture and report compliance with corrections > or = to 75% of the time    Baseline  25-50% of the time    Time  8    Period  Weeks    Status  On-going            Plan - 05/22/19 0855    Clinical Impression Statement  Pt reports 80% overall improvement in neck symptoms since the start of care.  Pt has been making postural adjustments at home. Pt had a flare-up of pain yesterday with unknown reason  Pt with improved cervical A/ROM into rotation with 70 degrees bilaterally and increased ease with turning head with driving. Pt with tension and trigger points in bil neck and upper traps although improved from evaluation.  Pt demonstrated improved tissue mobility after manual therapy and dry needling today.  Pt will continue to benefit from skilled PT to address cervical mobility and postural  strength.    PT Frequency  2x / week    PT Duration  8 weeks    PT Treatment/Interventions  ADLs/Self Care Home Management;Cryotherapy;Ultrasound;Traction;Moist  Heat;Electrical Stimulation;Functional mobility training;Neuromuscular re-education;Therapeutic activities;Therapeutic exercise;Patient/family education;Manual techniques;Taping;Dry needling;Spinal Manipulations;Joint Manipulations    PT Next Visit Plan  assess response to dry needling, review yellow theraband    PT Home Exercise Plan  Access Code: [email or web address]    Consulted and Agree with Plan of Care  Patient       Patient will benefit from skilled therapeutic intervention in order to improve the following deficits and impairments:  Decreased activity tolerance, Decreased strength, Postural dysfunction, Improper body mechanics, Impaired flexibility, Pain, Impaired UE functional use, Increased muscle spasms, Decreased range of motion  Visit Diagnosis: Cramp and spasm  Cervicalgia  Abnormal posture     Problem List Patient Active Problem List   Diagnosis Date Noted  . Osteoarthritis of ankle, right 01/29/2018  . Disorder of tendon of posterior tibial muscle 07/06/2017  . Sleep related headaches 09/07/2016  . Nightmares REM-sleep type 09/07/2016  . RLS (restless legs syndrome) 09/07/2016  . Snoring 09/07/2016  . Excessive daytime sleepiness 09/07/2016  . Migraine 07/28/2016  . Solitary pulmonary nodule 06/08/2016  . S/P knee replacement 05/05/2015  . Former smoker 07/10/2014  . Shortness of breath 06/07/2014  . Chest pain 06/07/2014  . Osteoarthritis 05/16/2014  . Alcoholic fatty liver [id number]  . Chronic post-traumatic stress disorder (PTSD) 05/02/2014  . Sleep disorder 05/02/2014  . Family history of alcoholism 05/02/2014  . Macrocytosis 04/19/2014  . Fatigue 08/15/2013  . Thyroid nodule 09/17/2012  . Hypothyroid 07/21/2010  . Type II diabetes mellitus, well controlled ([place]) 06/23/2010  . Hypertension associated with diabetes ([place]) 06/23/2010  . Alcohol use disorder, severe, in sustained remission ([place]) 06/23/2010  . Hyperlipidemia associated with type 2 diabetes mellitus  ([place]) 06/23/2010  . Morbid obesity ([place]) 06/23/2010  . Chronic depression 06/23/2010  . History of colonic polyps 04/12/2010    Sigurd Sos, PT 05/22/19 9:31 AM  [facility] [address] [place], [place], [address] Phone: [phone]   Fax:  [phone]  Name: Keiyanna Markson MRN: [id number] Date of Birth: 1955-10-09

## 2019-05-28 ENCOUNTER — Other Ambulatory Visit: Payer: Self-pay

## 2019-05-28 ENCOUNTER — Ambulatory Visit: Payer: No Typology Code available for payment source | Attending: Orthopedic Surgery

## 2019-05-28 DIAGNOSIS — R293 Abnormal posture: Secondary | ICD-10-CM | POA: Diagnosis present

## 2019-05-28 DIAGNOSIS — M545 Low back pain: Secondary | ICD-10-CM | POA: Diagnosis present

## 2019-05-28 DIAGNOSIS — G8929 Other chronic pain: Secondary | ICD-10-CM | POA: Diagnosis present

## 2019-05-28 DIAGNOSIS — M542 Cervicalgia: Secondary | ICD-10-CM | POA: Insufficient documentation

## 2019-05-28 DIAGNOSIS — R252 Cramp and spasm: Secondary | ICD-10-CM | POA: Diagnosis present

## 2019-05-28 NOTE — Therapy (Signed)
[facility] [address] [place], [place], [address] Phone: [phone]   Fax:  [phone]  Physical Therapy Treatment  Patient Details  Name: [name] [name] MRN: [id number] Date of Birth: 06-04-1955 Referring Provider (PT): Arvella Merles, MD   Encounter Date: 05/28/2019  PT End of Session - 05/28/19 1304    Visit Number  7    Date for PT Re-Evaluation  06/26/19    Authorization Type  30 visit limit    Authorization - Visit Number  7    Authorization - Number of Visits  30    PT Start Time  1230    PT Stop Time  1301    PT Time Calculation (min)  31 min    Activity Tolerance  Patient tolerated treatment well    Behavior During Therapy  [facility] for tasks assessed/performed       Past Medical History:  Diagnosis Date  . Acute meniscal tear of knee    Left knee  . Alcohol problem drinking    rehab  . Anemia    menstrual related  . Anxiety   . Arthritis    right ankle-neuropathy  . Colon polyps   . Depression   . DM (diabetes mellitus), type 2 ([place]) 12/2009   type 2  . Evalluate for OSA (obstructive sleep apnea) 08/15/2013   No OSA on sleep study but may be underestimated.-never used cpap    . GERD (gastroesophageal reflux disease)    not currently taking.  Marland Kitchen Heart murmur   . History of recurrent UTIs   . Hyperlipidemia   . Hypertension   . Hypothyroidism   . Liver disease   . Neuromuscular disorder ([place])    neropathy bilateral feet.  . 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 Shortness of breath    with exertion   . Thyroid disease     Past Surgical History:  Procedure Laterality Date  . ADENOIDECTOMY    . BREAST SURGERY Right    lumpectomy-benign  . [place]   1 time  . CHOLECYSTECTOMY    . INNER EAR SURGERY     left ear had tubes put in and removed, scar tissue built up/ loss of hearing in left ear for over a year  . MASS EXCISION Left  12/16/2013   Procedure: EXCISION LEFT  DISTAL THIGH MASS;  Surgeon: Mauri Pole, MD;  Location: [facility];  Service: Orthopedics;  Laterality: Left;  . right ankle surgery      x 2- no retained hardware  . right rotator cuff surgery      left side  . TONSILLECTOMY    . TOTAL KNEE ARTHROPLASTY  2009   right  . TOTAL KNEE ARTHROPLASTY Left 05/05/2015   Procedure: LEFT TOTAL KNEE ARTHROPLASTY;  Surgeon: Paralee Cancel, MD;  [facility]: [facility];  Service: Orthopedics;  Laterality: Left;  . TOTAL KNEE REVISION Right 12/16/2013   Procedure: RIGHT TOTAL KNEE REVISION POLY EXCHANGE;  Surgeon: Mauri Pole, MD;  [facility];  Service: Orthopedics;  Laterality: Right;  . TUBAL LIGATION  1987    There were no vitals filed for this visit.  Subjective Assessment - 05/28/19 1234    Subjective  I had cervical epidural on Friday.  It felt better right away.  Sunday, it started tightening up again.    Currently in Pain?  Yes    Pain Score  1  Pain Location  Neck    Pain Orientation  Right;Left    Pain Descriptors / Indicators  Aching    Pain Type  Chronic pain    Pain Onset  More than a month ago    Pain Frequency  Constant    Aggravating Factors   turning head, driving, activity    Pain Relieving Factors  rest, heat                       [facility] Adult PT Treatment/Exercise - 05/28/19 0001      Neck Exercises: Machines for Strengthening   UBE (Upper Arm Bike)  Level 1x 6 minutes    PT present to discuss progress     Shoulder Exercises: Supine   Horizontal ABduction  Strengthening;20 reps;Theraband    Theraband Level (Shoulder Horizontal ABduction)  Level 1 (Yellow)    External Rotation  Strengthening;Both;20 reps    Theraband Level (Shoulder External Rotation)  Level 1 (Yellow)    Diagonals  Strengthening;Both;20 reps    Theraband Level (Shoulder Diagonals)  Level 1 (Yellow)      Shoulder Exercises: Seated   Other Seated Exercises  3 way raises 1# x15 each       Shoulder Exercises: Standing   Extension  Strengthening;Both;20 reps;Theraband    Theraband Level (Shoulder Extension)  Level 3 (Green)    Row  Strengthening;Both;20 reps;Theraband    Theraband Level (Shoulder Row)  Level 3 Nyoka Cowden)               PT Short Term Goals - 05/16/19 1152      PT SHORT TERM GOAL #3   Title  report a 30% reduction in neck pain with daily activity    Baseline  40%    Status  Achieved        PT Long Term Goals - 05/20/19 [id number]      PT LONG TERM GOAL #1   Title  be independent in advanced HEP    Time  8    Period  Weeks    Status  On-going      PT LONG TERM GOAL #3   Title  demonstrate cervical A/ROM rotation to > or = to 75 degrees to improve safety with driving    Baseline  70 degrees    Time  8    Period  Weeks    Status  On-going      PT LONG TERM GOAL #4   Title  report a 70% reduction in the frequency and intensity of neck pain with daily activity    Baseline  80%    Status  Achieved      PT LONG TERM GOAL #5   Title  sit with neutral posture and report compliance with corrections > or = to 75% of the time    Baseline  25-50% of the time    Time  8    Period  Weeks    Status  On-going            Plan - 05/28/19 1246    Clinical Impression Statement  Pt reports 90% overall improvement in symptoms since the start of care.  Pt is performing flexibility and postural strength exercises at home and demonstrated good form with exercises in the clinic today.  Pt requires intermittent tactile and demo cues for scapular alignment.  Pt will continue to benefit from skilled PT to address neck pain.    PT Frequency  2x /  week    PT Duration  8 weeks    PT Treatment/Interventions  ADLs/Self Care Home Management;Cryotherapy;Ultrasound;Traction;Moist Heat;Electrical Stimulation;Functional mobility training;Neuromuscular re-education;Therapeutic activities;Therapeutic exercise;Patient/family education;Manual techniques;Taping;Dry  needling;Spinal Manipulations;Joint Manipulations    PT Next Visit Plan  dry needling to neck and upper traps, postural strength    PT Home Exercise Plan  Access Code: [email or web address]    Consulted and Agree with Plan of Care  Patient       Patient will benefit from skilled therapeutic intervention in order to improve the following deficits and impairments:  Decreased activity tolerance, Decreased strength, Postural dysfunction, Improper body mechanics, Impaired flexibility, Pain, Impaired UE functional use, Increased muscle spasms, Decreased range of motion  Visit Diagnosis: Cervicalgia  Cramp and spasm  Abnormal posture     Problem List Patient Active Problem List   Diagnosis Date Noted  . Osteoarthritis of ankle, right 01/29/2018  . Disorder of tendon of posterior tibial muscle 07/06/2017  . Sleep related headaches 09/07/2016  . Nightmares REM-sleep type 09/07/2016  . RLS (restless legs syndrome) 09/07/2016  . Snoring 09/07/2016  . Excessive daytime sleepiness 09/07/2016  . Migraine 07/28/2016  . Solitary pulmonary nodule 06/08/2016  . S/P knee replacement 05/05/2015  . Former smoker 07/10/2014  . Shortness of breath 06/07/2014  . Chest pain 06/07/2014  . Osteoarthritis 05/16/2014  . Alcoholic fatty liver [id number]  . Chronic post-traumatic stress disorder (PTSD) 05/02/2014  . Sleep disorder 05/02/2014  . Family history of alcoholism 05/02/2014  . Macrocytosis 04/19/2014  . Fatigue 08/15/2013  . Thyroid nodule 09/17/2012  . Hypothyroid 07/21/2010  . Type II diabetes mellitus, well controlled ([place]) 06/23/2010  . Hypertension associated with diabetes ([place]) 06/23/2010  . Alcohol use disorder, severe, in sustained remission ([place]) 06/23/2010  . Hyperlipidemia associated with type 2 diabetes mellitus ([place]) 06/23/2010  . Morbid obesity ([place]) 06/23/2010  . Chronic depression 06/23/2010  . History of colonic polyps 04/12/2010    Sigurd Sos, PT 05/28/19 1:10 PM  [facility] [address], [place], [place], [address] Phone: [phone]   Fax:  [phone]  Name: Leeyah Miers MRN: [id number] Date of Birth: [birth year]

## 2019-05-30 ENCOUNTER — Ambulatory Visit: Payer: No Typology Code available for payment source

## 2019-05-30 ENCOUNTER — Ambulatory Visit (INDEPENDENT_AMBULATORY_CARE_PROVIDER_SITE_OTHER): Payer: No Typology Code available for payment source

## 2019-05-30 ENCOUNTER — Other Ambulatory Visit: Payer: Self-pay

## 2019-05-30 DIAGNOSIS — M542 Cervicalgia: Secondary | ICD-10-CM

## 2019-05-30 DIAGNOSIS — Z23 Encounter for immunization: Secondary | ICD-10-CM

## 2019-05-30 DIAGNOSIS — R293 Abnormal posture: Secondary | ICD-10-CM

## 2019-05-30 DIAGNOSIS — R252 Cramp and spasm: Secondary | ICD-10-CM

## 2019-05-30 NOTE — Progress Notes (Signed)
Per orders of Dr. Yong Channel, injection of Shingrix  given by Francella Solian in right deltoid. Patient tolerated injection well. Reviewed all symptoms and possible side effects. Has been over 2 weeks from covid vaccinations.

## 2019-05-30 NOTE — Therapy (Signed)
[facility] [address] [place], [place], [address] Phone: [phone]   Fax:  [phone]  Physical Therapy Treatment  Patient Details  Name: [name] [name] MRN: [id number] Date of Birth: 26-Oct-1955 Referring Provider (PT): Arvella Merles, MD   Encounter Date: 05/30/2019  PT End of Session - 05/30/19 1313    Visit Number  8    Date for PT Re-Evaluation  06/26/19    Authorization Type  30 visit limit    Authorization - Visit Number  8    Authorization - Number of Visits  30    PT Start Time  1229    PT Stop Time  1311    PT Time Calculation (min)  42 min    Activity Tolerance  Patient tolerated treatment well    Behavior During Therapy  [facility] for tasks assessed/performed       Past Medical History:  Diagnosis Date  . Acute meniscal tear of knee    Left knee  . Alcohol problem drinking    rehab  . Anemia    menstrual related  . Anxiety   . Arthritis    right ankle-neuropathy  . Colon polyps   . Depression   . DM (diabetes mellitus), type 2 ([place]) 12/2009   type 2  . Evalluate for OSA (obstructive sleep apnea) 08/15/2013   No OSA on sleep study but may be underestimated.-never used cpap    . GERD (gastroesophageal reflux disease)    not currently taking.  Marland Kitchen Heart murmur   . History of recurrent UTIs   . Hyperlipidemia   . Hypertension   . Hypothyroidism   . Liver disease   . Neuromuscular disorder ([place])    neropathy bilateral feet.  . 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 Shortness of breath    with exertion   . Thyroid disease     Past Surgical History:  Procedure Laterality Date  . ADENOIDECTOMY    . BREAST SURGERY Right    lumpectomy-benign  . [place]   1 time  . CHOLECYSTECTOMY    . INNER EAR SURGERY     left ear had tubes put in and removed, scar tissue built up/ loss of hearing in left ear for over a year  . MASS EXCISION Left  12/16/2013   Procedure: EXCISION LEFT  DISTAL THIGH MASS;  Surgeon: Mauri Pole, MD;  [facility];  Service: Orthopedics;  Laterality: Left;  . right ankle surgery      x 2- no retained hardware  . right rotator cuff surgery      left side  . TONSILLECTOMY    . TOTAL KNEE ARTHROPLASTY  2009   right  . TOTAL KNEE ARTHROPLASTY Left 05/05/2015   Procedure: LEFT TOTAL KNEE ARTHROPLASTY;  Surgeon: Paralee Cancel, MD;  [facility];  Service: Orthopedics;  Laterality: Left;  . TOTAL KNEE REVISION Right 12/16/2013   Procedure: RIGHT TOTAL KNEE REVISION POLY EXCHANGE;  Surgeon: Mauri Pole, MD;  [facility];  Service: Orthopedics;  Laterality: Right;  . TUBAL LIGATION  1987    There were no vitals filed for this visit.  Subjective Assessment - 05/30/19 1232    Subjective  I am sore today and it hurts a little bit to turn my head.    Currently in Pain?  Yes    Pain Score  2     Pain Location  Neck    Pain Orientation  Right;Left    Pain Descriptors / Indicators  Aching    Pain Type  Chronic pain    Pain Onset  More than a month ago                       [facility] Adult PT Treatment/Exercise - 05/30/19 0001      Neck Exercises: Machines for Strengthening   UBE (Upper Arm Bike)  Level 1x 6 minutes    PT present to discuss progress     Shoulder Exercises: Seated   Other Seated Exercises  3 way raises 1# x15 each      Shoulder Exercises: Standing   Extension  Strengthening;Both;20 reps;Theraband    Theraband Level (Shoulder Extension)  Level 3 (Green)    Row  Strengthening;Both;20 reps;Theraband    Theraband Level (Shoulder Row)  Level 3 (Green)      Manual Therapy   Manual Therapy  Soft tissue mobilization;Myofascial release       Trigger Point Dry Needling - 05/30/19 0001    Consent Given?  Yes    Education Handout Provided  Previously provided    Muscles Treated Head and Neck  Upper trapezius;Oblique capitus;Suboccipitals;Cervical multifidi     Upper Trapezius Response  Twitch reponse elicited;Palpable increased muscle length    Oblique Capitus Response  Twitch response elicited;Palpable increased muscle length    Suboccipitals Response  Twitch response elicited;Palpable increased muscle length    Cervical multifidi Response  Twitch reponse elicited;Palpable increased muscle length             PT Short Term Goals - 05/16/19 1152      PT SHORT TERM GOAL #3   Title  report a 30% reduction in neck pain with daily activity    Baseline  40%    Status  Achieved        PT Long Term Goals - 05/20/19 [id number]      PT LONG TERM GOAL #1   Title  be independent in advanced HEP    Time  8    Period  Weeks    Status  On-going      PT LONG TERM GOAL #3   Title  demonstrate cervical A/ROM rotation to > or = to 75 degrees to improve safety with driving    Baseline  70 degrees    Time  8    Period  Weeks    Status  On-going      PT LONG TERM GOAL #4   Title  report a 70% reduction in the frequency and intensity of neck pain with daily activity    Baseline  80%    Status  Achieved      PT LONG TERM GOAL #5   Title  sit with neutral posture and report compliance with corrections > or = to 75% of the time    Baseline  25-50% of the time    Time  8    Period  Weeks    Status  On-going            Plan - 05/30/19 1234    Clinical Impression Statement  Pt reports 90% overall improvement in neck symptoms since the start of care.  Pt has been making postural adjustments at home.  Pt continues to work on postural strength with HEP.  Pt with tension and trigger points in bil neck and upper traps although improved from evaluation.  Pt demonstrated  improved tissue mobility after manual therapy and dry needling today.  Pt will continue to benefit from skilled PT to address cervical mobility and postural strength.    PT Frequency  2x / week    PT Duration  8 weeks    PT Treatment/Interventions  ADLs/Self Care Home  Management;Cryotherapy;Ultrasound;Traction;Moist Heat;Electrical Stimulation;Functional mobility training;Neuromuscular re-education;Therapeutic activities;Therapeutic exercise;Patient/family education;Manual techniques;Taping;Dry needling;Spinal Manipulations;Joint Manipulations    PT Next Visit Plan  strength, flexibility, postural alignment    PT Home Exercise Plan  Access Code: [email or web address]    Consulted and Agree with Plan of Care  Patient       Patient will benefit from skilled therapeutic intervention in order to improve the following deficits and impairments:  Decreased activity tolerance, Decreased strength, Postural dysfunction, Improper body mechanics, Impaired flexibility, Pain, Impaired UE functional use, Increased muscle spasms, Decreased range of motion  Visit Diagnosis: Cervicalgia  Cramp and spasm  Abnormal posture     Problem List Patient Active Problem List   Diagnosis Date Noted  . Osteoarthritis of ankle, right 01/29/2018  . Disorder of tendon of posterior tibial muscle 07/06/2017  . Sleep related headaches 09/07/2016  . Nightmares REM-sleep type 09/07/2016  . RLS (restless legs syndrome) 09/07/2016  . Snoring 09/07/2016  . Excessive daytime sleepiness 09/07/2016  . Migraine 07/28/2016  . Solitary pulmonary nodule 06/08/2016  . S/P knee replacement 05/05/2015  . Former smoker 07/10/2014  . Shortness of breath 06/07/2014  . Chest pain 06/07/2014  . Osteoarthritis 05/16/2014  . Alcoholic fatty liver [id number]  . Chronic post-traumatic stress disorder (PTSD) 05/02/2014  . Sleep disorder 05/02/2014  . Family history of alcoholism 05/02/2014  . Macrocytosis 04/19/2014  . Fatigue 08/15/2013  . Thyroid nodule 09/17/2012  . Hypothyroid 07/21/2010  . Type II diabetes mellitus, well controlled ([place]) 06/23/2010  . Hypertension associated with diabetes ([place]) 06/23/2010  . Alcohol use disorder, severe, in sustained remission ([place]) 06/23/2010  . Hyperlipidemia  associated with type 2 diabetes mellitus ([place]) 06/23/2010  . Morbid obesity ([place]) 06/23/2010  . Chronic depression 06/23/2010  . History of colonic polyps 04/12/2010    Sigurd Sos, PT 05/30/19 1:15 PM  [facility] [address], [place], [place], [address] Phone: [phone]   Fax:  [phone]  Name: Chereka Meggison MRN: [id number] Date of Birth: December 02, 1955

## 2019-06-03 ENCOUNTER — Ambulatory Visit: Payer: No Typology Code available for payment source

## 2019-06-03 ENCOUNTER — Other Ambulatory Visit: Payer: Self-pay

## 2019-06-03 DIAGNOSIS — M542 Cervicalgia: Secondary | ICD-10-CM

## 2019-06-03 DIAGNOSIS — R293 Abnormal posture: Secondary | ICD-10-CM

## 2019-06-03 DIAGNOSIS — R252 Cramp and spasm: Secondary | ICD-10-CM

## 2019-06-03 NOTE — Therapy (Signed)
[facility] [address], [place], [place], [address] Phone: [phone]   Fax:  [phone]  Physical Therapy Treatment  Patient Details  Name: [name] [name] MRN: [id number] Date of Birth: [birth year] Referring Provider (PT): Arvella Merles, MD   Encounter Date: 06/03/2019  PT End of Session - 06/03/19 1612    Visit Number  9    Date for PT Re-Evaluation  06/26/19    Authorization Type  30 visit limit    Authorization - Visit Number  9    Authorization - Number of Visits  30    PT Start Time  [id number]    PT Stop Time  1610    PT Time Calculation (min)  40 min    Activity Tolerance  Patient tolerated treatment well    Behavior During Therapy  [facility] for tasks assessed/performed       Past Medical History:  Diagnosis Date  . Acute meniscal tear of knee    Left knee  . Alcohol problem drinking    rehab  . Anemia    menstrual related  . Anxiety   . Arthritis    right ankle-neuropathy  . Colon polyps   . Depression   . DM (diabetes mellitus), type 2 ([place]) 12/2009   type 2  . Evalluate for OSA (obstructive sleep apnea) 08/15/2013   No OSA on sleep study but may be underestimated.-never used cpap    . GERD (gastroesophageal reflux disease)    not currently taking.  Marland Kitchen Heart murmur   . History of recurrent UTIs   . Hyperlipidemia   . Hypertension   . Hypothyroidism   . Liver disease   . Neuromuscular disorder ([place])    neropathy bilateral feet.  . 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 Shortness of breath    with exertion   . Thyroid disease     Past Surgical History:  Procedure Laterality Date  . ADENOIDECTOMY    . BREAST SURGERY Right    lumpectomy-benign  . [place]   1 time  . CHOLECYSTECTOMY    . INNER EAR SURGERY     left ear had tubes put in and removed, scar tissue built up/ loss of hearing in left ear for over a year  . MASS EXCISION Left  12/16/2013   Procedure: EXCISION LEFT  DISTAL THIGH MASS;  Surgeon: Mauri Pole, MD;  [facility];  Service: Orthopedics;  Laterality: Left;  . right ankle surgery      x 2- no retained hardware  . right rotator cuff surgery      left side  . TONSILLECTOMY    . TOTAL KNEE ARTHROPLASTY  2009   right  . TOTAL KNEE ARTHROPLASTY Left 05/05/2015   Procedure: LEFT TOTAL KNEE ARTHROPLASTY;  Surgeon: Paralee Cancel, MD;  [facility];  Service: Orthopedics;  Laterality: Left;  . TOTAL KNEE REVISION Right 12/16/2013   Procedure: RIGHT TOTAL KNEE REVISION POLY EXCHANGE;  Surgeon: Mauri Pole, MD;  [facility];  Service: Orthopedics;  Laterality: Right;  . TUBAL LIGATION  1987    There were no vitals filed for this visit.  Subjective Assessment - 06/03/19 1530    Subjective  I had my 2nd shingles shot last week after needling and I was sore from both.    Currently in Pain?  Yes    Pain Score  1  Pain Location  Neck    Pain Orientation  Right    Pain Descriptors / Indicators  Tightness    Pain Type  Chronic pain    Pain Onset  More than a month ago    Pain Frequency  Constant    Aggravating Factors   after needing (sore), turning head, driving, activity    Pain Relieving Factors  rest                       [facility] Adult PT Treatment/Exercise - 06/03/19 0001      Neck Exercises: Machines for Strengthening   UBE (Upper Arm Bike)  Level 1x 6 minutes    PT present to discuss progress     Neck Exercises: Prone   Other Prone Exercise  cervical rotation x 10 each      Shoulder Exercises: Seated   Other Seated Exercises  3 way raises 1# 2x10 each      Shoulder Exercises: Standing   Extension  Strengthening;Both;20 reps;Theraband    Theraband Level (Shoulder Extension)  Level 3 (Green)    Row  Strengthening;Both;20 reps;Theraband    Theraband Level (Shoulder Row)  Level 3 (Green)      Manual Therapy   Manual Therapy  Joint mobilization    Joint  Mobilization  PA mobs C3-7 grade 2-3               PT Short Term Goals - 06/03/19 1533      PT SHORT TERM GOAL #2   Title  demonstrate cervical A/ROM rotation to > or = to 60 degrees bil to improve safety with driving    Status  Achieved      PT SHORT TERM GOAL #3   Title  report a 30% reduction in neck pain with daily activity    Status  Achieved        PT Long Term Goals - 06/03/19 1534      PT LONG TERM GOAL #2   Title  reduce FOTO to < or = to 43% limitation    Time  8    Period  Weeks    Status  On-going            Plan - 06/03/19 1544    Clinical Impression Statement  Pt reports 90% overall improvement in neck symptoms since the start of care.  Pt has been making postural adjustments at home.  Pt continues to work on postural strength with HEP.  Pt had a flare-up over the weekend and wasn't able to be active.   Pt required minor tactile cues for posture and technique with exercises in the clinic today.   Pt will continue to benefit from skilled PT to address cervical mobility and postural strength.    PT Frequency  2x / week    PT Duration  8 weeks    PT Treatment/Interventions  ADLs/Self Care Home Management;Cryotherapy;Ultrasound;Traction;Moist Heat;Electrical Stimulation;Functional mobility training;Neuromuscular re-education;Therapeutic activities;Therapeutic exercise;Patient/family education;Manual techniques;Taping;Dry needling;Spinal Manipulations;Joint Manipulations    PT Next Visit Plan  MD note next- MD visit 06/07/19.  MD will likely add order for LBP    PT Home Exercise Plan  Access Code: [email or web address]    Consulted and Agree with Plan of Care  Patient       Patient will benefit from skilled therapeutic intervention in order to improve the following deficits and impairments:  Decreased activity tolerance, Decreased strength, Postural dysfunction, Improper body mechanics, Impaired flexibility, Pain, Impaired UE  functional use, Increased muscle spasms,  Decreased range of motion  Visit Diagnosis: Cervicalgia  Cramp and spasm  Abnormal posture     Problem List Patient Active Problem List   Diagnosis Date Noted  . Osteoarthritis of ankle, right 01/29/2018  . Disorder of tendon of posterior tibial muscle 07/06/2017  . Sleep related headaches 09/07/2016  . Nightmares REM-sleep type 09/07/2016  . RLS (restless legs syndrome) 09/07/2016  . Snoring 09/07/2016  . Excessive daytime sleepiness 09/07/2016  . Migraine 07/28/2016  . Solitary pulmonary nodule 06/08/2016  . S/P knee replacement 05/05/2015  . Former smoker 07/10/2014  . Shortness of breath 06/07/2014  . Chest pain 06/07/2014  . Osteoarthritis 05/16/2014  . Alcoholic fatty liver [id number]  . Chronic post-traumatic stress disorder (PTSD) 05/02/2014  . Sleep disorder 05/02/2014  . Family history of alcoholism 05/02/2014  . Macrocytosis 04/19/2014  . Fatigue 08/15/2013  . Thyroid nodule 09/17/2012  . Hypothyroid 07/21/2010  . Type II diabetes mellitus, well controlled ([place]) 06/23/2010  . Hypertension associated with diabetes ([place]) 06/23/2010  . Alcohol use disorder, severe, in sustained remission ([place]) 06/23/2010  . Hyperlipidemia associated with type 2 diabetes mellitus ([place]) 06/23/2010  . Morbid obesity ([place]) 06/23/2010  . Chronic depression 06/23/2010  . History of colonic polyps 04/12/2010     Sigurd Sos, PT 06/03/19 4:17 PM  [facility] [address] [place], [place], [address] Phone: [phone]   Fax:  [phone]  Name: Malory Lariviere MRN: [id number] Date of Birth: Aug 22, 1955

## 2019-06-05 ENCOUNTER — Other Ambulatory Visit: Payer: Self-pay

## 2019-06-05 ENCOUNTER — Ambulatory Visit: Payer: No Typology Code available for payment source

## 2019-06-05 DIAGNOSIS — M542 Cervicalgia: Secondary | ICD-10-CM

## 2019-06-05 DIAGNOSIS — R293 Abnormal posture: Secondary | ICD-10-CM

## 2019-06-05 DIAGNOSIS — R252 Cramp and spasm: Secondary | ICD-10-CM

## 2019-06-05 NOTE — Therapy (Signed)
[facility] [address], [place], [place], [address] Phone: [phone]   Fax:  [phone]  Physical Therapy Treatment  Patient Details  Name: [name] [name] MRN: [id number] Date of Birth: Aug 11, 1955 Referring Provider (PT): Arvella Merles, MD   Encounter Date: 06/05/2019  PT End of Session - 06/05/19 1615    Visit Number  10    Date for PT Re-Evaluation  06/26/19    Authorization Type  30 visit limit    Authorization - Visit Number  10    Authorization - Number of Visits  30    PT Start Time  1532    PT Stop Time  [id number]    PT Time Calculation (min)  43 min    Activity Tolerance  Patient tolerated treatment well    Behavior During Therapy  [facility] for tasks assessed/performed       Past Medical History:  Diagnosis Date  . Acute meniscal tear of knee    Left knee  . Alcohol problem drinking    rehab  . Anemia    menstrual related  . Anxiety   . Arthritis    right ankle-neuropathy  . Colon polyps   . Depression   . DM (diabetes mellitus), type 2 ([place]) 12/2009   type 2  . Evalluate for OSA (obstructive sleep apnea) 08/15/2013   No OSA on sleep study but may be underestimated.-never used cpap    . GERD (gastroesophageal reflux disease)    not currently taking.  Marland Kitchen Heart murmur   . History of recurrent UTIs   . Hyperlipidemia   . Hypertension   . Hypothyroidism   . Liver disease   . Neuromuscular disorder ([place])    neropathy bilateral feet.  . 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 Shortness of breath    with exertion   . Thyroid disease     Past Surgical History:  Procedure Laterality Date  . ADENOIDECTOMY    . BREAST SURGERY Right    lumpectomy-benign  . [place]   1 time  . CHOLECYSTECTOMY    . INNER EAR SURGERY     left ear had tubes put in and removed, scar tissue built up/ loss of hearing in left ear for over a year  . MASS EXCISION Left  12/16/2013   Procedure: EXCISION LEFT  DISTAL THIGH MASS;  Surgeon: Mauri Pole, MD;  Location: [facility];  Service: Orthopedics;  Laterality: Left;  . right ankle surgery      x 2- no retained hardware  . right rotator cuff surgery      left side  . TONSILLECTOMY    . TOTAL KNEE ARTHROPLASTY  2009   right  . TOTAL KNEE ARTHROPLASTY Left 05/05/2015   Procedure: LEFT TOTAL KNEE ARTHROPLASTY;  Surgeon: Paralee Cancel, MD;  Location: [facility];  Service: Orthopedics;  Laterality: Left;  . TOTAL KNEE REVISION Right 12/16/2013   Procedure: RIGHT TOTAL KNEE REVISION POLY EXCHANGE;  Surgeon: Mauri Pole, MD;  Location: [facility];  Service: Orthopedics;  Laterality: Right;  . TUBAL LIGATION  1987    There were no vitals filed for this visit.  Subjective Assessment - 06/05/19 1532    Subjective  I see the MD 06/07/19.  I had a massage yesterday and she did cupping.    Currently in Pain?  No/denies    Pain Score  --   max neck pain:  8/10   Pain Location  Neck    Pain Orientation  Right    Pain Descriptors / Indicators  Tightness    Pain Type  Chronic pain    Pain Onset  More than a month ago    Pain Frequency  Constant    Aggravating Factors   being on my feet a lot, turning head, driving, activity    Pain Relieving Factors  rest, heat         [facility] PT Assessment - 06/05/19 0001      Assessment   Medical Diagnosis  cervicalgia    Referring Provider (PT)  Arvella Merles, MD    Onset Date/Surgical Date  03/03/19    Hand Dominance  Right      Observation/Other Assessments   Focus on Therapeutic Outcomes (FOTO)   43% limitation      AROM   Cervical - Right Rotation  70    Cervical - Left Rotation  80                   [facility] Adult PT Treatment/Exercise - 06/05/19 0001      Manual Therapy   Manual Therapy  Soft tissue mobilization;Myofascial release    Manual therapy comments  bil cervical paraspinals and upper traps, suboccipital release    Myofascial Release  palpation  and tissue assessment thorughout dry needling        Trigger Point Dry Needling - 06/05/19 0001    Consent Given?  Yes    Education Handout Provided  Previously provided    Muscles Treated Head and Neck  Upper trapezius;Oblique capitus;Suboccipitals;Cervical multifidi    Upper Trapezius Response  Twitch reponse elicited;Palpable increased muscle length    Oblique Capitus Response  Twitch response elicited;Palpable increased muscle length    Suboccipitals Response  Twitch response elicited;Palpable increased muscle length    Cervical multifidi Response  Twitch reponse elicited;Palpable increased muscle length             PT Short Term Goals - 06/03/19 1533      PT SHORT TERM GOAL #2   Title  demonstrate cervical A/ROM rotation to > or = to 60 degrees bil to improve safety with driving    Status  Achieved      PT SHORT TERM GOAL #3   Title  report a 30% reduction in neck pain with daily activity    Status  Achieved        PT Long Term Goals - 06/05/19 1535      PT LONG TERM GOAL #1   Title  be independent in advanced HEP    Target Date  06/26/19      PT LONG TERM GOAL #2   Title  reduce FOTO to < or = to 43% limitation    Status  Achieved      PT LONG TERM GOAL #3   Title  demonstrate cervical A/ROM rotation to > or = to 75 degrees to improve safety with driving    Baseline  70 Rt, 80 Lt    Time  8    Period  Weeks    Status  On-going    Target Date  06/26/19      PT LONG TERM GOAL #4   Title  report < or = to 5/10 max neck pain with increased activity or flare-up    Baseline  8/10    Time  6    Period  Weeks    Status  Revised    Target Date  07/19/19      PT LONG TERM GOAL #5   Title  sit with neutral posture and report compliance with corrections > or = to 75% of the time    Baseline  75% correction    Status  Achieved      Additional Long Term Goals   Additional Long Term Goals  Yes            Plan - 06/05/19 1611    Clinical Impression  Statement  Pt reports 90% overall improvement in neck symptoms since the start of care.  Pt has been making postural adjustments at home.  Pt continues to work on postural strength with HEP.  Pt has tension in bil cervical musculature that is improved since the start of care.  Pt demonstrates improved cervical A/ROM overall and increased ease with turning neck with driving. FOTO is improved to 37% limitation.   Pt required minor tactile cues for posture and technique with exercises in the clinic today.   Pt will continue to benefit from skilled PT to address cervical mobility and postural strength.    Rehab Potential  Excellent    PT Frequency  1x / week    PT Duration  6 weeks    PT Treatment/Interventions  ADLs/Self Care Home Management;Cryotherapy;Ultrasound;Traction;Moist Heat;Electrical Stimulation;Functional mobility training;Neuromuscular re-education;Therapeutic activities;Therapeutic exercise;Patient/family education;Manual techniques;Taping;Dry needling;Spinal Manipulations;Joint Manipulations    PT Next Visit Plan  ERO next to add LBP if MD orders    PT Home Exercise Plan  Access Code: [email or web address]    Consulted and Agree with Plan of Care  Patient       Patient will benefit from skilled therapeutic intervention in order to improve the following deficits and impairments:  Decreased activity tolerance, Decreased strength, Postural dysfunction, Improper body mechanics, Impaired flexibility, Pain, Impaired UE functional use, Increased muscle spasms, Decreased range of motion  Visit Diagnosis: Cramp and spasm  Cervicalgia  Abnormal posture     Problem List Patient Active Problem List   Diagnosis Date Noted  . Osteoarthritis of ankle, right 01/29/2018  . Disorder of tendon of posterior tibial muscle 07/06/2017  . Sleep related headaches 09/07/2016  . Nightmares REM-sleep type 09/07/2016  . RLS (restless legs syndrome) 09/07/2016  . Snoring 09/07/2016  . Excessive daytime sleepiness  09/07/2016  . Migraine 07/28/2016  . Solitary pulmonary nodule 06/08/2016  . S/P knee replacement 05/05/2015  . Former smoker 07/10/2014  . Shortness of breath 06/07/2014  . Chest pain 06/07/2014  . Osteoarthritis 05/16/2014  . Alcoholic fatty liver [id number]  . Chronic post-traumatic stress disorder (PTSD) 05/02/2014  . Sleep disorder 05/02/2014  . Family history of alcoholism 05/02/2014  . Macrocytosis 04/19/2014  . Fatigue 08/15/2013  . Thyroid nodule 09/17/2012  . Hypothyroid 07/21/2010  . Type II diabetes mellitus, well controlled ([place]) 06/23/2010  . Hypertension associated with diabetes ([place]) 06/23/2010  . Alcohol use disorder, severe, in sustained remission ([place]) 06/23/2010  . Hyperlipidemia associated with type 2 diabetes mellitus ([place]) 06/23/2010  . Morbid obesity ([place]) 06/23/2010  . Chronic depression 06/23/2010  . History of colonic polyps 04/12/2010     Sigurd Sos, PT 06/05/19 4:19 PM  [facility] [address] [place], [place], [address] Phone: [phone]   Fax:  [phone]  Name: Nyemah Errigo MRN: [id number] Date of Birth: January 13, 1956

## 2019-06-12 ENCOUNTER — Ambulatory Visit: Payer: No Typology Code available for payment source

## 2019-06-12 ENCOUNTER — Other Ambulatory Visit: Payer: Self-pay

## 2019-06-12 DIAGNOSIS — M542 Cervicalgia: Secondary | ICD-10-CM

## 2019-06-12 DIAGNOSIS — G8929 Other chronic pain: Secondary | ICD-10-CM

## 2019-06-12 DIAGNOSIS — R293 Abnormal posture: Secondary | ICD-10-CM

## 2019-06-12 DIAGNOSIS — R252 Cramp and spasm: Secondary | ICD-10-CM

## 2019-06-12 NOTE — Patient Instructions (Signed)
Access Code: [email or web address] URL: [email or web address] Date: 06/12/2019 Prepared by: Claiborne Billings  Exercises  Seated Hamstring Stretch - 3 x daily - 7 x weekly - 3 reps - 1 sets - 20 hold Supine Transversus Abdominis Bracing - Hands on Stomach - 1 x daily - 7 x weekly - 10 reps - 3 sets Seated Transversus Abdominis Bracing - 1 x daily - 7 x weekly - 10 reps - 3 sets

## 2019-06-12 NOTE — Therapy (Signed)
[facility] [address], [place], [place], [address] Phone: [phone]   Fax:  [phone]  Physical Therapy Treatment  Patient Details  Name: [name] [name] MRN: [id number] Date of Birth: [birth year] Referring Provider (PT): Arvella Merles, MD   Encounter Date: 06/12/2019  PT End of Session - 06/12/19 1310    Visit Number  11    Date for PT Re-Evaluation  08/07/19    Authorization Type  30 visit limit    Authorization - Visit Number  11    Authorization - Number of Visits  30    PT Start Time  1106    PT Stop Time  1144    PT Time Calculation (min)  38 min    Activity Tolerance  Patient tolerated treatment well    Behavior During Therapy  [facility] for tasks assessed/performed       Past Medical History:  Diagnosis Date  . Acute meniscal tear of knee    Left knee  . Alcohol problem drinking    rehab  . Anemia    menstrual related  . Anxiety   . Arthritis    right ankle-neuropathy  . Colon polyps   . Depression   . DM (diabetes mellitus), type 2 ([place]) 12/2009   type 2  . Evalluate for OSA (obstructive sleep apnea) 08/15/2013   No OSA on sleep study but may be underestimated.-never used cpap    . GERD (gastroesophageal reflux disease)    not currently taking.  Marland Kitchen Heart murmur   . History of recurrent UTIs   . Hyperlipidemia   . Hypertension   . Hypothyroidism   . Liver disease   . Neuromuscular disorder ([place])    neropathy bilateral feet.  . 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 Shortness of breath    with exertion   . Thyroid disease     Past Surgical History:  Procedure Laterality Date  . ADENOIDECTOMY    . BREAST SURGERY Right    lumpectomy-benign  . [place]   1 time  . CHOLECYSTECTOMY    . INNER EAR SURGERY     left ear had tubes put in and removed, scar tissue built up/ loss of hearing in left ear for over a year  . MASS EXCISION Left  12/16/2013   Procedure: EXCISION LEFT  DISTAL THIGH MASS;  Surgeon: Mauri Pole, MD;  Location: [facility];  Service: Orthopedics;  Laterality: Left;  . right ankle surgery      x 2- no retained hardware  . right rotator cuff surgery      left side  . TONSILLECTOMY    . TOTAL KNEE ARTHROPLASTY  2009   right  . TOTAL KNEE ARTHROPLASTY Left 05/05/2015   Procedure: LEFT TOTAL KNEE ARTHROPLASTY;  Surgeon: Paralee Cancel, MD;  Location: [facility];  Service: Orthopedics;  Laterality: Left;  . TOTAL KNEE REVISION Right 12/16/2013   Procedure: RIGHT TOTAL KNEE REVISION POLY EXCHANGE;  Surgeon: Mauri Pole, MD;  Location: [facility];  Service: Orthopedics;  Laterality: Right;  . TUBAL LIGATION  1987    There were no vitals filed for this visit.  Subjective Assessment - 06/12/19 1107    Subjective  Pt has new order for LBP.  Neck is improved by 75% overall.  LBP is chronic.    Pertinent History  ankle ORIF, bil knee replacements    Diagnostic tests  x-ray  and MRI: stenosis C4-7    Patient Stated Goals  reduce pain, improve cervical A/ROM    Currently in Pain?  No/denies    Multiple Pain Sites  Yes    Pain Score  3   up to 10/10 briefly   Pain Location  Back    Pain Orientation  Lower    Pain Descriptors / Indicators  Sharp;Grimacing    Pain Type  Chronic pain    Pain Onset  More than a month ago    Pain Frequency  Constant    Aggravating Factors   walking a lot (pain in evening), sitting too long    Pain Relieving Factors  heat, rest         [facility] PT Assessment - 06/12/19 0001      Assessment   Medical Diagnosis  cervicalgia, low back pain    Referring Provider (PT)  Arvella Merles, MD    Onset Date/Surgical Date  03/03/19   low back: chronic 1980   Next MD Visit  none     Prior Therapy  none for low back      Precautions   Precautions  None      Balance Screen   Has the patient fallen in the past 6 months  No    Has the patient had a decrease in activity level because of a fear of  falling?   No    Is the patient reluctant to leave their home because of a fear of falling?   No      Home Film/video editor residence    Living Arrangements  Spouse/significant other      Prior Function   Level of [place]  Retired    Leisure  crafts, baking/cooking      Cognition   Overall Cognitive Status  Within Functional Limits for tasks assessed      Observation/Other Assessments   Focus on Therapeutic Outcomes (FOTO)   43% limitation   neck   Other Surveys   Oswestry Disability Index    Oswestry Disability Index   32% disability      Posture/Postural Control   Posture/Postural Control  Postural limitations    Postural Limitations  Rounded Shoulders;Forward head;Flexed trunk;Increased lumbar lordosis      ROM / Strength   AROM / PROM / Strength  AROM      AROM   Overall AROM Comments  lumbar A/ROM is full with reduced lumbar segmental mobility into flexion and extension.  Mild pain reported at end range.      Cervical - Right Rotation  70    Cervical - Left Rotation  80      Strength   Overall Strength  Within functional limits for tasks performed      Palpation   Palpation comment  trigger points in Lt>Rt gluteals and lumbar paraspinals      Transfers   Transfers  Stand to Sit;Sit to Stand    Sit to Stand  With upper extremity assist      Ambulation/Gait   Ambulation/Gait  Yes    Gait Pattern  Antalgic                           PT Education - 06/12/19 1124    Education Details  Access Code: [email or web address]    Person(s) Educated  Patient    Methods  Explanation;Demonstration;Handout  Comprehension  Verbalized understanding;Returned demonstration       PT Short Term Goals - 06/03/19 1533      PT SHORT TERM GOAL #2   Title  demonstrate cervical A/ROM rotation to > or = to 60 degrees bil to improve safety with driving    Status  Achieved      PT SHORT TERM GOAL #3   Title  report a  30% reduction in neck pain with daily activity    Status  Achieved        PT Long Term Goals - 06/12/19 1111      PT LONG TERM GOAL #1   Title  be independent in advanced HEP    Time  8    Period  Weeks    Status  On-going    Target Date  08/07/19      PT LONG TERM GOAL #2   Title  improve ODI to < or = to 21%    Time  8    Period  Weeks    Status  New    Target Date  07/24/19      PT LONG TERM GOAL #3   Title  demonstrate cervical A/ROM rotation to > or = to 75 degrees to improve safety with driving    Baseline  70 Rt, 80 Lt    Time  8    Period  Weeks    Status  On-going    Target Date  08/07/19      PT LONG TERM GOAL #4   Title  report < or = to 5/10 max neck pain with increased activity or flare-up    Status  Achieved      PT LONG TERM GOAL #5   Title  verbalize and demonstrate body mechanic modifications with abdominal bracing for lumbar protection with ADLs and self-care    Baseline  --    Time  8    Period  Weeks    Status  New    Target Date  08/07/19      PT LONG TERM GOAL #6   Title  report a 40% reduction in LBP with ADLs and self-care    Time  8    Period  Weeks    Status  New    Target Date  08/07/19            Plan - 06/12/19 1308    Clinical Impression Statement  Pt with chronic LBP and has new order from MD for treatment.  PT reports 75% overall improvement in cervical symptoms and demonstrates improved cervical AROM overall.  Pt is working to make postural corrections at home and reports 75% compliance with this.  Pt reports 3/10 at rest and up to 10/10 at the end of the day, with standing/walking too long and with daily activity.  Pt demonstrates lumbar A/ROM WFLs with reduced lumbar segmental mobility into flexion and extension.  Pt demonstrates antalgic gait and requires UE support with sit to stand due to LBP and knee pain (will have TKA revision on the Lt in June).  Pt with palpable tenderness over bil lumbar paraspinals and Lt> Rt  gluteals with trigger points.  Pt will benefit from skilled PT to address LBP and neck pain to improve functional mobility.    Comorbidities  bil knee replacements, ankle ORIF, chronic LBP    Stability/Clinical Decision Making  Evolving/Moderate complexity    Clinical Decision Making  Moderate    PT Frequency  2x / week    PT Duration  8 weeks    PT Treatment/Interventions  ADLs/Self Care Home Management;Cryotherapy;Ultrasound;Traction;Moist Heat;Electrical Stimulation;Functional mobility training;Neuromuscular re-education;Therapeutic activities;Therapeutic exercise;Patient/family education;Manual techniques;Taping;Dry needling;Spinal Manipulations;Joint Manipulations    PT Next Visit Plan  dry needling to lumbar and gluteals, manual to lumbar spine, core activation and flexibility exercises    PT Home Exercise Plan  Access Code: [email or web address]    Recommended Other Services  recert/lumbar cert sent [id number]    Consulted and Agree with Plan of Care  Patient       Patient will benefit from skilled therapeutic intervention in order to improve the following deficits and impairments:  Decreased activity tolerance, Decreased strength, Postural dysfunction, Improper body mechanics, Impaired flexibility, Pain, Impaired UE functional use, Increased muscle spasms, Decreased range of motion  Visit Diagnosis: Cramp and spasm  Cervicalgia  Abnormal posture  Chronic bilateral low back pain without sciatica     Problem List Patient Active Problem List   Diagnosis Date Noted  . Osteoarthritis of ankle, right 01/29/2018  . Disorder of tendon of posterior tibial muscle 07/06/2017  . Sleep related headaches 09/07/2016  . Nightmares REM-sleep type 09/07/2016  . RLS (restless legs syndrome) 09/07/2016  . Snoring 09/07/2016  . Excessive daytime sleepiness 09/07/2016  . Migraine 07/28/2016  . Solitary pulmonary nodule 06/08/2016  . S/P knee replacement 05/05/2015  . Former smoker 07/10/2014  . Shortness  of breath 06/07/2014  . Chest pain 06/07/2014  . Osteoarthritis 05/16/2014  . Alcoholic fatty liver [id number]  . Chronic post-traumatic stress disorder (PTSD) 05/02/2014  . Sleep disorder 05/02/2014  . Family history of alcoholism 05/02/2014  . Macrocytosis 04/19/2014  . Fatigue 08/15/2013  . Thyroid nodule 09/17/2012  . Hypothyroid 07/21/2010  . Type II diabetes mellitus, well controlled ([place]) 06/23/2010  . Hypertension associated with diabetes ([place]) 06/23/2010  . Alcohol use disorder, severe, in sustained remission ([place]) 06/23/2010  . Hyperlipidemia associated with type 2 diabetes mellitus ([place]) 06/23/2010  . Morbid obesity ([place]) 06/23/2010  . Chronic depression 06/23/2010  . History of colonic polyps 04/12/2010     Sigurd Sos, PT 06/12/19 1:14 PM  [facility] [address] [place], [place], [address] Phone: [phone]   Fax:  [phone]  Name: [name] [name] MRN: [id number] Date of Birth: 1955/11/05

## 2019-06-17 ENCOUNTER — Encounter: Payer: Self-pay | Admitting: Family Medicine

## 2019-06-19 ENCOUNTER — Other Ambulatory Visit: Payer: Self-pay

## 2019-06-19 ENCOUNTER — Ambulatory Visit: Payer: No Typology Code available for payment source

## 2019-06-19 DIAGNOSIS — G8929 Other chronic pain: Secondary | ICD-10-CM

## 2019-06-19 DIAGNOSIS — M542 Cervicalgia: Secondary | ICD-10-CM | POA: Diagnosis not present

## 2019-06-19 DIAGNOSIS — R252 Cramp and spasm: Secondary | ICD-10-CM

## 2019-06-19 DIAGNOSIS — R293 Abnormal posture: Secondary | ICD-10-CM

## 2019-06-19 NOTE — Therapy (Signed)
[facility] [address] [place], [place], [address] Phone: [phone]   Fax:  [phone]  Physical Therapy Treatment  Patient Details  Name: [name] [name] MRN: [id number] Date of Birth: [birth year] Referring Provider (PT): Arvella Merles, MD   Encounter Date: 06/19/2019  PT End of Session - 06/19/19 1613    Visit Number  12    Date for PT Re-Evaluation  08/07/19    Authorization Type  30 visit limit    Authorization - Visit Number  12    Authorization - Number of Visits  30    PT Start Time  1532    PT Stop Time  [id number]    PT Time Calculation (min)  42 min    Activity Tolerance  Patient tolerated treatment well    Behavior During Therapy  [facility] for tasks assessed/performed       Past Medical History:  Diagnosis Date  . Acute meniscal tear of knee    Left knee  . Alcohol problem drinking    rehab  . Anemia    menstrual related  . Anxiety   . Arthritis    right ankle-neuropathy  . Colon polyps   . Depression   . DM (diabetes mellitus), type 2 ([place]) 12/2009   type 2  . Evalluate for OSA (obstructive sleep apnea) 08/15/2013   No OSA on sleep study but may be underestimated.-never used cpap    . GERD (gastroesophageal reflux disease)    not currently taking.  Marland Kitchen Heart murmur   . History of recurrent UTIs   . Hyperlipidemia   . Hypertension   . Hypothyroidism   . Liver disease   . Neuromuscular disorder ([place])    neropathy bilateral feet.  . 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 Shortness of breath    with exertion   . Thyroid disease     Past Surgical History:  Procedure Laterality Date  . ADENOIDECTOMY    . BREAST SURGERY Right    lumpectomy-benign  . [place]   1 time  . CHOLECYSTECTOMY    . INNER EAR SURGERY     left ear had tubes put in and removed, scar tissue built up/ loss of hearing in left ear for over a year  . MASS EXCISION Left  12/16/2013   Procedure: EXCISION LEFT  DISTAL THIGH MASS;  Surgeon: Mauri Pole, MD;  Location: [facility];  Service: Orthopedics;  Laterality: Left;  . right ankle surgery      x 2- no retained hardware  . right rotator cuff surgery      left side  . TONSILLECTOMY    . TOTAL KNEE ARTHROPLASTY  2009   right  . TOTAL KNEE ARTHROPLASTY Left 05/05/2015   Procedure: LEFT TOTAL KNEE ARTHROPLASTY;  Surgeon: Paralee Cancel, MD;  Location: [facility];  Service: Orthopedics;  Laterality: Left;  . TOTAL KNEE REVISION Right 12/16/2013   Procedure: RIGHT TOTAL KNEE REVISION POLY EXCHANGE;  Surgeon: Mauri Pole, MD;  Location: [facility];  Service: Orthopedics;  Laterality: Right;  . TUBAL LIGATION  1987    There were no vitals filed for this visit.  Subjective Assessment - 06/19/19 1538    Subjective  I havent been doing my new exercises for my back as much as I should.    Currently in Pain?  Yes    Pain Score  1     Pain  Location  Neck    Pain Orientation  Right    Pain Score  3    Pain Location  Back    Pain Orientation  Lower    Pain Descriptors / Indicators  Sharp    Pain Onset  More than a month ago    Pain Frequency  Constant    Aggravating Factors   [facility]ing a lot at the [facility], sitting too long    Pain Relieving Factors  heat, rest                       [facility] Adult PT Treatment/Exercise - 06/19/19 0001      Exercises   Exercises  Lumbar;Knee/Hip      Neck Exercises: Machines for Strengthening   Nustep  Level 1x 8 minutes    PT present to discuss progress     Lumbar Exercises: Stretches   Active Hamstring Stretch  3 reps;20 seconds;Left;Right      Manual Therapy   Manual Therapy  Soft tissue mobilization;Myofascial release    Manual therapy comments  bil lumbar paraspinals and gluteals       Trigger Point Dry Needling - 06/19/19 0001    Consent Given?  Yes    Education Handout Provided  Previously provided    Muscles Treated Back/Hip  Gluteus  minimus;Gluteus medius;Lumbar multifidi    Gluteus Minimus Response  Twitch response elicited;Palpable increased muscle length    Gluteus Medius Response  Twitch response elicited;Palpable increased muscle length    Lumbar multifidi Response  Twitch response elicited;Palpable increased muscle length             PT Short Term Goals - 06/03/19 1533      PT SHORT TERM GOAL #2   Title  demonstrate cervical A/ROM rotation to > or = to 60 degrees bil to improve safety with driving    Status  Achieved      PT SHORT TERM GOAL #3   Title  report a 30% reduction in neck pain with daily activity    Status  Achieved        PT Long Term Goals - 06/12/19 1111      PT LONG TERM GOAL #1   Title  be independent in advanced HEP    Time  8    Period  Weeks    Status  On-going    Target Date  08/07/19      PT LONG TERM GOAL #2   Title  improve ODI to < or = to 21%    Time  8    Period  Weeks    Status  New    Target Date  07/24/19      PT LONG TERM GOAL #3   Title  demonstrate cervical A/ROM rotation to > or = to 75 degrees to improve safety with driving    Baseline  70 Rt, 80 Lt    Time  8    Period  Weeks    Status  On-going    Target Date  08/07/19      PT LONG TERM GOAL #4   Title  report < or = to 5/10 max neck pain with increased activity or flare-up    Status  Achieved      PT LONG TERM GOAL #5   Title  verbalize and demonstrate body mechanic modifications with abdominal bracing for lumbar protection with ADLs and self-care    Baseline  --  Time  8    Period  Weeks    Status  New    Target Date  08/07/19      PT LONG TERM GOAL #6   Title  report a 40% reduction in LBP with ADLs and self-care    Time  8    Period  Weeks    Status  New    Target Date  08/07/19            Plan - 06/19/19 1545    Clinical Impression Statement  Pt with first time follow-up after evaluation for lumbar spine.  Pt has not been consistent with HEP for lumbar spine.  Pt had  increased lumbar pain with community ambulation yesterday.  Pt with tension and trigger points in bil lumbar paraspinals and gluteals and session focused on dry needling and manual therapy to address this.  Pt demonstrated improved tissue mobility after manual therapy and dry needling today. Pt will continue to benefit from skilled PT to address lumbar pain, core and hip strength and flexibility.    PT Frequency  2x / week    PT Duration  8 weeks    PT Treatment/Interventions  ADLs/Self Care Home Management;Cryotherapy;Ultrasound;Traction;Moist Heat;Electrical Stimulation;Functional mobility training;Neuromuscular re-education;Therapeutic activities;Therapeutic exercise;Patient/family education;Manual techniques;Taping;Dry needling;Spinal Manipulations;Joint Manipulations    PT Next Visit Plan  assess dry needling to lumbar and gluteals, manual to lumbar spine, core activation and flexibility exercises    PT Home Exercise Plan  Access Code: [email or web address]    Recommended Other Services  recert is signed    Consulted and Agree with Plan of Care  Patient       Patient will benefit from skilled therapeutic intervention in order to improve the following deficits and impairments:  Decreased activity tolerance, Decreased strength, Postural dysfunction, Improper body mechanics, Impaired flexibility, Pain, Impaired UE functional use, Increased muscle spasms, Decreased range of motion  Visit Diagnosis: Cramp and spasm  Cervicalgia  Abnormal posture  Chronic bilateral low back pain without sciatica     Problem List Patient Active Problem List   Diagnosis Date Noted  . Osteoarthritis of ankle, right 01/29/2018  . Disorder of tendon of posterior tibial muscle 07/06/2017  . Sleep related headaches 09/07/2016  . Nightmares REM-sleep type 09/07/2016  . RLS (restless legs syndrome) 09/07/2016  . Snoring 09/07/2016  . Excessive daytime sleepiness 09/07/2016  . Migraine 07/28/2016  . Solitary pulmonary  nodule 06/08/2016  . S/P knee replacement 05/05/2015  . Former smoker 07/10/2014  . Shortness of breath 06/07/2014  . Chest pain 06/07/2014  . Osteoarthritis 05/16/2014  . Alcoholic fatty liver [id number]  . Chronic post-traumatic stress disorder (PTSD) 05/02/2014  . Sleep disorder 05/02/2014  . Family history of alcoholism 05/02/2014  . Macrocytosis 04/19/2014  . Fatigue 08/15/2013  . Thyroid nodule 09/17/2012  . Hypothyroid 07/21/2010  . Type II diabetes mellitus, well controlled ([place]) 06/23/2010  . Hypertension associated with diabetes ([place]) 06/23/2010  . Alcohol use disorder, severe, in sustained remission ([place]) 06/23/2010  . Hyperlipidemia associated with type 2 diabetes mellitus ([place]) 06/23/2010  . Morbid obesity ([place]) 06/23/2010  . Chronic depression 06/23/2010  . History of colonic polyps 04/12/2010    Sigurd Sos, PT 06/19/19 4:20 PM  [facility] [address] [place], [place], [address] Phone: [phone]   Fax:  [phone]  Name: Camree Caba MRN: [id number] Date of Birth: November 19, 1955

## 2019-06-21 ENCOUNTER — Telehealth: Payer: Self-pay

## 2019-06-21 NOTE — Telephone Encounter (Signed)
Received paperwork from [facility] for PreOp evaluation. Per Dr. Yong Channel patient needs to be seen before the paperwork can be filled out.   Unable to get in contact with the patient. LVM asking the patient to return my call. Office number was provided.   If patient calls back please ask her would she like to be seen sooner or keep her appt on 08-09-2019

## 2019-06-25 ENCOUNTER — Other Ambulatory Visit: Payer: Self-pay | Admitting: Family Medicine

## 2019-06-27 ENCOUNTER — Other Ambulatory Visit: Payer: Self-pay | Admitting: Family Medicine

## 2019-07-01 ENCOUNTER — Other Ambulatory Visit: Payer: Self-pay | Admitting: Family Medicine

## 2019-07-04 ENCOUNTER — Other Ambulatory Visit: Payer: Self-pay

## 2019-07-04 ENCOUNTER — Ambulatory Visit: Payer: 59 | Attending: Orthopedic Surgery

## 2019-07-04 DIAGNOSIS — R293 Abnormal posture: Secondary | ICD-10-CM | POA: Diagnosis present

## 2019-07-04 DIAGNOSIS — G8929 Other chronic pain: Secondary | ICD-10-CM | POA: Diagnosis present

## 2019-07-04 DIAGNOSIS — M542 Cervicalgia: Secondary | ICD-10-CM | POA: Diagnosis present

## 2019-07-04 DIAGNOSIS — M545 Low back pain: Secondary | ICD-10-CM | POA: Diagnosis present

## 2019-07-04 DIAGNOSIS — R252 Cramp and spasm: Secondary | ICD-10-CM | POA: Diagnosis not present

## 2019-07-04 NOTE — Therapy (Signed)
[facility] [address] [place], [place], [address] Phone: [phone]   Fax:  [phone]  Physical Therapy Treatment  Patient Details  Name: [name] [name] MRN: [id number] Date of Birth: 1955/04/28 Referring Provider (PT): Arvella Merles, MD   Encounter Date: 07/04/2019  PT End of Session - 07/04/19 1225    Visit Number  13    Date for PT Re-Evaluation  08/07/19    PT Start Time  1147   Dry needling   PT Stop Time  1225    PT Time Calculation (min)  38 min    Activity Tolerance  Patient tolerated treatment well    Behavior During Therapy  [facility] for tasks assessed/performed       Past Medical History:  Diagnosis Date  . Acute meniscal tear of knee    Left knee  . Alcohol problem drinking    rehab  . Anemia    menstrual related  . Anxiety   . Arthritis    right ankle-neuropathy  . Colon polyps   . Depression   . DM (diabetes mellitus), type 2 ([place]) 12/2009   type 2  . Evalluate for OSA (obstructive sleep apnea) 08/15/2013   No OSA on sleep study but may be underestimated.-never used cpap    . GERD (gastroesophageal reflux disease)    not currently taking.  Marland Kitchen Heart murmur   . History of recurrent UTIs   . Hyperlipidemia   . Hypertension   . Hypothyroidism   . Liver disease   . Neuromuscular disorder ([place])    neropathy bilateral feet.  . 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 Shortness of breath    with exertion   . Thyroid disease     Past Surgical History:  Procedure Laterality Date  . ADENOIDECTOMY    . BREAST SURGERY Right    lumpectomy-benign  . [place]   1 time  . CHOLECYSTECTOMY    . INNER EAR SURGERY     left ear had tubes put in and removed, scar tissue built up/ loss of hearing in left ear for over a year  . MASS EXCISION Left 12/16/2013   Procedure: EXCISION LEFT  DISTAL THIGH MASS;  Surgeon: Mauri Pole, MD;  Location: [facility];  Service: Orthopedics;  Laterality: Left;  . right ankle surgery      x 2- no retained hardware  . right rotator cuff surgery      left side  . TONSILLECTOMY    . TOTAL KNEE ARTHROPLASTY  2009   right  . TOTAL KNEE ARTHROPLASTY Left 05/05/2015   Procedure: LEFT TOTAL KNEE ARTHROPLASTY;  Surgeon: Paralee Cancel, MD;  Location: [facility];  Service: Orthopedics;  Laterality: Left;  . TOTAL KNEE REVISION Right 12/16/2013   Procedure: RIGHT TOTAL KNEE REVISION POLY EXCHANGE;  Surgeon: Mauri Pole, MD;  Location: [facility];  Service: Orthopedics;  Laterality: Right;  . TUBAL LIGATION  1987    There were no vitals filed for this visit.  Subjective Assessment - 07/04/19 1153    Subjective  I have been busy.  My back is doing OK.  I would like to concentrate on my neck today.    Currently in Pain?  Yes    Pain Score  1    2/10 neck pain   Pain Location  Neck    Pain Orientation  Right    Pain Descriptors /  Indicators  Tightness    Pain Type  Chronic pain    Pain Onset  More than a month ago    Pain Frequency  Constant    Pain Score  2    Pain Location  Neck    Pain Descriptors / Indicators  Aching;Sharp    Pain Onset  More than a month ago    Pain Frequency  Constant    Aggravating Factors   activity    Pain Relieving Factors  heat, rest                        [facility] Adult PT Treatment/Exercise - 07/04/19 0001      Manual Therapy   Manual Therapy  Soft tissue mobilization;Myofascial release    Manual therapy comments  bil cervical paraspinals and upper traps, suboccipital release       Trigger Point Dry Needling - 07/04/19 0001    Consent Given?  Yes    Education Handout Provided  Previously provided    Muscles Treated Head and Neck  Upper trapezius;Oblique capitus;Suboccipitals;Cervical multifidi    Upper Trapezius Response  Twitch reponse elicited;Palpable increased muscle length    Oblique Capitus Response  Twitch response elicited;Palpable increased muscle  length    Suboccipitals Response  Twitch response elicited;Palpable increased muscle length    Cervical multifidi Response  Twitch reponse elicited;Palpable increased muscle length    Gluteus Minimus Response  --    Gluteus Medius Response  --    Lumbar multifidi Response  --             PT Short Term Goals - 06/03/19 1533      PT SHORT TERM GOAL #2   Title  demonstrate cervical A/ROM rotation to > or = to 60 degrees bil to improve safety with driving    Status  Achieved      PT SHORT TERM GOAL #3   Title  report a 30% reduction in neck pain with daily activity    Status  Achieved        PT Long Term Goals - 06/12/19 1111      PT LONG TERM GOAL #1   Title  be independent in advanced HEP    Time  8    Period  Weeks    Status  On-going    Target Date  08/07/19      PT LONG TERM GOAL #2   Title  improve ODI to < or = to 21%    Time  8    Period  Weeks    Status  New    Target Date  07/24/19      PT LONG TERM GOAL #3   Title  demonstrate cervical A/ROM rotation to > or = to 75 degrees to improve safety with driving    Baseline  70 Rt, 80 Lt    Time  8    Period  Weeks    Status  On-going    Target Date  08/07/19      PT LONG TERM GOAL #4   Title  report < or = to 5/10 max neck pain with increased activity or flare-up    Status  Achieved      PT LONG TERM GOAL #5   Title  verbalize and demonstrate body mechanic modifications with abdominal bracing for lumbar protection with ADLs and self-care    Baseline  --    Time  8  Period  Weeks    Status  New    Target Date  08/07/19      PT LONG TERM GOAL #6   Title  report a 40% reduction in LBP with ADLs and self-care    Time  8    Period  Weeks    Status  New    Target Date  08/07/19            Plan - 07/04/19 1200    Clinical Impression Statement  Pt with a 2 weeks lapse in treatment.  Pt has not been consistent with HEP for lumbar spine.  Pt has been performing cervical exercises.  Pt with  increased cervical pain today and wanted to focus on the neck area.  Pt with tension and trigger points in bil neck and upper traps and demonstrated improved tissue mobility after manual therapy today.  Pt will continue to benefit from skilled PT to address chronic neck and lumbar pain.    Rehab Potential  Excellent    PT Frequency  2x / week    PT Duration  8 weeks    PT Treatment/Interventions  ADLs/Self Care Home Management;Cryotherapy;Ultrasound;Traction;Moist Heat;Electrical Stimulation;Functional mobility training;Neuromuscular re-education;Therapeutic activities;Therapeutic exercise;Patient/family education;Manual techniques;Taping;Dry needling;Spinal Manipulations;Joint Manipulations    PT Next Visit Plan  assess response to dry needling, DN to lumbar again,    PT Home Exercise Plan  Access Code: [email or web address]    Consulted and Agree with Plan of Care  Patient       Patient will benefit from skilled therapeutic intervention in order to improve the following deficits and impairments:  Decreased activity tolerance, Decreased strength, Postural dysfunction, Improper body mechanics, Impaired flexibility, Pain, Impaired UE functional use, Increased muscle spasms, Decreased range of motion  Visit Diagnosis: Cramp and spasm  Cervicalgia  Abnormal posture  Chronic bilateral low back pain without sciatica     Problem List Patient Active Problem List   Diagnosis Date Noted  . Osteoarthritis of ankle, right 01/29/2018  . Disorder of tendon of posterior tibial muscle 07/06/2017  . Sleep related headaches 09/07/2016  . Nightmares REM-sleep type 09/07/2016  . RLS (restless legs syndrome) 09/07/2016  . Snoring 09/07/2016  . Excessive daytime sleepiness 09/07/2016  . Migraine 07/28/2016  . Solitary pulmonary nodule 06/08/2016  . S/P knee replacement 05/05/2015  . Former smoker 07/10/2014  . Shortness of breath 06/07/2014  . Chest pain 06/07/2014  . Osteoarthritis 05/16/2014  . Alcoholic  fatty liver [id number]  . Chronic post-traumatic stress disorder (PTSD) 05/02/2014  . Sleep disorder 05/02/2014  . Family history of alcoholism 05/02/2014  . Macrocytosis 04/19/2014  . Fatigue 08/15/2013  . Thyroid nodule 09/17/2012  . Hypothyroid 07/21/2010  . Type II diabetes mellitus, well controlled ([place]) 06/23/2010  . Hypertension associated with diabetes ([place]) 06/23/2010  . Alcohol use disorder, severe, in sustained remission ([place]) 06/23/2010  . Hyperlipidemia associated with type 2 diabetes mellitus ([place]) 06/23/2010  . Morbid obesity ([place]) 06/23/2010  . Chronic depression 06/23/2010  . History of colonic polyps 04/12/2010     Sigurd Sos, PT 07/04/19 12:27 PM  [facility] [address], [place], [place], [address] Phone: [phone]   Fax:  [phone]  Name: [name] [name] MRN: [id number] Date of Birth: January 02, 1956

## 2019-07-08 ENCOUNTER — Other Ambulatory Visit: Payer: Self-pay

## 2019-07-08 ENCOUNTER — Ambulatory Visit: Payer: 59

## 2019-07-08 DIAGNOSIS — M542 Cervicalgia: Secondary | ICD-10-CM

## 2019-07-08 DIAGNOSIS — G8929 Other chronic pain: Secondary | ICD-10-CM

## 2019-07-08 DIAGNOSIS — R252 Cramp and spasm: Secondary | ICD-10-CM | POA: Diagnosis not present

## 2019-07-08 DIAGNOSIS — R293 Abnormal posture: Secondary | ICD-10-CM

## 2019-07-08 NOTE — Therapy (Signed)
[facility] [address], [place], [place], [address] Phone: [phone]   Fax:  [phone]  Physical Therapy Treatment  Patient Details  Name: [name] [name] MRN: [id number] Date of Birth: 1955-11-19 Referring Provider (PT): Arvella Merles, MD   Encounter Date: 07/08/2019  PT End of Session - 07/08/19 1611    Visit Number  14    Date for PT Re-Evaluation  08/07/19    Authorization Type  30 visit limit    PT Start Time  1532    PT Stop Time  1610    PT Time Calculation (min)  38 min    Activity Tolerance  Patient tolerated treatment well    Behavior During Therapy  [facility] for tasks assessed/performed       Past Medical History:  Diagnosis Date  . Acute meniscal tear of knee    Left knee  . Alcohol problem drinking    rehab  . Anemia    menstrual related  . Anxiety   . Arthritis    right ankle-neuropathy  . Colon polyps   . Depression   . DM (diabetes mellitus), type 2 ([place]) 12/2009   type 2  . Evalluate for OSA (obstructive sleep apnea) 08/15/2013   No OSA on sleep study but may be underestimated.-never used cpap    . GERD (gastroesophageal reflux disease)    not currently taking.  Marland Kitchen Heart murmur   . History of recurrent UTIs   . Hyperlipidemia   . Hypertension   . Hypothyroidism   . Liver disease   . Neuromuscular disorder ([place])    neropathy bilateral feet.  . 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 Shortness of breath    with exertion   . Thyroid disease     Past Surgical History:  Procedure Laterality Date  . ADENOIDECTOMY    . BREAST SURGERY Right    lumpectomy-benign  . [place]   1 time  . CHOLECYSTECTOMY    . INNER EAR SURGERY     left ear had tubes put in and removed, scar tissue built up/ loss of hearing in left ear for over a year  . MASS EXCISION Left 12/16/2013   Procedure: EXCISION LEFT  DISTAL THIGH MASS;  Surgeon: Mauri Pole, MD;  Location: [facility];  Service: Orthopedics;  Laterality: Left;  . right ankle surgery      x 2- no retained hardware  . right rotator cuff surgery      left side  . TONSILLECTOMY    . TOTAL KNEE ARTHROPLASTY  2009   right  . TOTAL KNEE ARTHROPLASTY Left 05/05/2015   Procedure: LEFT TOTAL KNEE ARTHROPLASTY;  Surgeon: Paralee Cancel, MD;  Location: [facility];  Service: Orthopedics;  Laterality: Left;  . TOTAL KNEE REVISION Right 12/16/2013   Procedure: RIGHT TOTAL KNEE REVISION POLY EXCHANGE;  Surgeon: Mauri Pole, MD;  Location: [facility];  Service: Orthopedics;  Laterality: Right;  . TUBAL LIGATION  1987    There were no vitals filed for this visit.  Subjective Assessment - 07/08/19 1535    Subjective  My neck has been sore for a few days now.  Low back is OK since I wasn't on my feet much.    Currently in Pain?  Yes    Pain Score  2     Pain Location  Neck    Pain Orientation  Right  Pain Descriptors / Indicators  Tightness    Pain Type  Chronic pain    Pain Onset  More than a month ago    Pain Frequency  Constant    Aggravating Factors   turning head with driving, activity    Pain Relieving Factors  rest, heat    Pain Score  1    Pain Location  Back    Pain Orientation  Lower    Pain Descriptors / Indicators  Aching;[facility] Adult PT Treatment/Exercise - 07/08/19 0001      Knee/Hip Exercises: Aerobic   Nustep  Level 3x 10 minutes       Manual Therapy   Manual Therapy  Soft tissue mobilization;Myofascial release    Manual therapy comments  bil cervical paraspinals and upper traps, suboccipital release.  passive stretch into sidebending and rotation.    Joint Mobilization  PA mobs C3-7 grade 2-3      Neck Exercises: Stretches   Upper Trapezius Stretch  Left;Right;3 reps;20 seconds    Other Neck Stretches  flexion and rotation 3x20 seconds               PT Short Term Goals - 06/03/19 1533      PT SHORT TERM  GOAL #2   Title  demonstrate cervical A/ROM rotation to > or = to 60 degrees bil to improve safety with driving    Status  Achieved      PT SHORT TERM GOAL #3   Title  report a 30% reduction in neck pain with daily activity    Status  Achieved        PT Long Term Goals - 06/12/19 1111      PT LONG TERM GOAL #1   Title  be independent in advanced HEP    Time  8    Period  Weeks    Status  On-going    Target Date  08/07/19      PT LONG TERM GOAL #2   Title  improve ODI to < or = to 21%    Time  8    Period  Weeks    Status  New    Target Date  07/24/19      PT LONG TERM GOAL #3   Title  demonstrate cervical A/ROM rotation to > or = to 75 degrees to improve safety with driving    Baseline  70 Rt, 80 Lt    Time  8    Period  Weeks    Status  On-going    Target Date  08/07/19      PT LONG TERM GOAL #4   Title  report < or = to 5/10 max neck pain with increased activity or flare-up    Status  Achieved      PT LONG TERM GOAL #5   Title  verbalize and demonstrate body mechanic modifications with abdominal bracing for lumbar protection with ADLs and self-care    Baseline  --    Time  8    Period  Weeks    Status  New    Target Date  08/07/19      PT LONG TERM GOAL #6   Title  report a 40% reduction in LBP with ADLs and self-care    Time  8    Period  Weeks  Status  New    Target Date  08/07/19            Plan - 07/08/19 1539    Clinical Impression Statement  Pt with increased neck pain today and had increased soreness after dry needling last session.  Pt has been compliant in HEP for flexibility and postural strength.  Pt reports less lumbar pain due to not being on her feet as much.  Session focused on mobility, flexibility and manual to address cervical tension. Pt required tactile and verbal cues to reduce scapular elevation with stretching.  Pt will continue to benefit from skilled PT to address lumbar and cervical pain.    PT Frequency  2x / week    PT  Duration  8 weeks    PT Treatment/Interventions  ADLs/Self Care Home Management;Cryotherapy;Ultrasound;Traction;Moist Heat;Electrical Stimulation;Functional mobility training;Neuromuscular re-education;Therapeutic activities;Therapeutic exercise;Patient/family education;Manual techniques;Taping;Dry needling;Spinal Manipulations;Joint Manipulations    PT Next Visit Plan  continue to address neck pain and flexibility, core and postural strength    PT Home Exercise Plan  Access Code: [email or web address]    Consulted and Agree with Plan of Care  Patient       Patient will benefit from skilled therapeutic intervention in order to improve the following deficits and impairments:  Decreased activity tolerance, Decreased strength, Postural dysfunction, Improper body mechanics, Impaired flexibility, Pain, Impaired UE functional use, Increased muscle spasms, Decreased range of motion  Visit Diagnosis: Cramp and spasm  Cervicalgia  Abnormal posture  Chronic bilateral low back pain without sciatica     Problem List Patient Active Problem List   Diagnosis Date Noted  . Osteoarthritis of ankle, right 01/29/2018  . Disorder of tendon of posterior tibial muscle 07/06/2017  . Sleep related headaches 09/07/2016  . Nightmares REM-sleep type 09/07/2016  . RLS (restless legs syndrome) 09/07/2016  . Snoring 09/07/2016  . Excessive daytime sleepiness 09/07/2016  . Migraine 07/28/2016  . Solitary pulmonary nodule 06/08/2016  . S/P knee replacement 05/05/2015  . Former smoker 07/10/2014  . Shortness of breath 06/07/2014  . Chest pain 06/07/2014  . Osteoarthritis 05/16/2014  . Alcoholic fatty liver [id number]  . Chronic post-traumatic stress disorder (PTSD) 05/02/2014  . Sleep disorder 05/02/2014  . Family history of alcoholism 05/02/2014  . Macrocytosis 04/19/2014  . Fatigue 08/15/2013  . Thyroid nodule 09/17/2012  . Hypothyroid 07/21/2010  . Type II diabetes mellitus, well controlled ([place]) 06/23/2010  .  Hypertension associated with diabetes ([place]) 06/23/2010  . Alcohol use disorder, severe, in sustained remission ([place]) 06/23/2010  . Hyperlipidemia associated with type 2 diabetes mellitus ([place]) 06/23/2010  . Morbid obesity ([place]) 06/23/2010  . Chronic depression 06/23/2010  . History of colonic polyps 04/12/2010     Sigurd Sos, PT 07/08/19 4:14 PM  [facility] [address] [place], [place], [address] Phone: [phone]   Fax:  [phone]  Name: [name] [name] MRN: [id number] Date of Birth: [birth year]

## 2019-07-10 ENCOUNTER — Ambulatory Visit: Payer: 59

## 2019-07-10 ENCOUNTER — Other Ambulatory Visit: Payer: Self-pay

## 2019-07-10 DIAGNOSIS — R252 Cramp and spasm: Secondary | ICD-10-CM

## 2019-07-10 DIAGNOSIS — M545 Low back pain, unspecified: Secondary | ICD-10-CM

## 2019-07-10 DIAGNOSIS — R293 Abnormal posture: Secondary | ICD-10-CM

## 2019-07-10 DIAGNOSIS — G8929 Other chronic pain: Secondary | ICD-10-CM

## 2019-07-10 DIAGNOSIS — M542 Cervicalgia: Secondary | ICD-10-CM

## 2019-07-10 NOTE — Therapy (Signed)
[facility] [address] [place], [place], [address] Phone: [phone]   Fax:  [phone]  Physical Therapy Treatment  Patient Details  Name: [name] [name] MRN: [id number] Date of Birth: [birth year] Referring Provider (PT): Arvella Merles, MD   Encounter Date: 07/10/2019  PT End of Session - 07/10/19 1613    Visit Number  15    Date for PT Re-Evaluation  08/07/19    Authorization Type  30 visit limit    Authorization - Visit Number  96    Authorization - Number of Visits  30    PT Start Time  1532    PT Stop Time  [id number]    PT Time Calculation (min)  43 min    Activity Tolerance  Patient tolerated treatment well    Behavior During Therapy  [facility] for tasks assessed/performed       Past Medical History:  Diagnosis Date  . Acute meniscal tear of knee    Left knee  . Alcohol problem drinking    rehab  . Anemia    menstrual related  . Anxiety   . Arthritis    right ankle-neuropathy  . Colon polyps   . Depression   . DM (diabetes mellitus), type 2 ([place]) 12/2009   type 2  . Evalluate for OSA (obstructive sleep apnea) 08/15/2013   No OSA on sleep study but may be underestimated.-never used cpap    . GERD (gastroesophageal reflux disease)    not currently taking.  Marland Kitchen Heart murmur   . History of recurrent UTIs   . Hyperlipidemia   . Hypertension   . Hypothyroidism   . Liver disease   . Neuromuscular disorder ([place])    neropathy bilateral feet.  . 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 Shortness of breath    with exertion   . Thyroid disease     Past Surgical History:  Procedure Laterality Date  . ADENOIDECTOMY    . BREAST SURGERY Right    lumpectomy-benign  . [place]   1 time  . CHOLECYSTECTOMY    . INNER EAR SURGERY     left ear had tubes put in and removed, scar tissue built up/ loss of hearing in left ear for over a year  . MASS EXCISION Left  12/16/2013   Procedure: EXCISION LEFT  DISTAL THIGH MASS;  Surgeon: Mauri Pole, MD;  Location: [facility];  Service: Orthopedics;  Laterality: Left;  . right ankle surgery      x 2- no retained hardware  . right rotator cuff surgery      left side  . TONSILLECTOMY    . TOTAL KNEE ARTHROPLASTY  2009   right  . TOTAL KNEE ARTHROPLASTY Left 05/05/2015   Procedure: LEFT TOTAL KNEE ARTHROPLASTY;  Surgeon: Paralee Cancel, MD;  Location: [facility];  Service: Orthopedics;  Laterality: Left;  . TOTAL KNEE REVISION Right 12/16/2013   Procedure: RIGHT TOTAL KNEE REVISION POLY EXCHANGE;  Surgeon: Mauri Pole, MD;  Location: [facility];  Service: Orthopedics;  Laterality: Right;  . TUBAL LIGATION  1987    There were no vitals filed for this visit.  Subjective Assessment - 07/10/19 1534    Subjective  I was on my feet all day yesterday and my pain is good today.    Currently in Pain?  Yes    Pain Score  1     Pain Location  Neck    Pain Descriptors / Indicators  Tightness    Pain Type  Chronic pain    Pain Onset  More than a month ago    Pain Frequency  Constant                        [facility] Adult PT Treatment/Exercise - 07/10/19 0001      Lumbar Exercises: Stretches   Active Hamstring Stretch  3 reps;20 seconds;Left;Right    Lower Trunk Rotation  3 reps;10 seconds      Lumbar Exercises: Seated   Other Seated Lumbar Exercises  press into foam roll x 10      Lumbar Exercises: Supine   Other Supine Lumbar Exercises  horizontal abduction and D2 with green band 2x10 with core acivation      Lumbar Exercises: Sidelying   Other Sidelying Lumbar Exercises  open book stretch x10      Knee/Hip Exercises: Aerobic   Nustep  Level 3x 10 minutes       Shoulder Exercises: Seated   Other Seated Exercises  3 way raises: 1# 2x10      Neck Exercises: Stretches   Upper Trapezius Stretch  Left;Right;3 reps;20 seconds               PT Short Term Goals - 06/03/19 1533      PT  SHORT TERM GOAL #2   Title  demonstrate cervical A/ROM rotation to > or = to 60 degrees bil to improve safety with driving    Status  Achieved      PT SHORT TERM GOAL #3   Title  report a 30% reduction in neck pain with daily activity    Status  Achieved        PT Long Term Goals - 06/12/19 1111      PT LONG TERM GOAL #1   Title  be independent in advanced HEP    Time  8    Period  Weeks    Status  On-going    Target Date  08/07/19      PT LONG TERM GOAL #2   Title  improve ODI to < or = to 21%    Time  8    Period  Weeks    Status  New    Target Date  07/24/19      PT LONG TERM GOAL #3   Title  demonstrate cervical A/ROM rotation to > or = to 75 degrees to improve safety with driving    Baseline  70 Rt, 80 Lt    Time  8    Period  Weeks    Status  On-going    Target Date  08/07/19      PT LONG TERM GOAL #4   Title  report < or = to 5/10 max neck pain with increased activity or flare-up    Status  Achieved      PT LONG TERM GOAL #5   Title  verbalize and demonstrate body mechanic modifications with abdominal bracing for lumbar protection with ADLs and self-care    Baseline  --    Time  8    Period  Weeks    Status  New    Target Date  08/07/19      PT LONG TERM GOAL #6   Title  report a 40% reduction in LBP with ADLs and self-care    Time  8    Period  Weeks    Status  New    Target Date  08/07/19            Plan - 07/10/19 1558    Clinical Impression Statement  Pt arrived with minimal pain in her neck and lumbar spine. Pt was able to participate in strength and flexibility exercises without increased pain today.  Pt demonstrated good alignment with exercise and required verbal cues for abdominal activation.  Pt will continue to benefit from skilled PT to address chronic neck and lumbar pain.    PT Treatment/Interventions  ADLs/Self Care Home Management;Cryotherapy;Ultrasound;Traction;Moist Heat;Electrical Stimulation;Functional mobility  training;Neuromuscular re-education;Therapeutic activities;Therapeutic exercise;Patient/family education;Manual techniques;Taping;Dry needling;Spinal Manipulations;Joint Manipulations    PT Next Visit Plan  continue to address neck pain and flexibility, core and postural strength    PT Home Exercise Plan  Access Code: [email or web address]    Consulted and Agree with Plan of Care  Patient       Patient will benefit from skilled therapeutic intervention in order to improve the following deficits and impairments:  Decreased activity tolerance, Decreased strength, Postural dysfunction, Improper body mechanics, Impaired flexibility, Pain, Impaired UE functional use, Increased muscle spasms, Decreased range of motion  Visit Diagnosis: Cervicalgia  Cramp and spasm  Abnormal posture  Chronic bilateral low back pain without sciatica     Problem List Patient Active Problem List   Diagnosis Date Noted  . Osteoarthritis of ankle, right 01/29/2018  . Disorder of tendon of posterior tibial muscle 07/06/2017  . Sleep related headaches 09/07/2016  . Nightmares REM-sleep type 09/07/2016  . RLS (restless legs syndrome) 09/07/2016  . Snoring 09/07/2016  . Excessive daytime sleepiness 09/07/2016  . Migraine 07/28/2016  . Solitary pulmonary nodule 06/08/2016  . S/P knee replacement 05/05/2015  . Former smoker 07/10/2014  . Shortness of breath 06/07/2014  . Chest pain 06/07/2014  . Osteoarthritis 05/16/2014  . Alcoholic fatty liver [id number]  . Chronic post-traumatic stress disorder (PTSD) 05/02/2014  . Sleep disorder 05/02/2014  . Family history of alcoholism 05/02/2014  . Macrocytosis 04/19/2014  . Fatigue 08/15/2013  . Thyroid nodule 09/17/2012  . Hypothyroid 07/21/2010  . Type II diabetes mellitus, well controlled ([place]) 06/23/2010  . Hypertension associated with diabetes ([place]) 06/23/2010  . Alcohol use disorder, severe, in sustained remission ([place]) 06/23/2010  . Hyperlipidemia associated with  type 2 diabetes mellitus ([place]) 06/23/2010  . Morbid obesity ([place]) 06/23/2010  . Chronic depression 06/23/2010  . History of colonic polyps 04/12/2010    Sigurd Sos, PT 07/10/19 4:34 PM  [facility] [address], [place], [place], [address] Phone: [phone]   Fax:  [phone]  Name: [name] [name] MRN: [id number] Date of Birth: Oct 07, 1955

## 2019-07-15 ENCOUNTER — Other Ambulatory Visit: Payer: Self-pay

## 2019-07-15 ENCOUNTER — Ambulatory Visit: Payer: 59

## 2019-07-15 DIAGNOSIS — R252 Cramp and spasm: Secondary | ICD-10-CM

## 2019-07-15 DIAGNOSIS — R293 Abnormal posture: Secondary | ICD-10-CM

## 2019-07-15 DIAGNOSIS — M542 Cervicalgia: Secondary | ICD-10-CM

## 2019-07-15 DIAGNOSIS — G8929 Other chronic pain: Secondary | ICD-10-CM

## 2019-07-15 NOTE — Therapy (Signed)
[facility] [address], [place], [place], [address] Phone: [phone]   Fax:  [phone]  Physical Therapy Treatment  Patient Details  Name: [name] [name] MRN: [id number] Date of Birth: [birth year] Referring Provider (PT): Arvella Merles, MD   Encounter Date: 07/15/2019  PT End of Session - 07/15/19 1607    Visit Number  16    Date for PT Re-Evaluation  08/07/19    Authorization Type  30 visit limit    Authorization - Visit Number  14    Authorization - Number of Visits  30    PT Start Time  [id number]    PT Stop Time  1605    PT Time Calculation (min)  44 min    Activity Tolerance  Patient tolerated treatment well    Behavior During Therapy  [facility] for tasks assessed/performed       Past Medical History:  Diagnosis Date  . Acute meniscal tear of knee    Left knee  . Alcohol problem drinking    rehab  . Anemia    menstrual related  . Anxiety   . Arthritis    right ankle-neuropathy  . Colon polyps   . Depression   . DM (diabetes mellitus), type 2 ([place]) 12/2009   type 2  . Evalluate for OSA (obstructive sleep apnea) 08/15/2013   No OSA on sleep study but may be underestimated.-never used cpap    . GERD (gastroesophageal reflux disease)    not currently taking.  Marland Kitchen Heart murmur   . History of recurrent UTIs   . Hyperlipidemia   . Hypertension   . Hypothyroidism   . Liver disease   . Neuromuscular disorder ([place])    neropathy bilateral feet.  . 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 Shortness of breath    with exertion   . Thyroid disease     Past Surgical History:  Procedure Laterality Date  . ADENOIDECTOMY    . BREAST SURGERY Right    lumpectomy-benign  . [place]   1 time  . CHOLECYSTECTOMY    . INNER EAR SURGERY     left ear had tubes put in and removed, scar tissue built up/ loss of hearing in left ear for over a year  . MASS EXCISION Left  12/16/2013   Procedure: EXCISION LEFT  DISTAL THIGH MASS;  Surgeon: Mauri Pole, MD;  Location: [facility];  Service: Orthopedics;  Laterality: Left;  . right ankle surgery      x 2- no retained hardware  . right rotator cuff surgery      left side  . TONSILLECTOMY    . TOTAL KNEE ARTHROPLASTY  2009   right  . TOTAL KNEE ARTHROPLASTY Left 05/05/2015   Procedure: LEFT TOTAL KNEE ARTHROPLASTY;  Surgeon: Paralee Cancel, MD;  Location: [facility];  Service: Orthopedics;  Laterality: Left;  . TOTAL KNEE REVISION Right 12/16/2013   Procedure: RIGHT TOTAL KNEE REVISION POLY EXCHANGE;  Surgeon: Mauri Pole, MD;  Location: [facility];  Service: Orthopedics;  Laterality: Right;  . TUBAL LIGATION  1987    There were no vitals filed for this visit.  Subjective Assessment - 07/15/19 1520    Subjective  I babysat my daughter's dog over the weekend so I have been busy.    Diagnostic tests  x-ray and MRI: stenosis C4-7    Patient Stated Goals  reduce pain, improve cervical  A/ROM    Currently in Pain?  Yes    Pain Score  1     Pain Location  Neck    Pain Orientation  Right    Pain Descriptors / Indicators  Tightness    Pain Type  Chronic pain    Pain Onset  More than a month ago    Pain Frequency  Constant    Aggravating Factors   turning head with driving, activity    Pain Relieving Factors  rest, heat    Pain Score  1    Pain Location  Back    Pain Orientation  Lower    Pain Descriptors / Indicators  Aching;Sharp    Pain Onset  More than a month ago                        [facility] Adult PT Treatment/Exercise - 07/15/19 0001      Lumbar Exercises: Stretches   Active Hamstring Stretch  3 reps;20 seconds;Left;Right    Lower Trunk Rotation  3 reps;10 seconds      Lumbar Exercises: Seated   Other Seated Lumbar Exercises  press into foam roll x 10      Lumbar Exercises: Supine   Other Supine Lumbar Exercises  horizontal abduction and D2 with green band 2x10 with core acivation and  ball squeeze between the knees       Lumbar Exercises: Sidelying   Other Sidelying Lumbar Exercises  open book stretch x10      Knee/Hip Exercises: Aerobic   Nustep  Level 3x 10 minutes       Shoulder Exercises: Seated   Other Seated Exercises  3 way raises: 1# 2x10      Neck Exercises: Stretches   Upper Trapezius Stretch  Left;Right;3 reps;20 seconds               PT Short Term Goals - 06/03/19 1533      PT SHORT TERM GOAL #2   Title  demonstrate cervical A/ROM rotation to > or = to 60 degrees bil to improve safety with driving    Status  Achieved      PT SHORT TERM GOAL #3   Title  report a 30% reduction in neck pain with daily activity    Status  Achieved        PT Long Term Goals - 06/12/19 1111      PT LONG TERM GOAL #1   Title  be independent in advanced HEP    Time  8    Period  Weeks    Status  On-going    Target Date  08/07/19      PT LONG TERM GOAL #2   Title  improve ODI to < or = to 21%    Time  8    Period  Weeks    Status  New    Target Date  07/24/19      PT LONG TERM GOAL #3   Title  demonstrate cervical A/ROM rotation to > or = to 75 degrees to improve safety with driving    Baseline  70 Rt, 80 Lt    Time  8    Period  Weeks    Status  On-going    Target Date  08/07/19      PT LONG TERM GOAL #4   Title  report < or = to 5/10 max neck pain with increased activity or flare-up  Status  Achieved      PT LONG TERM GOAL #5   Title  verbalize and demonstrate body mechanic modifications with abdominal bracing for lumbar protection with ADLs and self-care    Baseline  --    Time  8    Period  Weeks    Status  New    Target Date  08/07/19      PT LONG TERM GOAL #6   Title  report a 40% reduction in LBP with ADLs and self-care    Time  8    Period  Weeks    Status  New    Target Date  08/07/19            Plan - 07/15/19 1535    Clinical Impression Statement  Pt arrived with minimal pain in her neck and lumbar spine today.   Pt has been busy caring for her daughter's dog and experienced pain with this.  Pt was able to participate in strength and flexibility exercises without increased pain today.  Pt demonstrated good alignment with exercise and required verbal cues for abdominal activation in supine with exercises.   Pt will continue to benefit from skilled PT to address chronic neck and lumbar pain.    PT Frequency  2x / week    PT Duration  8 weeks    PT Treatment/Interventions  ADLs/Self Care Home Management;Cryotherapy;Ultrasound;Traction;Moist Heat;Electrical Stimulation;Functional mobility training;Neuromuscular re-education;Therapeutic activities;Therapeutic exercise;Patient/family education;Manual techniques;Taping;Dry needling;Spinal Manipulations;Joint Manipulations    PT Next Visit Plan  continue to address neck pain and flexibility, core and postural strength. Pt will be on vacation x 2 weeks    PT Home Exercise Plan  Access Code: [email or web address]    Consulted and Agree with Plan of Care  Patient       Patient will benefit from skilled therapeutic intervention in order to improve the following deficits and impairments:  Decreased activity tolerance, Decreased strength, Postural dysfunction, Improper body mechanics, Impaired flexibility, Pain, Impaired UE functional use, Increased muscle spasms, Decreased range of motion  Visit Diagnosis: Cervicalgia  Cramp and spasm  Abnormal posture  Chronic bilateral low back pain without sciatica     Problem List Patient Active Problem List   Diagnosis Date Noted  . Osteoarthritis of ankle, right 01/29/2018  . Disorder of tendon of posterior tibial muscle 07/06/2017  . Sleep related headaches 09/07/2016  . Nightmares REM-sleep type 09/07/2016  . RLS (restless legs syndrome) 09/07/2016  . Snoring 09/07/2016  . Excessive daytime sleepiness 09/07/2016  . Migraine 07/28/2016  . Solitary pulmonary nodule 06/08/2016  . S/P knee replacement 05/05/2015  . Former  smoker 07/10/2014  . Shortness of breath 06/07/2014  . Chest pain 06/07/2014  . Osteoarthritis 05/16/2014  . Alcoholic fatty liver [id number]  . Chronic post-traumatic stress disorder (PTSD) 05/02/2014  . Sleep disorder 05/02/2014  . Family history of alcoholism 05/02/2014  . Macrocytosis 04/19/2014  . Fatigue 08/15/2013  . Thyroid nodule 09/17/2012  . Hypothyroid 07/21/2010  . Type II diabetes mellitus, well controlled ([place]) 06/23/2010  . Hypertension associated with diabetes ([place]) 06/23/2010  . Alcohol use disorder, severe, in sustained remission ([place]) 06/23/2010  . Hyperlipidemia associated with type 2 diabetes mellitus ([place]) 06/23/2010  . Morbid obesity ([place]) 06/23/2010  . Chronic depression 06/23/2010  . History of colonic polyps 04/12/2010     Sigurd Sos, PT 07/15/19 4:09 PM  [facility] [address] [place], [place], [address] Phone: [phone]   Fax:  [phone]  Name: [name] [name] MRN: [id number] Date of Birth: 05/25/1955

## 2019-07-29 ENCOUNTER — Other Ambulatory Visit: Payer: Self-pay

## 2019-07-29 ENCOUNTER — Telehealth: Payer: Self-pay | Admitting: Family Medicine

## 2019-07-29 DIAGNOSIS — E119 Type 2 diabetes mellitus without complications: Secondary | ICD-10-CM

## 2019-07-29 NOTE — Telephone Encounter (Signed)
Patient is calling in wanting an appointment for lab work, so that way she is able to have it done before her appointment, okay to schedule or have patient wait?

## 2019-07-29 NOTE — Telephone Encounter (Signed)
Have put in orders please call patient to make lab app 2-3 days before her f/u with [facility].

## 2019-08-05 ENCOUNTER — Ambulatory Visit: Payer: Medicare Other | Attending: Orthopedic Surgery

## 2019-08-05 ENCOUNTER — Other Ambulatory Visit: Payer: Self-pay

## 2019-08-05 DIAGNOSIS — M542 Cervicalgia: Secondary | ICD-10-CM | POA: Insufficient documentation

## 2019-08-05 DIAGNOSIS — M545 Low back pain, unspecified: Secondary | ICD-10-CM

## 2019-08-05 DIAGNOSIS — G8929 Other chronic pain: Secondary | ICD-10-CM | POA: Diagnosis present

## 2019-08-05 DIAGNOSIS — R252 Cramp and spasm: Secondary | ICD-10-CM | POA: Diagnosis present

## 2019-08-05 DIAGNOSIS — R293 Abnormal posture: Secondary | ICD-10-CM | POA: Diagnosis present

## 2019-08-05 NOTE — Patient Instructions (Addendum)
DUE TO COVID-19 ONLY ONE VISITOR IS ALLOWED TO COME WITH YOU AND STAY IN THE WAITING ROOM ONLY DURING PRE OP AND PROCEDURE DAY OF SURGERY. THE 2 VISITORS  MAY VISIT WITH YOU AFTER SURGERY IN YOUR PRIVATE ROOM DURING VISITING HOURS ONLY!  YOU NEED TO HAVE A COVID 19 TEST ON_6/21__ @_1 :45.    THIS TEST MUST BE DONE BEFORE SURGERY, COME  [place], [place] , [address].  ([place]) ONCE YOUR COVID TEST IS COMPLETED, PLEASE BEGIN THE QUARANTINE INSTRUCTIONS AS OUTLINED IN YOUR HANDOUT.                [name] [name]    Your procedure is scheduled on: 08/15/19   Report to [facility] Main  Entrance   Report to Short Stay at 5:30  AM     Call this number if you have problems the morning of surgery [place], NO CHEWING GUM [place].   Do not eat food After Midnight.   YOU MAY HAVE CLEAR LIQUIDS FROM MIDNIGHT UNTIL 4:30AM.   At 4:30AM Please finish the prescribed Pre-Surgery Gatorade drink.   Nothing by mouth after you finish the Gatorade drink !   Take these medicines the morning of surgery with A SIP OF WATER: Wellbutrin, Lexapro, Levothyroxine                          ,use your inhaler and bring it with you to the hospital, mask and tubing  DO NOT TAKE ANY DIABETIC MEDICATIONS DAY OF YOUR SURGERY                      How to Manage Your Diabetes Before and After Surgery  Why is it important to control my blood sugar before and after surgery? . Improving blood sugar levels before and after surgery helps healing and can limit problems. . A way of improving blood sugar control is eating a healthy diet by: o  Eating less sugar and carbohydrates o  Increasing activity/exercise o  Talking with your doctor about reaching your blood sugar goals . High blood sugars (greater than 180 mg/dL) can raise your risk of infections and slow your recovery, so you will need to focus on  controlling your diabetes during the weeks before surgery. . Make sure that the doctor who takes care of your diabetes knows about your planned surgery including the date and location.  How do I manage my blood sugar before surgery? . Check your blood sugar at least 4 times a day, starting 2 days before surgery, to make sure that the level is not too high or low. o Check your blood sugar the morning of your surgery when you wake up and every 2 hours until you get to the Short Stay unit. . If your blood sugar is less than 70 mg/dL, you will need to treat for low blood sugar: o Do not take insulin. o Treat a low blood sugar (less than 70 mg/dL) with  cup of clear juice (cranberry or apple), 4 glucose tablets, OR glucose gel. o Recheck blood sugar in 15 minutes after treatment (to make sure it is greater than 70 mg/dL). If your blood sugar is not greater than 70 mg/dL on recheck, call [phone] for further instructions. . Report your blood sugar to the short stay nurse when you get  to Short Stay.  . If you are admitted to the hospital after surgery: o Your blood sugar will be checked by the staff and you will probably be given insulin after surgery (instead of oral diabetes medicines) to make sure you have good blood sugar levels. o The goal for blood sugar control after surgery is 80-180 mg/dL.   WHAT DO I DO ABOUT MY DIABETES MEDICATION?  Marland Kitchen Do not take oral diabetes medicines (pills) the morning of surgery.  .            You may not have any metal on your body including hair pins and              piercings  Do not wear jewelry, make-up, lotions, powders or perfumes, deodorant             Do not wear nail polish on your fingernails.              Do not shave  48 hours prior to surgery.     Do not bring valuables to the hospital. [place].  Contacts, dentures or bridgework may not be worn into surgery.                 Please read  over the following fact sheets you were given: _____________________________________________________________________             [facility] - Preparing for Surgery  Before surgery, you can play an important role.   Because skin is not sterile, your skin needs to be as free of germs as possible.   You can reduce the number of germs on your skin by washing with CHG (chlorahexidine gluconate) soap before surgery.   CHG is an antiseptic cleaner which kills germs and bonds with the skin to continue killing germs even after washing. Please DO NOT use if you have an allergy to CHG or antibacterial soaps.   If your skin becomes reddened/irritated stop using the CHG and inform your nurse when you arrive at Short Stay. Do not shave (including legs and underarms) for at least 48 hours prior to the first CHG shower.  . Please follow these instructions carefully:  1.  Shower with CHG Soap the night before surgery and the  morning of Surgery.  2.  If you choose to wash your hair, wash your hair first as usual with your  normal  shampoo.  3.  After you shampoo, rinse your hair and body thoroughly to remove the  shampoo.                                        4.  Use CHG as you would any other liquid soap.  You can apply chg directly  to the skin and wash                       Gently with a scrungie or clean washcloth.  5.  Apply the CHG Soap to your body ONLY FROM THE NECK DOWN.   Do not use on face/ open                           Wound or open sores. Avoid contact with eyes, ears mouth  and genitals (private parts).                       Wash face,  Genitals (private parts) with your normal soap.             6.  Wash thoroughly, paying special attention to the area where your surgery  will be performed.  7.  Thoroughly rinse your body with warm water from the neck down.  8.  DO NOT shower/wash with your normal soap after using and rinsing off  the CHG Soap.             9.  Pat yourself dry with a clean  towel.            10.  Wear clean pajamas.            11.  Place clean sheets on your bed the night of your first shower and do not  sleep with pets. Day of Surgery : Do not apply any lotions/deodorants the morning of surgery.  Please wear clean clothes to the hospital/surgery center.  FAILURE TO FOLLOW THESE INSTRUCTIONS MAY RESULT IN THE CANCELLATION OF YOUR SURGERY PATIENT SIGNATURE_________________________________  NURSE SIGNATURE__________________________________   Incentive Spirometer  An incentive spirometer is a tool that can help keep your lungs clear and active. This tool measures how well you are filling your lungs with each breath. Taking long deep breaths may help reverse or decrease the chance of developing breathing (pulmonary) problems (especially infection) following:  A long period of time when you are unable to move or be active. BEFORE THE PROCEDURE   If the spirometer includes an indicator to show your best effort, your nurse or respiratory therapist will set it to a desired goal.  If possible, sit up straight or lean slightly forward. Try not to slouch.  Hold the incentive spirometer in an upright position. INSTRUCTIONS FOR USE  1. Sit on the edge of your bed if possible, or sit up as far as you can in bed or on a chair. 2. Hold the incentive spirometer in an upright position. 3. Breathe out normally. 4. Place the mouthpiece in your mouth and seal your lips tightly around it. 5. Breathe in slowly and as deeply as possible, raising the piston or the ball toward the top of the column. 6. Hold your breath for 3-5 seconds or for as long as possible. Allow the piston or ball to fall to the bottom of the column. 7. Remove the mouthpiece from your mouth and breathe out normally. 8. Rest for a few seconds and repeat Steps 1 through 7 at least 10 times every 1-2 hours when you are awake. Take your time and take a few normal breaths between deep breaths. 9. The spirometer  may include an indicator to show your best effort. Use the indicator as a goal to work toward during each repetition. 10. After each set of 10 deep breaths, practice coughing to be sure your lungs are clear. If you have an incision (the cut made at the time of surgery), support your incision when coughing by placing a pillow or rolled up towels firmly against it. Once you are able to get out of bed, walk around indoors and cough well. You may stop using the incentive spirometer when instructed by your caregiver.  RISKS AND COMPLICATIONS  Take your time so you do not get dizzy or light-headed.  If you are in pain, you may need to take  or ask for pain medication before doing incentive spirometry. It is harder to take a deep breath if you are having pain. AFTER USE  Rest and breathe slowly and easily.  It can be helpful to keep track of a log of your progress. Your caregiver can provide you with a simple table to help with this. If you are using the spirometer at home, follow these instructions: [place] IF:   You are having difficultly using the spirometer.  You have trouble using the spirometer as often as instructed.  Your pain medication is not giving enough relief while using the spirometer.  You develop fever of 100.5 F (38.1 C) or higher. SEEK IMMEDIATE MEDICAL CARE IF:   You cough up bloody sputum that had not been present before.  You develop fever of 102 F (38.9 C) or greater.  You develop worsening pain at or near the incision site. MAKE SURE YOU:   Understand these instructions.  Will watch your condition.  Will get help right away if you are not doing well or get worse. Document Released: 06/20/2006 Document Revised: 05/02/2011 Document Reviewed: 08/21/2006 [facility] Patient Information 2014 ExitCare,  [place].   ________________________________________________________________________  ________________________________________________________________________

## 2019-08-05 NOTE — Therapy (Signed)
[facility] [address], [place], [place], [address] Phone: [phone]   Fax:  [phone]  Physical Therapy Treatment  Patient Details  Name: [name] [name] MRN: [id number] Date of Birth: Feb 09, 1956 Referring Provider (PT): Arvella Merles, MD   Encounter Date: 08/05/2019   PT End of Session - 08/05/19 1614    Visit Number 17    PT Start Time 5697    PT Stop Time 9480    PT Time Calculation (min) 35 min    Activity Tolerance Patient tolerated treatment well    Behavior During Therapy [facility] for tasks assessed/performed           Past Medical History:  Diagnosis Date  . Acute meniscal tear of knee    Left knee  . Alcohol problem drinking    rehab  . Anemia    menstrual related  . Anxiety   . Arthritis    right ankle-neuropathy  . Colon polyps   . Depression   . DM (diabetes mellitus), type 2 ([place]) 12/2009   type 2  . Evalluate for OSA (obstructive sleep apnea) 08/15/2013   No OSA on sleep study but may be underestimated.-never used cpap    . GERD (gastroesophageal reflux disease)    not currently taking.  Marland Kitchen Heart murmur   . History of recurrent UTIs   . Hyperlipidemia   . Hypertension   . Hypothyroidism   . Liver disease   . Neuromuscular disorder ([place])    neropathy bilateral feet.  . 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 Shortness of breath    with exertion   . Thyroid disease     Past Surgical History:  Procedure Laterality Date  . ADENOIDECTOMY    . BREAST SURGERY Right    lumpectomy-benign  . [place]   1 time  . CHOLECYSTECTOMY    . INNER EAR SURGERY     left ear had tubes put in and removed, scar tissue built up/ loss of hearing in left ear for over a year  . MASS EXCISION Left 12/16/2013   Procedure: EXCISION LEFT  DISTAL THIGH MASS;  Surgeon: Mauri Pole, MD;  Location: [facility];  Service: Orthopedics;  Laterality: Left;  .  right ankle surgery      x 2- no retained hardware  . right rotator cuff surgery      left side  . TONSILLECTOMY    . TOTAL KNEE ARTHROPLASTY  2009   right  . TOTAL KNEE ARTHROPLASTY Left 05/05/2015   Procedure: LEFT TOTAL KNEE ARTHROPLASTY;  Surgeon: Paralee Cancel, MD;  Location: [facility];  Service: Orthopedics;  Laterality: Left;  . TOTAL KNEE REVISION Right 12/16/2013   Procedure: RIGHT TOTAL KNEE REVISION POLY EXCHANGE;  Surgeon: Mauri Pole, MD;  Location: [facility];  Service: Orthopedics;  Laterality: Right;  . TUBAL LIGATION  1987    There were no vitals filed for this visit.   Subjective Assessment - 08/05/19 1540    Subjective I got back from [place] last week.  I am feeling stiff.    Currently in Pain? Yes    Pain Score 2     Pain Location Neck    Pain Orientation Right    Pain Descriptors / Indicators Tightness    Pain Type Chronic pain    Pain Onset More than a month ago    Pain Frequency Constant    Aggravating  Factors  turning head with driving, activity    Pain Relieving Factors rest, heat    Pain Score 2   up to 7/10   Pain Location Back    Pain Orientation Lower    Pain Descriptors / Indicators Aching;Sharp    Pain Type Chronic pain    Pain Onset More than a month ago    Pain Frequency Constant    Aggravating Factors  depends on what I do    Pain Relieving Factors heat, rest, muscle relaxer              [facility] PT Assessment - 08/05/19 0001      Assessment   Medical Diagnosis cervicalgia, low back pain    Referring Provider (PT) Arvella Merles, MD    Onset Date/Surgical Date 03/03/19   low back: chronic 1980     Prior Function   Level of Independence Independent    Vocation Retired    Leisure crafts, baking/cooking      Cognition   Overall Cognitive Status Within Functional Limits for tasks assessed      Observation/Other Assessments   Focus on Therapeutic Outcomes (FOTO)  37% limitation    Other Surveys  Oswestry Disability Index    Oswestry  Disability Index  35%       AROM   Cervical - Right Side Bend 35    Cervical - Left Side Bend 40    Cervical - Right Rotation 75    Cervical - Left Rotation 75                         [facility] Adult PT Treatment/Exercise - 08/05/19 0001      Knee/Hip Exercises: Aerobic   Nustep Level 3x 10 minutes    PT present to discuss progress     Neck Exercises: Stretches   Upper Trapezius Stretch Left;Right;3 reps;20 seconds    Other Neck Stretches flexion and rotation 3x20 seconds                    PT Short Term Goals - 08/05/19 1544      PT SHORT TERM GOAL #1   Title be independent in initial HEP             PT Long Term Goals - 08/05/19 1545      PT LONG TERM GOAL #1   Title be independent in advanced HEP    Status Achieved      PT LONG TERM GOAL #2   Title improve ODI to < or = to 21%    Baseline 34%    Status Partially Met      PT LONG TERM GOAL #3   Title demonstrate cervical A/ROM rotation to > or = to 75 degrees to improve safety with driving    Baseline 75 bil      PT LONG TERM GOAL #4   Title report < or = to 5/10 max neck pain with increased activity or flare-up    Status Achieved      PT LONG TERM GOAL #5   Title verbalize and demonstrate body mechanic modifications with abdominal bracing for lumbar protection with ADLs and self-care    Status Achieved      PT LONG TERM GOAL #6   Title report a 40% reduction in LBP with ADLs and self-care    Baseline 25%    Status Partially Met  Plan - 08/05/19 1617    Clinical Impression Statement Pt is ready to D/C to HEP prior to having Lt TKA scheduled later this month.  Pt reports 25% improvement in lumbar pain since the start of care and 3/10 max neck pain with activity.  Pain in the lumbar spine does increase to 7/10 depending on activity.  Pt will continue to HEP for both regions to manage this chronic condition.  ODI is 34% and FOTO for neck is improved to 37%  limitation.  Pt with overall improved cervical A/ROM and postural awareness.  Pt will D/C to HEP today.    PT Next Visit Plan D/C PT today    PT Home Exercise Plan Access Code: [email or web address]    Consulted and Agree with Plan of Care Patient           Patient will benefit from skilled therapeutic intervention in order to improve the following deficits and impairments:  Decreased activity tolerance, Decreased strength, Postural dysfunction, Improper body mechanics, Impaired flexibility, Pain, Impaired UE functional use, Increased muscle spasms, Decreased range of motion  Visit Diagnosis: Cervicalgia  Cramp and spasm  Abnormal posture  Chronic bilateral low back pain without sciatica     Problem List Patient Active Problem List   Diagnosis Date Noted  . Osteoarthritis of ankle, right 01/29/2018  . Disorder of tendon of posterior tibial muscle 07/06/2017  . Sleep related headaches 09/07/2016  . Nightmares REM-sleep type 09/07/2016  . RLS (restless legs syndrome) 09/07/2016  . Snoring 09/07/2016  . Excessive daytime sleepiness 09/07/2016  . Migraine 07/28/2016  . Solitary pulmonary nodule 06/08/2016  . S/P knee replacement 05/05/2015  . Former smoker 07/10/2014  . Shortness of breath 06/07/2014  . Chest pain 06/07/2014  . Osteoarthritis 05/16/2014  . Alcoholic fatty liver 73/42/8768  . Chronic post-traumatic stress disorder (PTSD) 05/02/2014  . Sleep disorder 05/02/2014  . Family history of alcoholism 05/02/2014  . Macrocytosis 04/19/2014  . Fatigue 08/15/2013  . Thyroid nodule 09/17/2012  . Hypothyroid 07/21/2010  . Type II diabetes mellitus, well controlled ([place]) 06/23/2010  . Hypertension associated with diabetes ([place]) 06/23/2010  . Alcohol use disorder, severe, in sustained remission ([place]) 06/23/2010  . Hyperlipidemia associated with type 2 diabetes mellitus ([place]) 06/23/2010  . Morbid obesity ([place]) 06/23/2010  . Chronic depression 06/23/2010  . History of colonic polyps  04/12/2010   PHYSICAL THERAPY DISCHARGE SUMMARY  Visits from Start of Care: 17  Current functional level related to goals / functional outcomes: See above for most current status.  Pt will be having knee replacement next week.     Remaining deficits: Chronic lumbar and cervical pain.  Pt will continue with HEP.   Education / Equipment: HEP, posture/body mechanics Plan: Patient agrees to discharge.  Patient goals were partially met. Patient is being discharged due to being pleased with the current functional level.  ?????        Sigurd Sos, PT 08/05/19 4:18 PM  [facility] [address], [place], [place], [address] Phone: [phone]   Fax:  [phone]  Name: Hajra Port MRN: [id number] Date of Birth: Sep 01, 1955

## 2019-08-06 ENCOUNTER — Other Ambulatory Visit: Payer: Self-pay

## 2019-08-06 ENCOUNTER — Encounter (HOSPITAL_COMMUNITY): Payer: Self-pay

## 2019-08-06 ENCOUNTER — Encounter (HOSPITAL_COMMUNITY)
Admission: RE | Admit: 2019-08-06 | Discharge: 2019-08-06 | Disposition: A | Discharge status: Home / Self Care | Payer: Medicare Other | Source: Ambulatory Visit | Attending: Orthopedic Surgery | Admitting: Orthopedic Surgery

## 2019-08-06 DIAGNOSIS — E119 Type 2 diabetes mellitus without complications: Secondary | ICD-10-CM | POA: Diagnosis not present

## 2019-08-06 DIAGNOSIS — Z01818 Encounter for other preprocedural examination: Secondary | ICD-10-CM | POA: Diagnosis present

## 2019-08-06 NOTE — Progress Notes (Signed)
COVID Vaccine Completed:Yes Date COVID Vaccine completed:05/15/19 COVID vaccine manufacturer:  Moderna     PCP - Dr. [name] Cardiologist - Dr. Corky Downs  Chest x-ray - no EKG - 08/07/19 Stress Test - 2016 ECHO - 2019 Cardiac Cath - no  Sleep Study - yes CPAP - yes  Fasting Blood Sugar - 93-139 Checks Blood Sugar _QD____ times a day- 1/week  Blood Thinner Instructions:NA Aspirin Instructions: Last Dose:  Anesthesia review:   Patient denies shortness of breath, fever, cough and chest pain at PAT appointmentyes   Patient verbalized understanding of instructions that were given to them at the PAT appointment. Patient was also instructed that they will need to review over the PAT instructions again at home before surgery.yes  Pt reports that she has SOB climbing one flight of stairs. She doesn't do housework. She is sedentary

## 2019-08-07 ENCOUNTER — Telehealth: Payer: Self-pay | Admitting: Family Medicine

## 2019-08-07 ENCOUNTER — Encounter (HOSPITAL_COMMUNITY)
Admission: RE | Admit: 2019-08-07 | Discharge: 2019-08-07 | Disposition: A | Discharge status: Home / Self Care | Payer: Medicare Other | Source: Ambulatory Visit | Attending: Orthopedic Surgery | Admitting: Orthopedic Surgery

## 2019-08-07 ENCOUNTER — Encounter: Payer: Self-pay | Admitting: Family Medicine

## 2019-08-07 DIAGNOSIS — Z01818 Encounter for other preprocedural examination: Secondary | ICD-10-CM | POA: Diagnosis not present

## 2019-08-07 DIAGNOSIS — E119 Type 2 diabetes mellitus without complications: Secondary | ICD-10-CM | POA: Diagnosis not present

## 2019-08-07 LAB — COMPREHENSIVE METABOLIC PANEL
ALT: 20 U/L (ref 0–44)
AST: 23 U/L (ref 15–41)
Albumin: 3.7 g/dL (ref 3.5–5.0)
Alkaline Phosphatase: 62 U/L (ref 38–126)
Anion gap: 10 (ref 5–15)
BUN: 21 mg/dL (ref 8–23)
CO2: 23 mmol/L (ref 22–32)
Calcium: 9.5 mg/dL (ref 8.9–10.3)
Chloride: 107 mmol/L (ref 98–111)
Creatinine, Ser: 1.09 mg/dL — ABNORMAL HIGH (ref 0.44–1.00)
GFR calc Af Amer: >60 mL/min (ref >60)
GFR calc non Af Amer: 54 mL/min — ABNORMAL LOW (ref >60)
Glucose, Bld: 172 mg/dL — ABNORMAL HIGH (ref 70–99)
Potassium: 4.3 mmol/L (ref 3.5–5.1)
Sodium: 140 mmol/L (ref 135–145)
Total Bilirubin: 1.1 mg/dL (ref 0.3–1.2)
Total Protein: 6.7 g/dL (ref 6.5–8.1)

## 2019-08-07 LAB — CBC
HCT: 42.1 % (ref 36.0–46.0)
Hemoglobin: 14.3 g/dL (ref 12.0–15.0)
MCH: 32.5 pg (ref 26.0–34.0)
MCHC: 34 g/dL (ref 30.0–36.0)
MCV: 95.7 fL (ref 80.0–100.0)
Platelets: 205 10*3/uL (ref 150–400)
RBC: 4.4 MIL/uL (ref 3.87–5.11)
RDW: 13.2 % (ref 11.5–15.5)
WBC: 5.1 10*3/uL (ref 4.0–10.5)
nRBC: 0 % (ref 0.0–0.2)

## 2019-08-07 LAB — SURGICAL PCR SCREEN
MRSA, PCR: NEGATIVE
Staphylococcus aureus: NEGATIVE

## 2019-08-07 LAB — GLUCOSE, CAPILLARY: Glucose-Capillary: 176 mg/dL — ABNORMAL HIGH (ref 70–99)

## 2019-08-07 NOTE — Telephone Encounter (Signed)
Patient states she just had labs completed for pre op.  Would like to know if she still needs to complete lab appointment?  Patient also states a pre authorization for surgery was sent over.    Patient wanted make sure that Dr. Yong Channel has this for her upcoming appointment with him.

## 2019-08-08 ENCOUNTER — Other Ambulatory Visit: Payer: 59

## 2019-08-08 LAB — HEMOGLOBIN A1C
Hgb A1c MFr Bld: 6.7 % — ABNORMAL HIGH (ref 4.8–5.6)
Mean Plasma Glucose: 146 mg/dL

## 2019-08-08 NOTE — Telephone Encounter (Signed)
Pt lab appointment with [place] cancelled and we do have her pre op form.

## 2019-08-09 ENCOUNTER — Ambulatory Visit (INDEPENDENT_AMBULATORY_CARE_PROVIDER_SITE_OTHER): Payer: Medicare Other | Admitting: Family Medicine

## 2019-08-09 ENCOUNTER — Other Ambulatory Visit: Payer: Self-pay

## 2019-08-09 ENCOUNTER — Encounter: Payer: Self-pay | Admitting: Family Medicine

## 2019-08-09 VITALS — BP 124/68 | HR 73 | Temp 97.6°F | Ht 66.0 in | Wt 257.0 lb

## 2019-08-09 DIAGNOSIS — E039 Hypothyroidism, unspecified: Secondary | ICD-10-CM | POA: Diagnosis not present

## 2019-08-09 DIAGNOSIS — K700 Alcoholic fatty liver: Secondary | ICD-10-CM | POA: Diagnosis not present

## 2019-08-09 DIAGNOSIS — E1159 Type 2 diabetes mellitus with other circulatory complications: Secondary | ICD-10-CM

## 2019-08-09 DIAGNOSIS — E119 Type 2 diabetes mellitus without complications: Secondary | ICD-10-CM | POA: Diagnosis not present

## 2019-08-09 DIAGNOSIS — I10 Essential (primary) hypertension: Secondary | ICD-10-CM

## 2019-08-09 DIAGNOSIS — F1021 Alcohol dependence, in remission: Secondary | ICD-10-CM

## 2019-08-09 MED ORDER — ESCITALOPRAM OXALATE 20 MG PO TABS: 20.0000 mg | ORAL_TABLET | Freq: Every day | ORAL | 3 refills | Status: DC

## 2019-08-09 MED ORDER — SIMVASTATIN 20 MG PO TABS: 20.0000 mg | ORAL_TABLET | Freq: Every day | ORAL | 3 refills | Status: DC

## 2019-08-09 MED ORDER — BENAZEPRIL HCL 20 MG PO TABS: 20.0000 mg | ORAL_TABLET | Freq: Every day | ORAL | 3 refills | Status: DC

## 2019-08-09 MED ORDER — BLOOD GLUCOSE MONITOR KIT: PACK | 0 refills | Status: DC

## 2019-08-09 MED ORDER — LEVOTHYROXINE SODIUM 88 MCG PO TABS: 88.0000 ug | ORAL_TABLET | Freq: Every day | ORAL | 3 refills | Status: DC

## 2019-08-09 MED ORDER — BUPROPION HCL ER (XL) 150 MG PO TB24: 150.0000 mg | ORAL_TABLET | Freq: Every day | ORAL | 3 refills | Status: DC

## 2019-08-09 MED ORDER — GLIMEPIRIDE 4 MG PO TABS: 4.0000 mg | ORAL_TABLET | Freq: Every day | ORAL | 3 refills | Status: DC

## 2019-08-09 MED ORDER — METFORMIN HCL ER 500 MG PO TB24: 1000.0000 mg | ORAL_TABLET | Freq: Two times a day (BID) | ORAL | 3 refills | Status: DC

## 2019-08-09 NOTE — Addendum Note (Signed)
Addended by: Francella Solian on: 08/09/2019 02:13 PM   Modules accepted: Orders

## 2019-08-09 NOTE — Patient Instructions (Addendum)
Please set her up with a new meter and strips- seems like her #s are off at home  Good control of diabetes.  Unfortunately Janumet is very expensive.  We will stop Janumet.  We will start Metformin extended release 1000 mg twice daily.  We will increase glimepiride to 4 mg.  She has been on a "sugar binge" and she agrees to try to cut back on this-she will let us know if she has any low blood sugars and we may need to reduce back to 2 mg glimepiride or stop this.  Cut back on those sugars including chocolate milk!   Team also see if Tammy can change banner- its saying [employer] but she is medicare with AARP supplement not advantage or private.   Happy early birthday! Welcome to the confusion of medicare!   Recommended follow up:  4 month WELCOME TO MEDICARE labs at least a few days before (ordered today). If you chose to get at [facility] instead- have our team print you a copy and you can drop it off at [facility].

## 2019-08-09 NOTE — Progress Notes (Signed)
Phone [phone] In person visit   Subjective:   [name] [name] is a 64 y.o. year old very pleasant female patient who presents for/with See problem oriented charting Chief Complaint  Patient presents with  . Diabetes   This visit occurred during the SARS-CoV-2 public health emergency.  Safety protocols were in place, including screening questions prior to the visit, additional usage of staff PPE, and extensive cleaning of exam room while observing appropriate contact time as indicated for disinfecting solutions.   Past Medical History-  Patient Active Problem List   Diagnosis Date Noted  . Solitary pulmonary nodule 06/08/2016    Priority: High  . Alcoholic fatty liver 83/15/1761    Priority: High  . Type II diabetes mellitus, well controlled ([place]) 06/23/2010    Priority: High  . Alcohol use disorder, severe, in sustained remission ([place]) 06/23/2010    Priority: High  . Morbid obesity ([place]) 06/23/2010    Priority: High  . Chronic depression 06/23/2010    Priority: High  . Migraine 07/28/2016    Priority: Medium  . Hypothyroid 07/21/2010    Priority: Medium  . Hypertension associated with diabetes ([place]) 06/23/2010    Priority: Medium  . Hyperlipidemia associated with type 2 diabetes mellitus ([place]) 06/23/2010    Priority: Medium  . S/P knee replacement 05/05/2015    Priority: Low  . Former smoker 07/10/2014    Priority: Low  . Osteoarthritis 05/16/2014    Priority: Low  . Chronic post-traumatic stress disorder (PTSD) 05/02/2014    Priority: Low  . Sleep disorder 05/02/2014    Priority: Low  . Family history of alcoholism 05/02/2014    Priority: Low  . Macrocytosis 04/19/2014    Priority: Low  . Fatigue 08/15/2013    Priority: Low  . Thyroid nodule 09/17/2012    Priority: Low  . History of colonic polyps 04/12/2010    Priority: Low  . Osteoarthritis of ankle, right 01/29/2018  . Disorder of tendon of posterior tibial muscle 07/06/2017  . Sleep related  headaches 09/07/2016  . Nightmares REM-sleep type 09/07/2016  . RLS (restless legs syndrome) 09/07/2016  . Snoring 09/07/2016  . Excessive daytime sleepiness 09/07/2016  . Shortness of breath 06/07/2014  . Chest pain 06/07/2014    Medications- reviewed and updated Current Outpatient Medications  Medication Sig Dispense Refill  . albuterol (PROVENTIL HFA;VENTOLIN HFA) 108 (90 Base) MCG/ACT inhaler Inhale 2 puffs into the lungs every 6 (six) hours as needed for wheezing or shortness of breath. 1 Inhaler 6  . benazepril (LOTENSIN) 20 MG tablet TAKE 1 TABLET BY MOUTH EVERY DAY (Patient taking differently: Take 20 mg by mouth daily. ) 90 tablet 1  . buPROPion (WELLBUTRIN XL) 150 MG 24 hr tablet Take 1 tablet (150 mg total) by mouth daily. 90 tablet 3  . diclofenac sodium (VOLTAREN) 1 % GEL Apply 1 application topically 2 (two) times daily as needed (pain).     Marland Kitchen escitalopram (LEXAPRO) 20 MG tablet TAKE 1 TABLET BY MOUTH EVERY DAY (Patient taking differently: Take 20 mg by mouth daily. ) 90 tablet 1  . gabapentin (NEURONTIN) 100 MG capsule Take 100 mg by mouth 2 (two) times daily.    Marland Kitchen glucose blood (ONETOUCH VERIO) test strip USE TO CHECK BLOOD SUGAR DAILY AND PRN 100 each 12  . levothyroxine (SYNTHROID) 88 MCG tablet Take 1 tablet (88 mcg total) by mouth daily. (Patient taking differently: Take 88 mcg by mouth daily before breakfast. ) 90 tablet 2  . magnesium gluconate (MAGONATE)  500 MG tablet Take 500 mg by mouth at bedtime.    . simvastatin (ZOCOR) 20 MG tablet TAKE 1 TABLET BY MOUTH EVERYDAY AT BEDTIME (Patient taking differently: Take 20 mg by mouth at bedtime. TAKE 1 TABLET BY MOUTH EVERYDAY AT BEDTIME) 90 tablet 1  . tiZANidine (ZANAFLEX) 4 MG capsule Take 4 mg by mouth at bedtime.     No current facility-administered medications for this visit.     Objective:  BP 124/68   Pulse 73   Temp 97.6 F (36.4 C) (Temporal)   Ht 5\' 6"  (1.676 m)   Wt 257 lb (116.6 kg)   SpO2 95%   BMI  41.48 kg/m  Gen: NAD, resting comfortably CV: RRR no murmurs rubs or gallops Lungs: CTAB no crackles, wheeze, rhonchi Abdomen: soft/nontender/nondistended/normal bowel sounds.  Ext: no edema Skin: warm, dry    Assessment and Plan   # neck issues- considering trial of a different injection. Working with Dr. Nelva Bush  # knee-total knee revision on the left- she is hoping not total knee. Shortness of breath with stairs- if requested by surgery would need formal cardiology clearance as cannot complete 4 mets.   # pulmonary nodule- due October 2019 for repeat. She reports SOB stbale since that time  # Diabetes S: Medication: Glimepiride 2 mg stopped last visit but she restarted as sugar increased.  On janumet 50-1000. Patient would like to know if another option for the Janumet due to cost . CBGs- needs new meter. Got a variation over 50 points at hospital.  Exercise and diet-  Reports sugar binge since covid started Lab Results  Component Value Date   HGBA1C 6.7 (H) 08/07/2019   HGBA1C 4.8 01/25/2019   HGBA1C 5.3 11/06/2017   A/P: Good control of diabetes.  Unfortunately Janumet is very expensive.  We will stop Janumet.  We will start Metformin extended release 1000 mg twice daily.  We will increase glimepiride to 4 mg.  She has been on a "sugar binge" and she agrees to try to cut back on this-she will let us know if she has any low blood sugars and we may need to reduce back to 2 mg glimepiride or stop this.  # Obesity morbid  S: sugar binge recently and not excercise  Wt Readings from Last 3 Encounters:  08/09/19 257 lb (116.6 kg)  08/07/19 258 lb (117 kg)  02/08/19 245 lb 6.4 oz (111.3 kg)  A/P: her goals include- hoping can be more active after knee replacement. Wants to cut back on sugar intake - advised cut chocolate milks! Advised water instead.  -Encouraged need for healthy eating, regular exercise, weight loss.   #Chronic kidney disease stage III S: GFR is typically in the  50srange on last 2 checks -Patient knows to avoid NSAIDs . Had been on celebrex in past but remains off A/P: we discussed importance of staying well hydrated and controlling risk factors- continue to monitor at least every 6 months.    #alcoholic Fatty liver/history of alcoholism in remission-over 5 years free S: thankfully LFTs remain well controlled and she remains alcohol free  Lab Results  Component Value Date   ALT 20 08/07/2019   AST 23 08/07/2019   ALKPHOS 62 08/07/2019   BILITOT 1.1 08/07/2019  A/P: doing well- continue to monitor and remain alcohol free  #hypertension S: medication: benazepril 20mg  BP Readings from Last 3 Encounters:  08/09/19 124/68  08/07/19 132/64  02/08/19 120/60  A/P: Stable. Continue current medications.   #  hyperlipidemia S: Medication:simvastatin 40mg   Lab Results  Component Value Date   CHOL 120 01/25/2019   HDL 60 01/25/2019   LDLCALC 46 01/25/2019   LDLDIRECT 158.6 02/26/2014   TRIG 56 01/25/2019   CHOLHDL 2.0 01/25/2019   A/P: Stable. Continue current medications.   # Depression S: Medication:Reduced to Wellbutrin 150 mg extended release last visit and continued Lexapro 20 mg. Depression screen [facility] 2/9 08/09/2019 06/19/2018 11/13/2017  Decreased Interest 0 1 0  Down, Depressed, Hopeless 0 0 -  PHQ - 2 Score 0 1 0  Altered sleeping 0 0 3  Tired, decreased energy 0 1 3  Change in appetite 0 0 1  Feeling bad or failure about yourself  0 0 0  Trouble concentrating 0 1 2  Moving slowly or fidgety/restless 0 0 0  Suicidal thoughts 0 0 0  PHQ-9 Score 0 3 9  Difficult doing work/chores Not difficult at all Not difficult at all Very difficult  Some recent data might be hidden  A/P: discussed further reducing but she wants to hold off for now. Remains on full remission- continue current medicine   #hypothyroidism S: compliant On thyroid medication-levothyroxine 88 mcg  Lab Results  Component Value Date   TSH 1.40 01/25/2019    A/P:Stable. Continue current medications.  Recheck TSH next visit    Recommended follow up:  4 month follow up  Future Appointments  Date Time Provider [place]  08/12/2019  1:45 PM [facility] None  09/02/2019  3:30 PM Danie Binder, PT [facility]  09/04/2019  3:30 PM Danie Binder, PT [facility]  09/05/2019  3:30 PM Sherol Dade, PT [facility]  09/09/2019  3:30 PM Danie Binder, PT [facility]  09/11/2019  3:30 PM Danie Binder, PT [facility]  09/12/2019  3:30 PM Sherol Dade, PT [facility]  09/16/2019  3:30 PM Danie Binder, PT [facility]  09/18/2019  3:30 PM Danie Binder, PT [facility]  09/19/2019  3:30 PM Alvera Singh, PT [facility]  09/24/2019  3:30 PM Sherol Dade, PT [facility]  09/26/2019  3:30 PM Sherol Dade, PT [facility]  09/30/2019  3:30 PM Danie Binder, PT [facility]  10/02/2019  3:30 PM Danie Binder, PT [facility]  10/03/2019  3:30 PM Sherol Dade, PT [facility]   Lab/Order associations:   ICD-10-CM   1. Type II diabetes mellitus, well controlled ([place])  E11.9   2. Hypothyroidism  E03.9 levothyroxine (SYNTHROID) 88 MCG tablet  3. Alcohol use disorder, severe, in sustained remission ([place])  F10.21   4. Alcoholic fatty liver  K46.2   5. Hypertension associated with diabetes ([place]) Chronic E11.59    I10   6. Morbid obesity ([place]) Chronic E66.01    Return precautions advised.  [name], MD

## 2019-08-12 ENCOUNTER — Other Ambulatory Visit (HOSPITAL_COMMUNITY)
Admission: RE | Admit: 2019-08-12 | Discharge: 2019-08-12 | Disposition: A | Discharge status: Home / Self Care | Payer: Medicare Other | Source: Ambulatory Visit | Attending: Orthopedic Surgery | Admitting: Orthopedic Surgery

## 2019-08-12 ENCOUNTER — Encounter: Payer: Self-pay | Admitting: Family Medicine

## 2019-08-12 ENCOUNTER — Telehealth: Payer: Self-pay | Admitting: Family Medicine

## 2019-08-12 DIAGNOSIS — Z01812 Encounter for preprocedural laboratory examination: Secondary | ICD-10-CM | POA: Insufficient documentation

## 2019-08-12 DIAGNOSIS — Z20822 Contact with and (suspected) exposure to covid-19: Secondary | ICD-10-CM | POA: Diagnosis not present

## 2019-08-12 LAB — SARS CORONAVIRUS 2 (TAT 6-24 HRS): SARS Coronavirus 2: NEGATIVE

## 2019-08-12 NOTE — Telephone Encounter (Signed)
Seth Bake is calling from [facility] asking if we have received the surgical clearance form that was faxed back in April and then again today. They stated they needed it before the end of the week or else they will not be able to do her procedure. Verified fax number

## 2019-08-12 NOTE — H&P (Signed)
TOTAL KNEE REVISION ADMISSION H&P  Patient is being admitted for left revision total knee arthroplasty (likely isolated tibial revision) .  Subjective:  Chief Complaint:   Left knee pain s/p TKA  HPI: [name] [name], 64 y.o. female, has a history of pain and functional disability in the left knee(s) due to failed previous arthroplasty and patient has failed non-surgical conservative treatments for greater than 12 weeks to include NSAID's and/or analgesics, use of assistive devices and activity modification. The indications for the revision of the total knee arthroplasty are loosening of one or more components. Onset of symptoms was gradual starting  years ago with rapidlly worsening course since that time.  Prior procedures on the left knee(s) include arthroplasty.  Patient currently rates pain in the left knee(s) at 10 out of 10 with activity. There is night pain, worsening of pain with activity and weight bearing, pain that interferes with activities of daily living, pain with passive range of motion, crepitus and joint swelling.  Patient has evidence of prosthetic loosening by imaging studies. This condition presents safety issues increasing the risk of falls.   There is no current active infection.  Risks, benefits and expectations were discussed with the patient.  Risks including but not limited to the risk of anesthesia, blood clots, nerve damage, blood vessel damage, failure of the prosthesis, infection and up to and including death.  Patient understand the risks, benefits and expectations and wishes to proceed with surgery.  PCP: Marin Olp, MD  D/C Plans:       Home (would like 2 nights)  Post-op Meds:       No Rx given  Tranexamic Acid:      To be given - IV   Decadron:      Is to be given  FYI:      ASA  Norco  DME:   Rx sent for - RW & 3-n-1  PT:   OPPT [facility]  Pharmacy: [facility] - [address]    Patient Active Problem List   Diagnosis Date Noted  .  Osteoarthritis of ankle, right 01/29/2018  . Disorder of tendon of posterior tibial muscle 07/06/2017  . Sleep related headaches 09/07/2016  . Nightmares REM-sleep type 09/07/2016  . RLS (restless legs syndrome) 09/07/2016  . Snoring 09/07/2016  . Excessive daytime sleepiness 09/07/2016  . Migraine 07/28/2016  . Solitary pulmonary nodule 06/08/2016  . S/P knee replacement 05/05/2015  . Former smoker 07/10/2014  . Shortness of breath 06/07/2014  . Chest pain 06/07/2014  . Osteoarthritis 05/16/2014  . Alcoholic fatty liver 80/99/8338  . Chronic post-traumatic stress disorder (PTSD) 05/02/2014  . Sleep disorder 05/02/2014  . Family history of alcoholism 05/02/2014  . Macrocytosis 04/19/2014  . Fatigue 08/15/2013  . Thyroid nodule 09/17/2012  . Hypothyroid 07/21/2010  . Type II diabetes mellitus, well controlled ([place]) 06/23/2010  . Hypertension associated with diabetes ([place]) 06/23/2010  . Alcohol use disorder, severe, in sustained remission ([place]) 06/23/2010  . Hyperlipidemia associated with type 2 diabetes mellitus ([place]) 06/23/2010  . Morbid obesity ([place]) 06/23/2010  . Chronic depression 06/23/2010  . History of colonic polyps 04/12/2010   Past Medical History:  Diagnosis Date  . Acute meniscal tear of knee    Left knee  . Alcohol problem drinking    rehab  . Anemia    menstrual related  . Anxiety   . Arthritis    right ankle-neuropathy  . Colon polyps   . Depression   . DM (diabetes mellitus),  type 2 ([place]) 12/2009   type 2  . Evalluate for OSA (obstructive sleep apnea) 08/15/2013   No OSA on sleep study but may be underestimated.-never used cpap    . GERD (gastroesophageal reflux disease)    not currently taking.  Marland Kitchen Heart murmur   . History of recurrent UTIs   . Hyperlipidemia   . Hypertension   . Hypothyroidism   . Liver disease   . Neuromuscular disorder ([place])    neropathy bilateral feet.  . 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 S/P right TK revision  12/16/2013  . Shortness of breath    with exertion   . Thyroid disease     Past Surgical History:  Procedure Laterality Date  . ADENOIDECTOMY    . BREAST SURGERY Right    lumpectomy-benign  . [place]   1 time  . CHOLECYSTECTOMY    . INNER EAR SURGERY     left ear had tubes put in and removed, scar tissue built up/ loss of hearing in left ear for over a year  . MASS EXCISION Left 12/16/2013   Procedure: EXCISION LEFT  DISTAL THIGH MASS;  Surgeon: Mauri Pole, MD;  Location: [facility];  Service: Orthopedics;  Laterality: Left;  . right ankle surgery      x 2- no retained hardware  . right rotator cuff surgery      left side  . TONSILLECTOMY    . TOTAL KNEE ARTHROPLASTY  2009   right  . TOTAL KNEE ARTHROPLASTY Left 05/05/2015   Procedure: LEFT TOTAL KNEE ARTHROPLASTY;  Surgeon: Paralee Cancel, MD;  Location[facility];  Service: Orthopedics;  Laterality: Left;  . TOTAL KNEE REVISION Right 12/16/2013   Procedure: RIGHT TOTAL KNEE REVISION POLY EXCHANGE;  Surgeon: Mauri Pole, MD;  [facility];  Service: Orthopedics;  Laterality: Right;  . TUBAL LIGATION  1987    No current facility-administered medications for this encounter.   Current Outpatient Medications  Medication Sig Dispense Refill Last Dose  . albuterol (PROVENTIL HFA;VENTOLIN HFA) 108 (90 Base) MCG/ACT inhaler Inhale 2 puffs into the lungs every 6 (six) hours as needed for wheezing or shortness of breath. 1 Inhaler 6   . diclofenac sodium (VOLTAREN) 1 % GEL Apply 1 application topically 2 (two) times daily as needed (pain).      Marland Kitchen gabapentin (NEURONTIN) 100 MG capsule Take 100 mg by mouth 2 (two) times daily.     Marland Kitchen tiZANidine (ZANAFLEX) 4 MG capsule Take 4 mg by mouth at bedtime.     . benazepril (LOTENSIN) 20 MG tablet Take 1 tablet (20 mg total) by mouth daily. 90 tablet 3   . blood glucose meter kit and supplies KIT Dispense based on patient and insurance preference. Use up to four times daily as  directed. DX E11.9 1 each 0   . buPROPion (WELLBUTRIN XL) 150 MG 24 hr tablet Take 1 tablet (150 mg total) by mouth daily. 90 tablet 3   . escitalopram (LEXAPRO) 20 MG tablet Take 1 tablet (20 mg total) by mouth daily. 90 tablet 3   . glimepiride (AMARYL) 4 MG tablet Take 1 tablet (4 mg total) by mouth daily before breakfast. 90 tablet 3   . glucose blood (ONETOUCH VERIO) test strip USE TO CHECK BLOOD SUGAR DAILY AND PRN 100 each 12   . levothyroxine (SYNTHROID) 88 MCG tablet Take 1 tablet (88 mcg total) by mouth daily. 90 tablet 3   . metFORMIN (  GLUCOPHAGE XR) 500 MG 24 hr tablet Take 2 tablets (1,000 mg total) by mouth in the morning and at bedtime. 360 tablet 3   . simvastatin (ZOCOR) 20 MG tablet Take 1 tablet (20 mg total) by mouth at bedtime. TAKE 1 TABLET BY MOUTH EVERYDAY AT BEDTIME 90 tablet 3    Allergies  Allergen Reactions  . Adhesive [Tape] Other (See Comments)    blisters  . Nitrofurantoin     Diffuse rash  . Other Other (See Comments)    (Neoprene)--causes blisters.     Social History   Tobacco Use  . Smoking status: Former Smoker    Packs/day: 1.50    Years: 30.00    Pack years: 45.00    Types: Cigarettes    Start date: 1969    Quit date: 02/21/2009    Years since quitting: 10.4  . Smokeless tobacco: Never Used  Substance Use Topics  . Alcohol use: Not Currently    Family History  Adopted: Yes     Review of Systems  Constitutional: Negative.   HENT: Negative.   Eyes: Negative.   Respiratory: Negative.   Cardiovascular: Negative.   Gastrointestinal: Positive for heartburn.  Genitourinary: Negative.   Musculoskeletal: Positive for joint pain.  Skin: Negative.   Neurological: Positive for headaches.  Endo/Heme/Allergies: Negative.   Psychiatric/Behavioral: Negative.          Objective:  Physical Exam  Constitutional: She is oriented to person, place, and time. She appears well-developed.  HENT:  Head: Normocephalic.  Eyes: Pupils are equal,  round, and reactive to light.  Neck: No JVD present. No tracheal deviation present. No thyromegaly present.  Cardiovascular: Normal rate and regular rhythm.  Murmur heard. Respiratory: Effort normal and breath sounds normal. No respiratory distress. She has no wheezes.  GI: Soft. There is no abdominal tenderness. There is no guarding.  Musculoskeletal:     Cervical back: Neck supple.     Left knee: Swelling and bony tenderness present. No erythema, ecchymosis or lacerations (healed previous incision). Decreased range of motion. Tenderness present.  Lymphadenopathy:    She has no cervical adenopathy.  Neurological: She is alert and oriented to person, place, and time. A sensory deficit (bilateral LE neuropathy) is present.  Skin: Skin is warm and dry.     Labs:  Estimated body mass index is 41.48 kg/m as calculated from the following:   Height as of 08/09/19: '5\' 6"'  (1.676 m).   Weight as of 08/09/19: 116.6 kg.  Imaging Review Plain radiographs demonstrate severe degenerative joint disease of the left knee. There is evidence of loosening of the tibial components. The bone quality appears to be good for age and reported activity level.     Assessment/Plan:  Left knee with failed previous arthroplasty.   The patient history, physical examination, clinical judgment of the provider and imaging studies are consistent with failure of the left knee(s), previous total knee arthroplasty. Revision total knee arthroplasty is deemed medically necessary. The treatment options including medical management, injection therapy, arthroscopy and revision arthroplasty were discussed at length. The risks and benefits of revision total knee arthroplasty were presented and reviewed. The risks due to aseptic loosening, infection, stiffness, patella tracking problems, thromboembolic complications and other imponderables were discussed. The patient acknowledged the explanation, agreed to proceed with the plan and  consent was signed. Patient is being admitted for treatment for surgery, pain control, PT, OT, prophylactic antibiotics, VTE prophylaxis, progressive ambulation and ADL's and discharge planning.The patient is planning to  be discharged home.    [name] Kendria Halberg   PA-C  08/12/2019, 8:25 AM

## 2019-08-12 NOTE — Progress Notes (Addendum)
Anesthesia Chart Review   Case: [id number] Date/Time: 08/15/19 0700   Procedure: TOTAL KNEE REVISION, LIKELY ISOLATED TIBIAL REVISION (Left Knee) - 2 hrs   Anesthesia type: Spinal   Pre-op diagnosis: Failed left knee, loose tibial component   [facility]   Surgeons: Paralee Cancel, MD      DISCUSSION:63 y.o. 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 Paralee Cancel.   Pt last seen by PCP, Dr. [name].  Per his note pt experiences shortness of breath with stairs, unable to achieve 4 METS.   Seen by cardiology in the past due to shortness of breath.  Stress test 07/09/2014: There was no ST segment deviation noted during stress.  Blood pressure demonstrated a hypertensive response to exercise.  Discussed with Dr. Sabra Heck, will request PCP clearance.    Addendum 08/14/2019:  Seen by cardiology today for evaluation of SOB.  Per Dr. Percival Spanish, "The patient has no high risk findings or features.  She has good exercise tolerance.  She is not going for high risk surgery.  He had previous negative work-up otherwise.  Years ago.  According to ACC/AHA guidelines no further testing is indicated.  He was seen in acceptable risk for the planned procedure."  Anticipate pt can proceed with planned procedure barring acute status change.   VS: BP 132/64   Pulse 71   Temp 36.6 C (Oral)   Resp 18   Ht '5\' 6"'  (1.676 m)   Wt 117 kg   SpO2 97%   BMI 41.64 kg/m   PROVIDERS: Marin Olp, MD is PCP   Minus Breeding, MD is Cardiologist  LABS: Labs reviewed: Acceptable for surgery. (all labs ordered are listed, but only abnormal results are displayed)  Labs Reviewed  GLUCOSE, CAPILLARY - Abnormal; Notable for the following components:       Result Value   Glucose-Capillary 176 (*)    All other components within normal limits  COMPREHENSIVE METABOLIC PANEL - Abnormal; Notable for the following components:   Glucose, Bld 172 (*)    Creatinine, Ser 1.09 (*)    GFR calc non Af Amer 54 (*)    All other components within normal limits  HEMOGLOBIN A1C - Abnormal; Notable for the following components:   Hgb A1c MFr Bld 6.7 (*)    All other components within normal limits  SURGICAL PCR SCREEN  CBC  TYPE AND SCREEN     IMAGES:   EKG: 08/07/2019 Rate 65 bpm Normal sinus rhythm   CV: Echo 12/15/2017 Study Conclusions   - Left ventricle: The cavity size was normal. Wall thickness was  normal. Systolic function was vigorous. The estimated ejection  fraction was in the range of 65% to 70%. Wall motion was normal;  there were no regional wall motion abnormalities. Left  ventricular diastolic function parameters were normal.  - Mitral valve: Mildly thickened leaflets . There was trivial  regurgitation.  - Left atrium: The atrium was normal in size.  - Atrial septum: No defect or patent foramen ovale was identified.  - Inferior vena cava: The IVC is small (<1.2 cm) and spontaneously  collapses, suggestive of volume depletion and a low RA pressure  of 3 mmHg.   Impressions:   - Essentially normal study. Small IVC suggests  volume depletion.  Past Medical History:  Diagnosis Date  . Acute meniscal tear of knee    Left knee  . Alcohol problem drinking    rehab  . Anemia    menstrual related  . Anxiety   . Arthritis    right ankle-neuropathy  . Colon polyps   . Depression   . DM (diabetes mellitus), type 2 ([place]) 12/2009   type 2  . Evalluate for OSA (obstructive sleep apnea) 08/15/2013   No OSA on sleep study but may be underestimated.-never used cpap    . GERD (gastroesophageal reflux disease)    not currently taking.  Marland Kitchen Heart murmur   . History of recurrent UTIs   . Hyperlipidemia   .  Hypertension   . Hypothyroidism   . Liver disease   . Neuromuscular disorder ([place])    neropathy bilateral feet.  . 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 Shortness of breath    with exertion   . Thyroid disease     Past Surgical History:  Procedure Laterality Date  . ADENOIDECTOMY    . BREAST SURGERY Right    lumpectomy-benign  . [place]   1 time  . CHOLECYSTECTOMY    . INNER EAR SURGERY     left ear had tubes put in and removed, scar tissue built up/ loss of hearing in left ear for over a year  . MASS EXCISION Left 12/16/2013   Procedure: EXCISION LEFT  DISTAL THIGH MASS;  Surgeon: Mauri Pole, MD;  Location[facility];  Service: Orthopedics;  Laterality: Left;  . right ankle surgery      x 2- no retained hardware  . right rotator cuff surgery      left side  . TONSILLECTOMY    . TOTAL KNEE ARTHROPLASTY  2009   right  . TOTAL KNEE ARTHROPLASTY Left 05/05/2015   Procedure: LEFT TOTAL KNEE ARTHROPLASTY;  Surgeon: Paralee Cancel, MD;  [facility];  Service: Orthopedics;  Laterality: Left;  . TOTAL KNEE REVISION Right 12/16/2013   Procedure: RIGHT TOTAL KNEE REVISION POLY EXCHANGE;  Surgeon: Mauri Pole, MD;  [facility];  Service: Orthopedics;  Laterality: Right;  . TUBAL LIGATION  1987    MEDICATIONS: . albuterol (PROVENTIL HFA;VENTOLIN HFA) 108 (90 Base) MCG/ACT inhaler  . benazepril (LOTENSIN) 20 MG tablet  . blood glucose meter kit and supplies KIT  . buPROPion (WELLBUTRIN XL) 150 MG 24 hr tablet  . diclofenac sodium (VOLTAREN) 1 % GEL  . escitalopram (LEXAPRO) 20 MG tablet  . gabapentin (NEURONTIN) 100 MG capsule  . glimepiride (AMARYL) 4 MG tablet  . glucose blood (ONETOUCH VERIO) test strip  . levothyroxine (SYNTHROID) 88 MCG tablet  . metFORMIN (GLUCOPHAGE XR) 500 MG 24 hr tablet  . simvastatin (ZOCOR) 20 MG tablet  . tiZANidine (ZANAFLEX) 4 MG capsule   No current facility-administered  medications for this encounter.    Maia Plan [facility] [phone] 08/12/19  2:50 PM

## 2019-08-12 NOTE — Telephone Encounter (Signed)
Ok to fill out from 08/09/2019 office note

## 2019-08-13 ENCOUNTER — Encounter: Payer: Self-pay | Admitting: Cardiology

## 2019-08-13 ENCOUNTER — Other Ambulatory Visit: Payer: Self-pay

## 2019-08-13 DIAGNOSIS — Z01810 Encounter for preprocedural cardiovascular examination: Secondary | ICD-10-CM | POA: Insufficient documentation

## 2019-08-13 DIAGNOSIS — R0602 Shortness of breath: Secondary | ICD-10-CM

## 2019-08-13 NOTE — Progress Notes (Signed)
re

## 2019-08-13 NOTE — Progress Notes (Deleted)
Cardiology Office Note   Date:  08/13/2019   ID:  [name] [name], DOB 1955/09/12, MRN [id number]  PCP:  Marin Olp, MD  Cardiologist:   No primary care provider on file. Referring:  ***  No chief complaint on file.     History of Present Illness: [name] [name] is a 64 y.o. female who is referred by *** for evaluation of SOB and preop evaluatio prior to ***   Echo in 2019 demonstrated NL EF.   ***    Past Medical History:  Diagnosis Date  . Acute meniscal tear of knee    Left knee  . Alcohol problem drinking    rehab  . Anemia    menstrual related  . Anxiety   . Arthritis    right ankle-neuropathy  . Colon polyps   . Depression   . DM (diabetes mellitus), type 2 ([place]) 12/2009   type 2  . Evalluate for OSA (obstructive sleep apnea) 08/15/2013   No OSA on sleep study but may be underestimated.-never used cpap    . GERD (gastroesophageal reflux disease)    not currently taking.  Marland Kitchen Heart murmur   . History of recurrent UTIs   . Hyperlipidemia   . Hypertension   . Hypothyroidism   . Liver disease   . Neuromuscular disorder ([place])    neropathy bilateral feet.  . 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 Shortness of breath    with exertion   . Thyroid disease     Past Surgical History:  Procedure Laterality Date  . ADENOIDECTOMY    . BREAST SURGERY Right    lumpectomy-benign  . [place]   1 time  . CHOLECYSTECTOMY    . INNER EAR SURGERY     left ear had tubes put in and removed, scar tissue built up/ loss of hearing in left ear for over a year  . MASS EXCISION Left 12/16/2013   Procedure: EXCISION LEFT  DISTAL THIGH MASS;  Surgeon: Mauri Pole, MD;  [facility];  Service: Orthopedics;  Laterality: Left;  . right ankle surgery      x 2- no retained hardware  . right rotator cuff surgery      left side  . TONSILLECTOMY    . TOTAL KNEE ARTHROPLASTY  2009   right  . TOTAL KNEE  ARTHROPLASTY Left 05/05/2015   Procedure: LEFT TOTAL KNEE ARTHROPLASTY;  Surgeon: Paralee Cancel, MD;  [facility];  Service: Orthopedics;  Laterality: Left;  . TOTAL KNEE REVISION Right 12/16/2013   Procedure: RIGHT TOTAL KNEE REVISION POLY EXCHANGE;  Surgeon: Mauri Pole, MD;  [facility];  Service: Orthopedics;  Laterality: Right;  . TUBAL LIGATION  1987     Current Outpatient Medications  Medication Sig Dispense Refill  . albuterol (PROVENTIL HFA;VENTOLIN HFA) 108 (90 Base) MCG/ACT inhaler Inhale 2 puffs into the lungs every 6 (six) hours as needed for wheezing or shortness of breath. 1 Inhaler 6  . benazepril (LOTENSIN) 20 MG tablet Take 1 tablet (20 mg total) by mouth daily. 90 tablet 3  . blood glucose meter kit and supplies KIT Dispense based on patient and insurance preference. Use up to four times daily as directed. DX E11.9 1 each 0  . buPROPion (WELLBUTRIN XL) 150 MG 24 hr tablet Take 1 tablet (150 mg total) by mouth daily. 90 tablet 3  . diclofenac sodium (VOLTAREN) 1 % GEL  Apply 1 application topically 2 (two) times daily as needed (pain).     Marland Kitchen escitalopram (LEXAPRO) 20 MG tablet Take 1 tablet (20 mg total) by mouth daily. 90 tablet 3  . gabapentin (NEURONTIN) 100 MG capsule Take 100 mg by mouth 2 (two) times daily.    Marland Kitchen glimepiride (AMARYL) 4 MG tablet Take 1 tablet (4 mg total) by mouth daily before breakfast. 90 tablet 3  . glucose blood (ONETOUCH VERIO) test strip USE TO CHECK BLOOD SUGAR DAILY AND PRN 100 each 12  . levothyroxine (SYNTHROID) 88 MCG tablet Take 1 tablet (88 mcg total) by mouth daily. 90 tablet 3  . metFORMIN (GLUCOPHAGE XR) 500 MG 24 hr tablet Take 2 tablets (1,000 mg total) by mouth in the morning and at bedtime. 360 tablet 3  . simvastatin (ZOCOR) 20 MG tablet Take 1 tablet (20 mg total) by mouth at bedtime. TAKE 1 TABLET BY MOUTH EVERYDAY AT BEDTIME 90 tablet 3  . tiZANidine (ZANAFLEX) 4 MG capsule Take 4 mg by mouth at bedtime.     No current  facility-administered medications for this visit.    Allergies:   Adhesive [tape], Nitrofurantoin, and Other    ROS:  Please see the history of present illness.   Otherwise, review of systems are positive for {NONE DEFAULTED:18576::"none"}.   All other systems are reviewed and negative.    PHYSICAL EXAM: VS:  There were no vitals taken for this visit. , BMI There is no height or weight on file to calculate BMI. GENERAL:  Well appearing NECK:  No jugular venous distention, waveform within normal limits, carotid upstroke brisk and symmetric, no bruits, no thyromegaly LUNGS:  Clear to auscultation bilaterally CHEST:  Unremarkable HEART:  PMI not displaced or sustained,S1 and S2 within normal limits, no S3, no S4, no clicks, no rubs, *** murmurs ABD:  Flat, positive bowel sounds normal in frequency in pitch, no bruits, no rebound, no guarding, no midline pulsatile mass, no hepatomegaly, no splenomegaly EXT:  2 plus pulses throughout, no edema, no cyanosis no clubbing    ***GENERAL:  Well appearing HEENT:  Pupils equal round and reactive, fundi not visualized, oral mucosa unremarkable NECK:  No jugular venous distention, waveform within normal limits, carotid upstroke brisk and symmetric, no bruits, no thyromegaly LYMPHATICS:  No cervical, inguinal adenopathy LUNGS:  Clear to auscultation bilaterally BACK:  No CVA tenderness CHEST:  Unremarkable HEART:  PMI not displaced or sustained,S1 and S2 within normal limits, no S3, no S4, no clicks, no rubs, *** murmurs ABD:  Flat, positive bowel sounds normal in frequency in pitch, no bruits, no rebound, no guarding, no midline pulsatile mass, no hepatomegaly, no splenomegaly EXT:  2 plus pulses throughout, no edema, no cyanosis no clubbing SKIN:  No rashes no nodules NEURO:  Cranial nerves II through XII grossly intact, motor grossly intact throughout PSYCH:  Cognitively intact, oriented to person place and time    EKG:  EKG {ACTION; IS/IS  XNT:[id number]} ordered today. The ekg ordered today demonstrates ***   Recent Labs: 01/25/2019: TSH 1.40 08/07/2019: ALT 20; BUN 21; Creatinine, Ser 1.09; Hemoglobin 14.3; Platelets 205; Potassium 4.3; Sodium 140    Lipid Panel    Component Value Date/Time   CHOL 120 01/25/2019 1602   TRIG 56 01/25/2019 1602   HDL 60 01/25/2019 1602   CHOLHDL 2.0 01/25/2019 1602   VLDL 12.2 05/17/2016 1305   LDLCALC 46 01/25/2019 1602   LDLDIRECT 158.6 02/26/2014 1026      Wt  Readings from Last 3 Encounters:  08/09/19 257 lb (116.6 kg)  08/07/19 258 lb (117 kg)  02/08/19 245 lb 6.4 oz (111.3 kg)      Other studies Reviewed: Additional studies/ records that were reviewed today include: ***. Review of the above records demonstrates:  Please see elsewhere in the note.  ***   ASSESSMENT AND PLAN:  SOB:  ***  PREOP CLEARANCE:  ***   COVID EDUCATION:  ***   Current medicines are reviewed at length with the patient today.  The patient {ACTIONS; HAS/DOES NOT HAVE:19233} concerns regarding medicines.  The following changes have been made:  {PLAN; NO CHANGE:13088:s}  Labs/ tests ordered today include: *** No orders of the defined types were placed in this encounter.    Disposition:   FU with ***    Signed, Minus Breeding, MD  08/13/2019 7:50 PM     [facility]

## 2019-08-14 ENCOUNTER — Ambulatory Visit (INDEPENDENT_AMBULATORY_CARE_PROVIDER_SITE_OTHER): Payer: Medicare Other | Admitting: Cardiology

## 2019-08-14 ENCOUNTER — Other Ambulatory Visit: Payer: Self-pay

## 2019-08-14 ENCOUNTER — Encounter: Payer: Self-pay | Admitting: Cardiology

## 2019-08-14 ENCOUNTER — Encounter (INDEPENDENT_AMBULATORY_CARE_PROVIDER_SITE_OTHER): Payer: Self-pay

## 2019-08-14 VITALS — BP 140/77 | HR 71 | Temp 97.3°F | Ht 66.0 in | Wt 259.6 lb

## 2019-08-14 DIAGNOSIS — Z01810 Encounter for preprocedural cardiovascular examination: Secondary | ICD-10-CM | POA: Diagnosis not present

## 2019-08-14 DIAGNOSIS — R011 Cardiac murmur, unspecified: Secondary | ICD-10-CM

## 2019-08-14 DIAGNOSIS — Z7189 Other specified counseling: Secondary | ICD-10-CM | POA: Diagnosis not present

## 2019-08-14 DIAGNOSIS — R0602 Shortness of breath: Secondary | ICD-10-CM | POA: Diagnosis not present

## 2019-08-14 NOTE — Anesthesia Preprocedure Evaluation (Addendum)
Anesthesia Evaluation  Patient identified by MRN, date of birth, ID band Patient awake    Reviewed: Allergy & Precautions, NPO status , Patient's Chart, lab work & pertinent test results  Airway Mallampati: II  TM Distance: >3 FB Neck ROM: Full    Dental  (+) Dental Advisory Given, Teeth Intact   Pulmonary shortness of breath, sleep apnea , former smoker,    Pulmonary exam normal breath sounds clear to auscultation       Cardiovascular hypertension, Pt. on medications Normal cardiovascular exam Rhythm:Regular Rate:Normal     Neuro/Psych  Headaches, PSYCHIATRIC DISORDERS Anxiety Depression  Neuromuscular disease    GI/Hepatic Neg liver ROS, GERD  ,  Endo/Other  diabetesHypothyroidism Morbid obesity  Renal/GU negative Renal ROS     Musculoskeletal  (+) Arthritis ,   Abdominal (+) + obese,   Peds  Hematology negative hematology ROS (+) anemia ,   Anesthesia Other Findings   Reproductive/Obstetrics                          Anesthesia Physical Anesthesia Plan  ASA: III  Anesthesia Plan: General   Post-op Pain Management: GA combined w/ Regional for post-op pain   Induction: Intravenous  PONV Risk Score and Plan: 3 and Propofol infusion, TIVA, Treatment may vary due to age or medical condition, Midazolam and Ondansetron  Airway Management Planned: Oral ETT  Additional Equipment: None  Intra-op Plan:   Post-operative Plan: Extubation in OR  Informed Consent: I have reviewed the patients History and Physical, chart, labs and discussed the procedure including the risks, benefits and alternatives for the proposed anesthesia with the patient or authorized representative who has indicated his/her understanding and acceptance.     Dental advisory given  Plan Discussed with: CRNA  Anesthesia Plan Comments: (See PAT note 08/07/2019, Konrad Felix, PA-C)     Anesthesia Quick  Evaluation

## 2019-08-14 NOTE — Progress Notes (Signed)
Cardiology Office Note   Date:  08/14/2019   ID:  [name] [name], DOB 09/14/1955, MRN [id number]  PCP:  Marin Olp, MD  Cardiologist:   No primary care provider on file. Referring:  Marin Olp, MD  Chief Complaint  Patient presents with  . Shortness of Breath      History of Present Illness: [name] [name] is a 64 y.o. female who is referred by Marin Olp, MD for evaluation of SOB and preop evaluation prior to knee surgery   The patient has a previous history of diabetes.  She saw Dr. Claiborne Billings in 2016.  She apparently was seen preoperatively at that time as well.  I was able to retrieve.  Treadmill stress test which was negative.  She did have some shortness of breath at that time.  She had some mild aortic stenosis recorded previously but not documented in follow-up.  Echo in 2019 demonstrated NL EF.     She did have another knee surgery.  She is fairly limited in her activities but does do some routine activities such as shopping.  She carries in groceries.  She does have some chronic mild dyspnea this is baseline and unchanged from previous.  She has no resting shortness of breath, PND or orthopnea.  She has no chest pressure, neck or arm discomfort.     Past Medical History:  Diagnosis Date  . Acute meniscal tear of knee    Left knee  . Alcohol problem drinking    rehab  . Anemia    menstrual related  . Anxiety   . Arthritis    right ankle-neuropathy  . Colon polyps   . Depression   . DM (diabetes mellitus), type 2 ([place]) 12/2009   type 2  . Evalluate for OSA (obstructive sleep apnea) 08/15/2013   No OSA on sleep study but may be underestimated.-never used cpap    . GERD (gastroesophageal reflux disease)    not currently taking.  Marland Kitchen Hyperlipidemia   . Hypertension   . Hypothyroidism   . Liver disease   . Neuromuscular disorder ([place])    neropathy bilateral feet.  . Thyroid disease     Past Surgical History:  Procedure Laterality Date  .  ADENOIDECTOMY    . BREAST SURGERY Right    lumpectomy-benign  . [place]   1 time  . CHOLECYSTECTOMY    . INNER EAR SURGERY     left ear had tubes put in and removed, scar tissue built up/ loss of hearing in left ear for over a year  . MASS EXCISION Left 12/16/2013   Procedure: EXCISION LEFT  DISTAL THIGH MASS;  Surgeon: Mauri Pole, MD;  Location: [facility];  Service: Orthopedics;  Laterality: Left;  . right ankle surgery      x 2- no retained hardware  . right rotator cuff surgery      left side  . TONSILLECTOMY    . TOTAL KNEE ARTHROPLASTY  2009   right  . TOTAL KNEE ARTHROPLASTY Left 05/05/2015   Procedure: LEFT TOTAL KNEE ARTHROPLASTY;  Surgeon: Paralee Cancel, MD;  Location: [facility];  Service: Orthopedics;  Laterality: Left;  . TOTAL KNEE REVISION Right 12/16/2013   Procedure: RIGHT TOTAL KNEE REVISION POLY EXCHANGE;  Surgeon: Mauri Pole, MD;  Location: [facility];  Service: Orthopedics;  Laterality: Right;  . TUBAL LIGATION  1987     Current Outpatient Medications  Medication Sig Dispense Refill  . albuterol (  PROVENTIL HFA;VENTOLIN HFA) 108 (90 Base) MCG/ACT inhaler Inhale 2 puffs into the lungs every 6 (six) hours as needed for wheezing or shortness of breath. 1 Inhaler 6  . benazepril (LOTENSIN) 20 MG tablet Take 1 tablet (20 mg total) by mouth daily. 90 tablet 3  . blood glucose meter kit and supplies KIT Dispense based on patient and insurance preference. Use up to four times daily as directed. DX E11.9 1 each 0  . buPROPion (WELLBUTRIN XL) 150 MG 24 hr tablet Take 1 tablet (150 mg total) by mouth daily. 90 tablet 3  . diclofenac sodium (VOLTAREN) 1 % GEL Apply 1 application topically 2 (two) times daily as needed (pain).     Marland Kitchen escitalopram (LEXAPRO) 20 MG tablet Take 1 tablet (20 mg total) by mouth daily. 90 tablet 3  . gabapentin (NEURONTIN) 100 MG capsule Take 100 mg by mouth 2 (two) times daily.    Marland Kitchen glimepiride (AMARYL) 4 MG tablet Take 1 tablet (4 mg  total) by mouth daily before breakfast. 90 tablet 3  . glucose blood (ONETOUCH VERIO) test strip USE TO CHECK BLOOD SUGAR DAILY AND PRN 100 each 12  . levothyroxine (SYNTHROID) 88 MCG tablet Take 1 tablet (88 mcg total) by mouth daily. 90 tablet 3  . metFORMIN (GLUCOPHAGE XR) 500 MG 24 hr tablet Take 2 tablets (1,000 mg total) by mouth in the morning and at bedtime. 360 tablet 3  . simvastatin (ZOCOR) 20 MG tablet Take 1 tablet (20 mg total) by mouth at bedtime. TAKE 1 TABLET BY MOUTH EVERYDAY AT BEDTIME 90 tablet 3  . tiZANidine (ZANAFLEX) 4 MG capsule Take 4 mg by mouth at bedtime.     No current facility-administered medications for this visit.    Allergies:   Adhesive [tape], Nitrofurantoin, and Other    ROS:  Please see the history of present illness.   Otherwise, review of systems are positive for none.   All other systems are reviewed and negative.    PHYSICAL EXAM: VS:  BP 140/77   Pulse 71   Temp (!) 97.3 F (36.3 C)   Ht '5\' 6"'  (1.676 m)   Wt 259 lb 9.6 oz (117.8 kg)   SpO2 99%   BMI 41.90 kg/m  , BMI Body mass index is 41.9 kg/m. GENERAL:  Well appearing HEENT:  Pupils equal round and reactive, fundi not visualized, oral mucosa unremarkable NECK:  No jugular venous distention, waveform within normal limits, carotid upstroke brisk and symmetric, no bruits, no thyromegaly LYMPHATICS:  No cervical, inguinal adenopathy LUNGS:  Clear to auscultation bilaterally BACK:  No CVA tenderness CHEST:  Unremarkable HEART:  PMI not displaced or sustained,S1 and S2 within normal limits, no S3, no S4, no clicks, no rubs, 2 out of 6 systolic murmur heard best at the right upper sternal border and radiating slightly into the axilla and early peaking, no diastolic murmurs ABD:  Flat, positive bowel sounds normal in frequency in pitch, no bruits, no rebound, no guarding, no midline pulsatile mass, no hepatomegaly, no splenomegaly EXT:  2 plus pulses throughout, no edema, no cyanosis no  clubbing SKIN:  No rashes no nodules NEURO:  Cranial nerves II through XII grossly intact, motor grossly intact throughout PSYCH:  Cognitively intact, oriented to person place and time  EKG:  EKG is not ordered today. The ekg ordered 08/07/2019 demonstrates sinus rhythm, rate 65, axis within normal limits, intervals within normal limits, no acute ST-T wave changes.   Recent Labs: 01/25/2019: TSH  1.40 08/07/2019: ALT 20; BUN 21; Creatinine, Ser 1.09; Hemoglobin 14.3; Platelets 205; Potassium 4.3; Sodium 140    Lipid Panel    Component Value Date/Time   CHOL 120 01/25/2019 1602   TRIG 56 01/25/2019 1602   HDL 60 01/25/2019 1602   CHOLHDL 2.0 01/25/2019 1602   VLDL 12.2 05/17/2016 1305   LDLCALC 46 01/25/2019 1602   LDLDIRECT 158.6 02/26/2014 1026      Wt Readings from Last 3 Encounters:  08/14/19 259 lb 9.6 oz (117.8 kg)  08/09/19 257 lb (116.6 kg)  08/07/19 258 lb (117 kg)      Other studies Reviewed: Additional studies/ records that were reviewed today include: Stress test, echo, distant cardiology notes. Review of the above records demonstrates:  Please see elsewhere in the note.     ASSESSMENT AND PLAN:  SOB: The patient shortness of breath is baseline.  She has no high risk findings or features.  She had a work-up before for the same symptoms.  We will concentrate on weight loss.  I do not think further cardiovascular testing is indicated other than as below for the future.  MURMUR: She had some mild aortic sclerosis previously and I would suspect this is probably mild aortic stenosis now.  I will repeat an echo in 1 year.  No further testing is indicated.  PREOP CLEARANCE: The patient has no high risk findings or features.  She has good exercise tolerance.  She is not going for high risk surgery.  He had previous negative work-up otherwise.  Years ago.  According to ACC/AHA guidelines no further testing is indicated.  He was seen in acceptable risk for the planned  procedure.  DM: Her A1c is usually in the fives.  Most recently was 6.7.  I will defer to Marin Olp, MD   DYSLIPIDEMIA: Her lipids are excellent.  LDL was 46 with an HDL of 60.  No change in therapy.  COVID EDUCATION: She has been vaccinated.   Current medicines are reviewed at length with the patient today.  The patient does not have concerns regarding medicines.  The following changes have been made:  no change  Labs/ tests ordered today include:   Orders Placed This Encounter  Procedures  . ECHOCARDIOGRAM COMPLETE     Disposition:   FU with me in one year after echo.     Signed, Minus Breeding, MD  08/14/2019 8:23 PM    [place]

## 2019-08-14 NOTE — Patient Instructions (Signed)
Medication Instructions:  Your physician recommends that you continue on your current medications as directed. Please refer to the Current Medication list given to you today.  *If you need a refill on your cardiac medications before your next appointment, please call your pharmacy*  Lab Work: NONE  Testing/Procedures: Your physician has requested that you have an echocardiogram. Echocardiography is a painless test that uses sound waves to create images of your heart. It provides your doctor with information about the size and shape of your heart and how well your heart's chambers and valves are working. This procedure takes approximately one hour. There are no restrictions for this procedure. June 2022   Follow-Up: At [facility], you and your health needs are our priority.  As part of our continuing mission to provide you with exceptional heart care, we have created designated Provider Care Teams.  These Care Teams include your primary Cardiologist (physician) and Advanced Practice Providers (APPs -  Physician Assistants and Nurse Practitioners) who all work together to provide you with the care you need, when you need it.  We recommend signing up for the patient portal called "MyChart".  Sign up information is provided on this After Visit Summary.  MyChart is used to connect with patients for Virtual Visits (Telemedicine).  Patients are able to view lab/test results, encounter notes, upcoming appointments, etc.  Non-urgent messages can be sent to your provider as well.   To learn more about what you can do with MyChart, go to [email or web address].    Your next appointment:   12 month(s) ABOUT 1 WEEK AFTER YOUR ECHO  You will receive a reminder letter in the mail two months in advance. If you don't receive a letter, please call our office to schedule the follow-up appointment.   The format for your next appointment:   In Person  Provider:   You may see DR [facility]  or one of the  following Advanced Practice Providers on your designated Care Team:    Rosaria Ferries, PA-C  Jory Sims, DNP, ANP  Cadence Kathlen Mody, NP  Other Instructions   YOU ARE [place]

## 2019-08-15 ENCOUNTER — Encounter (HOSPITAL_COMMUNITY)
Admission: RE | Disposition: A | Discharge status: Home / Self Care | Payer: Self-pay | Source: Home / Self Care | Attending: Orthopedic Surgery

## 2019-08-15 ENCOUNTER — Inpatient Hospital Stay (HOSPITAL_COMMUNITY)
Admission: RE | Admit: 2019-08-15 | Discharge: 2019-08-17 | DRG: 467 | Disposition: A | Discharge status: Home / Self Care | Payer: Medicare Other | Attending: Orthopedic Surgery | Admitting: Orthopedic Surgery

## 2019-08-15 ENCOUNTER — Encounter (HOSPITAL_COMMUNITY): Payer: Self-pay | Admitting: Orthopedic Surgery

## 2019-08-15 ENCOUNTER — Ambulatory Visit (HOSPITAL_COMMUNITY): Payer: Medicare Other | Admitting: Anesthesiology

## 2019-08-15 ENCOUNTER — Ambulatory Visit (HOSPITAL_COMMUNITY): Payer: Medicare Other | Admitting: Physician Assistant

## 2019-08-15 DIAGNOSIS — G4730 Sleep apnea, unspecified: Secondary | ICD-10-CM | POA: Diagnosis present

## 2019-08-15 DIAGNOSIS — Y831 Surgical operation with implant of artificial internal device as the cause of abnormal reaction of the patient, or of later complication, without mention of misadventure at the time of the procedure: Secondary | ICD-10-CM | POA: Diagnosis present

## 2019-08-15 DIAGNOSIS — F329 Major depressive disorder, single episode, unspecified: Secondary | ICD-10-CM | POA: Diagnosis present

## 2019-08-15 DIAGNOSIS — F419 Anxiety disorder, unspecified: Secondary | ICD-10-CM | POA: Diagnosis present

## 2019-08-15 DIAGNOSIS — E1169 Type 2 diabetes mellitus with other specified complication: Secondary | ICD-10-CM | POA: Diagnosis present

## 2019-08-15 DIAGNOSIS — Z96651 Presence of right artificial knee joint: Secondary | ICD-10-CM | POA: Diagnosis present

## 2019-08-15 DIAGNOSIS — K219 Gastro-esophageal reflux disease without esophagitis: Secondary | ICD-10-CM | POA: Diagnosis present

## 2019-08-15 DIAGNOSIS — Z6841 Body Mass Index (BMI) 40.0 and over, adult: Secondary | ICD-10-CM

## 2019-08-15 DIAGNOSIS — Z7984 Long term (current) use of oral hypoglycemic drugs: Secondary | ICD-10-CM

## 2019-08-15 DIAGNOSIS — E785 Hyperlipidemia, unspecified: Secondary | ICD-10-CM | POA: Diagnosis present

## 2019-08-15 DIAGNOSIS — I152 Hypertension secondary to endocrine disorders: Secondary | ICD-10-CM | POA: Diagnosis present

## 2019-08-15 DIAGNOSIS — E039 Hypothyroidism, unspecified: Secondary | ICD-10-CM | POA: Diagnosis present

## 2019-08-15 DIAGNOSIS — Z96652 Presence of left artificial knee joint: Secondary | ICD-10-CM

## 2019-08-15 DIAGNOSIS — Z87891 Personal history of nicotine dependence: Secondary | ICD-10-CM

## 2019-08-15 DIAGNOSIS — Z79899 Other long term (current) drug therapy: Secondary | ICD-10-CM

## 2019-08-15 DIAGNOSIS — T84033A Mechanical loosening of internal left knee prosthetic joint, initial encounter: Principal | ICD-10-CM | POA: Diagnosis present

## 2019-08-15 HISTORY — PX: TOTAL KNEE REVISION: SHX996

## 2019-08-15 LAB — TYPE AND SCREEN
ABO/RH(D): A POS
Antibody Screen: NEGATIVE

## 2019-08-15 LAB — GLUCOSE, CAPILLARY
Glucose-Capillary: 131 mg/dL — ABNORMAL HIGH (ref 70–99)
Glucose-Capillary: 213 mg/dL — ABNORMAL HIGH (ref 70–99)
Glucose-Capillary: 336 mg/dL — ABNORMAL HIGH (ref 70–99)
Glucose-Capillary: 367 mg/dL — ABNORMAL HIGH (ref 70–99)

## 2019-08-15 SURGERY — TOTAL KNEE REVISION
Anesthesia: Spinal | Site: Knee | Laterality: Left

## 2019-08-15 MED ORDER — LACTATED RINGERS IV SOLN: INTRAVENOUS | Status: DC | PRN

## 2019-08-15 MED ORDER — CELECOXIB 200 MG PO CAPS
200.0000 mg | ORAL_CAPSULE | Freq: Two times a day (BID) | ORAL | Status: DC
  Administered 2019-08-15 – 2019-08-17 (×4): 200 mg via ORAL
  Filled 2019-08-15 (×4): qty 1

## 2019-08-15 MED ORDER — DEXAMETHASONE SODIUM PHOSPHATE 10 MG/ML IJ SOLN
10.0000 mg | Freq: Once | INTRAMUSCULAR | Status: AC
  Administered 2019-08-15: 10 mg via INTRAVENOUS

## 2019-08-15 MED ORDER — METHOCARBAMOL 500 MG IVPB - SIMPLE MED
INTRAVENOUS | Status: AC
  Filled 2019-08-15: qty 50

## 2019-08-15 MED ORDER — KETOROLAC TROMETHAMINE 30 MG/ML IJ SOLN
INTRAMUSCULAR | Status: AC
  Filled 2019-08-15: qty 1

## 2019-08-15 MED ORDER — POLYETHYLENE GLYCOL 3350 17 G PO PACK
17.0000 g | PACK | Freq: Two times a day (BID) | ORAL | Status: DC
  Administered 2019-08-15 – 2019-08-17 (×4): 17 g via ORAL
  Filled 2019-08-15 (×4): qty 1

## 2019-08-15 MED ORDER — SODIUM CHLORIDE (PF) 0.9 % IJ SOLN
INTRAMUSCULAR | Status: DC | PRN
  Administered 2019-08-15: 30 mL

## 2019-08-15 MED ORDER — FENTANYL CITRATE (PF) 100 MCG/2ML IJ SOLN
INTRAMUSCULAR | Status: AC
  Filled 2019-08-15: qty 4

## 2019-08-15 MED ORDER — ACETAMINOPHEN 325 MG PO TABS: 325.0000 mg | ORAL_TABLET | Freq: Four times a day (QID) | ORAL | Status: DC | PRN

## 2019-08-15 MED ORDER — ESCITALOPRAM OXALATE 20 MG PO TABS
20.0000 mg | ORAL_TABLET | Freq: Every day | ORAL | Status: DC
  Administered 2019-08-16 – 2019-08-17 (×2): 20 mg via ORAL
  Filled 2019-08-15 (×2): qty 1

## 2019-08-15 MED ORDER — BISACODYL 10 MG RE SUPP: 10.0000 mg | Freq: Every day | RECTAL | Status: DC | PRN

## 2019-08-15 MED ORDER — METFORMIN HCL ER 500 MG PO TB24
1000.0000 mg | ORAL_TABLET | Freq: Two times a day (BID) | ORAL | Status: DC
  Administered 2019-08-16 – 2019-08-17 (×3): 1000 mg via ORAL
  Filled 2019-08-15 (×3): qty 2

## 2019-08-15 MED ORDER — STERILE WATER FOR IRRIGATION IR SOLN
Status: DC | PRN
  Administered 2019-08-15: 2000 mL

## 2019-08-15 MED ORDER — ONDANSETRON HCL 4 MG/2ML IJ SOLN
INTRAMUSCULAR | Status: AC
  Filled 2019-08-15: qty 2

## 2019-08-15 MED ORDER — METHOCARBAMOL 500 MG IVPB - SIMPLE MED
500.0000 mg | Freq: Four times a day (QID) | INTRAVENOUS | Status: DC | PRN
  Administered 2019-08-15: 500 mg via INTRAVENOUS
  Filled 2019-08-15: qty 50

## 2019-08-15 MED ORDER — KETAMINE HCL 10 MG/ML IJ SOLN
INTRAMUSCULAR | Status: AC
  Filled 2019-08-15: qty 1

## 2019-08-15 MED ORDER — FENTANYL CITRATE (PF) 100 MCG/2ML IJ SOLN
INTRAMUSCULAR | Status: AC
  Filled 2019-08-15: qty 2

## 2019-08-15 MED ORDER — ASPIRIN 81 MG PO CHEW
81.0000 mg | CHEWABLE_TABLET | Freq: Two times a day (BID) | ORAL | Status: DC
  Administered 2019-08-15 – 2019-08-17 (×4): 81 mg via ORAL
  Filled 2019-08-15 (×4): qty 1

## 2019-08-15 MED ORDER — SODIUM CHLORIDE (PF) 0.9 % IJ SOLN
INTRAMUSCULAR | Status: AC
  Filled 2019-08-15: qty 10

## 2019-08-15 MED ORDER — MIDAZOLAM HCL 2 MG/2ML IJ SOLN
INTRAMUSCULAR | Status: DC | PRN
  Administered 2019-08-15: 2 mg via INTRAVENOUS

## 2019-08-15 MED ORDER — SODIUM CHLORIDE (PF) 0.9 % IJ SOLN
INTRAMUSCULAR | Status: AC
  Filled 2019-08-15: qty 50

## 2019-08-15 MED ORDER — ROCURONIUM BROMIDE 10 MG/ML (PF) SYRINGE: PREFILLED_SYRINGE | INTRAVENOUS | Status: DC | PRN

## 2019-08-15 MED ORDER — LEVOTHYROXINE SODIUM 88 MCG PO TABS
88.0000 ug | ORAL_TABLET | Freq: Every day | ORAL | Status: DC
  Administered 2019-08-16 – 2019-08-17 (×2): 88 ug via ORAL
  Filled 2019-08-15 (×2): qty 1

## 2019-08-15 MED ORDER — FENTANYL CITRATE (PF) 250 MCG/5ML IJ SOLN
INTRAMUSCULAR | Status: AC
  Filled 2019-08-15: qty 5

## 2019-08-15 MED ORDER — CEFAZOLIN SODIUM-DEXTROSE 2-4 GM/100ML-% IV SOLN
2.0000 g | Freq: Four times a day (QID) | INTRAVENOUS | Status: AC
  Administered 2019-08-15 (×2): 2 g via INTRAVENOUS
  Filled 2019-08-15 (×2): qty 100

## 2019-08-15 MED ORDER — PROPOFOL 500 MG/50ML IV EMUL
INTRAVENOUS | Status: AC
  Filled 2019-08-15: qty 50

## 2019-08-15 MED ORDER — LIDOCAINE 2% (20 MG/ML) 5 ML SYRINGE
INTRAMUSCULAR | Status: AC
  Filled 2019-08-15: qty 5

## 2019-08-15 MED ORDER — DEXAMETHASONE SODIUM PHOSPHATE 10 MG/ML IJ SOLN
10.0000 mg | Freq: Once | INTRAMUSCULAR | Status: AC
  Administered 2019-08-16: 10 mg via INTRAVENOUS
  Filled 2019-08-15: qty 1

## 2019-08-15 MED ORDER — TRANEXAMIC ACID-NACL 1000-0.7 MG/100ML-% IV SOLN
1000.0000 mg | INTRAVENOUS | Status: AC
  Administered 2019-08-15: 1000 mg via INTRAVENOUS

## 2019-08-15 MED ORDER — BUPIVACAINE-EPINEPHRINE (PF) 0.5% -1:200000 IJ SOLN: INTRAMUSCULAR | Status: DC | PRN

## 2019-08-15 MED ORDER — BUPIVACAINE HCL (PF) 0.25 % IJ SOLN
INTRAMUSCULAR | Status: DC | PRN
  Administered 2019-08-15: 30 mL

## 2019-08-15 MED ORDER — METHOCARBAMOL 500 MG PO TABS
500.0000 mg | ORAL_TABLET | Freq: Four times a day (QID) | ORAL | Status: DC | PRN
  Administered 2019-08-16 – 2019-08-17 (×5): 500 mg via ORAL
  Filled 2019-08-15 (×5): qty 1

## 2019-08-15 MED ORDER — FERROUS SULFATE 325 (65 FE) MG PO TABS
325.0000 mg | ORAL_TABLET | Freq: Two times a day (BID) | ORAL | Status: DC
  Administered 2019-08-16 – 2019-08-17 (×3): 325 mg via ORAL
  Filled 2019-08-15 (×3): qty 1

## 2019-08-15 MED ORDER — LIDOCAINE 2% (20 MG/ML) 5 ML SYRINGE
INTRAMUSCULAR | Status: DC | PRN
  Administered 2019-08-15: 100 mg via INTRAVENOUS

## 2019-08-15 MED ORDER — PROPOFOL 10 MG/ML IV BOLUS
INTRAVENOUS | Status: DC | PRN
  Administered 2019-08-15: 170 mg via INTRAVENOUS

## 2019-08-15 MED ORDER — CHLORHEXIDINE GLUCONATE 0.12 % MT SOLN
15.0000 mL | Freq: Once | OROMUCOSAL | Status: AC
  Administered 2019-08-15: 15 mL via OROMUCOSAL

## 2019-08-15 MED ORDER — HYDROMORPHONE HCL 1 MG/ML IJ SOLN
0.5000 mg | INTRAMUSCULAR | Status: DC | PRN
  Administered 2019-08-15: 0.5 mg via INTRAVENOUS
  Filled 2019-08-15: qty 1

## 2019-08-15 MED ORDER — MAGNESIUM CITRATE PO SOLN: 1.0000 | Freq: Once | ORAL | Status: DC | PRN

## 2019-08-15 MED ORDER — SODIUM CHLORIDE 0.9 % IR SOLN
Status: DC | PRN
  Administered 2019-08-15: 2000 mL

## 2019-08-15 MED ORDER — METOCLOPRAMIDE HCL 5 MG/ML IJ SOLN: 5.0000 mg | Freq: Three times a day (TID) | INTRAMUSCULAR | Status: DC | PRN

## 2019-08-15 MED ORDER — DEXAMETHASONE SODIUM PHOSPHATE 4 MG/ML IJ SOLN
INTRAMUSCULAR | Status: DC | PRN
Start: 2019-08-15 — End: 2019-08-15
  Administered 2019-08-15: 8 mg via PERINEURAL

## 2019-08-15 MED ORDER — ONDANSETRON HCL 4 MG/2ML IJ SOLN: 4.0000 mg | Freq: Four times a day (QID) | INTRAMUSCULAR | Status: DC | PRN

## 2019-08-15 MED ORDER — SIMVASTATIN 20 MG PO TABS
20.0000 mg | ORAL_TABLET | Freq: Every day | ORAL | Status: DC
  Administered 2019-08-15 – 2019-08-16 (×2): 20 mg via ORAL
  Filled 2019-08-15 (×2): qty 1

## 2019-08-15 MED ORDER — HYDROCODONE-ACETAMINOPHEN 7.5-325 MG PO TABS
1.0000 | ORAL_TABLET | ORAL | Status: DC | PRN
  Administered 2019-08-16 – 2019-08-17 (×9): 2 via ORAL
  Filled 2019-08-15 (×9): qty 2

## 2019-08-15 MED ORDER — INSULIN ASPART 100 UNIT/ML ~~LOC~~ SOLN
SUBCUTANEOUS | Status: AC
  Administered 2019-08-15: 5 [IU]
  Filled 2019-08-15: qty 1

## 2019-08-15 MED ORDER — CEFAZOLIN SODIUM-DEXTROSE 2-4 GM/100ML-% IV SOLN
2.0000 g | INTRAVENOUS | Status: AC
  Administered 2019-08-15: 2 g via INTRAVENOUS
  Filled 2019-08-15: qty 100

## 2019-08-15 MED ORDER — ONDANSETRON HCL 4 MG/2ML IJ SOLN
INTRAMUSCULAR | Status: DC | PRN
  Administered 2019-08-15: 4 mg via INTRAVENOUS

## 2019-08-15 MED ORDER — ONDANSETRON HCL 4 MG PO TABS
4.0000 mg | ORAL_TABLET | Freq: Four times a day (QID) | ORAL | Status: DC | PRN
  Administered 2019-08-17: 4 mg via ORAL
  Filled 2019-08-15: qty 1

## 2019-08-15 MED ORDER — BUPIVACAINE-EPINEPHRINE (PF) 0.5% -1:200000 IJ SOLN
INTRAMUSCULAR | Status: DC | PRN
Start: 2019-08-15 — End: 2019-08-15
  Administered 2019-08-15: 30 mL via PERINEURAL

## 2019-08-15 MED ORDER — ALUM & MAG HYDROXIDE-SIMETH 200-200-20 MG/5ML PO SUSP
15.0000 mL | ORAL | Status: DC | PRN
  Administered 2019-08-17: 15 mL via ORAL
  Filled 2019-08-15: qty 30

## 2019-08-15 MED ORDER — PHENOL 1.4 % MT LIQD: 1.0000 | OROMUCOSAL | Status: DC | PRN

## 2019-08-15 MED ORDER — MIDAZOLAM HCL 2 MG/2ML IJ SOLN
INTRAMUSCULAR | Status: AC
  Filled 2019-08-15: qty 2

## 2019-08-15 MED ORDER — DIPHENHYDRAMINE HCL 12.5 MG/5ML PO ELIX: 12.5000 mg | ORAL_SOLUTION | ORAL | Status: DC | PRN

## 2019-08-15 MED ORDER — SUGAMMADEX SODIUM 200 MG/2ML IV SOLN
INTRAVENOUS | Status: DC | PRN
  Administered 2019-08-15: 260 mg via INTRAVENOUS

## 2019-08-15 MED ORDER — CLONIDINE HCL (ANALGESIA) 100 MCG/ML EP SOLN
EPIDURAL | Status: DC | PRN
  Administered 2019-08-15: 100 ug

## 2019-08-15 MED ORDER — 0.9 % SODIUM CHLORIDE (POUR BTL) OPTIME
TOPICAL | Status: DC | PRN
  Administered 2019-08-15: 1000 mL

## 2019-08-15 MED ORDER — DOCUSATE SODIUM 100 MG PO CAPS
100.0000 mg | ORAL_CAPSULE | Freq: Two times a day (BID) | ORAL | Status: DC
  Administered 2019-08-15 – 2019-08-17 (×4): 100 mg via ORAL
  Filled 2019-08-15 (×4): qty 1

## 2019-08-15 MED ORDER — MEPERIDINE HCL 50 MG/ML IJ SOLN: 6.2500 mg | INTRAMUSCULAR | Status: DC | PRN

## 2019-08-15 MED ORDER — HYDROMORPHONE HCL 2 MG/ML IJ SOLN
INTRAMUSCULAR | Status: AC
  Filled 2019-08-15: qty 1

## 2019-08-15 MED ORDER — BUPROPION HCL ER (XL) 150 MG PO TB24
150.0000 mg | ORAL_TABLET | Freq: Every day | ORAL | Status: DC
  Administered 2019-08-16 – 2019-08-17 (×2): 150 mg via ORAL
  Filled 2019-08-15 (×2): qty 1

## 2019-08-15 MED ORDER — DEXAMETHASONE SODIUM PHOSPHATE 10 MG/ML IJ SOLN
INTRAMUSCULAR | Status: AC
  Filled 2019-08-15: qty 1

## 2019-08-15 MED ORDER — SUGAMMADEX SODIUM 500 MG/5ML IV SOLN
INTRAVENOUS | Status: AC
  Filled 2019-08-15: qty 5

## 2019-08-15 MED ORDER — INSULIN ASPART 100 UNIT/ML ~~LOC~~ SOLN
0.0000 [IU] | Freq: Every day | SUBCUTANEOUS | Status: DC
  Administered 2019-08-15: 5 [IU] via SUBCUTANEOUS
  Administered 2019-08-16: 3 [IU] via SUBCUTANEOUS

## 2019-08-15 MED ORDER — INSULIN ASPART 100 UNIT/ML ~~LOC~~ SOLN
0.0000 [IU] | Freq: Three times a day (TID) | SUBCUTANEOUS | Status: DC
  Administered 2019-08-15: 11 [IU] via SUBCUTANEOUS
  Administered 2019-08-16: 5 [IU] via SUBCUTANEOUS
  Administered 2019-08-16 (×2): 8 [IU] via SUBCUTANEOUS
  Administered 2019-08-17: 2 [IU] via SUBCUTANEOUS

## 2019-08-15 MED ORDER — HYDROMORPHONE HCL 1 MG/ML IJ SOLN
INTRAMUSCULAR | Status: DC | PRN
  Administered 2019-08-15 (×5): .4 mg via INTRAVENOUS

## 2019-08-15 MED ORDER — TRANEXAMIC ACID-NACL 1000-0.7 MG/100ML-% IV SOLN
INTRAVENOUS | Status: AC
  Filled 2019-08-15: qty 100

## 2019-08-15 MED ORDER — LABETALOL HCL 5 MG/ML IV SOLN
INTRAVENOUS | Status: AC
  Filled 2019-08-15: qty 4

## 2019-08-15 MED ORDER — SODIUM CHLORIDE 0.9 % IV SOLN: INTRAVENOUS | Status: DC

## 2019-08-15 MED ORDER — TRANEXAMIC ACID-NACL 1000-0.7 MG/100ML-% IV SOLN
1000.0000 mg | Freq: Once | INTRAVENOUS | Status: AC
  Administered 2019-08-15: 1000 mg via INTRAVENOUS
  Filled 2019-08-15: qty 100

## 2019-08-15 MED ORDER — HYDROCODONE-ACETAMINOPHEN 5-325 MG PO TABS
1.0000 | ORAL_TABLET | ORAL | Status: DC | PRN
  Administered 2019-08-15 (×2): 2 via ORAL
  Filled 2019-08-15 (×2): qty 2

## 2019-08-15 MED ORDER — ALBUTEROL SULFATE (2.5 MG/3ML) 0.083% IN NEBU
3.0000 mL | INHALATION_SOLUTION | Freq: Four times a day (QID) | RESPIRATORY_TRACT | Status: DC | PRN
  Administered 2019-08-16: 3 mL via RESPIRATORY_TRACT
  Filled 2019-08-15: qty 3

## 2019-08-15 MED ORDER — METOCLOPRAMIDE HCL 5 MG PO TABS: 5.0000 mg | ORAL_TABLET | Freq: Three times a day (TID) | ORAL | Status: DC | PRN

## 2019-08-15 MED ORDER — ORAL CARE MOUTH RINSE: 15.0000 mL | Freq: Once | OROMUCOSAL | Status: AC

## 2019-08-15 MED ORDER — POVIDONE-IODINE 10 % EX SWAB
2.0000 "application " | Freq: Once | CUTANEOUS | Status: AC
  Administered 2019-08-15: 2 via TOPICAL

## 2019-08-15 MED ORDER — PROPOFOL 10 MG/ML IV BOLUS
INTRAVENOUS | Status: AC
  Filled 2019-08-15: qty 20

## 2019-08-15 MED ORDER — FENTANYL CITRATE (PF) 100 MCG/2ML IJ SOLN
25.0000 ug | INTRAMUSCULAR | Status: DC | PRN
  Administered 2019-08-15 (×3): 50 ug via INTRAVENOUS

## 2019-08-15 MED ORDER — GLIMEPIRIDE 4 MG PO TABS
4.0000 mg | ORAL_TABLET | Freq: Every day | ORAL | Status: DC
  Administered 2019-08-16 – 2019-08-17 (×2): 4 mg via ORAL
  Filled 2019-08-15 (×2): qty 1

## 2019-08-15 MED ORDER — KETOROLAC TROMETHAMINE 30 MG/ML IJ SOLN
INTRAMUSCULAR | Status: DC | PRN
  Administered 2019-08-15: 30 mg

## 2019-08-15 MED ORDER — ONDANSETRON HCL 4 MG/2ML IJ SOLN: 4.0000 mg | Freq: Once | INTRAMUSCULAR | Status: DC | PRN

## 2019-08-15 MED ORDER — KETAMINE HCL 10 MG/ML IJ SOLN
INTRAMUSCULAR | Status: DC | PRN
  Administered 2019-08-15: 55 mg via INTRAVENOUS

## 2019-08-15 MED ORDER — FENTANYL CITRATE (PF) 250 MCG/5ML IJ SOLN
INTRAMUSCULAR | Status: DC | PRN
  Administered 2019-08-15 (×2): 50 ug via INTRAVENOUS
  Administered 2019-08-15: 150 ug via INTRAVENOUS
  Administered 2019-08-15: 100 ug via INTRAVENOUS
  Administered 2019-08-15 (×2): 50 ug via INTRAVENOUS

## 2019-08-15 MED ORDER — ROCURONIUM BROMIDE 10 MG/ML (PF) SYRINGE
PREFILLED_SYRINGE | INTRAVENOUS | Status: DC | PRN
  Administered 2019-08-15: 10 mg via INTRAVENOUS
  Administered 2019-08-15: 50 mg via INTRAVENOUS

## 2019-08-15 MED ORDER — GABAPENTIN 100 MG PO CAPS
100.0000 mg | ORAL_CAPSULE | Freq: Two times a day (BID) | ORAL | Status: DC
  Administered 2019-08-15 – 2019-08-17 (×5): 100 mg via ORAL
  Filled 2019-08-15 (×5): qty 1

## 2019-08-15 MED ORDER — BUPIVACAINE HCL (PF) 0.25 % IJ SOLN
INTRAMUSCULAR | Status: AC
  Filled 2019-08-15: qty 30

## 2019-08-15 MED ORDER — MENTHOL 3 MG MT LOZG: 1.0000 | LOZENGE | OROMUCOSAL | Status: DC | PRN

## 2019-08-15 MED ORDER — LABETALOL HCL 5 MG/ML IV SOLN
INTRAVENOUS | Status: DC | PRN
Start: 2019-08-15 — End: 2019-08-15
  Administered 2019-08-15 (×2): 5 mg via INTRAVENOUS

## 2019-08-15 SURGICAL SUPPLY — 64 items
ATTUNE PSRP INSR SZ5 5 KNEE (Insert) ×2 IMPLANT
ATTUNE PSRP INSR SZ5 5MM KNEE (Insert) ×1 IMPLANT
BAG DECANTER FOR FLEXI CONT (MISCELLANEOUS) IMPLANT
BAG ZIPLOCK 12X15 (MISCELLANEOUS) IMPLANT
BLADE SAW SGTL 11.0X1.19X90.0M (BLADE) IMPLANT
BLADE SAW SGTL 13.0X1.19X90.0M (BLADE) ×3 IMPLANT
BLADE SAW SGTL 81X20 HD (BLADE) ×3 IMPLANT
BLADE SURG SZ10 CARB STEEL (BLADE) ×6 IMPLANT
BNDG ELASTIC 6X5.8 VLCR STR LF (GAUZE/BANDAGES/DRESSINGS) ×3 IMPLANT
BRUSH FEMORAL CANAL (MISCELLANEOUS) IMPLANT
CEMENT HV SMART SET (Cement) ×6 IMPLANT
COVER SURGICAL LIGHT HANDLE (MISCELLANEOUS) ×3 IMPLANT
COVER WAND RF STERILE (DRAPES) IMPLANT
CUFF TOURN SGL QUICK 34 (TOURNIQUET CUFF) ×3
CUFF TRNQT CYL 34X4.125X (TOURNIQUET CUFF) ×1 IMPLANT
DECANTER SPIKE VIAL GLASS SM (MISCELLANEOUS) IMPLANT
DERMABOND ADVANCED (GAUZE/BANDAGES/DRESSINGS) ×2
DERMABOND ADVANCED .7 DNX12 (GAUZE/BANDAGES/DRESSINGS) ×1 IMPLANT
DRAPE U-SHAPE 47X51 STRL (DRAPES) ×3 IMPLANT
DRESSING AQUACEL AG SP 3.5X10 (GAUZE/BANDAGES/DRESSINGS) IMPLANT
DRSG AQUACEL AG ADV 3.5X10 (GAUZE/BANDAGES/DRESSINGS) ×3 IMPLANT
DRSG AQUACEL AG SP 3.5X10 (GAUZE/BANDAGES/DRESSINGS)
DURAPREP 26ML APPLICATOR (WOUND CARE) ×6 IMPLANT
ELECT REM PT RETURN 15FT ADLT (MISCELLANEOUS) ×3 IMPLANT
GAUZE SPONGE 2X2 8PLY STRL LF (GAUZE/BANDAGES/DRESSINGS) IMPLANT
GLOVE BIOGEL PI IND STRL 7.5 (GLOVE) ×1 IMPLANT
GLOVE BIOGEL PI IND STRL 8.5 (GLOVE) ×1 IMPLANT
GLOVE BIOGEL PI INDICATOR 7.5 (GLOVE) ×2
GLOVE BIOGEL PI INDICATOR 8.5 (GLOVE) ×2
GLOVE ECLIPSE 8.0 STRL XLNG CF (GLOVE) ×6 IMPLANT
GLOVE ORTHO TXT STRL SZ7.5 (GLOVE) ×3 IMPLANT
GOWN STRL REUS W/TWL 2XL LVL3 (GOWN DISPOSABLE) ×3 IMPLANT
GOWN STRL REUS W/TWL LRG LVL3 (GOWN DISPOSABLE) ×3 IMPLANT
GOWN STRL REUS W/TWL XL LVL3 (GOWN DISPOSABLE) IMPLANT
HANDPIECE INTERPULSE COAX TIP (DISPOSABLE) ×3
HOLDER FOLEY CATH W/STRAP (MISCELLANEOUS) IMPLANT
INSERT TIB CMT ATTUNE RP SZ3 (Knees) ×3 IMPLANT
KIT TURNOVER KIT A (KITS) IMPLANT
MANIFOLD NEPTUNE II (INSTRUMENTS) ×3 IMPLANT
NDL SAFETY ECLIPSE 18X1.5 (NEEDLE) IMPLANT
NEEDLE HYPO 18GX1.5 SHARP (NEEDLE)
NS IRRIG 1000ML POUR BTL (IV SOLUTION) ×3 IMPLANT
PACK TOTAL KNEE CUSTOM (KITS) ×3 IMPLANT
PENCIL SMOKE EVACUATOR (MISCELLANEOUS) IMPLANT
PIN FIX SIGMA LCS THRD HI (PIN) ×3 IMPLANT
PROTECTOR NERVE ULNAR (MISCELLANEOUS) ×3 IMPLANT
SET HNDPC FAN SPRY TIP SCT (DISPOSABLE) ×1 IMPLANT
SET PAD KNEE POSITIONER (MISCELLANEOUS) ×3 IMPLANT
SLEEVE KNEE ATTUNE 29MM (Knees) ×3 IMPLANT
SPONGE GAUZE 2X2 STER 10/PKG (GAUZE/BANDAGES/DRESSINGS)
STAPLER VISISTAT 35W (STAPLE) IMPLANT
STEM STR ATTUNE PF 16X60 (Knees) ×3 IMPLANT
SUT MNCRL AB 3-0 PS2 18 (SUTURE) ×3 IMPLANT
SUT STRATAFIX PDS+ 0 24IN (SUTURE) ×3 IMPLANT
SUT VIC AB 1 CT1 36 (SUTURE) ×3 IMPLANT
SUT VIC AB 2-0 CT1 27 (SUTURE) ×9
SUT VIC AB 2-0 CT1 TAPERPNT 27 (SUTURE) ×3 IMPLANT
SYR 50ML LL SCALE MARK (SYRINGE) IMPLANT
TOWER CARTRIDGE SMART MIX (DISPOSABLE) ×3 IMPLANT
TRAY FOLEY MTR SLVR 16FR STAT (SET/KITS/TRAYS/PACK) ×3 IMPLANT
TUBE KAMVAC SUCTION (TUBING) IMPLANT
WATER STERILE IRR 1000ML POUR (IV SOLUTION) ×3 IMPLANT
WRAP KNEE MAXI GEL POST OP (GAUZE/BANDAGES/DRESSINGS) ×3 IMPLANT
YANKAUER SUCT BULB TIP 10FT TU (MISCELLANEOUS) IMPLANT

## 2019-08-15 NOTE — Progress Notes (Signed)
Pt refused CPAP qhs.  Pt states that she hasn't worn CPAP in over a year and does not wish to wear it while in the hospital.  Pt encouraged to contact RT should she change her mind.

## 2019-08-15 NOTE — Interval H&P Note (Signed)
History and Physical Interval Note:  08/15/2019 7:35 AM  [name] [name]  has presented today for surgery, with the diagnosis of Failed left knee, loose tibial component.  The various methods of treatment have been discussed with the patient and family. After consideration of risks, benefits and other options for treatment, the patient has consented to  Procedure(s) with comments: TOTAL KNEE REVISION, LIKELY ISOLATED TIBIAL REVISION (Left) - 2 hrs as a surgical intervention.  The patient's history has been reviewed, patient examined, no change in status, stable for surgery.  I have reviewed the patient's chart and labs.  Questions were answered to the patient's satisfaction.     Mauri Pole

## 2019-08-15 NOTE — Discharge Instructions (Signed)

## 2019-08-15 NOTE — Anesthesia Procedure Notes (Signed)
Procedure Name: Intubation Date/Time: 08/15/2019 7:53 AM Performed by: Sharlette Dense, CRNA Patient Re-evaluated:Patient Re-evaluated prior to induction Oxygen Delivery Method: Circle system utilized Preoxygenation: Pre-oxygenation with 100% oxygen Induction Type: IV induction Ventilation: Mask ventilation without difficulty and Oral airway inserted - appropriate to patient size Laryngoscope Size: Sabra Heck and 2 Grade View: Grade I Tube type: Oral Tube size: 7.5 mm Number of attempts: 1 Airway Equipment and Method: Stylet Placement Confirmation: ETT inserted through vocal cords under direct vision,  positive ETCO2 and breath sounds checked- equal and bilateral Secured at: 21 cm Tube secured with: Tape Dental Injury: Teeth and Oropharynx as per pre-operative assessment

## 2019-08-15 NOTE — Transfer of Care (Signed)
Immediate Anesthesia Transfer of Care Note  Patient: [name]  Procedure(s) Performed: LEFT KNEE REVISION (Left Knee)  Patient Location: PACU  Anesthesia Type:GA combined with regional for post-op pain  Level of Consciousness: awake  Airway & Oxygen Therapy: Patient Spontanous Breathing and Patient connected to face mask oxygen  Post-op Assessment: Report given to RN and Post -op Vital signs reviewed and stable  Post vital signs: Reviewed and stable  Last Vitals:  Vitals Value Taken Time  BP 162/95 08/15/19 1008  Temp 37.1 C 08/15/19 1008  Pulse 72 08/15/19 1008  Resp 9 08/15/19 1008  SpO2 96 % 08/15/19 1008  Vitals shown include unvalidated device data.  Last Pain:  Vitals:   08/15/19 0546  TempSrc:   PainSc: 3       Patients Stated Pain Goal: 3 (85/02/77 4128)  Complications: No complications documented.

## 2019-08-15 NOTE — Evaluation (Signed)
Physical Therapy Evaluation Patient Details Name: [name] [name] MRN: [id number] DOB: 09/10/1955 Today's Date: 08/15/2019   History of Present Illness  Pt s/p revision of L TKR and with hx of DM, Bil TKR, liver dz, and diabetic neuropathy bil feet  Clinical Impression  Pt s/p L TKR revision and presents with decreased L LE strength/ROM and post op pain limiting functional mobility.  Pt should progress to dc home with family assist.    Follow Up Recommendations Follow surgeon's recommendation for DC plan and follow-up therapies    Equipment Recommendations  3in1 (PT)    Recommendations for Other Services       Precautions / Restrictions Precautions Precautions: Knee Restrictions Weight Bearing Restrictions: No Other Position/Activity Restrictions: WBAT      Mobility  Bed Mobility Overal bed mobility: Needs Assistance Bed Mobility: Supine to Sit     Supine to sit: Min assist     General bed mobility comments: cues for sequence and use of R LE to self assist  Transfers Overall transfer level: Needs assistance Equipment used: Rolling walker (2 wheeled) Transfers: Sit to/from Stand Sit to Stand: Min assist         General transfer comment: cues for LE management and use of UEs to self assist  Ambulation/Gait Ambulation/Gait assistance: Min assist Gait Distance (Feet): 21 Feet Assistive device: Rolling walker (2 wheeled) Gait Pattern/deviations: Step-to pattern;Decreased step length - right;Decreased step length - left;Shuffle;Trunk flexed Gait velocity: decr   General Gait Details: cues for sequence, posture and position from RW; distance pain ltd  Stairs            Wheelchair Mobility    Modified Rankin (Stroke Patients Only)       Balance                                             Pertinent Vitals/Pain Pain Assessment: 0-10 Pain Score: 7  Pain Location: L knee Pain Descriptors / Indicators: Aching;Sore Pain  Intervention(s): Limited activity within patient's tolerance;Monitored during session;Premedicated before session;Patient requesting pain meds-RN notified;Ice applied    Home Living Family/patient expects to be discharged to:: Private residence Living Arrangements: Spouse/significant other Available Help at Discharge: Family Type of Home: House Home Access: Level entry     Home Layout: One [place]: Environmental consultant - 2 wheels      Prior Function Level of Independence: Independent;Independent with assistive device(s)               Hand Dominance        Extremity/Trunk Assessment   Upper Extremity Assessment Upper Extremity Assessment: Overall WFL for tasks assessed    Lower Extremity Assessment Lower Extremity Assessment: LLE deficits/detail    Cervical / Trunk Assessment Cervical / Trunk Assessment: Normal  Communication   Communication: No difficulties  Cognition Arousal/Alertness: Awake/alert Behavior During Therapy: WFL for tasks assessed/performed                                          General Comments      Exercises Total Joint Exercises Ankle Circles/Pumps: AROM;Both;15 reps;Supine   Assessment/Plan    PT Assessment Patient needs continued PT services  PT Problem List Decreased strength;Decreased range of motion;Decreased activity tolerance;Decreased mobility;Decreased knowledge of use of DME;Obesity;Pain  PT Treatment Interventions DME instruction;Gait training;Functional mobility training;Therapeutic activities;Therapeutic exercise;Patient/family education    PT Goals (Current goals can be found in the Care Plan section)  Acute Rehab PT Goals Patient Stated Goal: Regain IND PT Goal Formulation: With patient Time For Goal Achievement: 08/22/19 Potential to Achieve Goals: Good    Frequency 7X/week   Barriers to discharge        Co-evaluation               AM-PAC PT "6 Clicks" Mobility  Outcome  Measure Help needed turning from your back to your side while in a flat bed without using bedrails?: A Little Help needed moving from lying on your back to sitting on the side of a flat bed without using bedrails?: A Little Help needed moving to and from a bed to a chair (including a wheelchair)?: A Little Help needed standing up from a chair using your arms (e.g., wheelchair or bedside chair)?: A Little Help needed to walk in hospital room?: A Little Help needed climbing 3-5 steps with a railing? : A Little 6 Click Score: 18    End of Session Equipment Utilized During Treatment: Gait belt Activity Tolerance: Patient tolerated treatment well;Patient limited by pain Patient left: in chair;with call bell/phone within reach;with chair alarm set Nurse Communication: Mobility status PT Visit Diagnosis: Difficulty in walking, not elsewhere classified (R26.2)    Time: 7290-2111 PT Time Calculation (min) (ACUTE ONLY): 20 min   Charges:   PT Evaluation $PT Eval Low Complexity: [place] Pager [phone] Office [phone]   Houa Ackert 08/15/2019, 5:22 PM

## 2019-08-15 NOTE — Plan of Care (Signed)
Plan of care 

## 2019-08-15 NOTE — Plan of Care (Signed)

## 2019-08-15 NOTE — Anesthesia Procedure Notes (Signed)
Anesthesia Regional Block: Adductor canal block   Pre-Anesthetic Checklist: ,, timeout performed, Correct Patient, Correct Site, Correct Laterality, Correct Procedure, Correct Position, site marked, Risks and benefits discussed,  Surgical consent,  Pre-op evaluation,  At surgeon's request and post-op pain management  Laterality: Left  Prep: chloraprep       Needles:  Injection technique: Single-shot  Needle Type: Stimiplex     Needle Length: 9cm  Needle Gauge: 21     Additional Needles:   Procedures:,,,, ultrasound used (permanent image in chart),,,,  Narrative:  Start time: 08/15/2019 7:07 AM End time: 08/15/2019 7:12 AM Injection made incrementally with aspirations every 5 mL.  Performed by: Personally  Anesthesiologist: Nolon Nations, MD  Additional Notes: BP cuff, EKG monitors applied. Sedation begun. Artery and nerve location verified with U/S and anesthetic injected incrementally, slowly, and after negative aspirations under direct u/s guidance. Good fascial /perineural spread. Tolerated well.

## 2019-08-15 NOTE — Anesthesia Postprocedure Evaluation (Signed)
Anesthesia Post Note  Patient: [name] [name]  Procedure(s) Performed: LEFT KNEE REVISION (Left Knee)     Patient location during evaluation: PACU Anesthesia Type: General Level of consciousness: sedated and patient cooperative Pain management: pain level controlled Vital Signs Assessment: post-procedure vital signs reviewed and stable Respiratory status: spontaneous breathing Cardiovascular status: stable Anesthetic complications: no   No complications documented.  Last Vitals:  Vitals:   08/15/19 1231 08/15/19 1347  BP: (!) 146/79 (!) 142/79  Pulse: 67 66  Resp: 10 12  Temp: 36.9 C 36.8 C  SpO2: 94% 94%    Last Pain:  Vitals:   08/15/19 1347  TempSrc: Oral  PainSc:                  Nolon Nations

## 2019-08-15 NOTE — Telephone Encounter (Signed)
Cardiology referral as she is unable to complete four mets for me to be able to clear her

## 2019-08-15 NOTE — Brief Op Note (Signed)
08/15/2019  9:36 AM  PATIENT:  [name] [name]  64 y.o. female  PRE-OPERATIVE DIAGNOSIS:  Failed left knee, loose tibial component  POST-OPERATIVE DIAGNOSIS:  Failed left knee, loose tibial component  PROCEDURE:  Procedure(s) with comments: LEFT KNEE REVISION (Left) - 2 hrs  SURGEON:  Surgeon(s) and Role:    Paralee Cancel, MD - Primary  PHYSICIAN ASSISTANT: Griffith Citron, PA-C  ANESTHESIA:   regional and spinal  EBL:  200 mL   BLOOD ADMINISTERED:none  DRAINS: none   LOCAL MEDICATIONS USED:  MARCAINE     SPECIMEN:  No Specimen  DISPOSITION OF SPECIMEN:  N/A  COUNTS:  YES  TOURNIQUET:   Total Tourniquet Time Documented: Thigh (Left) - 46 minutes Total: Thigh (Left) - 46 minutes   DICTATION: .Other Dictation: Dictation Number [id number]  PLAN OF CARE: Admit to inpatient   PATIENT DISPOSITION:  PACU - hemodynamically stable.   Delay start of Pharmacological VTE agent (>24hrs) due to surgical blood loss or risk of bleeding: no

## 2019-08-16 ENCOUNTER — Encounter (HOSPITAL_COMMUNITY): Payer: Self-pay | Admitting: Orthopedic Surgery

## 2019-08-16 DIAGNOSIS — I152 Hypertension secondary to endocrine disorders: Secondary | ICD-10-CM | POA: Diagnosis present

## 2019-08-16 DIAGNOSIS — E1169 Type 2 diabetes mellitus with other specified complication: Secondary | ICD-10-CM | POA: Diagnosis present

## 2019-08-16 DIAGNOSIS — K219 Gastro-esophageal reflux disease without esophagitis: Secondary | ICD-10-CM | POA: Diagnosis present

## 2019-08-16 DIAGNOSIS — T84033A Mechanical loosening of internal left knee prosthetic joint, initial encounter: Secondary | ICD-10-CM | POA: Diagnosis present

## 2019-08-16 DIAGNOSIS — F329 Major depressive disorder, single episode, unspecified: Secondary | ICD-10-CM | POA: Diagnosis present

## 2019-08-16 DIAGNOSIS — Z6841 Body Mass Index (BMI) 40.0 and over, adult: Secondary | ICD-10-CM | POA: Diagnosis not present

## 2019-08-16 DIAGNOSIS — Z87891 Personal history of nicotine dependence: Secondary | ICD-10-CM | POA: Diagnosis not present

## 2019-08-16 DIAGNOSIS — E785 Hyperlipidemia, unspecified: Secondary | ICD-10-CM | POA: Diagnosis present

## 2019-08-16 DIAGNOSIS — E039 Hypothyroidism, unspecified: Secondary | ICD-10-CM | POA: Diagnosis present

## 2019-08-16 DIAGNOSIS — F419 Anxiety disorder, unspecified: Secondary | ICD-10-CM | POA: Diagnosis present

## 2019-08-16 DIAGNOSIS — G4730 Sleep apnea, unspecified: Secondary | ICD-10-CM | POA: Diagnosis present

## 2019-08-16 DIAGNOSIS — Y831 Surgical operation with implant of artificial internal device as the cause of abnormal reaction of the patient, or of later complication, without mention of misadventure at the time of the procedure: Secondary | ICD-10-CM | POA: Diagnosis present

## 2019-08-16 DIAGNOSIS — Z96651 Presence of right artificial knee joint: Secondary | ICD-10-CM | POA: Diagnosis present

## 2019-08-16 DIAGNOSIS — Z79899 Other long term (current) drug therapy: Secondary | ICD-10-CM | POA: Diagnosis not present

## 2019-08-16 DIAGNOSIS — Z7984 Long term (current) use of oral hypoglycemic drugs: Secondary | ICD-10-CM | POA: Diagnosis not present

## 2019-08-16 LAB — BASIC METABOLIC PANEL
Anion gap: 11 (ref 5–15)
BUN: 22 mg/dL (ref 8–23)
CO2: 22 mmol/L (ref 22–32)
Calcium: 8.9 mg/dL (ref 8.9–10.3)
Chloride: 103 mmol/L (ref 98–111)
Creatinine, Ser: 1.07 mg/dL — ABNORMAL HIGH (ref 0.44–1.00)
GFR calc Af Amer: >60 mL/min (ref >60)
GFR calc non Af Amer: 54 mL/min — ABNORMAL LOW (ref >60)
Glucose, Bld: 317 mg/dL — ABNORMAL HIGH (ref 70–99)
Potassium: 4.7 mmol/L (ref 3.5–5.1)
Sodium: 136 mmol/L (ref 135–145)

## 2019-08-16 LAB — CBC
HCT: 39.9 % (ref 36.0–46.0)
Hemoglobin: 14 g/dL (ref 12.0–15.0)
MCH: 32.7 pg (ref 26.0–34.0)
MCHC: 35.1 g/dL (ref 30.0–36.0)
MCV: 93.2 fL (ref 80.0–100.0)
Platelets: 154 10*3/uL (ref 150–400)
RBC: 4.28 MIL/uL (ref 3.87–5.11)
RDW: 13.1 % (ref 11.5–15.5)
WBC: 8.7 10*3/uL (ref 4.0–10.5)
nRBC: 0 % (ref 0.0–0.2)

## 2019-08-16 LAB — GLUCOSE, CAPILLARY
Glucose-Capillary: 236 mg/dL — ABNORMAL HIGH (ref 70–99)
Glucose-Capillary: 276 mg/dL — ABNORMAL HIGH (ref 70–99)
Glucose-Capillary: 270 mg/dL — ABNORMAL HIGH (ref 70–99)
Glucose-Capillary: 276 mg/dL — ABNORMAL HIGH (ref 70–99)

## 2019-08-16 LAB — SURGICAL PATHOLOGY

## 2019-08-16 MED ORDER — POLYETHYLENE GLYCOL 3350 17 G PO PACK
17.0000 g | PACK | Freq: Two times a day (BID) | ORAL | 0 refills | Status: DC
Start: 2019-08-16 — End: 2020-03-27

## 2019-08-16 MED ORDER — HYDROCODONE-ACETAMINOPHEN 7.5-325 MG PO TABS: 1.0000 | ORAL_TABLET | ORAL | 0 refills | Status: DC | PRN

## 2019-08-16 MED ORDER — FERROUS SULFATE 325 (65 FE) MG PO TABS: 325.0000 mg | ORAL_TABLET | Freq: Three times a day (TID) | ORAL | 0 refills | Status: DC

## 2019-08-16 MED ORDER — TIZANIDINE HCL 4 MG PO CAPS: 4.0000 mg | ORAL_CAPSULE | Freq: Three times a day (TID) | ORAL | 0 refills | Status: DC | PRN

## 2019-08-16 MED ORDER — DOCUSATE SODIUM 100 MG PO CAPS
100.0000 mg | ORAL_CAPSULE | Freq: Two times a day (BID) | ORAL | 0 refills | Status: DC
Start: 2019-08-16 — End: 2020-03-27

## 2019-08-16 MED ORDER — ASPIRIN 81 MG PO CHEW
81.0000 mg | CHEWABLE_TABLET | Freq: Two times a day (BID) | ORAL | 0 refills | Status: AC
Start: 2019-08-17 — End: 2019-09-16

## 2019-08-16 NOTE — Telephone Encounter (Signed)
Have discussed with patient. I have scheduled a cardiology appointment for her as well.

## 2019-08-16 NOTE — Progress Notes (Signed)
     Subjective: 1 Day Post-Op Procedure(s) (LRB): LEFT KNEE REVISION (Left)   Patient reports pain as moderate, pain controlled with medication.  No reported events throughout the night.  Discussed the procedure and expectations moving forward.  Plan for discharge probably tomorrow due to underlying medical co-morbidities, pain control and need for inpatient therapy to meet goal of being discharged home safely with family/caregiver.  Past Medical History:  Diagnosis Date  . Acute meniscal tear of knee    Left knee  . Alcohol problem drinking    rehab  . Anemia    menstrual related  . Anxiety   . Arthritis    right ankle-neuropathy  . Colon polyps   . Depression   . DM (diabetes mellitus), type 2 ([place]) 12/2009   type 2  . Evalluate for OSA (obstructive sleep apnea) 08/15/2013   No OSA on sleep study but may be underestimated.-never used cpap    . GERD (gastroesophageal reflux disease)    not currently taking.  Marland Kitchen Hyperlipidemia   . Hypertension   . Hypothyroidism   . Liver disease   . Neuromuscular disorder ([place])    neropathy bilateral feet.  . Thyroid disease       Anticipated LOS equal to or greater than 2 midnights due to - Age 65 and older with one or more of the following:  - Obesity  - Expected need for hospital services (PT, OT, Nursing) required for safe discharge  - Anticipated need for postoperative skilled nursing care or inpatient rehab  - Active co-morbidities: Diabetes   Objective:   VITALS:   Vitals:   08/16/19 0158 08/16/19 0510  BP: (!) 160/65 (!) 147/81  Pulse: 76 79  Resp: 16 16  Temp: 98.4 F (36.9 C) 98.1 F (36.7 C)  SpO2: 100% 100%    Dorsiflexion/Plantar flexion intact Incision: dressing C/D/I No cellulitis present Compartment soft  LABS Recent Labs    08/16/19 0316  HGB 14.0  HCT 39.9  WBC 8.7  PLT 154    Recent Labs    08/16/19 0316  NA 136  K 4.7  BUN 22  CREATININE 1.07*  GLUCOSE 317*      Assessment/Plan: 1 Day Post-Op Procedure(s) (LRB): LEFT KNEE REVISION (Left) Foley cath d/c'ed Advance diet Up with therapy D/C IV fluids Discharge home probably tomorrow    Morbid Obesity (BMI >40)  Estimated body mass index is 41.9 kg/m as calculated from the following:   Height as of this encounter: 5\' 6"  (1.676 m).   Weight as of this encounter: 117.8 kg. Patient also counseled that weight may inhibit the healing process Patient counseled that losing weight will help with future health issues      Danae Orleans PA-C  [facility]  [place] [address], [place], [place] [address] Phone: [phone] [email or web address] Facebook  [employer]

## 2019-08-16 NOTE — Op Note (Signed)
NAMEBRICELYN, FREESTONE MEDICAL RECORD IN:[id number] ACCOUNT [id number] DATE OF BIRTH:23-Dec-1955 FACILITY: [facility] LOCATION: [id number] PHYSICIAN:Tanairi Cypert Marian Sorrow, MD  OPERATIVE REPORT  DATE OF PROCEDURE:  08/15/2019  PREOPERATIVE DIAGNOSIS:  Failed left total knee arthroplasty, tibial component.  POSTOPERATIVE DIAGNOSIS:  Failed left total knee arthroplasty, tibial component.  PROCEDURE:  Revision left total knee arthroplasty with specific revision of the tibial component using a DePuy Attune revision tibial tray size 3 with a 29 cementless stem and a size 16 x 60 mm press-fit stem.  We used a size 5 insert to match the size 5  femur.  SURGEON:  Paralee Cancel, MD  ASSISTANT:  Griffith Citron, PA-C.  Note that Ms. Nehemiah Settle was present for the entirety of the case from preoperative positioning, perioperative management of the operative extremity, general facilitation of the case, and primary wound closure.  ANESTHESIA:  Regional plus spinal block.  SPECIMENS:  None.  COMPLICATIONS:  None.  FINDINGS:  Please see dictated operative note.  DRAINS:  None.  TOURNIQUET:  Up for 46 minutes at 250 mmHg.  COMPLICATIONS:  None.  INDICATIONS FOR PROCEDURE:  The patient is a 64 year old female status post an index left total knee arthroplasty in 2017.  She has had problems with the knee for some time.  We had reviewed her presentation, ruled out infection.  Bone scan had indicated  probable and likely loosening of the tibial component.  This confirmed with radiographs raising suspicion of some collapse in varus.  We discussed the risks of infection, DVT, component failure, need for future surgeries.  We discussed weight as a  modifiable risk factor.  Consent was obtained for benefit of pain relief.  Please note that her BMI was 43 at the time of evaluation and 41.9 at the time of surgery.  Consent was obtained for benefit of pain relief.  DESCRIPTION OF PROCEDURE:  The patient was brought to the  operative theater.  Once adequate anesthesia, preoperative antibiotics, Ancef administered as well as tranexamic acid and Decadron, she was positioned supine with a left thigh tourniquet placed.   The left lower extremity was prepped and draped in sterile fashion.  A timeout was performed identifying the patient, planned procedure, and extremity.  Her old incision was excised and extended slightly proximal and distal to allow for exposure  purposes.  Soft tissue planes created.  Median arthrotomy was then made encountering clear synovial fluid.  No signs of infection or inflammation.  Following initial exposure including synovectomy in the superior aspect medial and lateral as well as a  proximal medial peel, the knee was exposed.  We were able to subluxate the knee anteriorly following debridement around the patella and subluxating the patella laterally.  I was able to remove the tibial polyethylene by amputating the stump of the  rotating platform tray polyethylene and then removed the insert.  I then used a thin ACL saw and undermined the cement bone interface finding the tibial tray was loose.  The tibial tray was removed.  At this point, further exposure of the proximal tibia  was carried out and appropriate retractors placed.  I then used an extramedullary guide and revisited the cut trying to eliminate the varus, removing a little bit of the bone lateral as well as medial.  Once this was done, this bone was debrided.  I  removed further cement and soft tissue from the proximal medial tibia.  Once these surfaces were taken down directly to bone I opened up the proximal tibia with  a drill.  We then hand reamed and power reamed up to 16 mm to allow for either a press-fit or  a cemented stem.  I then broached and then only with a 29 broach got appropriate restoration of her proximal tibial anatomy.  We thus did a trial reduction with a 3 tibial tray with a 29 sleeve trial and with a 14 x 60 mm stem.  This  was impacted and  sat at least a few millimeters, 4-5 mm from the cut surface of the bone, which was anticipated based on her varus collapse.  A trial reduction was carried out with a size 5 insert.  With this, the knee came to full extension and flexed with the patella  tracking through the trochlea without application of pressure.  Given these findings, we removed the trial components.  Further debridement was carried out as necessary including irrigating the knee.  The synovial capsular junction of the knee was injected with 0.25% Marcaine with epinephrine, 1 mL of Toradol.  The  final components were opened and configured on the back table under my direct supervision.  I did mix some cement from the proximal aspect of the tibia.  Once the cement was ready, we placed cement along the component and on the cut surface of the  proximal tibia, then impacted the component and press-fit to the point where it would not advance any further.  Based on the trial reductions, we placed a 5 polyethylene insert and brought the knee to extension to allow the cement to cure.  Once the  cement had fully cured, excessive cement was removed, retrialed range of motion and was happy with the size 5 mm insert.  The size 5 insert was opened to match the 5 femur and placed into the tibial tray and the knee was reduced.  The tourniquet had been  let down after 46 minutes without significant hemostasis required.  Following irrigation, the extensor mechanism was reapproximated sing #1 Vicryl, #1 Stratafix suture.  The remainder of the wound was closed with 2-0 Vicryl and a running Monocryl  stitch.  The knee was then cleaned, dried and dressed sterilely using surgical glue and Aquacel dressing.  She was then brought to the recovery room in stable condition, tolerating the procedure well.  Findings were reviewed with her husband.  We will  have her remain in the hospital.  She can be weightbearing as tolerated.  Plan is to see [place]  back in the office in 2 weeks upon discharge.  CN/NUANCE  D:08/15/2019 T:08/16/2019 JOB:[id number]

## 2019-08-16 NOTE — Progress Notes (Signed)
CPAP refused, made aware to notify if needed.

## 2019-08-16 NOTE — Progress Notes (Signed)
Physical Therapy Treatment Patient Details Name: [name] [name] MRN: [id number] DOB: 1956/01/24 Today's Date: 08/16/2019    History of Present Illness Pt s/p revision of L TKR and with hx of DM, Bil TKR, liver dz, and diabetic neuropathy bil feet    PT Comments    Pt progressing well with mobility; pain well controlled   Follow Up Recommendations  Follow surgeon's recommendation for DC plan and follow-up therapies     Equipment Recommendations  3in1 (PT)    Recommendations for Other Services       Precautions / Restrictions Precautions Precautions: Knee Restrictions Weight Bearing Restrictions: No Other Position/Activity Restrictions: WBAT    Mobility  Bed Mobility               General bed mobility comments: Pt up in chair and requests back to same  Transfers Overall transfer level: Needs assistance Equipment used: Rolling walker (2 wheeled) Transfers: Sit to/from Stand Sit to Stand: Min guard         General transfer comment: cues for LE management and use of UEs to self assist  Ambulation/Gait Ambulation/Gait assistance: Min guard Gait Distance (Feet): 180 Feet Assistive device: Rolling walker (2 wheeled) Gait Pattern/deviations: Step-to pattern;Decreased step length - right;Decreased step length - left;Shuffle;Trunk flexed Gait velocity: decr   General Gait Details: cues for sequence, posture and position from [employer]             Wheelchair Mobility    Modified Rankin (Stroke Patients Only)       Balance Overall balance assessment: Mild deficits observed, not formally tested                                          Cognition Arousal/Alertness: Awake/alert Behavior During Therapy: WFL for tasks assessed/performed Overall Cognitive Status: Within Functional Limits for tasks assessed                                        Exercises Total Joint Exercises Ankle Circles/Pumps: AROM;Both;15  reps;Supine Quad Sets: AROM;Both;10 reps;Supine Heel Slides: AAROM;Left;15 reps;Supine Straight Leg Raises: AAROM;Left;15 reps;Supine Goniometric ROM: AAROM L knee -5 - 60    General Comments        Pertinent Vitals/Pain Pain Assessment: 0-10 Pain Score: 5  Pain Location: L knee Pain Descriptors / Indicators: Aching;Sore Pain Intervention(s): Limited activity within patient's tolerance;Monitored during session;Premedicated before session;Ice applied    Home Living                      Prior Function            PT Goals (current goals can now be found in the care plan section) Acute Rehab PT Goals Patient Stated Goal: Regain IND PT Goal Formulation: With patient Time For Goal Achievement: 08/22/19 Potential to Achieve Goals: Good Progress towards PT goals: Progressing toward goals    Frequency    7X/week      PT Plan Current plan remains appropriate    Co-evaluation              AM-PAC PT "6 Clicks" Mobility   Outcome Measure  Help needed turning from your back to your side while in a flat bed without using bedrails?: A Little Help needed moving from lying on  your back to sitting on the side of a flat bed without using bedrails?: A Little Help needed moving to and from a bed to a chair (including a wheelchair)?: A Little Help needed standing up from a chair using your arms (e.g., wheelchair or bedside chair)?: A Little Help needed to walk in hospital room?: A Little Help needed climbing 3-5 steps with a railing? : A Little 6 Click Score: 18    End of Session Equipment Utilized During Treatment: Gait belt Activity Tolerance: Patient tolerated treatment well Patient left: in chair;with call bell/phone within reach;with chair alarm set Nurse Communication: Mobility status PT Visit Diagnosis: Difficulty in walking, not elsewhere classified (R26.2)     Time: 4765-4650 PT Time Calculation (min) (ACUTE ONLY): 22 min  Charges:  $Gait Training:  8-22 mins $Therapeutic Exercise: 8-22 mins                     [place] Pager [phone] Office [phone]    Jakeline Dave 08/16/2019, 2:34 PM

## 2019-08-16 NOTE — Plan of Care (Signed)
  Problem: Education: Goal: Knowledge of General Education information will improve Description: Including pain rating scale, medication(s)/side effects and non-pharmacologic comfort measures Outcome: Progressing   Problem: Health Behavior/Discharge Planning: Goal: Ability to manage health-related needs will improve Outcome: Progressing   Problem: Activity: Goal: Risk for activity intolerance will decrease Outcome: Progressing   Problem: Nutrition: Goal: Adequate nutrition will be maintained Outcome: Progressing   Problem: Coping: Goal: Level of anxiety will decrease Outcome: Progressing   Problem: Elimination: Goal: Will not experience complications related to urinary retention Outcome: Progressing   Problem: Pain Managment: Goal: General experience of comfort will improve Outcome: Progressing   

## 2019-08-16 NOTE — Progress Notes (Signed)
Physical Therapy Treatment Patient Details Name: [name] [name] MRN: [id number] DOB: 1955/08/06 Today's Date: 08/16/2019    History of Present Illness Pt s/p revision of L TKR and with hx of DM, Bil TKR, liver dz, and diabetic neuropathy bil feet    PT Comments    Marked improvement in activity tolerance with improved pain control.  Pt ambulated increased distance in hall and performed HEP with assist.  Pt reports first OP PT this coming Monday 08/19/19.  Follow Up Recommendations  Follow surgeon's recommendation for DC plan and follow-up therapies     Equipment Recommendations  3in1 (PT)    Recommendations for Other Services       Precautions / Restrictions Precautions Precautions: Knee Restrictions Weight Bearing Restrictions: No Other Position/Activity Restrictions: WBAT    Mobility  Bed Mobility               General bed mobility comments: Pt up in chair and requests back to same  Transfers Overall transfer level: Needs assistance Equipment used: Rolling walker (2 wheeled) Transfers: Sit to/from Stand Sit to Stand: Min guard         General transfer comment: cues for LE management and use of UEs to self assist  Ambulation/Gait Ambulation/Gait assistance: Min guard Gait Distance (Feet): 100 Feet Assistive device: Rolling walker (2 wheeled) Gait Pattern/deviations: Step-to pattern;Decreased step length - right;Decreased step length - left;Shuffle;Trunk flexed Gait velocity: decr   General Gait Details: cues for sequence, posture and position from [employer]             Wheelchair Mobility    Modified Rankin (Stroke Patients Only)       Balance                                            Cognition Arousal/Alertness: Awake/alert Behavior During Therapy: [facility] for tasks assessed/performed                                          Exercises Total Joint Exercises Ankle Circles/Pumps: AROM;Both;15  reps;Supine Quad Sets: AROM;Both;10 reps;Supine Heel Slides: AAROM;Left;15 reps;Supine Straight Leg Raises: AAROM;Left;15 reps;Supine Goniometric ROM: AAROM L knee -5 - 60    General Comments        Pertinent Vitals/Pain Pain Assessment: 0-10 Pain Score: 5  Pain Location: L knee Pain Descriptors / Indicators: Aching;Sore Pain Intervention(s): Limited activity within patient's tolerance;Monitored during session;Premedicated before session;Ice applied    Home Living                      Prior Function            PT Goals (current goals can now be found in the care plan section) Acute Rehab PT Goals Patient Stated Goal: Regain IND PT Goal Formulation: With patient Time For Goal Achievement: 08/22/19 Potential to Achieve Goals: Good Progress towards PT goals: Progressing toward goals    Frequency    7X/week      PT Plan Current plan remains appropriate    Co-evaluation              AM-PAC PT "6 Clicks" Mobility   Outcome Measure  Help needed turning from your back to your side while in a flat bed without using bedrails?:  A Little Help needed moving from lying on your back to sitting on the side of a flat bed without using bedrails?: A Little Help needed moving to and from a bed to a chair (including a wheelchair)?: A Little Help needed standing up from a chair using your arms (e.g., wheelchair or bedside chair)?: A Little Help needed to walk in hospital room?: A Little Help needed climbing 3-5 steps with a railing? : A Little 6 Click Score: 18    End of Session Equipment Utilized During Treatment: Gait belt Activity Tolerance: Patient tolerated treatment well Patient left: in chair;with call bell/phone within reach;with chair alarm set Nurse Communication: Mobility status PT Visit Diagnosis: Difficulty in walking, not elsewhere classified (R26.2)     Time: 2493-2419 PT Time Calculation (min) (ACUTE ONLY): 29 min  Charges:  $Gait Training:  8-22 mins $Therapeutic Exercise: 8-22 mins                     [place] Pager [phone] Office [phone]    Domenica Weightman 08/16/2019, 12:32 PM

## 2019-08-16 NOTE — TOC Transition Note (Signed)
Transition of Care [facility]) - CM/SW Discharge Note   Patient Details  Name: [name] [name] MRN: [id number] Date of Birth: 1955/06/25  [facility] CM/SW Contact:  Lia Hopping, [place] Phone Number: 08/16/2019, 11:51 AM   Clinical Narrative:    Therapy Plan: OPPT Patient has RW and requested a 3 in 1. 3 IN 1 ordered through [place] and delivered to the patient bedside.    Final next level of care: OP Rehab (OPPT [facility]) Barriers to Discharge: No Barriers Identified   Patient Goals and CMS Choice     Choice offered to / list presented to : NA  Discharge Placement                       Discharge Plan and Services                DME Arranged: 3-N-1 DME Agency: [employer] Date DME Agency Contacted: 08/16/19 Time DME Agency Contacted: 0900 Representative spoke with at DME Agency: [place]: NA        Social Determinants of Health ([place]) Interventions     Readmission Risk Interventions No flowsheet data found.

## 2019-08-17 LAB — BASIC METABOLIC PANEL
Anion gap: 6 (ref 5–15)
BUN: 26 mg/dL — ABNORMAL HIGH (ref 8–23)
CO2: 26 mmol/L (ref 22–32)
Calcium: 8.7 mg/dL — ABNORMAL LOW (ref 8.9–10.3)
Chloride: 101 mmol/L (ref 98–111)
Creatinine, Ser: 0.88 mg/dL (ref 0.44–1.00)
GFR calc Af Amer: >60 mL/min (ref >60)
GFR calc non Af Amer: >60 mL/min (ref >60)
Glucose, Bld: 239 mg/dL — ABNORMAL HIGH (ref 70–99)
Potassium: 4.5 mmol/L (ref 3.5–5.1)
Sodium: 133 mmol/L — ABNORMAL LOW (ref 135–145)

## 2019-08-17 LAB — CBC
HCT: 34.2 % — ABNORMAL LOW (ref 36.0–46.0)
Hemoglobin: 12 g/dL (ref 12.0–15.0)
MCH: 33.2 pg (ref 26.0–34.0)
MCHC: 35.1 g/dL (ref 30.0–36.0)
MCV: 94.7 fL (ref 80.0–100.0)
Platelets: 175 10*3/uL (ref 150–400)
RBC: 3.61 MIL/uL — ABNORMAL LOW (ref 3.87–5.11)
RDW: 13.3 % (ref 11.5–15.5)
WBC: 10 10*3/uL (ref 4.0–10.5)
nRBC: 0 % (ref 0.0–0.2)

## 2019-08-17 LAB — GLUCOSE, CAPILLARY: Glucose-Capillary: 136 mg/dL — ABNORMAL HIGH (ref 70–99)

## 2019-08-17 NOTE — Progress Notes (Signed)
Physical Therapy Treatment Patient Details Name: [name] [name] MRN: [id number] DOB: 01-27-1956 Today's Date: 08/17/2019    History of Present Illness Pt s/p revision of L TKR and with hx of DM, Bil TKR, liver dz, and diabetic neuropathy bil feet    PT Comments    Pt continues to progress well with mobility.  Pt up to ambulate increased distance in hall and performed HEp with assist - written instruction provided and reviewed.   Follow Up Recommendations  Follow surgeon's recommendation for DC plan and follow-up therapies     Equipment Recommendations  3in1 (PT)    Recommendations for Other Services       Precautions / Restrictions Precautions Precautions: Knee Restrictions Weight Bearing Restrictions: No Other Position/Activity Restrictions: WBAT    Mobility  Bed Mobility Overal bed mobility: Needs Assistance Bed Mobility: Supine to Sit;Sit to Supine     Supine to sit: Supervision Sit to supine: Min guard   General bed mobility comments: min cues for sequence  Transfers Overall transfer level: Needs assistance Equipment used: Rolling walker (2 wheeled) Transfers: Sit to/from Stand Sit to Stand: Min guard;Supervision         General transfer comment: cues for LE management and use of UEs to self assist  Ambulation/Gait Ambulation/Gait assistance: Min guard;Supervision Gait Distance (Feet): 200 Feet Assistive device: Rolling walker (2 wheeled) Gait Pattern/deviations: Step-to pattern;Decreased step length - right;Decreased step length - left;Shuffle;Trunk flexed Gait velocity: decr   General Gait Details: min cues for sequence, posture and position from [employer]             Wheelchair Mobility    Modified Rankin (Stroke Patients Only)       Balance Overall balance assessment: Mild deficits observed, not formally tested                                          Cognition Arousal/Alertness: Awake/alert Behavior During  Therapy: WFL for tasks assessed/performed Overall Cognitive Status: Within Functional Limits for tasks assessed                                        Exercises Total Joint Exercises Ankle Circles/Pumps: AROM;Both;15 reps;Supine Quad Sets: AROM;Both;10 reps;Supine Heel Slides: AAROM;Left;Supine;20 reps Straight Leg Raises: AAROM;Left;Supine;20 reps Long Arc Quad: AROM;Left;10 reps;Seated Knee Flexion: AAROM;AROM;Left;10 reps;Seated    General Comments        Pertinent Vitals/Pain Pain Assessment: 0-10 Pain Score: 5  Pain Location: L knee Pain Descriptors / Indicators: Aching;Sore Pain Intervention(s): Limited activity within patient's tolerance;Monitored during session;Premedicated before session;Ice applied    Home Living                      Prior Function            PT Goals (current goals can now be found in the care plan section) Acute Rehab PT Goals Patient Stated Goal: Regain IND PT Goal Formulation: With patient Time For Goal Achievement: 08/22/19 Potential to Achieve Goals: Good Progress towards PT goals: Progressing toward goals    Frequency    7X/week      PT Plan Current plan remains appropriate    Co-evaluation              AM-PAC PT "6 Clicks" Mobility  Outcome Measure  Help needed turning from your back to your side while in a flat bed without using bedrails?: A Little Help needed moving from lying on your back to sitting on the side of a flat bed without using bedrails?: A Little Help needed moving to and from a bed to a chair (including a wheelchair)?: A Little Help needed standing up from a chair using your arms (e.g., wheelchair or bedside chair)?: A Little Help needed to walk in hospital room?: A Little Help needed climbing 3-5 steps with a railing? : A Little 6 Click Score: 18    End of Session Equipment Utilized During Treatment: Gait belt Activity Tolerance: Patient tolerated treatment well Patient  left: in bed;with call bell/phone within reach;with bed alarm set Nurse Communication: Mobility status PT Visit Diagnosis: Difficulty in walking, not elsewhere classified (R26.2)     Time: 1610-9604 PT Time Calculation (min) (ACUTE ONLY): 38 min  Charges:  $Gait Training: 8-22 mins $Therapeutic Exercise: 23-37 mins                     [place] Pager [phone] Office [phone]    Vlada Uriostegui 08/17/2019, 12:53 PM

## 2019-08-17 NOTE — Plan of Care (Signed)
Patient discharged home in stable condition. Waiting on her ride

## 2019-08-17 NOTE — Progress Notes (Signed)
    Subjective:  Patient reports pain as mild to moderate.  Denies N/V/CP/SOB. No c/o  Objective:   VITALS:   Vitals:   08/16/19 2001 08/16/19 2002 08/16/19 2046 08/17/19 0543  BP: (!) 179/71 (!) 168/69  (!) 175/68  Pulse: 80 79  79  Resp: 14   16  Temp: 98.6 F (37 C)   98.6 F (37 C)  TempSrc: Oral   Oral  SpO2: 99%  99% 99%  Weight:      Height:        NAD ABD soft Sensation intact distally Intact pulses distally Dorsiflexion/Plantar flexion intact Incision: dressing C/D/I Compartment soft   Lab Results  Component Value Date   WBC 10.0 08/17/2019   HGB 12.0 08/17/2019   HCT 34.2 (L) 08/17/2019   MCV 94.7 08/17/2019   PLT 175 08/17/2019   BMET    Component Value Date/Time   NA 133 (L) 08/17/2019 0328   NA 141 11/06/2017 0000   K 4.5 08/17/2019 0328   CL 101 08/17/2019 0328   CO2 26 08/17/2019 0328   GLUCOSE 239 (H) 08/17/2019 0328   BUN 26 (H) 08/17/2019 0328   BUN 16 06/21/2017 0000   CREATININE 0.88 08/17/2019 0328   CREATININE 1.01 (H) 01/25/2019 1602   CALCIUM 8.7 (L) 08/17/2019 0328   GFRNONAA >60 08/17/2019 0328   GFRNONAA 59 (L) 01/25/2019 1602   GFRAA >60 08/17/2019 0328   GFRAA 68 01/25/2019 1602     Assessment/Plan: 2 Days Post-Op   Active Problems:   S/P revision of total knee, left   WBAT with walker DVT ppx: Aspirin, SCDs, TEDS PO pain control PT/OT Dispo: d/c home after clears PT   Bertram Savin 08/17/2019, 10:33 AM   Rod Can, MD [phone] [place] [facility] now [facility]  [place] [address], [place], [place] [address] Phone: [phone] [email or web address] Facebook  [employer]

## 2019-08-19 ENCOUNTER — Encounter: Payer: Self-pay | Admitting: Physical Therapy

## 2019-08-19 ENCOUNTER — Other Ambulatory Visit: Payer: Self-pay

## 2019-08-19 ENCOUNTER — Ambulatory Visit: Payer: Medicare Other | Attending: Orthopedic Surgery | Admitting: Physical Therapy

## 2019-08-19 DIAGNOSIS — R262 Difficulty in walking, not elsewhere classified: Secondary | ICD-10-CM | POA: Diagnosis present

## 2019-08-19 DIAGNOSIS — M25562 Pain in left knee: Secondary | ICD-10-CM

## 2019-08-19 DIAGNOSIS — M25662 Stiffness of left knee, not elsewhere classified: Secondary | ICD-10-CM | POA: Insufficient documentation

## 2019-08-19 DIAGNOSIS — M6281 Muscle weakness (generalized): Secondary | ICD-10-CM | POA: Insufficient documentation

## 2019-08-19 NOTE — Therapy (Signed)
[facility] [address], [place], [place], [address] Phone: [phone]   Fax:  [phone]  Physical Therapy Evaluation  Patient Details  Name: [name] [name] MRN: [id number] Date of Birth: [birth year] Referring Provider (PT): Dr. Paralee Cancel   Encounter Date: 08/19/2019   PT End of Session - 08/19/19 1317    Visit Number 1    Date for PT Re-Evaluation 11/11/19    Authorization Type medicare    PT Start Time 1233    PT Stop Time 1318    PT Time Calculation (min) 45 min    Activity Tolerance Patient tolerated treatment well    Behavior During Therapy [facility] for tasks assessed/performed           Past Medical History:  Diagnosis Date  . Acute meniscal tear of knee    Left knee  . Alcohol problem drinking    rehab  . Anemia    menstrual related  . Anxiety   . Arthritis    right ankle-neuropathy  . Colon polyps   . Depression   . DM (diabetes mellitus), type 2 ([place]) 12/2009   type 2  . Evalluate for OSA (obstructive sleep apnea) 08/15/2013   No OSA on sleep study but may be underestimated.-never used cpap    . GERD (gastroesophageal reflux disease)    not currently taking.  Marland Kitchen Hyperlipidemia   . Hypertension   . Hypothyroidism   . Liver disease   . Neuromuscular disorder ([place])    neropathy bilateral feet.  . Thyroid disease     Past Surgical History:  Procedure Laterality Date  . ADENOIDECTOMY    . BREAST SURGERY Right    lumpectomy-benign  . [place]   1 time  . CHOLECYSTECTOMY    . INNER EAR SURGERY     left ear had tubes put in and removed, scar tissue built up/ loss of hearing in left ear for over a year  . MASS EXCISION Left 12/16/2013   Procedure: EXCISION LEFT  DISTAL THIGH MASS;  Surgeon: Mauri Pole, MD;  Location: [facility];  Service: Orthopedics;  Laterality: Left;  . right ankle surgery      x 2- no retained hardware  . right rotator cuff surgery      left side  .  TONSILLECTOMY    . TOTAL KNEE ARTHROPLASTY  2009   right  . TOTAL KNEE ARTHROPLASTY Left 05/05/2015   Procedure: LEFT TOTAL KNEE ARTHROPLASTY;  Surgeon: Paralee Cancel, MD;  Location: [facility];  Service: Orthopedics;  Laterality: Left;  . TOTAL KNEE REVISION Right 12/16/2013   Procedure: RIGHT TOTAL KNEE REVISION POLY EXCHANGE;  Surgeon: Mauri Pole, MD;  Location: [facility];  Service: Orthopedics;  Laterality: Right;  . TOTAL KNEE REVISION Left 08/15/2019   Procedure: LEFT KNEE REVISION;  Surgeon: Paralee Cancel, MD;  Location: [facility];  Service: Orthopedics;  Laterality: Left;  2 hrs  . TUBAL LIGATION  1987    There were no vitals filed for this visit.    Subjective Assessment - 08/19/19 1243    Subjective Just had surgery on Thursday.  I just took pain medicine before getting here so it hasn't kicked in yet.  I had original surgery in 2017 and had pain since, then they saw that the tibial portaion was loose    Pertinent History Lt TKA revision 08/15/19; chronic neck and back pain    Limitations Walking    Patient Stated Goals  reduce pain, be able to walk with normal gait no AD    Currently in Pain? Yes    Pain Score 6     Pain Location Knee    Pain Orientation Left    Pain Descriptors / Indicators Aching    Pain Type Surgical pain    Pain Onset More than a month ago    Pain Frequency Intermittent    Pain Relieving Factors ice, pain medicine, muscle relaxer    Multiple Pain Sites No              [facility] PT Assessment - 08/19/19 0001      Assessment   Medical Diagnosis Lt Knee TKA revision    Referring Provider (PT) Dr. Paralee Cancel    Onset Date/Surgical Date 08/15/19   pain since original surgery 2017     Prior Function   Level of Independence Independent    Vocation Retired    Leisure crafts, baking/cooking      Cognition   Overall Cognitive Status Within Functional Limits for tasks assessed      Observation/Other Assessments   Focus on Therapeutic Outcomes (FOTO)  96%  limited    Other Surveys  --    Oswestry Disability Index  --      ROM / Strength   AROM / PROM / Strength AROM;PROM;Strength      AROM   Overall AROM Comments Lt knee 11-74 deg    Cervical - Right Side Bend --    Cervical - Left Side Bend --    Cervical - Right Rotation --    Cervical - Left Rotation --      Strength   Strength Assessment Site Knee;Hip    Right/Left Hip Right;Left    Right Hip Flexion 4+/5    Right Hip ABduction 5/5    Left Hip Flexion 4-/5    Left Hip ABduction 4/5    Right/Left Knee Right;Left    Right Knee Flexion 5/5    Right Knee Extension 5/5    Left Knee Flexion 4+/5    Left Knee Extension 3-/5   not full ROM with pain no resistance added     Flexibility   Soft Tissue Assessment /Muscle Length yes    Hamstrings 70%      Palpation   Patella mobility --   stiff and boggy   Palpation comment edema present on left knee      Ambulation/Gait   Ambulation/Gait Yes    Ambulation/Gait Assistance 6: Modified independent (Device/Increase time)    Assistive device Rolling walker    Gait Pattern Step-to pattern                      Objective measurements completed on examination: See above findings.       [facility] Adult PT Treatment/Exercise - 08/19/19 0001      Self-Care   Self-Care Other Self-Care Comments    Other Self-Care Comments  reviewed proper step to gait with RW and adding some weight shift; reviewed current HEP - quad sets, SLR with sheet; knee ext and flexion ROM                    PT Short Term Goals - 08/19/19 1759      PT SHORT TERM GOAL #1   Title be independent in initial HEP    Time 4    Period Weeks    Status New    Target Date 09/16/19  PT SHORT TERM GOAL #2   Title demonstrates 90 deg AROM knee flexion and 5 or less deg knee ext for improved gait    Time 4    Period Weeks    Status New    Target Date 09/16/19      PT SHORT TERM GOAL #3   Title Be able to ambulate step through gait with  LRAD    Baseline step to with RW    Time 4    Period Weeks    Status New    Target Date 09/16/19             PT Long Term Goals - 08/19/19 1313      PT LONG TERM GOAL #1   Title be independent in advanced HEP    Time 12    Period Weeks    Status New    Target Date 11/11/19      PT LONG TERM GOAL #2   Title FOTO < or = to 53% limited    Baseline 96%    Time 12    Period Weeks    Status New    Target Date 11/11/19      PT LONG TERM GOAL #3   Title knee ROM 120-0 deg for improved gait and function    Time 12    Period Weeks    Status New    Target Date 11/11/19      PT LONG TERM GOAL #4   Title able to ambulate in community with 1/10 pain at most due to improved knee strength    Baseline has had pain since original knee surgery    Time 12    Period Weeks    Status New    Target Date 11/11/19      PT LONG TERM GOAL #5   Title Lt knee and hip strength 5/5 for ability to walk outside, stairs, and perform transfers without compensations    Time 12    Period Weeks    Status New    Target Date 11/11/19      PT LONG TERM GOAL #6   Title .Marland KitchenMarland Kitchen                  Plan - 08/19/19 1804    Clinical Impression Statement Pt arrives s/p TKA on left side revision of a previous surgery.  Pt FOTO is currently 96% limited.  Pt ambulats with RW and step to gait pattern.  She reports a lot of pain that was improved with vaso today.  Pt has waterproof bandage in tact and skin looking clear around the bandage.  Pt has LE weakness and decreased ROM as mentioned above.  Pt will benefit from skilled PT to address impairments and return to full function.    Personal Factors and Comorbidities Time since onset of injury/illness/exacerbation    Examination-Activity Limitations Locomotion Level;Stairs;Squat;Stand;Transfers    Examination-Participation Restrictions Community Activity    Stability/Clinical Decision Making Stable/Uncomplicated    Clinical Decision Making Low    Rehab  Potential Excellent    PT Frequency 3x / week    PT Duration 12 weeks    PT Treatment/Interventions ADLs/Self Care Home Management;Cryotherapy;Ultrasound;Traction;Moist Heat;Electrical Stimulation;Functional mobility training;Neuromuscular re-education;Therapeutic activities;Therapeutic exercise;Patient/family education;Manual techniques;Taping;Dry needling;Spinal Manipulations;Joint Manipulations    PT Next Visit Plan gait, ROM, knee strength, vaso    Consulted and Agree with Plan of Care Patient           Patient will benefit from skilled therapeutic  intervention in order to improve the following deficits and impairments:  Decreased activity tolerance, Decreased strength, Postural dysfunction, Improper body mechanics, Impaired flexibility, Pain, Increased muscle spasms, Decreased range of motion, Abnormal gait, Difficulty walking, Increased edema  Visit Diagnosis: Left knee pain, unspecified chronicity  Stiffness of left knee, not elsewhere classified  Muscle weakness (generalized)  Difficulty in walking, not elsewhere classified     Problem List Patient Active Problem List   Diagnosis Date Noted  . S/P revision of total knee, left 08/15/2019  . Preop cardiovascular exam 08/13/2019  . Osteoarthritis of ankle, right 01/29/2018  . Disorder of tendon of posterior tibial muscle 07/06/2017  . Sleep related headaches 09/07/2016  . Nightmares REM-sleep type 09/07/2016  . RLS (restless legs syndrome) 09/07/2016  . Snoring 09/07/2016  . Excessive daytime sleepiness 09/07/2016  . Migraine 07/28/2016  . Solitary pulmonary nodule 06/08/2016  . S/P knee replacement 05/05/2015  . Former smoker 07/10/2014  . Shortness of breath 06/07/2014  . Chest pain 06/07/2014  . Osteoarthritis 05/16/2014  . Alcoholic fatty liver 78/58/8502  . Chronic post-traumatic stress disorder (PTSD) 05/02/2014  . Sleep disorder 05/02/2014  . Family history of alcoholism 05/02/2014  . Macrocytosis  04/19/2014  . Fatigue 08/15/2013  . Thyroid nodule 09/17/2012  . Hypothyroid 07/21/2010  . Type II diabetes mellitus, well controlled ([place]) 06/23/2010  . Hypertension associated with diabetes ([place]) 06/23/2010  . Alcohol use disorder, severe, in sustained remission ([place]) 06/23/2010  . Hyperlipidemia associated with type 2 diabetes mellitus ([place]) 06/23/2010  . Morbid obesity ([place]) 06/23/2010  . Chronic depression 06/23/2010  . History of colonic polyps 04/12/2010    Jule Ser, PT 08/19/2019, 6:15 PM  [facility] [address], [place], [place], [address] Phone: [phone]   Fax:  [phone]  Name: [name] [name] MRN: [id number] Date of Birth: 06-29-1955

## 2019-08-21 ENCOUNTER — Encounter: Payer: Self-pay | Admitting: Physical Therapy

## 2019-08-21 ENCOUNTER — Ambulatory Visit: Payer: Medicare Other | Admitting: Physical Therapy

## 2019-08-21 ENCOUNTER — Other Ambulatory Visit: Payer: Self-pay

## 2019-08-21 DIAGNOSIS — M25562 Pain in left knee: Secondary | ICD-10-CM | POA: Diagnosis not present

## 2019-08-21 DIAGNOSIS — M6281 Muscle weakness (generalized): Secondary | ICD-10-CM

## 2019-08-21 DIAGNOSIS — M25662 Stiffness of left knee, not elsewhere classified: Secondary | ICD-10-CM

## 2019-08-21 DIAGNOSIS — R262 Difficulty in walking, not elsewhere classified: Secondary | ICD-10-CM

## 2019-08-21 NOTE — Patient Instructions (Signed)
Access Code: [email or web address] URL: [email or web address] Date: 08/21/2019 Prepared by: Almyra Free  Exercises Supine Quad Set - 3 x daily - 7 x weekly - 10 reps - 1 sets - 10 hold Supine Short Arc Quad - 3 x daily - 7 x weekly - 10 reps - 1 sets Supine Active Straight Leg Raise - 3 x daily - 7 x weekly - 10 reps - 1 sets Supine Heel Slide with Strap - 3 x daily - 7 x weekly - 10 reps - 1 sets - 10 hold Seated Knee Flexion Extension AROM - 3 x daily - 7 x weekly - 10 reps - 1 sets Seated Long Arc Quad - 3 x daily - 7 x weekly - 10 reps - 1 sets Seated Hamstring Stretch - 2 x daily - 7 x weekly - 3 reps - 1 sets - 30-60 sec hold Seated Hamstring Stretch with Chair - 2 x daily - 7 x weekly - 3 reps - 1 sets - 30-60 sec hold

## 2019-08-21 NOTE — Therapy (Signed)
[facility] [address], [place], [place], [address] Phone: [phone]   Fax:  [phone]  Physical Therapy Treatment  Patient Details  Name: [name] [name] MRN: [id number] Date of Birth: [birth year] Referring Provider (PT): Dr. Paralee Cancel   Encounter Date: 08/21/2019   PT End of Session - 08/21/19 0935    Visit Number 2    Date for PT Re-Evaluation 11/11/19    Authorization Type medicare    Authorization - Visit Number 15    Authorization - Number of Visits 30    PT Start Time 0930    PT Stop Time 1022    PT Time Calculation (min) 52 min    Activity Tolerance Patient tolerated treatment well    Behavior During Therapy [facility] for tasks assessed/performed           Past Medical History:  Diagnosis Date  . Acute meniscal tear of knee    Left knee  . Alcohol problem drinking    rehab  . Anemia    menstrual related  . Anxiety   . Arthritis    right ankle-neuropathy  . Colon polyps   . Depression   . DM (diabetes mellitus), type 2 ([place]) 12/2009   type 2  . Evalluate for OSA (obstructive sleep apnea) 08/15/2013   No OSA on sleep study but may be underestimated.-never used cpap    . GERD (gastroesophageal reflux disease)    not currently taking.  Marland Kitchen Hyperlipidemia   . Hypertension   . Hypothyroidism   . Liver disease   . Neuromuscular disorder ([place])    neropathy bilateral feet.  . Thyroid disease     Past Surgical History:  Procedure Laterality Date  . ADENOIDECTOMY    . BREAST SURGERY Right    lumpectomy-benign  . [place]   1 time  . CHOLECYSTECTOMY    . INNER EAR SURGERY     left ear had tubes put in and removed, scar tissue built up/ loss of hearing in left ear for over a year  . MASS EXCISION Left 12/16/2013   Procedure: EXCISION LEFT  DISTAL THIGH MASS;  Surgeon: Mauri Pole, MD;  Location: [facility];  Service: Orthopedics;  Laterality: Left;  . right ankle surgery      x  2- no retained hardware  . right rotator cuff surgery      left side  . TONSILLECTOMY    . TOTAL KNEE ARTHROPLASTY  2009   right  . TOTAL KNEE ARTHROPLASTY Left 05/05/2015   Procedure: LEFT TOTAL KNEE ARTHROPLASTY;  Surgeon: Paralee Cancel, MD;  Location: [facility];  Service: Orthopedics;  Laterality: Left;  . TOTAL KNEE REVISION Right 12/16/2013   Procedure: RIGHT TOTAL KNEE REVISION POLY EXCHANGE;  Surgeon: Mauri Pole, MD;  Location: [facility];  Service: Orthopedics;  Laterality: Right;  . TOTAL KNEE REVISION Left 08/15/2019   Procedure: LEFT KNEE REVISION;  Surgeon: Paralee Cancel, MD;  Location: [facility];  Service: Orthopedics;  Laterality: Left;  2 hrs  . TUBAL LIGATION  1987    There were no vitals filed for this visit.   Subjective Assessment - 08/21/19 0936    Subjective Patient reporting 6/10 pain today in left knee.    Pertinent History Lt TKA revision 08/15/19; chronic neck and back pain    Patient Stated Goals reduce pain, be able to walk with normal gait no AD    Currently in Pain? Yes  Pain Score 6     Pain Location Knee    Pain Orientation Left    Pain Descriptors / Indicators Aching    Pain Type Surgical pain                             [facility] Adult PT Treatment/Exercise - 08/21/19 0001      Exercises   Exercises Knee/Hip      Neck Exercises: Machines for Strengthening   Nustep --      Knee/Hip Exercises: Aerobic   Nustep L1 x 6 min for ROM      Knee/Hip Exercises: Standing   Gait Training weight shifting in walker      Knee/Hip Exercises: Seated   Long Arc Quad Strengthening;Left;20 reps    Heel Slides AAROM;15 reps      Knee/Hip Exercises: Supine   Quad Sets Strengthening;Left;1 set;10 reps    Quad Sets Limitations roll under ankle    Short Arc Quad Sets Strengthening;Left;1 set;10 reps    Short Arc Quad Sets Limitations ball under knee    Heel Slides AAROM;Left;20 reps    Heel Slides Limitations with strap and slider for assist      Straight Leg Raises AAROM;Strengthening;Left;10 reps    Straight Leg Raises Limitations using strap for assist      Modalities   Modalities Vasopneumatic      Vasopneumatic   Number Minutes Vasopneumatic  15 minutes    Vasopnuematic Location  Knee    Vasopneumatic Pressure Medium    Vasopneumatic Temperature  3 snowflakes                  PT Education - 08/21/19 1007    Education Details HEP    Person(s) Educated Patient    Methods Explanation;Demonstration;Handout    Comprehension Verbalized understanding;Returned demonstration            PT Short Term Goals - 08/19/19 1759      PT SHORT TERM GOAL #1   Title be independent in initial HEP    Time 4    Period Weeks    Status New    Target Date 09/16/19      PT SHORT TERM GOAL #2   Title demonstrates 90 deg AROM knee flexion and 5 or less deg knee ext for improved gait    Time 4    Period Weeks    Status New    Target Date 09/16/19      PT SHORT TERM GOAL #3   Title Be able to ambulate step through gait with LRAD    Baseline step to with RW    Time 4    Period Weeks    Status New    Target Date 09/16/19             PT Long Term Goals - 08/19/19 1313      PT LONG TERM GOAL #1   Title be independent in advanced HEP    Time 12    Period Weeks    Status New    Target Date 11/11/19      PT LONG TERM GOAL #2   Title FOTO < or = to 53% limited    Baseline 96%    Time 12    Period Weeks    Status New    Target Date 11/11/19      PT LONG TERM GOAL #3   Title knee ROM 120-0 deg  for improved gait and function    Time 12    Period Weeks    Status New    Target Date 11/11/19      PT LONG TERM GOAL #4   Title able to ambulate in community with 1/10 pain at most due to improved knee strength    Baseline has had pain since original knee surgery    Time 12    Period Weeks    Status New    Target Date 11/11/19      PT LONG TERM GOAL #5   Title Lt knee and hip strength 5/5 for ability to walk  outside, stairs, and perform transfers without compensations    Time 12    Period Weeks    Status New    Target Date 11/11/19      PT LONG TERM GOAL #6   Title .Marland KitchenMarland Kitchen                 Plan - 08/21/19 1008    Clinical Impression Statement Patient did very well with TE today. Phase 1 exercises issued for HEP.    PT Treatment/Interventions ADLs/Self Care Home Management;Cryotherapy;Ultrasound;Traction;Moist Heat;Electrical Stimulation;Functional mobility training;Neuromuscular re-education;Therapeutic activities;Therapeutic exercise;Patient/family education;Manual techniques;Taping;Dry needling;Spinal Manipulations;Joint Manipulations    PT Next Visit Plan Review knee ext stretches from HEP; gait, ROM, knee strength, vaso    PT Home Exercise Plan [email or web address] (new code for knee)           Patient will benefit from skilled therapeutic intervention in order to improve the following deficits and impairments:  Decreased activity tolerance, Decreased strength, Postural dysfunction, Improper body mechanics, Impaired flexibility, Pain, Increased muscle spasms, Decreased range of motion, Abnormal gait, Difficulty walking, Increased edema  Visit Diagnosis: Left knee pain, unspecified chronicity  Stiffness of left knee, not elsewhere classified  Muscle weakness (generalized)  Difficulty in walking, not elsewhere classified     Problem List Patient Active Problem List   Diagnosis Date Noted  . S/P revision of total knee, left 08/15/2019  . Preop cardiovascular exam 08/13/2019  . Osteoarthritis of ankle, right 01/29/2018  . Disorder of tendon of posterior tibial muscle 07/06/2017  . Sleep related headaches 09/07/2016  . Nightmares REM-sleep type 09/07/2016  . RLS (restless legs syndrome) 09/07/2016  . Snoring 09/07/2016  . Excessive daytime sleepiness 09/07/2016  . Migraine 07/28/2016  . Solitary pulmonary nodule 06/08/2016  . S/P knee replacement 05/05/2015  . Former smoker  07/10/2014  . Shortness of breath 06/07/2014  . Chest pain 06/07/2014  . Osteoarthritis 05/16/2014  . Alcoholic fatty liver 11/91/4782  . Chronic post-traumatic stress disorder (PTSD) 05/02/2014  . Sleep disorder 05/02/2014  . Family history of alcoholism 05/02/2014  . Macrocytosis 04/19/2014  . Fatigue 08/15/2013  . Thyroid nodule 09/17/2012  . Hypothyroid 07/21/2010  . Type II diabetes mellitus, well controlled ([place]) 06/23/2010  . Hypertension associated with diabetes ([place]) 06/23/2010  . Alcohol use disorder, severe, in sustained remission ([place]) 06/23/2010  . Hyperlipidemia associated with type 2 diabetes mellitus ([place]) 06/23/2010  . Morbid obesity ([place]) 06/23/2010  . Chronic depression 06/23/2010  . History of colonic polyps 04/12/2010    Madelyn Flavors PT 08/21/2019, 10:12 AM  [facility] [address], [place], [place], [address] Phone: [phone]   Fax:  [phone]  Name: Ketsia Linebaugh MRN: [id number] Date of Birth: November 16, 1955

## 2019-08-21 NOTE — Discharge Summary (Signed)
Physician Discharge Summary   Patient ID: [name] [name] MRN: [id number] DOB/AGE: 1955/05/06 64 y.o.  Admit date: 08/15/2019 Discharge date: 08/17/2019  Primary Diagnosis:  Failed left total knee arthroplasty, tibial component.  Admission Diagnoses:  Past Medical History:  Diagnosis Date  . Acute meniscal tear of knee    Left knee  . Alcohol problem drinking    rehab  . Anemia    menstrual related  . Anxiety   . Arthritis    right ankle-neuropathy  . Colon polyps   . Depression   . DM (diabetes mellitus), type 2 ([place]) 12/2009   type 2  . Evalluate for OSA (obstructive sleep apnea) 08/15/2013   No OSA on sleep study but may be underestimated.-never used cpap    . GERD (gastroesophageal reflux disease)    not currently taking.  Marland Kitchen Hyperlipidemia   . Hypertension   . Hypothyroidism   . Liver disease   . Neuromuscular disorder ([place])    neropathy bilateral feet.  . Thyroid disease    Discharge Diagnoses:   Active Problems:   S/P revision of total knee, left  Estimated body mass index is 41.9 kg/m as calculated from the following:   Height as of this encounter: 5' 6" (1.676 m).   Weight as of this encounter: 117.8 kg.  Procedure:  Procedure(s) (LRB): LEFT KNEE REVISION (Left)   Consults: None  HPI: The patient is a 64 year old female status post an index left total knee arthroplasty in 2017.  She has had problems with the knee for some time.  We had reviewed her presentation, ruled out infection.  Bone scan had indicated  probable and likely loosening of the tibial component.  This confirmed with radiographs raising suspicion of some collapse in varus.  We discussed the risks of infection, DVT, component failure, need for future surgeries.  We discussed weight as a  modifiable risk factor.  Consent was obtained for benefit of pain relief.  Please note that her BMI was 43 at the time of evaluation and 41.9 at the time of surgery.  Consent was obtained for benefit of pain  relief.  Laboratory Data: Admission on 08/15/2019, Discharged on 08/17/2019  Component Date Value Ref Range Status  . Glucose-Capillary 08/15/2019 131* 70 - 99 mg/dL Final   Glucose reference range applies only to samples taken after fasting for at least 8 hours.  . SURGICAL PATHOLOGY 08/15/2019    Final-Edited                   Value:SURGICAL PATHOLOGY CASE: [id number] PATIENT: [name] [name]     Clinical History: Failed left knee, loose tibial component ([facility])     FINAL MICROSCOPIC DIAGNOSIS:  A. HARDWARE, TIBIAL COMPONENT: -Gross diagnosis only: Medical device, clinically tibial component hardware.  GROSS DESCRIPTION:  Received fresh is a 6.5 x 4.2 x 3 cm white plastic card implant with a flattened surface and opposite concave surface.  Specimen grade with "Peculiar, [id number]".  Also received in the container is a 6.5 x 4 x 4 cm silver metallic implant with one flattened surface and a cold-like projection at the opposite surface.  Specimen engraved with "[id number], [id number], SZ3" adherent tissue is not seen.  Specimen for gross only.  Specimen retained in pathology department.  (AK 08/15/2019)    Final Diagnosis performed by Claudette Laws, MD.   Electronically signed 08/16/2019 Technical and / or Professional components performed at [facility], [place] [address], [place], [place] [address].  Immunohistochemistry Technical component (if applicable) was performed at [facility]. [address], [place], [place], [place] [address].   IMMUNOHISTOCHEMISTRY DISCLAIMER (if applicable): Some of these immunohistochemical stains may have been developed and the performance characteristics determine by [facility]. Some may not have been cleared or approved by the U.S. Food and Drug Administration. The FDA has determined that such clearance or approval is not necessary. This test  is used for clinical purposes. It should not be regarded as investigational or for research. This laboratory is certified under the [place] (CLIA-88) as qualified to perform high complexity clinical laboratory testing.  The controls stained appropriately.   . Glucose-Capillary 08/15/2019 213* 70 - 99 mg/dL Final   Glucose reference range applies only to samples taken after fasting for at least 8 hours.  . Glucose-Capillary 08/15/2019 336* 70 - 99 mg/dL Final   Glucose reference range applies only to samples taken after fasting for at least 8 hours.  . WBC 08/16/2019 8.7  4.0 - 10.5 K/uL Final  . RBC 08/16/2019 4.28  3.87 - 5.11 MIL/uL Final  . Hemoglobin 08/16/2019 14.0  12.0 - 15.0 g/dL Final  . HCT 08/16/2019 39.9  36 - 46 % Final  . MCV 08/16/2019 93.2  80.0 - 100.0 fL Final  . MCH 08/16/2019 32.7  26.0 - 34.0 pg Final  . MCHC 08/16/2019 35.1  30.0 - 36.0 g/dL Final  . RDW 08/16/2019 13.1  11.5 - 15.5 % Final  . Platelets 08/16/2019 154  150 - 400 K/uL Final  . nRBC 08/16/2019 0.0  0.0 - 0.2 % Final   Performed at [facility], [place] [address] [place], [place] [address]  . Sodium 08/16/2019 136  135 - 145 mmol/L Final  . Potassium 08/16/2019 4.7  3.5 - 5.1 mmol/L Final  . Chloride 08/16/2019 103  98 - 111 mmol/L Final  . CO2 08/16/2019 22  22 - 32 mmol/L Final  . Glucose, Bld 08/16/2019 317* 70 - 99 mg/dL Final   Glucose reference range applies only to samples taken after fasting for at least 8 hours.  . BUN 08/16/2019 22  8 - 23 mg/dL Final  . Creatinine, Ser 08/16/2019 1.07* 0.44 - 1.00 mg/dL Final  . Calcium 08/16/2019 8.9  8.9 - 10.3 mg/dL Final  . GFR calc non Af Amer 08/16/2019 54* >60 mL/min Final  . GFR calc Af Amer 08/16/2019 >60  >60 mL/min Final  . Anion gap 08/16/2019 11  5 - 15 Final   Performed at [facility], [place] [address] [place], [place] [address]  . Glucose-Capillary 08/15/2019  367* 70 - 99 mg/dL Final   Glucose reference range applies only to samples taken after fasting for at least 8 hours.  . Glucose-Capillary 08/16/2019 236* 70 - 99 mg/dL Final   Glucose reference range applies only to samples taken after fasting for at least 8 hours.  . Glucose-Capillary 08/16/2019 276* 70 - 99 mg/dL Final   Glucose reference range applies only to samples taken after fasting for at least 8 hours.  . Glucose-Capillary 08/16/2019 270* 70 - 99 mg/dL Final   Glucose reference range applies only to samples taken after fasting for at least 8 hours.  . WBC 08/17/2019 10.0  4.0 - 10.5 K/uL Final  . RBC 08/17/2019 3.61* 3.87 - 5.11 MIL/uL Final  . Hemoglobin 08/17/2019 12.0  12.0 - 15.0 g/dL  Final  . HCT 08/17/2019 34.2* 36 - 46 % Final  . MCV 08/17/2019 94.7  80.0 - 100.0 fL Final  . MCH 08/17/2019 33.2  26.0 - 34.0 pg Final  . MCHC 08/17/2019 35.1  30.0 - 36.0 g/dL Final  . RDW 08/17/2019 13.3  11.5 - 15.5 % Final  . Platelets 08/17/2019 175  150 - 400 K/uL Final  . nRBC 08/17/2019 0.0  0.0 - 0.2 % Final   Performed at [facility], [place] [address], [place], [place] [address]  . Sodium 08/17/2019 133* 135 - 145 mmol/L Final  . Potassium 08/17/2019 4.5  3.5 - 5.1 mmol/L Final  . Chloride 08/17/2019 101  98 - 111 mmol/L Final  . CO2 08/17/2019 26  22 - 32 mmol/L Final  . Glucose, Bld 08/17/2019 239* 70 - 99 mg/dL Final   Glucose reference range applies only to samples taken after fasting for at least 8 hours.  . BUN 08/17/2019 26* 8 - 23 mg/dL Final  . Creatinine, Ser 08/17/2019 0.88  0.44 - 1.00 mg/dL Final  . Calcium 08/17/2019 8.7* 8.9 - 10.3 mg/dL Final  . GFR calc non Af Amer 08/17/2019 >60  >60 mL/min Final  . GFR calc Af Amer 08/17/2019 >60  >60 mL/min Final  . Anion gap 08/17/2019 6  5 - 15 Final   Performed at [facility], [place] [address], [place], [place] [address]  . Glucose-Capillary 08/16/2019 276* 70 - 99 mg/dL Final   Glucose  reference range applies only to samples taken after fasting for at least 8 hours.  . Glucose-Capillary 08/17/2019 136* 70 - 99 mg/dL Final   Glucose reference range applies only to samples taken after fasting for at least 8 hours.  Hospital Outpatient Visit on 08/12/2019  Component Date Value Ref Range Status  . SARS Coronavirus 2 08/12/2019 NEGATIVE  NEGATIVE Final   Comment: (NOTE) SARS-CoV-2 target nucleic acids are NOT DETECTED.  The SARS-CoV-2 RNA is generally detectable in upper and lower respiratory specimens during the acute phase of infection. Negative results do not preclude SARS-CoV-2 infection, do not rule out co-infections with other pathogens, and should not be used as the sole basis for treatment or other patient management decisions. Negative results must be combined with clinical observations, patient history, and epidemiological information. The expected result is Negative.  Fact Sheet for Patients: SugarRoll.be  Fact Sheet for Healthcare Providers: https://www.woods-mathews.com/  This test is not yet approved or cleared by the [place] FDA and  has been authorized for detection and/or diagnosis of SARS-CoV-2 by FDA under an Emergency Use Authorization (EUA). This EUA will remain  in effect (meaning this test can be used) for the duration of the COVID-19 declaration under Se                          ction 564(b)(1) of the Act, 21 U.S.C. section 360bbb-3(b)(1), unless the authorization is terminated or revoked sooner.  Performed at [facility], [place] [address], [place], [place] [address]   Hospital Outpatient Visit on 08/07/2019  Component Date Value Ref Range Status  . Glucose-Capillary 08/07/2019 176* 70 - 99 mg/dL Final   Glucose reference range applies only to samples taken after fasting for at least 8 hours.  . WBC 08/07/2019 5.1  4.0 - 10.5 K/uL Final  . RBC 08/07/2019 4.40  3.87 - 5.11 MIL/uL Final  .  Hemoglobin 08/07/2019 14.3  12.0 - 15.0  g/dL Final  . HCT 08/07/2019 42.1  36 - 46 % Final  . MCV 08/07/2019 95.7  80.0 - 100.0 fL Final  . MCH 08/07/2019 32.5  26.0 - 34.0 pg Final  . MCHC 08/07/2019 34.0  30.0 - 36.0 g/dL Final  . RDW 08/07/2019 13.2  11.5 - 15.5 % Final  . Platelets 08/07/2019 205  150 - 400 K/uL Final  . nRBC 08/07/2019 0.0  0.0 - 0.2 % Final   Performed at [facility], [place] [address], [place], [place] [address]  . Sodium 08/07/2019 140  135 - 145 mmol/L Final  . Potassium 08/07/2019 4.3  3.5 - 5.1 mmol/L Final  . Chloride 08/07/2019 107  98 - 111 mmol/L Final  . CO2 08/07/2019 23  22 - 32 mmol/L Final  . Glucose, Bld 08/07/2019 172* 70 - 99 mg/dL Final   Glucose reference range applies only to samples taken after fasting for at least 8 hours.  . BUN 08/07/2019 21  8 - 23 mg/dL Final  . Creatinine, Ser 08/07/2019 1.09* 0.44 - 1.00 mg/dL Final  . Calcium 08/07/2019 9.5  8.9 - 10.3 mg/dL Final  . Total Protein 08/07/2019 6.7  6.5 - 8.1 g/dL Final  . Albumin 08/07/2019 3.7  3.5 - 5.0 g/dL Final  . AST 08/07/2019 23  15 - 41 U/L Final  . ALT 08/07/2019 20  0 - 44 U/L Final  . Alkaline Phosphatase 08/07/2019 62  38 - 126 U/L Final  . Total Bilirubin 08/07/2019 1.1  0.3 - 1.2 mg/dL Final  . GFR calc non Af Amer 08/07/2019 54* >60 mL/min Final  . GFR calc Af Amer 08/07/2019 >60  >60 mL/min Final  . Anion gap 08/07/2019 10  5 - 15 Final   Performed at [facility], [place] [address], [place], [place] [address]  . Hgb A1c MFr Bld 08/07/2019 6.7* 4.8 - 5.6 % Final   Comment: (NOTE)         Prediabetes: 5.7 - 6.4         Diabetes: >6.4         Glycemic control for adults with diabetes: <7.0   . Mean Plasma Glucose 08/07/2019 146  mg/dL Final   Comment: (NOTE) Performed At: [facility] [place], [place] [id number] Rush Farmer MD YD:[id number]   . MRSA, PCR 08/07/2019 NEGATIVE  NEGATIVE Final  .  Staphylococcus aureus 08/07/2019 NEGATIVE  NEGATIVE Final   Comment: (NOTE) The Xpert SA Assay (FDA approved for NASAL specimens in patients 62 years of age and older), is one component of a comprehensive surveillance program. It is not intended to diagnose infection nor to guide or monitor treatment. Performed at [facility], [place] [address], [place],  [address]   . ABO/RH(D) 08/07/2019 A POS   Final  . Antibody Screen 08/07/2019 NEG   Final  . Sample Expiration 08/07/2019 08/18/2019,2359   Final  . Extend sample reason 08/07/2019    Final                   Value:NO TRANSFUSIONS OR PREGNANCY IN THE PAST 3 MONTHS Performed at [facility] [place] [address] [place],  [address]      X-Rays:No results found.  EKG: Orders placed or performed during the hospital encounter of 08/07/19  . EKG 12 lead  . EKG 12 lead  . EKG 12-Lead  . EKG 12-Lead     Hospital Course: Curt Bears  [place] is a 64 y.o. who was admitted to [facility]. They were brought to the operating room on 08/15/2019 and underwent Procedure(s): [place].  Patient tolerated the procedure well and was later transferred to the recovery room and then to the orthopaedic floor for postoperative care. They were given PO and IV analgesics for pain control following their surgery. They were given 24 hours of postoperative antibiotics of  Anti-infectives (From admission, onward)   Start     Dose/Rate Route Frequency Ordered Stop   08/15/19 1400  ceFAZolin (ANCEF) IVPB 2g/100 mL premix        2 g 200 mL/hr over 30 Minutes Intravenous Every 6 hours 08/15/19 1138 08/15/19 2123   08/15/19 0600  ceFAZolin (ANCEF) IVPB 2g/100 mL premix        2 g 200 mL/hr over 30 Minutes Intravenous On call to O.R. 08/15/19 9924 08/15/19 0815     and started on DVT prophylaxis in the form of Aspirin.   PT and OT were ordered for total joint protocol. Discharge planning consulted to help  with postop disposition and equipment needs. Patient had a good night on the evening of surgery. They started to get up OOB with therapy on POD #0.  Worked with therapy on POD #1 and was progressing well. Continued to work with therapy into POD #2. Pt was seen during rounds on day two and was ready to go home pending progress with therapy. Pt worked with therapy for two additional sessions and was meeting their goals. She was discharged to home later that day in stable condition.  Diet: Regular diet Activity: WBAT Follow-up: in 2 weeks Disposition: Home Discharged Condition: good   Discharge Instructions    Call MD / Call 911   Complete by: As directed    If you experience chest pain or shortness of breath, CALL 911 and be transported to the hospital emergency room.  If you develope a fever above 101 F, pus (white drainage) or increased drainage or redness at the wound, or calf pain, call your surgeon's office.   Call MD / Call 911   Complete by: As directed    If you experience chest pain or shortness of breath, CALL 911 and be transported to the hospital emergency room.  If you develope a fever above 101 F, pus (white drainage) or increased drainage or redness at the wound, or calf pain, call your surgeon's office.   Change dressing   Complete by: As directed    Maintain surgical dressing until follow up in the clinic. If the edges start to pull up, may reinforce with tape. If the dressing is no longer working, may remove and cover with gauze and tape, but must keep the area dry and clean.  Call with any questions or concerns.   Constipation Prevention   Complete by: As directed    Drink plenty of fluids.  Prune juice may be helpful.  You may use a stool softener, such as Colace (over the counter) 100 mg twice a day.  Use MiraLax (over the counter) for constipation as needed.   Constipation Prevention   Complete by: As directed    Drink plenty of fluids.  Prune juice may be helpful.  You  may use a stool softener, such as Colace (over the counter) 100 mg twice a day.  Use MiraLax (over the counter) for constipation as needed.   Diet - low sodium heart healthy   Complete by: As directed  Diet - low sodium heart healthy   Complete by: As directed    Discharge instructions   Complete by: As directed    Maintain surgical dressing until follow up in the clinic. If the edges start to pull up, may reinforce with tape. If the dressing is no longer working, may remove and cover with gauze and tape, but must keep the area dry and clean.  Follow up in 2 weeks at [facility]. Call with any questions or concerns.   Do not put a pillow under the knee. Place it under the heel.   Complete by: As directed    Driving restrictions   Complete by: As directed    No driving for 6 weeks   Increase activity slowly as tolerated   Complete by: As directed    Weight bearing as tolerated with assist device (walker, cane, etc) as directed, use it as long as suggested by your surgeon or therapist, typically at least 4-6 weeks.   Increase activity slowly as tolerated   Complete by: As directed    Lifting restrictions   Complete by: As directed    No lifting for 6 weeks   TED hose   Complete by: As directed    Use stockings (TED hose) for 2 weeks on both leg(s).  You may remove them at night for sleeping.   TED hose   Complete by: As directed    Use stockings (TED hose) for 2 weeks on both leg(s).  You may remove them at night for sleeping.     Allergies as of 08/17/2019      Reactions   Adhesive [tape] Other (See Comments)   blisters   Nitrofurantoin    Diffuse rash   Other Other (See Comments)   (Neoprene)--causes blisters.       Medication List    STOP taking these medications   diclofenac sodium 1 % Gel Commonly known as: VOLTAREN     TAKE these medications   albuterol 108 (90 Base) MCG/ACT inhaler Commonly known as: VENTOLIN HFA Inhale 2 puffs into the lungs every 6 (six) hours  as needed for wheezing or shortness of breath.   aspirin 81 MG chewable tablet Commonly known as: Aspirin Childrens Chew 1 tablet (81 mg total) by mouth 2 (two) times daily. Take for 4 weeks, then resume regular dose.   benazepril 20 MG tablet Commonly known as: LOTENSIN Take 1 tablet (20 mg total) by mouth daily.   blood glucose meter kit and supplies Kit Dispense based on patient and insurance preference. Use up to four times daily as directed. DX E11.9   buPROPion 150 MG 24 hr tablet Commonly known as: WELLBUTRIN XL Take 1 tablet (150 mg total) by mouth daily.   docusate sodium 100 MG capsule Commonly known as: Colace Take 1 capsule (100 mg total) by mouth 2 (two) times daily.   escitalopram 20 MG tablet Commonly known as: LEXAPRO Take 1 tablet (20 mg total) by mouth daily.   ferrous sulfate 325 (65 FE) MG tablet Commonly known as: FerrouSul Take 1 tablet (325 mg total) by mouth 3 (three) times daily with meals for 14 days.   gabapentin 100 MG capsule Commonly known as: NEURONTIN Take 100 mg by mouth 2 (two) times daily.   glimepiride 4 MG tablet Commonly known as: AMARYL Take 1 tablet (4 mg total) by mouth daily before breakfast.   glucose blood test strip Commonly known as: OneTouch Verio USE TO CHECK BLOOD SUGAR DAILY AND PRN  HYDROcodone-acetaminophen 7.5-325 MG tablet Commonly known as: Norco Take 1-2 tablets by mouth every 4 (four) hours as needed for moderate pain.   levothyroxine 88 MCG tablet Commonly known as: SYNTHROID Take 1 tablet (88 mcg total) by mouth daily.   metFORMIN 500 MG 24 hr tablet Commonly known as: Glucophage XR Take 2 tablets (1,000 mg total) by mouth in the morning and at bedtime.   polyethylene glycol 17 g packet Commonly known as: MIRALAX / GLYCOLAX Take 17 g by mouth 2 (two) times daily.   simvastatin 20 MG tablet Commonly known as: ZOCOR Take 1 tablet (20 mg total) by mouth at bedtime. TAKE 1 TABLET BY MOUTH EVERYDAY AT  BEDTIME   tiZANidine 4 MG capsule Commonly known as: ZANAFLEX Take 1 capsule (4 mg total) by mouth 3 (three) times daily as needed for muscle spasms. What changed:   when to take this  reasons to take this            Discharge Care Instructions  (From admission, onward)         Start     Ordered   08/16/19 0000  Change dressing       Comments: Maintain surgical dressing until follow up in the clinic. If the edges start to pull up, may reinforce with tape. If the dressing is no longer working, may remove and cover with gauze and tape, but must keep the area dry and clean.  Call with any questions or concerns.   08/16/19 0756          Follow-up Information    Paralee Cancel, MD. Schedule an appointment as soon as possible for a visit in 2 weeks.   Specialty: Orthopedic Surgery Contact information: [address] [place] [address] [phone]               Signed: Griffith Citron, PA-C Orthopedic Surgery 08/21/2019, 7:02 AM

## 2019-08-27 ENCOUNTER — Ambulatory Visit: Payer: Medicare Other | Attending: Orthopedic Surgery | Admitting: Physical Therapy

## 2019-08-27 ENCOUNTER — Other Ambulatory Visit: Payer: Self-pay

## 2019-08-27 ENCOUNTER — Encounter: Payer: Self-pay | Admitting: Physical Therapy

## 2019-08-27 DIAGNOSIS — R262 Difficulty in walking, not elsewhere classified: Secondary | ICD-10-CM | POA: Insufficient documentation

## 2019-08-27 DIAGNOSIS — M25562 Pain in left knee: Secondary | ICD-10-CM

## 2019-08-27 DIAGNOSIS — M25662 Stiffness of left knee, not elsewhere classified: Secondary | ICD-10-CM | POA: Diagnosis present

## 2019-08-27 DIAGNOSIS — M6281 Muscle weakness (generalized): Secondary | ICD-10-CM | POA: Diagnosis present

## 2019-08-27 NOTE — Therapy (Signed)
[facility] [address] [place], [place], [address] Phone: [phone]   Fax:  [phone]  Physical Therapy Treatment  Patient Details  Name: [name] [name] MRN: [id number] Date of Birth: 03-16-1955 Referring Provider (PT): Dr. Paralee Cancel   Encounter Date: 08/27/2019   PT End of Session - 08/27/19 0932    Visit Number 3    Date for PT Re-Evaluation 11/11/19    Authorization Type medicare    Authorization - Visit Number 16    Authorization - Number of Visits 30    PT Start Time 0930    PT Stop Time 1028    PT Time Calculation (min) 58 min    Activity Tolerance Patient tolerated treatment well;No increased pain    Behavior During Therapy WFL for tasks assessed/performed           Past Medical History:  Diagnosis Date  . Acute meniscal tear of knee    Left knee  . Alcohol problem drinking    rehab  . Anemia    menstrual related  . Anxiety   . Arthritis    right ankle-neuropathy  . Colon polyps   . Depression   . DM (diabetes mellitus), type 2 ([place]) 12/2009   type 2  . Evalluate for OSA (obstructive sleep apnea) 08/15/2013   No OSA on sleep study but may be underestimated.-never used cpap    . GERD (gastroesophageal reflux disease)    not currently taking.  Marland Kitchen Hyperlipidemia   . Hypertension   . Hypothyroidism   . Liver disease   . Neuromuscular disorder ([place])    neropathy bilateral feet.  . Thyroid disease     Past Surgical History:  Procedure Laterality Date  . ADENOIDECTOMY    . BREAST SURGERY Right    lumpectomy-benign  . [place]   1 time  . CHOLECYSTECTOMY    . INNER EAR SURGERY     left ear had tubes put in and removed, scar tissue built up/ loss of hearing in left ear for over a year  . MASS EXCISION Left 12/16/2013   Procedure: EXCISION LEFT  DISTAL THIGH MASS;  Surgeon: Mauri Pole, MD;  Location: [facility];  Service: Orthopedics;  Laterality: Left;  . right ankle  surgery      x 2- no retained hardware  . right rotator cuff surgery      left side  . TONSILLECTOMY    . TOTAL KNEE ARTHROPLASTY  2009   right  . TOTAL KNEE ARTHROPLASTY Left 05/05/2015   Procedure: LEFT TOTAL KNEE ARTHROPLASTY;  Surgeon: Paralee Cancel, MD;  Location: [facility];  Service: Orthopedics;  Laterality: Left;  . TOTAL KNEE REVISION Right 12/16/2013   Procedure: RIGHT TOTAL KNEE REVISION POLY EXCHANGE;  Surgeon: Mauri Pole, MD;  Location: [facility];  Service: Orthopedics;  Laterality: Right;  . TOTAL KNEE REVISION Left 08/15/2019   Procedure: LEFT KNEE REVISION;  Surgeon: Paralee Cancel, MD;  Location: [facility];  Service: Orthopedics;  Laterality: Left;  2 hrs  . TUBAL LIGATION  1987    There were no vitals filed for this visit.   Subjective Assessment - 08/27/19 0931    Subjective Pt states that her knee is doing much better today. She has no pain currently.    Pertinent History Lt TKA revision 08/15/19; chronic neck and back pain    Patient Stated Goals reduce pain, be able to walk with normal gait no AD  Currently in Pain? No/denies                             [facility] Adult PT Treatment/Exercise - 08/27/19 0001      Ambulation/Gait   Pre-Gait Activities Rt forward step over/back with UE support in walker x15 reps, weight shift Lt/Rt x15 reps       Knee/Hip Exercises: Supine   Short Arc Quad Sets Strengthening;2 sets;10 reps    Short Arc Quad Sets Limitations foam roll under knee    Heel Slides AAROM;Left;1 set;15 reps    Heel Slides Limitations strap: quad set at the end     Knee Extension Left;PROM;3 sets    Knee Extension Limitations 60 sec with 4lb weight over knee       Knee/Hip Exercises: Sidelying   Hip ABduction Left;Strengthening;2 sets;10 reps      Vasopneumatic   Number Minutes Vasopneumatic  15 minutes    Vasopnuematic Location  Knee    Vasopneumatic Pressure Medium    Vasopneumatic Temperature  3 snowflakes                   PT Education - 08/27/19 0948    Education Details technique with therex    Person(s) Educated Patient    Methods Explanation    Comprehension Verbalized understanding            PT Short Term Goals - 08/19/19 1759      PT SHORT TERM GOAL #1   Title be independent in initial HEP    Time 4    Period Weeks    Status New    Target Date 09/16/19      PT SHORT TERM GOAL #2   Title demonstrates 90 deg AROM knee flexion and 5 or less deg knee ext for improved gait    Time 4    Period Weeks    Status New    Target Date 09/16/19      PT SHORT TERM GOAL #3   Title Be able to ambulate step through gait with LRAD    Baseline step to with RW    Time 4    Period Weeks    Status New    Target Date 09/16/19             PT Long Term Goals - 08/19/19 1313      PT LONG TERM GOAL #1   Title be independent in advanced HEP    Time 12    Period Weeks    Status New    Target Date 11/11/19      PT LONG TERM GOAL #2   Title FOTO < or = to 53% limited    Baseline 96%    Time 12    Period Weeks    Status New    Target Date 11/11/19      PT LONG TERM GOAL #3   Title knee ROM 120-0 deg for improved gait and function    Time 12    Period Weeks    Status New    Target Date 11/11/19      PT LONG TERM GOAL #4   Title able to ambulate in community with 1/10 pain at most due to improved knee strength    Baseline has had pain since original knee surgery    Time 12    Period Weeks    Status New    Target Date 11/11/19  PT LONG TERM GOAL #5   Title Lt knee and hip strength 5/5 for ability to walk outside, stairs, and perform transfers without compensations    Time 12    Period Weeks    Status New    Target Date 11/11/19      PT LONG TERM GOAL #6   Title .Marland KitchenMarland Kitchen                 Plan - 08/27/19 1054    Clinical Impression Statement Pt has improved knee pain today, 0/10 upon arrival. Session focused on increasing knee flexion/extension ROM and hip strength. Also  addressed gait mechanics to improve step length and push off on the Lt. Pt denied pain increase during her session. Will continue with current POC.    PT Treatment/Interventions ADLs/Self Care Home Management;Cryotherapy;Ultrasound;Traction;Moist Heat;Electrical Stimulation;Functional mobility training;Neuromuscular re-education;Therapeutic activities;Therapeutic exercise;Patient/family education;Manual techniques;Taping;Dry needling;Spinal Manipulations;Joint Manipulations    PT Next Visit Plan ROM measure, knee strength and ROM progression, vaso    PT Home Exercise Plan [id number] (new code for knee)           Patient will benefit from skilled therapeutic intervention in order to improve the following deficits and impairments:  Decreased activity tolerance, Decreased strength, Postural dysfunction, Improper body mechanics, Impaired flexibility, Pain, Increased muscle spasms, Decreased range of motion, Abnormal gait, Difficulty walking, Increased edema  Visit Diagnosis: Left knee pain, unspecified chronicity  Stiffness of left knee, not elsewhere classified     Problem List Patient Active Problem List   Diagnosis Date Noted  . S/P revision of total knee, left 08/15/2019  . Preop cardiovascular exam 08/13/2019  . Osteoarthritis of ankle, right 01/29/2018  . Disorder of tendon of posterior tibial muscle 07/06/2017  . Sleep related headaches 09/07/2016  . Nightmares REM-sleep type 09/07/2016  . RLS (restless legs syndrome) 09/07/2016  . Snoring 09/07/2016  . Excessive daytime sleepiness 09/07/2016  . Migraine 07/28/2016  . Solitary pulmonary nodule 06/08/2016  . S/P knee replacement 05/05/2015  . Former smoker 07/10/2014  . Shortness of breath 06/07/2014  . Chest pain 06/07/2014  . Osteoarthritis 05/16/2014  . Alcoholic fatty liver 26/41/5830  . Chronic post-traumatic stress disorder (PTSD) 05/02/2014  . Sleep disorder 05/02/2014  . Family history of alcoholism 05/02/2014  .  Macrocytosis 04/19/2014  . Fatigue 08/15/2013  . Thyroid nodule 09/17/2012  . Hypothyroid 07/21/2010  . Type II diabetes mellitus, well controlled ([place]) 06/23/2010  . Hypertension associated with diabetes ([place]) 06/23/2010  . Alcohol use disorder, severe, in sustained remission ([place]) 06/23/2010  . Hyperlipidemia associated with type 2 diabetes mellitus ([place]) 06/23/2010  . Morbid obesity ([place]) 06/23/2010  . Chronic depression 06/23/2010  . History of colonic polyps 04/12/2010    12:14 PM,08/27/19 Sherol Dade PT, DPT [place] [address] [place], [place], [address] Phone: [phone]   Fax:  [phone]  Name: [name] [name] MRN: [id number] Date of Birth: 10-18-1955

## 2019-08-28 ENCOUNTER — Encounter: Payer: Self-pay | Admitting: Physical Therapy

## 2019-08-28 ENCOUNTER — Ambulatory Visit: Payer: Medicare Other | Admitting: Physical Therapy

## 2019-08-28 DIAGNOSIS — M6281 Muscle weakness (generalized): Secondary | ICD-10-CM

## 2019-08-28 DIAGNOSIS — M25562 Pain in left knee: Secondary | ICD-10-CM | POA: Diagnosis not present

## 2019-08-28 DIAGNOSIS — R262 Difficulty in walking, not elsewhere classified: Secondary | ICD-10-CM

## 2019-08-28 DIAGNOSIS — M25662 Stiffness of left knee, not elsewhere classified: Secondary | ICD-10-CM

## 2019-08-28 NOTE — Patient Instructions (Signed)
Access Code: [email or web address] URL: [email or web address] Date: 08/28/2019 Prepared by: [place]  Exercises Supine Quad Set - 3 x daily - 7 x weekly - 10 reps - 1 sets - 10 hold Supine Active Straight Leg Raise - 3 x daily - 7 x weekly - 10 reps - 1 sets Supine Heel Slide with Strap - 3 x daily - 7 x weekly - 10 reps - 1 sets - 10 hold Seated Knee Flexion Extension AROM - 3 x daily - 7 x weekly - 10 reps - 1 sets Seated Long Arc Quad - 3 x daily - 7 x weekly - 10 reps - 1 sets Seated Hamstring Stretch - 2 x daily - 7 x weekly - 3 reps - 1 sets - 30-60 sec hold Seated Hamstring Stretch with Chair - 2 x daily - 7 x weekly - 3 reps - 1 sets - 30-60 sec hold Heel rises with counter support - 1 x daily - 7 x weekly - 20 reps  [facility] [address] [place], [place] [address] Phone # [phone] Fax [phone]

## 2019-08-28 NOTE — Therapy (Signed)
[facility] [address], [place], [place], [address] Phone: [phone]   Fax:  [phone]  Physical Therapy Treatment  Patient Details  Name: [name] [name] MRN: [id number] Date of Birth: 09-Aug-1955 Referring Provider (PT): Dr. Paralee Cancel   Encounter Date: 08/28/2019   PT End of Session - 08/28/19 1223    Visit Number 4    Date for PT Re-Evaluation 11/11/19    Authorization Type medicare    Authorization - Visit Number 17    Authorization - Number of Visits 30    PT Start Time 2423    PT Stop Time 1238    PT Time Calculation (min) 53 min    Activity Tolerance Patient tolerated treatment well;No increased pain    Behavior During Therapy WFL for tasks assessed/performed           Past Medical History:  Diagnosis Date  . Acute meniscal tear of knee    Left knee  . Alcohol problem drinking    rehab  . Anemia    menstrual related  . Anxiety   . Arthritis    right ankle-neuropathy  . Colon polyps   . Depression   . DM (diabetes mellitus), type 2 ([place]) 12/2009   type 2  . Evalluate for OSA (obstructive sleep apnea) 08/15/2013   No OSA on sleep study but may be underestimated.-never used cpap    . GERD (gastroesophageal reflux disease)    not currently taking.  Marland Kitchen Hyperlipidemia   . Hypertension   . Hypothyroidism   . Liver disease   . Neuromuscular disorder ([place])    neropathy bilateral feet.  . Thyroid disease     Past Surgical History:  Procedure Laterality Date  . ADENOIDECTOMY    . BREAST SURGERY Right    lumpectomy-benign  . [place]   1 time  . CHOLECYSTECTOMY    . INNER EAR SURGERY     left ear had tubes put in and removed, scar tissue built up/ loss of hearing in left ear for over a year  . MASS EXCISION Left 12/16/2013   Procedure: EXCISION LEFT  DISTAL THIGH MASS;  Surgeon: Mauri Pole, MD;  Location: [facility];  Service: Orthopedics;  Laterality: Left;  . right ankle  surgery      x 2- no retained hardware  . right rotator cuff surgery      left side  . TONSILLECTOMY    . TOTAL KNEE ARTHROPLASTY  2009   right  . TOTAL KNEE ARTHROPLASTY Left 05/05/2015   Procedure: LEFT TOTAL KNEE ARTHROPLASTY;  Surgeon: Paralee Cancel, MD;  Location: [facility];  Service: Orthopedics;  Laterality: Left;  . TOTAL KNEE REVISION Right 12/16/2013   Procedure: RIGHT TOTAL KNEE REVISION POLY EXCHANGE;  Surgeon: Mauri Pole, MD;  Location: [facility];  Service: Orthopedics;  Laterality: Right;  . TOTAL KNEE REVISION Left 08/15/2019   Procedure: LEFT KNEE REVISION;  Surgeon: Paralee Cancel, MD;  Location: [facility];  Service: Orthopedics;  Laterality: Left;  2 hrs  . TUBAL LIGATION  1987    There were no vitals filed for this visit.   Subjective Assessment - 08/28/19 1146    Subjective Pt states that things are going well. No pain currently, but there is some soreness in the quad.    Pertinent History Lt TKA revision 08/15/19; chronic neck and back pain    Patient Stated Goals reduce pain, be able to walk with normal gait  no AD    Currently in Pain? No/denies              [facility] PT Assessment - 08/28/19 0001      PROM   PROM Assessment Site Knee    Right/Left Knee Left    Left Knee Extension -7    Left Knee Flexion 105                         [facility] Adult PT Treatment/Exercise - 08/28/19 0001      Ambulation/Gait   Pre-Gait Activities ipsilateral Rt UE/LE flexion at wall x15 reps       Knee/Hip Exercises: Stretches   Knee: Self-Stretch to increase Flexion Right    Knee: Self-Stretch Limitations 10x10 sec hold   Seated Lt knee flexion stretch 5x10 sec hold      Knee/Hip Exercises: Standing   Heel Raises Left;1 set;15 reps    Heel Raises Limitations Rt LE on 1st step       Knee/Hip Exercises: Supine   Other Supine Knee/Hip Exercises hamstring curl isometric into red physioball 10x5 sec hold       Vasopneumatic   Number Minutes Vasopneumatic  15 minutes     Vasopnuematic Location  Knee    Vasopneumatic Pressure Medium    Vasopneumatic Temperature  3 snowflakes      Manual Therapy   Manual therapy comments rolling stick Lt quadriceps     Joint Mobilization gentle rotation mobilization to tibia/femur during quad set 2x5 reps (5 sec hold)                   PT Education - 08/28/19 1223    Education Details updated HEP    Person(s) Educated Patient    Methods Explanation;Handout    Comprehension Verbalized understanding            PT Short Term Goals - 08/28/19 1226      PT SHORT TERM GOAL #1   Title be independent in initial HEP    Time 4    Period Weeks    Status Achieved    Target Date 09/16/19      PT SHORT TERM GOAL #2   Title demonstrates 90 deg AROM knee flexion and 5 or less deg knee ext for improved gait    Baseline ext: -7, flexion: 105    Time 4    Period Weeks    Status Partially Met    Target Date 09/16/19      PT SHORT TERM GOAL #3   Title Be able to ambulate step through gait with LRAD    Baseline step to with RW    Time 4    Period Weeks    Status New    Target Date 09/16/19             PT Long Term Goals - 08/19/19 1313      PT LONG TERM GOAL #1   Title be independent in advanced HEP    Time 12    Period Weeks    Status New    Target Date 11/11/19      PT LONG TERM GOAL #2   Title FOTO < or = to 53% limited    Baseline 96%    Time 12    Period Weeks    Status New    Target Date 11/11/19      PT LONG TERM GOAL #3   Title knee ROM  120-0 deg for improved gait and function    Time 12    Period Weeks    Status New    Target Date 11/11/19      PT LONG TERM GOAL #4   Title able to ambulate in community with 1/10 pain at most due to improved knee strength    Baseline has had pain since original knee surgery    Time 12    Period Weeks    Status New    Target Date 11/11/19      PT LONG TERM GOAL #5   Title Lt knee and hip strength 5/5 for ability to walk outside, stairs,  and perform transfers without compensations    Time 12    Period Weeks    Status New    Target Date 11/11/19      PT LONG TERM GOAL #6   Title .Marland KitchenMarland Kitchen                 Plan - 08/28/19 1224    Clinical Impression Statement Pt is making steady progress towards her goals. Lt knee flexion increased to 105 deg and extension lacking ~7 degrees today. She is working on her HEP consistently and some updates have been made to place more emphasis on gaining knee extension moving forward. Pt demonstrates improving steadiness with gait and we will consider gait training with [place] next visit. No pain was noted during or following today's session.    PT Treatment/Interventions ADLs/Self Care Home Management;Cryotherapy;Ultrasound;Traction;Moist Heat;Electrical Stimulation;Functional mobility training;Neuromuscular re-education;Therapeutic activities;Therapeutic exercise;Patient/family education;Manual techniques;Taping;Dry needling;Spinal Manipulations;Joint Manipulations    PT Next Visit Plan knee strength and extension ROM progression, gait with SPC; vaso    PT Home Exercise Plan [id number]           Patient will benefit from skilled therapeutic intervention in order to improve the following deficits and impairments:  Decreased activity tolerance, Decreased strength, Postural dysfunction, Improper body mechanics, Impaired flexibility, Pain, Increased muscle spasms, Decreased range of motion, Abnormal gait, Difficulty walking, Increased edema  Visit Diagnosis: Left knee pain, unspecified chronicity  Stiffness of left knee, not elsewhere classified  Muscle weakness (generalized)  Difficulty in walking, not elsewhere classified     Problem List Patient Active Problem List   Diagnosis Date Noted  . S/P revision of total knee, left 08/15/2019  . Preop cardiovascular exam 08/13/2019  . Osteoarthritis of ankle, right 01/29/2018  . Disorder of tendon of posterior tibial muscle 07/06/2017  .  Sleep related headaches 09/07/2016  . Nightmares REM-sleep type 09/07/2016  . RLS (restless legs syndrome) 09/07/2016  . Snoring 09/07/2016  . Excessive daytime sleepiness 09/07/2016  . Migraine 07/28/2016  . Solitary pulmonary nodule 06/08/2016  . S/P knee replacement 05/05/2015  . Former smoker 07/10/2014  . Shortness of breath 06/07/2014  . Chest pain 06/07/2014  . Osteoarthritis 05/16/2014  . Alcoholic fatty liver 91/47/8295  . Chronic post-traumatic stress disorder (PTSD) 05/02/2014  . Sleep disorder 05/02/2014  . Family history of alcoholism 05/02/2014  . Macrocytosis 04/19/2014  . Fatigue 08/15/2013  . Thyroid nodule 09/17/2012  . Hypothyroid 07/21/2010  . Type II diabetes mellitus, well controlled ([place]) 06/23/2010  . Hypertension associated with diabetes ([place]) 06/23/2010  . Alcohol use disorder, severe, in sustained remission ([place]) 06/23/2010  . Hyperlipidemia associated with type 2 diabetes mellitus ([place]) 06/23/2010  . Morbid obesity ([place]) 06/23/2010  . Chronic depression 06/23/2010  . History of colonic polyps 04/12/2010    1:32 PM,08/28/19 Sherol Dade PT, DPT [place]  [facility] [address] [place], [place], [address] Phone: [phone]   Fax:  [phone]  Name: [name] [name] MRN: [id number] Date of Birth: [birth year]

## 2019-09-02 ENCOUNTER — Ambulatory Visit: Payer: Medicare Other

## 2019-09-02 ENCOUNTER — Other Ambulatory Visit: Payer: Self-pay

## 2019-09-02 DIAGNOSIS — M25662 Stiffness of left knee, not elsewhere classified: Secondary | ICD-10-CM

## 2019-09-02 DIAGNOSIS — R262 Difficulty in walking, not elsewhere classified: Secondary | ICD-10-CM

## 2019-09-02 DIAGNOSIS — M25562 Pain in left knee: Secondary | ICD-10-CM

## 2019-09-02 DIAGNOSIS — M6281 Muscle weakness (generalized): Secondary | ICD-10-CM

## 2019-09-02 NOTE — Therapy (Signed)
[facility] [address] [place], [place], [address] Phone: [phone]   Fax:  [phone]  Physical Therapy Treatment  Patient Details  Name: [name] [name] MRN: [id number] Date of Birth: January 24, 1956 Referring Provider (PT): Dr. Paralee Cancel   Encounter Date: 09/02/2019   PT End of Session - 09/02/19 1612    Visit Number 5    Date for PT Re-Evaluation 11/11/19    Authorization Type medicare    PT Start Time 1532    PT Stop Time 1628    PT Time Calculation (min) 56 min    Activity Tolerance Patient tolerated treatment well;No increased pain    Behavior During Therapy WFL for tasks assessed/performed           Past Medical History:  Diagnosis Date  . Acute meniscal tear of knee    Left knee  . Alcohol problem drinking    rehab  . Anemia    menstrual related  . Anxiety   . Arthritis    right ankle-neuropathy  . Colon polyps   . Depression   . DM (diabetes mellitus), type 2 ([place]) 12/2009   type 2  . Evalluate for OSA (obstructive sleep apnea) 08/15/2013   No OSA on sleep study but may be underestimated.-never used cpap    . GERD (gastroesophageal reflux disease)    not currently taking.  Marland Kitchen Hyperlipidemia   . Hypertension   . Hypothyroidism   . Liver disease   . Neuromuscular disorder ([place])    neropathy bilateral feet.  . Thyroid disease     Past Surgical History:  Procedure Laterality Date  . ADENOIDECTOMY    . BREAST SURGERY Right    lumpectomy-benign  . [place]   1 time  . CHOLECYSTECTOMY    . INNER EAR SURGERY     left ear had tubes put in and removed, scar tissue built up/ loss of hearing in left ear for over a year  . MASS EXCISION Left 12/16/2013   Procedure: EXCISION LEFT  DISTAL THIGH MASS;  Surgeon: Mauri Pole, MD;  Location: [facility];  Service: Orthopedics;  Laterality: Left;  . right ankle surgery      x 2- no retained hardware  . right rotator cuff surgery       left side  . TONSILLECTOMY    . TOTAL KNEE ARTHROPLASTY  2009   right  . TOTAL KNEE ARTHROPLASTY Left 05/05/2015   Procedure: LEFT TOTAL KNEE ARTHROPLASTY;  Surgeon: Paralee Cancel, MD;  Location: [facility];  Service: Orthopedics;  Laterality: Left;  . TOTAL KNEE REVISION Right 12/16/2013   Procedure: RIGHT TOTAL KNEE REVISION POLY EXCHANGE;  Surgeon: Mauri Pole, MD;  Location: [facility];  Service: Orthopedics;  Laterality: Right;  . TOTAL KNEE REVISION Left 08/15/2019   Procedure: LEFT KNEE REVISION;  Surgeon: Paralee Cancel, MD;  Location: [facility];  Service: Orthopedics;  Laterality: Left;  2 hrs  . TUBAL LIGATION  1987    There were no vitals filed for this visit.   Subjective Assessment - 09/02/19 1532    Subjective I am doing OK.  It is getting better every day.    Currently in Pain? Yes    Pain Score 1     Pain Location Knee    Pain Orientation Left    Pain Descriptors / Indicators Aching    Pain Type Surgical pain    Pain Onset More than a month ago  Pain Frequency Intermittent    Aggravating Factors  walking, standing    Pain Relieving Factors ice, pain medicine, rest                             [facility] Adult PT Treatment/Exercise - 09/02/19 0001      Lumbar Exercises: Stretches   Active Hamstring Stretch 3 reps;20 seconds;Left;Right      Knee/Hip Exercises: Standing   Forward Step Up Left;2 sets;10 reps;Step Height: 6";Hand Hold: 2    Rocker Board 3 minutes    Rebounder weight shifting 3 ways x 1 minute each      Knee/Hip Exercises: Seated   Long Arc Quad Strengthening;Left;1 set;15 reps      Vasopneumatic   Number Minutes Vasopneumatic  15 minutes    Vasopnuematic Location  Knee    Vasopneumatic Pressure Medium    Vasopneumatic Temperature  3 snowflakes      Manual Therapy   Manual Therapy Soft tissue mobilization;Myofascial release    Manual therapy comments elongation to distal quad/hamstrings and proximal gastroc and patellar mobs                     PT Short Term Goals - 08/28/19 1226      PT SHORT TERM GOAL #1   Title be independent in initial HEP    Time 4    Period Weeks    Status Achieved    Target Date 09/16/19      PT SHORT TERM GOAL #2   Title demonstrates 90 deg AROM knee flexion and 5 or less deg knee ext for improved gait    Baseline ext: -7, flexion: 105    Time 4    Period Weeks    Status Partially Met    Target Date 09/16/19      PT SHORT TERM GOAL #3   Title Be able to ambulate step through gait with LRAD    Baseline step to with RW    Time 4    Period Weeks    Status New    Target Date 09/16/19             PT Long Term Goals - 08/19/19 1313      PT LONG TERM GOAL #1   Title be independent in advanced HEP    Time 12    Period Weeks    Status New    Target Date 11/11/19      PT LONG TERM GOAL #2   Title FOTO < or = to 53% limited    Baseline 96%    Time 12    Period Weeks    Status New    Target Date 11/11/19      PT LONG TERM GOAL #3   Title knee ROM 120-0 deg for improved gait and function    Time 12    Period Weeks    Status New    Target Date 11/11/19      PT LONG TERM GOAL #4   Title able to ambulate in community with 1/10 pain at most due to improved knee strength    Baseline has had pain since original knee surgery    Time 12    Period Weeks    Status New    Target Date 11/11/19      PT LONG TERM GOAL #5   Title Lt knee and hip strength 5/5 for ability to walk outside,  stairs, and perform transfers without compensations    Time 12    Period Weeks    Status New    Target Date 11/11/19      PT LONG TERM GOAL #6   Title .Marland KitchenMarland Kitchen                 Plan - 09/02/19 1548    Clinical Impression Statement Pt is making excellent progress s/p Lt TKA. Pt has weaned from the walker to use of cane and demonstrates good technique with use of cane on level surface.  Pt with good quad activation with long arc quads and step ups on the Lt today. Pt with well  healing surgical incision and edema appropriate s/p knee replacement.  Pt with tension in distal quads and hamstrings and proximal gastroc and demonstrated improved tissue mobility after manual therapy.  Pt requires minor tactile and demo cueing for technique with exercise today.  Pt will continue to benefit from skilled PT to address strength, flexibility, gait and edema post surgery.    Rehab Potential Excellent    PT Frequency 3x / week    PT Duration 12 weeks    PT Treatment/Interventions ADLs/Self Care Home Management;Cryotherapy;Ultrasound;Traction;Moist Heat;Electrical Stimulation;Functional mobility training;Neuromuscular re-education;Therapeutic activities;Therapeutic exercise;Patient/family education;Manual techniques;Taping;Dry needling;Spinal Manipulations;Joint Manipulations    PT Next Visit Plan knee strength and extension ROM progression, gait with SPC; vaso    PT Home Exercise Plan [id number]    Recommended Other Services initial cert is signed    Consulted and Agree with Plan of Care Patient           Patient will benefit from skilled therapeutic intervention in order to improve the following deficits and impairments:  Decreased activity tolerance, Decreased strength, Postural dysfunction, Improper body mechanics, Impaired flexibility, Pain, Increased muscle spasms, Decreased range of motion, Abnormal gait, Difficulty walking, Increased edema  Visit Diagnosis: Left knee pain, unspecified chronicity  Stiffness of left knee, not elsewhere classified  Muscle weakness (generalized)  Difficulty in walking, not elsewhere classified     Problem List Patient Active Problem List   Diagnosis Date Noted  . S/P revision of total knee, left 08/15/2019  . Preop cardiovascular exam 08/13/2019  . Osteoarthritis of ankle, right 01/29/2018  . Disorder of tendon of posterior tibial muscle 07/06/2017  . Sleep related headaches 09/07/2016  . Nightmares REM-sleep type 09/07/2016  . RLS  (restless legs syndrome) 09/07/2016  . Snoring 09/07/2016  . Excessive daytime sleepiness 09/07/2016  . Migraine 07/28/2016  . Solitary pulmonary nodule 06/08/2016  . S/P knee replacement 05/05/2015  . Former smoker 07/10/2014  . Shortness of breath 06/07/2014  . Chest pain 06/07/2014  . Osteoarthritis 05/16/2014  . Alcoholic fatty liver 16/11/9602  . Chronic post-traumatic stress disorder (PTSD) 05/02/2014  . Sleep disorder 05/02/2014  . Family history of alcoholism 05/02/2014  . Macrocytosis 04/19/2014  . Fatigue 08/15/2013  . Thyroid nodule 09/17/2012  . Hypothyroid 07/21/2010  . Type II diabetes mellitus, well controlled ([place]) 06/23/2010  . Hypertension associated with diabetes ([place]) 06/23/2010  . Alcohol use disorder, severe, in sustained remission ([place]) 06/23/2010  . Hyperlipidemia associated with type 2 diabetes mellitus ([place]) 06/23/2010  . Morbid obesity ([place]) 06/23/2010  . Chronic depression 06/23/2010  . History of colonic polyps 04/12/2010     Sigurd Sos, PT 09/02/19 4:14 PM  [facility] [address], [place], [place], [address] Phone: [phone]   Fax:  [phone]  Name: [name] [name] MRN: [id number] Date of  Birth: 11/26/1955

## 2019-09-04 ENCOUNTER — Other Ambulatory Visit: Payer: Self-pay

## 2019-09-04 ENCOUNTER — Ambulatory Visit: Payer: Medicare Other

## 2019-09-04 DIAGNOSIS — M25562 Pain in left knee: Secondary | ICD-10-CM

## 2019-09-04 DIAGNOSIS — R262 Difficulty in walking, not elsewhere classified: Secondary | ICD-10-CM

## 2019-09-04 DIAGNOSIS — M6281 Muscle weakness (generalized): Secondary | ICD-10-CM

## 2019-09-04 DIAGNOSIS — M25662 Stiffness of left knee, not elsewhere classified: Secondary | ICD-10-CM

## 2019-09-04 NOTE — Therapy (Signed)
[facility] [address] [place], [place], [address] Phone: [phone]   Fax:  [phone]  Physical Therapy Treatment  Patient Details  Name: [name] [name] MRN: [id number] Date of Birth: Jul 17, 1955 Referring Provider (PT): Dr. Paralee Cancel   Encounter Date: 09/04/2019   PT End of Session - 09/04/19 1614    Visit Number 6    Date for PT Re-Evaluation 11/11/19    Authorization Type medicare    Progress Note Due on Visit 10    PT Start Time 1655    PT Stop Time 1629    PT Time Calculation (min) 58 min    Activity Tolerance Patient tolerated treatment well;No increased pain    Behavior During Therapy WFL for tasks assessed/performed           Past Medical History:  Diagnosis Date  . Acute meniscal tear of knee    Left knee  . Alcohol problem drinking    rehab  . Anemia    menstrual related  . Anxiety   . Arthritis    right ankle-neuropathy  . Colon polyps   . Depression   . DM (diabetes mellitus), type 2 ([place]) 12/2009   type 2  . Evalluate for OSA (obstructive sleep apnea) 08/15/2013   No OSA on sleep study but may be underestimated.-never used cpap    . GERD (gastroesophageal reflux disease)    not currently taking.  Marland Kitchen Hyperlipidemia   . Hypertension   . Hypothyroidism   . Liver disease   . Neuromuscular disorder ([place])    neropathy bilateral feet.  . Thyroid disease     Past Surgical History:  Procedure Laterality Date  . ADENOIDECTOMY    . BREAST SURGERY Right    lumpectomy-benign  . [place]   1 time  . CHOLECYSTECTOMY    . INNER EAR SURGERY     left ear had tubes put in and removed, scar tissue built up/ loss of hearing in left ear for over a year  . MASS EXCISION Left 12/16/2013   Procedure: EXCISION LEFT  DISTAL THIGH MASS;  Surgeon: Mauri Pole, MD;  Location: [facility];  Service: Orthopedics;  Laterality: Left;  . right ankle surgery      x 2- no retained hardware   . right rotator cuff surgery      left side  . TONSILLECTOMY    . TOTAL KNEE ARTHROPLASTY  2009   right  . TOTAL KNEE ARTHROPLASTY Left 05/05/2015   Procedure: LEFT TOTAL KNEE ARTHROPLASTY;  Surgeon: Paralee Cancel, MD;  Location: [facility];  Service: Orthopedics;  Laterality: Left;  . TOTAL KNEE REVISION Right 12/16/2013   Procedure: RIGHT TOTAL KNEE REVISION POLY EXCHANGE;  Surgeon: Mauri Pole, MD;  Location: [facility];  Service: Orthopedics;  Laterality: Right;  . TOTAL KNEE REVISION Left 08/15/2019   Procedure: LEFT KNEE REVISION;  Surgeon: Paralee Cancel, MD;  Location: [facility];  Service: Orthopedics;  Laterality: Left;  2 hrs  . TUBAL LIGATION  1987    There were no vitals filed for this visit.   Subjective Assessment - 09/04/19 1539    Subjective I was really active after I was here so I was sore.  Not bad today.    Currently in Pain? Yes    Pain Score 1     Pain Location Knee    Pain Orientation Left  [facility] PT Assessment - 09/04/19 0001      PROM   PROM Assessment Site Knee    Right/Left Knee Left    Left Knee Extension -7    Left Knee Flexion 105                         [facility] Adult PT Treatment/Exercise - 09/04/19 0001      Lumbar Exercises: Stretches   Active Hamstring Stretch 3 reps;20 seconds;Left;Right      Knee/Hip Exercises: Standing   Forward Step Up Left;2 sets;10 reps;Step Height: 6";Hand Hold: 2    Rocker Board 3 minutes    Rebounder weight shifting 3 ways x 1 minute each      Knee/Hip Exercises: Seated   Heel Slides AAROM;10 reps    Heel Slides Limitations using slider with     Sit to Sand 10 reps      Knee/Hip Exercises: Supine   Heel Slides AAROM;Left;1 set;15 reps    Heel Slides Limitations overpressure with strap      Vasopneumatic   Number Minutes Vasopneumatic  15 minutes    Vasopnuematic Location  Knee    Vasopneumatic Pressure Medium    Vasopneumatic Temperature  3 snowflakes      Manual Therapy   Manual  Therapy Soft tissue mobilization;Myofascial release    Manual therapy comments elongation to distal quad/hamstrings and proximal gastroc and patellar mobs                    PT Short Term Goals - 08/28/19 1226      PT SHORT TERM GOAL #1   Title be independent in initial HEP    Time 4    Period Weeks    Status Achieved    Target Date 09/16/19      PT SHORT TERM GOAL #2   Title demonstrates 90 deg AROM knee flexion and 5 or less deg knee ext for improved gait    Baseline ext: -7, flexion: 105    Time 4    Period Weeks    Status Partially Met    Target Date 09/16/19      PT SHORT TERM GOAL #3   Title Be able to ambulate step through gait with LRAD    Baseline step to with RW    Time 4    Period Weeks    Status New    Target Date 09/16/19             PT Long Term Goals - 08/19/19 1313      PT LONG TERM GOAL #1   Title be independent in advanced HEP    Time 12    Period Weeks    Status New    Target Date 11/11/19      PT LONG TERM GOAL #2   Title FOTO < or = to 53% limited    Baseline 96%    Time 12    Period Weeks    Status New    Target Date 11/11/19      PT LONG TERM GOAL #3   Title knee ROM 120-0 deg for improved gait and function    Time 12    Period Weeks    Status New    Target Date 11/11/19      PT LONG TERM GOAL #4   Title able to ambulate in community with 1/10 pain at most due to improved knee strength  Baseline has had pain since original knee surgery    Time 12    Period Weeks    Status New    Target Date 11/11/19      PT LONG TERM GOAL #5   Title Lt knee and hip strength 5/5 for ability to walk outside, stairs, and perform transfers without compensations    Time 12    Period Weeks    Status New    Target Date 11/11/19      PT LONG TERM GOAL #6   Title .Marland KitchenMarland Kitchen                 Plan - 09/04/19 1549    Clinical Impression Statement Pt is making excellent progress s/p Lt TKA. Pt has weaned from the walker to use of  cane and demonstrates good technique with use of cane on level surface.  Pt requires min verbal cues for quad and gluteal activation with standing exercise today.  Pt with tension and tenderness at distal quad and hamstring with edema consistent with post-op TKA.   Pt will continue to benefit from skilled PT to address strength, flexibility, gait and edema post surgery.    PT Frequency 2x / week    PT Duration 12 weeks    PT Treatment/Interventions ADLs/Self Care Home Management;Cryotherapy;Ultrasound;Traction;Moist Heat;Electrical Stimulation;Functional mobility training;Neuromuscular re-education;Therapeutic activities;Therapeutic exercise;Patient/family education;Manual techniques;Taping;Dry needling;Spinal Manipulations;Joint Manipulations    PT Next Visit Plan knee strength and extension ROM progression, manual for ROM and tissue mobility, edema management    PT Home Exercise Plan [id number]    Consulted and Agree with Plan of Care Patient           Patient will benefit from skilled therapeutic intervention in order to improve the following deficits and impairments:  Decreased activity tolerance, Decreased strength, Postural dysfunction, Improper body mechanics, Impaired flexibility, Pain, Increased muscle spasms, Decreased range of motion, Abnormal gait, Difficulty walking, Increased edema  Visit Diagnosis: Left knee pain, unspecified chronicity  Stiffness of left knee, not elsewhere classified  Muscle weakness (generalized)  Difficulty in walking, not elsewhere classified     Problem List Patient Active Problem List   Diagnosis Date Noted  . S/P revision of total knee, left 08/15/2019  . Preop cardiovascular exam 08/13/2019  . Osteoarthritis of ankle, right 01/29/2018  . Disorder of tendon of posterior tibial muscle 07/06/2017  . Sleep related headaches 09/07/2016  . Nightmares REM-sleep type 09/07/2016  . RLS (restless legs syndrome) 09/07/2016  . Snoring 09/07/2016  .  Excessive daytime sleepiness 09/07/2016  . Migraine 07/28/2016  . Solitary pulmonary nodule 06/08/2016  . S/P knee replacement 05/05/2015  . Former smoker 07/10/2014  . Shortness of breath 06/07/2014  . Chest pain 06/07/2014  . Osteoarthritis 05/16/2014  . Alcoholic fatty liver 49/70/2637  . Chronic post-traumatic stress disorder (PTSD) 05/02/2014  . Sleep disorder 05/02/2014  . Family history of alcoholism 05/02/2014  . Macrocytosis 04/19/2014  . Fatigue 08/15/2013  . Thyroid nodule 09/17/2012  . Hypothyroid 07/21/2010  . Type II diabetes mellitus, well controlled ([place]) 06/23/2010  . Hypertension associated with diabetes ([place]) 06/23/2010  . Alcohol use disorder, severe, in sustained remission ([place]) 06/23/2010  . Hyperlipidemia associated with type 2 diabetes mellitus ([place]) 06/23/2010  . Morbid obesity ([place]) 06/23/2010  . Chronic depression 06/23/2010  . History of colonic polyps 04/12/2010     Sigurd Sos, PT 09/04/19 4:17 PM  [facility] [address], [place], [place], [address] Phone: [phone]  Fax:  [phone]  Name: [name] [name] MRN: [id number] Date of Birth: 07-03-1955

## 2019-09-05 ENCOUNTER — Ambulatory Visit: Payer: Medicare Other | Admitting: Physical Therapy

## 2019-09-05 ENCOUNTER — Encounter: Payer: Self-pay | Admitting: Physical Therapy

## 2019-09-05 ENCOUNTER — Encounter: Payer: Self-pay | Admitting: Family Medicine

## 2019-09-05 DIAGNOSIS — M25662 Stiffness of left knee, not elsewhere classified: Secondary | ICD-10-CM

## 2019-09-05 DIAGNOSIS — M6281 Muscle weakness (generalized): Secondary | ICD-10-CM

## 2019-09-05 DIAGNOSIS — M25562 Pain in left knee: Secondary | ICD-10-CM | POA: Diagnosis not present

## 2019-09-05 DIAGNOSIS — R262 Difficulty in walking, not elsewhere classified: Secondary | ICD-10-CM

## 2019-09-05 NOTE — Therapy (Signed)
[facility] [address] [place], [place], [address] Phone: [phone]   Fax:  [phone]  Physical Therapy Treatment  Patient Details  Name: [name] [name] MRN: [id number] Date of Birth: [birth year] Referring Provider (PT): Dr. Paralee Cancel   Encounter Date: 09/05/2019   PT End of Session - 09/05/19 1615    Visit Number 7    Date for PT Re-Evaluation 11/11/19    Authorization Type medicare    Progress Note Due on Visit 10    PT Start Time 2119    PT Stop Time 1625    PT Time Calculation (min) 55 min    Activity Tolerance Patient tolerated treatment well;No increased pain    Behavior During Therapy WFL for tasks assessed/performed           Past Medical History:  Diagnosis Date  . Acute meniscal tear of knee    Left knee  . Alcohol problem drinking    rehab  . Anemia    menstrual related  . Anxiety   . Arthritis    right ankle-neuropathy  . Colon polyps   . Depression   . DM (diabetes mellitus), type 2 ([place]) 12/2009   type 2  . Evalluate for OSA (obstructive sleep apnea) 08/15/2013   No OSA on sleep study but may be underestimated.-never used cpap    . GERD (gastroesophageal reflux disease)    not currently taking.  Marland Kitchen Hyperlipidemia   . Hypertension   . Hypothyroidism   . Liver disease   . Neuromuscular disorder ([place])    neropathy bilateral feet.  . Thyroid disease     Past Surgical History:  Procedure Laterality Date  . ADENOIDECTOMY    . BREAST SURGERY Right    lumpectomy-benign  . [place]   1 time  . CHOLECYSTECTOMY    . INNER EAR SURGERY     left ear had tubes put in and removed, scar tissue built up/ loss of hearing in left ear for over a year  . MASS EXCISION Left 12/16/2013   Procedure: EXCISION LEFT  DISTAL THIGH MASS;  Surgeon: Mauri Pole, MD;  Location: [facility];  Service: Orthopedics;  Laterality: Left;  . right ankle surgery      x 2- no retained hardware    . right rotator cuff surgery      left side  . TONSILLECTOMY    . TOTAL KNEE ARTHROPLASTY  2009   right  . TOTAL KNEE ARTHROPLASTY Left 05/05/2015   Procedure: LEFT TOTAL KNEE ARTHROPLASTY;  Surgeon: Paralee Cancel, MD;  Location: [facility];  Service: Orthopedics;  Laterality: Left;  . TOTAL KNEE REVISION Right 12/16/2013   Procedure: RIGHT TOTAL KNEE REVISION POLY EXCHANGE;  Surgeon: Mauri Pole, MD;  Location: [facility];  Service: Orthopedics;  Laterality: Right;  . TOTAL KNEE REVISION Left 08/15/2019   Procedure: LEFT KNEE REVISION;  Surgeon: Paralee Cancel, MD;  Location: [facility];  Service: Orthopedics;  Laterality: Left;  2 hrs  . TUBAL LIGATION  1987    There were no vitals filed for this visit.   Subjective Assessment - 09/05/19 1532    Subjective Pt states that she has been doing well with using her cane. She feels a little unsteady, but it's not bad.    Currently in Pain? Yes    Pain Score 2     Pain Location Knee    Pain Orientation Left    Pain Descriptors / Indicators  Aching;Sore    Pain Type Surgical pain    Pain Radiating Towards none    Pain Onset 1 to 4 weeks ago    Pain Frequency Intermittent                             [facility] Adult PT Treatment/Exercise - 09/05/19 0001      Knee/Hip Exercises: Stretches   Knee: Self-Stretch to increase Flexion Left;10 seconds    Knee: Self-Stretch Limitations x10 reps, on 2nd step    Gastroc Stretch Left;3 reps;20 seconds    Gastroc Stretch Limitations standing      Knee/Hip Exercises: Aerobic   Nustep seat 9: L3 x5 min, PT present to discuss MD visit      Knee/Hip Exercises: Standing   Terminal Knee Extension Left;Strengthening;1 set;20 reps    Theraband Level (Terminal Knee Extension) Level 2 (Red)    Rocker Board 2 minutes   AP/Lt-Rt     Vasopneumatic   Number Minutes Vasopneumatic  15 minutes    Vasopnuematic Location  Knee    Vasopneumatic Pressure Medium    Vasopneumatic Temperature  3 snowflakes       Manual Therapy   Manual therapy comments Rolling stick Lt lateral quad/ITB    Joint Mobilization Lt fibular mobilization rotation/PA direction x3 bouts                  PT Education - 09/05/19 1542    Education Details technique with therex    Person(s) Educated Patient    Methods Explanation;Verbal cues    Comprehension Verbalized understanding;Returned demonstration            PT Short Term Goals - 08/28/19 1226      PT SHORT TERM GOAL #1   Title be independent in initial HEP    Time 4    Period Weeks    Status Achieved    Target Date 09/16/19      PT SHORT TERM GOAL #2   Title demonstrates 90 deg AROM knee flexion and 5 or less deg knee ext for improved gait    Baseline ext: -7, flexion: 105    Time 4    Period Weeks    Status Partially Met    Target Date 09/16/19      PT SHORT TERM GOAL #3   Title Be able to ambulate step through gait with LRAD    Baseline step to with RW    Time 4    Period Weeks    Status New    Target Date 09/16/19             PT Long Term Goals - 08/19/19 1313      PT LONG TERM GOAL #1   Title be independent in advanced HEP    Time 12    Period Weeks    Status New    Target Date 11/11/19      PT LONG TERM GOAL #2   Title FOTO < or = to 53% limited    Baseline 96%    Time 12    Period Weeks    Status New    Target Date 11/11/19      PT LONG TERM GOAL #3   Title knee ROM 120-0 deg for improved gait and function    Time 12    Period Weeks    Status New    Target Date 11/11/19  PT LONG TERM GOAL #4   Title able to ambulate in community with 1/10 pain at most due to improved knee strength    Baseline has had pain since original knee surgery    Time 12    Period Weeks    Status New    Target Date 11/11/19      PT LONG TERM GOAL #5   Title Lt knee and hip strength 5/5 for ability to walk outside, stairs, and perform transfers without compensations    Time 12    Period Weeks    Status New    Target Date  11/11/19      PT LONG TERM GOAL #6   Title .Marland KitchenMarland Kitchen                 Plan - 09/05/19 1707    Clinical Impression Statement Pt is having improved confidence with her ambulation using the SPC. Session continued with focus on increasing knee flexibility. PT completed joint mobilization and soft tissue mobilization to the lateral quadriceps. Pt felt this was helpful for the tightness in the upper thigh. Pt discussed adjustments to knee extension stretch for home and pt verbalized understanding of this.    PT Frequency 2x / week    PT Duration 12 weeks    PT Treatment/Interventions ADLs/Self Care Home Management;Cryotherapy;Ultrasound;Traction;Moist Heat;Electrical Stimulation;Functional mobility training;Neuromuscular re-education;Therapeutic activities;Therapeutic exercise;Patient/family education;Manual techniques;Taping;Dry needling;Spinal Manipulations;Joint Manipulations    PT Next Visit Plan gait training with SPC; knee strength and extension ROM progression, manual for ROM and tissue mobility, edema management    PT Home Exercise Plan [id number]    Consulted and Agree with Plan of Care Patient           Patient will benefit from skilled therapeutic intervention in order to improve the following deficits and impairments:  Decreased activity tolerance, Decreased strength, Postural dysfunction, Improper body mechanics, Impaired flexibility, Pain, Increased muscle spasms, Decreased range of motion, Abnormal gait, Difficulty walking, Increased edema  Visit Diagnosis: Left knee pain, unspecified chronicity  Stiffness of left knee, not elsewhere classified  Muscle weakness (generalized)  Difficulty in walking, not elsewhere classified     Problem List Patient Active Problem List   Diagnosis Date Noted  . S/P revision of total knee, left 08/15/2019  . Preop cardiovascular exam 08/13/2019  . Osteoarthritis of ankle, right 01/29/2018  . Disorder of tendon of posterior tibial muscle  07/06/2017  . Sleep related headaches 09/07/2016  . Nightmares REM-sleep type 09/07/2016  . RLS (restless legs syndrome) 09/07/2016  . Snoring 09/07/2016  . Excessive daytime sleepiness 09/07/2016  . Migraine 07/28/2016  . Solitary pulmonary nodule 06/08/2016  . S/P knee replacement 05/05/2015  . Former smoker 07/10/2014  . Shortness of breath 06/07/2014  . Chest pain 06/07/2014  . Osteoarthritis 05/16/2014  . Alcoholic fatty liver 62/13/0865  . Chronic post-traumatic stress disorder (PTSD) 05/02/2014  . Sleep disorder 05/02/2014  . Family history of alcoholism 05/02/2014  . Macrocytosis 04/19/2014  . Fatigue 08/15/2013  . Thyroid nodule 09/17/2012  . Hypothyroid 07/21/2010  . Type II diabetes mellitus, well controlled ([place]) 06/23/2010  . Hypertension associated with diabetes ([place]) 06/23/2010  . Alcohol use disorder, severe, in sustained remission ([place]) 06/23/2010  . Hyperlipidemia associated with type 2 diabetes mellitus ([place]) 06/23/2010  . Morbid obesity ([place]) 06/23/2010  . Chronic depression 06/23/2010  . History of colonic polyps 04/12/2010    5:09 PM,09/05/19 Sherol Dade PT, DPT [place] [facility] [place]  [facility] [address], [place], [place], [address] Phone: [phone]   Fax:  [phone]  Name: [name] [name] MRN: [id number] Date of Birth: [birth year]

## 2019-09-06 ENCOUNTER — Other Ambulatory Visit: Payer: Self-pay

## 2019-09-06 MED ORDER — TRAZODONE HCL 50 MG PO TABS
50.0000 mg | ORAL_TABLET | Freq: Every day | ORAL | 3 refills | Status: DC
Start: 2019-09-06 — End: 2020-05-11

## 2019-09-09 ENCOUNTER — Other Ambulatory Visit: Payer: Self-pay

## 2019-09-09 ENCOUNTER — Ambulatory Visit: Payer: Medicare Other

## 2019-09-09 DIAGNOSIS — M6281 Muscle weakness (generalized): Secondary | ICD-10-CM

## 2019-09-09 DIAGNOSIS — M25662 Stiffness of left knee, not elsewhere classified: Secondary | ICD-10-CM

## 2019-09-09 DIAGNOSIS — M25562 Pain in left knee: Secondary | ICD-10-CM | POA: Diagnosis not present

## 2019-09-09 DIAGNOSIS — R262 Difficulty in walking, not elsewhere classified: Secondary | ICD-10-CM

## 2019-09-09 NOTE — Therapy (Signed)
[facility] [address], [place], [place], [address] Phone: [phone]   Fax:  [phone]  Physical Therapy Treatment  Patient Details  Name: [name] [name] MRN: [id number] Date of Birth: Jun 19, 1955 Referring Provider (PT): Dr. Paralee Cancel   Encounter Date: 09/09/2019   PT End of Session - 09/09/19 1606    Visit Number 8    Date for PT Re-Evaluation 11/11/19    Authorization Type medicare    Progress Note Due on Visit 10    PT Start Time 1520    PT Stop Time 1619    PT Time Calculation (min) 59 min    Activity Tolerance Patient tolerated treatment well;No increased pain    Behavior During Therapy WFL for tasks assessed/performed           Past Medical History:  Diagnosis Date  . Acute meniscal tear of knee    Left knee  . Alcohol problem drinking    rehab  . Anemia    menstrual related  . Anxiety   . Arthritis    right ankle-neuropathy  . Colon polyps   . Depression   . DM (diabetes mellitus), type 2 ([place]) 12/2009   type 2  . Evalluate for OSA (obstructive sleep apnea) 08/15/2013   No OSA on sleep study but may be underestimated.-never used cpap    . GERD (gastroesophageal reflux disease)    not currently taking.  Marland Kitchen Hyperlipidemia   . Hypertension   . Hypothyroidism   . Liver disease   . Neuromuscular disorder ([place])    neropathy bilateral feet.  . Thyroid disease     Past Surgical History:  Procedure Laterality Date  . ADENOIDECTOMY    . BREAST SURGERY Right    lumpectomy-benign  . [place]   1 time  . CHOLECYSTECTOMY    . INNER EAR SURGERY     left ear had tubes put in and removed, scar tissue built up/ loss of hearing in left ear for over a year  . MASS EXCISION Left 12/16/2013   Procedure: EXCISION LEFT  DISTAL THIGH MASS;  Surgeon: Mauri Pole, MD;  [facility];  Service: Orthopedics;  Laterality: Left;  . right ankle surgery      x 2- no retained hardware    . right rotator cuff surgery      left side  . TONSILLECTOMY    . TOTAL KNEE ARTHROPLASTY  2009   right  . TOTAL KNEE ARTHROPLASTY Left 05/05/2015   Procedure: LEFT TOTAL KNEE ARTHROPLASTY;  Surgeon: Paralee Cancel, MD;  [facility];  Service: Orthopedics;  Laterality: Left;  . TOTAL KNEE REVISION Right 12/16/2013   Procedure: RIGHT TOTAL KNEE REVISION POLY EXCHANGE;  Surgeon: Mauri Pole, MD;  [facility];  Service: Orthopedics;  Laterality: Right;  . TOTAL KNEE REVISION Left 08/15/2019   Procedure: LEFT KNEE REVISION;  Surgeon: Paralee Cancel, MD;  Location: [facility];  Service: Orthopedics;  Laterality: Left;  2 hrs  . TUBAL LIGATION  1987    There were no vitals filed for this visit.   Subjective Assessment - 09/09/19 1532    Subjective I have been walking more and doing more standing.  I get sharp pains in my knee sometimes.    Pertinent History Lt TKA revision 08/15/19; chronic neck and back pain    Currently in Pain? Yes    Pain Score 2    5/10 when sharp pain occurs  Pain Location Knee    Pain Orientation Left    Pain Descriptors / Indicators Aching;Sharp;Sore    Pain Type Surgical pain    Pain Onset 1 to 4 weeks ago    Pain Frequency Intermittent    Aggravating Factors  walking, standing, bending the knee    Pain Relieving Factors ice, pain medicine, rest                             [facility] Adult PT Treatment/Exercise - 09/09/19 0001      Knee/Hip Exercises: Aerobic   Nustep seat 9: L3 x10      Knee/Hip Exercises: Machines for Strengthening   Total Gym Leg Press seat 7. 70# 2x10, Lt only 2x10      Knee/Hip Exercises: Standing   Heel Raises Left;1 set;15 reps    Rocker Board 3 minutes    SLS 10 second hold x 5 reps      Knee/Hip Exercises: Seated   Long Arc Quad Strengthening;Left;1 set;15 reps      Vasopneumatic   Number Minutes Vasopneumatic  15 minutes    Vasopnuematic Location  Knee    Vasopneumatic Pressure Medium     Vasopneumatic Temperature  3 snowflakes      Manual Therapy   Manual Therapy Soft tissue mobilization;Myofascial release    Manual therapy comments elongation to distal quad/hamstrings and proximal gastroc and patellar mobs                    PT Short Term Goals - 09/09/19 1535      PT SHORT TERM GOAL #1   Title be independent in initial HEP    Status Achieved      PT SHORT TERM GOAL #3   Title Be able to ambulate step through gait with LRAD    Baseline with cane    Status Achieved             PT Long Term Goals - 08/19/19 1313      PT LONG TERM GOAL #1   Title be independent in advanced HEP    Time 12    Period Weeks    Status New    Target Date 11/11/19      PT LONG TERM GOAL #2   Title FOTO < or = to 53% limited    Baseline 96%    Time 12    Period Weeks    Status New    Target Date 11/11/19      PT LONG TERM GOAL #3   Title knee ROM 120-0 deg for improved gait and function    Time 12    Period Weeks    Status New    Target Date 11/11/19      PT LONG TERM GOAL #4   Title able to ambulate in community with 1/10 pain at most due to improved knee strength    Baseline has had pain since original knee surgery    Time 12    Period Weeks    Status New    Target Date 11/11/19      PT LONG TERM GOAL #5   Title Lt knee and hip strength 5/5 for ability to walk outside, stairs, and perform transfers without compensations    Time 12    Period Weeks    Status New    Target Date 11/11/19      PT LONG TERM GOAL #6  Title ..Marland Kitchen                 Plan - 09/09/19 1549    Clinical Impression Statement Pt is walking all distances with a standard cane and step-through with minimal antalgia.  Pt did well with leg press today and was able to achieve neutral hip position with both bilateral and single leg press.  Pt required verbal cues for gluteal contraction for neutral pelvis with single limb exercise.  Pt with improving edema and tissue mobility.  Pt  with continued quad, hamstring and gastroc tenderness and responds well to manual therapy.  Pt will continue to benefit from skilled PT for Lt knee strength, flexibility and edema/pain management.    PT Frequency 2x / week    PT Duration 12 weeks    PT Treatment/Interventions ADLs/Self Care Home Management;Cryotherapy;Ultrasound;Traction;Moist Heat;Electrical Stimulation;Functional mobility training;Neuromuscular re-education;Therapeutic activities;Therapeutic exercise;Patient/family education;Manual techniques;Taping;Dry needling;Spinal Manipulations;Joint Manipulations    PT Next Visit Plan Lt knee ROM, strength,  proprioception, manual and edema management.  Gait training as needed    PT Home Exercise Plan [id number]    Consulted and Agree with Plan of Care Patient           Patient will benefit from skilled therapeutic intervention in order to improve the following deficits and impairments:     Visit Diagnosis: Left knee pain, unspecified chronicity  Stiffness of left knee, not elsewhere classified  Muscle weakness (generalized)  Difficulty in walking, not elsewhere classified     Problem List Patient Active Problem List   Diagnosis Date Noted  . S/P revision of total knee, left 08/15/2019  . Preop cardiovascular exam 08/13/2019  . Osteoarthritis of ankle, right 01/29/2018  . Disorder of tendon of posterior tibial muscle 07/06/2017  . Sleep related headaches 09/07/2016  . Nightmares REM-sleep type 09/07/2016  . RLS (restless legs syndrome) 09/07/2016  . Snoring 09/07/2016  . Excessive daytime sleepiness 09/07/2016  . Migraine 07/28/2016  . Solitary pulmonary nodule 06/08/2016  . S/P knee replacement 05/05/2015  . Former smoker 07/10/2014  . Shortness of breath 06/07/2014  . Chest pain 06/07/2014  . Osteoarthritis 05/16/2014  . Alcoholic fatty liver 59/16/3846  . Chronic post-traumatic stress disorder (PTSD) 05/02/2014  . Sleep disorder 05/02/2014  . Family history of  alcoholism 05/02/2014  . Macrocytosis 04/19/2014  . Fatigue 08/15/2013  . Thyroid nodule 09/17/2012  . Hypothyroid 07/21/2010  . Type II diabetes mellitus, well controlled ([place]) 06/23/2010  . Hypertension associated with diabetes ([place]) 06/23/2010  . Alcohol use disorder, severe, in sustained remission ([place]) 06/23/2010  . Hyperlipidemia associated with type 2 diabetes mellitus ([place]) 06/23/2010  . Morbid obesity ([place]) 06/23/2010  . Chronic depression 06/23/2010  . History of colonic polyps 04/12/2010    Sigurd Sos, PT 09/09/19 4:17 PM  [facility] [address], [place], [place], [address] Phone: [phone]   Fax:  [phone]  Name: [name] [name] MRN: [id number] Date of Birth: [birth year]

## 2019-09-11 ENCOUNTER — Ambulatory Visit: Payer: Medicare Other

## 2019-09-11 ENCOUNTER — Other Ambulatory Visit: Payer: Self-pay

## 2019-09-11 DIAGNOSIS — M25562 Pain in left knee: Secondary | ICD-10-CM

## 2019-09-11 DIAGNOSIS — M25662 Stiffness of left knee, not elsewhere classified: Secondary | ICD-10-CM

## 2019-09-11 DIAGNOSIS — M6281 Muscle weakness (generalized): Secondary | ICD-10-CM

## 2019-09-11 DIAGNOSIS — R262 Difficulty in walking, not elsewhere classified: Secondary | ICD-10-CM

## 2019-09-11 NOTE — Therapy (Signed)
[facility] [address] [place], [place], [address] Phone: [phone]   Fax:  [phone]  Physical Therapy Treatment  Patient Details  Name: [name] [name] MRN: [id number] Date of Birth: October 21, 1955 Referring Provider (PT): Dr. Paralee Cancel   Encounter Date: 09/11/2019   PT End of Session - 09/11/19 1615    Visit Number 9    Date for PT Re-Evaluation 11/11/19    Authorization Type medicare    Progress Note Due on Visit 10    PT Start Time 5329    PT Stop Time 1625    PT Time Calculation (min) 61 min    Activity Tolerance Patient tolerated treatment well;No increased pain    Behavior During Therapy WFL for tasks assessed/performed           Past Medical History:  Diagnosis Date  . Acute meniscal tear of knee    Left knee  . Alcohol problem drinking    rehab  . Anemia    menstrual related  . Anxiety   . Arthritis    right ankle-neuropathy  . Colon polyps   . Depression   . DM (diabetes mellitus), type 2 ([place]) 12/2009   type 2  . Evalluate for OSA (obstructive sleep apnea) 08/15/2013   No OSA on sleep study but may be underestimated.-never used cpap    . GERD (gastroesophageal reflux disease)    not currently taking.  Marland Kitchen Hyperlipidemia   . Hypertension   . Hypothyroidism   . Liver disease   . Neuromuscular disorder ([place])    neropathy bilateral feet.  . Thyroid disease     Past Surgical History:  Procedure Laterality Date  . ADENOIDECTOMY    . BREAST SURGERY Right    lumpectomy-benign  . [place]   1 time  . CHOLECYSTECTOMY    . INNER EAR SURGERY     left ear had tubes put in and removed, scar tissue built up/ loss of hearing in left ear for over a year  . MASS EXCISION Left 12/16/2013   Procedure: EXCISION LEFT  DISTAL THIGH MASS;  Surgeon: Mauri Pole, MD;  Location: [facility];  Service: Orthopedics;  Laterality: Left;  . right ankle surgery      x 2- no retained hardware    . right rotator cuff surgery      left side  . TONSILLECTOMY    . TOTAL KNEE ARTHROPLASTY  2009   right  . TOTAL KNEE ARTHROPLASTY Left 05/05/2015   Procedure: LEFT TOTAL KNEE ARTHROPLASTY;  Surgeon: Paralee Cancel, MD;  Location: [facility];  Service: Orthopedics;  Laterality: Left;  . TOTAL KNEE REVISION Right 12/16/2013   Procedure: RIGHT TOTAL KNEE REVISION POLY EXCHANGE;  Surgeon: Mauri Pole, MD;  Location: [facility];  Service: Orthopedics;  Laterality: Right;  . TOTAL KNEE REVISION Left 08/15/2019   Procedure: LEFT KNEE REVISION;  Surgeon: Paralee Cancel, MD;  Location: [facility];  Service: Orthopedics;  Laterality: Left;  2 hrs  . TUBAL LIGATION  1987    There were no vitals filed for this visit.   Subjective Assessment - 09/11/19 1528    Subjective I haven't been using my cane much.    Patient Stated Goals reduce pain, be able to walk with normal gait no AD    Currently in Pain? Yes    Pain Score 3     Pain Location Knee    Pain Orientation Right    Pain  Descriptors / Indicators Aching              [facility] PT Assessment - 09/11/19 0001      PROM   Right/Left Knee Left    Left Knee Extension -5    Left Knee Flexion 105                         [facility] Adult PT Treatment/Exercise - 09/11/19 0001      Knee/Hip Exercises: Aerobic   Nustep seat 9: L3 x10      Knee/Hip Exercises: Machines for Strengthening   Total Gym Leg Press seat 7:  bil 100# 2x10, Lt only 2x10   fatigue at of single leg set     Knee/Hip Exercises: Standing   Heel Raises Left;1 set;15 reps    Rocker Board 3 minutes    SLS 10 second hold x 5 reps    Other Standing Knee Exercises terminal knee extension red 2x10 with 5 second hold      Knee/Hip Exercises: Seated   Heel Slides AAROM;10 reps    Heel Slides Limitations using slider with       Vasopneumatic   Number Minutes Vasopneumatic  15 minutes    Vasopnuematic Location  Knee    Vasopneumatic Pressure Medium    Vasopneumatic  Temperature  3 snowflakes      Manual Therapy   Manual Therapy Soft tissue mobilization;Myofascial release    Manual therapy comments elongation to distal quad/hamstrings and proximal gastroc and patellar mobs                    PT Short Term Goals - 09/09/19 1535      PT SHORT TERM GOAL #1   Title be independent in initial HEP    Status Achieved      PT SHORT TERM GOAL #3   Title Be able to ambulate step through gait with LRAD    Baseline with cane    Status Achieved             PT Long Term Goals - 08/19/19 1313      PT LONG TERM GOAL #1   Title be independent in advanced HEP    Time 12    Period Weeks    Status New    Target Date 11/11/19      PT LONG TERM GOAL #2   Title FOTO < or = to 53% limited    Baseline 96%    Time 12    Period Weeks    Status New    Target Date 11/11/19      PT LONG TERM GOAL #3   Title knee ROM 120-0 deg for improved gait and function    Time 12    Period Weeks    Status New    Target Date 11/11/19      PT LONG TERM GOAL #4   Title able to ambulate in community with 1/10 pain at most due to improved knee strength    Baseline has had pain since original knee surgery    Time 12    Period Weeks    Status New    Target Date 11/11/19      PT LONG TERM GOAL #5   Title Lt knee and hip strength 5/5 for ability to walk outside, stairs, and perform transfers without compensations    Time 12    Period Weeks    Status New  Target Date 11/11/19      PT LONG TERM GOAL #6   Title .Marland KitchenMarland Kitchen                 Plan - 09/11/19 1543    Clinical Impression Statement Pt is walking all distances with a standard cane and step-through with minimal antalgia.  Pt did well with leg press this week and was able to achieve neutral hip position with both bilateral and single leg press.  Pt tolerated advancement of weight for double leg press.  PT provided tactile cues for quad contraction at end range.  Pt required intermittent verbal cues  for gluteal contraction for neutral pelvis with single limb exercise. Pt is challenged with terminal knee extension quad contraction.  Pt with improving edema and tissue mobility.  Pt with continued quad, hamstring and gastroc tenderness and responds well to manual therapy each session.  Pt will continue to benefit from skilled PT for Lt knee strength, flexibility and edema/pain management.    PT Frequency 2x / week    PT Duration 12 weeks    PT Treatment/Interventions ADLs/Self Care Home Management;Cryotherapy;Ultrasound;Traction;Moist Heat;Electrical Stimulation;Functional mobility training;Neuromuscular re-education;Therapeutic activities;Therapeutic exercise;Patient/family education;Manual techniques;Taping;Dry needling;Spinal Manipulations;Joint Manipulations    PT Next Visit Plan Lt knee ROM, strength, quad and glute contraction,  proprioception, manual and edema management.  Gait training as needed.  10th visit progress note.    PT Home Exercise Plan [id number]    Consulted and Agree with Plan of Care Patient           Patient will benefit from skilled therapeutic intervention in order to improve the following deficits and impairments:  Decreased activity tolerance, Decreased strength, Postural dysfunction, Improper body mechanics, Impaired flexibility, Pain, Increased muscle spasms, Decreased range of motion, Abnormal gait, Difficulty walking, Increased edema  Visit Diagnosis: Left knee pain, unspecified chronicity  Stiffness of left knee, not elsewhere classified  Muscle weakness (generalized)  Difficulty in walking, not elsewhere classified     Problem List Patient Active Problem List   Diagnosis Date Noted  . S/P revision of total knee, left 08/15/2019  . Preop cardiovascular exam 08/13/2019  . Osteoarthritis of ankle, right 01/29/2018  . Disorder of tendon of posterior tibial muscle 07/06/2017  . Sleep related headaches 09/07/2016  . Nightmares REM-sleep type 09/07/2016   . RLS (restless legs syndrome) 09/07/2016  . Snoring 09/07/2016  . Excessive daytime sleepiness 09/07/2016  . Migraine 07/28/2016  . Solitary pulmonary nodule 06/08/2016  . S/P knee replacement 05/05/2015  . Former smoker 07/10/2014  . Shortness of breath 06/07/2014  . Chest pain 06/07/2014  . Osteoarthritis 05/16/2014  . Alcoholic fatty liver 60/63/0160  . Chronic post-traumatic stress disorder (PTSD) 05/02/2014  . Sleep disorder 05/02/2014  . Family history of alcoholism 05/02/2014  . Macrocytosis 04/19/2014  . Fatigue 08/15/2013  . Thyroid nodule 09/17/2012  . Hypothyroid 07/21/2010  . Type II diabetes mellitus, well controlled ([place]) 06/23/2010  . Hypertension associated with diabetes ([place]) 06/23/2010  . Alcohol use disorder, severe, in sustained remission ([place]) 06/23/2010  . Hyperlipidemia associated with type 2 diabetes mellitus ([place]) 06/23/2010  . Morbid obesity ([place]) 06/23/2010  . Chronic depression 06/23/2010  . History of colonic polyps 04/12/2010     Sigurd Sos, PT 09/11/19 4:16 PM  [facility] [address], [place], [place], [address] Phone: [phone]   Fax:  [phone]  Name: Meadow Abramo MRN: [id number] Date of Birth: Sep 07, 1955

## 2019-09-12 ENCOUNTER — Ambulatory Visit: Payer: Medicare Other | Admitting: Physical Therapy

## 2019-09-12 ENCOUNTER — Encounter: Payer: Self-pay | Admitting: Physical Therapy

## 2019-09-12 ENCOUNTER — Other Ambulatory Visit: Payer: Self-pay

## 2019-09-12 DIAGNOSIS — M6281 Muscle weakness (generalized): Secondary | ICD-10-CM

## 2019-09-12 DIAGNOSIS — M25662 Stiffness of left knee, not elsewhere classified: Secondary | ICD-10-CM

## 2019-09-12 DIAGNOSIS — M25562 Pain in left knee: Secondary | ICD-10-CM | POA: Diagnosis not present

## 2019-09-12 DIAGNOSIS — R262 Difficulty in walking, not elsewhere classified: Secondary | ICD-10-CM

## 2019-09-12 NOTE — Therapy (Signed)
[facility] [address], [place], [place], [address] Phone: [phone]   Fax:  [phone]  Physical Therapy Treatment  Patient Details  Name: [name] [name] MRN: [id number] Date of Birth: 08-Oct-1955 Referring Provider (PT): Dr. Paralee Cancel Progress Note Reporting Period 08/19/19 to 09/12/19  See note below for Objective Data and Assessment of Progress/Goals.       Encounter Date: 09/12/2019   PT End of Session - 09/12/19 1648    Visit Number 10    Date for PT Re-Evaluation 11/11/19    Authorization Type medicare    Progress Note Due on Visit 10    PT Start Time 2482    PT Stop Time 1626    PT Time Calculation (min) 56 min    Activity Tolerance Patient tolerated treatment well;No increased pain    Behavior During Therapy WFL for tasks assessed/performed           Past Medical History:  Diagnosis Date  . Acute meniscal tear of knee    Left knee  . Alcohol problem drinking    rehab  . Anemia    menstrual related  . Anxiety   . Arthritis    right ankle-neuropathy  . Colon polyps   . Depression   . DM (diabetes mellitus), type 2 ([place]) 12/2009   type 2  . Evalluate for OSA (obstructive sleep apnea) 08/15/2013   No OSA on sleep study but may be underestimated.-never used cpap    . GERD (gastroesophageal reflux disease)    not currently taking.  Marland Kitchen Hyperlipidemia   . Hypertension   . Hypothyroidism   . Liver disease   . Neuromuscular disorder ([place])    neropathy bilateral feet.  . Thyroid disease     Past Surgical History:  Procedure Laterality Date  . ADENOIDECTOMY    . BREAST SURGERY Right    lumpectomy-benign  . [place]   1 time  . CHOLECYSTECTOMY    . INNER EAR SURGERY     left ear had tubes put in and removed, scar tissue built up/ loss of hearing in left ear for over a year  . MASS EXCISION Left 12/16/2013   Procedure: EXCISION LEFT  DISTAL THIGH MASS;  Surgeon:  Mauri Pole, MD;  Location: [facility];  Service: Orthopedics;  Laterality: Left;  . right ankle surgery      x 2- no retained hardware  . right rotator cuff surgery      left side  . TONSILLECTOMY    . TOTAL KNEE ARTHROPLASTY  2009   right  . TOTAL KNEE ARTHROPLASTY Left 05/05/2015   Procedure: LEFT TOTAL KNEE ARTHROPLASTY;  Surgeon: Paralee Cancel, MD;  [facility]: [facility];  Service: Orthopedics;  Laterality: Left;  . TOTAL KNEE REVISION Right 12/16/2013   Procedure: RIGHT TOTAL KNEE REVISION POLY EXCHANGE;  Surgeon: Mauri Pole, MD;  Location: [facility];  Service: Orthopedics;  Laterality: Right;  . TOTAL KNEE REVISION Left 08/15/2019   Procedure: LEFT KNEE REVISION;  Surgeon: Paralee Cancel, MD;  Location: [facility];  Service: Orthopedics;  Laterality: Left;  2 hrs  . TUBAL LIGATION  1987    There were no vitals filed for this visit.   Subjective Assessment - 09/12/19 1533    Subjective Pt states that her knee is a little sore. She is walking more without her cane.    Patient Stated Goals reduce pain, be able to walk with normal gait no AD  Currently in Pain? --   just soreness, no pain rating given                            [facility] Adult PT Treatment/Exercise - 09/12/19 0001      Knee/Hip Exercises: Stretches   Sports administrator Left;3 reps;20 seconds    Quad Stretch Limitations LE in chair       Knee/Hip Exercises: Aerobic   Nustep Seat 7: L2 x8 min PT present to discuss progress      Knee/Hip Exercises: Standing   Forward Step Up Left;2 sets;10 reps;Hand Hold: 2;Step Height: 6"    Forward Step Up Limitations contralateral LE flexion    Other Standing Knee Exercises sidestep over and back 4" box hurdle x10 reps       Knee/Hip Exercises: Seated   Long Arc Quad Strengthening;Left;10 reps;3 sets    [employer] Weight 4 lbs.    Heel Slides Strengthening;Left;2 sets;15 reps    Heel Slides Limitations green TB, x2 set      Vasopneumatic   Number Minutes  Vasopneumatic  15 minutes    Vasopnuematic Location  Knee    Vasopneumatic Pressure Medium    Vasopneumatic Temperature  3 snowflakes      Manual Therapy   Manual therapy comments scar mobilization to decrease adhesions                  PT Education - 09/12/19 1704    Education Details technique with therex    Person(s) Educated Patient    Methods Explanation    Comprehension Verbalized understanding            PT Short Term Goals - 09/12/19 1654      PT SHORT TERM GOAL #1   Title be independent in initial HEP    Time 4    Period Weeks    Status Achieved    Target Date 09/16/19      PT SHORT TERM GOAL #2   Title demonstrates 90 deg AROM knee flexion and 5 or less deg knee ext for improved gait    Baseline ext: -5, flexion: 105    Time 4    Period Weeks    Status Achieved    Target Date 09/16/19      PT SHORT TERM GOAL #3   Title Be able to ambulate step through gait with LRAD    Baseline ambulating with [facility]    Time 4    Period Weeks    Status Achieved    Target Date 09/16/19             PT Long Term Goals - 08/19/19 1313      PT LONG TERM GOAL #1   Title be independent in advanced HEP    Time 12    Period Weeks    Status New    Target Date 11/11/19      PT LONG TERM GOAL #2   Title FOTO < or = to 53% limited    Baseline 96%    Time 12    Period Weeks    Status New    Target Date 11/11/19      PT LONG TERM GOAL #3   Title knee ROM 120-0 deg for improved gait and function    Time 12    Period Weeks    Status New    Target Date 11/11/19      PT  LONG TERM GOAL #4   Title able to ambulate in community with 1/10 pain at most due to improved knee strength    Baseline has had pain since original knee surgery    Time 12    Period Weeks    Status New    Target Date 11/11/19      PT LONG TERM GOAL #5   Title Lt knee and hip strength 5/5 for ability to walk outside, stairs, and perform transfers without compensations    Time 12     Period Weeks    Status New    Target Date 11/11/19      PT LONG TERM GOAL #6   Title .Marland KitchenMarland Kitchen                 Plan - 09/12/19 1655    Clinical Impression Statement Pt is making good progress towards her goals. She has greater than 90 deg of knee flexion and lacks 5 deg of knee extension which meets her short term ROM goal. She has met all other short term goals at this time. Pt lacks adequate LE strength and proprioception on the Lt to complete activity such as step up without compensation. Pt's scar mobility is gradually improving. PT noticed increase in redness/warmth at the distal portion of her scar, but the pt noted this is variable throughout the day. Will continue to monitor this moving forward. Pt would continue to benefit from skilled PT to address deficits in knee ROM, strength and proprioception for independence with activity at home and in the community.    PT Frequency 2x / week    PT Duration 12 weeks    PT Treatment/Interventions ADLs/Self Care Home Management;Cryotherapy;Ultrasound;Traction;Moist Heat;Electrical Stimulation;Functional mobility training;Neuromuscular re-education;Therapeutic activities;Therapeutic exercise;Patient/family education;Manual techniques;Taping;Dry needling;Spinal Manipulations;Joint Manipulations    PT Next Visit Plan f/u with pt on decreasing frequency to 2x/week after this week-we spoke about this briefly at her last appt; Lt knee ext ROM, strength, quad and glute contraction,  proprioception, manual and edema management.  Gait training as needed.    PT Home Exercise Plan [id number]    Consulted and Agree with Plan of Care Patient           Patient will benefit from skilled therapeutic intervention in order to improve the following deficits and impairments:  Decreased activity tolerance, Decreased strength, Postural dysfunction, Improper body mechanics, Impaired flexibility, Pain, Increased muscle spasms, Decreased range of motion, Abnormal gait,  Difficulty walking, Increased edema  Visit Diagnosis: Left knee pain, unspecified chronicity  Stiffness of left knee, not elsewhere classified  Muscle weakness (generalized)  Difficulty in walking, not elsewhere classified     Problem List Patient Active Problem List   Diagnosis Date Noted  . S/P revision of total knee, left 08/15/2019  . Preop cardiovascular exam 08/13/2019  . Osteoarthritis of ankle, right 01/29/2018  . Disorder of tendon of posterior tibial muscle 07/06/2017  . Sleep related headaches 09/07/2016  . Nightmares REM-sleep type 09/07/2016  . RLS (restless legs syndrome) 09/07/2016  . Snoring 09/07/2016  . Excessive daytime sleepiness 09/07/2016  . Migraine 07/28/2016  . Solitary pulmonary nodule 06/08/2016  . S/P knee replacement 05/05/2015  . Former smoker 07/10/2014  . Shortness of breath 06/07/2014  . Chest pain 06/07/2014  . Osteoarthritis 05/16/2014  . Alcoholic fatty liver 05/02/2014  . Chronic post-traumatic stress disorder (PTSD) 05/02/2014  . Sleep disorder 05/02/2014  . Family history of alcoholism 05/02/2014  . Macrocytosis 04/19/2014  . Fatigue 08/15/2013  . Thyroid  nodule 09/17/2012  . Hypothyroid 07/21/2010  . Type II diabetes mellitus, well controlled ([place]) 06/23/2010  . Hypertension associated with diabetes ([place]) 06/23/2010  . Alcohol use disorder, severe, in sustained remission ([place]) 06/23/2010  . Hyperlipidemia associated with type 2 diabetes mellitus ([place]) 06/23/2010  . Morbid obesity ([place]) 06/23/2010  . Chronic depression 06/23/2010  . History of colonic polyps 04/12/2010   5:04 PM,09/12/19 Sherol Dade PT, DPT [facility] [address] [place], [place], [address] Phone: [phone]   Fax:  [phone]  Name: [name] [name] MRN: [id number] Date of Birth: 11-Jun-1955

## 2019-09-16 ENCOUNTER — Ambulatory Visit: Payer: Medicare Other

## 2019-09-18 ENCOUNTER — Ambulatory Visit: Payer: Medicare Other

## 2019-09-18 ENCOUNTER — Other Ambulatory Visit: Payer: Self-pay

## 2019-09-18 DIAGNOSIS — M25562 Pain in left knee: Secondary | ICD-10-CM | POA: Diagnosis not present

## 2019-09-18 DIAGNOSIS — M25662 Stiffness of left knee, not elsewhere classified: Secondary | ICD-10-CM

## 2019-09-18 DIAGNOSIS — R262 Difficulty in walking, not elsewhere classified: Secondary | ICD-10-CM

## 2019-09-18 DIAGNOSIS — M6281 Muscle weakness (generalized): Secondary | ICD-10-CM

## 2019-09-18 NOTE — Therapy (Signed)
[facility] [address] [place], [place], [address] Phone: [phone]   Fax:  [phone]  Physical Therapy Treatment  Patient Details  Name: [name] [name] MRN: [id number] Date of Birth: [birth year] Referring Provider (PT): Dr. Paralee Cancel   Encounter Date: 09/18/2019   PT End of Session - 09/18/19 1614    Visit Number 11    Date for PT Re-Evaluation 11/11/19    Authorization Type medicare    Progress Note Due on Visit 20    PT Start Time 1532    PT Stop Time 1625    PT Time Calculation (min) 53 min    Activity Tolerance Patient tolerated treatment well;No increased pain    Behavior During Therapy WFL for tasks assessed/performed           Past Medical History:  Diagnosis Date  . Acute meniscal tear of knee    Left knee  . Alcohol problem drinking    rehab  . Anemia    menstrual related  . Anxiety   . Arthritis    right ankle-neuropathy  . Colon polyps   . Depression   . DM (diabetes mellitus), type 2 ([place]) 12/2009   type 2  . Evalluate for OSA (obstructive sleep apnea) 08/15/2013   No OSA on sleep study but may be underestimated.-never used cpap    . GERD (gastroesophageal reflux disease)    not currently taking.  Marland Kitchen Hyperlipidemia   . Hypertension   . Hypothyroidism   . Liver disease   . Neuromuscular disorder ([place])    neropathy bilateral feet.  . Thyroid disease     Past Surgical History:  Procedure Laterality Date  . ADENOIDECTOMY    . BREAST SURGERY Right    lumpectomy-benign  . [place]   1 time  . CHOLECYSTECTOMY    . INNER EAR SURGERY     left ear had tubes put in and removed, scar tissue built up/ loss of hearing in left ear for over a year  . MASS EXCISION Left 12/16/2013   Procedure: EXCISION LEFT  DISTAL THIGH MASS;  Surgeon: Mauri Pole, MD;  Location: [facility];  Service: Orthopedics;  Laterality: Left;  . right ankle surgery      x 2- no retained hardware   . right rotator cuff surgery      left side  . TONSILLECTOMY    . TOTAL KNEE ARTHROPLASTY  2009   right  . TOTAL KNEE ARTHROPLASTY Left 05/05/2015   Procedure: LEFT TOTAL KNEE ARTHROPLASTY;  Surgeon: Paralee Cancel, MD;  Location: [facility];  Service: Orthopedics;  Laterality: Left;  . TOTAL KNEE REVISION Right 12/16/2013   Procedure: RIGHT TOTAL KNEE REVISION POLY EXCHANGE;  Surgeon: Mauri Pole, MD;  Location: [facility];  Service: Orthopedics;  Laterality: Right;  . TOTAL KNEE REVISION Left 08/15/2019   Procedure: LEFT KNEE REVISION;  Surgeon: Paralee Cancel, MD;  Location: [facility];  Service: Orthopedics;  Laterality: Left;  2 hrs  . TUBAL LIGATION  1987    There were no vitals filed for this visit.   Subjective Assessment - 09/18/19 1535    Subjective I had to put my dog to sleep.  I missed last appt because I was rushing my dog to the vet.  I haven't been using a cane.  I have more pain and fatigue when I don't use my cane.    Patient Stated Goals reduce pain, be able to walk with  normal gait no AD    Currently in Pain? Yes    Pain Score 1     Pain Location Knee    Pain Orientation Left    Pain Descriptors / Indicators Sore              [facility] PT Assessment - 09/18/19 0001      PROM   Left Knee Flexion 110                         [facility] Adult PT Treatment/Exercise - 09/18/19 0001      Knee/Hip Exercises: Standing   Forward Step Up Left;2 sets;10 reps;Hand Hold: 2;Step Height: 6"    Forward Step Up Limitations improved technique and quad activation with 2nd set    Rocker Board 3 minutes    Rebounder weight shifting front to back Rt and Lt x 1 minute, tandem stance Rt and Lt 2x30 seconds       Vasopneumatic   Number Minutes Vasopneumatic  10 minutes    Vasopnuematic Location  Knee    Vasopneumatic Pressure Medium    Vasopneumatic Temperature  3 snowflakes      Manual Therapy   Manual Therapy Passive ROM;Soft tissue mobilization    Manual therapy comments  scar mobilization to decrease adhesions, elongation to distal quad and hamstrings.      Passive ROM into flexion to tolerance                    PT Short Term Goals - 09/12/19 1654      PT SHORT TERM GOAL #1   Title be independent in initial HEP    Time 4    Period Weeks    Status Achieved    Target Date 09/16/19      PT SHORT TERM GOAL #2   Title demonstrates 90 deg AROM knee flexion and 5 or less deg knee ext for improved gait    Baseline ext: -5, flexion: 105    Time 4    Period Weeks    Status Achieved    Target Date 09/16/19      PT SHORT TERM GOAL #3   Title Be able to ambulate step through gait with LRAD    Baseline ambulating with [facility]    Time 4    Period Weeks    Status Achieved    Target Date 09/16/19             PT Long Term Goals - 08/19/19 1313      PT LONG TERM GOAL #1   Title be independent in advanced HEP    Time 12    Period Weeks    Status New    Target Date 11/11/19      PT LONG TERM GOAL #2   Title FOTO < or = to 53% limited    Baseline 96%    Time 12    Period Weeks    Status New    Target Date 11/11/19      PT LONG TERM GOAL #3   Title knee ROM 120-0 deg for improved gait and function    Time 12    Period Weeks    Status New    Target Date 11/11/19      PT LONG TERM GOAL #4   Title able to ambulate in community with 1/10 pain at most due to improved knee strength    Baseline has had pain since  original knee surgery    Time 12    Period Weeks    Status New    Target Date 11/11/19      PT LONG TERM GOAL #5   Title Lt knee and hip strength 5/5 for ability to walk outside, stairs, and perform transfers without compensations    Time 12    Period Weeks    Status New    Target Date 11/11/19      PT LONG TERM GOAL #6   Title .Marland KitchenMarland Kitchen                 Plan - 09/18/19 1547    Clinical Impression Statement Pt is making good progress towards her goals. Pt's scar mobility and A/ROM s/p surgery is gradually improving.  Lower portion of incision is not red today and pt reports that this varied during the day with activity.  Pt is ambulating most distances without cane and demonstrates mild asymmetry.  Pt lacks functional strength to perform step-up on the Lt without compensation.  Pt would continue to benefit from skilled PT to address deficits in knee ROM, strength and proprioception for independence with activity at home and in the community.    PT Frequency 2x / week    PT Duration 12 weeks    PT Treatment/Interventions ADLs/Self Care Home Management;Cryotherapy;Ultrasound;Traction;Moist Heat;Electrical Stimulation;Functional mobility training;Neuromuscular re-education;Therapeutic activities;Therapeutic exercise;Patient/family education;Manual techniques;Taping;Dry needling;Spinal Manipulations;Joint Manipulations    PT Next Visit Plan 2x/wk to advance Lt knee strength, ROM, functional mobility, and edema management    PT Home Exercise Plan [id number]    Consulted and Agree with Plan of Care Patient           Patient will benefit from skilled therapeutic intervention in order to improve the following deficits and impairments:  Decreased activity tolerance, Decreased strength, Postural dysfunction, Improper body mechanics, Impaired flexibility, Pain, Increased muscle spasms, Decreased range of motion, Abnormal gait, Difficulty walking, Increased edema  Visit Diagnosis: Left knee pain, unspecified chronicity  Stiffness of left knee, not elsewhere classified  Muscle weakness (generalized)  Difficulty in walking, not elsewhere classified     Problem List Patient Active Problem List   Diagnosis Date Noted  . S/P revision of total knee, left 08/15/2019  . Preop cardiovascular exam 08/13/2019  . Osteoarthritis of ankle, right 01/29/2018  . Disorder of tendon of posterior tibial muscle 07/06/2017  . Sleep related headaches 09/07/2016  . Nightmares REM-sleep type 09/07/2016  . RLS (restless legs  syndrome) 09/07/2016  . Snoring 09/07/2016  . Excessive daytime sleepiness 09/07/2016  . Migraine 07/28/2016  . Solitary pulmonary nodule 06/08/2016  . S/P knee replacement 05/05/2015  . Former smoker 07/10/2014  . Shortness of breath 06/07/2014  . Chest pain 06/07/2014  . Osteoarthritis 05/16/2014  . Alcoholic fatty liver 02/63/7858  . Chronic post-traumatic stress disorder (PTSD) 05/02/2014  . Sleep disorder 05/02/2014  . Family history of alcoholism 05/02/2014  . Macrocytosis 04/19/2014  . Fatigue 08/15/2013  . Thyroid nodule 09/17/2012  . Hypothyroid 07/21/2010  . Type II diabetes mellitus, well controlled ([place]) 06/23/2010  . Hypertension associated with diabetes ([place]) 06/23/2010  . Alcohol use disorder, severe, in sustained remission ([place]) 06/23/2010  . Hyperlipidemia associated with type 2 diabetes mellitus ([place]) 06/23/2010  . Morbid obesity ([place]) 06/23/2010  . Chronic depression 06/23/2010  . History of colonic polyps 04/12/2010     Sigurd Sos, PT 09/18/19 4:21 PM  [facility] [address], [place], [place], [address]  Phone: [phone]   Fax:  [phone]  Name: [name] [name] MRN: [id number] Date of Birth: 27-Jul-1955

## 2019-09-19 ENCOUNTER — Ambulatory Visit: Payer: Medicare Other | Admitting: Physical Therapy

## 2019-09-19 DIAGNOSIS — M25562 Pain in left knee: Secondary | ICD-10-CM

## 2019-09-19 DIAGNOSIS — R262 Difficulty in walking, not elsewhere classified: Secondary | ICD-10-CM

## 2019-09-19 DIAGNOSIS — M25662 Stiffness of left knee, not elsewhere classified: Secondary | ICD-10-CM

## 2019-09-19 DIAGNOSIS — M6281 Muscle weakness (generalized): Secondary | ICD-10-CM

## 2019-09-19 NOTE — Therapy (Signed)
[facility] [address] [place], [place], [address] Phone: [phone]   Fax:  [phone]  Physical Therapy Treatment  Patient Details  Name: [name] [name] MRN: [id number] Date of Birth: 1955/03/01 Referring Provider (PT): Dr. Paralee Cancel   Encounter Date: 09/19/2019   PT End of Session - 09/19/19 1722    Visit Number 12    Date for PT Re-Evaluation 11/11/19    Authorization Type medicare    Authorization - Number of Visits 30    Progress Note Due on Visit 20    PT Start Time 7425    PT Stop Time 1615    PT Time Calculation (min) 45 min    Activity Tolerance Patient tolerated treatment well;No increased pain           Past Medical History:  Diagnosis Date  . Acute meniscal tear of knee    Left knee  . Alcohol problem drinking    rehab  . Anemia    menstrual related  . Anxiety   . Arthritis    right ankle-neuropathy  . Colon polyps   . Depression   . DM (diabetes mellitus), type 2 ([place]) 12/2009   type 2  . Evalluate for OSA (obstructive sleep apnea) 08/15/2013   No OSA on sleep study but may be underestimated.-never used cpap    . GERD (gastroesophageal reflux disease)    not currently taking.  Marland Kitchen Hyperlipidemia   . Hypertension   . Hypothyroidism   . Liver disease   . Neuromuscular disorder ([place])    neropathy bilateral feet.  . Thyroid disease     Past Surgical History:  Procedure Laterality Date  . ADENOIDECTOMY    . BREAST SURGERY Right    lumpectomy-benign  . [place]   1 time  . CHOLECYSTECTOMY    . INNER EAR SURGERY     left ear had tubes put in and removed, scar tissue built up/ loss of hearing in left ear for over a year  . MASS EXCISION Left 12/16/2013   Procedure: EXCISION LEFT  DISTAL THIGH MASS;  Surgeon: Mauri Pole, MD;  Location: [facility];  Service: Orthopedics;  Laterality: Left;  . right ankle surgery      x 2- no retained hardware  . right rotator  cuff surgery      left side  . TONSILLECTOMY    . TOTAL KNEE ARTHROPLASTY  2009   right  . TOTAL KNEE ARTHROPLASTY Left 05/05/2015   Procedure: LEFT TOTAL KNEE ARTHROPLASTY;  Surgeon: Paralee Cancel, MD;  [facility];  Service: Orthopedics;  Laterality: Left;  . TOTAL KNEE REVISION Right 12/16/2013   Procedure: RIGHT TOTAL KNEE REVISION POLY EXCHANGE;  Surgeon: Mauri Pole, MD;  [facility]: [facility];  Service: Orthopedics;  Laterality: Right;  . TOTAL KNEE REVISION Left 08/15/2019   Procedure: LEFT KNEE REVISION;  Surgeon: Paralee Cancel, MD;  [facility]: [facility];  Service: Orthopedics;  Laterality: Left;  2 hrs  . TUBAL LIGATION  1987    There were no vitals filed for this visit.   Subjective Assessment - 09/19/19 1533    Subjective I"m pleased with how things are going.  I've been walking without anything.    Pertinent History Lt TKA revision 08/15/19; chronic neck and back pain    Currently in Pain? No/denies   just sore   Pain Score 0-No pain    Pain Location Knee    Pain Orientation Left  Pain Type Surgical pain    Aggravating Factors  standing, on my feet too much                             [facility] Adult PT Treatment/Exercise - 09/19/19 0001      Knee/Hip Exercises: Stretches   Other Knee/Hip Stretches 2nd step hip flexor stretch 10x    Other Knee/Hip Stretches 2nd step HS stretch 10x       Knee/Hip Exercises: Aerobic   Recumbent Bike bike full revolutions 5 min       Knee/Hip Exercises: Machines for Strengthening   Total Gym Leg Press Seat 7 100# bil 15x; 50# left only 15x       Knee/Hip Exercises: Standing   Forward Step Up Left;1 set;10 reps;Hand Hold: 2;Step Height: 6"    Gait Training heel taps to floor with WB on surgery leg 10x eccentric control     Other Standing Knee Exercises yellow right hip extension, WB on left     Other Standing Knee Exercises high step ladder walk; side step in ladder 2 laps each      Vasopneumatic   Number Minutes  Vasopneumatic  10 minutes    Vasopnuematic Location  Knee    Vasopneumatic Pressure Medium    Vasopneumatic Temperature  3 snowflakes      Manual Therapy   Manual therapy comments scar mobilization to decrease adhesions, elongation to distal quad and hamstrings.      Passive ROM --                    PT Short Term Goals - 09/12/19 1654      PT SHORT TERM GOAL #1   Title be independent in initial HEP    Time 4    Period Weeks    Status Achieved    Target Date 09/16/19      PT SHORT TERM GOAL #2   Title demonstrates 90 deg AROM knee flexion and 5 or less deg knee ext for improved gait    Baseline ext: -5, flexion: 105    Time 4    Period Weeks    Status Achieved    Target Date 09/16/19      PT SHORT TERM GOAL #3   Title Be able to ambulate step through gait with LRAD    Baseline ambulating with [facility]    Time 4    Period Weeks    Status Achieved    Target Date 09/16/19             PT Long Term Goals - 08/19/19 1313      PT LONG TERM GOAL #1   Title be independent in advanced HEP    Time 12    Period Weeks    Status New    Target Date 11/11/19      PT LONG TERM GOAL #2   Title FOTO < or = to 53% limited    Baseline 96%    Time 12    Period Weeks    Status New    Target Date 11/11/19      PT LONG TERM GOAL #3   Title knee ROM 120-0 deg for improved gait and function    Time 12    Period Weeks    Status New    Target Date 11/11/19      PT LONG TERM GOAL #4   Title able to ambulate  in community with 1/10 pain at most due to improved knee strength    Baseline has had pain since original knee surgery    Time 12    Period Weeks    Status New    Target Date 11/11/19      PT LONG TERM GOAL #5   Title Lt knee and hip strength 5/5 for ability to walk outside, stairs, and perform transfers without compensations    Time 12    Period Weeks    Status New    Target Date 11/11/19      PT LONG TERM GOAL #6   Title .Marland KitchenMarland Kitchen                 Plan  - 09/19/19 1724    Clinical Impression Statement The patient is progressing well and making steady gains in knee ROM.  Strength improving as well but fatigues quickly with higher number of repetitions, with single leg standing and with eccentric lowering type ex (step downs).  Difficulty stabilizing with proprioceptive/balance ex's when standing on surgery leg, requires CGA/min assist.  Some distal peri-incisional redness post exercise but improved following vasocompression/cryotherapy.  Therapist monitoring response with all interventions.    Comorbidities bil knee replacements, ankle ORIF, chronic LBP    Rehab Potential Excellent    PT Frequency 2x / week    PT Duration 12 weeks    PT Treatment/Interventions ADLs/Self Care Home Management;Cryotherapy;Ultrasound;Traction;Moist Heat;Electrical Stimulation;Functional mobility training;Neuromuscular re-education;Therapeutic activities;Therapeutic exercise;Patient/family education;Manual techniques;Taping;Dry needling;Spinal Manipulations;Joint Manipulations    PT Next Visit Plan 2x/wk to advance Lt knee strength, ROM, functional mobility, and edema management    PT Home Exercise Plan [id number]           Patient will benefit from skilled therapeutic intervention in order to improve the following deficits and impairments:  Decreased activity tolerance, Decreased strength, Postural dysfunction, Improper body mechanics, Impaired flexibility, Pain, Increased muscle spasms, Decreased range of motion, Abnormal gait, Difficulty walking, Increased edema  Visit Diagnosis: Left knee pain, unspecified chronicity  Stiffness of left knee, not elsewhere classified  Muscle weakness (generalized)  Difficulty in walking, not elsewhere classified     Problem List Patient Active Problem List   Diagnosis Date Noted  . S/P revision of total knee, left 08/15/2019  . Preop cardiovascular exam 08/13/2019  . Osteoarthritis of ankle, right 01/29/2018  . Disorder  of tendon of posterior tibial muscle 07/06/2017  . Sleep related headaches 09/07/2016  . Nightmares REM-sleep type 09/07/2016  . RLS (restless legs syndrome) 09/07/2016  . Snoring 09/07/2016  . Excessive daytime sleepiness 09/07/2016  . Migraine 07/28/2016  . Solitary pulmonary nodule 06/08/2016  . S/P knee replacement 05/05/2015  . Former smoker 07/10/2014  . Shortness of breath 06/07/2014  . Chest pain 06/07/2014  . Osteoarthritis 05/16/2014  . Alcoholic fatty liver 94/17/4081  . Chronic post-traumatic stress disorder (PTSD) 05/02/2014  . Sleep disorder 05/02/2014  . Family history of alcoholism 05/02/2014  . Macrocytosis 04/19/2014  . Fatigue 08/15/2013  . Thyroid nodule 09/17/2012  . Hypothyroid 07/21/2010  . Type II diabetes mellitus, well controlled ([place]) 06/23/2010  . Hypertension associated with diabetes ([place]) 06/23/2010  . Alcohol use disorder, severe, in sustained remission ([place]) 06/23/2010  . Hyperlipidemia associated with type 2 diabetes mellitus ([place]) 06/23/2010  . Morbid obesity ([place]) 06/23/2010  . Chronic depression 06/23/2010  . History of colonic polyps 04/12/2010   Ruben Im, PT 09/19/19 5:31 PM Phone: [phone] Fax: [phone] Alvera Singh 09/19/2019, 5:31 PM  [facility] [address], [place], [place], [address] Phone: [phone]   Fax:  [phone]  Name: [name] [name] MRN: [id number] Date of Birth: [birth year]

## 2019-09-24 ENCOUNTER — Ambulatory Visit: Payer: Medicare Other | Attending: Orthopedic Surgery | Admitting: Physical Therapy

## 2019-09-24 ENCOUNTER — Encounter: Payer: Self-pay | Admitting: Physical Therapy

## 2019-09-24 ENCOUNTER — Other Ambulatory Visit: Payer: Self-pay

## 2019-09-24 DIAGNOSIS — M25562 Pain in left knee: Secondary | ICD-10-CM | POA: Insufficient documentation

## 2019-09-24 DIAGNOSIS — M6281 Muscle weakness (generalized): Secondary | ICD-10-CM | POA: Insufficient documentation

## 2019-09-24 DIAGNOSIS — R262 Difficulty in walking, not elsewhere classified: Secondary | ICD-10-CM | POA: Diagnosis present

## 2019-09-24 DIAGNOSIS — M25662 Stiffness of left knee, not elsewhere classified: Secondary | ICD-10-CM | POA: Insufficient documentation

## 2019-09-24 NOTE — Therapy (Signed)
[facility] [address], [place], [place], [address] Phone: [phone]   Fax:  [phone]  Physical Therapy Treatment  Patient Details  Name: [name] [name] MRN: [id number] Date of Birth: 1955-06-16 Referring Provider (PT): Dr. Paralee Cancel   Encounter Date: 09/24/2019   PT End of Session - 09/24/19 1642    Visit Number 13    Date for PT Re-Evaluation 11/11/19    Authorization Type medicare    Authorization - Number of Visits 30    Progress Note Due on Visit 20    PT Start Time 8099    PT Stop Time 1630    PT Time Calculation (min) 57 min    Activity Tolerance Patient tolerated treatment well;No increased pain    Behavior During Therapy WFL for tasks assessed/performed           Past Medical History:  Diagnosis Date  . Acute meniscal tear of knee    Left knee  . Alcohol problem drinking    rehab  . Anemia    menstrual related  . Anxiety   . Arthritis    right ankle-neuropathy  . Colon polyps   . Depression   . DM (diabetes mellitus), type 2 ([place]) 12/2009   type 2  . Evalluate for OSA (obstructive sleep apnea) 08/15/2013   No OSA on sleep study but may be underestimated.-never used cpap    . GERD (gastroesophageal reflux disease)    not currently taking.  Marland Kitchen Hyperlipidemia   . Hypertension   . Hypothyroidism   . Liver disease   . Neuromuscular disorder ([place])    neropathy bilateral feet.  . Thyroid disease     Past Surgical History:  Procedure Laterality Date  . ADENOIDECTOMY    . BREAST SURGERY Right    lumpectomy-benign  . [place]   1 time  . CHOLECYSTECTOMY    . INNER EAR SURGERY     left ear had tubes put in and removed, scar tissue built up/ loss of hearing in left ear for over a year  . MASS EXCISION Left 12/16/2013   Procedure: EXCISION LEFT  DISTAL THIGH MASS;  Surgeon: Mauri Pole, MD;  Location: [facility];  Service: Orthopedics;  Laterality: Left;  . right ankle  surgery      x 2- no retained hardware  . right rotator cuff surgery      left side  . TONSILLECTOMY    . TOTAL KNEE ARTHROPLASTY  2009   right  . TOTAL KNEE ARTHROPLASTY Left 05/05/2015   Procedure: LEFT TOTAL KNEE ARTHROPLASTY;  Surgeon: Paralee Cancel, MD;  Location: [facility];  Service: Orthopedics;  Laterality: Left;  . TOTAL KNEE REVISION Right 12/16/2013   Procedure: RIGHT TOTAL KNEE REVISION POLY EXCHANGE;  Surgeon: Mauri Pole, MD;  Location: [facility];  Service: Orthopedics;  Laterality: Right;  . TOTAL KNEE REVISION Left 08/15/2019   Procedure: LEFT KNEE REVISION;  Surgeon: Paralee Cancel, MD;  Location: [facility];  Service: Orthopedics;  Laterality: Left;  2 hrs  . TUBAL LIGATION  1987    There were no vitals filed for this visit.   Subjective Assessment - 09/24/19 1538    Subjective Pt states that things are going well. She has some itching around her knee.    Pertinent History Lt TKA revision 08/15/19; chronic neck and back pain    Currently in Pain? No/denies  [facility] PT Assessment - 09/24/19 0001      Observation/Other Assessments   Skin Integrity redness and warmth over inferior portion of surgical incision-pt encouraged to reach out to surgeon regarding this as it appears to have worsened in the last week                          [facility] Adult PT Treatment/Exercise - 09/24/19 0001      Knee/Hip Exercises: Stretches   Knee: Self-Stretch to increase Flexion Left;10 seconds    Knee: Self-Stretch Limitations on 2nd step x10 reps       Knee/Hip Exercises: Standing   Step Down Left;1 set;20 reps;Hand Hold: 1;Step Height: 6"    SLS with Vectors Lt 1 finger support     Other Standing Knee Exercises sidestep yellow TB around feet x20 reps     Other Standing Knee Exercises Lt hamstring curl #2 x20 reps       Vasopneumatic   Number Minutes Vasopneumatic  15 minutes    Vasopnuematic Location  Knee    Vasopneumatic Pressure Medium    Vasopneumatic  Temperature  3 snowflakes      Manual Therapy   Manual therapy comments scar mobilization Lt incision avoiding inferior portion                  PT Education - 09/24/19 1642    Education Details technique with therex; encouraged pt to notify surgeon regarding increase in redness/warmth around inferior border of her surgical incision    Person(s) Educated Patient    Methods Explanation;Verbal cues    Comprehension Verbalized understanding;Returned demonstration            PT Short Term Goals - 09/12/19 1654      PT SHORT TERM GOAL #1   Title be independent in initial HEP    Time 4    Period Weeks    Status Achieved    Target Date 09/16/19      PT SHORT TERM GOAL #2   Title demonstrates 90 deg AROM knee flexion and 5 or less deg knee ext for improved gait    Baseline ext: -5, flexion: 105    Time 4    Period Weeks    Status Achieved    Target Date 09/16/19      PT SHORT TERM GOAL #3   Title Be able to ambulate step through gait with LRAD    Baseline ambulating with [facility]    Time 4    Period Weeks    Status Achieved    Target Date 09/16/19             PT Long Term Goals - 08/19/19 1313      PT LONG TERM GOAL #1   Title be independent in advanced HEP    Time 12    Period Weeks    Status New    Target Date 11/11/19      PT LONG TERM GOAL #2   Title FOTO < or = to 53% limited    Baseline 96%    Time 12    Period Weeks    Status New    Target Date 11/11/19      PT LONG TERM GOAL #3   Title knee ROM 120-0 deg for improved gait and function    Time 12    Period Weeks    Status New    Target Date 11/11/19  PT LONG TERM GOAL #4   Title able to ambulate in community with 1/10 pain at most due to improved knee strength    Baseline has had pain since original knee surgery    Time 12    Period Weeks    Status New    Target Date 11/11/19      PT LONG TERM GOAL #5   Title Lt knee and hip strength 5/5 for ability to walk outside, stairs, and  perform transfers without compensations    Time 12    Period Weeks    Status New    Target Date 11/11/19      PT LONG TERM GOAL #6   Title .Marland KitchenMarland Kitchen                 Plan - 09/24/19 1643    Clinical Impression Statement Today's session continued with therex to increase knee ROM, proprioception and functional strength. Pt was able to demonstrate good eccentric control during lateral step downs and had no increase in pain with this. She had difficulty with single leg vector stance, requiring atleast 1 finger support. Pt's scar mobility is greatly improved, with more adhesions decreased during mobilization end of session. Pt has increase in redness/swelling of the inferior portion of her scar, so PT encouraged her to notify surgeon regarding this. She verbalized understanding.    Comorbidities bil knee replacements, ankle ORIF, chronic LBP    Rehab Potential Excellent    PT Frequency 2x / week    PT Duration 12 weeks    PT Treatment/Interventions ADLs/Self Care Home Management;Cryotherapy;Ultrasound;Traction;Moist Heat;Electrical Stimulation;Functional mobility training;Neuromuscular re-education;Therapeutic activities;Therapeutic exercise;Patient/family education;Manual techniques;Taping;Dry needling;Spinal Manipulations;Joint Manipulations    PT Next Visit Plan progress Lt knee strength, ROM, functional mobility, and edema management    PT Home Exercise Plan [id number]           Patient will benefit from skilled therapeutic intervention in order to improve the following deficits and impairments:  Decreased activity tolerance, Decreased strength, Postural dysfunction, Improper body mechanics, Impaired flexibility, Pain, Increased muscle spasms, Decreased range of motion, Abnormal gait, Difficulty walking, Increased edema  Visit Diagnosis: Left knee pain, unspecified chronicity  Stiffness of left knee, not elsewhere classified  Muscle weakness (generalized)  Difficulty in walking, not  elsewhere classified     Problem List Patient Active Problem List   Diagnosis Date Noted  . S/P revision of total knee, left 08/15/2019  . Preop cardiovascular exam 08/13/2019  . Osteoarthritis of ankle, right 01/29/2018  . Disorder of tendon of posterior tibial muscle 07/06/2017  . Sleep related headaches 09/07/2016  . Nightmares REM-sleep type 09/07/2016  . RLS (restless legs syndrome) 09/07/2016  . Snoring 09/07/2016  . Excessive daytime sleepiness 09/07/2016  . Migraine 07/28/2016  . Solitary pulmonary nodule 06/08/2016  . S/P knee replacement 05/05/2015  . Former smoker 07/10/2014  . Shortness of breath 06/07/2014  . Chest pain 06/07/2014  . Osteoarthritis 05/16/2014  . Alcoholic fatty liver 39/04/90  . Chronic post-traumatic stress disorder (PTSD) 05/02/2014  . Sleep disorder 05/02/2014  . Family history of alcoholism 05/02/2014  . Macrocytosis 04/19/2014  . Fatigue 08/15/2013  . Thyroid nodule 09/17/2012  . Hypothyroid 07/21/2010  . Type II diabetes mellitus, well controlled ([place]) 06/23/2010  . Hypertension associated with diabetes ([place]) 06/23/2010  . Alcohol use disorder, severe, in sustained remission ([place]) 06/23/2010  . Hyperlipidemia associated with type 2 diabetes mellitus ([place]) 06/23/2010  . Morbid obesity ([place]) 06/23/2010  . Chronic depression 06/23/2010  . History  of colonic polyps 04/12/2010    4:47 PM,09/24/19 Sherol Dade PT, DPT [place] [facility] [address], [place], [place], [address] Phone: [phone]   Fax:  [phone]  Name: [name] [name] MRN: [id number] Date of Birth: 1955-06-27

## 2019-09-26 ENCOUNTER — Ambulatory Visit: Payer: Medicare Other | Admitting: Physical Therapy

## 2019-09-26 ENCOUNTER — Encounter: Payer: Self-pay | Admitting: Physical Therapy

## 2019-09-26 ENCOUNTER — Other Ambulatory Visit: Payer: Self-pay

## 2019-09-26 DIAGNOSIS — R262 Difficulty in walking, not elsewhere classified: Secondary | ICD-10-CM

## 2019-09-26 DIAGNOSIS — M6281 Muscle weakness (generalized): Secondary | ICD-10-CM

## 2019-09-26 DIAGNOSIS — M25562 Pain in left knee: Secondary | ICD-10-CM

## 2019-09-26 DIAGNOSIS — M25662 Stiffness of left knee, not elsewhere classified: Secondary | ICD-10-CM

## 2019-09-26 NOTE — Therapy (Signed)
[facility] [address] [place], [place], [address] Phone: [phone]   Fax:  [phone]  Physical Therapy Treatment  Patient Details  Name: [name] [name] MRN: [id number] Date of Birth: July 30, 1955 Referring Provider (PT): Dr. Paralee Cancel   Encounter Date: 09/26/2019   PT End of Session - 09/26/19 1619    Visit Number 14    Date for PT Re-Evaluation 11/11/19    Authorization Type medicare    Authorization - Number of Visits 30    Progress Note Due on Visit 20    PT Start Time 4580    PT Stop Time 1624    PT Time Calculation (min) 53 min    Activity Tolerance Patient tolerated treatment well;No increased pain    Behavior During Therapy WFL for tasks assessed/performed           Past Medical History:  Diagnosis Date  . Acute meniscal tear of knee    Left knee  . Alcohol problem drinking    rehab  . Anemia    menstrual related  . Anxiety   . Arthritis    right ankle-neuropathy  . Colon polyps   . Depression   . DM (diabetes mellitus), type 2 ([place]) 12/2009   type 2  . Evalluate for OSA (obstructive sleep apnea) 08/15/2013   No OSA on sleep study but may be underestimated.-never used cpap    . GERD (gastroesophageal reflux disease)    not currently taking.  Marland Kitchen Hyperlipidemia   . Hypertension   . Hypothyroidism   . Liver disease   . Neuromuscular disorder ([place])    neropathy bilateral feet.  . Thyroid disease     Past Surgical History:  Procedure Laterality Date  . ADENOIDECTOMY    . BREAST SURGERY Right    lumpectomy-benign  . [place]   1 time  . CHOLECYSTECTOMY    . INNER EAR SURGERY     left ear had tubes put in and removed, scar tissue built up/ loss of hearing in left ear for over a year  . MASS EXCISION Left 12/16/2013   Procedure: EXCISION LEFT  DISTAL THIGH MASS;  Surgeon: Mauri Pole, MD;  Location: [facility];  Service: Orthopedics;  Laterality: Left;  . right ankle  surgery      x 2- no retained hardware  . right rotator cuff surgery      left side  . TONSILLECTOMY    . TOTAL KNEE ARTHROPLASTY  2009   right  . TOTAL KNEE ARTHROPLASTY Left 05/05/2015   Procedure: LEFT TOTAL KNEE ARTHROPLASTY;  Surgeon: Paralee Cancel, MD;  [facility]: [facility];  Service: Orthopedics;  Laterality: Left;  . TOTAL KNEE REVISION Right 12/16/2013   Procedure: RIGHT TOTAL KNEE REVISION POLY EXCHANGE;  Surgeon: Mauri Pole, MD;  Location: [facility];  Service: Orthopedics;  Laterality: Right;  . TOTAL KNEE REVISION Left 08/15/2019   Procedure: LEFT KNEE REVISION;  Surgeon: Paralee Cancel, MD;  Location: [facility];  Service: Orthopedics;  Laterality: Left;  2 hrs  . TUBAL LIGATION  1987    There were no vitals filed for this visit.   Subjective Assessment - 09/26/19 1537    Subjective Pt states that things are going well. She had increased knee pain last night after being on her feet most of the day. No pain currently. She saw the PA yesterday who prescribed her an antibiotic.    Pertinent History Lt TKA revision 08/15/19; chronic  neck and back pain    Currently in Pain? No/denies                             [facility] Adult PT Treatment/Exercise - 09/26/19 0001      Knee/Hip Exercises: Aerobic   Recumbent Bike L3 full revolutions x5 min       Knee/Hip Exercises: Machines for Strengthening   Total Gym Leg Press Seat 7: Lt only #100 2x10 reps       Knee/Hip Exercises: Standing   SLS with Vectors on Lt 2 finger support on foam x5 reps-3 sec     Other Standing Knee Exercises 3 way hip slider with Lt mini lunge x10 reps each direction      Knee/Hip Exercises: Seated   Hamstring Curl Left;Strengthening;2 sets;10 reps      Vasopneumatic   Number Minutes Vasopneumatic  10 minutes    Vasopnuematic Location  Knee    Vasopneumatic Pressure Medium    Vasopneumatic Temperature  3 snowflakes      Manual Therapy   Joint Mobilization Lt patella mobilization inferior and  superior directions; Lt terminal extension stretch with tibial ER x10 reps                   PT Education - 09/26/19 1619    Education Details technique with therex    Person(s) Educated Patient    Methods Explanation    Comprehension Verbalized understanding;Verbal cues required            PT Short Term Goals - 09/26/19 1620      PT SHORT TERM GOAL #1   Title be independent in initial HEP    Time 4    Period Weeks    Status Achieved    Target Date 09/16/19      PT SHORT TERM GOAL #2   Title demonstrates 90 deg AROM knee flexion and 5 or less deg knee ext for improved gait    Baseline ext: -5, flexion: 110    Time 4    Period Weeks    Status Achieved    Target Date 09/16/19      PT SHORT TERM GOAL #3   Title Be able to ambulate step through gait with LRAD    Baseline ambulating with SPC    Time 4    Period Weeks    Status Achieved    Target Date 09/16/19             PT Long Term Goals - 09/26/19 1622      PT LONG TERM GOAL #1   Title be independent in advanced HEP    Time 12    Period Weeks    Status New      PT LONG TERM GOAL #2   Title FOTO < or = to 53% limited    Baseline 96%    Time 12    Period Weeks    Status New      PT LONG TERM GOAL #3   Title knee ROM 120-0 deg for improved gait and function    Baseline 110 deg flexion    Time 12    Period Weeks    Status On-going      PT LONG TERM GOAL #4   Title able to ambulate in community with 1/10 pain at most due to improved knee strength    Baseline fluctuates    Time 12  Period Weeks    Status New      PT LONG TERM GOAL #5   Title Lt knee and hip strength 5/5 for ability to walk outside, stairs, and perform transfers without compensations    Time 12    Period Weeks    Status On-going      PT LONG TERM GOAL #6   Title .Marland KitchenMarland Kitchen                 Plan - 09/26/19 1626    Clinical Impression Statement Pt was provided an antibiotic for the new redness found on the front of  her knee.  She reports being able to be on her feet most of the day yesterday, but this caused increase in pain through the night. Session continued with focus on LT strength, proprioception and manual techniques to increase terminal knee extension. Pt's flexion is within normal limits, but she lacks ~5 deg of active knee extension. Ended with ice/elevation to decrease swelling. Will continue with current POC.    Comorbidities bil knee replacements, ankle ORIF, chronic LBP    Rehab Potential Excellent    PT Frequency 2x / week    PT Duration 12 weeks    PT Treatment/Interventions ADLs/Self Care Home Management;Cryotherapy;Ultrasound;Traction;Moist Heat;Electrical Stimulation;Functional mobility training;Neuromuscular re-education;Therapeutic activities;Therapeutic exercise;Patient/family education;Manual techniques;Taping;Dry needling;Spinal Manipulations;Joint Manipulations    PT Next Visit Plan progress Lt knee strength, knee ROM measure, functional mobility, and edema management    PT Home Exercise Plan [id number]           Patient will benefit from skilled therapeutic intervention in order to improve the following deficits and impairments:  Decreased activity tolerance, Decreased strength, Postural dysfunction, Improper body mechanics, Impaired flexibility, Pain, Increased muscle spasms, Decreased range of motion, Abnormal gait, Difficulty walking, Increased edema  Visit Diagnosis: Left knee pain, unspecified chronicity  Stiffness of left knee, not elsewhere classified  Muscle weakness (generalized)  Difficulty in walking, not elsewhere classified     Problem List Patient Active Problem List   Diagnosis Date Noted  . S/P revision of total knee, left 08/15/2019  . Preop cardiovascular exam 08/13/2019  . Osteoarthritis of ankle, right 01/29/2018  . Disorder of tendon of posterior tibial muscle 07/06/2017  . Sleep related headaches 09/07/2016  . Nightmares REM-sleep type 09/07/2016   . RLS (restless legs syndrome) 09/07/2016  . Snoring 09/07/2016  . Excessive daytime sleepiness 09/07/2016  . Migraine 07/28/2016  . Solitary pulmonary nodule 06/08/2016  . S/P knee replacement 05/05/2015  . Former smoker 07/10/2014  . Shortness of breath 06/07/2014  . Chest pain 06/07/2014  . Osteoarthritis 05/16/2014  . Alcoholic fatty liver 76/28/3151  . Chronic post-traumatic stress disorder (PTSD) 05/02/2014  . Sleep disorder 05/02/2014  . Family history of alcoholism 05/02/2014  . Macrocytosis 04/19/2014  . Fatigue 08/15/2013  . Thyroid nodule 09/17/2012  . Hypothyroid 07/21/2010  . Type II diabetes mellitus, well controlled ([place]) 06/23/2010  . Hypertension associated with diabetes ([place]) 06/23/2010  . Alcohol use disorder, severe, in sustained remission ([place]) 06/23/2010  . Hyperlipidemia associated with type 2 diabetes mellitus ([place]) 06/23/2010  . Morbid obesity ([place]) 06/23/2010  . Chronic depression 06/23/2010  . History of colonic polyps 04/12/2010    4:40 PM,09/26/19 Sherol Dade PT, DPT [place] [facility] [address], [place], [place], [address] Phone: [phone]   Fax:  [phone]  Name: [name] [name] MRN: [id number] Date of Birth: 1955-10-04

## 2019-09-30 ENCOUNTER — Other Ambulatory Visit: Payer: Self-pay

## 2019-09-30 ENCOUNTER — Ambulatory Visit: Payer: Medicare Other

## 2019-09-30 DIAGNOSIS — M6281 Muscle weakness (generalized): Secondary | ICD-10-CM

## 2019-09-30 DIAGNOSIS — M25662 Stiffness of left knee, not elsewhere classified: Secondary | ICD-10-CM

## 2019-09-30 DIAGNOSIS — M25562 Pain in left knee: Secondary | ICD-10-CM | POA: Diagnosis not present

## 2019-09-30 DIAGNOSIS — R262 Difficulty in walking, not elsewhere classified: Secondary | ICD-10-CM

## 2019-09-30 NOTE — Therapy (Signed)
[facility] [address] [place], [place], [address] Phone: [phone]   Fax:  [phone]  Physical Therapy Treatment  Patient Details  Name: [name] [name] MRN: [id number] Date of Birth: 31-Jul-1955 Referring Provider (PT): Dr. Paralee Cancel   Encounter Date: 09/30/2019   PT End of Session - 09/30/19 1621    Visit Number 15    Date for PT Re-Evaluation 11/11/19    Authorization Type medicare    Progress Note Due on Visit 20    PT Start Time 1532    PT Stop Time 4163    PT Time Calculation (min) 61 min    Activity Tolerance Patient tolerated treatment well;No increased pain    Behavior During Therapy WFL for tasks assessed/performed           Past Medical History:  Diagnosis Date  . Acute meniscal tear of knee    Left knee  . Alcohol problem drinking    rehab  . Anemia    menstrual related  . Anxiety   . Arthritis    right ankle-neuropathy  . Colon polyps   . Depression   . DM (diabetes mellitus), type 2 ([place]) 12/2009   type 2  . Evalluate for OSA (obstructive sleep apnea) 08/15/2013   No OSA on sleep study but may be underestimated.-never used cpap    . GERD (gastroesophageal reflux disease)    not currently taking.  Marland Kitchen Hyperlipidemia   . Hypertension   . Hypothyroidism   . Liver disease   . Neuromuscular disorder ([place])    neropathy bilateral feet.  . Thyroid disease     Past Surgical History:  Procedure Laterality Date  . ADENOIDECTOMY    . BREAST SURGERY Right    lumpectomy-benign  . [place]   1 time  . CHOLECYSTECTOMY    . INNER EAR SURGERY     left ear had tubes put in and removed, scar tissue built up/ loss of hearing in left ear for over a year  . MASS EXCISION Left 12/16/2013   Procedure: EXCISION LEFT  DISTAL THIGH MASS;  Surgeon: Mauri Pole, MD;  Location: [facility];  Service: Orthopedics;  Laterality: Left;  . right ankle surgery      x 2- no retained hardware   . right rotator cuff surgery      left side  . TONSILLECTOMY    . TOTAL KNEE ARTHROPLASTY  2009   right  . TOTAL KNEE ARTHROPLASTY Left 05/05/2015   Procedure: LEFT TOTAL KNEE ARTHROPLASTY;  Surgeon: Paralee Cancel, MD;  [facility];  Service: Orthopedics;  Laterality: Left;  . TOTAL KNEE REVISION Right 12/16/2013   Procedure: RIGHT TOTAL KNEE REVISION POLY EXCHANGE;  Surgeon: Mauri Pole, MD;  [facility];  Service: Orthopedics;  Laterality: Right;  . TOTAL KNEE REVISION Left 08/15/2019   Procedure: LEFT KNEE REVISION;  Surgeon: Paralee Cancel, MD;  [facility];  Service: Orthopedics;  Laterality: Left;  2 hrs  . TUBAL LIGATION  1987    There were no vitals filed for this visit.   Subjective Assessment - 09/30/19 1539    Subjective I saw the MD today and he gave me more antibiotics to keep taking due to my knee being so red.  MD was pleased with my progress.    Currently in Pain? Yes    Pain Score 3     Pain Location Knee    Pain Orientation Left  Pain Descriptors / Indicators Sore    Pain Type Surgical pain    Pain Onset More than a month ago    Pain Frequency Intermittent    Aggravating Factors  standing, activity    Pain Relieving Factors ice, pain medicine, rest                             [facility] Adult PT Treatment/Exercise - 09/30/19 0001      Knee/Hip Exercises: Aerobic   Recumbent Bike L3 full revolutions x6  min       Knee/Hip Exercises: Machines for Strengthening   Total Gym Leg Press Seat 7: Lt only #100 2x10 reps       Knee/Hip Exercises: Standing   SLS with Vectors on Lt 2 finger support on foam x5 reps-3 sec     Walking with Sports Cord 25# x10 forward and reverse      Vasopneumatic   Number Minutes Vasopneumatic  15 minutes    Vasopnuematic Location  Knee    Vasopneumatic Pressure Medium    Vasopneumatic Temperature  3 snowflakes      Manual Therapy   Manual therapy comments scar mobilization Lt incision avoiding  inferior portion    Joint Mobilization Lt patella mobilization inferior and superior directions; Lt terminal extension stretch with tibial ER x10 reps                     PT Short Term Goals - 09/26/19 1620      PT SHORT TERM GOAL #1   Title be independent in initial HEP    Time 4    Period Weeks    Status Achieved    Target Date 09/16/19      PT SHORT TERM GOAL #2   Title demonstrates 90 deg AROM knee flexion and 5 or less deg knee ext for improved gait    Baseline ext: -5, flexion: 110    Time 4    Period Weeks    Status Achieved    Target Date 09/16/19      PT SHORT TERM GOAL #3   Title Be able to ambulate step through gait with LRAD    Baseline ambulating with [facility]    Time 4    Period Weeks    Status Achieved    Target Date 09/16/19             PT Long Term Goals - 09/30/19 1549      PT LONG TERM GOAL #1   Title be independent in advanced HEP    Time 12    Period Weeks    Status On-going      PT LONG TERM GOAL #4   Title able to ambulate in community with 1/10 pain at most due to improved knee strength    Baseline 2-3/10    Time 12    Period Weeks    Status On-going                 Plan - 09/30/19 1555    Clinical Impression Statement Pt continues to make steady progress s/p Lt TKA.  Pt saw MD today and he prescribed additional antibiotics to address redness around the incision.  Session focused on Lt knee strength and proprioception.  Pt required minor verbal cues for alignment and control with exercise.  Pt will continue to benefit from skilled PT to address Lt knee and hip strength, ROM and  edema management.    PT Frequency 2x / week    PT Duration 12 weeks    PT Treatment/Interventions ADLs/Self Care Home Management;Cryotherapy;Ultrasound;Traction;Moist Heat;Electrical Stimulation;Functional mobility training;Neuromuscular re-education;Therapeutic activities;Therapeutic exercise;Patient/family education;Manual techniques;Taping;Dry  needling;Spinal Manipulations;Joint Manipulations    PT Next Visit Plan progress Lt knee strength, knee ROM measure, functional mobility, and edema management    PT Home Exercise Plan [id number]    Consulted and Agree with Plan of Care Patient           Patient will benefit from skilled therapeutic intervention in order to improve the following deficits and impairments:  Decreased activity tolerance, Decreased strength, Postural dysfunction, Improper body mechanics, Impaired flexibility, Pain, Increased muscle spasms, Decreased range of motion, Abnormal gait, Difficulty walking, Increased edema  Visit Diagnosis: Left knee pain, unspecified chronicity  Stiffness of left knee, not elsewhere classified  Muscle weakness (generalized)  Difficulty in walking, not elsewhere classified     Problem List Patient Active Problem List   Diagnosis Date Noted  . S/P revision of total knee, left 08/15/2019  . Preop cardiovascular exam 08/13/2019  . Osteoarthritis of ankle, right 01/29/2018  . Disorder of tendon of posterior tibial muscle 07/06/2017  . Sleep related headaches 09/07/2016  . Nightmares REM-sleep type 09/07/2016  . RLS (restless legs syndrome) 09/07/2016  . Snoring 09/07/2016  . Excessive daytime sleepiness 09/07/2016  . Migraine 07/28/2016  . Solitary pulmonary nodule 06/08/2016  . S/P knee replacement 05/05/2015  . Former smoker 07/10/2014  . Shortness of breath 06/07/2014  . Chest pain 06/07/2014  . Osteoarthritis 05/16/2014  . Alcoholic fatty liver 62/22/9798  . Chronic post-traumatic stress disorder (PTSD) 05/02/2014  . Sleep disorder 05/02/2014  . Family history of alcoholism 05/02/2014  . Macrocytosis 04/19/2014  . Fatigue 08/15/2013  . Thyroid nodule 09/17/2012  . Hypothyroid 07/21/2010  . Type II diabetes mellitus, well controlled ([place]) 06/23/2010  . Hypertension associated with diabetes ([place]) 06/23/2010  . Alcohol use disorder, severe, in sustained remission  ([place]) 06/23/2010  . Hyperlipidemia associated with type 2 diabetes mellitus ([place]) 06/23/2010  . Morbid obesity ([place]) 06/23/2010  . Chronic depression 06/23/2010  . History of colonic polyps 04/12/2010    Sigurd Sos, PT 09/30/19 4:23 PM  [facility] [address] [place], [place], [address] Phone: [phone]   Fax:  [phone]  Name: [name] [name] MRN: [id number] Date of Birth: 08-26-1955

## 2019-10-03 ENCOUNTER — Encounter: Payer: Self-pay | Admitting: Physical Therapy

## 2019-10-03 ENCOUNTER — Other Ambulatory Visit: Payer: Self-pay

## 2019-10-03 ENCOUNTER — Ambulatory Visit: Payer: Medicare Other | Admitting: Physical Therapy

## 2019-10-03 DIAGNOSIS — R262 Difficulty in walking, not elsewhere classified: Secondary | ICD-10-CM

## 2019-10-03 DIAGNOSIS — M25562 Pain in left knee: Secondary | ICD-10-CM | POA: Diagnosis not present

## 2019-10-03 DIAGNOSIS — M25662 Stiffness of left knee, not elsewhere classified: Secondary | ICD-10-CM

## 2019-10-03 DIAGNOSIS — M6281 Muscle weakness (generalized): Secondary | ICD-10-CM

## 2019-10-03 NOTE — Therapy (Signed)
[facility] [address], [place], [place], [address] Phone: [phone]   Fax:  [phone]  Physical Therapy Treatment  Patient Details  Name: [name] [name] MRN: [id number] Date of Birth: 12/04/1955 Referring Provider (PT): Dr. Paralee Cancel   Encounter Date: 10/03/2019   PT End of Session - 10/03/19 1617    Visit Number 16    Date for PT Re-Evaluation 11/11/19    Authorization Type medicare    Progress Note Due on Visit 20    PT Start Time 9678    PT Stop Time 9381    PT Time Calculation (min) 61 min    Activity Tolerance Patient tolerated treatment well;No increased pain    Behavior During Therapy WFL for tasks assessed/performed           Past Medical History:  Diagnosis Date  . Acute meniscal tear of knee    Left knee  . Alcohol problem drinking    rehab  . Anemia    menstrual related  . Anxiety   . Arthritis    right ankle-neuropathy  . Colon polyps   . Depression   . DM (diabetes mellitus), type 2 ([place]) 12/2009   type 2  . Evalluate for OSA (obstructive sleep apnea) 08/15/2013   No OSA on sleep study but may be underestimated.-never used cpap    . GERD (gastroesophageal reflux disease)    not currently taking.  Marland Kitchen Hyperlipidemia   . Hypertension   . Hypothyroidism   . Liver disease   . Neuromuscular disorder ([place])    neropathy bilateral feet.  . Thyroid disease     Past Surgical History:  Procedure Laterality Date  . ADENOIDECTOMY    . BREAST SURGERY Right    lumpectomy-benign  . [place]   1 time  . CHOLECYSTECTOMY    . INNER EAR SURGERY     left ear had tubes put in and removed, scar tissue built up/ loss of hearing in left ear for over a year  . MASS EXCISION Left 12/16/2013   Procedure: EXCISION LEFT  DISTAL THIGH MASS;  Surgeon: Mauri Pole, MD;  [facility];  Service: Orthopedics;  Laterality: Left;  . right ankle surgery      x 2- no retained hardware   . right rotator cuff surgery      left side  . TONSILLECTOMY    . TOTAL KNEE ARTHROPLASTY  2009   right  . TOTAL KNEE ARTHROPLASTY Left 05/05/2015   Procedure: LEFT TOTAL KNEE ARTHROPLASTY;  Surgeon: Paralee Cancel, MD;  [facility];  Service: Orthopedics;  Laterality: Left;  . TOTAL KNEE REVISION Right 12/16/2013   Procedure: RIGHT TOTAL KNEE REVISION POLY EXCHANGE;  Surgeon: Mauri Pole, MD;  [facility];  Service: Orthopedics;  Laterality: Right;  . TOTAL KNEE REVISION Left 08/15/2019   Procedure: LEFT KNEE REVISION;  Surgeon: Paralee Cancel, MD;  [facility];  Service: Orthopedics;  Laterality: Left;  2 hrs  . TUBAL LIGATION  1987    There were no vitals filed for this visit.   Subjective Assessment - 10/03/19 1535    Subjective Pt states she got a good report with the surgeon. She feels her flexibility is improving.    Currently in Pain? No/denies    Pain Onset More than a month ago  [place] Adult PT Treatment/Exercise - 10/03/19 0001      Knee/Hip Exercises: Machines for Strengthening   Cybex Knee Flexion Lt only: 20# x10 reps, #15 x10 reps     Total Gym Leg Press Seat 7: Lt only 3x10 reps       Knee/Hip Exercises: Standing   Hip Abduction Right;AROM;2 sets;10 reps    Abduction Limitations standing on foam pad     Hip Extension AROM;Right;2 sets;10 reps    Extension Limitations standing on foam pad     Forward Step Up Left;2 sets;Hand Hold: 1;Step Height: 4";10 reps   foam pad      Knee/Hip Exercises: Supine   Other Supine Knee/Hip Exercises single leg clam 2x10 reps Lt with yellow loop band      Vasopneumatic   Number Minutes Vasopneumatic  15 minutes    Vasopnuematic Location  Knee    Vasopneumatic Pressure Medium    Vasopneumatic Temperature  3 snowflakes      Manual Therapy   Manual therapy comments scar mobilization                     PT Short Term Goals - 09/26/19 1620      PT SHORT  TERM GOAL #1   Title be independent in initial HEP    Time 4    Period Weeks    Status Achieved    Target Date 09/16/19      PT SHORT TERM GOAL #2   Title demonstrates 90 deg AROM knee flexion and 5 or less deg knee ext for improved gait    Baseline ext: -5, flexion: 110    Time 4    Period Weeks    Status Achieved    Target Date 09/16/19      PT SHORT TERM GOAL #3   Title Be able to ambulate step through gait with LRAD    Baseline ambulating with SPC    Time 4    Period Weeks    Status Achieved    Target Date 09/16/19             PT Long Term Goals - 09/30/19 1549      PT LONG TERM GOAL #1   Title be independent in advanced HEP    Time 12    Period Weeks    Status On-going      PT LONG TERM GOAL #4   Title able to ambulate in community with 1/10 pain at most due to improved knee strength    Baseline 2-3/10    Time 12    Period Weeks    Status On-going                 Plan - 10/03/19 1615    Clinical Impression Statement Pt is making good progress towards her goals. Her FOTO score has improved from 96% to 36%. She has improved pain after having been started on the antibiotic from her surgeon. Session focused on therex to increase single leg strength and stability with step ups and uneven surfaces. Pt required 1 UE support with step ups on foam pad. PT completed more scar mobilization end of session. Minimal adhesions were noted. Will continue with current POC.    PT Frequency 2x / week    PT Duration 12 weeks    PT Treatment/Interventions ADLs/Self Care Home Management;Cryotherapy;Ultrasound;Traction;Moist Heat;Electrical Stimulation;Functional mobility training;Neuromuscular re-education;Therapeutic activities;Therapeutic exercise;Patient/family education;Manual techniques;Taping;Dry needling;Spinal Manipulations;Joint Manipulations    PT Next Visit Plan progress Lt  knee strength, functional mobility, and edema management    PT Home Exercise Plan [id number]      Consulted and Agree with Plan of Care Patient           Patient will benefit from skilled therapeutic intervention in order to improve the following deficits and impairments:  Decreased activity tolerance, Decreased strength, Postural dysfunction, Improper body mechanics, Impaired flexibility, Pain, Increased muscle spasms, Decreased range of motion, Abnormal gait, Difficulty walking, Increased edema  Visit Diagnosis: Left knee pain, unspecified chronicity  Stiffness of left knee, not elsewhere classified  Muscle weakness (generalized)  Difficulty in walking, not elsewhere classified     Problem List Patient Active Problem List   Diagnosis Date Noted  . S/P revision of total knee, left 08/15/2019  . Preop cardiovascular exam 08/13/2019  . Osteoarthritis of ankle, right 01/29/2018  . Disorder of tendon of posterior tibial muscle 07/06/2017  . Sleep related headaches 09/07/2016  . Nightmares REM-sleep type 09/07/2016  . RLS (restless legs syndrome) 09/07/2016  . Snoring 09/07/2016  . Excessive daytime sleepiness 09/07/2016  . Migraine 07/28/2016  . Solitary pulmonary nodule 06/08/2016  . S/P knee replacement 05/05/2015  . Former smoker 07/10/2014  . Shortness of breath 06/07/2014  . Chest pain 06/07/2014  . Osteoarthritis 05/16/2014  . Alcoholic fatty liver 22/29/7989  . Chronic post-traumatic stress disorder (PTSD) 05/02/2014  . Sleep disorder 05/02/2014  . Family history of alcoholism 05/02/2014  . Macrocytosis 04/19/2014  . Fatigue 08/15/2013  . Thyroid nodule 09/17/2012  . Hypothyroid 07/21/2010  . Type II diabetes mellitus, well controlled ([place]) 06/23/2010  . Hypertension associated with diabetes ([place]) 06/23/2010  . Alcohol use disorder, severe, in sustained remission ([place]) 06/23/2010  . Hyperlipidemia associated with type 2 diabetes mellitus ([place]) 06/23/2010  . Morbid obesity ([place]) 06/23/2010  . Chronic depression 06/23/2010  . History of colonic polyps  04/12/2010    5:08 PM,10/03/19 Sherol Dade PT, DPT [facility] [address] [place], [place], [address] Phone: [phone]   Fax:  [phone]  Name: [name] [name] MRN: [id number] Date of Birth: [birth year]

## 2019-10-08 ENCOUNTER — Other Ambulatory Visit: Payer: Self-pay

## 2019-10-08 ENCOUNTER — Ambulatory Visit: Payer: Medicare Other | Admitting: Physical Therapy

## 2019-10-08 ENCOUNTER — Encounter: Payer: Self-pay | Admitting: Physical Therapy

## 2019-10-08 DIAGNOSIS — M25662 Stiffness of left knee, not elsewhere classified: Secondary | ICD-10-CM

## 2019-10-08 DIAGNOSIS — M6281 Muscle weakness (generalized): Secondary | ICD-10-CM

## 2019-10-08 DIAGNOSIS — M25562 Pain in left knee: Secondary | ICD-10-CM

## 2019-10-08 NOTE — Therapy (Signed)
[facility] [address], [place], [place], [address] Phone: [phone]   Fax:  [phone]  Physical Therapy Treatment  Patient Details  Name: [name] [name] MRN: [id number] Date of Birth: 03/11/1955 Referring Provider (PT): Dr. Paralee Cancel   Encounter Date: 10/08/2019   PT End of Session - 10/08/19 1315    Visit Number 17    Date for PT Re-Evaluation 11/11/19    Authorization Type medicare    Progress Note Due on Visit 20    PT Start Time 1228    PT Stop Time 1318    PT Time Calculation (min) 50 min    Activity Tolerance Patient tolerated treatment well;No increased pain    Behavior During Therapy WFL for tasks assessed/performed           Past Medical History:  Diagnosis Date  . Acute meniscal tear of knee    Left knee  . Alcohol problem drinking    rehab  . Anemia    menstrual related  . Anxiety   . Arthritis    right ankle-neuropathy  . Colon polyps   . Depression   . DM (diabetes mellitus), type 2 ([place]) 12/2009   type 2  . Evalluate for OSA (obstructive sleep apnea) 08/15/2013   No OSA on sleep study but may be underestimated.-never used cpap    . GERD (gastroesophageal reflux disease)    not currently taking.  Marland Kitchen Hyperlipidemia   . Hypertension   . Hypothyroidism   . Liver disease   . Neuromuscular disorder ([place])    neropathy bilateral feet.  . Thyroid disease     Past Surgical History:  Procedure Laterality Date  . ADENOIDECTOMY    . BREAST SURGERY Right    lumpectomy-benign  . [place]   1 time  . CHOLECYSTECTOMY    . INNER EAR SURGERY     left ear had tubes put in and removed, scar tissue built up/ loss of hearing in left ear for over a year  . MASS EXCISION Left 12/16/2013   Procedure: EXCISION LEFT  DISTAL THIGH MASS;  Surgeon: Mauri Pole, MD;  [facility];  Service: Orthopedics;  Laterality: Left;  . right ankle surgery      x 2- no retained hardware   . right rotator cuff surgery      left side  . TONSILLECTOMY    . TOTAL KNEE ARTHROPLASTY  2009   right  . TOTAL KNEE ARTHROPLASTY Left 05/05/2015   Procedure: LEFT TOTAL KNEE ARTHROPLASTY;  Surgeon: Paralee Cancel, MD;  [facility];  Service: Orthopedics;  Laterality: Left;  . TOTAL KNEE REVISION Right 12/16/2013   Procedure: RIGHT TOTAL KNEE REVISION POLY EXCHANGE;  Surgeon: Mauri Pole, MD;  [facility];  Service: Orthopedics;  Laterality: Right;  . TOTAL KNEE REVISION Left 08/15/2019   Procedure: LEFT KNEE REVISION;  Surgeon: Paralee Cancel, MD;  [facility];  Service: Orthopedics;  Laterality: Left;  2 hrs  . TUBAL LIGATION  1987    There were no vitals filed for this visit.   Subjective Assessment - 10/08/19 1348    Subjective Pt states things are going well. No complaints at this time.    Currently in Pain? No/denies    Pain Onset More than a month ago  [place] Adult PT Treatment/Exercise - 10/08/19 0001      Knee/Hip Exercises: Machines for Strengthening   Cybex Knee Flexion Lt only: #20 x10 reps, #15 x10 reps      Knee/Hip Exercises: Standing   Heel Raises Both;1 set;20 reps    Heel Raises Limitations weight shifted Lt    Hip Abduction Stengthening;Right;Left;2 sets;10 reps;Knee straight    Abduction Limitations yellow TB around feet     Forward Step Up Left;2 sets;10 reps;Hand Hold: 1;Step Height: 8"      Knee/Hip Exercises: Seated   Sit to Sand 2 sets;10 reps;without UE support      Vasopneumatic   Number Minutes Vasopneumatic  10 minutes    Vasopnuematic Location  Knee    Vasopneumatic Pressure Medium    Vasopneumatic Temperature  3 snowflakes      Manual Therapy   Manual therapy comments scar mobilization                     PT Short Term Goals - 09/26/19 1620      PT SHORT TERM GOAL #1   Title be independent in initial HEP    Time 4    Period Weeks    Status Achieved    Target  Date 09/16/19      PT SHORT TERM GOAL #2   Title demonstrates 90 deg AROM knee flexion and 5 or less deg knee ext for improved gait    Baseline ext: -5, flexion: 110    Time 4    Period Weeks    Status Achieved    Target Date 09/16/19      PT SHORT TERM GOAL #3   Title Be able to ambulate step through gait with LRAD    Baseline ambulating with SPC    Time 4    Period Weeks    Status Achieved    Target Date 09/16/19             PT Long Term Goals - 09/30/19 1549      PT LONG TERM GOAL #1   Title be independent in advanced HEP    Time 12    Period Weeks    Status On-going      PT LONG TERM GOAL #4   Title able to ambulate in community with 1/10 pain at most due to improved knee strength    Baseline 2-3/10    Time 12    Period Weeks    Status On-going                 Plan - 10/08/19 1315    Clinical Impression Statement Pt continues to do well with progressions in her LE strengthening and proprioceptive training. Pt was able to complete step ups onto 8-inch box with 1 UE support. She had difficulty with single leg stance on unstable surface, noting hip hike compensations with this. PT made several updates to her HEP and pt demonstrated understanding of this. Will continue with current POC.    PT Frequency 2x / week    PT Duration 12 weeks    PT Treatment/Interventions ADLs/Self Care Home Management;Cryotherapy;Ultrasound;Traction;Moist Heat;Electrical Stimulation;Functional mobility training;Neuromuscular re-education;Therapeutic activities;Therapeutic exercise;Patient/family education;Manual techniques;Taping;Dry needling;Spinal Manipulations;Joint Manipulations    PT Next Visit Plan progress Lt knee strength, functional mobility, and edema management    PT Home Exercise Plan [id number]    Consulted and Agree with Plan of Care Patient           Patient will benefit  from skilled therapeutic intervention in order to improve the following deficits and impairments:   Decreased activity tolerance, Decreased strength, Postural dysfunction, Improper body mechanics, Impaired flexibility, Pain, Increased muscle spasms, Decreased range of motion, Abnormal gait, Difficulty walking, Increased edema  Visit Diagnosis: Left knee pain, unspecified chronicity  Stiffness of left knee, not elsewhere classified  Muscle weakness (generalized)     Problem List Patient Active Problem List   Diagnosis Date Noted  . S/P revision of total knee, left 08/15/2019  . Preop cardiovascular exam 08/13/2019  . Osteoarthritis of ankle, right 01/29/2018  . Disorder of tendon of posterior tibial muscle 07/06/2017  . Sleep related headaches 09/07/2016  . Nightmares REM-sleep type 09/07/2016  . RLS (restless legs syndrome) 09/07/2016  . Snoring 09/07/2016  . Excessive daytime sleepiness 09/07/2016  . Migraine 07/28/2016  . Solitary pulmonary nodule 06/08/2016  . S/P knee replacement 05/05/2015  . Former smoker 07/10/2014  . Shortness of breath 06/07/2014  . Chest pain 06/07/2014  . Osteoarthritis 05/16/2014  . Alcoholic fatty liver 03/70/4888  . Chronic post-traumatic stress disorder (PTSD) 05/02/2014  . Sleep disorder 05/02/2014  . Family history of alcoholism 05/02/2014  . Macrocytosis 04/19/2014  . Fatigue 08/15/2013  . Thyroid nodule 09/17/2012  . Hypothyroid 07/21/2010  . Type II diabetes mellitus, well controlled ([place]) 06/23/2010  . Hypertension associated with diabetes ([place]) 06/23/2010  . Alcohol use disorder, severe, in sustained remission ([place]) 06/23/2010  . Hyperlipidemia associated with type 2 diabetes mellitus ([place]) 06/23/2010  . Morbid obesity ([place]) 06/23/2010  . Chronic depression 06/23/2010  . History of colonic polyps 04/12/2010    1:49 PM,10/08/19 Sherol Dade PT, DPT [facility]  [facility] [address], [place], [place],  [address] Phone: [phone]   Fax:  [phone]  Name: [name] [name] MRN: [id number] Date of Birth: 1955-06-12

## 2019-10-10 ENCOUNTER — Ambulatory Visit: Payer: Medicare Other | Admitting: Physical Therapy

## 2019-10-10 ENCOUNTER — Other Ambulatory Visit: Payer: Self-pay

## 2019-10-10 ENCOUNTER — Encounter: Payer: Self-pay | Admitting: Physical Therapy

## 2019-10-10 DIAGNOSIS — M25562 Pain in left knee: Secondary | ICD-10-CM | POA: Diagnosis not present

## 2019-10-10 DIAGNOSIS — M25662 Stiffness of left knee, not elsewhere classified: Secondary | ICD-10-CM

## 2019-10-10 DIAGNOSIS — R262 Difficulty in walking, not elsewhere classified: Secondary | ICD-10-CM

## 2019-10-10 DIAGNOSIS — M6281 Muscle weakness (generalized): Secondary | ICD-10-CM

## 2019-10-10 NOTE — Therapy (Signed)
[facility] [address], [place], [place], [address] Phone: [phone]   Fax:  [phone]  Physical Therapy Treatment  Patient Details  Name: [name] [name] MRN: [id number] Date of Birth: [birth year] Referring Provider (PT): Dr. Paralee Cancel   Encounter Date: 10/10/2019   PT End of Session - 10/10/19 1614    Visit Number 18    Date for PT Re-Evaluation 11/11/19    Authorization Type medicare    Progress Note Due on Visit 20    PT Start Time 1532    PT Stop Time 1623    PT Time Calculation (min) 51 min    Activity Tolerance Patient tolerated treatment well;No increased pain    Behavior During Therapy WFL for tasks assessed/performed           Past Medical History:  Diagnosis Date  . Acute meniscal tear of knee    Left knee  . Alcohol problem drinking    rehab  . Anemia    menstrual related  . Anxiety   . Arthritis    right ankle-neuropathy  . Colon polyps   . Depression   . DM (diabetes mellitus), type 2 ([place]) 12/2009   type 2  . Evalluate for OSA (obstructive sleep apnea) 08/15/2013   No OSA on sleep study but may be underestimated.-never used cpap    . GERD (gastroesophageal reflux disease)    not currently taking.  Marland Kitchen Hyperlipidemia   . Hypertension   . Hypothyroidism   . Liver disease   . Neuromuscular disorder ([place])    neropathy bilateral feet.  . Thyroid disease     Past Surgical History:  Procedure Laterality Date  . ADENOIDECTOMY    . BREAST SURGERY Right    lumpectomy-benign  . [place]   1 time  . CHOLECYSTECTOMY    . INNER EAR SURGERY     left ear had tubes put in and removed, scar tissue built up/ loss of hearing in left ear for over a year  . MASS EXCISION Left 12/16/2013   Procedure: EXCISION LEFT  DISTAL THIGH MASS;  Surgeon: Mauri Pole, MD;  Location: [facility];  Service: Orthopedics;  Laterality: Left;  . right ankle surgery      x 2- no retained hardware   . right rotator cuff surgery      left side  . TONSILLECTOMY    . TOTAL KNEE ARTHROPLASTY  2009   right  . TOTAL KNEE ARTHROPLASTY Left 05/05/2015   Procedure: LEFT TOTAL KNEE ARTHROPLASTY;  Surgeon: Paralee Cancel, MD;  Location: [facility];  Service: Orthopedics;  Laterality: Left;  . TOTAL KNEE REVISION Right 12/16/2013   Procedure: RIGHT TOTAL KNEE REVISION POLY EXCHANGE;  Surgeon: Mauri Pole, MD;  Location: [facility];  Service: Orthopedics;  Laterality: Right;  . TOTAL KNEE REVISION Left 08/15/2019   Procedure: LEFT KNEE REVISION;  Surgeon: Paralee Cancel, MD;  Location: [facility];  Service: Orthopedics;  Laterality: Left;  2 hrs  . TUBAL LIGATION  1987    There were no vitals filed for this visit.   Subjective Assessment - 10/10/19 1534    Subjective Pt states that things are going well. No pain currently. Felt good after her last session.    Currently in Pain? No/denies    Pain Onset More than a month ago  [place] Adult PT Treatment/Exercise - 10/10/19 0001      Knee/Hip Exercises: Stretches   Other Knee/Hip Stretches Lt hip adductor stretch 3x20 sec      Knee/Hip Exercises: Machines for Strengthening   Cybex Knee Flexion Lt only: #15 x20 reps, #20x1 reps     Total Gym Leg Press Lt only: #100 3x10 reps       Knee/Hip Exercises: Standing   Hip ADduction Strengthening;Left;2 sets;10 reps    Hip ADduction Limitations yellow TB    Forward Step Up Left;2 sets;10 reps;Hand Hold: 1;Step Height: 6"    Forward Step Up Limitations Rt hip flexion to encourage glute activation on Lt       Vasopneumatic   Number Minutes Vasopneumatic  10 minutes    Vasopnuematic Location  Knee    Vasopneumatic Pressure Medium    Vasopneumatic Temperature  3 snowflakes      Manual Therapy   Manual therapy comments scar mobilization                   PT Education - 10/10/19 1615    Education Details technique with therex    Person(s) Educated  Patient    Methods Explanation;Verbal cues;Tactile cues    Comprehension Verbalized understanding;Returned demonstration            PT Short Term Goals - 09/26/19 1620      PT SHORT TERM GOAL #1   Title be independent in initial HEP    Time 4    Period Weeks    Status Achieved    Target Date 09/16/19      PT SHORT TERM GOAL #2   Title demonstrates 90 deg AROM knee flexion and 5 or less deg knee ext for improved gait    Baseline ext: -5, flexion: 110    Time 4    Period Weeks    Status Achieved    Target Date 09/16/19      PT SHORT TERM GOAL #3   Title Be able to ambulate step through gait with LRAD    Baseline ambulating with SPC    Time 4    Period Weeks    Status Achieved    Target Date 09/16/19             PT Long Term Goals - 09/30/19 1549      PT LONG TERM GOAL #1   Title be independent in advanced HEP    Time 12    Period Weeks    Status On-going      PT LONG TERM GOAL #4   Title able to ambulate in community with 1/10 pain at most due to improved knee strength    Baseline 2-3/10    Time 12    Period Weeks    Status On-going                 Plan - 10/10/19 1634    Clinical Impression Statement Today's session continued with focus on increasing LE strength and endurance. Pt was able to complete progressions in reps and sets with all LE exercises. Pt complains of intermittent medial knee pain when adducting. She has weakness of the hip adductors, so exercises to address this were included during today's session. Pt's scar mobility is within normal limits. Will continue with current POC.    PT Frequency 2x / week    PT Duration 12 weeks    PT Treatment/Interventions ADLs/Self Care Home Management;Cryotherapy;Ultrasound;Traction;Moist Heat;Electrical Stimulation;Functional mobility training;Neuromuscular re-education;Therapeutic activities;Therapeutic  exercise;Patient/family education;Manual techniques;Taping;Dry needling;Spinal Manipulations;Joint  Manipulations    PT Next Visit Plan progress Lt knee strength, functional mobility, and edema management    PT Home Exercise Plan [id number]    Consulted and Agree with Plan of Care Patient           Patient will benefit from skilled therapeutic intervention in order to improve the following deficits and impairments:  Decreased activity tolerance, Decreased strength, Postural dysfunction, Improper body mechanics, Impaired flexibility, Pain, Increased muscle spasms, Decreased range of motion, Abnormal gait, Difficulty walking, Increased edema  Visit Diagnosis: Left knee pain, unspecified chronicity  Stiffness of left knee, not elsewhere classified  Muscle weakness (generalized)  Difficulty in walking, not elsewhere classified     Problem List Patient Active Problem List   Diagnosis Date Noted  . S/P revision of total knee, left 08/15/2019  . Preop cardiovascular exam 08/13/2019  . Osteoarthritis of ankle, right 01/29/2018  . Disorder of tendon of posterior tibial muscle 07/06/2017  . Sleep related headaches 09/07/2016  . Nightmares REM-sleep type 09/07/2016  . RLS (restless legs syndrome) 09/07/2016  . Snoring 09/07/2016  . Excessive daytime sleepiness 09/07/2016  . Migraine 07/28/2016  . Solitary pulmonary nodule 06/08/2016  . S/P knee replacement 05/05/2015  . Former smoker 07/10/2014  . Shortness of breath 06/07/2014  . Chest pain 06/07/2014  . Osteoarthritis 05/16/2014  . Alcoholic fatty liver 46/56/8127  . Chronic post-traumatic stress disorder (PTSD) 05/02/2014  . Sleep disorder 05/02/2014  . Family history of alcoholism 05/02/2014  . Macrocytosis 04/19/2014  . Fatigue 08/15/2013  . Thyroid nodule 09/17/2012  . Hypothyroid 07/21/2010  . Type II diabetes mellitus, well controlled ([place]) 06/23/2010  . Hypertension associated with diabetes ([place]) 06/23/2010  . Alcohol use disorder, severe, in sustained remission ([place]) 06/23/2010  . Hyperlipidemia associated with  type 2 diabetes mellitus ([place]) 06/23/2010  . Morbid obesity ([place]) 06/23/2010  . Chronic depression 06/23/2010  . History of colonic polyps 04/12/2010    4:37 PM,10/10/19 Sherol Dade PT, DPT [facility] [address], [place], [place], [address] Phone: [phone]   Fax:  [phone]  Name: [name] [name] MRN: [id number] Date of Birth: [birth year]

## 2019-10-15 ENCOUNTER — Encounter: Payer: Self-pay | Admitting: Physical Therapy

## 2019-10-15 ENCOUNTER — Ambulatory Visit: Payer: Medicare Other | Admitting: Physical Therapy

## 2019-10-15 ENCOUNTER — Other Ambulatory Visit: Payer: Self-pay

## 2019-10-15 DIAGNOSIS — M6281 Muscle weakness (generalized): Secondary | ICD-10-CM

## 2019-10-15 DIAGNOSIS — M25662 Stiffness of left knee, not elsewhere classified: Secondary | ICD-10-CM

## 2019-10-15 DIAGNOSIS — R262 Difficulty in walking, not elsewhere classified: Secondary | ICD-10-CM

## 2019-10-15 DIAGNOSIS — M25562 Pain in left knee: Secondary | ICD-10-CM

## 2019-10-15 NOTE — Therapy (Signed)
[facility] [address] [place], [place], [address] Phone: [phone]   Fax:  [phone]  Physical Therapy Treatment  Patient Details  Name: [name] [name] MRN: [id number] Date of Birth: Aug 23, 1955 Referring Provider (PT): Dr. Paralee Cancel   Encounter Date: 10/15/2019   PT End of Session - 10/15/19 1452    Visit Number 19    Date for PT Re-Evaluation 11/11/19    Authorization Type medicare    Authorization - Visit Number 18    Authorization - Number of Visits 30    Progress Note Due on Visit 20    PT Start Time 7425    PT Stop Time 1540    PT Time Calculation (min) 53 min    Activity Tolerance Patient tolerated treatment well;No increased pain    Behavior During Therapy WFL for tasks assessed/performed           Past Medical History:  Diagnosis Date  . Acute meniscal tear of knee    Left knee  . Alcohol problem drinking    rehab  . Anemia    menstrual related  . Anxiety   . Arthritis    right ankle-neuropathy  . Colon polyps   . Depression   . DM (diabetes mellitus), type 2 ([place]) 12/2009   type 2  . Evalluate for OSA (obstructive sleep apnea) 08/15/2013   No OSA on sleep study but may be underestimated.-never used cpap    . GERD (gastroesophageal reflux disease)    not currently taking.  Marland Kitchen Hyperlipidemia   . Hypertension   . Hypothyroidism   . Liver disease   . Neuromuscular disorder ([place])    neropathy bilateral feet.  . Thyroid disease     Past Surgical History:  Procedure Laterality Date  . ADENOIDECTOMY    . BREAST SURGERY Right    lumpectomy-benign  . [place]   1 time  . CHOLECYSTECTOMY    . INNER EAR SURGERY     left ear had tubes put in and removed, scar tissue built up/ loss of hearing in left ear for over a year  . MASS EXCISION Left 12/16/2013   Procedure: EXCISION LEFT  DISTAL THIGH MASS;  Surgeon: Mauri Pole, MD;  Location: [facility];  Service:  Orthopedics;  Laterality: Left;  . right ankle surgery      x 2- no retained hardware  . right rotator cuff surgery      left side  . TONSILLECTOMY    . TOTAL KNEE ARTHROPLASTY  2009   right  . TOTAL KNEE ARTHROPLASTY Left 05/05/2015   Procedure: LEFT TOTAL KNEE ARTHROPLASTY;  Surgeon: Paralee Cancel, MD;  Location: [facility];  Service: Orthopedics;  Laterality: Left;  . TOTAL KNEE REVISION Right 12/16/2013   Procedure: RIGHT TOTAL KNEE REVISION POLY EXCHANGE;  Surgeon: Mauri Pole, MD;  [facility]: [facility];  Service: Orthopedics;  Laterality: Right;  . TOTAL KNEE REVISION Left 08/15/2019   Procedure: LEFT KNEE REVISION;  Surgeon: Paralee Cancel, MD;  Location: [facility];  Service: Orthopedics;  Laterality: Left;  2 hrs  . TUBAL LIGATION  1987    There were no vitals filed for this visit.   Subjective Assessment - 10/15/19 1451    Subjective Knee is doing much better.  It is just sore in AM on waking.  Loosens up quickly once I start moving.    Pertinent History Lt TKA revision 08/15/19; chronic neck and back pain  Limitations Walking    Patient Stated Goals reduce pain, be able to walk with normal gait no AD    Currently in Pain? No/denies    Pain Score 0-No pain    Pain Location Knee    Pain Descriptors / Indicators Sore    Pain Onset More than a month ago                             [facility] Adult PT Treatment/Exercise - 10/15/19 0001      Exercises   Exercises Knee/Hip      Knee/Hip Exercises: Aerobic   Recumbent Bike L2 x 6', PT present to discuss progress and symptoms      Knee/Hip Exercises: Machines for Strengthening   Cybex Knee Flexion Lt only: #15 x20 reps, #20x2 reps     Total Gym Leg Press Lt only: #100 3x10 reps       Knee/Hip Exercises: Standing   Hip ADduction Strengthening;Left;10 reps;1 set    Hip ADduction Limitations yellow TB    Forward Step Up Left;2 sets;10 reps;Hand Hold: 1;Step Height: 6"    Forward Step Up Limitations Rt hip flexion to  3rd step to encourage glute activation on Lt     Rebounder marching x 2'    Other Standing Knee Exercises Lt LE SLS with rapid small motions of Rt LE back/forth/out/in x 30 sec each      Knee/Hip Exercises: Supine   Bridges Strengthening;10 reps    Bridges with [employer] Strengthening;1 set;5 reps   Pt got hamstring cramp, d/c'd     Knee/Hip Exercises: Sidelying   Hip ADduction Strengthening;Left;1 set;10 reps      Vasopneumatic   Number Minutes Vasopneumatic  15 minutes    Vasopnuematic Location  Knee    Vasopneumatic Pressure Medium    Vasopneumatic Temperature  3 snowflakes      Manual Therapy   Manual therapy comments scar mobilization lower 1/3    Joint Mobilization Lt patellar lateral/medial mobs                    PT Short Term Goals - 09/26/19 1620      PT SHORT TERM GOAL #1   Title be independent in initial HEP    Time 4    Period Weeks    Status Achieved    Target Date 09/16/19      PT SHORT TERM GOAL #2   Title demonstrates 90 deg AROM knee flexion and 5 or less deg knee ext for improved gait    Baseline ext: -5, flexion: 110    Time 4    Period Weeks    Status Achieved    Target Date 09/16/19      PT SHORT TERM GOAL #3   Title Be able to ambulate step through gait with LRAD    Baseline ambulating with [facility]    Time 4    Period Weeks    Status Achieved    Target Date 09/16/19             PT Long Term Goals - 09/30/19 1549      PT LONG TERM GOAL #1   Title be independent in advanced HEP    Time 12    Period Weeks    Status On-going      PT LONG TERM GOAL #4   Title able to ambulate in community with 1/10 pain at most due to  improved knee strength    Baseline 2-3/10    Time 12    Period Weeks    Status On-going                 Plan - 10/15/19 1512    Clinical Impression Statement Pt with excellent pain control and improving strength and functional tolerance of daily activities.  Lt adductor strength is improved with Pt  able to demo 2x10 sidelying adduction today without difficulty.  She has some dynamic balance deficits which PT challenged on rebounder and in Lt SLS today.  Pt tolerating increased reps and resistance.  Scar mobility is improving.  PT worked on lower third of scar today.  Continue along POC to progress functional strength and dynamic balance.    Comorbidities bil knee replacements, ankle ORIF, chronic LBP    Rehab Potential Excellent    PT Frequency 2x / week    PT Duration 12 weeks    PT Treatment/Interventions ADLs/Self Care Home Management;Cryotherapy;Ultrasound;Traction;Moist Heat;Electrical Stimulation;Functional mobility training;Neuromuscular re-education;Therapeutic activities;Therapeutic exercise;Patient/family education;Manual techniques;Taping;Dry needling;Spinal Manipulations;Joint Manipulations    PT Next Visit Plan progress Lt knee strength, functional mobility, and edema management    PT Home Exercise Plan [id number]    Consulted and Agree with Plan of Care Patient           Patient will benefit from skilled therapeutic intervention in order to improve the following deficits and impairments:     Visit Diagnosis: Left knee pain, unspecified chronicity  Stiffness of left knee, not elsewhere classified  Muscle weakness (generalized)  Difficulty in walking, not elsewhere classified     Problem List Patient Active Problem List   Diagnosis Date Noted  . S/P revision of total knee, left 08/15/2019  . Preop cardiovascular exam 08/13/2019  . Osteoarthritis of ankle, right 01/29/2018  . Disorder of tendon of posterior tibial muscle 07/06/2017  . Sleep related headaches 09/07/2016  . Nightmares REM-sleep type 09/07/2016  . RLS (restless legs syndrome) 09/07/2016  . Snoring 09/07/2016  . Excessive daytime sleepiness 09/07/2016  . Migraine 07/28/2016  . Solitary pulmonary nodule 06/08/2016  . S/P knee replacement 05/05/2015  . Former smoker 07/10/2014  . Shortness of  breath 06/07/2014  . Chest pain 06/07/2014  . Osteoarthritis 05/16/2014  . Alcoholic fatty liver 28/76/8115  . Chronic post-traumatic stress disorder (PTSD) 05/02/2014  . Sleep disorder 05/02/2014  . Family history of alcoholism 05/02/2014  . Macrocytosis 04/19/2014  . Fatigue 08/15/2013  . Thyroid nodule 09/17/2012  . Hypothyroid 07/21/2010  . Type II diabetes mellitus, well controlled ([place]) 06/23/2010  . Hypertension associated with diabetes ([place]) 06/23/2010  . Alcohol use disorder, severe, in sustained remission ([place]) 06/23/2010  . Hyperlipidemia associated with type 2 diabetes mellitus ([place]) 06/23/2010  . Morbid obesity ([place]) 06/23/2010  . Chronic depression 06/23/2010  . History of colonic polyps 04/12/2010    Baruch Merl, PT 10/15/19 3:29 PM   [facility] [address] [place], [place], [address] Phone: [phone]   Fax:  [phone]  Name: Ania Levay MRN: [id number] Date of Birth: 1955/03/31

## 2019-10-17 ENCOUNTER — Other Ambulatory Visit: Payer: Self-pay

## 2019-10-17 ENCOUNTER — Encounter: Payer: Self-pay | Admitting: Physical Therapy

## 2019-10-17 ENCOUNTER — Ambulatory Visit: Payer: Medicare Other | Admitting: Physical Therapy

## 2019-10-17 DIAGNOSIS — M25562 Pain in left knee: Secondary | ICD-10-CM | POA: Diagnosis not present

## 2019-10-17 DIAGNOSIS — R262 Difficulty in walking, not elsewhere classified: Secondary | ICD-10-CM

## 2019-10-17 DIAGNOSIS — M6281 Muscle weakness (generalized): Secondary | ICD-10-CM

## 2019-10-17 DIAGNOSIS — M25662 Stiffness of left knee, not elsewhere classified: Secondary | ICD-10-CM

## 2019-10-17 NOTE — Therapy (Signed)
[facility] [address] [place], [place], [address] Phone: [phone]   Fax:  [phone]  Physical Therapy Treatment  Patient Details  Name: [name] [name] MRN: [id number] Date of Birth: Sep 27, 1955 Referring Provider (PT): Dr. Paralee Cancel  Progress Note Reporting Period 09/16/19 to 10/17/19  See note below for Objective Data and Assessment of Progress/Goals.      Encounter Date: 10/17/2019   PT End of Session - 10/17/19 1106    Visit Number 20    Date for PT Re-Evaluation 11/11/19    Authorization Type medicare    Authorization - Visit Number 20    Authorization - Number of Visits 30    Progress Note Due on Visit 20    PT Start Time 1104    PT Stop Time 1157    PT Time Calculation (min) 53 min    Activity Tolerance Patient tolerated treatment well;No increased pain    Behavior During Therapy WFL for tasks assessed/performed           Past Medical History:  Diagnosis Date  . Acute meniscal tear of knee    Left knee  . Alcohol problem drinking    rehab  . Anemia    menstrual related  . Anxiety   . Arthritis    right ankle-neuropathy  . Colon polyps   . Depression   . DM (diabetes mellitus), type 2 ([place]) 12/2009   type 2  . Evalluate for OSA (obstructive sleep apnea) 08/15/2013   No OSA on sleep study but may be underestimated.-never used cpap    . GERD (gastroesophageal reflux disease)    not currently taking.  Marland Kitchen Hyperlipidemia   . Hypertension   . Hypothyroidism   . Liver disease   . Neuromuscular disorder ([place])    neropathy bilateral feet.  . Thyroid disease     Past Surgical History:  Procedure Laterality Date  . ADENOIDECTOMY    . BREAST SURGERY Right    lumpectomy-benign  . [place]   1 time  . CHOLECYSTECTOMY    . INNER EAR SURGERY     left ear had tubes put in and removed, scar tissue built up/ loss of hearing in left ear for over a year  . MASS EXCISION Left  12/16/2013   Procedure: EXCISION LEFT  DISTAL THIGH MASS;  Surgeon: Mauri Pole, MD;  Location: [facility];  Service: Orthopedics;  Laterality: Left;  . right ankle surgery      x 2- no retained hardware  . right rotator cuff surgery      left side  . TONSILLECTOMY    . TOTAL KNEE ARTHROPLASTY  2009   right  . TOTAL KNEE ARTHROPLASTY Left 05/05/2015   Procedure: LEFT TOTAL KNEE ARTHROPLASTY;  Surgeon: Paralee Cancel, MD;  Location: [facility];  Service: Orthopedics;  Laterality: Left;  . TOTAL KNEE REVISION Right 12/16/2013   Procedure: RIGHT TOTAL KNEE REVISION POLY EXCHANGE;  Surgeon: Mauri Pole, MD;  Location: [facility];  Service: Orthopedics;  Laterality: Right;  . TOTAL KNEE REVISION Left 08/15/2019   Procedure: LEFT KNEE REVISION;  Surgeon: Paralee Cancel, MD;  Location: [facility];  Service: Orthopedics;  Laterality: Left;  2 hrs  . TUBAL LIGATION  1987    There were no vitals filed for this visit.   Subjective Assessment - 10/17/19 1105    Subjective Pt states that things are going well. No pain currently.    Pertinent History Lt  TKA revision 08/15/19; chronic neck and back pain    Limitations Walking    Patient Stated Goals reduce pain, be able to walk with normal gait no AD    Currently in Pain? No/denies    Pain Onset More than a month ago              [facility] PT Assessment - 10/17/19 0001      Assessment   Medical Diagnosis Lt Knee TKA revision    Referring Provider (PT) Dr. Paralee Cancel    Next MD Visit sometime in September       Precautions   Precautions None      Balance Screen   Has the patient fallen in the past 6 months No    Has the patient had a decrease in activity level because of a fear of falling?  No    Is the patient reluctant to leave their home because of a fear of falling?  No      Prior Function   Level of Independence Independent      Observation/Other Assessments   Skin Integrity surgical incision healing well.       Strength   Right Hip Flexion  5/5    Right Hip Extension 4/5    Right Hip ABduction 5/5    Left Hip Extension 4/5    Left Hip ABduction 4+/5      Transfers   Five time sit to stand comments  10 sec, without UE support       High Level Balance   High Level Balance Comments SLS: 5 sec each LE                          [facility] Adult PT Treatment/Exercise - 10/17/19 0001      Knee/Hip Exercises: Machines for Strengthening   Total Gym Leg Press seat 7-Lt only: #100 2x10 reps       Knee/Hip Exercises: Standing   Forward Step Up Left;2 sets;10 reps;Hand Hold: 1;Step Height: 6"    Forward Step Up Limitations holding #8 in Rt UE    Rebounder Lt LE with Rt reach forward/side x30 sec each 1 UE support; throwing blue ball tandem stance x10 reps each LE forward, standing Lt LE with Rt toe support x10 reps       Knee/Hip Exercises: Supine   Bridges with Ball Squeeze Strengthening;Right;Left;Both;2 sets;10 reps      Knee/Hip Exercises: Prone   Hamstring Curl 2 sets;10 reps    Hamstring Curl Limitations xlight green loop      Vasopneumatic   Number Minutes Vasopneumatic  15 minutes    Vasopnuematic Location  Knee    Vasopneumatic Pressure Medium    Vasopneumatic Temperature  3 snowflakes      Manual Therapy   Manual therapy comments scar mobilization lower 1/2                   PT Education - 10/17/19 1140    Education Details progress towards goals    Person(s) Educated Patient    Methods Explanation;Verbal cues    Comprehension Verbalized understanding;Returned demonstration            PT Short Term Goals - 10/17/19 1106      PT SHORT TERM GOAL #1   Title be independent in initial HEP    Time 4    Period Weeks    Status Achieved    Target Date 09/16/19  PT SHORT TERM GOAL #2   Title demonstrates 90 deg AROM knee flexion and 5 or less deg knee ext for improved gait    Baseline ext: -5, flexion: 110    Time 4    Period Weeks    Status Achieved    Target Date 09/16/19       PT SHORT TERM GOAL #3   Title Be able to ambulate step through gait with LRAD    Baseline ambulating with SPC    Time 4    Period Weeks    Status Achieved    Target Date 09/16/19             PT Long Term Goals - 10/17/19 1107      PT LONG TERM GOAL #1   Title be independent in advanced HEP    Time 12    Period Weeks    Status New      PT LONG TERM GOAL #2   Title FOTO < or = to 53% limited    Baseline 96%    Time 12    Period Weeks    Status New      PT LONG TERM GOAL #3   Title knee ROM 120-0 deg for improved gait and function    Baseline 110 deg flexion    Time 12    Period Weeks    Status On-going      PT LONG TERM GOAL #4   Title able to ambulate in community with 1/10 pain at most due to improved knee strength    Time 12    Period Weeks    Status Achieved      PT LONG TERM GOAL #5   Title Lt knee and hip strength 5/5 for ability to walk outside, stairs, and perform transfers without compensations    Time 12    Period Weeks    Status On-going      PT LONG TERM GOAL #6   Title .Marland KitchenMarland Kitchen                 Plan - 10/17/19 1141    Clinical Impression Statement Pt is making steady progress towards her goals. She is consistently progressing LE strength and proprioception exercises during her session with minimal difficulty. Pt's Lt hip abductor, adductor and hamstring strength is lacking compared to the Rt, so we will focus on this moving forward. Pt's single leg proprioception is improved but an area of concern, as she is unable to consistently maintain single leg balance without UE support. She would continue to benefit from skilled PT moving forward to address remaining limitations in scar mobility, Lt LE strength and proprioception to prevent risk of injury.    Comorbidities bil knee replacements, ankle ORIF, chronic LBP    Rehab Potential Excellent    PT Frequency 2x / week    PT Duration 12 weeks    PT Treatment/Interventions ADLs/Self Care Home  Management;Cryotherapy;Ultrasound;Traction;Moist Heat;Electrical Stimulation;Functional mobility training;Neuromuscular re-education;Therapeutic activities;Therapeutic exercise;Patient/family education;Manual techniques;Taping;Dry needling;Spinal Manipulations;Joint Manipulations    PT Next Visit Plan progress Lt knee strength, functional mobility, and edema management    PT Home Exercise Plan [id number]    Consulted and Agree with Plan of Care Patient           Patient will benefit from skilled therapeutic intervention in order to improve the following deficits and impairments:     Visit Diagnosis: Left knee pain, unspecified chronicity  Stiffness of left knee, not elsewhere classified  Difficulty  in walking, not elsewhere classified  Muscle weakness (generalized)     Problem List Patient Active Problem List   Diagnosis Date Noted  . S/P revision of total knee, left 08/15/2019  . Preop cardiovascular exam 08/13/2019  . Osteoarthritis of ankle, right 01/29/2018  . Disorder of tendon of posterior tibial muscle 07/06/2017  . Sleep related headaches 09/07/2016  . Nightmares REM-sleep type 09/07/2016  . RLS (restless legs syndrome) 09/07/2016  . Snoring 09/07/2016  . Excessive daytime sleepiness 09/07/2016  . Migraine 07/28/2016  . Solitary pulmonary nodule 06/08/2016  . S/P knee replacement 05/05/2015  . Former smoker 07/10/2014  . Shortness of breath 06/07/2014  . Chest pain 06/07/2014  . Osteoarthritis 05/16/2014  . Alcoholic fatty liver 17/49/4496  . Chronic post-traumatic stress disorder (PTSD) 05/02/2014  . Sleep disorder 05/02/2014  . Family history of alcoholism 05/02/2014  . Macrocytosis 04/19/2014  . Fatigue 08/15/2013  . Thyroid nodule 09/17/2012  . Hypothyroid 07/21/2010  . Type II diabetes mellitus, well controlled ([place]) 06/23/2010  . Hypertension associated with diabetes ([place]) 06/23/2010  . Alcohol use disorder, severe, in sustained remission ([place])  06/23/2010  . Hyperlipidemia associated with type 2 diabetes mellitus ([place]) 06/23/2010  . Morbid obesity ([place]) 06/23/2010  . Chronic depression 06/23/2010  . History of colonic polyps 04/12/2010    11:43 AM,10/17/19 Sherol Dade PT, DPT [facility] [address], [place], [place], [address] Phone: [phone]   Fax:  [phone]  Name: [name] [name] MRN: [id number] Date of Birth: [birth year]

## 2019-10-21 ENCOUNTER — Ambulatory Visit: Payer: Medicare Other

## 2019-10-21 ENCOUNTER — Other Ambulatory Visit: Payer: Self-pay

## 2019-10-21 DIAGNOSIS — M25562 Pain in left knee: Secondary | ICD-10-CM | POA: Diagnosis not present

## 2019-10-21 DIAGNOSIS — M6281 Muscle weakness (generalized): Secondary | ICD-10-CM

## 2019-10-21 DIAGNOSIS — R262 Difficulty in walking, not elsewhere classified: Secondary | ICD-10-CM

## 2019-10-21 DIAGNOSIS — M25662 Stiffness of left knee, not elsewhere classified: Secondary | ICD-10-CM

## 2019-10-21 NOTE — Therapy (Signed)
[facility] [address], [place], [place], [address] Phone: [phone]   Fax:  [phone]  Physical Therapy Treatment  Patient Details  Name: [name] [name] MRN: [id number] Date of Birth: 1955-03-15 Referring Provider (PT): Dr. Paralee Cancel   Encounter Date: 10/21/2019   PT End of Session - 10/21/19 1615    Visit Number 21    Date for PT Re-Evaluation 11/11/19    Authorization Type medicare    Authorization - Visit Number 21    Authorization - Number of Visits 30    Progress Note Due on Visit 30    PT Start Time 6568    PT Stop Time 1626    PT Time Calculation (min) 53 min    Activity Tolerance Patient tolerated treatment well;No increased pain    Behavior During Therapy WFL for tasks assessed/performed           Past Medical History:  Diagnosis Date  . Acute meniscal tear of knee    Left knee  . Alcohol problem drinking    rehab  . Anemia    menstrual related  . Anxiety   . Arthritis    right ankle-neuropathy  . Colon polyps   . Depression   . DM (diabetes mellitus), type 2 ([place]) 12/2009   type 2  . Evalluate for OSA (obstructive sleep apnea) 08/15/2013   No OSA on sleep study but may be underestimated.-never used cpap    . GERD (gastroesophageal reflux disease)    not currently taking.  Marland Kitchen Hyperlipidemia   . Hypertension   . Hypothyroidism   . Liver disease   . Neuromuscular disorder ([place])    neropathy bilateral feet.  . Thyroid disease     Past Surgical History:  Procedure Laterality Date  . ADENOIDECTOMY    . BREAST SURGERY Right    lumpectomy-benign  . [place]   1 time  . CHOLECYSTECTOMY    . INNER EAR SURGERY     left ear had tubes put in and removed, scar tissue built up/ loss of hearing in left ear for over a year  . MASS EXCISION Left 12/16/2013   Procedure: EXCISION LEFT  DISTAL THIGH MASS;  Surgeon: Mauri Pole, MD;  Location: [facility];  Service:  Orthopedics;  Laterality: Left;  . right ankle surgery      x 2- no retained hardware  . right rotator cuff surgery      left side  . TONSILLECTOMY    . TOTAL KNEE ARTHROPLASTY  2009   right  . TOTAL KNEE ARTHROPLASTY Left 05/05/2015   Procedure: LEFT TOTAL KNEE ARTHROPLASTY;  Surgeon: Paralee Cancel, MD;  [facility]: [facility];  Service: Orthopedics;  Laterality: Left;  . TOTAL KNEE REVISION Right 12/16/2013   Procedure: RIGHT TOTAL KNEE REVISION POLY EXCHANGE;  Surgeon: Mauri Pole, MD;  [facility]: [facility];  Service: Orthopedics;  Laterality: Right;  . TOTAL KNEE REVISION Left 08/15/2019   Procedure: LEFT KNEE REVISION;  Surgeon: Paralee Cancel, MD;  Location: [facility];  Service: Orthopedics;  Laterality: Left;  2 hrs  . TUBAL LIGATION  1987    There were no vitals filed for this visit.   Subjective Assessment - 10/21/19 1540    Subjective My knee is doing well.    Pertinent History Lt TKA revision 08/15/19; chronic neck and back pain    Currently in Pain? No/denies  [facility] PT Assessment - 10/21/19 0001      Observation/Other Assessments   Focus on Therapeutic Outcomes (FOTO)  36% limited (10/03/19)                         [place] [facility] Adult PT Treatment/Exercise - 10/21/19 0001      Knee/Hip Exercises: Machines for Strengthening   Total Gym Leg Press seat 7-Lt only: 100#  x30, bil legs x10      Knee/Hip Exercises: Standing   Forward Step Up Left;2 sets;10 reps;Hand Hold: 1;Step Height: 6"    Forward Step Up Limitations holding #8 in Rt UE    Walking with Sports Cord sidestepping to work on lateral hip stabilizers: 20# x 10 each side    Other Standing Knee Exercises Lt SLS with lateral slider  2x10      Knee/Hip Exercises: Seated   Hamstring Curl Left;Strengthening;2 sets;10 reps    Hamstring Limitations green loop    Sit to Sand 2 sets;10 reps;without UE support   5# kettle bell added     Vasopneumatic   Number Minutes Vasopneumatic  15 minutes     Vasopnuematic Location  Knee    Vasopneumatic Pressure Medium    Vasopneumatic Temperature  3 snowflakes                    PT Short Term Goals - 10/17/19 1106      PT SHORT TERM GOAL #1   Title be independent in initial HEP    Time 4    Period Weeks    Status Achieved    Target Date 09/16/19      PT SHORT TERM GOAL #2   Title demonstrates 90 deg AROM knee flexion and 5 or less deg knee ext for improved gait    Baseline ext: -5, flexion: 110    Time 4    Period Weeks    Status Achieved    Target Date 09/16/19      PT SHORT TERM GOAL #3   Title Be able to ambulate step through gait with LRAD    Baseline ambulating with [facility]    Time 4    Period Weeks    Status Achieved    Target Date 09/16/19             PT Long Term Goals - 10/21/19 1543      PT LONG TERM GOAL #1   Title be independent in advanced HEP    Period Weeks    Status On-going      PT LONG TERM GOAL #2   Title FOTO < or = to 53% limited    Baseline 36% limitation    Status Achieved      PT LONG TERM GOAL #4   Title able to ambulate in community with 1/10 pain at most due to improved knee strength    Status Achieved                 Plan - 10/21/19 1550    Clinical Impression Statement Pt is making steady progress towards her goals. She is consistently progressing LE strength and proprioception exercises during her session with minimal difficulty. Pt continues to demonstrate difficulty with stability on single limb on the Lt and is limited in hamstring and hip abductor strength vs the Rt.  She would continue to benefit from skilled PT moving forward to address remaining limitations in scar mobility, Lt LE strength and proprioception  to prevent risk of injury and improve endurance at home and in the community.    PT Frequency 2x / week    PT Duration 12 weeks    PT Treatment/Interventions ADLs/Self Care Home Management;Cryotherapy;Ultrasound;Traction;Moist Heat;Electrical  Stimulation;Functional mobility training;Neuromuscular re-education;Therapeutic activities;Therapeutic exercise;Patient/family education;Manual techniques;Taping;Dry needling;Spinal Manipulations;Joint Manipulations    PT Next Visit Plan progress Lt knee strength, functional mobility, and edema management    PT Home Exercise Plan [id number]    Consulted and Agree with Plan of Care Patient           Patient will benefit from skilled therapeutic intervention in order to improve the following deficits and impairments:  Decreased activity tolerance, Decreased strength, Postural dysfunction, Improper body mechanics, Impaired flexibility, Pain, Increased muscle spasms, Decreased range of motion, Abnormal gait, Difficulty walking, Increased edema  Visit Diagnosis: Left knee pain, unspecified chronicity  Stiffness of left knee, not elsewhere classified  Difficulty in walking, not elsewhere classified  Muscle weakness (generalized)     Problem List Patient Active Problem List   Diagnosis Date Noted  . S/P revision of total knee, left 08/15/2019  . Preop cardiovascular exam 08/13/2019  . Osteoarthritis of ankle, right 01/29/2018  . Disorder of tendon of posterior tibial muscle 07/06/2017  . Sleep related headaches 09/07/2016  . Nightmares REM-sleep type 09/07/2016  . RLS (restless legs syndrome) 09/07/2016  . Snoring 09/07/2016  . Excessive daytime sleepiness 09/07/2016  . Migraine 07/28/2016  . Solitary pulmonary nodule 06/08/2016  . S/P knee replacement 05/05/2015  . Former smoker 07/10/2014  . Shortness of breath 06/07/2014  . Chest pain 06/07/2014  . Osteoarthritis 05/16/2014  . Alcoholic fatty liver 62/83/6629  . Chronic post-traumatic stress disorder (PTSD) 05/02/2014  . Sleep disorder 05/02/2014  . Family history of alcoholism 05/02/2014  . Macrocytosis 04/19/2014  . Fatigue 08/15/2013  . Thyroid nodule 09/17/2012  . Hypothyroid 07/21/2010  . Type II diabetes mellitus,  well controlled ([place]) 06/23/2010  . Hypertension associated with diabetes ([place]) 06/23/2010  . Alcohol use disorder, severe, in sustained remission ([place]) 06/23/2010  . Hyperlipidemia associated with type 2 diabetes mellitus ([place]) 06/23/2010  . Morbid obesity ([place]) 06/23/2010  . Chronic depression 06/23/2010  . History of colonic polyps 04/12/2010    Sigurd Sos, PT 10/21/19 4:16 PM  [facility] [address] [place], [place], [address] Phone: [phone]   Fax:  [phone]  Name: Fatima Fedie MRN: [id number] Date of Birth: [birth year]

## 2019-10-23 ENCOUNTER — Ambulatory Visit: Payer: Medicare Other | Attending: Orthopedic Surgery

## 2019-10-23 ENCOUNTER — Other Ambulatory Visit: Payer: Self-pay

## 2019-10-23 DIAGNOSIS — R262 Difficulty in walking, not elsewhere classified: Secondary | ICD-10-CM | POA: Insufficient documentation

## 2019-10-23 DIAGNOSIS — M6281 Muscle weakness (generalized): Secondary | ICD-10-CM | POA: Insufficient documentation

## 2019-10-23 DIAGNOSIS — M25562 Pain in left knee: Secondary | ICD-10-CM | POA: Diagnosis present

## 2019-10-23 DIAGNOSIS — M25662 Stiffness of left knee, not elsewhere classified: Secondary | ICD-10-CM

## 2019-10-23 NOTE — Therapy (Signed)
[facility] [address] [place], [place], [address] Phone: [phone]   Fax:  [phone]  Physical Therapy Treatment  Patient Details  Name: [name] [name] MRN: [id number] Date of Birth: 01-12-1956 Referring Provider (PT): Dr. Paralee Cancel   Encounter Date: 10/23/2019   PT End of Session - 10/23/19 1522    Visit Number 22    Date for PT Re-Evaluation 11/11/19    Authorization Type medicare    Authorization - Visit Number 22    Authorization - Number of Visits 30    Progress Note Due on Visit 30    PT Start Time 1245    PT Stop Time 1537    PT Time Calculation (min) 46 min    Activity Tolerance Patient tolerated treatment well;No increased pain    Behavior During Therapy WFL for tasks assessed/performed           Past Medical History:  Diagnosis Date  . Acute meniscal tear of knee    Left knee  . Alcohol problem drinking    rehab  . Anemia    menstrual related  . Anxiety   . Arthritis    right ankle-neuropathy  . Colon polyps   . Depression   . DM (diabetes mellitus), type 2 ([place]) 12/2009   type 2  . Evalluate for OSA (obstructive sleep apnea) 08/15/2013   No OSA on sleep study but may be underestimated.-never used cpap    . GERD (gastroesophageal reflux disease)    not currently taking.  Marland Kitchen Hyperlipidemia   . Hypertension   . Hypothyroidism   . Liver disease   . Neuromuscular disorder ([place])    neropathy bilateral feet.  . Thyroid disease     Past Surgical History:  Procedure Laterality Date  . ADENOIDECTOMY    . BREAST SURGERY Right    lumpectomy-benign  . [place]   1 time  . CHOLECYSTECTOMY    . INNER EAR SURGERY     left ear had tubes put in and removed, scar tissue built up/ loss of hearing in left ear for over a year  . MASS EXCISION Left 12/16/2013   Procedure: EXCISION LEFT  DISTAL THIGH MASS;  Surgeon: Mauri Pole, MD;  Location: [facility];  Service: Orthopedics;   Laterality: Left;  . right ankle surgery      x 2- no retained hardware  . right rotator cuff surgery      left side  . TONSILLECTOMY    . TOTAL KNEE ARTHROPLASTY  2009   right  . TOTAL KNEE ARTHROPLASTY Left 05/05/2015   Procedure: LEFT TOTAL KNEE ARTHROPLASTY;  Surgeon: Paralee Cancel, MD;  Location: [facility];  Service: Orthopedics;  Laterality: Left;  . TOTAL KNEE REVISION Right 12/16/2013   Procedure: RIGHT TOTAL KNEE REVISION POLY EXCHANGE;  Surgeon: Mauri Pole, MD;  Location: [facility];  Service: Orthopedics;  Laterality: Right;  . TOTAL KNEE REVISION Left 08/15/2019   Procedure: LEFT KNEE REVISION;  Surgeon: Paralee Cancel, MD;  Location: [facility];  Service: Orthopedics;  Laterality: Left;  2 hrs  . TUBAL LIGATION  1987    There were no vitals filed for this visit.   Subjective Assessment - 10/23/19 1453    Subjective I had a bad day yesterday. I had sharp pain in my Lt knee and I would rate them 3/10.    Currently in Pain? No/denies  [facility] PT Treatment/Exercise - 10/23/19 0001      Knee/Hip Exercises: Aerobic   Stationary Bike Level 2x 8 minutes       Knee/Hip Exercises: Machines for Strengthening   Total Gym Leg Press seat 7-Lt only: 105#  x30, bil legs x10      Knee/Hip Exercises: Standing   Forward Step Up Left;2 sets;10 reps;Hand Hold: 1;Step Height: 6"    Forward Step Up Limitations holding #8 in Rt UE    Walking with Sports Cord sidestepping to work on lateral hip stabilizers: 20# x 10 each side    Other Standing Knee Exercises Lt SLS with lateral slider  2x10      Knee/Hip Exercises: Seated   Hamstring Curl Left;Strengthening;2 sets;10 reps    Hamstring Limitations green loop    Sit to Sand 2 sets;10 reps;without UE support   5# kettle bell added     Vasopneumatic   Number Minutes Vasopneumatic  15 minutes    Vasopnuematic Location  Knee    Vasopneumatic Pressure Medium    Vasopneumatic Temperature  3  snowflakes                    PT Short Term Goals - 10/17/19 1106      PT SHORT TERM GOAL #1   Title be independent in initial HEP    Time 4    Period Weeks    Status Achieved    Target Date 09/16/19      PT SHORT TERM GOAL #2   Title demonstrates 90 deg AROM knee flexion and 5 or less deg knee ext for improved gait    Baseline ext: -5, flexion: 110    Time 4    Period Weeks    Status Achieved    Target Date 09/16/19      PT SHORT TERM GOAL #3   Title Be able to ambulate step through gait with LRAD    Baseline ambulating with [facility]    Time 4    Period Weeks    Status Achieved    Target Date 09/16/19             PT Long Term Goals - 10/21/19 1543      PT LONG TERM GOAL #1   Title be independent in advanced HEP    Period Weeks    Status On-going      PT LONG TERM GOAL #2   Title FOTO < or = to 53% limited    Baseline 36% limitation    Status Achieved      PT LONG TERM GOAL #4   Title able to ambulate in community with 1/10 pain at most due to improved knee strength    Status Achieved                 Plan - 10/23/19 1506    Clinical Impression Statement Pt is making steady progress towards her goals. Pt a flare-up of pain with activity yesterday and reports 3/10 Lt knee pain. Pt is consistently progressing LE strength and proprioception exercises during her session with minimal difficulty. Pt continues to demonstrate minor instability with single limb activity on the Lt and is limited in hamstring and hip abductor strength vs the Rt.  She would continue to benefit from skilled PT moving forward to address remaining limitations in scar mobility, Lt LE strength and proprioception to prevent risk of injury and improve endurance at home and in the community.  PT Frequency 2x / week    PT Duration 12 weeks    PT Treatment/Interventions ADLs/Self Care Home Management;Cryotherapy;Ultrasound;Traction;Moist Heat;Electrical Stimulation;Functional mobility  training;Neuromuscular re-education;Therapeutic activities;Therapeutic exercise;Patient/family education;Manual techniques;Taping;Dry needling;Spinal Manipulations;Joint Manipulations    PT Next Visit Plan progress Lt knee strength, functional mobility, and edema management    PT Home Exercise Plan [id number]    Consulted and Agree with Plan of Care Patient           Patient will benefit from skilled therapeutic intervention in order to improve the following deficits and impairments:  Decreased activity tolerance, Decreased strength, Postural dysfunction, Improper body mechanics, Impaired flexibility, Pain, Increased muscle spasms, Decreased range of motion, Abnormal gait, Difficulty walking, Increased edema  Visit Diagnosis: Left knee pain, unspecified chronicity  Stiffness of left knee, not elsewhere classified  Difficulty in walking, not elsewhere classified  Muscle weakness (generalized)     Problem List Patient Active Problem List   Diagnosis Date Noted  . S/P revision of total knee, left 08/15/2019  . Preop cardiovascular exam 08/13/2019  . Osteoarthritis of ankle, right 01/29/2018  . Disorder of tendon of posterior tibial muscle 07/06/2017  . Sleep related headaches 09/07/2016  . Nightmares REM-sleep type 09/07/2016  . RLS (restless legs syndrome) 09/07/2016  . Snoring 09/07/2016  . Excessive daytime sleepiness 09/07/2016  . Migraine 07/28/2016  . Solitary pulmonary nodule 06/08/2016  . S/P knee replacement 05/05/2015  . Former smoker 07/10/2014  . Shortness of breath 06/07/2014  . Chest pain 06/07/2014  . Osteoarthritis 05/16/2014  . Alcoholic fatty liver 92/02/69  . Chronic post-traumatic stress disorder (PTSD) 05/02/2014  . Sleep disorder 05/02/2014  . Family history of alcoholism 05/02/2014  . Macrocytosis 04/19/2014  . Fatigue 08/15/2013  . Thyroid nodule 09/17/2012  . Hypothyroid 07/21/2010  . Type II diabetes mellitus, well controlled ([place]) 06/23/2010    . Hypertension associated with diabetes ([place]) 06/23/2010  . Alcohol use disorder, severe, in sustained remission ([place]) 06/23/2010  . Hyperlipidemia associated with type 2 diabetes mellitus ([place]) 06/23/2010  . Morbid obesity ([place]) 06/23/2010  . Chronic depression 06/23/2010  . History of colonic polyps 04/12/2010     Sigurd Sos, PT 10/23/19 3:24 PM  [facility] [address], [place], [place], [address] Phone: [phone]   Fax:  [phone]  Name: [name] [name] MRN: [id number] Date of Birth: 11/17/1955

## 2019-10-29 ENCOUNTER — Encounter: Payer: Self-pay | Admitting: Physical Therapy

## 2019-10-29 ENCOUNTER — Ambulatory Visit: Payer: Medicare Other | Admitting: Physical Therapy

## 2019-10-29 ENCOUNTER — Other Ambulatory Visit: Payer: Self-pay

## 2019-10-29 DIAGNOSIS — M6281 Muscle weakness (generalized): Secondary | ICD-10-CM

## 2019-10-29 DIAGNOSIS — M25562 Pain in left knee: Secondary | ICD-10-CM | POA: Diagnosis not present

## 2019-10-29 DIAGNOSIS — R262 Difficulty in walking, not elsewhere classified: Secondary | ICD-10-CM

## 2019-10-29 DIAGNOSIS — M25662 Stiffness of left knee, not elsewhere classified: Secondary | ICD-10-CM

## 2019-10-29 NOTE — Therapy (Signed)
[facility] [address] [place], [place], [address] Phone: [phone]   Fax:  [phone]  Physical Therapy Treatment  Patient Details  Name: [name] [name] MRN: [id number] Date of Birth: February 17, 1956 Referring Provider (PT): Dr. Paralee Cancel   Encounter Date: 10/29/2019   PT End of Session - 10/29/19 1450    Visit Number 23    Date for PT Re-Evaluation 11/11/19    Authorization Type medicare    Authorization - Visit Number 23    Authorization - Number of Visits 30    Progress Note Due on Visit 30    PT Start Time 4259    PT Stop Time 5638    PT Time Calculation (min) 45 min    Activity Tolerance Patient tolerated treatment well;No increased pain    Behavior During Therapy WFL for tasks assessed/performed           Past Medical History:  Diagnosis Date  . Acute meniscal tear of knee    Left knee  . Alcohol problem drinking    rehab  . Anemia    menstrual related  . Anxiety   . Arthritis    right ankle-neuropathy  . Colon polyps   . Depression   . DM (diabetes mellitus), type 2 ([place]) 12/2009   type 2  . Evalluate for OSA (obstructive sleep apnea) 08/15/2013   No OSA on sleep study but may be underestimated.-never used cpap    . GERD (gastroesophageal reflux disease)    not currently taking.  Marland Kitchen Hyperlipidemia   . Hypertension   . Hypothyroidism   . Liver disease   . Neuromuscular disorder ([place])    neropathy bilateral feet.  . Thyroid disease     Past Surgical History:  Procedure Laterality Date  . ADENOIDECTOMY    . BREAST SURGERY Right    lumpectomy-benign  . [place]   1 time  . CHOLECYSTECTOMY    . INNER EAR SURGERY     left ear had tubes put in and removed, scar tissue built up/ loss of hearing in left ear for over a year  . MASS EXCISION Left 12/16/2013   Procedure: EXCISION LEFT  DISTAL THIGH MASS;  Surgeon: Mauri Pole, MD;  Location: [facility];  Service: Orthopedics;   Laterality: Left;  . right ankle surgery      x 2- no retained hardware  . right rotator cuff surgery      left side  . TONSILLECTOMY    . TOTAL KNEE ARTHROPLASTY  2009   right  . TOTAL KNEE ARTHROPLASTY Left 05/05/2015   Procedure: LEFT TOTAL KNEE ARTHROPLASTY;  Surgeon: Paralee Cancel, MD;  Location: [facility];  Service: Orthopedics;  Laterality: Left;  . TOTAL KNEE REVISION Right 12/16/2013   Procedure: RIGHT TOTAL KNEE REVISION POLY EXCHANGE;  Surgeon: Mauri Pole, MD;  [facility];  Service: Orthopedics;  Laterality: Right;  . TOTAL KNEE REVISION Left 08/15/2019   Procedure: LEFT KNEE REVISION;  Surgeon: Paralee Cancel, MD;  Location: [facility];  Service: Orthopedics;  Laterality: Left;  2 hrs  . TUBAL LIGATION  1987    There were no vitals filed for this visit.   Subjective Assessment - 10/29/19 1450    Subjective Pt states that she is doing well. No pain currently.    Currently in Pain? No/denies  [place] Adult PT Treatment/Exercise - 10/29/19 0001      Knee/Hip Exercises: Machines for Strengthening   Total Gym Leg Press seat 7: Lt only #105 2x10 reps       Knee/Hip Exercises: Standing   Lateral Step Up Left;2 sets;10 reps;Hand Hold: 1;Step Height: 8"   green foam pad    Other Standing Knee Exercises 3 way Rt hip slider x15 reps each-1 UE support       Knee/Hip Exercises: Supine   Heel Slides Strengthening;Left;2 sets;10 reps    Heel Slides Limitations green TB    Bridges Strengthening;Both;2 sets;10 reps    Bridges Limitations straight leg on red physioball       Vasopneumatic   Number Minutes Vasopneumatic  15 minutes    Vasopnuematic Location  Knee    Vasopneumatic Pressure Medium    Vasopneumatic Temperature  3 snowflakes                  PT Education - 10/29/19 1706    Education Details technique with therex    Person(s) Educated Patient    Methods Explanation;Verbal cues    Comprehension Verbalized  understanding;Returned demonstration            PT Short Term Goals - 10/17/19 1106      PT SHORT TERM GOAL #1   Title be independent in initial HEP    Time 4    Period Weeks    Status Achieved    Target Date 09/16/19      PT SHORT TERM GOAL #2   Title demonstrates 90 deg AROM knee flexion and 5 or less deg knee ext for improved gait    Baseline ext: -5, flexion: 110    Time 4    Period Weeks    Status Achieved    Target Date 09/16/19      PT SHORT TERM GOAL #3   Title Be able to ambulate step through gait with LRAD    Baseline ambulating with [facility]    Time 4    Period Weeks    Status Achieved    Target Date 09/16/19             PT Long Term Goals - 10/21/19 1543      PT LONG TERM GOAL #1   Title be independent in advanced HEP    Period Weeks    Status On-going      PT LONG TERM GOAL #2   Title FOTO < or = to 53% limited    Baseline 36% limitation    Status Achieved      PT LONG TERM GOAL #4   Title able to ambulate in community with 1/10 pain at most due to improved knee strength    Status Achieved                 Plan - 10/29/19 1707    Clinical Impression Statement Pt's knee pain was improved following her most recent session. Continued with focus on increasing glute/quad strength. Pt was able to complete step up onto foam surface, but she required 1 UE support for safety. Pt had fatigue and muscle shaking with lateral lunges secondary to glute weakness. Pt's scar mobility is greatly improved, with minimal adhesions remaining. Ended with game ready to prevent swelling following progressions in all of today's exercises.    PT Frequency 2x / week    PT Duration 12 weeks    PT Treatment/Interventions ADLs/Self Care Home Management;Cryotherapy;Ultrasound;Traction;Moist Heat;Electrical Stimulation;Functional  mobility training;Neuromuscular re-education;Therapeutic activities;Therapeutic exercise;Patient/family education;Manual techniques;Taping;Dry  needling;Spinal Manipulations;Joint Manipulations    PT Next Visit Plan progress Lt knee strength, functional mobility, and edema management    PT Home Exercise Plan [id number]    Consulted and Agree with Plan of Care Patient           Patient will benefit from skilled therapeutic intervention in order to improve the following deficits and impairments:  Decreased activity tolerance, Decreased strength, Postural dysfunction, Improper body mechanics, Impaired flexibility, Pain, Increased muscle spasms, Decreased range of motion, Abnormal gait, Difficulty walking, Increased edema  Visit Diagnosis: Left knee pain, unspecified chronicity  Stiffness of left knee, not elsewhere classified  Difficulty in walking, not elsewhere classified  Muscle weakness (generalized)     Problem List Patient Active Problem List   Diagnosis Date Noted  . S/P revision of total knee, left 08/15/2019  . Preop cardiovascular exam 08/13/2019  . Osteoarthritis of ankle, right 01/29/2018  . Disorder of tendon of posterior tibial muscle 07/06/2017  . Sleep related headaches 09/07/2016  . Nightmares REM-sleep type 09/07/2016  . RLS (restless legs syndrome) 09/07/2016  . Snoring 09/07/2016  . Excessive daytime sleepiness 09/07/2016  . Migraine 07/28/2016  . Solitary pulmonary nodule 06/08/2016  . S/P knee replacement 05/05/2015  . Former smoker 07/10/2014  . Shortness of breath 06/07/2014  . Chest pain 06/07/2014  . Osteoarthritis 05/16/2014  . Alcoholic fatty liver 63/78/5885  . Chronic post-traumatic stress disorder (PTSD) 05/02/2014  . Sleep disorder 05/02/2014  . Family history of alcoholism 05/02/2014  . Macrocytosis 04/19/2014  . Fatigue 08/15/2013  . Thyroid nodule 09/17/2012  . Hypothyroid 07/21/2010  . Type II diabetes mellitus, well controlled ([place]) 06/23/2010  . Hypertension associated with diabetes ([place]) 06/23/2010  . Alcohol use disorder, severe, in sustained remission ([place]) 06/23/2010    . Hyperlipidemia associated with type 2 diabetes mellitus ([place]) 06/23/2010  . Morbid obesity ([place]) 06/23/2010  . Chronic depression 06/23/2010  . History of colonic polyps 04/12/2010    5:12 PM,10/29/19 Sherol Dade PT, DPT [facility] [address] [place], [place], [address] Phone: [phone]   Fax:  [phone]  Name: [name] [name] MRN: [id number] Date of Birth: 03/16/1955

## 2019-10-31 ENCOUNTER — Encounter: Payer: Self-pay | Admitting: Physical Therapy

## 2019-10-31 ENCOUNTER — Ambulatory Visit: Payer: Medicare Other | Admitting: Physical Therapy

## 2019-10-31 ENCOUNTER — Other Ambulatory Visit: Payer: Self-pay

## 2019-10-31 DIAGNOSIS — M6281 Muscle weakness (generalized): Secondary | ICD-10-CM

## 2019-10-31 DIAGNOSIS — M25562 Pain in left knee: Secondary | ICD-10-CM | POA: Diagnosis not present

## 2019-10-31 DIAGNOSIS — M25662 Stiffness of left knee, not elsewhere classified: Secondary | ICD-10-CM

## 2019-10-31 DIAGNOSIS — R262 Difficulty in walking, not elsewhere classified: Secondary | ICD-10-CM

## 2019-10-31 NOTE — Therapy (Signed)
[facility] [address], [place], [place], [address] Phone: [phone]   Fax:  [phone]  Physical Therapy Treatment  Patient Details  Name: [name] [name] MRN: [id number] Date of Birth: [birth year] Referring Provider (PT): Dr. Paralee Cancel   Encounter Date: 10/31/2019   PT End of Session - 10/31/19 1521    Visit Number 24    Date for PT Re-Evaluation 11/11/19    Authorization Type medicare    Authorization - Visit Number 24    Authorization - Number of Visits 30    Progress Note Due on Visit 30    PT Start Time 1448    PT Stop Time 1530    PT Time Calculation (min) 44 min    Activity Tolerance Patient tolerated treatment well;No increased pain    Behavior During Therapy WFL for tasks assessed/performed           Past Medical History:  Diagnosis Date  . Acute meniscal tear of knee    Left knee  . Alcohol problem drinking    rehab  . Anemia    menstrual related  . Anxiety   . Arthritis    right ankle-neuropathy  . Colon polyps   . Depression   . DM (diabetes mellitus), type 2 ([place]) 12/2009   type 2  . Evalluate for OSA (obstructive sleep apnea) 08/15/2013   No OSA on sleep study but may be underestimated.-never used cpap    . GERD (gastroesophageal reflux disease)    not currently taking.  Marland Kitchen Hyperlipidemia   . Hypertension   . Hypothyroidism   . Liver disease   . Neuromuscular disorder ([place])    neropathy bilateral feet.  . Thyroid disease     Past Surgical History:  Procedure Laterality Date  . ADENOIDECTOMY    . BREAST SURGERY Right    lumpectomy-benign  . [place]   1 time  . CHOLECYSTECTOMY    . INNER EAR SURGERY     left ear had tubes put in and removed, scar tissue built up/ loss of hearing in left ear for over a year  . MASS EXCISION Left 12/16/2013   Procedure: EXCISION LEFT  DISTAL THIGH MASS;  Surgeon: Mauri Pole, MD;  Location: [facility];  Service: Orthopedics;   Laterality: Left;  . right ankle surgery      x 2- no retained hardware  . right rotator cuff surgery      left side  . TONSILLECTOMY    . TOTAL KNEE ARTHROPLASTY  2009   right  . TOTAL KNEE ARTHROPLASTY Left 05/05/2015   Procedure: LEFT TOTAL KNEE ARTHROPLASTY;  Surgeon: Paralee Cancel, MD;  [facility];  Service: Orthopedics;  Laterality: Left;  . TOTAL KNEE REVISION Right 12/16/2013   Procedure: RIGHT TOTAL KNEE REVISION POLY EXCHANGE;  Surgeon: Mauri Pole, MD;  [facility];  Service: Orthopedics;  Laterality: Right;  . TOTAL KNEE REVISION Left 08/15/2019   Procedure: LEFT KNEE REVISION;  Surgeon: Paralee Cancel, MD;  [facility];  Service: Orthopedics;  Laterality: Left;  2 hrs  . TUBAL LIGATION  1987    There were no vitals filed for this visit.   Subjective Assessment - 10/31/19 1447    Subjective Pt states that things are going well.    Currently in Pain? No/denies  [place] Adult PT Treatment/Exercise - 10/31/19 0001      Knee/Hip Exercises: Aerobic   Stationary Bike L3 x6 min PT present to discuss plan-LE endurance       Knee/Hip Exercises: Machines for Strengthening   Cybex Knee Flexion Lt only: #15 3x10 reps     Total Gym Leg Press Seat 7: Lt only #125 2x10 reps       Knee/Hip Exercises: Standing   Heel Raises Both;1 set;20 reps    Heel Raises Limitations off step     SLS Lt only: firm surface 3x10 sec eyes open, 3x10 sec eyes closed- 1 UE support     Other Standing Knee Exercises side step over/back: 12" hurdle x10 reps BUE support       Knee/Hip Exercises: Seated   Sit to Sand 2 sets;10 reps;Other (comment)   holding #10 kettlebell      Knee/Hip Exercises: Sidelying   Hip ABduction Strengthening;Right;Left;1 set;10 reps    Hip ABduction Limitations 2nd set on Lt       Vasopneumatic   Number Minutes Vasopneumatic  15 minutes    Vasopnuematic Location  Knee    Vasopneumatic Pressure Medium     Vasopneumatic Temperature  3 snowflakes                  PT Education - 10/31/19 1517    Education Details technique with therex    Person(s) Educated Patient    Methods Explanation;Verbal cues    Comprehension Verbalized understanding;Returned demonstration            PT Short Term Goals - 10/17/19 1106      PT SHORT TERM GOAL #1   Title be independent in initial HEP    Time 4    Period Weeks    Status Achieved    Target Date 09/16/19      PT SHORT TERM GOAL #2   Title demonstrates 90 deg AROM knee flexion and 5 or less deg knee ext for improved gait    Baseline ext: -5, flexion: 110    Time 4    Period Weeks    Status Achieved    Target Date 09/16/19      PT SHORT TERM GOAL #3   Title Be able to ambulate step through gait with LRAD    Baseline ambulating with [facility]    Time 4    Period Weeks    Status Achieved    Target Date 09/16/19             PT Long Term Goals - 10/21/19 1543      PT LONG TERM GOAL #1   Title be independent in advanced HEP    Period Weeks    Status On-going      PT LONG TERM GOAL #2   Title FOTO < or = to 53% limited    Baseline 36% limitation    Status Achieved      PT LONG TERM GOAL #4   Title able to ambulate in community with 1/10 pain at most due to improved knee strength    Status Achieved                 Plan - 10/31/19 1521    Clinical Impression Statement Pt's LE strength is progressing well. She was able to increase resistance with the leg press today. Pt had difficulty with hip flexion step over large hurdle secondary to hip weakness and impaired single leg stability. She denied pain  with all of today's exercises. Will continue with strengthening and proprioceptive activity and plan for d/c in the next 2-3 visits.    PT Frequency 2x / week    PT Duration 12 weeks    PT Treatment/Interventions ADLs/Self Care Home Management;Cryotherapy;Ultrasound;Traction;Moist Heat;Electrical Stimulation;Functional  mobility training;Neuromuscular re-education;Therapeutic activities;Therapeutic exercise;Patient/family education;Manual techniques;Taping;Dry needling;Spinal Manipulations;Joint Manipulations    PT Next Visit Plan progress hip strength, proprioception and endurance    PT Home Exercise Plan [id number]    Consulted and Agree with Plan of Care Patient           Patient will benefit from skilled therapeutic intervention in order to improve the following deficits and impairments:  Decreased activity tolerance, Decreased strength, Postural dysfunction, Improper body mechanics, Impaired flexibility, Pain, Increased muscle spasms, Decreased range of motion, Abnormal gait, Difficulty walking, Increased edema  Visit Diagnosis: Left knee pain, unspecified chronicity  Stiffness of left knee, not elsewhere classified  Difficulty in walking, not elsewhere classified  Muscle weakness (generalized)     Problem List Patient Active Problem List   Diagnosis Date Noted  . S/P revision of total knee, left 08/15/2019  . Preop cardiovascular exam 08/13/2019  . Osteoarthritis of ankle, right 01/29/2018  . Disorder of tendon of posterior tibial muscle 07/06/2017  . Sleep related headaches 09/07/2016  . Nightmares REM-sleep type 09/07/2016  . RLS (restless legs syndrome) 09/07/2016  . Snoring 09/07/2016  . Excessive daytime sleepiness 09/07/2016  . Migraine 07/28/2016  . Solitary pulmonary nodule 06/08/2016  . S/P knee replacement 05/05/2015  . Former smoker 07/10/2014  . Shortness of breath 06/07/2014  . Chest pain 06/07/2014  . Osteoarthritis 05/16/2014  . Alcoholic fatty liver 11/13/3005  . Chronic post-traumatic stress disorder (PTSD) 05/02/2014  . Sleep disorder 05/02/2014  . Family history of alcoholism 05/02/2014  . Macrocytosis 04/19/2014  . Fatigue 08/15/2013  . Thyroid nodule 09/17/2012  . Hypothyroid 07/21/2010  . Type II diabetes mellitus, well controlled ([place]) 06/23/2010  .  Hypertension associated with diabetes ([place]) 06/23/2010  . Alcohol use disorder, severe, in sustained remission ([place]) 06/23/2010  . Hyperlipidemia associated with type 2 diabetes mellitus ([place]) 06/23/2010  . Morbid obesity ([place]) 06/23/2010  . Chronic depression 06/23/2010  . History of colonic polyps 04/12/2010    3:40 PM,10/31/19 Sherol Dade PT, DPT [facility] [address], [place], [place], [address] Phone: [phone]   Fax:  [phone]  Name: [name] [name] MRN: [id number] Date of Birth: Oct 24, 1955

## 2019-11-04 ENCOUNTER — Other Ambulatory Visit: Payer: Self-pay

## 2019-11-04 ENCOUNTER — Ambulatory Visit: Payer: Medicare Other

## 2019-11-04 DIAGNOSIS — M6281 Muscle weakness (generalized): Secondary | ICD-10-CM

## 2019-11-04 DIAGNOSIS — R262 Difficulty in walking, not elsewhere classified: Secondary | ICD-10-CM

## 2019-11-04 DIAGNOSIS — M25662 Stiffness of left knee, not elsewhere classified: Secondary | ICD-10-CM

## 2019-11-04 DIAGNOSIS — M25562 Pain in left knee: Secondary | ICD-10-CM | POA: Diagnosis not present

## 2019-11-04 NOTE — Therapy (Signed)
[facility] [address] [place], [place], [address] Phone: [phone]   Fax:  [phone]  Physical Therapy Treatment  Patient Details  Name: [name] [name] MRN: [id number] Date of Birth: 11/26/1955 Referring Provider (PT): Dr. Paralee Cancel   Encounter Date: 11/04/2019   PT End of Session - 11/04/19 1606    Visit Number 25    Date for PT Re-Evaluation 11/11/19    Authorization Type medicare    Authorization - Visit Number 25    Authorization - Number of Visits 30    Progress Note Due on Visit 30    PT Start Time 3545    PT Stop Time 1620    PT Time Calculation (min) 50 min    Activity Tolerance Patient tolerated treatment well;No increased pain    Behavior During Therapy WFL for tasks assessed/performed           Past Medical History:  Diagnosis Date  . Acute meniscal tear of knee    Left knee  . Alcohol problem drinking    rehab  . Anemia    menstrual related  . Anxiety   . Arthritis    right ankle-neuropathy  . Colon polyps   . Depression   . DM (diabetes mellitus), type 2 ([place]) 12/2009   type 2  . Evalluate for OSA (obstructive sleep apnea) 08/15/2013   No OSA on sleep study but may be underestimated.-never used cpap    . GERD (gastroesophageal reflux disease)    not currently taking.  Marland Kitchen Hyperlipidemia   . Hypertension   . Hypothyroidism   . Liver disease   . Neuromuscular disorder ([place])    neropathy bilateral feet.  . Thyroid disease     Past Surgical History:  Procedure Laterality Date  . ADENOIDECTOMY    . BREAST SURGERY Right    lumpectomy-benign  . [place]   1 time  . CHOLECYSTECTOMY    . INNER EAR SURGERY     left ear had tubes put in and removed, scar tissue built up/ loss of hearing in left ear for over a year  . MASS EXCISION Left 12/16/2013   Procedure: EXCISION LEFT  DISTAL THIGH MASS;  Surgeon: Mauri Pole, MD;  [facility];  Service:  Orthopedics;  Laterality: Left;  . right ankle surgery      x 2- no retained hardware  . right rotator cuff surgery      left side  . TONSILLECTOMY    . TOTAL KNEE ARTHROPLASTY  2009   right  . TOTAL KNEE ARTHROPLASTY Left 05/05/2015   Procedure: LEFT TOTAL KNEE ARTHROPLASTY;  Surgeon: Paralee Cancel, MD;  [facility];  Service: Orthopedics;  Laterality: Left;  . TOTAL KNEE REVISION Right 12/16/2013   Procedure: RIGHT TOTAL KNEE REVISION POLY EXCHANGE;  Surgeon: Mauri Pole, MD;  [facility];  Service: Orthopedics;  Laterality: Right;  . TOTAL KNEE REVISION Left 08/15/2019   Procedure: LEFT KNEE REVISION;  Surgeon: Paralee Cancel, MD;  [facility];  Service: Orthopedics;  Laterality: Left;  2 hrs  . TUBAL LIGATION  1987    There were no vitals filed for this visit.   Subjective Assessment - 11/04/19 1537    Subjective I am doing well.  No pain.    Currently in Pain? No/denies  [place] Adult PT Treatment/Exercise - 11/04/19 0001      Knee/Hip Exercises: Aerobic   Stationary Bike L3 x6 min PT present to discuss plan-LE endurance       Knee/Hip Exercises: Machines for Strengthening   Cybex Knee Extension 10# up with both legs, down with Lt eccentric control 2x10    Cybex Knee Flexion Lt only: 15# 3x10 reps     Total Gym Leg Press Seat 7: Lt only #125 3x10 reps alternating between bil legs and Lt single leg      Knee/Hip Exercises: Standing   Walking with Sports Cord 25# forward and reverse, 15# sidestepping x10 each      Vasopneumatic   Number Minutes Vasopneumatic  15 minutes    Vasopnuematic Location  Knee    Vasopneumatic Pressure Medium    Vasopneumatic Temperature  3 snowflakes                    PT Short Term Goals - 10/17/19 1106      PT SHORT TERM GOAL #1   Title be independent in initial HEP    Time 4    Period Weeks    Status Achieved    Target Date 09/16/19      PT SHORT TERM GOAL #2    Title demonstrates 90 deg AROM knee flexion and 5 or less deg knee ext for improved gait    Baseline ext: -5, flexion: 110    Time 4    Period Weeks    Status Achieved    Target Date 09/16/19      PT SHORT TERM GOAL #3   Title Be able to ambulate step through gait with LRAD    Baseline ambulating with [facility]    Time 4    Period Weeks    Status Achieved    Target Date 09/16/19             PT Long Term Goals - 10/21/19 1543      PT LONG TERM GOAL #1   Title be independent in advanced HEP    Period Weeks    Status On-going      PT LONG TERM GOAL #2   Title FOTO < or = to 53% limited    Baseline 36% limitation    Status Achieved      PT LONG TERM GOAL #4   Title able to ambulate in community with 1/10 pain at most due to improved knee strength    Status Achieved                 Plan - 11/04/19 1551    Clinical Impression Statement Pt's LE strength is progressing well. She was able to increase resistance on leg press with single leg and added leg extension with eccentric control. Pt with instability with resisted walking due to limited proprioception although this is improving. Will continue with strengthening and proprioceptive activity and plan for d/c in the next 2 sessions.    PT Frequency 2x / week    PT Duration 12 weeks    PT Treatment/Interventions ADLs/Self Care Home Management;Cryotherapy;Ultrasound;Traction;Moist Heat;Electrical Stimulation;Functional mobility training;Neuromuscular re-education;Therapeutic activities;Therapeutic exercise;Patient/family education;Manual techniques;Taping;Dry needling;Spinal Manipulations;Joint Manipulations    PT Next Visit Plan progress hip strength, proprioception and endurance.  Edema management    PT Home Exercise Plan [id number]    Consulted and Agree with Plan of Care Patient           Patient will benefit from skilled therapeutic intervention  in order to improve the following deficits and impairments:  Decreased  activity tolerance, Decreased strength, Postural dysfunction, Improper body mechanics, Impaired flexibility, Pain, Increased muscle spasms, Decreased range of motion, Abnormal gait, Difficulty walking, Increased edema  Visit Diagnosis: Left knee pain, unspecified chronicity  Stiffness of left knee, not elsewhere classified  Difficulty in walking, not elsewhere classified  Muscle weakness (generalized)     Problem List Patient Active Problem List   Diagnosis Date Noted  . S/P revision of total knee, left 08/15/2019  . Preop cardiovascular exam 08/13/2019  . Osteoarthritis of ankle, right 01/29/2018  . Disorder of tendon of posterior tibial muscle 07/06/2017  . Sleep related headaches 09/07/2016  . Nightmares REM-sleep type 09/07/2016  . RLS (restless legs syndrome) 09/07/2016  . Snoring 09/07/2016  . Excessive daytime sleepiness 09/07/2016  . Migraine 07/28/2016  . Solitary pulmonary nodule 06/08/2016  . S/P knee replacement 05/05/2015  . Former smoker 07/10/2014  . Shortness of breath 06/07/2014  . Chest pain 06/07/2014  . Osteoarthritis 05/16/2014  . Alcoholic fatty liver 38/88/2800  . Chronic post-traumatic stress disorder (PTSD) 05/02/2014  . Sleep disorder 05/02/2014  . Family history of alcoholism 05/02/2014  . Macrocytosis 04/19/2014  . Fatigue 08/15/2013  . Thyroid nodule 09/17/2012  . Hypothyroid 07/21/2010  . Type II diabetes mellitus, well controlled ([place]) 06/23/2010  . Hypertension associated with diabetes ([place]) 06/23/2010  . Alcohol use disorder, severe, in sustained remission ([place]) 06/23/2010  . Hyperlipidemia associated with type 2 diabetes mellitus ([place]) 06/23/2010  . Morbid obesity ([place]) 06/23/2010  . Chronic depression 06/23/2010  . History of colonic polyps 04/12/2010   Sigurd Sos, PT 11/04/19 4:07 PM  [facility] [address] [place], [place], [address] Phone: [phone]   Fax:   [phone]  Name: [name] [name] MRN: [id number] Date of Birth: 14-Jun-1955

## 2019-11-06 ENCOUNTER — Ambulatory Visit: Payer: Medicare Other

## 2019-11-06 ENCOUNTER — Other Ambulatory Visit: Payer: Self-pay

## 2019-11-06 DIAGNOSIS — M25662 Stiffness of left knee, not elsewhere classified: Secondary | ICD-10-CM

## 2019-11-06 DIAGNOSIS — M25562 Pain in left knee: Secondary | ICD-10-CM

## 2019-11-06 DIAGNOSIS — R262 Difficulty in walking, not elsewhere classified: Secondary | ICD-10-CM

## 2019-11-06 DIAGNOSIS — M6281 Muscle weakness (generalized): Secondary | ICD-10-CM

## 2019-11-06 NOTE — Therapy (Signed)
[facility] [address] [place], [place], [address] Phone: [phone]   Fax:  [phone]  Physical Therapy Treatment  Patient Details  Name: [name] [name] MRN: [id number] Date of Birth: 17-May-1955 Referring Provider (PT): Dr. Paralee Cancel   Encounter Date: 11/06/2019   PT End of Session - 11/06/19 1602    Visit Number 26    Date for PT Re-Evaluation 11/11/19    Authorization Type medicare    Authorization - Visit Number 26    Authorization - Number of Visits 30    Progress Note Due on Visit 30    PT Start Time 1324    PT Stop Time 4010    PT Time Calculation (min) 52 min    Activity Tolerance Patient tolerated treatment well;No increased pain    Behavior During Therapy WFL for tasks assessed/performed           Past Medical History:  Diagnosis Date  . Acute meniscal tear of knee    Left knee  . Alcohol problem drinking    rehab  . Anemia    menstrual related  . Anxiety   . Arthritis    right ankle-neuropathy  . Colon polyps   . Depression   . DM (diabetes mellitus), type 2 ([place]) 12/2009   type 2  . Evalluate for OSA (obstructive sleep apnea) 08/15/2013   No OSA on sleep study but may be underestimated.-never used cpap    . GERD (gastroesophageal reflux disease)    not currently taking.  Marland Kitchen Hyperlipidemia   . Hypertension   . Hypothyroidism   . Liver disease   . Neuromuscular disorder ([place])    neropathy bilateral feet.  . Thyroid disease     Past Surgical History:  Procedure Laterality Date  . ADENOIDECTOMY    . BREAST SURGERY Right    lumpectomy-benign  . [place]   1 time  . CHOLECYSTECTOMY    . INNER EAR SURGERY     left ear had tubes put in and removed, scar tissue built up/ loss of hearing in left ear for over a year  . MASS EXCISION Left 12/16/2013   Procedure: EXCISION LEFT  DISTAL THIGH MASS;  Surgeon: Mauri Pole, MD;  [facility];  Service:  Orthopedics;  Laterality: Left;  . right ankle surgery      x 2- no retained hardware  . right rotator cuff surgery      left side  . TONSILLECTOMY    . TOTAL KNEE ARTHROPLASTY  2009   right  . TOTAL KNEE ARTHROPLASTY Left 05/05/2015   Procedure: LEFT TOTAL KNEE ARTHROPLASTY;  Surgeon: Paralee Cancel, MD;  [facility];  Service: Orthopedics;  Laterality: Left;  . TOTAL KNEE REVISION Right 12/16/2013   Procedure: RIGHT TOTAL KNEE REVISION POLY EXCHANGE;  Surgeon: Mauri Pole, MD;  [facility];  Service: Orthopedics;  Laterality: Right;  . TOTAL KNEE REVISION Left 08/15/2019   Procedure: LEFT KNEE REVISION;  Surgeon: Paralee Cancel, MD;  [facility];  Service: Orthopedics;  Laterality: Left;  2 hrs  . TUBAL LIGATION  1987    There were no vitals filed for this visit.   Subjective Assessment - 11/06/19 1527    Currently in Pain? No/denies    Pain Score --   Lt knee max pain: 4/10  [place] Adult PT Treatment/Exercise - 11/06/19 0001      Knee/Hip Exercises: Aerobic   Stationary Bike L3 x6 min PT present to discuss plan-LE endurance       Knee/Hip Exercises: Machines for Strengthening   Cybex Knee Extension 10# up with both legs, down with Lt eccentric control 2x10    Cybex Knee Flexion Lt only: 15# 3x10 reps     Total Gym Leg Press Seat 7: Lt only #125 3x10 reps alternating between bil legs and Lt single leg      Knee/Hip Exercises: Standing   Walking with Sports Cord 25# forward and reverse, 15# sidestepping x10 each      Vasopneumatic   Number Minutes Vasopneumatic  15 minutes    Vasopnuematic Location  Knee    Vasopneumatic Pressure Medium    Vasopneumatic Temperature  3 snowflakes                    PT Short Term Goals - 10/17/19 1106      PT SHORT TERM GOAL #1   Title be independent in initial HEP    Time 4    Period Weeks    Status Achieved    Target Date 09/16/19      PT SHORT TERM GOAL #2    Title demonstrates 90 deg AROM knee flexion and 5 or less deg knee ext for improved gait    Baseline ext: -5, flexion: 110    Time 4    Period Weeks    Status Achieved    Target Date 09/16/19      PT SHORT TERM GOAL #3   Title Be able to ambulate step through gait with LRAD    Baseline ambulating with SPC    Time 4    Period Weeks    Status Achieved    Target Date 09/16/19             PT Long Term Goals - 11/06/19 1531      PT LONG TERM GOAL #1   Title be independent in advanced HEP    Time 12    Period Weeks    Status On-going      PT LONG TERM GOAL #4   Title able to ambulate in community with 1/10 pain at most due to improved knee strength    Status Achieved                 Plan - 11/06/19 1544    Clinical Impression Statement Pt's LE strength is progressing well and does well with weight training in the clinic.  Pt with instability with resisted walking due to limited proprioception although this is improving.  Pt denies any increase in Lt knee pain with community ambulation.  Pt with max pain of 4/10 in the Lt knee without cause. Pt requires minimal and intermittent cueing for technique. Will continue with strengthening and proprioceptive activity and plan for D/C next visit.    PT Frequency 2x / week    PT Duration 12 weeks    PT Treatment/Interventions ADLs/Self Care Home Management;Cryotherapy;Ultrasound;Traction;Moist Heat;Electrical Stimulation;Functional mobility training;Neuromuscular re-education;Therapeutic activities;Therapeutic exercise;Patient/family education;Manual techniques;Taping;Dry needling;Spinal Manipulations;Joint Manipulations    PT Next Visit Plan FOTO and D/C next session    PT Home Exercise Plan [id number]    Consulted and Agree with Plan of Care Patient           Patient will benefit from skilled therapeutic intervention in order to improve the following deficits and impairments:  Decreased activity tolerance, Decreased strength,  Postural dysfunction, Improper body mechanics, Impaired flexibility, Pain, Increased muscle spasms, Decreased range of motion, Abnormal gait, Difficulty walking, Increased edema  Visit Diagnosis: Left knee pain, unspecified chronicity  Stiffness of left knee, not elsewhere classified  Difficulty in walking, not elsewhere classified  Muscle weakness (generalized)     Problem List Patient Active Problem List   Diagnosis Date Noted  . S/P revision of total knee, left 08/15/2019  . Preop cardiovascular exam 08/13/2019  . Osteoarthritis of ankle, right 01/29/2018  . Disorder of tendon of posterior tibial muscle 07/06/2017  . Sleep related headaches 09/07/2016  . Nightmares REM-sleep type 09/07/2016  . RLS (restless legs syndrome) 09/07/2016  . Snoring 09/07/2016  . Excessive daytime sleepiness 09/07/2016  . Migraine 07/28/2016  . Solitary pulmonary nodule 06/08/2016  . S/P knee replacement 05/05/2015  . Former smoker 07/10/2014  . Shortness of breath 06/07/2014  . Chest pain 06/07/2014  . Osteoarthritis 05/16/2014  . Alcoholic fatty liver 72/82/0601  . Chronic post-traumatic stress disorder (PTSD) 05/02/2014  . Sleep disorder 05/02/2014  . Family history of alcoholism 05/02/2014  . Macrocytosis 04/19/2014  . Fatigue 08/15/2013  . Thyroid nodule 09/17/2012  . Hypothyroid 07/21/2010  . Type II diabetes mellitus, well controlled ([place]) 06/23/2010  . Hypertension associated with diabetes ([place]) 06/23/2010  . Alcohol use disorder, severe, in sustained remission ([place]) 06/23/2010  . Hyperlipidemia associated with type 2 diabetes mellitus ([place]) 06/23/2010  . Morbid obesity ([place]) 06/23/2010  . Chronic depression 06/23/2010  . History of colonic polyps 04/12/2010   Sigurd Sos, PT 11/06/19 4:03 PM  [facility] [address] [place], [place], [address] Phone: [phone]   Fax:  [phone]  Name: Ebelyn Bohnet MRN: [id number] Date of Birth: [birth year]

## 2019-11-11 ENCOUNTER — Other Ambulatory Visit: Payer: Self-pay

## 2019-11-11 ENCOUNTER — Ambulatory Visit: Payer: Medicare Other

## 2019-11-11 DIAGNOSIS — R262 Difficulty in walking, not elsewhere classified: Secondary | ICD-10-CM

## 2019-11-11 DIAGNOSIS — M25662 Stiffness of left knee, not elsewhere classified: Secondary | ICD-10-CM

## 2019-11-11 DIAGNOSIS — M25562 Pain in left knee: Secondary | ICD-10-CM | POA: Diagnosis not present

## 2019-11-11 DIAGNOSIS — M6281 Muscle weakness (generalized): Secondary | ICD-10-CM

## 2019-11-11 NOTE — Therapy (Signed)
[facility] [address] [place], [place], [address] Phone: [phone]   Fax:  [phone]  Physical Therapy Treatment  Patient Details  Name: [name] [name] MRN: [id number] Date of Birth: [birth year] Referring Provider (PT): Dr. Paralee Cancel   Encounter Date: 11/11/2019   PT End of Session - 11/11/19 1606    Visit Number 45    PT Start Time 6256    PT Stop Time 1622    PT Time Calculation (min) 42 min    Activity Tolerance Patient tolerated treatment well;No increased pain    Behavior During Therapy WFL for tasks assessed/performed           Past Medical History:  Diagnosis Date  . Acute meniscal tear of knee    Left knee  . Alcohol problem drinking    rehab  . Anemia    menstrual related  . Anxiety   . Arthritis    right ankle-neuropathy  . Colon polyps   . Depression   . DM (diabetes mellitus), type 2 ([place]) 12/2009   type 2  . Evalluate for OSA (obstructive sleep apnea) 08/15/2013   No OSA on sleep study but may be underestimated.-never used cpap    . GERD (gastroesophageal reflux disease)    not currently taking.  Marland Kitchen Hyperlipidemia   . Hypertension   . Hypothyroidism   . Liver disease   . Neuromuscular disorder ([place])    neropathy bilateral feet.  . Thyroid disease     Past Surgical History:  Procedure Laterality Date  . ADENOIDECTOMY    . BREAST SURGERY Right    lumpectomy-benign  . [place]   1 time  . CHOLECYSTECTOMY    . INNER EAR SURGERY     left ear had tubes put in and removed, scar tissue built up/ loss of hearing in left ear for over a year  . MASS EXCISION Left 12/16/2013   Procedure: EXCISION LEFT  DISTAL THIGH MASS;  Surgeon: Mauri Pole, MD;  Location: [facility];  Service: Orthopedics;  Laterality: Left;  . right ankle surgery      x 2- no retained hardware  . right rotator cuff surgery      left side  . TONSILLECTOMY    . TOTAL KNEE ARTHROPLASTY  2009    right  . TOTAL KNEE ARTHROPLASTY Left 05/05/2015   Procedure: LEFT TOTAL KNEE ARTHROPLASTY;  Surgeon: Paralee Cancel, MD;  Location: [facility];  Service: Orthopedics;  Laterality: Left;  . TOTAL KNEE REVISION Right 12/16/2013   Procedure: RIGHT TOTAL KNEE REVISION POLY EXCHANGE;  Surgeon: Mauri Pole, MD;  Location: [facility];  Service: Orthopedics;  Laterality: Right;  . TOTAL KNEE REVISION Left 08/15/2019   Procedure: LEFT KNEE REVISION;  Surgeon: Paralee Cancel, MD;  Location: [facility];  Service: Orthopedics;  Laterality: Left;  2 hrs  . TUBAL LIGATION  1987    There were no vitals filed for this visit.   Subjective Assessment - 11/11/19 1544    Subjective I'm ready to D/C.  No issues with my knee.    Pain Score 0-No pain              [facility] PT Assessment - 11/11/19 0001      Assessment   Medical Diagnosis Lt Knee TKA revision    Referring Provider (PT) Dr. Paralee Cancel    Onset Date/Surgical Date 08/15/19    Hand Dominance Right      Observation/Other  Assessments   Focus on Therapeutic Outcomes (FOTO)  36% limited (10/03/19)      ROM / Strength   AROM / PROM / Strength AROM      AROM   AROM Assessment Site Knee    Right/Left Knee Left    Left Knee Extension 3    Left Knee Flexion 120      Strength   Left Hip Flexion 5/5    Left Hip ABduction 5/5    Left Knee Flexion 5/5    Left Knee Extension 5/5                         [facility] Adult PT Treatment/Exercise - 11/11/19 0001      Knee/Hip Exercises: Machines for Strengthening   Cybex Knee Extension 10# up with both legs, down with Lt eccentric control 2x10    Cybex Knee Flexion Lt only: 15# 3x10 reps     Total Gym Leg Press Seat 7: Lt only #125 3x10 reps alternating between bil legs and Lt single leg      Knee/Hip Exercises: Standing   Walking with Sports Cord 25# forward and reverse, 15# sidestepping x10 each      Vasopneumatic   Number Minutes Vasopneumatic  15 minutes    Vasopnuematic Location  Knee     Vasopneumatic Pressure Medium    Vasopneumatic Temperature  3 snowflakes                    PT Short Term Goals - 10/17/19 1106      PT SHORT TERM GOAL #1   Title be independent in initial HEP    Time 4    Period Weeks    Status Achieved    Target Date 09/16/19      PT SHORT TERM GOAL #2   Title demonstrates 90 deg AROM knee flexion and 5 or less deg knee ext for improved gait    Baseline ext: -5, flexion: 110    Time 4    Period Weeks    Status Achieved    Target Date 09/16/19      PT SHORT TERM GOAL #3   Title Be able to ambulate step through gait with LRAD    Baseline ambulating with SPC    Time 4    Period Weeks    Status Achieved    Target Date 09/16/19             PT Long Term Goals - 11/11/19 1541      PT LONG TERM GOAL #1   Title be independent in advanced HEP    Status Achieved      PT LONG TERM GOAL #2   Title FOTO < or = to 53% limited    Baseline 36% limitation    Status Achieved      PT LONG TERM GOAL #3   Title knee ROM 120-0 deg for improved gait and function    Baseline 3-120    Status Partially Met      PT LONG TERM GOAL #4   Title able to ambulate in community with 1/10 pain at most due to improved knee strength    Status Achieved      PT LONG TERM GOAL #5   Title Lt knee and hip strength 5/5 for ability to walk outside, stairs, and perform transfers without compensations    Baseline --    Status Achieved  Plan - 11/11/19 1559    Clinical Impression Statement Pt will D/C to HEP today. Pt with improved and functional Lt hip and knee strength and Lt knee A/ROM and denies any limitations at this time.  Pt has HEP and will continue with this after D/C.  Pt has edema appropriate for 3 months s/p TKA.  Pt will follow-up with MD this week.    PT Next Visit Plan D/C PT to HEP    PT Home Exercise Plan [id number]    Consulted and Agree with Plan of Care Patient           Patient will benefit from skilled  therapeutic intervention in order to improve the following deficits and impairments:     Visit Diagnosis: Left knee pain, unspecified chronicity  Difficulty in walking, not elsewhere classified  Stiffness of left knee, not elsewhere classified  Muscle weakness (generalized)     Problem List Patient Active Problem List   Diagnosis Date Noted  . S/P revision of total knee, left 08/15/2019  . Preop cardiovascular exam 08/13/2019  . Osteoarthritis of ankle, right 01/29/2018  . Disorder of tendon of posterior tibial muscle 07/06/2017  . Sleep related headaches 09/07/2016  . Nightmares REM-sleep type 09/07/2016  . RLS (restless legs syndrome) 09/07/2016  . Snoring 09/07/2016  . Excessive daytime sleepiness 09/07/2016  . Migraine 07/28/2016  . Solitary pulmonary nodule 06/08/2016  . S/P knee replacement 05/05/2015  . Former smoker 07/10/2014  . Shortness of breath 06/07/2014  . Chest pain 06/07/2014  . Osteoarthritis 05/16/2014  . Alcoholic fatty liver 91/66/0600  . Chronic post-traumatic stress disorder (PTSD) 05/02/2014  . Sleep disorder 05/02/2014  . Family history of alcoholism 05/02/2014  . Macrocytosis 04/19/2014  . Fatigue 08/15/2013  . Thyroid nodule 09/17/2012  . Hypothyroid 07/21/2010  . Type II diabetes mellitus, well controlled ([place]) 06/23/2010  . Hypertension associated with diabetes ([place]) 06/23/2010  . Alcohol use disorder, severe, in sustained remission ([place]) 06/23/2010  . Hyperlipidemia associated with type 2 diabetes mellitus ([place]) 06/23/2010  . Morbid obesity ([place]) 06/23/2010  . Chronic depression 06/23/2010  . History of colonic polyps 04/12/2010   PHYSICAL THERAPY DISCHARGE SUMMARY  Visits from Start of Care: 27  Current functional level related to goals / functional outcomes: See above for current status.     Remaining deficits: No functional deficits remain.     Education / Equipment: HEP Plan: Patient agrees to discharge.  Patient goals  were met. Patient is being discharged due to meeting the stated rehab goals.  ?????         Sigurd Sos, PT 11/11/19 4:08 PM  [facility] [address], [place], [place], [address] Phone: [phone]   Fax:  [phone]  Name: Holy B[place] MRN: [id number] Date of Birth: May 30, 1955

## 2019-11-18 ENCOUNTER — Encounter: Payer: Medicare Other | Admitting: Physical Therapy

## 2019-12-03 ENCOUNTER — Encounter: Payer: Self-pay | Admitting: Family Medicine

## 2019-12-04 ENCOUNTER — Other Ambulatory Visit: Payer: Self-pay | Admitting: Family Medicine

## 2019-12-04 ENCOUNTER — Encounter: Payer: Self-pay | Admitting: Family Medicine

## 2019-12-04 LAB — TSH: TSH: 0.1 — AB (ref 0.41–5.90)

## 2019-12-04 LAB — BASIC METABOLIC PANEL
BUN: 13 (ref 4–21)
CO2: 23 — AB (ref 13–22)
Chloride: 104 (ref 99–108)
Creatinine: 0.9 (ref 0.5–1.1)
Glucose: 86
Potassium: 4.9 (ref 3.4–5.3)
Sodium: 139 (ref 137–147)

## 2019-12-04 LAB — COMPREHENSIVE METABOLIC PANEL
Albumin: 3.7 (ref 3.5–5.0)
Calcium: 9.8 (ref 8.7–10.7)
GFR calc Af Amer: 83
GFR calc non Af Amer: 72
Globulin: 2.4

## 2019-12-04 LAB — CBC AND DIFFERENTIAL
HCT: 42 (ref 36–46)
Hemoglobin: 14.5 (ref 12.0–16.0)
Neutrophils Absolute: 2
Platelets: 220 (ref 150–399)
WBC: 5.3

## 2019-12-04 LAB — CBC: RBC: 4.5 (ref 3.87–5.11)

## 2019-12-04 LAB — HEMOGLOBIN A1C: Hemoglobin A1C: 6.1

## 2019-12-05 LAB — COMPREHENSIVE METABOLIC PANEL
ALT: 11 IU/L (ref 0–32)
AST: 15 IU/L (ref 0–40)
Albumin/Globulin Ratio: 1.5 (ref 1.2–2.2)
Albumin: 3.7 g/dL — ABNORMAL LOW (ref 3.8–4.8)
Alkaline Phosphatase: 80 IU/L (ref 44–121)
BUN/Creatinine Ratio: 15 (ref 12–28)
BUN: 13 mg/dL (ref 8–27)
Bilirubin Total: 0.9 mg/dL (ref 0.0–1.2)
CO2: 23 mmol/L (ref 20–29)
Calcium: 9.8 mg/dL (ref 8.7–10.3)
Chloride: 104 mmol/L (ref 96–106)
Creatinine, Ser: 0.85 mg/dL (ref 0.57–1.00)
GFR calc Af Amer: 83 mL/min/{1.73_m2} (ref >59)
GFR calc non Af Amer: 72 mL/min/{1.73_m2} (ref >59)
Globulin, Total: 2.4 g/dL (ref 1.5–4.5)
Glucose: 86 mg/dL (ref 65–99)
Potassium: 4.9 mmol/L (ref 3.5–5.2)
Sodium: 139 mmol/L (ref 134–144)
Total Protein: 6.1 g/dL (ref 6.0–8.5)

## 2019-12-05 LAB — CBC WITH DIFFERENTIAL/PLATELET
Basophils Absolute: 0 10*3/uL (ref 0.0–0.2)
Basos: 1 %
EOS (ABSOLUTE): 0.1 10*3/uL (ref 0.0–0.4)
Eos: 2 %
Hematocrit: 42.3 % (ref 34.0–46.6)
Hemoglobin: 14.5 g/dL (ref 11.1–15.9)
Immature Grans (Abs): 0 10*3/uL (ref 0.0–0.1)
Immature Granulocytes: 0 %
Lymphocytes Absolute: 2.3 10*3/uL (ref 0.7–3.1)
Lymphs: 42 %
MCH: 32.2 pg (ref 26.6–33.0)
MCHC: 34.3 g/dL (ref 31.5–35.7)
MCV: 94 fL (ref 79–97)
Monocytes Absolute: 0.6 10*3/uL (ref 0.1–0.9)
Monocytes: 12 %
Neutrophils Absolute: 2.3 10*3/uL (ref 1.4–7.0)
Neutrophils: 43 %
Platelets: 220 10*3/uL (ref 150–450)
RBC: 4.5 x10E6/uL (ref 3.77–5.28)
RDW: 13.2 % (ref 11.7–15.4)
WBC: 5.3 10*3/uL (ref 3.4–10.8)

## 2019-12-05 LAB — TSH: TSH: 0.101 u[IU]/mL — ABNORMAL LOW (ref 0.450–4.500)

## 2019-12-05 LAB — HGB A1C W/O EAG: Hgb A1c MFr Bld: 6.1 % — ABNORMAL HIGH (ref 4.8–5.6)

## 2019-12-05 NOTE — Patient Instructions (Addendum)
Health Maintenance Due  Topic Date Due   DEXA SCAN   Schedule your bone density test at check out desk. You may also call directly to X-ray at [phone] to schedule an appointment that is convenient for you.  - located [address] across the street from [facility] - in the basement - you do need an appointment for the bone density tests.    Never done   INFLUENZA VACCINE In office flu shot today high dose 09/22/2019    thyroid currently overtreated- we will reduce levothyroxine to 75 mcg and recheck next visit.   Team please print labs I ordered from today for her to get done at [facility] a week prior

## 2019-12-05 NOTE — Progress Notes (Signed)
Phone [phone] In person visit   Subjective:   [name] [name] is a 64 y.o. year old very pleasant female patient who presents for/with See problem oriented charting Chief Complaint  Patient presents with  . Diabetes  . Hypothyroidism   This visit occurred during the SARS-CoV-2 public health emergency.  Safety protocols were in place, including screening questions prior to the visit, additional usage of staff PPE, and extensive cleaning of exam room while observing appropriate contact time as indicated for disinfecting solutions.   Past Medical History-  Patient Active Problem List   Diagnosis Date Noted  . Solitary pulmonary nodule 06/08/2016    Priority: High  . Alcoholic fatty liver 93/81/0175    Priority: High  . Type II diabetes mellitus, well controlled ([place]) 06/23/2010    Priority: High  . Alcohol use disorder, severe, in sustained remission ([place]) 06/23/2010    Priority: High  . Morbid obesity ([place]) 06/23/2010    Priority: High  . Chronic depression 06/23/2010    Priority: High  . Migraine 07/28/2016    Priority: Medium  . Hypothyroid 07/21/2010    Priority: Medium  . Hypertension associated with diabetes ([place]) 06/23/2010    Priority: Medium  . Hyperlipidemia associated with type 2 diabetes mellitus ([place]) 06/23/2010    Priority: Medium  . S/P knee replacement 05/05/2015    Priority: Low  . Former smoker 07/10/2014    Priority: Low  . Osteoarthritis 05/16/2014    Priority: Low  . Chronic post-traumatic stress disorder (PTSD) 05/02/2014    Priority: Low  . Sleep disorder 05/02/2014    Priority: Low  . Family history of alcoholism 05/02/2014    Priority: Low  . Macrocytosis 04/19/2014    Priority: Low  . Fatigue 08/15/2013    Priority: Low  . Thyroid nodule 09/17/2012    Priority: Low  . History of colonic polyps 04/12/2010    Priority: Low  . S/P revision of total knee, left 08/15/2019  . Preop cardiovascular exam 08/13/2019  . Osteoarthritis of  ankle, right 01/29/2018  . Disorder of tendon of posterior tibial muscle 07/06/2017  . Sleep related headaches 09/07/2016  . Nightmares REM-sleep type 09/07/2016  . RLS (restless legs syndrome) 09/07/2016  . Snoring 09/07/2016  . Excessive daytime sleepiness 09/07/2016  . Shortness of breath 06/07/2014  . Chest pain 06/07/2014    Medications- reviewed and updated Current Outpatient Medications  Medication Sig Dispense Refill  . albuterol (PROVENTIL HFA;VENTOLIN HFA) 108 (90 Base) MCG/ACT inhaler Inhale 2 puffs into the lungs every 6 (six) hours as needed for wheezing or shortness of breath. 1 Inhaler 6  . benazepril (LOTENSIN) 20 MG tablet Take 1 tablet (20 mg total) by mouth daily. 90 tablet 3  . blood glucose meter kit and supplies KIT Dispense based on patient and insurance preference. Use up to four times daily as directed. DX E11.9 1 each 0  . buPROPion (WELLBUTRIN XL) 150 MG 24 hr tablet Take 1 tablet (150 mg total) by mouth daily. 90 tablet 3  . escitalopram (LEXAPRO) 20 MG tablet Take 1 tablet (20 mg total) by mouth daily. 90 tablet 3  . glimepiride (AMARYL) 4 MG tablet Take 1 tablet (4 mg total) by mouth daily before breakfast. 90 tablet 3  . glucose blood (ONETOUCH VERIO) test strip USE TO CHECK BLOOD SUGAR DAILY AND PRN 100 each 12  . HYDROcodone-acetaminophen (NORCO) 7.5-325 MG tablet Take 1-2 tablets by mouth every 4 (four) hours as needed for moderate pain. 60 tablet 0  .  levothyroxine (SYNTHROID) 88 MCG tablet Take 1 tablet (88 mcg total) by mouth daily. 90 tablet 3  . metFORMIN (GLUCOPHAGE XR) 500 MG 24 hr tablet Take 2 tablets (1,000 mg total) by mouth in the morning and at bedtime. 360 tablet 3  . simvastatin (ZOCOR) 20 MG tablet Take 1 tablet (20 mg total) by mouth at bedtime. TAKE 1 TABLET BY MOUTH EVERYDAY AT BEDTIME 90 tablet 3  . tiZANidine (ZANAFLEX) 4 MG capsule Take 1 capsule (4 mg total) by mouth 3 (three) times daily as needed for muscle spasms. 40 capsule 0  .  traZODone (DESYREL) 50 MG tablet Take 1 tablet (50 mg total) by mouth at bedtime. 90 tablet 3  . docusate sodium (COLACE) 100 MG capsule Take 1 capsule (100 mg total) by mouth 2 (two) times daily. 28 capsule 0  . ferrous sulfate (FERROUSUL) 325 (65 FE) MG tablet Take 1 tablet (325 mg total) by mouth 3 (three) times daily with meals for 14 days. 42 tablet 0  . polyethylene glycol (MIRALAX / GLYCOLAX) 17 g packet Take 17 g by mouth 2 (two) times daily. 28 packet 0   No current facility-administered medications for this visit.     Objective:  BP 128/72   Pulse (!) 59   Temp 98 F (36.7 C) (Temporal)   Resp 18   Ht '5\' 6"'  (1.676 m)   Wt 242 lb (109.8 kg)   SpO2 98%   BMI 39.06 kg/m  Gen: NAD, resting comfortably CV: RRR no murmurs rubs or gallops Lungs: CTAB no crackles, wheeze, rhonchi Ext: no edema Skin: warm, dry    Assessment and Plan   #pulmonary nodule- has follow up with Dr. Ander Slade 12/30/2019 - had been stable in 2019 at 18 months with plan for scans every 2 years until 5 full years of stability noted  #alcohol free over 5 years!!!!!  # Depression S: Medication:Lexapro 20Mg, Wellbutrin 150Mg -still with some anxiety A/P: depression controlled but still with some anxiety- continue current meds for now- finds it tolerable -has been doing ok without trazodone for sleep- does take tizanidine for muscle spasms and helps sleep well with this  #hypothyroidism S: compliant On thyroid medication-levothryoxine 84mg Lab Results  Component Value Date   TSH 0.101 (L) 12/04/2019   A/P: thyroid currently overtreated- we will reduce levothyroxine to 75 mcg and recheck next visit.    # Diabetes S: Medication: Metformin 1000Mg XR BID , Amaryl 4Mg CBGs- variable. 2 lows unde r70s- missed meal- encouraged her to avoid missing Exercise and diet- has cut out chocolate milk and cut down on chocolate- once every 2-3 days instead of 2-3x a day. Cut back on bread Lab Results  Component  Value Date   HGBA1C 6.1 (H) 12/04/2019   HGBA1C 6.1 12/04/2019   HGBA1C 6.7 (H) 08/07/2019   A/P: glad improved- continue to work on lifestyle and recheck next visit  #hyperlipidemia S: Medication:simvastatin 20Mg Lab Results  Component Value Date   CHOL 120 01/25/2019   HDL 60 01/25/2019   LDLCALC 46 01/25/2019   LDLDIRECT 158.6 02/26/2014   TRIG 56 01/25/2019   CHOLHDL 2.0 01/25/2019   A/P: excellent last visit- check again next labs.   #hypertension S: medication: benazepril 20Mg BP Readings from Last 3 Encounters:  12/09/19 128/72  08/17/19 (!) 175/68  08/14/19 140/77  A/P: Stable. Continue current medications.   # on iron previously but no anemia despite stopping this  #Knee revision earlier this year on June 24th- doing  well.   Recommended follow up: Return in about 4 months (around 04/10/2020) for welcome to medicare exam (40 minutes). Future Appointments  Date Time Provider [place]  12/30/2019 10:40 AM GI-315 CT 1 GI-315CT [facility]   Lab/Order associations:   ICD-10-CM   1. Hypertension associated with diabetes ([place])  E11.59    I15.2   2. Hyperlipidemia associated with type 2 diabetes mellitus ([place])  E11.69    E78.5   3. Hypothyroidism, unspecified type  E03.9 levothyroxine (SYNTHROID) 75 MCG tablet  4. Type II diabetes mellitus, well controlled ([place])  E11.9   5. Chronic depression  F32.A   6. Solitary pulmonary nodule  R91.1    Meds ordered this encounter  Medications  . levothyroxine (SYNTHROID) 75 MCG tablet    Sig: Take 1 tablet (75 mcg total) by mouth daily.    Dispense:  90 tablet    Refill:  3   Return precautions advised.  [name], MD

## 2019-12-09 ENCOUNTER — Ambulatory Visit (INDEPENDENT_AMBULATORY_CARE_PROVIDER_SITE_OTHER): Payer: Medicare Other | Admitting: Family Medicine

## 2019-12-09 ENCOUNTER — Encounter: Payer: Self-pay | Admitting: Family Medicine

## 2019-12-09 ENCOUNTER — Other Ambulatory Visit: Payer: Self-pay

## 2019-12-09 VITALS — BP 128/72 | HR 59 | Temp 98.0°F | Resp 18 | Ht 66.0 in | Wt 242.0 lb

## 2019-12-09 DIAGNOSIS — E119 Type 2 diabetes mellitus without complications: Secondary | ICD-10-CM

## 2019-12-09 DIAGNOSIS — Z780 Asymptomatic menopausal state: Secondary | ICD-10-CM

## 2019-12-09 DIAGNOSIS — E039 Hypothyroidism, unspecified: Secondary | ICD-10-CM

## 2019-12-09 DIAGNOSIS — E1169 Type 2 diabetes mellitus with other specified complication: Secondary | ICD-10-CM | POA: Diagnosis not present

## 2019-12-09 DIAGNOSIS — Z23 Encounter for immunization: Secondary | ICD-10-CM

## 2019-12-09 DIAGNOSIS — E1159 Type 2 diabetes mellitus with other circulatory complications: Secondary | ICD-10-CM | POA: Diagnosis not present

## 2019-12-09 DIAGNOSIS — I152 Hypertension secondary to endocrine disorders: Secondary | ICD-10-CM

## 2019-12-09 DIAGNOSIS — F32A Depression, unspecified: Secondary | ICD-10-CM | POA: Diagnosis not present

## 2019-12-09 DIAGNOSIS — E785 Hyperlipidemia, unspecified: Secondary | ICD-10-CM

## 2019-12-09 DIAGNOSIS — R911 Solitary pulmonary nodule: Secondary | ICD-10-CM

## 2019-12-09 MED ORDER — LEVOTHYROXINE SODIUM 75 MCG PO TABS: 75.0000 ug | ORAL_TABLET | Freq: Every day | ORAL | 3 refills | Status: DC

## 2019-12-11 ENCOUNTER — Other Ambulatory Visit: Payer: Self-pay

## 2019-12-11 ENCOUNTER — Ambulatory Visit (INDEPENDENT_AMBULATORY_CARE_PROVIDER_SITE_OTHER)
Admission: RE | Admit: 2019-12-11 | Discharge: 2019-12-11 | Disposition: A | Discharge status: Home / Self Care | Payer: Medicare Other | Source: Ambulatory Visit | Attending: Family Medicine | Admitting: Family Medicine

## 2019-12-11 DIAGNOSIS — Z780 Asymptomatic menopausal state: Secondary | ICD-10-CM | POA: Diagnosis not present

## 2019-12-18 ENCOUNTER — Inpatient Hospital Stay: Admission: RE | Admit: 2019-12-18 | Payer: No Typology Code available for payment source | Source: Ambulatory Visit

## 2019-12-30 ENCOUNTER — Ambulatory Visit: Payer: Medicare Other | Attending: Internal Medicine

## 2019-12-30 ENCOUNTER — Other Ambulatory Visit: Payer: Self-pay

## 2019-12-30 ENCOUNTER — Ambulatory Visit
Admission: RE | Admit: 2019-12-30 | Discharge: 2019-12-30 | Disposition: A | Discharge status: Home / Self Care | Payer: Medicare Other | Source: Ambulatory Visit | Attending: Pulmonary Disease | Admitting: Pulmonary Disease

## 2019-12-30 DIAGNOSIS — R918 Other nonspecific abnormal finding of lung field: Secondary | ICD-10-CM

## 2019-12-30 DIAGNOSIS — Z23 Encounter for immunization: Secondary | ICD-10-CM

## 2019-12-30 NOTE — Progress Notes (Signed)
   Covid-19 Vaccination Clinic  Name:  [name] [name]    MRN: [id number] DOB: 07-27-1955  12/30/2019  [name] was observed post Covid-19 immunization for 15 minutes without incident. She was provided with Vaccine Information Sheet and instruction to access the V-Safe system.   [name] was instructed to call 911 with any severe reactions post vaccine: Marland Kitchen Difficulty breathing  . Swelling of face and throat  . A fast heartbeat  . A bad rash all over body  . Dizziness and weakness

## 2020-01-27 LAB — HM DIABETES EYE EXAM

## 2020-03-11 ENCOUNTER — Telehealth: Payer: Self-pay

## 2020-03-11 NOTE — Telephone Encounter (Signed)
   [facility] Pre-operative Risk Assessment    HEARTCARE STAFF: - Please ensure there is not already an duplicate clearance open for this procedure. - Under Visit Info/Reason for Call, type in Other and utilize the format Clearance MM/DD/YY or Clearance TBD. Do not use dashes or single digits. - If request is for dental extraction, please clarify the # of teeth to be extracted.  Request for surgical clearance:  1. What type of surgery is being performed? Bilateral hammertoe correction   2. When is this surgery scheduled? TBD   3. What type of clearance is required (medical clearance vs. Pharmacy clearance to hold med vs. Both)? Medical  4. Are there any medications that need to be held prior to surgery and how long? N/A   5. Practice name and name of physician performing surgery? [facility] - Dr.John Hewitt    6. What is the office phone number? [phone]   7.   What is the office fax number? [phone]  8.   Anesthesia type (None, local, MAC, general) ? choice   [name] [name] 03/11/2020, 5:16 PM  _________________________________________________________________   (provider comments below)

## 2020-03-12 NOTE — Telephone Encounter (Signed)
Called patient. Left VM requesting call back.   Loel Dubonnet, NP 03/12/2020 @ [phone]

## 2020-03-12 NOTE — Telephone Encounter (Signed)
   Primary Cardiologist: Minus Breeding, MD  Chart reviewed as part of pre-operative protocol coverage. Patient was contacted 03/12/2020 in reference to pre-operative risk assessment for pending surgery as outlined below.  [name] [name] was last seen on 08/14/2019 by Dr. Percival Spanish.  Since that day, [name] [name] has done well. She reports no chest pain, pressure, tightness. She reports no shortness of breath at rest and tells me her dyspnea with more than usual activity is stable at her baseline.  Therefore, based on ACC/AHA guidelines, the patient would be at acceptable risk for the planned procedure without further cardiovascular testing.   She does take Aspirin 81mg  daily and if deemed necessary by surgeon, would be permissible to hold 5-7 days prior to procedure.  The patient was advised that if she develops new symptoms prior to surgery to contact our office to arrange for a follow-up visit, and she verbalized understanding.  I will route this recommendation to the requesting party via Epic fax function and remove from pre-op pool. Please call with questions.  Loel Dubonnet, NP 03/12/2020, 3:11 PM

## 2020-03-16 ENCOUNTER — Other Ambulatory Visit (HOSPITAL_COMMUNITY): Payer: Self-pay | Admitting: Orthopedic Surgery

## 2020-03-27 ENCOUNTER — Encounter (HOSPITAL_BASED_OUTPATIENT_CLINIC_OR_DEPARTMENT_OTHER): Payer: Self-pay | Admitting: Orthopedic Surgery

## 2020-03-27 ENCOUNTER — Other Ambulatory Visit: Payer: Self-pay

## 2020-03-30 ENCOUNTER — Encounter (HOSPITAL_BASED_OUTPATIENT_CLINIC_OR_DEPARTMENT_OTHER)
Admission: RE | Admit: 2020-03-30 | Discharge: 2020-03-30 | Disposition: A | Discharge status: Home / Self Care | Payer: Medicare Other | Source: Ambulatory Visit | Attending: Orthopedic Surgery | Admitting: Orthopedic Surgery

## 2020-03-30 ENCOUNTER — Other Ambulatory Visit (HOSPITAL_COMMUNITY)
Admission: RE | Admit: 2020-03-30 | Discharge: 2020-03-30 | Disposition: A | Discharge status: Home / Self Care | Payer: Medicare Other | Source: Ambulatory Visit | Attending: Orthopedic Surgery | Admitting: Orthopedic Surgery

## 2020-03-30 DIAGNOSIS — Z01812 Encounter for preprocedural laboratory examination: Secondary | ICD-10-CM | POA: Insufficient documentation

## 2020-03-30 DIAGNOSIS — Z20822 Contact with and (suspected) exposure to covid-19: Secondary | ICD-10-CM | POA: Insufficient documentation

## 2020-03-30 LAB — BASIC METABOLIC PANEL
Anion gap: 9 (ref 5–15)
BUN: 12 mg/dL (ref 8–23)
CO2: 24 mmol/L (ref 22–32)
Calcium: 9.4 mg/dL (ref 8.9–10.3)
Chloride: 106 mmol/L (ref 98–111)
Creatinine, Ser: 0.94 mg/dL (ref 0.44–1.00)
GFR, Estimated: >60 mL/min (ref >60)
Glucose, Bld: 134 mg/dL — ABNORMAL HIGH (ref 70–99)
Potassium: 4.9 mmol/L (ref 3.5–5.1)
Sodium: 139 mmol/L (ref 135–145)

## 2020-03-30 LAB — SARS CORONAVIRUS 2 (TAT 6-24 HRS): SARS Coronavirus 2: NEGATIVE

## 2020-03-30 NOTE — Progress Notes (Signed)

## 2020-04-02 ENCOUNTER — Ambulatory Visit (HOSPITAL_BASED_OUTPATIENT_CLINIC_OR_DEPARTMENT_OTHER)
Admission: RE | Admit: 2020-04-02 | Discharge: 2020-04-02 | Disposition: A | Discharge status: Home / Self Care | Payer: Medicare Other | Attending: Orthopedic Surgery | Admitting: Orthopedic Surgery

## 2020-04-02 ENCOUNTER — Ambulatory Visit (HOSPITAL_BASED_OUTPATIENT_CLINIC_OR_DEPARTMENT_OTHER): Payer: Medicare Other | Admitting: Certified Registered"

## 2020-04-02 ENCOUNTER — Encounter (HOSPITAL_BASED_OUTPATIENT_CLINIC_OR_DEPARTMENT_OTHER): Payer: Self-pay | Admitting: Orthopedic Surgery

## 2020-04-02 ENCOUNTER — Encounter (HOSPITAL_BASED_OUTPATIENT_CLINIC_OR_DEPARTMENT_OTHER)
Admission: RE | Disposition: A | Discharge status: Home / Self Care | Payer: Self-pay | Source: Home / Self Care | Attending: Orthopedic Surgery

## 2020-04-02 ENCOUNTER — Other Ambulatory Visit: Payer: Self-pay

## 2020-04-02 DIAGNOSIS — M2042 Other hammer toe(s) (acquired), left foot: Secondary | ICD-10-CM | POA: Diagnosis not present

## 2020-04-02 DIAGNOSIS — Z7984 Long term (current) use of oral hypoglycemic drugs: Secondary | ICD-10-CM | POA: Diagnosis not present

## 2020-04-02 DIAGNOSIS — M7741 Metatarsalgia, right foot: Secondary | ICD-10-CM | POA: Insufficient documentation

## 2020-04-02 DIAGNOSIS — M2041 Other hammer toe(s) (acquired), right foot: Secondary | ICD-10-CM | POA: Diagnosis not present

## 2020-04-02 DIAGNOSIS — Z881 Allergy status to other antibiotic agents status: Secondary | ICD-10-CM | POA: Diagnosis not present

## 2020-04-02 DIAGNOSIS — M7742 Metatarsalgia, left foot: Secondary | ICD-10-CM | POA: Diagnosis not present

## 2020-04-02 DIAGNOSIS — E1142 Type 2 diabetes mellitus with diabetic polyneuropathy: Secondary | ICD-10-CM | POA: Diagnosis not present

## 2020-04-02 DIAGNOSIS — Z79899 Other long term (current) drug therapy: Secondary | ICD-10-CM | POA: Insufficient documentation

## 2020-04-02 DIAGNOSIS — Z7982 Long term (current) use of aspirin: Secondary | ICD-10-CM | POA: Diagnosis not present

## 2020-04-02 DIAGNOSIS — Z87891 Personal history of nicotine dependence: Secondary | ICD-10-CM | POA: Diagnosis not present

## 2020-04-02 DIAGNOSIS — Z7951 Long term (current) use of inhaled steroids: Secondary | ICD-10-CM | POA: Diagnosis not present

## 2020-04-02 DIAGNOSIS — Z888 Allergy status to other drugs, medicaments and biological substances status: Secondary | ICD-10-CM | POA: Insufficient documentation

## 2020-04-02 HISTORY — DX: Dyspnea, unspecified: R06.00

## 2020-04-02 HISTORY — PX: HAMMERTOE RECONSTRUCTION WITH WEIL OSTEOTOMY: SHX5631

## 2020-04-02 LAB — GLUCOSE, CAPILLARY
Glucose-Capillary: 131 mg/dL — ABNORMAL HIGH (ref 70–99)
Glucose-Capillary: 130 mg/dL — ABNORMAL HIGH (ref 70–99)

## 2020-04-02 SURGERY — HAMMERTOE RECONSTRUCTION WITH WEIL OSTEOTOMY
Anesthesia: General | Site: Foot | Laterality: Bilateral

## 2020-04-02 MED ORDER — OXYCODONE HCL 5 MG PO TABS: 5.0000 mg | ORAL_TABLET | Freq: Four times a day (QID) | ORAL | 0 refills | Status: AC | PRN

## 2020-04-02 MED ORDER — MEPERIDINE HCL 25 MG/ML IJ SOLN: 6.2500 mg | INTRAMUSCULAR | Status: DC | PRN

## 2020-04-02 MED ORDER — KETOROLAC TROMETHAMINE 15 MG/ML IJ SOLN: 15.0000 mg | Freq: Once | INTRAMUSCULAR | Status: DC

## 2020-04-02 MED ORDER — 0.9 % SODIUM CHLORIDE (POUR BTL) OPTIME
TOPICAL | Status: DC | PRN
  Administered 2020-04-02: 200 mL

## 2020-04-02 MED ORDER — FENTANYL CITRATE (PF) 100 MCG/2ML IJ SOLN
INTRAMUSCULAR | Status: AC
  Filled 2020-04-02: qty 2

## 2020-04-02 MED ORDER — PROPOFOL 10 MG/ML IV BOLUS
INTRAVENOUS | Status: AC
  Filled 2020-04-02: qty 20

## 2020-04-02 MED ORDER — VANCOMYCIN HCL 500 MG IV SOLR
INTRAVENOUS | Status: DC | PRN
  Administered 2020-04-02: 500 mg

## 2020-04-02 MED ORDER — ONDANSETRON HCL 4 MG/2ML IJ SOLN
INTRAMUSCULAR | Status: DC | PRN
  Administered 2020-04-02: 4 mg via INTRAVENOUS

## 2020-04-02 MED ORDER — MIDAZOLAM HCL 2 MG/2ML IJ SOLN
INTRAMUSCULAR | Status: AC
  Filled 2020-04-02: qty 2

## 2020-04-02 MED ORDER — BUPIVACAINE-EPINEPHRINE 0.5% -1:200000 IJ SOLN
INTRAMUSCULAR | Status: DC | PRN
  Administered 2020-04-02: 20 mL

## 2020-04-02 MED ORDER — HYDROMORPHONE HCL 1 MG/ML IJ SOLN: 0.2500 mg | INTRAMUSCULAR | Status: DC | PRN

## 2020-04-02 MED ORDER — ONDANSETRON HCL 4 MG/2ML IJ SOLN
INTRAMUSCULAR | Status: AC
  Filled 2020-04-02: qty 2

## 2020-04-02 MED ORDER — SODIUM CHLORIDE 0.9 % IV SOLN: INTRAVENOUS | Status: DC

## 2020-04-02 MED ORDER — EPHEDRINE 5 MG/ML INJ
INTRAVENOUS | Status: AC
  Filled 2020-04-02: qty 10

## 2020-04-02 MED ORDER — MIDAZOLAM HCL 5 MG/5ML IJ SOLN
INTRAMUSCULAR | Status: DC | PRN
  Administered 2020-04-02: 2 mg via INTRAVENOUS

## 2020-04-02 MED ORDER — CEFAZOLIN SODIUM-DEXTROSE 2-4 GM/100ML-% IV SOLN
2.0000 g | INTRAVENOUS | Status: AC
  Administered 2020-04-02: 2 g via INTRAVENOUS

## 2020-04-02 MED ORDER — LIDOCAINE 2% (20 MG/ML) 5 ML SYRINGE
INTRAMUSCULAR | Status: DC | PRN
  Administered 2020-04-02: 60 mg via INTRAVENOUS

## 2020-04-02 MED ORDER — VANCOMYCIN HCL 500 MG IV SOLR
INTRAVENOUS | Status: AC
  Filled 2020-04-02: qty 500

## 2020-04-02 MED ORDER — PROPOFOL 10 MG/ML IV BOLUS
INTRAVENOUS | Status: DC | PRN
  Administered 2020-04-02: 30 mg via INTRAVENOUS
  Administered 2020-04-02: 20 mg via INTRAVENOUS
  Administered 2020-04-02: 150 mg via INTRAVENOUS

## 2020-04-02 MED ORDER — LACTATED RINGERS IV SOLN: INTRAVENOUS | Status: DC

## 2020-04-02 MED ORDER — ONDANSETRON HCL 4 MG/2ML IJ SOLN: 4.0000 mg | Freq: Once | INTRAMUSCULAR | Status: DC | PRN

## 2020-04-02 MED ORDER — PROPOFOL 500 MG/50ML IV EMUL
INTRAVENOUS | Status: DC | PRN
  Administered 2020-04-02: 25 ug/kg/min via INTRAVENOUS

## 2020-04-02 MED ORDER — DEXAMETHASONE SODIUM PHOSPHATE 10 MG/ML IJ SOLN
INTRAMUSCULAR | Status: AC
  Filled 2020-04-02: qty 1

## 2020-04-02 MED ORDER — FENTANYL CITRATE (PF) 100 MCG/2ML IJ SOLN
INTRAMUSCULAR | Status: DC | PRN
  Administered 2020-04-02 (×3): 25 ug via INTRAVENOUS
  Administered 2020-04-02: 50 ug via INTRAVENOUS
  Administered 2020-04-02 (×5): 25 ug via INTRAVENOUS

## 2020-04-02 MED ORDER — DEXAMETHASONE SODIUM PHOSPHATE 10 MG/ML IJ SOLN
INTRAMUSCULAR | Status: DC | PRN
  Administered 2020-04-02: 4 mg via INTRAVENOUS

## 2020-04-02 SURGICAL SUPPLY — 76 items
APL PRP STRL LF DISP 70% ISPRP (MISCELLANEOUS) ×2
BANDAGE ESMARK 6X9 LF (GAUZE/BANDAGES/DRESSINGS) IMPLANT
BIT DRILL CANN 2.4 (BIT) ×2
BIT DRILL CANN MAX VPC 2.4 (BIT) ×1 IMPLANT
BLADE AVERAGE 25X9 (BLADE) IMPLANT
BLADE LONG MED 25X9 (BLADE) ×2 IMPLANT
BLADE OSC/SAG .038X5.5 CUT EDG (BLADE) IMPLANT
BLADE SURG 15 STRL LF DISP TIS (BLADE) ×3 IMPLANT
BLADE SURG 15 STRL SS (BLADE) ×6
BNDG CMPR 9X4 STRL LF SNTH (GAUZE/BANDAGES/DRESSINGS) ×1
BNDG CMPR 9X6 STRL LF SNTH (GAUZE/BANDAGES/DRESSINGS)
BNDG COHESIVE 4X5 TAN STRL (GAUZE/BANDAGES/DRESSINGS) ×4 IMPLANT
BNDG CONFORM 2 STRL LF (GAUZE/BANDAGES/DRESSINGS) IMPLANT
BNDG CONFORM 3 STRL LF (GAUZE/BANDAGES/DRESSINGS) ×4 IMPLANT
BNDG ESMARK 4X9 LF (GAUZE/BANDAGES/DRESSINGS) ×2 IMPLANT
BNDG ESMARK 6X9 LF (GAUZE/BANDAGES/DRESSINGS)
CAP PIN PROTECTOR ORTHO WHT (CAP) IMPLANT
CHLORAPREP W/TINT 26 (MISCELLANEOUS) ×4 IMPLANT
COVER BACK TABLE 60X90IN (DRAPES) ×2 IMPLANT
COVER WAND RF STERILE (DRAPES) IMPLANT
CUFF TOURN SGL QUICK 34 (TOURNIQUET CUFF)
CUFF TOURN SGL QUICK 42 (TOURNIQUET CUFF) IMPLANT
CUFF TRNQT CYL 34X4.125X (TOURNIQUET CUFF) IMPLANT
DRAPE BILATERAL LIMB T (DRAPES) ×2 IMPLANT
DRAPE OEC MINIVIEW 54X84 (DRAPES) ×2 IMPLANT
DRAPE U-SHAPE 47X51 STRL (DRAPES) ×4 IMPLANT
DRSG MEPITEL 4X7.2 (GAUZE/BANDAGES/DRESSINGS) ×2 IMPLANT
DRSG PAD ABDOMINAL 8X10 ST (GAUZE/BANDAGES/DRESSINGS) ×4 IMPLANT
ELECT REM PT RETURN 9FT ADLT (ELECTROSURGICAL) ×2
ELECTRODE REM PT RTRN 9FT ADLT (ELECTROSURGICAL) ×1 IMPLANT
GAUZE SPONGE 4X4 12PLY STRL (GAUZE/BANDAGES/DRESSINGS) ×2 IMPLANT
GLOVE ECLIPSE 8.0 STRL XLNG CF (GLOVE) IMPLANT
GLOVE SRG 8 PF TXTR STRL LF DI (GLOVE) ×3 IMPLANT
GLOVE SURG ENC MOIS LTX SZ7 (GLOVE) ×2 IMPLANT
GLOVE SURG ENC MOIS LTX SZ8 (GLOVE) ×2 IMPLANT
GLOVE SURG SS PI 7.0 STRL IVOR (GLOVE) ×2 IMPLANT
GLOVE SURG UNDER POLY LF SZ7 (GLOVE) ×6 IMPLANT
GLOVE SURG UNDER POLY LF SZ7.5 (GLOVE) ×2 IMPLANT
GLOVE SURG UNDER POLY LF SZ8 (GLOVE) ×6
GOWN STRL REUS W/ TWL LRG LVL3 (GOWN DISPOSABLE) ×2 IMPLANT
GOWN STRL REUS W/ TWL XL LVL3 (GOWN DISPOSABLE) ×1 IMPLANT
GOWN STRL REUS W/TWL LRG LVL3 (GOWN DISPOSABLE) ×4
GOWN STRL REUS W/TWL XL LVL3 (GOWN DISPOSABLE) ×2
K-WIRE .054X4 (WIRE) IMPLANT
K-WIRE COCR 1.1X105 (WIRE) ×2
KWIRE COCR 1.1X105 (WIRE) ×1 IMPLANT
NEEDLE HYPO 22GX1.5 SAFETY (NEEDLE) IMPLANT
NS IRRIG 1000ML POUR BTL (IV SOLUTION) ×2 IMPLANT
PACK BASIN DAY SURGERY FS (CUSTOM PROCEDURE TRAY) ×2 IMPLANT
PAD CAST 4YDX4 CTTN HI CHSV (CAST SUPPLIES) ×2 IMPLANT
PADDING CAST ABS 4INX4YD NS (CAST SUPPLIES)
PADDING CAST ABS COTTON 4X4 ST (CAST SUPPLIES) IMPLANT
PADDING CAST COTTON 4X4 STRL (CAST SUPPLIES) ×4
PASSER SUT SWANSON 36MM LOOP (INSTRUMENTS) IMPLANT
PENCIL SMOKE EVACUATOR (MISCELLANEOUS) ×2 IMPLANT
SANITIZER HAND PURELL 535ML FO (MISCELLANEOUS) ×2 IMPLANT
SCREW HCS TWIST-OFF 2.0X12MM (Screw) ×8 IMPLANT
SCREW VCP 3.4X14 (Screw) ×4 IMPLANT
SCREW VPC 3.4X16 (Screw) ×4 IMPLANT
SHEET MEDIUM DRAPE 40X70 STRL (DRAPES) ×2 IMPLANT
SPONGE LAP 18X18 RF (DISPOSABLE) ×2 IMPLANT
STOCKINETTE 6  STRL (DRAPES) ×3
STOCKINETTE 6 STRL (DRAPES) ×3 IMPLANT
SUCTION FRAZIER HANDLE 10FR (MISCELLANEOUS) ×1
SUCTION TUBE FRAZIER 10FR DISP (MISCELLANEOUS) ×1 IMPLANT
SUT ETHILON 3 0 PS 1 (SUTURE) ×2 IMPLANT
SUT MNCRL AB 3-0 PS2 18 (SUTURE) ×2 IMPLANT
SUT VIC AB 2-0 SH 27 (SUTURE) ×2
SUT VIC AB 2-0 SH 27XBRD (SUTURE) ×1 IMPLANT
SUT VICRYL 0 UR6 27IN ABS (SUTURE) IMPLANT
SYR BULB EAR ULCER 3OZ GRN STR (SYRINGE) ×2 IMPLANT
SYR CONTROL 10ML LL (SYRINGE) ×2 IMPLANT
TOWEL GREEN STERILE FF (TOWEL DISPOSABLE) ×2 IMPLANT
TUBE CONNECTING 20X1/4 (TUBING) ×2 IMPLANT
UNDERPAD 30X36 HEAVY ABSORB (UNDERPADS AND DIAPERS) ×4 IMPLANT
YANKAUER SUCT BULB TIP NO VENT (SUCTIONS) IMPLANT

## 2020-04-02 NOTE — Discharge Instructions (Signed)
[name] Simmer, MD [facility]  Please read the following information regarding your care after surgery.  Medications  You only need a prescription for the narcotic pain medicine (ex. oxycodone, Percocet, Norco).  All of the other medicines listed below are available over the counter. X Aleve 2 pills twice a day for the first 3 days after surgery. X acetominophen (Tylenol) 650 mg every 4-6 hours as you need for minor to moderate pain X oxycodone as prescribed for severe pain  Narcotic pain medicine (ex. oxycodone, Percocet, Vicodin) will cause constipation.  To prevent this problem, take the following medicines while you are taking any pain medicine. X docusate sodium (Colace) 100 mg twice a day X senna (Senokot) 2 tablets twice a day  x To help prevent blood clots, take a baby aspirin (81 mg) twice a day for two weeks after surgery.  You should also get up every hour while you are awake to move around.    Weight Bearing ? Bear weight when you are able on your operated leg or foot. X Bear weight only on your operated foot in the post-op shoe. ? Do not bear any weight on the operated leg or foot.  Cast / Splint / Dressing X Keep your splint, cast or dressing clean and dry.  Don't put anything (coat hanger, pencil, etc) down inside of it.  If it gets damp, use a hair dryer on the cool setting to dry it.  If it gets soaked, call the office to schedule an appointment for a cast change. ? Remove your dressing 3 days after surgery and cover the incisions with dry dressings.    After your dressing, cast or splint is removed; you may shower, but do not soak or scrub the wound.  Allow the water to run over it, and then gently pat it dry.  Swelling It is normal for you to have swelling where you had surgery.  To reduce swelling and pain, keep your toes above your nose for at least 3 days after surgery.  It may be necessary to keep your foot or leg elevated for several weeks.  If it hurts, it should be  elevated.  Follow Up Call my office at [phone] when you are discharged from the hospital or surgery center to schedule an appointment to be seen two weeks after surgery.  Call my office at [phone] if you develop a fever >101.5 F, nausea, vomiting, bleeding from the surgical site or severe pain.     Post Anesthesia Home Care Instructions  Activity: Get plenty of rest for the remainder of the day. A responsible individual must stay with you for 24 hours following the procedure.  For the next 24 hours, DO NOT: -Drive a car -Paediatric nurse -Drink alcoholic beverages -Take any medication unless instructed by your physician -Make any legal decisions or sign important papers.  Meals: Start with liquid foods such as gelatin or soup. Progress to regular foods as tolerated. Avoid greasy, spicy, heavy foods. If nausea and/or vomiting occur, drink only clear liquids until the nausea and/or vomiting subsides. Call your physician if vomiting continues.  Special Instructions/Symptoms: Your throat may feel dry or sore from the anesthesia or the breathing tube placed in your throat during surgery. If this causes discomfort, gargle with warm salt water. The discomfort should disappear within 24 hours.  If you had a scopolamine patch placed behind your ear for the management of post- operative nausea and/or vomiting:  1. The medication in the patch is  effective for 72 hours, after which it should be removed.  Wrap patch in a tissue and discard in the trash. Wash hands thoroughly with soap and water. 2. You may remove the patch earlier than 72 hours if you experience unpleasant side effects which may include dry mouth, dizziness or visual disturbances. 3. Avoid touching the patch. Wash your hands with soap and water after contact with the patch.

## 2020-04-02 NOTE — Transfer of Care (Signed)
Immediate Anesthesia Transfer of Care Note  Patient: [name] [name]  Procedure(s) Performed: Bilateral 2-3 Weil and hammertoe corrections (Bilateral Foot)  Patient Location: PACU  Anesthesia Type:General  Level of Consciousness: awake, alert  and oriented  Airway & Oxygen Therapy: Patient Spontanous Breathing and Patient connected to face mask oxygen  Post-op Assessment: Report given to RN and Post -op Vital signs reviewed and stable  Post vital signs: Reviewed and stable  Last Vitals:  Vitals Value Taken Time  BP 137/75 04/02/20 1408  Temp    Pulse 91 04/02/20 1410  Resp 15 04/02/20 1410  SpO2 100 % 04/02/20 1410  Vitals shown include unvalidated device data.  Last Pain:  Vitals:   04/02/20 1051  TempSrc: Oral  PainSc: 0-No pain      Patients Stated Pain Goal: 5 (67/51/98 2429)  Complications: No complications documented.

## 2020-04-02 NOTE — Anesthesia Preprocedure Evaluation (Signed)
Anesthesia Evaluation  Patient identified by MRN, date of birth, ID band Patient awake    Reviewed: Allergy & Precautions, NPO status , Patient's Chart, lab work & pertinent test results  Airway Mallampati: II       Dental  (+) Dental Advisory Given, Teeth Intact   Pulmonary shortness of breath, sleep apnea , former smoker,    Pulmonary exam normal        Cardiovascular hypertension, Pt. on medications Normal cardiovascular exam     Neuro/Psych  Headaches, PSYCHIATRIC DISORDERS Anxiety Depression  Neuromuscular disease    GI/Hepatic Neg liver ROS,   Endo/Other  diabetes, Type 2, Oral Hypoglycemic AgentsHypothyroidism   Renal/GU negative Renal ROS  negative genitourinary   Musculoskeletal  (+) Arthritis ,   Abdominal (+) + obese,   Peds  Hematology negative hematology ROS (+) anemia ,   Anesthesia Other Findings   Reproductive/Obstetrics                             Anesthesia Physical  Anesthesia Plan  ASA: II  Anesthesia Plan: General   Post-op Pain Management:    Induction: Intravenous  PONV Risk Score and Plan: 4 or greater and Propofol infusion, Treatment may vary due to age or medical condition, Midazolam and Ondansetron  Airway Management Planned: LMA  Additional Equipment: None  Intra-op Plan:   Post-operative Plan: Extubation in OR  Informed Consent: I have reviewed the patients History and Physical, chart, labs and discussed the procedure including the risks, benefits and alternatives for the proposed anesthesia with the patient or authorized representative who has indicated his/her understanding and acceptance.     Dental advisory given  Plan Discussed with: CRNA  Anesthesia Plan Comments: (See PAT note 08/07/2019, Konrad Felix, PA-C)        Anesthesia Quick Evaluation

## 2020-04-02 NOTE — Anesthesia Procedure Notes (Signed)
Procedure Name: LMA Insertion Date/Time: 04/02/2020 12:24 PM Performed by: Signe Colt, CRNA Pre-anesthesia Checklist: Patient identified, Emergency Drugs available, Suction available and Patient being monitored Patient Re-evaluated:Patient Re-evaluated prior to induction Oxygen Delivery Method: Circle System Utilized Preoxygenation: Pre-oxygenation with 100% oxygen Induction Type: IV induction Ventilation: Mask ventilation without difficulty LMA: LMA inserted LMA Size: 4.0 Number of attempts: 1 Airway Equipment and Method: bite block Placement Confirmation: positive ETCO2 Tube secured with: Tape Dental Injury: Teeth and Oropharynx as per pre-operative assessment

## 2020-04-02 NOTE — Op Note (Signed)
04/02/2020  2:20 PM  PATIENT:  [name] [name]  65 y.o. female  PRE-OPERATIVE DIAGNOSIS: Bilateral second and third hammertoe deformities with metatarsalgia  POST-OPERATIVE DIAGNOSIS: Same  Procedure(s): 1.  Bilateral second and third metatarsal Weil osteotomies 2.  Bilateral second and third hammertoe corrections 3.  Bilateral AP and lateral radiographs of the feet  SURGEON:  Wylene Simmer, MD  ASSISTANT: None  ANESTHESIA:   General, local  EBL:  minimal   TOURNIQUET:   Total Tourniquet Time Documented: Calf (Right) - 24 minutes Total: Calf (Right) - 24 minutes  Calf (Left) - 28 minutes Total: Calf (Left) - 28 minutes  COMPLICATIONS:  None apparent  DISPOSITION:  Extubated, awake and stable to recovery.  INDICATION FOR PROCEDURE: The patient is a 65 year old female with a past medical history significant for diabetes.  She has a long history of forefoot pain due to metatarsalgia and second and third hammertoe deformities.  She has failed nonoperative treatment to date and presents for operative correction of these painful forefoot deformities.  The risks and benefits of the alternative treatment options have been discussed in detail.  The patient wishes to proceed with surgery and specifically understands risks of bleeding, infection, nerve damage, blood clots, need for additional surgery, amputation and death.  PROCEDURE IN DETAIL after preoperative consent was obtained and the correct operative site was identified, the patient was brought to the operating room and placed upon the operating table.  General anesthesia was induced.  Preoperative antibiotics were administered.  A surgical timeout was taken.:  Both lower extremities were prepped and draped in standard sterile fashion.  The left lower extremity was exsanguinated and a 4 inch Esmarch tourniquet wrapped around the ankle.  A longitudinal incision was then made at the second webspace.  Dissection was carried down through the  subcutaneous tissues.  The second MTP joint was opened while protecting the extensor tendons.  The dorsal joint capsule was elevated medially and laterally.  The metatarsal head was exposed.  A Weil osteotomy was made with the oscillating saw.  The head of the metatarsal was allowed to retract proximally a few millimeters and was fixed with a 2 mm Zimmer [employer] FRS screw.  Overhanging bone was trimmed with a rondure.  Attention was turned to the third MTP joint where the same procedure was performed as described.  Attention was then turned to the second toe where a transverse incision was made over the PIP joint.  Dissection was carried sharply down through the skin and subcutaneous tissues as well as the extensor mechanism.  The head of the proximal phalanx was resected followed by the base of the middle phalanx.  The joint was reduced and fixed with a 3.4 mm Zimmer [employer]omet VPC screw.  Same procedure was then performed for the third toe again fixing the joint with a 3.4 millimeter screw.  AP and lateral radiographs confirmed appropriate correction of both hammertoe deformities as well as appropriate shortening of the second and third metatarsals.  The wounds were irrigated copiously and sprinkled with vancomycin powder.  Skin incisions were closed with nylon.  Sterile dressings were applied followed by a compression wrap.  The tourniquet was released after application of the dressings.  Attention was then turned to the right lower extremity.  The foot was exsanguinated and again an Esmarch tourniquet wrapped around the ankle.  The second and third metatarsals were shortened with Weil osteotomies as described above.  The second and third hammertoe deformities were corrected as described above.  AP and lateral radiographs again confirmed appropriate correction of the hammertoe deformities and shortening of the second and third metatarsals.  The wounds were dressed as described.  Both feet were anesthetized at the  second, third and fourth webspaces with half percent Marcaine with epinephrine.  The patient was awakened from anesthesia and transported to the recovery room in stable condition.   FOLLOW UP PLAN: Weightbearing as tolerated in flat postop shoes.  Follow-up in the office in 2 weeks for suture removal.  Plan 6 weeks of immobilization postop.  RADIOGRAPHS: AP and lateral radiographs of both feet are obtained intraoperatively.  These show interval shortening of the second and third metatarsals as well as correction of the second and third hammertoe deformities.  Hardware is appropriately positioned and of the appropriate lengths.  No other acute injuries are noted.

## 2020-04-02 NOTE — H&P (Signed)
[name] [name] is an 65 y.o. female.   Chief Complaint: Bilateral forefoot pain HPI: The patient is a 65 year old female with a past medical history significant for diabetes.  She has been pain at her second and third toes bilaterally.  She has bilateral hammertoe deformities and metatarsalgia.  She has failed nonoperative treatment to date including activity modification, oral anti-inflammatories and shoewear modification.  She presents now for operative treatment of her second and third hammertoe deformities bilaterally.  Past Medical History:  Diagnosis Date  . Acute meniscal tear of knee    Left knee  . Alcohol problem drinking    rehab  . Anemia    menstrual related  . Anxiety   . Arthritis    right ankle-neuropathy  . Colon polyps   . Depression   . DM (diabetes mellitus), type 2 ([place]) 12/2009   type 2  . Dyspnea   . Evalluate for OSA (obstructive sleep apnea) 08/15/2013   No OSA on sleep study but may be underestimated.-never used cpap    . GERD (gastroesophageal reflux disease)    not currently taking.  Marland Kitchen Hyperlipidemia   . Hypertension   . Hypothyroidism   . Liver disease   . Neuromuscular disorder ([place])    neropathy bilateral feet.  . Thyroid disease     Past Surgical History:  Procedure Laterality Date  . ADENOIDECTOMY    . BREAST SURGERY Right    lumpectomy-benign  . [place]   1 time  . CHOLECYSTECTOMY    . INNER EAR SURGERY     left ear had tubes put in and removed, scar tissue built up/ loss of hearing in left ear for over a year  . MASS EXCISION Left 12/16/2013   Procedure: EXCISION LEFT  DISTAL THIGH MASS;  Surgeon: Mauri Pole, MD;  Location: [facility];  Service: Orthopedics;  Laterality: Left;  . right ankle surgery      x 2- no retained hardware  . right rotator cuff surgery      left side  . TONSILLECTOMY    . TOTAL KNEE ARTHROPLASTY  2009   right  . TOTAL KNEE ARTHROPLASTY Left 05/05/2015   Procedure: LEFT TOTAL KNEE  ARTHROPLASTY;  Surgeon: Paralee Cancel, MD;  Location: [facility];  Service: Orthopedics;  Laterality: Left;  . TOTAL KNEE REVISION Right 12/16/2013   Procedure: RIGHT TOTAL KNEE REVISION POLY EXCHANGE;  Surgeon: Mauri Pole, MD;  Location: [facility];  Service: Orthopedics;  Laterality: Right;  . TOTAL KNEE REVISION Left 08/15/2019   Procedure: LEFT KNEE REVISION;  Surgeon: Paralee Cancel, MD;  Location: [facility];  Service: Orthopedics;  Laterality: Left;  2 hrs  . TUBAL LIGATION  1987    Family History  Adopted: Yes   Social History:  reports that she quit smoking about 11 years ago. Her smoking use included cigarettes. She started smoking about 53 years ago. She has a 45.00 pack-year smoking history. She has never used smokeless tobacco. She reports previous alcohol use. She reports previous drug use. Drug: Marijuana.  Allergies:  Allergies  Allergen Reactions  . Adhesive [Tape] Other (See Comments)    blisters  . Nitrofurantoin     Diffuse rash  . Other Other (See Comments)    (Neoprene)--causes blisters.     Medications Prior to Admission  Medication Sig Dispense Refill  . aspirin EC 81 MG tablet Take 81 mg by mouth daily. Swallow whole.    . benazepril (LOTENSIN) 20 MG tablet  Take 1 tablet (20 mg total) by mouth daily. 90 tablet 3  . buPROPion (WELLBUTRIN XL) 150 MG 24 hr tablet Take 1 tablet (150 mg total) by mouth daily. 90 tablet 3  . escitalopram (LEXAPRO) 20 MG tablet Take 1 tablet (20 mg total) by mouth daily. 90 tablet 3  . glimepiride (AMARYL) 4 MG tablet Take 1 tablet (4 mg total) by mouth daily before breakfast. 90 tablet 3  . levothyroxine (SYNTHROID) 75 MCG tablet Take 1 tablet (75 mcg total) by mouth daily. 90 tablet 3  . metFORMIN (GLUCOPHAGE XR) 500 MG 24 hr tablet Take 2 tablets (1,000 mg total) by mouth in the morning and at bedtime. 360 tablet 3  . simvastatin (ZOCOR) 20 MG tablet Take 1 tablet (20 mg total) by mouth at bedtime. TAKE 1 TABLET BY MOUTH EVERYDAY AT BEDTIME  90 tablet 3  . tiZANidine (ZANAFLEX) 4 MG capsule Take 1 capsule (4 mg total) by mouth 3 (three) times daily as needed for muscle spasms. 40 capsule 0  . traZODone (DESYREL) 50 MG tablet Take 1 tablet (50 mg total) by mouth at bedtime. 90 tablet 3  . albuterol (PROVENTIL HFA;VENTOLIN HFA) 108 (90 Base) MCG/ACT inhaler Inhale 2 puffs into the lungs every 6 (six) hours as needed for wheezing or shortness of breath. 1 Inhaler 6  . blood glucose meter kit and supplies KIT Dispense based on patient and insurance preference. Use up to four times daily as directed. DX E11.9 1 each 0  . glucose blood (ONETOUCH VERIO) test strip USE TO CHECK BLOOD SUGAR DAILY AND PRN 100 each 12    Results for orders placed or performed during the hospital encounter of 04-07-2020 (from the past 48 hour(s))  Glucose, capillary     Status: Abnormal   Collection Time: 2020/04/07 11:10 AM  Result Value Ref Range   Glucose-Capillary 131 (H) 70 - 99 mg/dL    Comment: Glucose reference range applies only to samples taken after fasting for at least 8 hours.   No results found.  Review of Systems no recent fever, chills, nausea, vomiting or changes in her appetite  Blood pressure 110/66, pulse 72, temperature 98.3 F (36.8 C), temperature source Oral, resp. rate 16, height '5\' 6"'  (1.676 m), weight 106.3 kg, SpO2 98 %. Physical Exam  Well-nourished well-developed woman in no apparent distress.  Alert and oriented x4.  Normal mood and affect.  Gait is normal.  On standing exam she has bilateral second and third hammertoe deformities.  Both feet have healthy and intact skin and normal sensibility to light touch.  Brisk capillary refill at the toes bilaterally.   Assessment/Plan Bilateral metatarsalgia and second and third hammertoe deformities -to the operating room today for second and third metatarsal Weil osteotomies and hammertoe corrections.  The risks and benefits of the alternative treatment options have been discussed in  detail.  The patient wishes to proceed with surgery and specifically understands risks of bleeding, infection, nerve damage, blood clots, need for additional surgery, amputation and death.   Wylene Simmer, MD 04-07-2020, 12:22 PM

## 2020-04-03 ENCOUNTER — Encounter (HOSPITAL_BASED_OUTPATIENT_CLINIC_OR_DEPARTMENT_OTHER): Payer: Self-pay | Admitting: Orthopedic Surgery

## 2020-04-03 NOTE — Anesthesia Postprocedure Evaluation (Signed)
Anesthesia Post Note  Patient: [name] [name]  Procedure(s) Performed: Bilateral 2-3 Weil and hammertoe corrections (Bilateral Foot)     Patient location during evaluation: PACU Anesthesia Type: General Level of consciousness: awake Pain management: pain level controlled Vital Signs Assessment: post-procedure vital signs reviewed and stable Respiratory status: spontaneous breathing Cardiovascular status: stable Postop Assessment: no apparent nausea or vomiting Anesthetic complications: no   No complications documented.  Last Vitals:  Vitals:   04/02/20 1445 04/02/20 1502  BP: 140/71 (!) 141/89  Pulse: 76 81  Resp: 15 14  Temp:  36.9 C  SpO2: 93% 96%    Last Pain:  Vitals:   04/02/20 1502  TempSrc:   PainSc: 0-No pain                 Huston Foley

## 2020-04-08 ENCOUNTER — Other Ambulatory Visit: Payer: Self-pay | Admitting: Family Medicine

## 2020-04-08 LAB — CBC AND DIFFERENTIAL
HCT: 46 (ref 36–46)
Hemoglobin: 15.7 (ref 12.0–16.0)
Neutrophils Absolute: 47
Platelets: 258 (ref 150–399)
WBC: 5.6

## 2020-04-08 LAB — BASIC METABOLIC PANEL
BUN: 13 (ref 4–21)
Creatinine: 0.9 (ref 0.5–1.1)
Glucose: 105

## 2020-04-08 LAB — CBC: RBC: 4.75 (ref 3.87–5.11)

## 2020-04-08 LAB — COMPREHENSIVE METABOLIC PANEL
GFR calc Af Amer: 75
GFR calc non Af Amer: 65

## 2020-04-09 LAB — COMPREHENSIVE METABOLIC PANEL
ALT: 8 IU/L (ref 0–32)
AST: 16 IU/L (ref 0–40)
Albumin/Globulin Ratio: 1.6 (ref 1.2–2.2)
Albumin: 4 g/dL (ref 3.8–4.8)
Alkaline Phosphatase: 77 IU/L (ref 44–121)
BUN/Creatinine Ratio: 13 (ref 12–28)
BUN: 12 mg/dL (ref 8–27)
Bilirubin Total: 1.1 mg/dL (ref 0.0–1.2)
CO2: 21 mmol/L (ref 20–29)
Calcium: 10.2 mg/dL (ref 8.7–10.3)
Chloride: 101 mmol/L (ref 96–106)
Creatinine, Ser: 0.93 mg/dL (ref 0.57–1.00)
GFR calc Af Amer: 75 mL/min/{1.73_m2} (ref >59)
GFR calc non Af Amer: 65 mL/min/{1.73_m2} (ref >59)
Globulin, Total: 2.5 g/dL (ref 1.5–4.5)
Glucose: 105 mg/dL — ABNORMAL HIGH (ref 65–99)
Potassium: 4.3 mmol/L (ref 3.5–5.2)
Sodium: 140 mmol/L (ref 134–144)
Total Protein: 6.5 g/dL (ref 6.0–8.5)

## 2020-04-09 LAB — HEMOGLOBIN A1C
Est. average glucose Bld gHb Est-mCnc: 105 mg/dL
Hgb A1c MFr Bld: 5.3 % (ref 4.8–5.6)

## 2020-04-09 LAB — LIPID PANEL
Chol/HDL Ratio: 2.6 ratio (ref 0.0–4.4)
Cholesterol, Total: 169 mg/dL (ref 100–199)
HDL: 66 mg/dL (ref >39)
LDL Chol Calc (NIH): 87 mg/dL (ref 0–99)
Triglycerides: 88 mg/dL (ref 0–149)
VLDL Cholesterol Cal: 16 mg/dL (ref 5–40)

## 2020-04-10 ENCOUNTER — Encounter: Payer: Self-pay | Admitting: Family Medicine

## 2020-04-10 LAB — CBC WITH DIFFERENTIAL/PLATELET
Basophils Absolute: 0 10*3/uL (ref 0.0–0.2)
Basos: 1 %
EOS (ABSOLUTE): 0.1 10*3/uL (ref 0.0–0.4)
Eos: 1 %
Hematocrit: 46.3 % (ref 34.0–46.6)
Hemoglobin: 15.7 g/dL (ref 11.1–15.9)
Immature Grans (Abs): 0 10*3/uL (ref 0.0–0.1)
Immature Granulocytes: 0 %
Lymphocytes Absolute: 2.4 10*3/uL (ref 0.7–3.1)
Lymphs: 43 %
MCH: 33.1 pg — ABNORMAL HIGH (ref 26.6–33.0)
MCHC: 33.9 g/dL (ref 31.5–35.7)
MCV: 98 fL — ABNORMAL HIGH (ref 79–97)
Monocytes Absolute: 0.4 10*3/uL (ref 0.1–0.9)
Monocytes: 8 %
Neutrophils Absolute: 2.6 10*3/uL (ref 1.4–7.0)
Neutrophils: 47 %
Platelets: 258 10*3/uL (ref 150–450)
RBC: 4.75 x10E6/uL (ref 3.77–5.28)
RDW: 12.9 % (ref 11.7–15.4)
WBC: 5.6 10*3/uL (ref 3.4–10.8)

## 2020-04-10 LAB — SPECIMEN STATUS REPORT

## 2020-04-13 ENCOUNTER — Encounter: Payer: Self-pay | Admitting: Family Medicine

## 2020-04-13 ENCOUNTER — Other Ambulatory Visit: Payer: Self-pay

## 2020-04-13 ENCOUNTER — Ambulatory Visit (INDEPENDENT_AMBULATORY_CARE_PROVIDER_SITE_OTHER): Payer: Medicare Other | Admitting: Family Medicine

## 2020-04-13 VITALS — BP 120/65 | HR 76 | Temp 97.7°F | Ht 66.0 in | Wt 230.0 lb

## 2020-04-13 DIAGNOSIS — Z0000 Encounter for general adult medical examination without abnormal findings: Secondary | ICD-10-CM | POA: Diagnosis not present

## 2020-04-13 DIAGNOSIS — Z87891 Personal history of nicotine dependence: Secondary | ICD-10-CM

## 2020-04-13 DIAGNOSIS — E039 Hypothyroidism, unspecified: Secondary | ICD-10-CM | POA: Diagnosis not present

## 2020-04-13 DIAGNOSIS — E119 Type 2 diabetes mellitus without complications: Secondary | ICD-10-CM | POA: Diagnosis not present

## 2020-04-13 NOTE — Patient Instructions (Addendum)
Health Maintenance Due  Topic Date Due  . FOOT EXAM - next visit 02/08/2020  . PNA vac Low Risk Adult (2 of 2 - PPSV23)- prevnar 20 next visit if we have it available 03/12/2020     [name] , Thank you for taking time to come for your Medicare Wellness Visit. I appreciate your ongoing commitment to your health goals. Please review the following plan we discussed and let me know if I can assist you in the future.   These are the goals we discussed: 1. Once foot heals- get restarted on exercise goal 150 minutes a week 2. Keep up great job with lifestyle changes/weight loss   This is a list of the screening recommended for you and due dates:  Health Maintenance  Topic Date Due  . Complete foot exam   02/08/2020  . Pneumonia vaccines (2 of 2 - PPSV23) 03/12/2020  . Hemoglobin A1C  10/06/2020  . Eye exam for diabetics  01/26/2021  . Mammogram  03/10/2021  . Pap Smear  02/07/2022  . Tetanus Vaccine  06/07/2022  . Colon Cancer Screening  01/11/2023  . Flu Shot  Completed  . DEXA scan (bone density measurement)  Completed  . COVID-19 Vaccine  Completed  .  Hepatitis C: One time screening is recommended by Center for Disease Control  (CDC) for  adults born from [birth year] through 1966.   Completed  . HIV Screening  Completed   diabetes is very well controlled- we opted to stop glimepride and she will let me know if morning sugars get above 130 or has a lot of sugars over 180. Continue metformin   Recommended follow up: Return in about 4 months (around 08/11/2020) for follow up- or sooner if needed. can do a few days before your visit in 4 months

## 2020-04-13 NOTE — Progress Notes (Signed)
Phone: [phone]   Subjective:  Patient presents today for their Welcome to Medicare Exam    Preventive Screening-Counseling & Management  Vision screen:   Hearing Screening   '125Hz'  '250Hz'  '500Hz'  '1000Hz'  '2000Hz'  '3000Hz'  '4000Hz'  '6000Hz'  '8000Hz'   Right ear:           Left ear:             Visual Acuity Screening   Right eye Left eye Both eyes  Without correction:     With correction: '20/25 20/30 20/20 '   Advanced directives: husband HCPOA, Full Code  Modifiable Risk Factors/behavioral risk assessment/psychosocial risk assessment Regular exercise: not as much in last week- encouraged restart Diet: down 8 lbs since last visit- cutting back on portion sizes- encouraged cutting back on processed foods- fruits and veggies focused  Wt Readings from Last 3 Encounters:  04/13/20 230 lb (104.3 kg)  04/02/20 234 lb 5.6 oz (106.3 kg)  12/09/19 242 lb (109.8 kg)  Smoking Status: Former Smoker- quit in 2011 Second Hand Smoking status: No smokers in home Alcohol intake: still alcohol free! 6 years march 2023 Other substance abuse/illicit drugs: none  Cardiac risk factors:  advanced age (older than 15 for men, 36 for women)  treated Hyperlipidemia  treated Hypertension  Controlled diabetes.  Lab Results  Component Value Date   HGBA1C 5.3 04/08/2020  did have some coronary artery calcium on last scan  Family History: unknown as adopted  Family History  Adopted: Yes   Depression Screen/risk evaluation Risk factors: remains treated/controlled.Marland Kitchen PHQ2 0  Depression screen [facility] 2/9 04/13/2020 08/09/2019 06/19/2018 11/13/2017 06/28/2017  Decreased Interest 0 0 1 0 0  Down, Depressed, Hopeless 0 0 0 - 0  PHQ - 2 Score 0 0 1 0 0  Altered sleeping - 0 0 3 3  Tired, decreased energy - 0 '1 3 3  ' Change in appetite - 0 0 1 2  Feeling bad or failure about yourself  - 0 0 0 0  Trouble concentrating - 0 '1 2 1  ' Moving slowly or fidgety/restless - 0 0 0 0  Suicidal thoughts - 0 0 0 0  PHQ-9 Score - 0 '3 9  9  ' Difficult doing work/chores - Not difficult at all Not difficult at all Very difficult Somewhat difficult  Some recent data might be hidden   Functional ability and level of safety Mobility assessment:  timed get up and go <12 seconds even despite recent surgery Activities of Daily Living- Independent in ADLs (toileting, bathing, dressing, transferring, eating) and in IADLs (shopping, housekeeping, managing own medications, and handling finances) Home Safety: Loose rugs (in bathroom- advised against), smoke detectors (up to date), small pets (just 1 but has not had any issues), grab bars (no- could consider), stairs (no stairs in home), life-alert system (would use cell phone) Hearing Difficulties: -patient declines as long as wears hearing aids Fall Risk: None  Other than recent surgery Fall Risk  04/13/2020 08/09/2019 05/24/2016  Falls in the past year? 0 0 No  Number falls in past yr: 0 0 -  Injury with Fall? 0 0 -  Opioid use history:  no long term opioids use, currently on opioids post surgery Self assessment of health status: "good"  Required Immunizations needed today:   prevnar 20 recommended- we do not have this yet available- will address at future visit or could get at pharmacy Immunization History  Administered Date(s) Administered  . Fluad Quad(high Dose 65+) 12/09/2019  . Influenza Split 01/06/2011  .  Influenza, Quadrivalent, Recombinant, Inj, Pf 11/02/2018  . Influenza,inj,Quad PF,6+ Mos 12/14/2012, 11/21/2013, 11/06/2014, 11/28/2016, 11/13/2017  . Influenza-Unspecified 11/27/2015  . Moderna Sars-Covid-2 Vaccination 04/17/2019, 05/15/2019, 12/30/2019  . Pneumococcal Conjugate-13 10/17/2013  . Pneumococcal Polysaccharide-23 03/13/2015  . Pneumococcal-Unspecified 02/21/2006  . Tdap 06/06/2012  . Zoster 10/09/2014  . Zoster Recombinat (Shingrix) 02/08/2019, 05/30/2019   Health Maintenance  Topic Date Due  . Complete foot exam   02/08/2020  . Pneumonia vaccines (2 of 2 -  PPSV23) 03/12/2020  . Hemoglobin A1C  10/06/2020  . Eye exam for diabetics  01/26/2021  . Mammogram  03/10/2021  . Pap Smear  02/07/2022  . Tetanus Vaccine  06/07/2022  . Colon Cancer Screening  01/11/2023  . Flu Shot  Completed  . DEXA scan (bone density measurement)  Completed  . COVID-19 Vaccine  Completed  .  Hepatitis C: One time screening is recommended by Center for Disease Control  (CDC) for  adults born from [birth year] through 1966.   Completed  . HIV Screening  Completed   Screening tests-  1. Colon cancer screening- 01/10/2018 with 5 year repeat planned with only 1 adenoma on last visit 2. Lung Cancer screening- referred today 3. Skin cancer screening- does not see dermatology. Denies changing/worrisome skin lesions 4. Cervical cancer screening- 02/08/2019-negative HPV and negative for intraepithelial lesion or malignancy.  Transformation zone was absent-this was at a time where we were getting many reports from pathology on an adequate transformation zone.  We discussed repeating today or in the future and she would like to do this next year at 3 years out  5. Breast cancer screening- 03/11/2019- she plants to call to schedule yearly exam.   The following were reviewed and entered/updated in epic: Past Medical History:  Diagnosis Date  . Acute meniscal tear of knee    Left knee  . Alcohol problem drinking    rehab  . Anemia    menstrual related  . Anxiety   . Arthritis    right ankle-neuropathy  . Colon polyps   . Depression   . DM (diabetes mellitus), type 2 ([place]) 12/2009   type 2  . Dyspnea   . Evalluate for OSA (obstructive sleep apnea) 08/15/2013   No OSA on sleep study but may be underestimated.-never used cpap    . GERD (gastroesophageal reflux disease)    not currently taking.  Marland Kitchen Hyperlipidemia   . Hypertension   . Hypothyroidism   . Liver disease   . Neuromuscular disorder ([place])    neropathy bilateral feet.  . Thyroid disease    Patient Active Problem  List   Diagnosis Date Noted  . Solitary pulmonary nodule 06/08/2016    Priority: High  . Alcoholic fatty liver 41/96/2229    Priority: High  . Type II diabetes mellitus, well controlled ([place]) 06/23/2010    Priority: High  . Alcohol use disorder, severe, in sustained remission ([place]) 06/23/2010    Priority: High  . Morbid obesity ([place]) 06/23/2010    Priority: High  . Chronic depression 06/23/2010    Priority: High  . Migraine 07/28/2016    Priority: Medium  . Hypothyroid 07/21/2010    Priority: Medium  . Hypertension associated with diabetes ([place]) 06/23/2010    Priority: Medium  . Hyperlipidemia associated with type 2 diabetes mellitus ([place]) 06/23/2010    Priority: Medium  . S/P knee replacement 05/05/2015    Priority: Low  . Former smoker 07/10/2014    Priority: Low  . Osteoarthritis 05/16/2014  Priority: Low  . Chronic post-traumatic stress disorder (PTSD) 05/02/2014    Priority: Low  . Sleep disorder 05/02/2014    Priority: Low  . Family history of alcoholism 05/02/2014    Priority: Low  . Macrocytosis 04/19/2014    Priority: Low  . Fatigue 08/15/2013    Priority: Low  . Thyroid nodule 09/17/2012    Priority: Low  . History of colonic polyps 04/12/2010    Priority: Low  . S/P revision of total knee, left 08/15/2019  . Preop cardiovascular exam 08/13/2019  . Osteoarthritis of ankle, right 01/29/2018  . Disorder of tendon of posterior tibial muscle 07/06/2017  . Sleep related headaches 09/07/2016  . Nightmares REM-sleep type 09/07/2016  . RLS (restless legs syndrome) 09/07/2016  . Snoring 09/07/2016  . Excessive daytime sleepiness 09/07/2016  . Shortness of breath 06/07/2014  . Chest pain 06/07/2014   Past Surgical History:  Procedure Laterality Date  . ADENOIDECTOMY    . BREAST SURGERY Right    lumpectomy-benign  . [place]   1 time  . CHOLECYSTECTOMY    . HAMMERTOE RECONSTRUCTION WITH WEIL OSTEOTOMY Bilateral 04/02/2020   Procedure:  Bilateral 2-3 Weil and hammertoe corrections;  Surgeon: Wylene Simmer, MD;  Location: [facility];  Service: Orthopedics;  Laterality: Bilateral;  . INNER EAR SURGERY     left ear had tubes put in and removed, scar tissue built up/ loss of hearing in left ear for over a year  . MASS EXCISION Left 12/16/2013   Procedure: EXCISION LEFT  DISTAL THIGH MASS;  Surgeon: Mauri Pole, MD;  Location: [facility];  Service: Orthopedics;  Laterality: Left;  . right ankle surgery      x 2- no retained hardware  . right rotator cuff surgery      left side  . TONSILLECTOMY    . TOTAL KNEE ARTHROPLASTY  2009   right  . TOTAL KNEE ARTHROPLASTY Left 05/05/2015   Procedure: LEFT TOTAL KNEE ARTHROPLASTY;  Surgeon: Paralee Cancel, MD;  Location: [facility];  Service: Orthopedics;  Laterality: Left;  . TOTAL KNEE REVISION Right 12/16/2013   Procedure: RIGHT TOTAL KNEE REVISION POLY EXCHANGE;  Surgeon: Mauri Pole, MD;  Location: [facility];  Service: Orthopedics;  Laterality: Right;  . TOTAL KNEE REVISION Left 08/15/2019   Procedure: LEFT KNEE REVISION;  Surgeon: Paralee Cancel, MD;  Location: [facility];  Service: Orthopedics;  Laterality: Left;  2 hrs  . TUBAL LIGATION  1987    Family History  Adopted: Yes    Medications- reviewed and updated Current Outpatient Medications  Medication Sig Dispense Refill  . albuterol (PROVENTIL HFA;VENTOLIN HFA) 108 (90 Base) MCG/ACT inhaler Inhale 2 puffs into the lungs every 6 (six) hours as needed for wheezing or shortness of breath. 1 Inhaler 6  . aspirin EC 81 MG tablet Take 81 mg by mouth daily. Swallow whole.    . benazepril (LOTENSIN) 20 MG tablet Take 1 tablet (20 mg total) by mouth daily. 90 tablet 3  . blood glucose meter kit and supplies KIT Dispense based on patient and insurance preference. Use up to four times daily as directed. DX E11.9 1 each 0  . buPROPion (WELLBUTRIN XL) 150 MG 24 hr tablet Take 1 tablet (150 mg total) by mouth daily. 90 tablet 3  .  CRANBERRY PO Take by mouth.    . escitalopram (LEXAPRO) 20 MG tablet Take 1 tablet (20 mg total) by mouth daily. 90 tablet 3  . glucose  blood (ONETOUCH VERIO) test strip USE TO CHECK BLOOD SUGAR DAILY AND PRN 100 each 12  . HYDROcodone-acetaminophen (NORCO/VICODIN) 5-325 MG tablet Take 1 tablet by mouth every 6 (six) hours as needed.    Marland Kitchen levothyroxine (SYNTHROID) 75 MCG tablet Take 1 tablet (75 mcg total) by mouth daily. 90 tablet 3  . metFORMIN (GLUCOPHAGE XR) 500 MG 24 hr tablet Take 2 tablets (1,000 mg total) by mouth in the morning and at bedtime. 360 tablet 3  . simvastatin (ZOCOR) 20 MG tablet Take 1 tablet (20 mg total) by mouth at bedtime. TAKE 1 TABLET BY MOUTH EVERYDAY AT BEDTIME 90 tablet 3  . tiZANidine (ZANAFLEX) 4 MG capsule Take 1 capsule (4 mg total) by mouth 3 (three) times daily as needed for muscle spasms. 40 capsule 0  . traZODone (DESYREL) 50 MG tablet Take 1 tablet (50 mg total) by mouth at bedtime. 90 tablet 3   No current facility-administered medications for this visit.    Allergies-reviewed and updated Allergies  Allergen Reactions  . Adhesive [Tape] Other (See Comments)    blisters  . Nitrofurantoin     Diffuse rash  . Other Other (See Comments)    (Neoprene)--causes blisters.     Social History   Socioeconomic History  . Marital status: Married    Spouse name: Not on file  . Number of children: Not on file  . Years of education: Not on file  . Highest education level: Not on file  Occupational History  . Not on file  Tobacco Use  . Smoking status: Former Smoker    Packs/day: 1.50    Years: 30.00    Pack years: 45.00    Types: Cigarettes    Start date: 1969    Quit date: 02/21/2009    Years since quitting: 11.1  . Smokeless tobacco: Never Used  Vaping Use  . Vaping Use: Some days  . Substances: CBD  Substance and Sexual Activity  . Alcohol use: Not Currently  . Drug use: Not Currently    Types: Marijuana    Comment: last time 3 days ago  .  Sexual activity: Yes    Birth control/protection: Post-menopausal  Other Topics Concern  . Not on file  Social History Narrative   Married. 1 daughter. No grandkids. 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       Hobbies: shopping, travel   Social Determinants of Health   Financial Resource Strain: no  Food Insecurity: no   Transportation Needs:  none  Physical Activity: limited by recent foot surgyer- discussed once healed would target 150 minutes per week   Stress: recent foot surgery/pain! Otherwise doing well  Social Connections: feels fulfilled     Objective  Objective:  BP 120/65   Pulse 76   Temp 97.7 F (36.5 C) (Temporal)   Ht '5\' 6"'  (1.676 m)   Wt 230 lb (104.3 kg)   SpO2 96%   BMI 37.12 kg/m  Gen: NAD, resting comfortably HEENT: Mucous membranes are moist. Oropharynx normal. Scarred left TM, normal right TM Neck: no thyromegaly CV: RRR no murmurs rubs or gallops Lungs: CTAB no crackles, wheeze, rhonchi Abdomen: soft/nontender/nondistended/normal bowel sounds. No rebound or guarding.  Ext: no edema Skin: warm, dry Neuro: grossly normal, moves all extremities, PERRLA   Foot exam deferred due to recent foot surgery/dressings      Assessment and Plan:   Welcome to Medicare exam completed-  1. Educated, counseled  and referred based on above elements 2. Educated, counseled and referred as appropriate for preventative needs 3. Discussed and documented a written plan for preventiative services and screenings with personalized health advice- After Visit Summary was given to patient which included this plan  4. EKG offered [id number]- patient opts out  Status of chronic or acute concerns   #Pulmonary nodule-follows with Dr. Ander Slade.  Plans for scans every 2 years until 5 years of stability noted. Will strill refer to lung cancer program but may be disqualified.   #Alcohol free-now well over 5 years! Almost 6    # Diabetes S:  Medication:Metformin 1000 mg extended release BID, Amaryl 4 mg CBGs- very rare low blood sugars - this is if she does not eat (advised her to avoid) Lab Results  Component Value Date   HGBA1C 5.3 04/08/2020   HGBA1C 6.1 (H) 12/04/2019   HGBA1C 6.1 12/04/2019    A/P: diabetes is very well controlled- we opted to stop glimepride and she will let me know if morning sugars get above 130 or has a lot of sugars over 180. Continue metformin    #hypertension S: medication: Benazepril 20 mg BP Readings from Last 3 Encounters:  04/13/20 120/65  04/02/20 (!) 141/89  12/09/19 128/72  A/P: Stable. Continue current medications.    #hyperlipidemia S: Medication:Simvastatin 20 mg Lab Results  Component Value Date   CHOL 169 04/08/2020   HDL 66 04/08/2020   LDLCALC 87 04/08/2020   LDLDIRECT 158.6 02/26/2014   TRIG 88 04/08/2020   CHOLHDL 2.6 04/08/2020   A/P: Reasonable control but LDL not at ideal goal of 70 or less-we discussed bumping dose vs working on lifestyle- making good progress on lifestyle- I think its reasonable to continue meds and recheck In 1 year.    # Depression/anxiety S: Medication: Lexapro 20 mg, Wellbutrin 150 mg.  Also sometimes uses trazodone for sleep or tizanidine for muscle spasms A/P: Stable. Continue current medications.  Full remission    #hypothyroidism S: compliant On thyroid medication-levothyroxine 88  Mcg prior to 75 mcg  Lab Results  Component Value Date   TSH 0.101 (L) 12/04/2019   A/P:poor control last visit on thyroid- dose now lower- rechecksince was not drawn with [facility] - will print and take to [facility]  #Had been on iron in the past-no anemia despite stopping this Lab Results  Component Value Date   WBC 5.6 04/08/2020   HGB 15.7 04/08/2020   HCT 46 04/08/2020   MCV 94 12/04/2019   PLT 258 04/08/2020   # seen by [facility] on 06/10/2019  - knee revision  #Hammertoe surgery 03/2220-patient reports doing well   #alcoholic fatty liver- LFTs  ok- continue to work on weight loss Lab Results  Component Value Date   ALT 8 04/08/2020   AST 16 04/08/2020   ALKPHOS 77 04/08/2020   BILITOT 1.1 04/08/2020    Recommended follow up: 4 month follow up planned- wants labs before visit- asked her to reach out to [place] later   Lab/Order associations:   ICD-10-CM   1. Former smoker  Z87.891 Ambulatory Referral for Lung Cancer Scre    No orders of the defined types were placed in this encounter.   Return precautions advised. [name], MD

## 2020-04-17 ENCOUNTER — Other Ambulatory Visit: Payer: Self-pay | Admitting: Family Medicine

## 2020-04-17 LAB — TSH: TSH: 3.62 (ref 0.41–5.90)

## 2020-04-18 ENCOUNTER — Encounter: Payer: Self-pay | Admitting: Family Medicine

## 2020-04-18 LAB — TSH: TSH: 3.62 u[IU]/mL (ref 0.450–4.500)

## 2020-05-11 ENCOUNTER — Other Ambulatory Visit: Payer: Self-pay

## 2020-05-11 ENCOUNTER — Encounter: Payer: Self-pay | Admitting: Family Medicine

## 2020-05-11 DIAGNOSIS — E039 Hypothyroidism, unspecified: Secondary | ICD-10-CM

## 2020-05-11 MED ORDER — METFORMIN HCL ER 500 MG PO TB24: 1000.0000 mg | ORAL_TABLET | Freq: Two times a day (BID) | ORAL | 3 refills | Status: DC

## 2020-05-11 MED ORDER — BENAZEPRIL HCL 20 MG PO TABS: 20.0000 mg | ORAL_TABLET | Freq: Every day | ORAL | 3 refills | Status: DC

## 2020-05-11 MED ORDER — BUPROPION HCL ER (XL) 150 MG PO TB24: 150.0000 mg | ORAL_TABLET | Freq: Every day | ORAL | 3 refills | Status: DC

## 2020-05-11 MED ORDER — ESCITALOPRAM OXALATE 20 MG PO TABS: 20.0000 mg | ORAL_TABLET | Freq: Every day | ORAL | 3 refills | Status: DC

## 2020-05-11 MED ORDER — LEVOTHYROXINE SODIUM 75 MCG PO TABS: 75.0000 ug | ORAL_TABLET | Freq: Every day | ORAL | 3 refills | Status: DC

## 2020-05-11 MED ORDER — SIMVASTATIN 20 MG PO TABS: 20.0000 mg | ORAL_TABLET | Freq: Every day | ORAL | 3 refills | Status: DC

## 2020-05-11 MED ORDER — TRAZODONE HCL 50 MG PO TABS: 50.0000 mg | ORAL_TABLET | Freq: Every day | ORAL | 3 refills | Status: DC

## 2020-06-20 ENCOUNTER — Encounter: Payer: Self-pay | Admitting: Family Medicine

## 2020-06-22 ENCOUNTER — Other Ambulatory Visit: Payer: Self-pay

## 2020-06-22 ENCOUNTER — Ambulatory Visit: Payer: Medicare Other | Attending: Orthopedic Surgery

## 2020-06-22 DIAGNOSIS — M546 Pain in thoracic spine: Secondary | ICD-10-CM | POA: Insufficient documentation

## 2020-06-22 DIAGNOSIS — R293 Abnormal posture: Secondary | ICD-10-CM | POA: Diagnosis present

## 2020-06-22 DIAGNOSIS — R0989 Other specified symptoms and signs involving the circulatory and respiratory systems: Secondary | ICD-10-CM | POA: Diagnosis present

## 2020-06-22 DIAGNOSIS — R252 Cramp and spasm: Secondary | ICD-10-CM | POA: Diagnosis present

## 2020-06-22 NOTE — Patient Instructions (Signed)
Access Code: [email or web address] URL: [email or web address] Date: 06/22/2020 Prepared by: Claiborne Billings  Exercises Seated Scapular Retraction - 3 x daily - 7 x weekly - 1 sets - 10 reps - 5 hold Seated Correct Posture - 1 x daily - 7 x weekly - 1 sets - 10 reps Supine Lower Trunk Rotation - 1 x daily - 7 x weekly - 3 sets - 10 reps Sidelying Thoracic Rotation with Open Book - 2 x daily - 7 x weekly - 1 sets - 10 reps - 5 hold Seated Thoracic Flexion and Rotation with Arms Crossed - 2 x daily - 7 x weekly - 1 sets - 10 reps - 5 hold

## 2020-06-22 NOTE — Therapy (Signed)
[facility] [address] [place], [place], [address] Phone: [phone]   Fax:  [phone]  Physical Therapy Evaluation  Patient Details  Name: [name] [name] MRN: [id number] Date of Birth: [birth year] Referring Provider (PT): Ward, [place], [place]   Encounter Date: 06/22/2020   PT End of Session - 06/22/20 1616    Visit Number 1    Date for PT Re-Evaluation 08/17/20    Authorization Type Medicare/AARP- KX at 15    PT Start Time 1530    PT Stop Time 1612    PT Time Calculation (min) 42 min    Behavior During Therapy [facility] for tasks assessed/performed           Past Medical History:  Diagnosis Date  . Acute meniscal tear of knee    Left knee  . Alcohol problem drinking    rehab  . Anemia    menstrual related  . Anxiety   . Arthritis    right ankle-neuropathy  . Colon polyps   . Depression   . DM (diabetes mellitus), type 2 (HCC) 12/2009   type 2  . Dyspnea   . Evalluate for OSA (obstructive sleep apnea) 08/15/2013   No OSA on sleep study but may be underestimated.-never used cpap    . GERD (gastroesophageal reflux disease)    not currently taking.  Marland Kitchen Hyperlipidemia   . Hypertension   . Hypothyroidism   . Liver disease   . Neuromuscular disorder (HCC)    neropathy bilateral feet.  . Thyroid disease     Past Surgical History:  Procedure Laterality Date  . ADENOIDECTOMY    . BREAST SURGERY Right    lumpectomy-benign  . CESAREAN SECTION  1987   1 time  . CHOLECYSTECTOMY    . HAMMER TOE SURGERY     Dr. Victorino [facility]- feb 2022  . HAMMERTOE RECONSTRUCTION WITH WEIL OSTEOTOMY Bilateral 04/02/2020   Procedure: Bilateral 2-3 Weil and hammertoe corrections;  Surgeon: Toni Arthurs, MD;  Location: [facility];  Service: Orthopedics;  Laterality: Bilateral;  . INNER EAR SURGERY     left ear had tubes put in and removed, scar tissue built up/ loss of hearing in left ear for over a year  .  MASS EXCISION Left 12/16/2013   Procedure: EXCISION LEFT  DISTAL THIGH MASS;  Surgeon: Shelda Pal, MD;  Location[facility];  Service: Orthopedics;  Laterality: Left;  . right ankle surgery      x 2- no retained hardware  . right rotator cuff surgery      left side  . TONSILLECTOMY    . TOTAL KNEE ARTHROPLASTY  2009   right  . TOTAL KNEE ARTHROPLASTY Left 05/05/2015   Procedure: LEFT TOTAL KNEE ARTHROPLASTY;  Surgeon: Durene Romans, MD;  [facility];  Service: Orthopedics;  Laterality: Left;  . TOTAL KNEE REVISION Right 12/16/2013   Procedure: RIGHT TOTAL KNEE REVISION POLY EXCHANGE;  Surgeon: Shelda Pal, MD;  [facility];  Service: Orthopedics;  Laterality: Right;  . TOTAL KNEE REVISION Left 08/15/2019   Procedure: LEFT KNEE REVISION;  Surgeon: Durene Romans, MD;  [facility];  Service: Orthopedics;  Laterality: Left;  2 hrs  . TUBAL LIGATION  1987    There were no vitals filed for this visit.    Subjective Assessment - 06/22/20 1535    Subjective Pt presents to Pt with 2-3 month history of thoracic pain.  Pt noticed that she would  experience sharp pain when bending forward and returning to erect posture.  Pt also experiences spasms in the thoracic area.    Pertinent History chronic LBP, DM, HTN, TKA bil with revision.    Limitations Sitting;Standing    How long can you sit comfortably? 1-2 hours    How long can you stand comfortably? 2-3 hours with housework- pain after    How long can you walk comfortably? no pain while walking, has pain after    Diagnostic tests x-ray: DDD, no nerve impingement- per MD report    Patient Stated Goals reduce thoracic pain during and after activity    Currently in Pain? Yes    Pain Score 1    tightness at rest, 10/10 after standing and walking long periods.  8/10 with bending forward and then standing.   Pain Location Thoracic    Pain Orientation Left;Right;Mid    Pain Type Chronic pain    Pain Onset More than a month ago    Pain  Frequency Constant    Aggravating Factors  after walking or standing long periods    Pain Relieving Factors sitting down, muscle relaxer (at night), heat, pain patches              [facility] PT Assessment - 06/22/20 0001      Assessment   Medical Diagnosis thoracic back pain    Referring Provider (PT) Ward, [place], [facility]    Onset Date/Surgical Date 04/16/20    Next MD Visit none    Prior Therapy none      Precautions   Precautions None      Restrictions   Weight Bearing Restrictions No      Balance Screen   Has the patient fallen in the past 6 months No    Has the patient had a decrease in activity level because of a fear of falling?  No    Is the patient reluctant to leave their home because of a fear of falling?  No      Home Ecologist residence    Living Arrangements Spouse/significant other    Type of [place]      Prior Function   Level of [place] Retired    Leisure crafts      Cognition   Overall Cognitive Status Within Functional Limits for tasks assessed      Observation/Other Assessments   Focus on Therapeutic Outcomes ([place])  49 (goal is 59)      Posture/Postural Control   Posture/Postural Control Postural limitations    Postural Limitations Rounded Shoulders;Forward head;Flexed trunk      ROM / Strength   AROM / PROM / Strength AROM;PROM;Strength      AROM   Overall AROM  Deficits    Overall AROM Comments cervical A/ROM is limited by 25-50%- baseline for this patient.  Reduced thoracic segmental mobility into sidebending.      PROM   Overall PROM  Deficits      Strength   Overall Strength Within functional limits for tasks performed      Palpation   Spinal mobility reduced segmental mobility in the thoracic and lumbar spine. Pain T7-12 with PA mobs    Palpation comment spasm, trigger points an tension in the lower thoracic spine bilaterally      Transfers   Transfers Stand to Sit;Sit  to Stand    Sit to Stand 6: Modified independent (Device/Increase time)  Ambulation/Gait   Ambulation/Gait Yes    Ambulation/Gait Assistance 7: Independent                      Objective measurements completed on examination: See above findings.               PT Education - 06/22/20 1608    Education Details Access Code: [email or web address]    Person(s) Educated Patient    Methods Explanation;Demonstration    Comprehension Verbalized understanding;Returned demonstration            PT Short Term Goals - 06/22/20 1528      PT SHORT TERM GOAL #1   Title be independent in initial HEP    Time 4    Period Weeks    Status New    Target Date 07/20/20      PT SHORT TERM GOAL #2   Title report a 30% reduction in thoracic pain with self-care and ADLs    Baseline ---    Time 4    Period Weeks    Status New    Target Date 07/20/20             PT Long Term Goals - 06/22/20 1529      PT LONG TERM GOAL #1   Title be independent in advanced HEP    Time 8    Period Weeks    Status New    Target Date 08/17/20      PT LONG TERM GOAL #2   Title FOTO improved to > or = to 59    Baseline 49    Time 8    Period Weeks    Status New    Target Date 08/17/20      PT LONG TERM GOAL #3   Title report < or = to 4/10 thoracic pain after standing and walking for community and home tasks    Baseline 8-10/10    Time 8    Period Weeks    Status New    Target Date 08/17/20      PT LONG TERM GOAL #4   Title report a 70% reduction in the frequency of thoracic pain with ADLs and self-care    Baseline --    Time 8    Period Weeks    Status New    Target Date 08/17/20      PT LONG TERM GOAL #5   Title verbalize and demonstrate body mechanics and posture modifications to reduce strain on thoracic spine    Time 8    Period Weeks    Status New    Target Date 08/17/20                  Plan - 06/22/20 1626    Clinical Impression Statement Pt presents to  PT with thoracic pain that began 2-3 months ago without incident or injury.  X-ray showed DDD in the thoracic spine per pt report.  Pt reports "tightness" at rest and 8-10/10 when bending forward and returning to erect posture and after periods of standing or walking.  Pain is reduced with sitting, heat, muscle relaxers and pain patches.  Pt sits with poor seated posture including flexed trunk, rounded shoulder posture and forward head.  Pt with reduced thoracic segmental mobility with pain in lower thoracic spine and spasms in bil thoracic paraspinals.  Pt will benefit from skilled PT to address thoracic pain, segmental and tissue mobility and postural strength.  Personal Factors and Comorbidities Comorbidity 3+    Comorbidities HTN, bil total knee replacements and revision, chronic LBP    Examination-Activity Limitations Lift;Sit;Squat;Carry    Examination-Participation Restrictions Community Activity;Driving;Shop    Stability/Clinical Decision Making Stable/Uncomplicated    Clinical Decision Making Low    Rehab Potential Good    PT Frequency 2x / week    PT Duration 8 weeks    PT Treatment/Interventions ADLs/Self Care Home Management;Cryotherapy;Traction;Moist Heat;Electrical Stimulation;Functional mobility training;Neuromuscular re-education;Therapeutic exercise;Therapeutic activities;Patient/family education;Manual techniques;Passive range of motion;Dry needling;Taping;Spinal Manipulations;Joint Manipulations    PT Next Visit Plan review HEP, thoracic mobility, postural strength, manual to thoracic spine    PT Home Exercise Plan Access Code: [email or web address]    Consulted and Agree with Plan of Care Patient           Patient will benefit from skilled therapeutic intervention in order to improve the following deficits and impairments:  Decreased activity tolerance,Postural dysfunction,Pain,Improper body mechanics,Decreased range of motion,Decreased mobility,Hypomobility  Visit  Diagnosis: Pain in thoracic spine - Plan: PT plan of care cert/re-cert  Abnormal posture - Plan: PT plan of care cert/re-cert  Cramp and spasm - Plan: PT plan of care cert/re-cert     Problem List Patient Active Problem List   Diagnosis Date Noted  . S/P revision of total knee, left 08/15/2019  . Preop cardiovascular exam 08/13/2019  . Osteoarthritis of ankle, right 01/29/2018  . Disorder of tendon of posterior tibial muscle 07/06/2017  . Sleep related headaches 09/07/2016  . Nightmares REM-sleep type 09/07/2016  . RLS (restless legs syndrome) 09/07/2016  . Snoring 09/07/2016  . Excessive daytime sleepiness 09/07/2016  . Migraine 07/28/2016  . Solitary pulmonary nodule 06/08/2016  . S/P knee replacement 05/05/2015  . Former smoker 07/10/2014  . Shortness of breath 06/07/2014  . Chest pain 06/07/2014  . Osteoarthritis 05/16/2014  . Alcoholic fatty liver 24/23/5361  . Chronic post-traumatic stress disorder (PTSD) 05/02/2014  . Sleep disorder 05/02/2014  . Family history of alcoholism 05/02/2014  . Macrocytosis 04/19/2014  . Fatigue 08/15/2013  . Thyroid nodule 09/17/2012  . Hypothyroid 07/21/2010  . Type II diabetes mellitus, well controlled ([place]) 06/23/2010  . Hypertension associated with diabetes ([place]) 06/23/2010  . Alcohol use disorder, severe, in sustained remission ([place]) 06/23/2010  . Hyperlipidemia associated with type 2 diabetes mellitus ([place]) 06/23/2010  . Morbid obesity ([place]) 06/23/2010  . Chronic depression 06/23/2010  . History of colonic polyps 04/12/2010     Sigurd Sos, PT 06/22/20 4:36 PM  [facility] [address], [place], [place], [address] Phone: [phone]   Fax:  [phone]  Name: [name] [name] MRN: [id number] Date of Birth: Jul 03, 1955

## 2020-06-23 NOTE — Telephone Encounter (Signed)
LVM asking patient to call back and get scheduled. 

## 2020-06-24 ENCOUNTER — Encounter: Payer: Self-pay | Admitting: Physical Therapy

## 2020-06-24 ENCOUNTER — Other Ambulatory Visit: Payer: Self-pay

## 2020-06-24 ENCOUNTER — Ambulatory Visit: Payer: Medicare Other | Admitting: Physical Therapy

## 2020-06-24 DIAGNOSIS — R252 Cramp and spasm: Secondary | ICD-10-CM

## 2020-06-24 DIAGNOSIS — M546 Pain in thoracic spine: Secondary | ICD-10-CM | POA: Diagnosis not present

## 2020-06-24 DIAGNOSIS — R293 Abnormal posture: Secondary | ICD-10-CM

## 2020-06-24 NOTE — Therapy (Signed)
[facility] [address] [place], [place], [address] Phone: [phone]   Fax:  [phone]  Physical Therapy Treatment  Patient Details  Name: [name] [name] MRN: [id number] Date of Birth: [birth year] Referring Provider (PT): Ward, [place], [place]   Encounter Date: 06/24/2020   PT End of Session - 06/24/20 1354    Visit Number 2    Date for PT Re-Evaluation 08/17/20    Authorization Type Medicare/AARP- KX at 15    PT Start Time 8185    PT Stop Time 1438    PT Time Calculation (min) 43 min    Activity Tolerance Patient tolerated treatment well    Behavior During Therapy [facility] for tasks assessed/performed           Past Medical History:  Diagnosis Date  . Acute meniscal tear of knee    Left knee  . Alcohol problem drinking    rehab  . Anemia    menstrual related  . Anxiety   . Arthritis    right ankle-neuropathy  . Colon polyps   . Depression   . DM (diabetes mellitus), type 2 ([place]) 12/2009   type 2  . Dyspnea   . Evalluate for OSA (obstructive sleep apnea) 08/15/2013   No OSA on sleep study but may be underestimated.-never used cpap    . GERD (gastroesophageal reflux disease)    not currently taking.  Marland Kitchen Hyperlipidemia   . Hypertension   . Hypothyroidism   . Liver disease   . Neuromuscular disorder ([place])    neropathy bilateral feet.  . Thyroid disease     Past Surgical History:  Procedure Laterality Date  . ADENOIDECTOMY    . BREAST SURGERY Right    lumpectomy-benign  . [place]   1 time  . CHOLECYSTECTOMY    . HAMMER TOE SURGERY     Dr. Doran Durand [facility]- feb 2022  . HAMMERTOE RECONSTRUCTION WITH WEIL OSTEOTOMY Bilateral 04/02/2020   Procedure: Bilateral 2-3 Weil and hammertoe corrections;  Surgeon: Wylene Simmer, MD;  Location: [facility];  Service: Orthopedics;  Laterality: Bilateral;  . INNER EAR SURGERY     left ear had tubes put in and removed, scar tissue  built up/ loss of hearing in left ear for over a year  . MASS EXCISION Left 12/16/2013   Procedure: EXCISION LEFT  DISTAL THIGH MASS;  Surgeon: Mauri Pole, MD;  [facility];  Service: Orthopedics;  Laterality: Left;  . right ankle surgery      x 2- no retained hardware  . right rotator cuff surgery      left side  . TONSILLECTOMY    . TOTAL KNEE ARTHROPLASTY  2009   right  . TOTAL KNEE ARTHROPLASTY Left 05/05/2015   Procedure: LEFT TOTAL KNEE ARTHROPLASTY;  Surgeon: Paralee Cancel, MD;  [facility];  Service: Orthopedics;  Laterality: Left;  . TOTAL KNEE REVISION Right 12/16/2013   Procedure: RIGHT TOTAL KNEE REVISION POLY EXCHANGE;  Surgeon: Mauri Pole, MD;  [facility];  Service: Orthopedics;  Laterality: Right;  . TOTAL KNEE REVISION Left 08/15/2019   Procedure: LEFT KNEE REVISION;  Surgeon: Paralee Cancel, MD;  [facility];  Service: Orthopedics;  Laterality: Left;  2 hrs  . TUBAL LIGATION  1987    There were no vitals filed for this visit.   Subjective Assessment - 06/24/20 1356    Subjective I'm sore today. 4/10    Pertinent History  chronic LBP, DM, HTN, TKA bil with revision.    Currently in Pain? Yes    Pain Score 4     Pain Location Back    Pain Orientation Mid    Pain Descriptors / Indicators Sore    Multiple Pain Sites No                             [facility] Adult PT Treatment/Exercise - 06/24/20 0001      Therapeutic Activites    Therapeutic Activities ADL's   Body mechanics with ADLs/posture     Shoulder Exercises: Seated   Other Seated Exercises thoracic rotation 10x   Supine LTR Bil 10x   Other Seated Exercises Scap retraction 10x      Shoulder Exercises: ROM/Strengthening   UBE (Upper Arm Bike) L1 x 3 min reverse with discussion of status      Shoulder Exercises: Stretch   Other Shoulder Stretches Towel roll at thoracic spine; stretch with breathing 2 min      Manual Therapy   Myofascial Release Thoracic spine in  sidelying                  PT Education - 06/24/20 1441    Education Details Body mechanics training for [employer]) Educated Patient    Methods Explanation;Demonstration;Handout    Comprehension Verbalized understanding            PT Short Term Goals - 06/22/20 1528      PT SHORT TERM GOAL #1   Title be independent in initial HEP    Time 4    Period Weeks    Status New    Target Date 07/20/20      PT SHORT TERM GOAL #2   Title report a 30% reduction in thoracic pain with self-care and ADLs    Baseline ---    Time 4    Period Weeks    Status New    Target Date 07/20/20             PT Long Term Goals - 06/22/20 1529      PT LONG TERM GOAL #1   Title be independent in advanced HEP    Time 8    Period Weeks    Status New    Target Date 08/17/20      PT LONG TERM GOAL #2   Title FOTO improved to > or = to 59    Baseline 49    Time 8    Period Weeks    Status New    Target Date 08/17/20      PT LONG TERM GOAL #3   Title report < or = to 4/10 thoracic pain after standing and walking for community and home tasks    Baseline 8-10/10    Time 8    Period Weeks    Status New    Target Date 08/17/20      PT LONG TERM GOAL #4   Title report a 70% reduction in the frequency of thoracic pain with ADLs and self-care    Baseline --    Time 8    Period Weeks    Status New    Target Date 08/17/20      PT LONG TERM GOAL #5   Title verbalize and demonstrate body mechanics and posture modifications to reduce strain on thoracic spine    Time 8    Period  Weeks    Status New    Target Date 08/17/20                 Plan - 06/24/20 1421    Clinical Impression Statement Pt arrives with 4/10 mid back pain. Pt states she has not done any of her HEP yet. She was able to demo/perform all correctly during todays visit. Pt was educated in ADL body mechanics and postural review. Pt appears to have reduced fascial gliding along her mid back.     Personal Factors and Comorbidities Comorbidity 3+    Comorbidities HTN, bil total knee replacements and revision, chronic LBP    Examination-Activity Limitations Lift;Sit;Squat;Carry    Stability/Clinical Decision Making Stable/Uncomplicated    Rehab Potential Good    PT Frequency 2x / week    PT Duration 8 weeks    PT Treatment/Interventions ADLs/Self Care Home Management;Cryotherapy;Traction;Moist Heat;Electrical Stimulation;Functional mobility training;Neuromuscular re-education;Therapeutic exercise;Therapeutic activities;Patient/family education;Manual techniques;Passive range of motion;Dry needling;Taping;Spinal Manipulations;Joint Manipulations    PT Next Visit Plan review HEP, thoracic mobility, postural strength, manual to thoracic spine    PT Home Exercise Plan Access Code: [email or web address]    Consulted and Agree with Plan of Care Patient           Patient will benefit from skilled therapeutic intervention in order to improve the following deficits and impairments:  Decreased activity tolerance,Postural dysfunction,Pain,Improper body mechanics,Decreased range of motion,Decreased mobility,Hypomobility  Visit Diagnosis: Pain in thoracic spine  Abnormal posture  Cramp and spasm     Problem List Patient Active Problem List   Diagnosis Date Noted  . S/P revision of total knee, left 08/15/2019  . Preop cardiovascular exam 08/13/2019  . Osteoarthritis of ankle, right 01/29/2018  . Disorder of tendon of posterior tibial muscle 07/06/2017  . Sleep related headaches 09/07/2016  . Nightmares REM-sleep type 09/07/2016  . RLS (restless legs syndrome) 09/07/2016  . Snoring 09/07/2016  . Excessive daytime sleepiness 09/07/2016  . Migraine 07/28/2016  . Solitary pulmonary nodule 06/08/2016  . S/P knee replacement 05/05/2015  . Former smoker 07/10/2014  . Shortness of breath 06/07/2014  . Chest pain 06/07/2014  . Osteoarthritis 05/16/2014  . Alcoholic fatty liver 90/24/0973   . Chronic post-traumatic stress disorder (PTSD) 05/02/2014  . Sleep disorder 05/02/2014  . Family history of alcoholism 05/02/2014  . Macrocytosis 04/19/2014  . Fatigue 08/15/2013  . Thyroid nodule 09/17/2012  . Hypothyroid 07/21/2010  . Type II diabetes mellitus, well controlled ([place]) 06/23/2010  . Hypertension associated with diabetes ([place]) 06/23/2010  . Alcohol use disorder, severe, in sustained remission ([place]) 06/23/2010  . Hyperlipidemia associated with type 2 diabetes mellitus ([place]) 06/23/2010  . Morbid obesity ([place]) 06/23/2010  . Chronic depression 06/23/2010  . History of colonic polyps 04/12/2010    [name] [name], PTA 06/24/2020, 2:49 PM   [facility] [address], [place], [place], [address] Phone: [phone]   Fax:  [phone]  Name: [name] [name] MRN: [id number] Date of Birth: 1955-05-06

## 2020-06-24 NOTE — Patient Instructions (Signed)
   Lifting Principles  .Maintain proper posture and head alignment. .Slide object as close as possible before lifting. .Move obstacles out of the way. .Test before lifting; ask for help if too heavy. .Tighten stomach muscles without holding breath. .Use smooth movements; do not jerk. .Use legs to do the work, and pivot with feet. .Distribute the work load symmetrically and close to the center of trunk. .Push instead of pull whenever possible.   Squat down and hold basket close to stand. Use leg muscles to do the work.    Avoid twisting or bending back. Pivot around using foot movements, and bend at knees if needed when reaching for articles.        Getting Into / Out of Bed   Lower self to lie down on one side by raising legs and lowering head at the same time. Use arms to assist moving without twisting. Bend both knees to roll onto back if desired. To sit up, start from lying on side, and use same move-ments in reverse. Keep trunk aligned with legs.    Shift weight from front foot to back foot as item is lifted off shelf.    When leaning forward to pick object up from floor, extend one leg out behind. Keep back straight. Hold onto a sturdy support with other hand.      Sit upright, head facing forward. Try using a roll to support lower back. Keep shoulders relaxed, and avoid rounded back. Keep hips level with knees. Avoid crossing legs for long periods.     

## 2020-06-29 ENCOUNTER — Telehealth (HOSPITAL_COMMUNITY): Payer: Self-pay | Admitting: Cardiology

## 2020-06-29 ENCOUNTER — Other Ambulatory Visit: Payer: Self-pay

## 2020-06-29 ENCOUNTER — Ambulatory Visit: Payer: Medicare Other

## 2020-06-29 DIAGNOSIS — M546 Pain in thoracic spine: Secondary | ICD-10-CM

## 2020-06-29 DIAGNOSIS — R293 Abnormal posture: Secondary | ICD-10-CM

## 2020-06-29 DIAGNOSIS — R252 Cramp and spasm: Secondary | ICD-10-CM

## 2020-06-29 NOTE — Telephone Encounter (Signed)
Called to schedule echocardiogram for June and patient state she will be out of town for the summer and she will discuss with Dr. Percival Spanish on her visit with hi 07/02/20.

## 2020-06-29 NOTE — Telephone Encounter (Signed)
Forwarded to Dr. Percival Spanish.

## 2020-06-29 NOTE — Telephone Encounter (Signed)
Patient has been scheduled

## 2020-06-29 NOTE — Therapy (Signed)
[facility] [address], [place], [place], [address] Phone: [phone]   Fax:  [phone]  Physical Therapy Treatment  Patient Details  Name: [name] [name] MRN: [id number] Date of Birth: [birth year] Referring Provider (PT): Ward, [place], [place]   Encounter Date: 06/29/2020   PT End of Session - 06/29/20 1656    Visit Number 3    Date for PT Re-Evaluation 08/17/20    Authorization Type Medicare/AARP- KX at 15    PT Start Time 1617    PT Stop Time 1654    PT Time Calculation (min) 37 min    Activity Tolerance Patient tolerated treatment well    Behavior During Therapy [facility] for tasks assessed/performed           Past Medical History:  Diagnosis Date  . Acute meniscal tear of knee    Left knee  . Alcohol problem drinking    rehab  . Anemia    menstrual related  . Anxiety   . Arthritis    right ankle-neuropathy  . Colon polyps   . Depression   . DM (diabetes mellitus), type 2 ([place]) 12/2009   type 2  . Dyspnea   . Evalluate for OSA (obstructive sleep apnea) 08/15/2013   No OSA on sleep study but may be underestimated.-never used cpap    . GERD (gastroesophageal reflux disease)    not currently taking.  Marland Kitchen Hyperlipidemia   . Hypertension   . Hypothyroidism   . Liver disease   . Neuromuscular disorder ([place])    neropathy bilateral feet.  . Thyroid disease     Past Surgical History:  Procedure Laterality Date  . ADENOIDECTOMY    . BREAST SURGERY Right    lumpectomy-benign  . [place]   1 time  . CHOLECYSTECTOMY    . HAMMER TOE SURGERY     Dr. Doran [facility]- feb 2022  . HAMMERTOE RECONSTRUCTION WITH WEIL OSTEOTOMY Bilateral 04/02/2020   Procedure: Bilateral 2-3 Weil and hammertoe corrections;  Surgeon: Wylene Simmer, MD;  Location: [facility];  Service: Orthopedics;  Laterality: Bilateral;  . INNER EAR SURGERY     left ear had tubes put in and removed, scar tissue  built up/ loss of hearing in left ear for over a year  . MASS EXCISION Left 12/16/2013   Procedure: EXCISION LEFT  DISTAL THIGH MASS;  Surgeon: Mauri Pole, MD;  [facility];  Service: Orthopedics;  Laterality: Left;  . right ankle surgery      x 2- no retained hardware  . right rotator cuff surgery      left side  . TONSILLECTOMY    . TOTAL KNEE ARTHROPLASTY  2009   right  . TOTAL KNEE ARTHROPLASTY Left 05/05/2015   Procedure: LEFT TOTAL KNEE ARTHROPLASTY;  Surgeon: Paralee Cancel, MD;  [facility];  Service: Orthopedics;  Laterality: Left;  . TOTAL KNEE REVISION Right 12/16/2013   Procedure: RIGHT TOTAL KNEE REVISION POLY EXCHANGE;  Surgeon: Mauri Pole, MD;  [facility];  Service: Orthopedics;  Laterality: Right;  . TOTAL KNEE REVISION Left 08/15/2019   Procedure: LEFT KNEE REVISION;  Surgeon: Paralee Cancel, MD;  Location: [facility];  Service: Orthopedics;  Laterality: Left;  2 hrs  . TUBAL LIGATION  1987    There were no vitals filed for this visit.   Subjective Assessment - 06/29/20 1623    Subjective I am sore today. I was just planting flowers.  Currently in Pain? Yes    Pain Score 4     Pain Location Back    Pain Orientation Upper;Mid    Pain Descriptors / Indicators Sore    Pain Onset More than a month ago    Pain Frequency Constant    Aggravating Factors  activity, planting flowers    Pain Relieving Factors sitting down, muscle relaxer, heat, pain patches                             [facility] Adult PT Treatment/Exercise - 06/29/20 0001      Exercises   Exercises Lumbar      Lumbar Exercises: Stretches   Lower Trunk Rotation 3 reps;20 seconds    Other Lumbar Stretch Exercise open book stretch x10 bil each      Shoulder Exercises: Seated   Other Seated Exercises Scap retraction 10x      Shoulder Exercises: ROM/Strengthening   UBE (Upper Arm Bike) Level 1x 6 minutes-PT present to discuss progress      Manual Therapy   Manual  Therapy Soft tissue mobilization    Manual therapy comments skilled palpation and monitoring/assessment with dry needling    Soft tissue mobilization elongation and joint mobs-thoracic spine            Trigger Point Dry Needling - 06/29/20 0001    Consent Given? Yes    Education Handout Provided Yes    Dry Needling Comments thoracic multifidi                PT Education - 06/29/20 1634    Education Details DN    Person(s) Educated Patient    Methods Explanation;Handout    Comprehension Verbalized understanding            PT Short Term Goals - 06/22/20 1528      PT SHORT TERM GOAL #1   Title be independent in initial HEP    Time 4    Period Weeks    Status New    Target Date 07/20/20      PT SHORT TERM GOAL #2   Title report a 30% reduction in thoracic pain with self-care and ADLs    Baseline ---    Time 4    Period Weeks    Status New    Target Date 07/20/20             PT Long Term Goals - 06/22/20 1529      PT LONG TERM GOAL #1   Title be independent in advanced HEP    Time 8    Period Weeks    Status New    Target Date 08/17/20      PT LONG TERM GOAL #2   Title FOTO improved to > or = to 59    Baseline 49    Time 8    Period Weeks    Status New    Target Date 08/17/20      PT LONG TERM GOAL #3   Title report < or = to 4/10 thoracic pain after standing and walking for community and home tasks    Baseline 8-10/10    Time 8    Period Weeks    Status New    Target Date 08/17/20      PT LONG TERM GOAL #4   Title report a 70% reduction in the frequency of thoracic pain with ADLs and self-care  Baseline --    Time 8    Period Weeks    Status New    Target Date 08/17/20      PT LONG TERM GOAL #5   Title verbalize and demonstrate body mechanics and posture modifications to reduce strain on thoracic spine    Time 8    Period Weeks    Status New    Target Date 08/17/20                 Plan - 06/29/20 1655    Clinical  Impression Statement Pt reports 30% reduction in the intensity of her thoracic pain.  No change in frequency.  Pt has been making body mechanics changes and modifications at home to improve alignment and reduce strain to the thoracic spine with daily tasks. Pt reports moderate compliance with HEP.  Session focused on postural strength and flexibility and tissue/segmental mobility in the thoracic spine.  Pt with good response to dry needling with deep ache in multifidi.  Pt with reduced segmental mobility in the thoracic spine and responded well to mobilizations.  Pt will continue to benefit from skilled PT to address thoracic pain, posture and flexibility.    PT Frequency 2x / week    PT Duration 8 weeks    PT Treatment/Interventions ADLs/Self Care Home Management;Cryotherapy;Traction;Moist Heat;Electrical Stimulation;Functional mobility training;Neuromuscular re-education;Therapeutic exercise;Therapeutic activities;Patient/family education;Manual techniques;Passive range of motion;Dry needling;Taping;Spinal Manipulations;Joint Manipulations    PT Next Visit Plan review HEP, thoracic mobility, postural strength, manual to thoracic spine    PT Home Exercise Plan Access Code: [email or web address]    Consulted and Agree with Plan of Care Patient           Patient will benefit from skilled therapeutic intervention in order to improve the following deficits and impairments:  Decreased activity tolerance,Postural dysfunction,Pain,Improper body mechanics,Decreased range of motion,Decreased mobility,Hypomobility  Visit Diagnosis: Pain in thoracic spine  Abnormal posture  Cramp and spasm     Problem List Patient Active Problem List   Diagnosis Date Noted  . S/P revision of total knee, left 08/15/2019  . Preop cardiovascular exam 08/13/2019  . Osteoarthritis of ankle, right 01/29/2018  . Disorder of tendon of posterior tibial muscle 07/06/2017  . Sleep related headaches 09/07/2016  . Nightmares  REM-sleep type 09/07/2016  . RLS (restless legs syndrome) 09/07/2016  . Snoring 09/07/2016  . Excessive daytime sleepiness 09/07/2016  . Migraine 07/28/2016  . Solitary pulmonary nodule 06/08/2016  . S/P knee replacement 05/05/2015  . Former smoker 07/10/2014  . Shortness of breath 06/07/2014  . Chest pain 06/07/2014  . Osteoarthritis 05/16/2014  . Alcoholic fatty liver 46/27/0350  . Chronic post-traumatic stress disorder (PTSD) 05/02/2014  . Sleep disorder 05/02/2014  . Family history of alcoholism 05/02/2014  . Macrocytosis 04/19/2014  . Fatigue 08/15/2013  . Thyroid nodule 09/17/2012  . Hypothyroid 07/21/2010  . Type II diabetes mellitus, well controlled ([place]) 06/23/2010  . Hypertension associated with diabetes ([place]) 06/23/2010  . Alcohol use disorder, severe, in sustained remission ([place]) 06/23/2010  . Hyperlipidemia associated with type 2 diabetes mellitus ([place]) 06/23/2010  . Morbid obesity ([place]) 06/23/2010  . Chronic depression 06/23/2010  . History of colonic polyps 04/12/2010    Sigurd Sos, PT 06/29/20 4:58 PM  [facility] [address] [place], [place], [address] Phone: [phone]   Fax:  [phone]  Name: [name] [name] MRN: [id number] Date of Birth: [birth year]

## 2020-06-29 NOTE — Patient Instructions (Signed)

## 2020-07-01 ENCOUNTER — Other Ambulatory Visit: Payer: Self-pay

## 2020-07-01 ENCOUNTER — Ambulatory Visit: Payer: Medicare Other

## 2020-07-01 DIAGNOSIS — M546 Pain in thoracic spine: Secondary | ICD-10-CM | POA: Diagnosis not present

## 2020-07-01 DIAGNOSIS — R252 Cramp and spasm: Secondary | ICD-10-CM

## 2020-07-01 DIAGNOSIS — R293 Abnormal posture: Secondary | ICD-10-CM

## 2020-07-01 NOTE — Patient Instructions (Signed)
Access Code: [email or web address] URL: [email or web address] Date: 07/01/2020 Prepared by: Claiborne Billings  Exercises  Supine Shoulder Horizontal Abduction with Resistance - 2 x daily - 7 x weekly - 10 reps - 2 sets Supine Bilateral Shoulder External Rotation with Resistance - 2 x daily - 7 x weekly - 10 reps - 2 sets Supine PNF D2 Flexion with Resistance - 2 x daily - 7 x weekly - 10 reps - 2 sets

## 2020-07-01 NOTE — Progress Notes (Signed)
Cardiology Office Note   Date:  07/02/2020   ID:  [name] [name], DOB 1955/04/20, MRN [id number]  PCP:  Marin Olp, MD  Cardiologist:   Minus Breeding, MD Referring:  Marin Olp, MD  Chief Complaint  Patient presents with  . Shortness of Breath      History of Present Illness: [name] [name] is a 65 y.o. female who is referred by Marin Olp, MD for evaluation of SOB and preop evaluation prior to knee surgery.    The patient has a previous history of diabetes.  She saw Dr. Claiborne Billings in 2016.  She apparently was seen preoperatively at that time as well.  I was able to retrieve.  Treadmill stress test which was negative.  She did have some shortness of breath at that time.  She had some mild aortic stenosis recorded previously but not documented in follow-up.  Echo in 2019 demonstrated NL EF.     Since I last saw her she is done relatively well.  She is not able to be as active as she would like because she is also had some foot surgery.  She is having some back problems.  With her activities that she is doing she gets some shortness of breath bending over but she thinks this is because of a muscular issue.  However, she is not describing any dyspnea on exertion and no PND or orthopnea.  Said no palpitations, presyncope or syncope.  Has had no edema..    Past Medical History:  Diagnosis Date  . Acute meniscal tear of knee    Left knee  . Alcohol problem drinking    rehab  . Anemia    menstrual related  . Anxiety   . Arthritis    right ankle-neuropathy  . Colon polyps   . Depression   . DM (diabetes mellitus), type 2 ([place]) 12/2009   type 2  . Dyspnea   . Evalluate for OSA (obstructive sleep apnea) 08/15/2013   No OSA on sleep study but may be underestimated.-never used cpap    . GERD (gastroesophageal reflux disease)    not currently taking.  Marland Kitchen Hyperlipidemia   . Hypertension   . Hypothyroidism   . Liver disease   . Neuromuscular disorder ([place])     neropathy bilateral feet.  . Thyroid disease     Past Surgical History:  Procedure Laterality Date  . ADENOIDECTOMY    . BREAST SURGERY Right    lumpectomy-benign  . [place]   1 time  . CHOLECYSTECTOMY    . HAMMER TOE SURGERY     Dr. Doran Durand [facility]- feb 2022  . HAMMERTOE RECONSTRUCTION WITH WEIL OSTEOTOMY Bilateral 04/02/2020   Procedure: Bilateral 2-3 Weil and hammertoe corrections;  Surgeon: Wylene Simmer, MD;  Location: [facility];  Service: Orthopedics;  Laterality: Bilateral;  . INNER EAR SURGERY     left ear had tubes put in and removed, scar tissue built up/ loss of hearing in left ear for over a year  . MASS EXCISION Left 12/16/2013   Procedure: EXCISION LEFT  DISTAL THIGH MASS;  Surgeon: Mauri Pole, MD;  [facility];  Service: Orthopedics;  Laterality: Left;  . right ankle surgery      x 2- no retained hardware  . right rotator cuff surgery      left side  . TONSILLECTOMY    . TOTAL KNEE ARTHROPLASTY  2009   right  . TOTAL KNEE  ARTHROPLASTY Left 05/05/2015   Procedure: LEFT TOTAL KNEE ARTHROPLASTY;  Surgeon: Paralee Cancel, MD;  [facility];  Service: Orthopedics;  Laterality: Left;  . TOTAL KNEE REVISION Right 12/16/2013   Procedure: RIGHT TOTAL KNEE REVISION POLY EXCHANGE;  Surgeon: Mauri Pole, MD;  [facility];  Service: Orthopedics;  Laterality: Right;  . TOTAL KNEE REVISION Left 08/15/2019   Procedure: LEFT KNEE REVISION;  Surgeon: Paralee Cancel, MD;  [facility];  Service: Orthopedics;  Laterality: Left;  2 hrs  . TUBAL LIGATION  1987     Current Outpatient Medications  Medication Sig Dispense Refill  . albuterol (PROVENTIL HFA;VENTOLIN HFA) 108 (90 Base) MCG/ACT inhaler Inhale 2 puffs into the lungs every 6 (six) hours as needed for wheezing or shortness of breath. 1 Inhaler 6  . aspirin EC 81 MG tablet Take 81 mg by mouth daily. Swallow whole.    . benazepril (LOTENSIN) 20 MG tablet Take 1 tablet (20 mg  total) by mouth daily. 90 tablet 3  . blood glucose meter kit and supplies KIT Dispense based on patient and insurance preference. Use up to four times daily as directed. DX E11.9 1 each 0  . buPROPion (WELLBUTRIN XL) 150 MG 24 hr tablet Take 1 tablet (150 mg total) by mouth daily. 90 tablet 3  . Cholecalciferol (VITAMIN D HIGH POTENCY) 25 MCG (1000 UT) capsule Vitamin D    . CRANBERRY PO Take by mouth.    . escitalopram (LEXAPRO) 20 MG tablet Take 1 tablet (20 mg total) by mouth daily. 90 tablet 3  . glucose blood (ONETOUCH VERIO) test strip USE TO CHECK BLOOD SUGAR DAILY AND PRN 100 each 12  . HYDROcodone-acetaminophen (NORCO/VICODIN) 5-325 MG tablet Take 1 tablet by mouth every 6 (six) hours as needed.    Marland Kitchen levothyroxine (SYNTHROID) 75 MCG tablet Take 1 tablet (75 mcg total) by mouth daily. 90 tablet 3  . metFORMIN (GLUCOPHAGE XR) 500 MG 24 hr tablet Take 2 tablets (1,000 mg total) by mouth in the morning and at bedtime. 360 tablet 3  . methocarbamol (ROBAXIN) 500 MG tablet methocarbamol 500 mg tablet  Take 1 tablet 3 times a day by oral route as needed.    . simvastatin (ZOCOR) 20 MG tablet Take 1 tablet (20 mg total) by mouth at bedtime. TAKE 1 TABLET BY MOUTH EVERYDAY AT BEDTIME 90 tablet 3  . tiZANidine (ZANAFLEX) 4 MG capsule Take 1 capsule (4 mg total) by mouth 3 (three) times daily as needed for muscle spasms. 40 capsule 0  . traZODone (DESYREL) 50 MG tablet Take 1 tablet (50 mg total) by mouth at bedtime. 90 tablet 3   No current facility-administered medications for this visit.    Allergies:   Adhesive [tape], Nitrofurantoin, and Other    ROS:  Please see the history of present illness.   Otherwise, review of systems are positive for none.   All other systems are reviewed and negative.    PHYSICAL EXAM: VS:  BP 130/70   Pulse 60   Ht '5\' 6"'  (1.676 m)   Wt 226 lb (102.5 kg)   SpO2 98%   BMI 36.48 kg/m  , BMI Body mass index is 36.48 kg/m. GENERAL:  Well appearing NECK:   No jugular venous distention, waveform within normal limits, carotid upstroke brisk and symmetric, left bruits, no thyromegaly LUNGS:  Clear to auscultation bilaterally CHEST:  Unremarkable HEART:  PMI not displaced or sustained,S1 and S2 within normal limits, no S3, no  S4, no clicks, no rubs, no murmurs ABD:  Flat, positive bowel sounds normal in frequency in pitch, no bruits, no rebound, no guarding, no midline pulsatile mass, no hepatomegaly, no splenomegaly EXT:  2 plus pulses throughout, no edema, no cyanosis no clubbing   EKG:  EKG is not  ordered today. The ekg ordered today demonstrates sinus rhythm, rate 60, axis within normal limits, intervals within normal limits, no acute ST-T wave changes.   Recent Labs: 04/08/2020: ALT 8; BUN 12; Creatinine, Ser 0.93; Hemoglobin 15.7; Platelets 258; Potassium 4.3; Sodium 140 04/17/2020: TSH 3.620    Lipid Panel    Component Value Date/Time   CHOL 169 04/08/2020 1517   TRIG 88 04/08/2020 1517   HDL 66 04/08/2020 1517   CHOLHDL 2.6 04/08/2020 1517   CHOLHDL 2.0 01/25/2019 1602   VLDL 12.2 05/17/2016 1305   LDLCALC 87 04/08/2020 1517   LDLCALC 46 01/25/2019 1602   LDLDIRECT 158.6 02/26/2014 1026      Wt Readings from Last 3 Encounters:  07/02/20 226 lb (102.5 kg)  04/13/20 230 lb (104.3 kg)  04/02/20 234 lb 5.6 oz (106.3 kg)      Other studies Reviewed: Additional studies/ records that were reviewed today include: MRI 2017 Review of the above records demonstrates:  Please see elsewhere in the note.     ASSESSMENT AND PLAN:  SOB:   She has had no new shortness of breath.  No further work-up is suggested.   MURMUR:   There were no significant abnormalities on echo. In 2019.  I do not really appreciate a significant murmur today.  No change in therapy.   DM: Her A1c is usually in the fives.  Most recently was 5.3.  I will defer to Marin Olp, MD   DYSLIPIDEMIA: Her lipids are LDL 87 and HDL 66.   BRUIT: She does have  a left bruit.  She did not have any significant disease in 2017 when she had a Doppler.  This was prompted by an MRI that suggested an old stroke possibly.  I will repeat a carotid Doppler.    Current medicines are reviewed at length with the patient today.  The patient does not have concerns regarding medicines.  The following changes have been made:  None  Labs/ tests ordered today include:  Orders Placed This Encounter  Procedures  . EKG 12-Lead  . VAS US CAROTID     Follow-up with me as needed   Signed, Minus Breeding, MD  07/02/2020 1:12 PM    [facility]

## 2020-07-01 NOTE — Therapy (Signed)
[facility] [address] [place], [place], [address] Phone: [phone]   Fax:  [phone]  Physical Therapy Treatment  Patient Details  Name: [name] [name] MRN: [id number] Date of Birth: [birth year] Referring Provider (PT): Ward, [place], [place]   Encounter Date: 07/01/2020   PT End of Session - 07/01/20 1519    Visit Number 4    Date for PT Re-Evaluation 08/17/20    Authorization Type Medicare/AARP- KX at 15    PT Start Time 1446    PT Stop Time 1518    PT Time Calculation (min) 32 min    Activity Tolerance Patient tolerated treatment well    Behavior During Therapy [facility] for tasks assessed/performed           Past Medical History:  Diagnosis Date  . Acute meniscal tear of knee    Left knee  . Alcohol problem drinking    rehab  . Anemia    menstrual related  . Anxiety   . Arthritis    right ankle-neuropathy  . Colon polyps   . Depression   . DM (diabetes mellitus), type 2 ([place]) 12/2009   type 2  . Dyspnea   . Evalluate for OSA (obstructive sleep apnea) 08/15/2013   No OSA on sleep study but may be underestimated.-never used cpap    . GERD (gastroesophageal reflux disease)    not currently taking.  Marland Kitchen Hyperlipidemia   . Hypertension   . Hypothyroidism   . Liver disease   . Neuromuscular disorder ([place])    neropathy bilateral feet.  . Thyroid disease     Past Surgical History:  Procedure Laterality Date  . ADENOIDECTOMY    . BREAST SURGERY Right    lumpectomy-benign  . [place]   1 time  . CHOLECYSTECTOMY    . HAMMER TOE SURGERY     Dr. Doran Durand [facility]- feb 2022  . HAMMERTOE RECONSTRUCTION WITH WEIL OSTEOTOMY Bilateral 04/02/2020   Procedure: Bilateral 2-3 Weil and hammertoe corrections;  Surgeon: Wylene Simmer, MD;  Location: [place];  Service: Orthopedics;  Laterality: Bilateral;  . INNER EAR SURGERY     left ear had tubes put in and removed, scar tissue  built up/ loss of hearing in left ear for over a year  . MASS EXCISION Left 12/16/2013   Procedure: EXCISION LEFT  DISTAL THIGH MASS;  Surgeon: Mauri Pole, MD;  Location: [facility];  Service: Orthopedics;  Laterality: Left;  . right ankle surgery      x 2- no retained hardware  . right rotator cuff surgery      left side  . TONSILLECTOMY    . TOTAL KNEE ARTHROPLASTY  2009   right  . TOTAL KNEE ARTHROPLASTY Left 05/05/2015   Procedure: LEFT TOTAL KNEE ARTHROPLASTY;  Surgeon: Paralee Cancel, MD;  Location: [facility];  Service: Orthopedics;  Laterality: Left;  . TOTAL KNEE REVISION Right 12/16/2013   Procedure: RIGHT TOTAL KNEE REVISION POLY EXCHANGE;  Surgeon: Mauri Pole, MD;  Location: [facility];  Service: Orthopedics;  Laterality: Right;  . TOTAL KNEE REVISION Left 08/15/2019   Procedure: LEFT KNEE REVISION;  Surgeon: Paralee Cancel, MD;  Location: [facility];  Service: Orthopedics;  Laterality: Left;  2 hrs  . TUBAL LIGATION  1987    There were no vitals filed for this visit.   Subjective Assessment - 07/01/20 1449    Subjective I was a little sore after dry needling but  it wasn't bad.  The spasms havent been as bad.    Currently in Pain? No/denies                             [facility] Adult PT Treatment/Exercise - 07/01/20 0001      Shoulder Exercises: Supine   Horizontal ABduction Strengthening;Both;Theraband    Theraband Level (Shoulder Horizontal ABduction) Level 2 (Red)    External Rotation Strengthening;20 reps;Theraband    Theraband Level (Shoulder External Rotation) Level 2 (Red)    Diagonals Strengthening;20 reps;Theraband    Theraband Level (Shoulder Diagonals) Level 2 (Red)      Shoulder Exercises: Sidelying   Other Sidelying Exercises open book x10 bil each      Shoulder Exercises: ROM/Strengthening   UBE (Upper Arm Bike) Level 1x 6 minutes-PT present to discuss progress                  PT Education - 07/01/20 1514    Education Details Access  Code: [email or web address]    Person(s) Educated Patient    Methods Explanation;Demonstration    Comprehension Returned demonstration;Verbalized understanding            PT Short Term Goals - 07/01/20 1453      PT SHORT TERM GOAL #1   Title be independent in initial HEP    Baseline becoming more consistent    Status On-going      PT SHORT TERM GOAL #2   Title report a 30% reduction in thoracic pain with self-care and ADLs    Status On-going      PT SHORT TERM GOAL #3   Title --             PT Long Term Goals - 06/22/20 1529      PT LONG TERM GOAL #1   Title be independent in advanced HEP    Time 8    Period Weeks    Status New    Target Date 08/17/20      PT LONG TERM GOAL #2   Title FOTO improved to > or = to 59    Baseline 49    Time 8    Period Weeks    Status New    Target Date 08/17/20      PT LONG TERM GOAL #3   Title report < or = to 4/10 thoracic pain after standing and walking for community and home tasks    Baseline 8-10/10    Time 8    Period Weeks    Status New    Target Date 08/17/20      PT LONG TERM GOAL #4   Title report a 70% reduction in the frequency of thoracic pain with ADLs and self-care    Baseline --    Time 8    Period Weeks    Status New    Target Date 08/17/20      PT LONG TERM GOAL #5   Title verbalize and demonstrate body mechanics and posture modifications to reduce strain on thoracic spine    Time 8    Period Weeks    Status New    Target Date 08/17/20                 Plan - 07/01/20 1502    Clinical Impression Statement Pt responded well to dry needling last session.  She reports fewer episodes of thoracic spasms with bending forward since last session.  Pt has been more consistent with HEP for flexibility.  PT added to HEP to include thoracic/postural strength exercises.  Pt required intermittent tactile and verbal cues to reduce upper trap activation with these exercises.  Pt will continue to benefit from skilled PT  to address thoracic pain, postural strength and segmental/tissue mobility.    PT Frequency 2x / week    PT Duration 8 weeks    PT Treatment/Interventions ADLs/Self Care Home Management;Cryotherapy;Traction;Moist Heat;Electrical Stimulation;Functional mobility training;Neuromuscular re-education;Therapeutic exercise;Therapeutic activities;Patient/family education;Manual techniques;Passive range of motion;Dry needling;Taping;Spinal Manipulations;Joint Manipulations    PT Next Visit Plan review new HEP, DN to thoracic spine again with tissue mobilizations and elogation of paraspinals    PT Home Exercise Plan Access Code: [email or web address]    Consulted and Agree with Plan of Care Patient           Patient will benefit from skilled therapeutic intervention in order to improve the following deficits and impairments:  Decreased activity tolerance,Postural dysfunction,Pain,Improper body mechanics,Decreased range of motion,Decreased mobility,Hypomobility  Visit Diagnosis: Pain in thoracic spine  Abnormal posture  Cramp and spasm     Problem List Patient Active Problem List   Diagnosis Date Noted  . S/P revision of total knee, left 08/15/2019  . Preop cardiovascular exam 08/13/2019  . Osteoarthritis of ankle, right 01/29/2018  . Disorder of tendon of posterior tibial muscle 07/06/2017  . Sleep related headaches 09/07/2016  . Nightmares REM-sleep type 09/07/2016  . RLS (restless legs syndrome) 09/07/2016  . Snoring 09/07/2016  . Excessive daytime sleepiness 09/07/2016  . Migraine 07/28/2016  . Solitary pulmonary nodule 06/08/2016  . S/P knee replacement 05/05/2015  . Former smoker 07/10/2014  . Shortness of breath 06/07/2014  . Chest pain 06/07/2014  . Osteoarthritis 05/16/2014  . Alcoholic fatty liver 28/78/6767  . Chronic post-traumatic stress disorder (PTSD) 05/02/2014  . Sleep disorder 05/02/2014  . Family history of alcoholism 05/02/2014  . Macrocytosis 04/19/2014  .  Fatigue 08/15/2013  . Thyroid nodule 09/17/2012  . Hypothyroid 07/21/2010  . Type II diabetes mellitus, well controlled ([place]) 06/23/2010  . Hypertension associated with diabetes ([place]) 06/23/2010  . Alcohol use disorder, severe, in sustained remission ([place]) 06/23/2010  . Hyperlipidemia associated with type 2 diabetes mellitus ([place]) 06/23/2010  . Morbid obesity ([place]) 06/23/2010  . Chronic depression 06/23/2010  . History of colonic polyps 04/12/2010    Sigurd Sos, PT 07/01/20 3:22 PM  [facility] [address], [place], [place], [address] Phone: [phone]   Fax:  [phone]  Name: [name] [name] MRN: [id number] Date of Birth: 1955-03-02

## 2020-07-02 ENCOUNTER — Encounter: Payer: Self-pay | Admitting: Cardiology

## 2020-07-02 ENCOUNTER — Ambulatory Visit (INDEPENDENT_AMBULATORY_CARE_PROVIDER_SITE_OTHER): Payer: Medicare Other | Admitting: Cardiology

## 2020-07-02 VITALS — BP 130/70 | HR 60 | Ht 66.0 in | Wt 226.0 lb

## 2020-07-02 DIAGNOSIS — R0989 Other specified symptoms and signs involving the circulatory and respiratory systems: Secondary | ICD-10-CM | POA: Diagnosis not present

## 2020-07-02 DIAGNOSIS — R0602 Shortness of breath: Secondary | ICD-10-CM | POA: Diagnosis not present

## 2020-07-02 NOTE — Progress Notes (Signed)
Phone [phone] In person visit   Subjective:   [name] [name] is a 65 y.o. year old very pleasant female patient who presents for/with See problem oriented charting Chief Complaint  Patient presents with  . Cyst    Left leg, Shoot pains intermittent. Patient noticed it a couple weeks ago and it has gotten smaller.    This visit occurred during the SARS-CoV-2 public health emergency.  Safety protocols were in place, including screening questions prior to the visit, additional usage of staff PPE, and extensive cleaning of exam room while observing appropriate contact time as indicated for disinfecting solutions.   Past Medical History-  Patient Active Problem List   Diagnosis Date Noted  . Solitary pulmonary nodule 06/08/2016    Priority: High  . Alcoholic fatty liver 39/76/7341    Priority: High  . Type II diabetes mellitus, well controlled ([place]) 06/23/2010    Priority: High  . Alcohol use disorder, severe, in sustained remission ([place]) 06/23/2010    Priority: High  . Morbid obesity ([place]) 06/23/2010    Priority: High  . Chronic depression 06/23/2010    Priority: High  . Migraine 07/28/2016    Priority: Medium  . Hypothyroid 07/21/2010    Priority: Medium  . Hypertension associated with diabetes ([place]) 06/23/2010    Priority: Medium  . Hyperlipidemia associated with type 2 diabetes mellitus ([place]) 06/23/2010    Priority: Medium  . S/P knee replacement 05/05/2015    Priority: Low  . Former smoker 07/10/2014    Priority: Low  . Osteoarthritis 05/16/2014    Priority: Low  . Chronic post-traumatic stress disorder (PTSD) 05/02/2014    Priority: Low  . Sleep disorder 05/02/2014    Priority: Low  . Family history of alcoholism 05/02/2014    Priority: Low  . Macrocytosis 04/19/2014    Priority: Low  . Fatigue 08/15/2013    Priority: Low  . Thyroid nodule 09/17/2012    Priority: Low  . History of colonic polyps 04/12/2010    Priority: Low  . S/P revision of total knee,  left 08/15/2019  . Preop cardiovascular exam 08/13/2019  . Osteoarthritis of ankle, right 01/29/2018  . Disorder of tendon of posterior tibial muscle 07/06/2017  . Sleep related headaches 09/07/2016  . Nightmares REM-sleep type 09/07/2016  . RLS (restless legs syndrome) 09/07/2016  . Snoring 09/07/2016  . Excessive daytime sleepiness 09/07/2016  . Shortness of breath 06/07/2014  . Chest pain 06/07/2014    Medications- reviewed and updated Current Outpatient Medications  Medication Sig Dispense Refill  . albuterol (PROVENTIL HFA;VENTOLIN HFA) 108 (90 Base) MCG/ACT inhaler Inhale 2 puffs into the lungs every 6 (six) hours as needed for wheezing or shortness of breath. 1 Inhaler 6  . aspirin EC 81 MG tablet Take 81 mg by mouth daily. Swallow whole.    . benazepril (LOTENSIN) 20 MG tablet Take 1 tablet (20 mg total) by mouth daily. 90 tablet 3  . blood glucose meter kit and supplies KIT Dispense based on patient and insurance preference. Use up to four times daily as directed. DX E11.9 1 each 0  . buPROPion (WELLBUTRIN XL) 150 MG 24 hr tablet Take 1 tablet (150 mg total) by mouth daily. 90 tablet 3  . Cholecalciferol (VITAMIN D HIGH POTENCY) 25 MCG (1000 UT) capsule Vitamin D    . CRANBERRY PO Take by mouth.    . escitalopram (LEXAPRO) 20 MG tablet Take 1 tablet (20 mg total) by mouth daily. 90 tablet 3  . glucose blood (  ONETOUCH VERIO) test strip USE TO CHECK BLOOD SUGAR DAILY AND PRN 100 each 12  . HYDROcodone-acetaminophen (NORCO/VICODIN) 5-325 MG tablet Take 1 tablet by mouth every 6 (six) hours as needed.    Marland Kitchen levothyroxine (SYNTHROID) 75 MCG tablet Take 1 tablet (75 mcg total) by mouth daily. 90 tablet 3  . metFORMIN (GLUCOPHAGE XR) 500 MG 24 hr tablet Take 2 tablets (1,000 mg total) by mouth in the morning and at bedtime. 360 tablet 3  . methocarbamol (ROBAXIN) 500 MG tablet methocarbamol 500 mg tablet  Take 1 tablet 3 times a day by oral route as needed.    . simvastatin (ZOCOR) 20  MG tablet Take 1 tablet (20 mg total) by mouth at bedtime. TAKE 1 TABLET BY MOUTH EVERYDAY AT BEDTIME 90 tablet 3  . tiZANidine (ZANAFLEX) 4 MG capsule Take 1 capsule (4 mg total) by mouth 3 (three) times daily as needed for muscle spasms. 40 capsule 0  . traZODone (DESYREL) 50 MG tablet Take 1 tablet (50 mg total) by mouth at bedtime. 90 tablet 3   No current facility-administered medications for this visit.     Objective:  BP (!) 108/48   Pulse 74   Temp 98 F (36.7 C) (Temporal)   Ht '5\' 6"'  (1.676 m)   Wt 227 lb 6.4 oz (103.1 kg)   SpO2 93%   BMI 36.70 kg/m  Gen: NAD, resting comfortably CV: RRR no murmurs rubs or gallops Lungs: CTAB no crackles, wheeze, rhonchi Abdomen: soft/nontender/nondistended/normal bowel sounds. No rebound or guarding.  Ext: no edema in either leg, in left upper calf there is a slight bulge perhaps 3 x 1 cm (smaller than it was previously per her report) - no pain with palpation Skin: warm, dry, no redness, swelling with exam of calf  Diabetic Foot Exam - Simple   Simple Foot Form Diabetic Foot exam was performed with the following findings: Yes 07/03/2020  4:16 PM  Visual Inspection No deformities, no ulcerations, no other skin breakdown bilaterally: Yes Sensation Testing Intact to touch and monofilament testing bilaterally: Yes Pulse Check Posterior Tibialis and Dorsalis pulse intact bilaterally: Yes Comments Slight decrease at toes bilaterally        Assessment and Plan  #Left calf pain/growth S: Patient noted in her left upper calf several weeks ago an area approximately 4 to 5 x 1.5 to 2 cm growth which she was concerned could be a cyst.  She was having some intermittent shooting pains around this.  Fortunately the area seems to have gotten smaller over time.  She has had some pain around it but that also seems to be improving.  No fever or chills.  No chest pain or shortness of breath reported.  She had toe surgery on the left foot in January  or February but this occurred months later A/P: left calf pain/growht - Thankfully area has improved (size decreased and pain better) but I still would like to rule out a superficial thrombophlebitis and any potential deep extension.  She will monitor over the weekend if she has any worsening pain or chest pain or shortness of breath will seek care-I suspect that will not happen though.  We will try to get an ultrasound early next week- do not suspect we can get done on a Friday afternoon.  Recommended follow up:  Keep June visit- labs before visit Future Appointments  Date Time Provider [place]  07/06/2020  3:00 PM [facility] VASC 4 [facility]  07/07/2020 12:30  PM Danie Binder, PT [facility]  07/09/2020 12:30 PM Danie Binder, PT [facility]  07/13/2020  2:45 PM Danie Binder, PT [facility]  07/15/2020  2:45 PM Altamese Dilling, PTA [facility]  07/21/2020  2:00 PM [facility]  07/22/2020  2:00 PM Altamese Dilling, PTA [facility]  07/28/2020 11:00 AM Yong Channel Brayton Mars, MD [facility]    Lab/Order associations:   ICD-10-CM   1. Pain of left calf  M79.662 VAS [place] LOWER EXTREMITY VENOUS (DVT)   Time Spent: 20 minutes of total time (4:03 PM- 4:23 PM) was spent on the date of the encounter performing the following actions: chart review prior to seeing the patient, obtaining history, performing a medically necessary exam, counseling on the treatment plan, placing orders, and documenting in our EHR.   Return precautions advised.  [name], MD

## 2020-07-02 NOTE — Patient Instructions (Signed)
  Testing/Procedures:  Your physician has requested that you have a carotid duplex. This test is an ultrasound of the carotid arteries in your neck. It looks at blood flow through these arteries that supply the brain with blood. Allow one hour for this exam. There are no restrictions or special instructions.[facility]   Follow-Up: At [facility], you and your health needs are our priority.  As part of our continuing mission to provide you with exceptional heart care, we have created designated Provider Care Teams.  These Care Teams include your primary Cardiologist (physician) and Advanced Practice Providers (APPs -  Physician Assistants and Nurse Practitioners) who all work together to provide you with the care you need, when you need it.  We recommend signing up for the patient portal called "MyChart".  Sign up information is provided on this After Visit Summary.  MyChart is used to connect with patients for Virtual Visits (Telemedicine).  Patients are able to view lab/test results, encounter notes, upcoming appointments, etc.  Non-urgent messages can be sent to your provider as well.   To learn more about what you can do with MyChart, go to [email or web address].    Your next appointment:    AS NEEDED

## 2020-07-02 NOTE — Patient Instructions (Addendum)
Health Maintenance Due  Topic Date Due  . PNA vac Low Risk Adult - consider prevnar 20 at next visit 03/12/2020   left calf pain/growht - Thankfully area has improved (size decreased and pain better) but I still would like to rule out a superficial thrombophlebitis and any potential deep extension.  She will monitor over the weekend if she has any worsening pain or chest pain or shortness of breath will seek care-I suspect that will not happen though.  We will try to get an ultrasound early next week- do not suspect we can get done on a Friday afternoon.  Team please let Lattie Haw know about DVT scan Patient should get a call Monday about this

## 2020-07-03 ENCOUNTER — Other Ambulatory Visit: Payer: Self-pay

## 2020-07-03 ENCOUNTER — Encounter: Payer: Self-pay | Admitting: Family Medicine

## 2020-07-03 ENCOUNTER — Ambulatory Visit (INDEPENDENT_AMBULATORY_CARE_PROVIDER_SITE_OTHER): Payer: Medicare Other | Admitting: Family Medicine

## 2020-07-03 VITALS — BP 108/48 | HR 74 | Temp 98.0°F | Ht 66.0 in | Wt 227.4 lb

## 2020-07-03 DIAGNOSIS — M79662 Pain in left lower leg: Secondary | ICD-10-CM | POA: Diagnosis not present

## 2020-07-06 ENCOUNTER — Ambulatory Visit (HOSPITAL_COMMUNITY)
Admission: RE | Admit: 2020-07-06 | Discharge: 2020-07-06 | Disposition: A | Discharge status: Home / Self Care | Payer: Medicare Other | Source: Ambulatory Visit | Attending: Family Medicine | Admitting: Family Medicine

## 2020-07-06 ENCOUNTER — Ambulatory Visit (HOSPITAL_COMMUNITY)
Admission: RE | Admit: 2020-07-06 | Payer: Medicare Other | Source: Ambulatory Visit | Attending: Cardiology | Admitting: Cardiology

## 2020-07-06 ENCOUNTER — Other Ambulatory Visit: Payer: Self-pay

## 2020-07-06 DIAGNOSIS — M79662 Pain in left lower leg: Secondary | ICD-10-CM | POA: Insufficient documentation

## 2020-07-07 ENCOUNTER — Other Ambulatory Visit: Payer: Self-pay

## 2020-07-07 ENCOUNTER — Ambulatory Visit: Payer: Medicare Other

## 2020-07-07 ENCOUNTER — Ambulatory Visit (HOSPITAL_BASED_OUTPATIENT_CLINIC_OR_DEPARTMENT_OTHER)
Admission: RE | Admit: 2020-07-07 | Discharge: 2020-07-07 | Disposition: A | Discharge status: Home / Self Care | Payer: Medicare Other | Source: Ambulatory Visit | Attending: Cardiovascular Disease | Admitting: Cardiovascular Disease

## 2020-07-07 DIAGNOSIS — R252 Cramp and spasm: Secondary | ICD-10-CM

## 2020-07-07 DIAGNOSIS — M546 Pain in thoracic spine: Secondary | ICD-10-CM

## 2020-07-07 DIAGNOSIS — R293 Abnormal posture: Secondary | ICD-10-CM

## 2020-07-07 DIAGNOSIS — R0989 Other specified symptoms and signs involving the circulatory and respiratory systems: Secondary | ICD-10-CM | POA: Insufficient documentation

## 2020-07-07 NOTE — Therapy (Signed)
[facility] [address], [place], [place], [address] Phone: [phone]   Fax:  [phone]  Physical Therapy Treatment  Patient Details  Name: [name] [name] MRN: [id number] Date of Birth: [birth year] Referring Provider (PT): Ward, [place], [place]   Encounter Date: 07/07/2020   PT End of Session - 07/07/20 1314    Visit Number 5    Date for PT Re-Evaluation 08/17/20    Authorization Type Medicare/AARP- KX at 15    PT Start Time 1231    PT Stop Time 1315    PT Time Calculation (min) 44 min    Activity Tolerance Patient tolerated treatment well    Behavior During Therapy [facility] for tasks assessed/performed           Past Medical History:  Diagnosis Date  . Acute meniscal tear of knee    Left knee  . Alcohol problem drinking    rehab  . Anemia    menstrual related  . Anxiety   . Arthritis    right ankle-neuropathy  . Colon polyps   . Depression   . DM (diabetes mellitus), type 2 ([place]) 12/2009   type 2  . Dyspnea   . Evalluate for OSA (obstructive sleep apnea) 08/15/2013   No OSA on sleep study but may be underestimated.-never used cpap    . GERD (gastroesophageal reflux disease)    not currently taking.  Marland Kitchen Hyperlipidemia   . Hypertension   . Hypothyroidism   . Liver disease   . Neuromuscular disorder ([place])    neropathy bilateral feet.  . Thyroid disease     Past Surgical History:  Procedure Laterality Date  . ADENOIDECTOMY    . BREAST SURGERY Right    lumpectomy-benign  . [place]   1 time  . CHOLECYSTECTOMY    . HAMMER TOE SURGERY     Dr. Doran [facility]- feb 2022  . HAMMERTOE RECONSTRUCTION WITH WEIL OSTEOTOMY Bilateral 04/02/2020   Procedure: Bilateral 2-3 Weil and hammertoe corrections;  Surgeon: Wylene Simmer, MD;  Location: [place];  Service: Orthopedics;  Laterality: Bilateral;  . INNER EAR SURGERY     left ear had tubes put in and removed, scar tissue  built up/ loss of hearing in left ear for over a year  . MASS EXCISION Left 12/16/2013   Procedure: EXCISION LEFT  DISTAL THIGH MASS;  Surgeon: Mauri Pole, MD;  [facility];  Service: Orthopedics;  Laterality: Left;  . right ankle surgery      x 2- no retained hardware  . right rotator cuff surgery      left side  . TONSILLECTOMY    . TOTAL KNEE ARTHROPLASTY  2009   right  . TOTAL KNEE ARTHROPLASTY Left 05/05/2015   Procedure: LEFT TOTAL KNEE ARTHROPLASTY;  Surgeon: Paralee Cancel, MD;  [facility];  Service: Orthopedics;  Laterality: Left;  . TOTAL KNEE REVISION Right 12/16/2013   Procedure: RIGHT TOTAL KNEE REVISION POLY EXCHANGE;  Surgeon: Mauri Pole, MD;  [facility];  Service: Orthopedics;  Laterality: Right;  . TOTAL KNEE REVISION Left 08/15/2019   Procedure: LEFT KNEE REVISION;  Surgeon: Paralee Cancel, MD;  [facility];  Service: Orthopedics;  Laterality: Left;  2 hrs  . TUBAL LIGATION  1987    There were no vitals filed for this visit.   Subjective Assessment - 07/07/20 1229    Subjective I'm sore today in my neck and low back.  I've been cleaning out my garage and moving things.    Currently in Pain? Yes    Pain Score 0-No pain    Pain Location Thoracic    Pain Orientation Left;Right;Mid    Pain Descriptors / Indicators Tightness                             [facility] Adult PT Treatment/Exercise - 07/07/20 0001      Lumbar Exercises: Stretches   Lower Trunk Rotation 3 reps;20 seconds      Shoulder Exercises: Supine   Horizontal ABduction Strengthening;Both;Theraband    Theraband Level (Shoulder Horizontal ABduction) Level 2 (Red)    External Rotation Strengthening;20 reps;Theraband    Theraband Level (Shoulder External Rotation) Level 2 (Red)    Diagonals Strengthening;20 reps;Theraband    Theraband Level (Shoulder Diagonals) Level 2 (Red)      Manual Therapy   Manual Therapy Soft tissue mobilization    Manual therapy  comments skilled palpation and monitoring/assessment with dry needling    Soft tissue mobilization elongation and joint mobs-thoracic spine            Trigger Point Dry Needling - 07/07/20 0001    Consent Given? Yes    Education Handout Provided Yes    Dry Needling Comments thoracic multifidi                  PT Short Term Goals - 07/01/20 1453      PT SHORT TERM GOAL #1   Title be independent in initial HEP    Baseline becoming more consistent    Status On-going      PT SHORT TERM GOAL #2   Title report a 30% reduction in thoracic pain with self-care and ADLs    Status On-going      PT SHORT TERM GOAL #3   Title --             PT Long Term Goals - 06/22/20 1529      PT LONG TERM GOAL #1   Title be independent in advanced HEP    Time 8    Period Weeks    Status New    Target Date 08/17/20      PT LONG TERM GOAL #2   Title FOTO improved to > or = to 59    Baseline 49    Time 8    Period Weeks    Status New    Target Date 08/17/20      PT LONG TERM GOAL #3   Title report < or = to 4/10 thoracic pain after standing and walking for community and home tasks    Baseline 8-10/10    Time 8    Period Weeks    Status New    Target Date 08/17/20      PT LONG TERM GOAL #4   Title report a 70% reduction in the frequency of thoracic pain with ADLs and self-care    Baseline --    Time 8    Period Weeks    Status New    Target Date 08/17/20      PT LONG TERM GOAL #5   Title verbalize and demonstrate body mechanics and posture modifications to reduce strain on thoracic spine    Time 8    Period Weeks    Status New    Target Date 08/17/20  Plan - 07/07/20 1245    Clinical Impression Statement Pt with neck and lumbar pain today due to increased activity at home.  She reports fewer episodes of thoracic spasms with bending forward since last session.  Pt has been moderately consistent with thoracic strength exercises and PT reviewed  these today. Pt required intermittent tactile and verbal cues to reduce upper trap activation with these exercises, although improved from last session.  Pt with tension in thoracic paraspinals and demonstrated improved tissue mobility after manual therapy and dry needling today.  Pt will continue to benefit from skilled PT to address thoracic pain, postural and segmental/tissue mobility.    PT Frequency 2x / week    PT Duration 8 weeks    PT Treatment/Interventions ADLs/Self Care Home Management;Cryotherapy;Traction;Moist Heat;Electrical Stimulation;Functional mobility training;Neuromuscular re-education;Therapeutic exercise;Therapeutic activities;Patient/family education;Manual techniques;Passive range of motion;Dry needling;Taping;Spinal Manipulations;Joint Manipulations    PT Next Visit Plan DN to thoracic spine again with tissue mobilizations and elogation of paraspinals    PT Home Exercise Plan Access Code: [email or web address]    Consulted and Agree with Plan of Care Patient           Patient will benefit from skilled therapeutic intervention in order to improve the following deficits and impairments:  Decreased activity tolerance,Postural dysfunction,Pain,Improper body mechanics,Decreased range of motion,Decreased mobility,Hypomobility  Visit Diagnosis: Pain in thoracic spine  Abnormal posture  Cramp and spasm     Problem List Patient Active Problem List   Diagnosis Date Noted  . S/P revision of total knee, left 08/15/2019  . Preop cardiovascular exam 08/13/2019  . Osteoarthritis of ankle, right 01/29/2018  . Disorder of tendon of posterior tibial muscle 07/06/2017  . Sleep related headaches 09/07/2016  . Nightmares REM-sleep type 09/07/2016  . RLS (restless legs syndrome) 09/07/2016  . Snoring 09/07/2016  . Excessive daytime sleepiness 09/07/2016  . Migraine 07/28/2016  . Solitary pulmonary nodule 06/08/2016  . S/P knee replacement 05/05/2015  . Former smoker 07/10/2014   . Shortness of breath 06/07/2014  . Chest pain 06/07/2014  . Osteoarthritis 05/16/2014  . Alcoholic fatty liver 02/77/4128  . Chronic post-traumatic stress disorder (PTSD) 05/02/2014  . Sleep disorder 05/02/2014  . Family history of alcoholism 05/02/2014  . Macrocytosis 04/19/2014  . Fatigue 08/15/2013  . Thyroid nodule 09/17/2012  . Hypothyroid 07/21/2010  . Type II diabetes mellitus, well controlled ([place]) 06/23/2010  . Hypertension associated with diabetes ([place]) 06/23/2010  . Alcohol use disorder, severe, in sustained remission ([place]) 06/23/2010  . Hyperlipidemia associated with type 2 diabetes mellitus ([place]) 06/23/2010  . Morbid obesity ([place]) 06/23/2010  . Chronic depression 06/23/2010  . History of colonic polyps 04/12/2010    Sigurd Sos, PT 07/07/20 1:16 PM  [facility] [address], [place], [place], [address] Phone: [phone]   Fax:  [phone]  Name: Trenace Coughlin MRN: [id number] Date of Birth: 11-08-1955

## 2020-07-09 ENCOUNTER — Ambulatory Visit: Payer: Medicare Other

## 2020-07-09 ENCOUNTER — Other Ambulatory Visit: Payer: Self-pay

## 2020-07-09 DIAGNOSIS — R252 Cramp and spasm: Secondary | ICD-10-CM

## 2020-07-09 DIAGNOSIS — M546 Pain in thoracic spine: Secondary | ICD-10-CM

## 2020-07-09 DIAGNOSIS — R293 Abnormal posture: Secondary | ICD-10-CM

## 2020-07-09 NOTE — Therapy (Signed)
[facility] [address] [place], [place], [address] Phone: [phone]   Fax:  [phone]  Physical Therapy Treatment  Patient Details  Name: [name] [name] MRN: [id number] Date of Birth: 01-Jun-1955 Referring Provider (PT): Ward, [place], [place]   Encounter Date: 07/09/2020   PT End of Session - 07/09/20 1308    Visit Number 6    Date for PT Re-Evaluation 08/17/20    Authorization Type Medicare/AARP- KX at 15    PT Start Time 1231    PT Stop Time 1309    PT Time Calculation (min) 38 min    Activity Tolerance Patient tolerated treatment well    Behavior During Therapy [facility] for tasks assessed/performed           Past Medical History:  Diagnosis Date  . Acute meniscal tear of knee    Left knee  . Alcohol problem drinking    rehab  . Anemia    menstrual related  . Anxiety   . Arthritis    right ankle-neuropathy  . Colon polyps   . Depression   . DM (diabetes mellitus), type 2 ([place]) 12/2009   type 2  . Dyspnea   . Evalluate for OSA (obstructive sleep apnea) 08/15/2013   No OSA on sleep study but may be underestimated.-never used cpap    . GERD (gastroesophageal reflux disease)    not currently taking.  Marland Kitchen Hyperlipidemia   . Hypertension   . Hypothyroidism   . Liver disease   . Neuromuscular disorder ([place])    neropathy bilateral feet.  . Thyroid disease     Past Surgical History:  Procedure Laterality Date  . ADENOIDECTOMY    . BREAST SURGERY Right    lumpectomy-benign  . [place]   1 time  . CHOLECYSTECTOMY    . HAMMER TOE SURGERY     Dr. Doran [facility]and [facility]- feb 2022  . HAMMERTOE RECONSTRUCTION WITH WEIL OSTEOTOMY Bilateral 04/02/2020   Procedure: Bilateral 2-3 Weil and hammertoe corrections;  Surgeon: Wylene Simmer, MD;  Location: [place];  Service: Orthopedics;  Laterality: Bilateral;  . INNER EAR SURGERY     left ear had tubes put in and removed, scar tissue  built up/ loss of hearing in left ear for over a year  . MASS EXCISION Left 12/16/2013   Procedure: EXCISION LEFT  DISTAL THIGH MASS;  Surgeon: Mauri Pole, MD;  [facility];  Service: Orthopedics;  Laterality: Left;  . right ankle surgery      x 2- no retained hardware  . right rotator cuff surgery      left side  . TONSILLECTOMY    . TOTAL KNEE ARTHROPLASTY  2009   right  . TOTAL KNEE ARTHROPLASTY Left 05/05/2015   Procedure: LEFT TOTAL KNEE ARTHROPLASTY;  Surgeon: Paralee Cancel, MD;  [facility];  Service: Orthopedics;  Laterality: Left;  . TOTAL KNEE REVISION Right 12/16/2013   Procedure: RIGHT TOTAL KNEE REVISION POLY EXCHANGE;  Surgeon: Mauri Pole, MD;  [facility];  Service: Orthopedics;  Laterality: Right;  . TOTAL KNEE REVISION Left 08/15/2019   Procedure: LEFT KNEE REVISION;  Surgeon: Paralee Cancel, MD;  [facility];  Service: Orthopedics;  Laterality: Left;  2 hrs  . TUBAL LIGATION  1987    There were no vitals filed for this visit.   Subjective Assessment - 07/09/20 1233    Subjective I am getting that intense pain with reaching and  coming back to neutral from flexion over the past few days.    Currently in Pain? Yes    Pain Score 5     Pain Location Thoracic    Pain Orientation Right;Left;Mid    Pain Descriptors / Indicators Tightness                             [facility] Adult PT Treatment/Exercise - 07/09/20 0001      Lumbar Exercises: Stretches   Lower Trunk Rotation 3 reps;20 seconds      Lumbar Exercises: Supine   Other Supine Lumbar Exercises ball rolls forward and bil diagonals x10 each      Shoulder Exercises: Seated   Flexion Strengthening;Both;20 reps;Weights    Flexion Weight (lbs) 1    Diagonals Strengthening;Both;20 reps;Weights    Diagonals Weight (lbs) 1      Shoulder Exercises: Sidelying   Other Sidelying Exercises open book x10 bil each      Shoulder Exercises: Standing   Extension  Strengthening;Both;20 reps;Theraband    Theraband Level (Shoulder Extension) Level 2 (Red)    Row Strengthening;Both;20 reps    Theraband Level (Shoulder Row) Level 2 (Red)      Shoulder Exercises: ROM/Strengthening   UBE (Upper Arm Bike) Level 1x 6 minutes-PT present to discuss progress                    PT Short Term Goals - 07/01/20 1453      PT SHORT TERM GOAL #1   Title be independent in initial HEP    Baseline becoming more consistent    Status On-going      PT SHORT TERM GOAL #2   Title report a 30% reduction in thoracic pain with self-care and ADLs    Status On-going      PT SHORT TERM GOAL #3   Title --             PT Long Term Goals - 06/22/20 1529      PT LONG TERM GOAL #1   Title be independent in advanced HEP    Time 8    Period Weeks    Status New    Target Date 08/17/20      PT LONG TERM GOAL #2   Title FOTO improved to > or = to 59    Baseline 49    Time 8    Period Weeks    Status New    Target Date 08/17/20      PT LONG TERM GOAL #3   Title report < or = to 4/10 thoracic pain after standing and walking for community and home tasks    Baseline 8-10/10    Time 8    Period Weeks    Status New    Target Date 08/17/20      PT LONG TERM GOAL #4   Title report a 70% reduction in the frequency of thoracic pain with ADLs and self-care    Baseline --    Time 8    Period Weeks    Status New    Target Date 08/17/20      PT LONG TERM GOAL #5   Title verbalize and demonstrate body mechanics and posture modifications to reduce strain on thoracic spine    Time 8    Period Weeks    Status New    Target Date 08/17/20  Plan - 07/09/20 1300    Clinical Impression Statement Pt with increased thoracic spasms over the past few days with unknown cause. Session focused on postural strength and thoracolumbar flexibility to address pain.  Pt tolerated all exercise well today and required tactile and frequent verbal cues  for scapular depression.  Pt will continue to benefit from skilled PT to address thoracic pain, flexibility and postural strength.    PT Frequency 2x / week    PT Duration 8 weeks    PT Treatment/Interventions ADLs/Self Care Home Management;Cryotherapy;Traction;Moist Heat;Electrical Stimulation;Functional mobility training;Neuromuscular re-education;Therapeutic exercise;Therapeutic activities;Patient/family education;Manual techniques;Passive range of motion;Dry needling;Taping;Spinal Manipulations;Joint Manipulations    PT Next Visit Plan DN to thoracic spine again with tissue mobilizations and elogation of paraspinals    PT Home Exercise Plan Access Code: [email or web address]    Recommended Other Services initial cert is signed    Consulted and Agree with Plan of Care Patient           Patient will benefit from skilled therapeutic intervention in order to improve the following deficits and impairments:  Decreased activity tolerance,Postural dysfunction,Pain,Improper body mechanics,Decreased range of motion,Decreased mobility,Hypomobility  Visit Diagnosis: Pain in thoracic spine  Abnormal posture  Cramp and spasm     Problem List Patient Active Problem List   Diagnosis Date Noted  . S/P revision of total knee, left 08/15/2019  . Preop cardiovascular exam 08/13/2019  . Osteoarthritis of ankle, right 01/29/2018  . Disorder of tendon of posterior tibial muscle 07/06/2017  . Sleep related headaches 09/07/2016  . Nightmares REM-sleep type 09/07/2016  . RLS (restless legs syndrome) 09/07/2016  . Snoring 09/07/2016  . Excessive daytime sleepiness 09/07/2016  . Migraine 07/28/2016  . Solitary pulmonary nodule 06/08/2016  . S/P knee replacement 05/05/2015  . Former smoker 07/10/2014  . Shortness of breath 06/07/2014  . Chest pain 06/07/2014  . Osteoarthritis 05/16/2014  . Alcoholic fatty liver 02/40/9735  . Chronic post-traumatic stress disorder (PTSD) 05/02/2014  . Sleep disorder  05/02/2014  . Family history of alcoholism 05/02/2014  . Macrocytosis 04/19/2014  . Fatigue 08/15/2013  . Thyroid nodule 09/17/2012  . Hypothyroid 07/21/2010  . Type II diabetes mellitus, well controlled ([place]) 06/23/2010  . Hypertension associated with diabetes ([place]) 06/23/2010  . Alcohol use disorder, severe, in sustained remission ([place]) 06/23/2010  . Hyperlipidemia associated with type 2 diabetes mellitus ([place]) 06/23/2010  . Morbid obesity ([place]) 06/23/2010  . Chronic depression 06/23/2010  . History of colonic polyps 04/12/2010    Sigurd Sos, PT 07/09/20 1:10 PM  [facility] [address] [place], [place], [address] Phone: [phone]   Fax:  [phone]  Name: [name] [name] MRN: [id number] Date of Birth: [birth year]

## 2020-07-13 ENCOUNTER — Other Ambulatory Visit: Payer: Self-pay

## 2020-07-13 ENCOUNTER — Ambulatory Visit: Payer: Medicare Other

## 2020-07-13 DIAGNOSIS — R252 Cramp and spasm: Secondary | ICD-10-CM

## 2020-07-13 DIAGNOSIS — M546 Pain in thoracic spine: Secondary | ICD-10-CM | POA: Diagnosis not present

## 2020-07-13 DIAGNOSIS — R293 Abnormal posture: Secondary | ICD-10-CM

## 2020-07-13 NOTE — Therapy (Signed)
[facility] [address], [place], [place], [address] Phone: [phone]   Fax:  [phone]  Physical Therapy Treatment  Patient Details  Name: [name] [name] MRN: [id number] Date of Birth: Oct 24, 1955 Referring Provider (PT): Ward, [place], [place]   Encounter Date: 07/13/2020   PT End of Session - 07/13/20 1523    Visit Number 7    Date for PT Re-Evaluation 08/17/20    Authorization Type Medicare/AARP- KX at 15    Progress Note Due on Visit 10    PT Start Time 1447   dry needling   PT Stop Time 1527    PT Time Calculation (min) 40 min    Activity Tolerance Patient tolerated treatment well    Behavior During Therapy [facility] for tasks assessed/performed           Past Medical History:  Diagnosis Date  . Acute meniscal tear of knee    Left knee  . Alcohol problem drinking    rehab  . Anemia    menstrual related  . Anxiety   . Arthritis    right ankle-neuropathy  . Colon polyps   . Depression   . DM (diabetes mellitus), type 2 ([place]) 12/2009   type 2  . Dyspnea   . Evalluate for OSA (obstructive sleep apnea) 08/15/2013   No OSA on sleep study but may be underestimated.-never used cpap    . GERD (gastroesophageal reflux disease)    not currently taking.  Marland Kitchen Hyperlipidemia   . Hypertension   . Hypothyroidism   . Liver disease   . Neuromuscular disorder ([place])    neropathy bilateral feet.  . Thyroid disease     Past Surgical History:  Procedure Laterality Date  . ADENOIDECTOMY    . BREAST SURGERY Right    lumpectomy-benign  . [place]   1 time  . CHOLECYSTECTOMY    . HAMMER TOE SURGERY     Dr. Doran [facility]- feb 2022  . HAMMERTOE RECONSTRUCTION WITH WEIL OSTEOTOMY Bilateral 04/02/2020   Procedure: Bilateral 2-3 Weil and hammertoe corrections;  Surgeon: Wylene Simmer, MD;  Location: [place];  Service: Orthopedics;  Laterality: Bilateral;  . INNER EAR SURGERY     left  ear had tubes put in and removed, scar tissue built up/ loss of hearing in left ear for over a year  . MASS EXCISION Left 12/16/2013   Procedure: EXCISION LEFT  DISTAL THIGH MASS;  Surgeon: Mauri Pole, MD;  [facility];  Service: Orthopedics;  Laterality: Left;  . right ankle surgery      x 2- no retained hardware  . right rotator cuff surgery      left side  . TONSILLECTOMY    . TOTAL KNEE ARTHROPLASTY  2009   right  . TOTAL KNEE ARTHROPLASTY Left 05/05/2015   Procedure: LEFT TOTAL KNEE ARTHROPLASTY;  Surgeon: Paralee Cancel, MD;  [facility];  Service: Orthopedics;  Laterality: Left;  . TOTAL KNEE REVISION Right 12/16/2013   Procedure: RIGHT TOTAL KNEE REVISION POLY EXCHANGE;  Surgeon: Mauri Pole, MD;  [facility];  Service: Orthopedics;  Laterality: Right;  . TOTAL KNEE REVISION Left 08/15/2019   Procedure: LEFT KNEE REVISION;  Surgeon: Paralee Cancel, MD;  Location[facility];  Service: Orthopedics;  Laterality: Left;  2 hrs  . TUBAL LIGATION  1987    There were no vitals filed for this visit.   Subjective Assessment - 07/13/20 1449  Subjective I am feeling better today.  I still have spasms at time.   I use Salanpas and that helps.    Currently in Pain? Yes    Pain Score 0-No pain    Pain Location Thoracic    Pain Orientation Left;Right    Aggravating Factors  activity, planting flowers    Pain Relieving Factors sitting down                             [facility] Adult PT Treatment/Exercise - 07/13/20 0001      Lumbar Exercises: Supine   Other Supine Lumbar Exercises ball rolls forward and bil diagonals x10 each      Shoulder Exercises: Seated   Flexion Strengthening;Both;20 reps;Weights    Flexion Weight (lbs) 1    Diagonals Strengthening;Both;20 reps;Weights    Diagonals Weight (lbs) 1      Shoulder Exercises: ROM/Strengthening   UBE (Upper Arm Bike) Level 1x 6 minutes-PT present to discuss progress      Manual Therapy   Manual  Therapy Soft tissue mobilization    Manual therapy comments skilled palpation and monitoring/assessment with dry needling    Soft tissue mobilization elongation and joint mobs-thoracic spine            Trigger Point Dry Needling - 07/13/20 0001    Consent Given? Yes    Education Handout Provided Yes    Dry Needling Comments thoracic multifidi                  PT Short Term Goals - 07/01/20 1453      PT SHORT TERM GOAL #1   Title be independent in initial HEP    Baseline becoming more consistent    Status On-going      PT SHORT TERM GOAL #2   Title report a 30% reduction in thoracic pain with self-care and ADLs    Status On-going      PT SHORT TERM GOAL #3   Title --             PT Long Term Goals - 06/22/20 1529      PT LONG TERM GOAL #1   Title be independent in advanced HEP    Time 8    Period Weeks    Status New    Target Date 08/17/20      PT LONG TERM GOAL #2   Title FOTO improved to > or = to 59    Baseline 49    Time 8    Period Weeks    Status New    Target Date 08/17/20      PT LONG TERM GOAL #3   Title report < or = to 4/10 thoracic pain after standing and walking for community and home tasks    Baseline 8-10/10    Time 8    Period Weeks    Status New    Target Date 08/17/20      PT LONG TERM GOAL #4   Title report a 70% reduction in the frequency of thoracic pain with ADLs and self-care    Baseline --    Time 8    Period Weeks    Status New    Target Date 08/17/20      PT LONG TERM GOAL #5   Title verbalize and demonstrate body mechanics and posture modifications to reduce strain on thoracic spine    Time 8  Period Weeks    Status New    Target Date 08/17/20                 Plan - 07/13/20 1456    Clinical Impression Statement Spasms in the thoracic spine have reduced by 40% since the start of care.   She reports fewer episodes of thoracic spasms with bending forward since last session.  Pt required intermittent  tactile and verbal cues to reduce upper trap activation with seated exercises today, although improved from last session.  Pt with tension in thoracic paraspinals and demonstrated improved tissue mobility after manual therapy and dry needling today.  Pt will continue to benefit from skilled PT to address thoracic pain, postural and segmental/tissue mobility.    PT Frequency 2x / week    PT Duration 8 weeks    PT Treatment/Interventions ADLs/Self Care Home Management;Cryotherapy;Traction;Moist Heat;Electrical Stimulation;Functional mobility training;Neuromuscular re-education;Therapeutic exercise;Therapeutic activities;Patient/family education;Manual techniques;Passive range of motion;Dry needling;Taping;Spinal Manipulations;Joint Manipulations    PT Next Visit Plan assess response to dry needling, postural strength, manual, flexiblity    PT Home Exercise Plan Access Code: [email or web address]    Consulted and Agree with Plan of Care Patient           Patient will benefit from skilled therapeutic intervention in order to improve the following deficits and impairments:  Decreased activity tolerance,Postural dysfunction,Pain,Improper body mechanics,Decreased range of motion,Decreased mobility,Hypomobility  Visit Diagnosis: Pain in thoracic spine  Abnormal posture  Cramp and spasm     Problem List Patient Active Problem List   Diagnosis Date Noted  . S/P revision of total knee, left 08/15/2019  . Preop cardiovascular exam 08/13/2019  . Osteoarthritis of ankle, right 01/29/2018  . Disorder of tendon of posterior tibial muscle 07/06/2017  . Sleep related headaches 09/07/2016  . Nightmares REM-sleep type 09/07/2016  . RLS (restless legs syndrome) 09/07/2016  . Snoring 09/07/2016  . Excessive daytime sleepiness 09/07/2016  . Migraine 07/28/2016  . Solitary pulmonary nodule 06/08/2016  . S/P knee replacement 05/05/2015  . Former smoker 07/10/2014  . Shortness of breath 06/07/2014  .  Chest pain 06/07/2014  . Osteoarthritis 05/16/2014  . Alcoholic fatty liver 78/24/2353  . Chronic post-traumatic stress disorder (PTSD) 05/02/2014  . Sleep disorder 05/02/2014  . Family history of alcoholism 05/02/2014  . Macrocytosis 04/19/2014  . Fatigue 08/15/2013  . Thyroid nodule 09/17/2012  . Hypothyroid 07/21/2010  . Type II diabetes mellitus, well controlled ([place]) 06/23/2010  . Hypertension associated with diabetes ([place]) 06/23/2010  . Alcohol use disorder, severe, in sustained remission ([place]) 06/23/2010  . Hyperlipidemia associated with type 2 diabetes mellitus ([place]) 06/23/2010  . Morbid obesity ([place]) 06/23/2010  . Chronic depression 06/23/2010  . History of colonic polyps 04/12/2010     Sigurd Sos, PT 07/13/20 3:25 PM  [facility] [address], [place], [place], [address] Phone: [phone]   Fax:  [phone]  Name: Emmelina Mcloughlin MRN: [id number] Date of Birth: Mar 14, 1955

## 2020-07-15 ENCOUNTER — Ambulatory Visit: Payer: Medicare Other | Admitting: Physical Therapy

## 2020-07-15 ENCOUNTER — Other Ambulatory Visit: Payer: Self-pay

## 2020-07-15 ENCOUNTER — Encounter: Payer: Self-pay | Admitting: Physical Therapy

## 2020-07-15 DIAGNOSIS — M546 Pain in thoracic spine: Secondary | ICD-10-CM | POA: Diagnosis not present

## 2020-07-15 DIAGNOSIS — R293 Abnormal posture: Secondary | ICD-10-CM

## 2020-07-15 DIAGNOSIS — R252 Cramp and spasm: Secondary | ICD-10-CM

## 2020-07-15 NOTE — Therapy (Signed)
[facility] [address], [place], [place], [address] Phone: [phone]   Fax:  [phone]  Physical Therapy Treatment  Patient Details  Name: [name] [name] MRN: [id number] Date of Birth: 1956-02-03 Referring Provider (PT): Ward, [place], [place]   Encounter Date: 07/15/2020   PT End of Session - 07/15/20 1446    Visit Number 8    Date for PT Re-Evaluation 08/17/20    Authorization Type Medicare/AARP- KX at 15    PT Start Time 6644    PT Stop Time 1523    PT Time Calculation (min) 38 min    Activity Tolerance Patient tolerated treatment well    Behavior During Therapy [facility] for tasks assessed/performed           Past Medical History:  Diagnosis Date  . Acute meniscal tear of knee    Left knee  . Alcohol problem drinking    rehab  . Anemia    menstrual related  . Anxiety   . Arthritis    right ankle-neuropathy  . Colon polyps   . Depression   . DM (diabetes mellitus), type 2 ([place]) 12/2009   type 2  . Dyspnea   . Evalluate for OSA (obstructive sleep apnea) 08/15/2013   No OSA on sleep study but may be underestimated.-never used cpap    . GERD (gastroesophageal reflux disease)    not currently taking.  Marland Kitchen Hyperlipidemia   . Hypertension   . Hypothyroidism   . Liver disease   . Neuromuscular disorder ([place])    neropathy bilateral feet.  . Thyroid disease     Past Surgical History:  Procedure Laterality Date  . ADENOIDECTOMY    . BREAST SURGERY Right    lumpectomy-benign  . [place]   1 time  . CHOLECYSTECTOMY    . HAMMER TOE SURGERY     Dr. Doran [facility]- feb 2022  . HAMMERTOE RECONSTRUCTION WITH WEIL OSTEOTOMY Bilateral 04/02/2020   Procedure: Bilateral 2-3 Weil and hammertoe corrections;  Surgeon: Wylene Simmer, MD;  Location: [facility];  Service: Orthopedics;  Laterality: Bilateral;  . INNER EAR SURGERY     left ear had tubes put in and removed, scar tissue  built up/ loss of hearing in left ear for over a year  . MASS EXCISION Left 12/16/2013   Procedure: EXCISION LEFT  DISTAL THIGH MASS;  Surgeon: Mauri Pole, MD;  [facility];  Service: Orthopedics;  Laterality: Left;  . right ankle surgery      x 2- no retained hardware  . right rotator cuff surgery      left side  . TONSILLECTOMY    . TOTAL KNEE ARTHROPLASTY  2009   right  . TOTAL KNEE ARTHROPLASTY Left 05/05/2015   Procedure: LEFT TOTAL KNEE ARTHROPLASTY;  Surgeon: Paralee Cancel, MD;  [facility];  Service: Orthopedics;  Laterality: Left;  . TOTAL KNEE REVISION Right 12/16/2013   Procedure: RIGHT TOTAL KNEE REVISION POLY EXCHANGE;  Surgeon: Mauri Pole, MD;  [facility];  Service: Orthopedics;  Laterality: Right;  . TOTAL KNEE REVISION Left 08/15/2019   Procedure: LEFT KNEE REVISION;  Surgeon: Paralee Cancel, MD;  [facility];  Service: Orthopedics;  Laterality: Left;  2 hrs  . TUBAL LIGATION  1987    There were no vitals filed for this visit.   Subjective Assessment - 07/15/20 1449    Subjective Sore after needling but not bad. Pt fell/ slipped  in her dogs vomit yesterday.    Pertinent History chronic LBP, DM, HTN, TKA bil with revision.    Currently in Pain? Yes    Pain Score 1     Pain Location Back    Pain Orientation Mid    Multiple Pain Sites No                             [facility] Adult PT Treatment/Exercise - 07/15/20 0001      Lumbar Exercises: Supine   Other Supine Lumbar Exercises ball rolls forward and bil diagonals x10 each      Shoulder Exercises: Supine   Horizontal ABduction Strengthening;Both;10 reps;Theraband    Theraband Level (Shoulder Horizontal ABduction) Level 2 (Red)    Diagonals Strengthening;Both;10 reps;Theraband    Theraband Level (Shoulder Diagonals) Level 2 (Red)    Other Supine Exercises red band bow & arrow ( rows ) 10x bil      Shoulder Exercises: Seated   Flexion Strengthening;Right;10  reps;Weights;Left    Flexion Weight (lbs) 2    Diagonals Strengthening;Both;20 reps;Weights    Diagonals Weight (lbs) 1   pt felt 1# was appropriate for how she felt     Shoulder Exercises: ROM/Strengthening   UBE (Upper Arm Bike) L 1.5 3x3                    PT Short Term Goals - 07/15/20 1457      PT SHORT TERM GOAL #2   Title report a 30% reduction in thoracic pain with self-care and ADLs    Time 4    Period Weeks    Status Achieved   40%   Target Date 07/20/20             PT Long Term Goals - 06/22/20 1529      PT LONG TERM GOAL #1   Title be independent in advanced HEP    Time 8    Period Weeks    Status New    Target Date 08/17/20      PT LONG TERM GOAL #2   Title FOTO improved to > or = to 59    Baseline 49    Time 8    Period Weeks    Status New    Target Date 08/17/20      PT LONG TERM GOAL #3   Title report < or = to 4/10 thoracic pain after standing and walking for community and home tasks    Baseline 8-10/10    Time 8    Period Weeks    Status New    Target Date 08/17/20      PT LONG TERM GOAL #4   Title report a 70% reduction in the frequency of thoracic pain with ADLs and self-care    Baseline --    Time 8    Period Weeks    Status New    Target Date 08/17/20      PT LONG TERM GOAL #5   Title verbalize and demonstrate body mechanics and posture modifications to reduce strain on thoracic spine    Time 8    Period Weeks    Status New    Target Date 08/17/20                 Plan - 07/15/20 1446    Clinical Impression Statement Pt did well after dry needling last sesison, some soreness but "not bad."  Pt arrives with very little pain for todays session. Pt's exercises concentrated mainly on theraband scapular unattached exercises in supine. Pt reports a 40% reducation in pain at this time.    Personal Factors and Comorbidities Comorbidity 3+    Comorbidities HTN, bil total knee replacements and revision, chronic LBP     Examination-Activity Limitations Lift;Sit;Squat;Carry    Examination-Participation Restrictions Community Activity;Driving;Shop    Stability/Clinical Decision Making Stable/Uncomplicated    Rehab Potential Good    PT Frequency 2x / week    PT Duration 8 weeks    PT Treatment/Interventions ADLs/Self Care Home Management;Cryotherapy;Traction;Moist Heat;Electrical Stimulation;Functional mobility training;Neuromuscular re-education;Therapeutic exercise;Therapeutic activities;Patient/family education;Manual techniques;Passive range of motion;Dry needling;Taping;Spinal Manipulations;Joint Manipulations    PT Next Visit Plan assess response to dry needling, postural strength, manual, flexiblity    PT Home Exercise Plan Access Code: [email or web address]    Consulted and Agree with Plan of Care Patient           Patient will benefit from skilled therapeutic intervention in order to improve the following deficits and impairments:  Decreased activity tolerance,Postural dysfunction,Pain,Improper body mechanics,Decreased range of motion,Decreased mobility,Hypomobility  Visit Diagnosis: Pain in thoracic spine  Abnormal posture  Cramp and spasm     Problem List Patient Active Problem List   Diagnosis Date Noted  . S/P revision of total knee, left 08/15/2019  . Preop cardiovascular exam 08/13/2019  . Osteoarthritis of ankle, right 01/29/2018  . Disorder of tendon of posterior tibial muscle 07/06/2017  . Sleep related headaches 09/07/2016  . Nightmares REM-sleep type 09/07/2016  . RLS (restless legs syndrome) 09/07/2016  . Snoring 09/07/2016  . Excessive daytime sleepiness 09/07/2016  . Migraine 07/28/2016  . Solitary pulmonary nodule 06/08/2016  . S/P knee replacement 05/05/2015  . Former smoker 07/10/2014  . Shortness of breath 06/07/2014  . Chest pain 06/07/2014  . Osteoarthritis 05/16/2014  . Alcoholic fatty liver 01/74/9449  . Chronic post-traumatic stress disorder (PTSD) 05/02/2014   . Sleep disorder 05/02/2014  . Family history of alcoholism 05/02/2014  . Macrocytosis 04/19/2014  . Fatigue 08/15/2013  . Thyroid nodule 09/17/2012  . Hypothyroid 07/21/2010  . Type II diabetes mellitus, well controlled ([place]) 06/23/2010  . Hypertension associated with diabetes ([place]) 06/23/2010  . Alcohol use disorder, severe, in sustained remission ([place]) 06/23/2010  . Hyperlipidemia associated with type 2 diabetes mellitus ([place]) 06/23/2010  . Morbid obesity ([place]) 06/23/2010  . Chronic depression 06/23/2010  . History of colonic polyps 04/12/2010    Jessicaann Overbaugh, PTA 07/15/2020, 3:15 PM  [facility] [address], [place], [place], [address] Phone: [phone]   Fax:  [phone]  Name: [name] [name] MRN: [id number] Date of Birth: [birth year]

## 2020-07-21 ENCOUNTER — Other Ambulatory Visit (INDEPENDENT_AMBULATORY_CARE_PROVIDER_SITE_OTHER): Payer: Medicare Other

## 2020-07-21 ENCOUNTER — Other Ambulatory Visit: Payer: Self-pay

## 2020-07-21 DIAGNOSIS — E039 Hypothyroidism, unspecified: Secondary | ICD-10-CM

## 2020-07-21 DIAGNOSIS — Z0000 Encounter for general adult medical examination without abnormal findings: Secondary | ICD-10-CM | POA: Diagnosis not present

## 2020-07-21 DIAGNOSIS — E119 Type 2 diabetes mellitus without complications: Secondary | ICD-10-CM | POA: Diagnosis not present

## 2020-07-21 LAB — CBC WITH DIFFERENTIAL/PLATELET
Basophils Absolute: 0 10*3/uL (ref 0.0–0.1)
Basophils Relative: 0.2 % (ref 0.0–3.0)
Eosinophils Absolute: 0.1 10*3/uL (ref 0.0–0.7)
Eosinophils Relative: 0.8 % (ref 0.0–5.0)
HCT: 43.3 % (ref 36.0–46.0)
Hemoglobin: 15.1 g/dL — ABNORMAL HIGH (ref 12.0–15.0)
Lymphocytes Relative: 45.5 % (ref 12.0–46.0)
Lymphs Abs: 4.3 10*3/uL — ABNORMAL HIGH (ref 0.7–4.0)
MCHC: 34.9 g/dL (ref 30.0–36.0)
MCV: 95.2 fl (ref 78.0–100.0)
Monocytes Absolute: 0.7 10*3/uL (ref 0.1–1.0)
Monocytes Relative: 7.2 % (ref 3.0–12.0)
Neutro Abs: 4.4 10*3/uL (ref 1.4–7.7)
Neutrophils Relative %: 46.3 % (ref 43.0–77.0)
Platelets: 248 10*3/uL (ref 150.0–400.0)
RBC: 4.55 Mil/uL (ref 3.87–5.11)
RDW: 13.8 % (ref 11.5–15.5)
WBC: 9.4 10*3/uL (ref 4.0–10.5)

## 2020-07-21 LAB — HEMOGLOBIN A1C: Hgb A1c MFr Bld: 6 % (ref 4.6–6.5)

## 2020-07-21 LAB — TSH: TSH: 2.05 u[IU]/mL (ref 0.35–4.50)

## 2020-07-21 NOTE — Addendum Note (Signed)
Addended by: Brandy Hale on: 07/21/2020 01:46 PM   Modules accepted: Orders

## 2020-07-22 ENCOUNTER — Encounter: Payer: Self-pay | Admitting: Physical Therapy

## 2020-07-22 ENCOUNTER — Ambulatory Visit: Payer: Medicare Other | Attending: Orthopedic Surgery | Admitting: Physical Therapy

## 2020-07-22 DIAGNOSIS — R293 Abnormal posture: Secondary | ICD-10-CM | POA: Diagnosis present

## 2020-07-22 DIAGNOSIS — M546 Pain in thoracic spine: Secondary | ICD-10-CM | POA: Diagnosis present

## 2020-07-22 DIAGNOSIS — R252 Cramp and spasm: Secondary | ICD-10-CM | POA: Diagnosis present

## 2020-07-22 NOTE — Therapy (Addendum)
[facility] [address] [place], [place], [address] Phone: [phone]   Fax:  [phone]  Physical Therapy Treatment  Patient Details  Name: [name] [name] MRN: [id number] Date of Birth: [birth year] Referring Provider (PT): Ward, [place], [place]   Encounter Date: 07/22/2020   PT End of Session - 07/22/20 1400    Visit Number 9    Date for PT Re-Evaluation 08/17/20    Authorization Type Medicare/AARP- KX at 15    Progress Note Due on Visit 10    PT Start Time 1400    PT Stop Time 1445    PT Time Calculation (min) 45 min    Activity Tolerance Patient tolerated treatment well    Behavior During Therapy [facility] for tasks assessed/performed           Past Medical History:  Diagnosis Date  . Acute meniscal tear of knee    Left knee  . Alcohol problem drinking    rehab  . Anemia    menstrual related  . Anxiety   . Arthritis    right ankle-neuropathy  . Colon polyps   . Depression   . DM (diabetes mellitus), type 2 ([place]) 12/2009   type 2  . Dyspnea   . Evalluate for OSA (obstructive sleep apnea) 08/15/2013   No OSA on sleep study but may be underestimated.-never used cpap    . GERD (gastroesophageal reflux disease)    not currently taking.  Marland Kitchen Hyperlipidemia   . Hypertension   . Hypothyroidism   . Liver disease   . Neuromuscular disorder ([place])    neropathy bilateral feet.  . Thyroid disease     Past Surgical History:  Procedure Laterality Date  . ADENOIDECTOMY    . BREAST SURGERY Right    lumpectomy-benign  . [place]   1 time  . CHOLECYSTECTOMY    . HAMMER TOE SURGERY     Dr. Doran Durand [facility]- feb 2022  . HAMMERTOE RECONSTRUCTION WITH WEIL OSTEOTOMY Bilateral 04/02/2020   Procedure: Bilateral 2-3 Weil and hammertoe corrections;  Surgeon: Wylene Simmer, MD;  Location: [place];  Service: Orthopedics;  Laterality: Bilateral;  . INNER EAR SURGERY     left ear had tubes  put in and removed, scar tissue built up/ loss of hearing in left ear for over a year  . MASS EXCISION Left 12/16/2013   Procedure: EXCISION LEFT  DISTAL THIGH MASS;  Surgeon: Mauri Pole, MD;  Location: [facility];  Service: Orthopedics;  Laterality: Left;  . right ankle surgery      x 2- no retained hardware  . right rotator cuff surgery      left side  . TONSILLECTOMY    . TOTAL KNEE ARTHROPLASTY  2009   right  . TOTAL KNEE ARTHROPLASTY Left 05/05/2015   Procedure: LEFT TOTAL KNEE ARTHROPLASTY;  Surgeon: Paralee Cancel, MD;  Location: [facility];  Service: Orthopedics;  Laterality: Left;  . TOTAL KNEE REVISION Right 12/16/2013   Procedure: RIGHT TOTAL KNEE REVISION POLY EXCHANGE;  Surgeon: Mauri Pole, MD;  Location: [facility];  Service: Orthopedics;  Laterality: Right;  . TOTAL KNEE REVISION Left 08/15/2019   Procedure: LEFT KNEE REVISION;  Surgeon: Paralee Cancel, MD;  Location: [facility];  Service: Orthopedics;  Laterality: Left;  2 hrs  . TUBAL LIGATION  1987    There were no vitals filed for this visit.   Subjective Assessment - 07/22/20 1406    Subjective  My back is feeling good today.    Pertinent History chronic LBP, DM, HTN, TKA bil with revision.    Currently in Pain? No/denies    Multiple Pain Sites No              [facility] PT Assessment - 07/22/20 0001      Assessment   Medical Diagnosis thoracic back pain    Referring Provider (PT) Ward, Estill Bamberg, [facility]    Onset Date/Surgical Date 04/16/20      Observation/Other Assessments   Focus on Therapeutic Outcomes (FOTO)  68      AROM   Overall AROM Comments cervical A/ROM is limited by 25%. Reduced thoracic segmental mobility into sidebending.                         [facility] Adult PT Treatment/Exercise - 07/22/20 0001      Shoulder Exercises: Supine   Horizontal ABduction Strengthening;Both;20 reps;Theraband    Theraband Level (Shoulder Horizontal ABduction) Level 3 (Green)    Diagonals  Strengthening;Both;Theraband;12 reps    Theraband Level (Shoulder Diagonals) Level 2 (Red)    Other Supine Exercises green band bow & arrow ( rows ) 10x2 bil      Shoulder Exercises: Seated   Flexion Strengthening;Both;12 reps;Weights    Flexion Weight (lbs) 2    Diagonals Strengthening;Both;20 reps;Weights    Diagonals Weight (lbs) 2      Shoulder Exercises: ROM/Strengthening   UBE (Upper Arm Bike) L 1.7 3x3 PTA present to discuss status                    PT Short Term Goals - 07/22/20 1429      PT SHORT TERM GOAL #1   Title be independent in initial HEP    Period Weeks    Status Achieved    Target Date 07/20/20      PT SHORT TERM GOAL #2   Title report a 30% reduction in thoracic pain with self-care and ADLs    Time 4    Period Weeks    Target Date 07/20/20             PT Long Term Goals - 07/22/20 1429      PT LONG TERM GOAL #1   Title be independent in advanced HEP    Time 8    Period Weeks    Status Achieved      PT LONG TERM GOAL #2   Title FOTO improved to > or = to 59    Time 8    Period Weeks    Status Achieved   68     PT LONG TERM GOAL #3   Title report < or = to 4/10 thoracic pain after standing and walking for community and home tasks    Time 8    Period Weeks    Status Achieved   0-1/10     PT LONG TERM GOAL #4   Title report a 70% reduction in the frequency of thoracic pain with ADLs and self-care    Time 8    Period Weeks    Status Achieved   90% less     PT LONG TERM GOAL #5   Title verbalize and demonstrate body mechanics and posture modifications to reduce strain on thoracic spine    Time 8    Period Weeks    Status Achieved  Plan - 07/22/20 1424    Clinical Impression Statement Pt has had essentially no pain over the last few days and is very pleased with her progress. She presents pain free today and continues to work in supported supine to strengthen her upper back and shoulder muscles.     Personal Factors and Comorbidities Comorbidity 3+    Comorbidities HTN, bil total knee replacements and revision, chronic LBP    Examination-Activity Limitations Lift;Sit;Squat;Carry    Stability/Clinical Decision Making Stable/Uncomplicated    PT Frequency 2x / week    PT Duration 8 weeks    PT Treatment/Interventions ADLs/Self Care Home Management;Cryotherapy;Traction;Moist Heat;Electrical Stimulation;Functional mobility training;Neuromuscular re-education;Therapeutic exercise;Therapeutic activities;Patient/family education;Manual techniques;Passive range of motion;Dry needling;Taping;Spinal Manipulations;Joint Manipulations    PT Home Exercise Plan Access Code: [email or web address]    Consulted and Agree with Plan of Care Patient           Patient will benefit from skilled therapeutic intervention in order to improve the following deficits and impairments:  Decreased activity tolerance,Postural dysfunction,Pain,Improper body mechanics,Decreased range of motion,Decreased mobility,Hypomobility  Visit Diagnosis: Pain in thoracic spine  Abnormal posture  Cramp and spasm     Problem List Patient Active Problem List   Diagnosis Date Noted  . S/P revision of total knee, left 08/15/2019  . Preop cardiovascular exam 08/13/2019  . Osteoarthritis of ankle, right 01/29/2018  . Disorder of tendon of posterior tibial muscle 07/06/2017  . Sleep related headaches 09/07/2016  . Nightmares REM-sleep type 09/07/2016  . RLS (restless legs syndrome) 09/07/2016  . Snoring 09/07/2016  . Excessive daytime sleepiness 09/07/2016  . Migraine 07/28/2016  . Solitary pulmonary nodule 06/08/2016  . S/P knee replacement 05/05/2015  . Former smoker 07/10/2014  . Shortness of breath 06/07/2014  . Chest pain 06/07/2014  . Osteoarthritis 05/16/2014  . Alcoholic fatty liver 54/10/8117  . Chronic post-traumatic stress disorder (PTSD) 05/02/2014  . Sleep disorder 05/02/2014  . Family history of alcoholism  05/02/2014  . Macrocytosis 04/19/2014  . Fatigue 08/15/2013  . Thyroid nodule 09/17/2012  . Hypothyroid 07/21/2010  . Type II diabetes mellitus, well controlled ([place]) 06/23/2010  . Hypertension associated with diabetes ([place]) 06/23/2010  . Alcohol use disorder, severe, in sustained remission ([place]) 06/23/2010  . Hyperlipidemia associated with type 2 diabetes mellitus ([place]) 06/23/2010  . Morbid obesity ([place]) 06/23/2010  . Chronic depression 06/23/2010  . History of colonic polyps 04/12/2010   Myrene Galas, PTA 07/22/20 2:43 PM PHYSICAL THERAPY DISCHARGE SUMMARY  Visits from Start of Care: 9  Current functional level related to goals / functional outcomes: See above for current progress.  Pt is ready for D/C.   Remaining deficits: See above for current status.     Education / Equipment: HEP, Economist  Plan: Patient agrees to discharge.  Patient goals were met. Patient is being discharged due to meeting the stated rehab goals.  ?????        Sigurd Sos, PT 07/22/20 3:32 PM  [facility] [address] [place], [place], [address] Phone: [phone]   Fax:  [phone]  Name: [name] [name] MRN: [id number] Date of Birth: Jun 09, 1955

## 2020-07-27 ENCOUNTER — Ambulatory Visit: Payer: Medicare Other | Admitting: Cardiology

## 2020-07-28 ENCOUNTER — Other Ambulatory Visit: Payer: Self-pay

## 2020-07-28 ENCOUNTER — Encounter: Payer: Self-pay | Admitting: Family Medicine

## 2020-07-28 ENCOUNTER — Ambulatory Visit (INDEPENDENT_AMBULATORY_CARE_PROVIDER_SITE_OTHER): Payer: Medicare Other | Admitting: Family Medicine

## 2020-07-28 VITALS — BP 102/58 | HR 66 | Temp 98.2°F | Ht 66.0 in | Wt 222.8 lb

## 2020-07-28 DIAGNOSIS — E1159 Type 2 diabetes mellitus with other circulatory complications: Secondary | ICD-10-CM | POA: Diagnosis not present

## 2020-07-28 DIAGNOSIS — Z1231 Encounter for screening mammogram for malignant neoplasm of breast: Secondary | ICD-10-CM

## 2020-07-28 DIAGNOSIS — F1021 Alcohol dependence, in remission: Secondary | ICD-10-CM

## 2020-07-28 DIAGNOSIS — I152 Hypertension secondary to endocrine disorders: Secondary | ICD-10-CM

## 2020-07-28 DIAGNOSIS — E119 Type 2 diabetes mellitus without complications: Secondary | ICD-10-CM

## 2020-07-28 NOTE — Patient Instructions (Addendum)
Prevnar 20- new pneumonia shot but we are out today- consider next visit  Monitor blood pressure at home with goal less than 135/85 on the top side but on the bottom side would prefer to keep you above 110/55.  Update me in 2 weeks or sooner if you have symptoms such as lightheadedness or dizziness  Call [facility] to schedule for mammogram  [facility] [place],  [place]  [address] Main: [phone]  Recommended follow up: Return in about 4 months (around 11/27/2020) for follow up- or sooner if needed. with labs a few days ahead.

## 2020-07-28 NOTE — Progress Notes (Signed)
Phone [phone] In person visit   Subjective:   [name] [name] is a 65 y.o. year old very pleasant female patient who presents for/with See problem oriented charting Chief Complaint  Patient presents with  . Diabetes  . Hyperlipidemia  . Hypertension   This visit occurred during the SARS-CoV-2 public health emergency.  Safety protocols were in place, including screening questions prior to the visit, additional usage of staff PPE, and extensive cleaning of exam room while observing appropriate contact time as indicated for disinfecting solutions.   Past Medical History-  Patient Active Problem List   Diagnosis Date Noted  . Solitary pulmonary nodule 06/08/2016    Priority: High  . Alcoholic fatty liver 41/93/7902    Priority: High  . Type II diabetes mellitus, well controlled ([place]) 06/23/2010    Priority: High  . Alcohol use disorder, severe, in sustained remission ([place]) 06/23/2010    Priority: High  . Morbid obesity ([place]) 06/23/2010    Priority: High  . Chronic depression 06/23/2010    Priority: High  . Migraine 07/28/2016    Priority: Medium  . Hypothyroid 07/21/2010    Priority: Medium  . Hypertension associated with diabetes ([place]) 06/23/2010    Priority: Medium  . Hyperlipidemia associated with type 2 diabetes mellitus ([place]) 06/23/2010    Priority: Medium  . S/P knee replacement 05/05/2015    Priority: Low  . Former smoker 07/10/2014    Priority: Low  . Osteoarthritis 05/16/2014    Priority: Low  . Chronic post-traumatic stress disorder (PTSD) 05/02/2014    Priority: Low  . Sleep disorder 05/02/2014    Priority: Low  . Family history of alcoholism 05/02/2014    Priority: Low  . Macrocytosis 04/19/2014    Priority: Low  . Fatigue 08/15/2013    Priority: Low  . Thyroid nodule 09/17/2012    Priority: Low  . History of colonic polyps 04/12/2010    Priority: Low  . S/P revision of total knee, left 08/15/2019  . Preop cardiovascular exam 08/13/2019  .  Osteoarthritis of ankle, right 01/29/2018  . Disorder of tendon of posterior tibial muscle 07/06/2017  . Sleep related headaches 09/07/2016  . Nightmares REM-sleep type 09/07/2016  . RLS (restless legs syndrome) 09/07/2016  . Snoring 09/07/2016  . Excessive daytime sleepiness 09/07/2016  . Shortness of breath 06/07/2014  . Chest pain 06/07/2014    Medications- reviewed and updated Current Outpatient Medications  Medication Sig Dispense Refill  . aspirin EC 81 MG tablet Take 81 mg by mouth daily. Swallow whole.    . benazepril (LOTENSIN) 20 MG tablet Take 1 tablet (20 mg total) by mouth daily. 90 tablet 3  . blood glucose meter kit and supplies KIT Dispense based on patient and insurance preference. Use up to four times daily as directed. DX E11.9 1 each 0  . buPROPion (WELLBUTRIN XL) 150 MG 24 hr tablet Take 1 tablet (150 mg total) by mouth daily. 90 tablet 3  . Cholecalciferol (VITAMIN D HIGH POTENCY) 25 MCG (1000 UT) capsule Vitamin D    . CRANBERRY PO Take by mouth.    . escitalopram (LEXAPRO) 20 MG tablet Take 1 tablet (20 mg total) by mouth daily. 90 tablet 3  . glucose blood (ONETOUCH VERIO) test strip USE TO CHECK BLOOD SUGAR DAILY AND PRN 100 each 12  . HYDROcodone-acetaminophen (NORCO/VICODIN) 5-325 MG tablet Take 1 tablet by mouth every 6 (six) hours as needed.    Marland Kitchen levothyroxine (SYNTHROID) 75 MCG tablet Take 1 tablet (75 mcg  total) by mouth daily. 90 tablet 3  . metFORMIN (GLUCOPHAGE XR) 500 MG 24 hr tablet Take 2 tablets (1,000 mg total) by mouth in the morning and at bedtime. 360 tablet 3  . methocarbamol (ROBAXIN) 500 MG tablet methocarbamol 500 mg tablet  Take 1 tablet 3 times a day by oral route as needed.    . simvastatin (ZOCOR) 20 MG tablet Take 1 tablet (20 mg total) by mouth at bedtime. TAKE 1 TABLET BY MOUTH EVERYDAY AT BEDTIME 90 tablet 3  . tiZANidine (ZANAFLEX) 4 MG capsule Take 1 capsule (4 mg total) by mouth 3 (three) times daily as needed for muscle spasms.  40 capsule 0  . traZODone (DESYREL) 50 MG tablet Take 1 tablet (50 mg total) by mouth at bedtime. 90 tablet 3   No current facility-administered medications for this visit.     Objective:  BP (!) 102/58   Pulse 66   Temp 98.2 F (36.8 C) (Temporal)   Ht _0  (1.676 m)   Wt 222 lb 12.8 oz (101.1 kg)   SpO2 97%   BMI 35.96 kg/m  Gen: NAD, resting comfortably CV: RRR no murmurs rubs or gallops Lungs: CTAB no crackles, wheeze, rhonchi Ext: trace edema Skin: warm, dry     Assessment and Plan  # Social Updates- She will be goin to [place] soon with family. Recently had her teeth pulled last week-still having mild pain.  # Left Calf Pain/ growth-see last note.  Pain has resolved.  Growth not nearly as identifiable and had reassuring venous duplex to rule out DVT- continue to monitor-if any changes occur then she should alert [place]  #Pulmonary nodule- follows Dr. Ander Slade. Plans for scans every 2 years until 5 years of stability noted. Will still refer to lung cancer program but may be disqualified.  -Appears she was not disqualified- lung cancer screening program plans to repeat imaging November 2022  # Diabetes S: Medication: metformin 1000 mg extended release BID, Amaryl 4 mg CBGs- much less frequent- maybe once in a month since last A1c Exercise and diet-down 9 pounds from last visit Lab Results  Component Value Date   HGBA1C 6.0 07/21/2020   HGBA1C 5.3 04/08/2020   HGBA1C 6.1 (H) 12/04/2019   A/P: Diabetes remains well controlled despite stopping Amaryl-continue current medication.  We will send an A1c goal of 6.5 or less  #hypertension S: medication:Benazepril 20 mg- She did take her medicine today Home readings #s: Yesterday 130/74 and her temperature was 70 degrees. Denies lightheadedness or dizziness. BP Readings from Last 3 Encounters:  07/28/20 (!) 102/58  07/03/20 (!) 108/48  07/02/20 130/70  A/P: Perhaps mild over controlled. Continue current medications for  now. -monitor BP at home with goal less than 135/85- on bottom would prefer to keep you at 110/55- update on progress within 2 weeks-if it is typically running less than 110 may reduce to 10 mg- discussed reducing today but she just refilled a supply of 20 mg and wants to make sure that she needs lower dose first -Wonder if nearly 10 pounds weight loss has contributed  #hyperlipidemia S: Medication: Simvastatin 20 mg Lab Results  Component Value Date   CHOL 169 04/08/2020   HDL 66 04/08/2020   LDLCALC 87 04/08/2020   LDLDIRECT 158.6 02/26/2014   TRIG 88 04/08/2020   CHOLHDL 2.6 04/08/2020   A/P: Mild poor control with ideal LDL under 70-he is working on healthy eating/regular exercise to try to control this-has had some successful weight  loss-congratulated patient  # Depression S: Medication:Lexapro 20 mg, Wellbutrin 150 mg. Also regularly using trazodone for sleep or tizanidine for muscle spasms at night- methocarbamol 500 mg durig the day  A/P: Stable. Continue current medications. Full remission.  #hypothyroidism S: compliant On thyroid medication- levothyroxine 88 Mcg prior to 75 Mcg Lab Results  Component Value Date   TSH 2.05 07/21/2020  A/P: Stable. Continue current medications.     #Had been on Iron in the past- no anemia dspite stopping this.  Continue to monitor CBC  # seen by [facility] on 06/10/19- knee revision- recently fell and slight increase in pain but overall manageable  # Hammer toe surgery 04/14/20- patient reported she is doing well.  #Alcoholic fatty liver- LFTs ok in the past-unfortunately team did not release LFTs/CMP when she came by for labs but with prior levels looking good we opted to wait until next visit- continue to work on weight loss- it will be 7 years next year-she cannot believe how quickly time was passed.   # Obesity morbid with BMI over 35 and hypertension and diabetes S: Patient down 9 pounds from last visit.  She has tried to improve her  eating habits A/P: Congratulated patient on progress Encouraged need for healthy eating, regular exercise, weight loss.    #Screening mammogram-now the patient is on Medicare she would like to get set up with  for imaging for mammogram-orders were placed today for driver's location  Recommended follow up: Return in about 4 months (around 11/27/2020) for follow up- or sooner if needed. with labs a few days ahead.   Lab/Order associations:   ICD-10-CM   1. Type II diabetes mellitus, well controlled ([place])  E11.9 CBC With Differential/Platelet    COMPLETE METABOLIC PANEL WITH GFR    Hemoglobin A1c  2. Alcohol use disorder, severe, in sustained remission ([facility]) Chronic F10.21   3. Hypertension associated with diabetes ([place]) Chronic E11.59    I15.2   4. Encounter for screening mammogram for malignant neoplasm of breast  Z12.31 MM 3D SCREEN BREAST BILATERAL    I,Harris Phan,acting as a scribe for [name], MD.,have documented all relevant documentation on the behalf of [name], MD,as directed by  [name], MD while in the presence of [name], MD.   I, [name], MD, have reviewed all documentation for this visit. The documentation on 07/28/20 for the exam, diagnosis, procedures, and orders are all accurate and complete.   Return precautions advised.  [name], MD

## 2020-07-29 ENCOUNTER — Ambulatory Visit (HOSPITAL_BASED_OUTPATIENT_CLINIC_OR_DEPARTMENT_OTHER)
Admission: RE | Admit: 2020-07-29 | Discharge: 2020-07-29 | Disposition: A | Discharge status: Home / Self Care | Payer: Medicare Other | Source: Ambulatory Visit | Attending: Family Medicine | Admitting: Family Medicine

## 2020-07-29 DIAGNOSIS — Z1231 Encounter for screening mammogram for malignant neoplasm of breast: Secondary | ICD-10-CM | POA: Insufficient documentation

## 2020-08-20 ENCOUNTER — Ambulatory Visit: Payer: Medicare Other | Admitting: Family Medicine

## 2020-11-13 ENCOUNTER — Other Ambulatory Visit: Payer: Self-pay | Admitting: Neurological Surgery

## 2020-11-13 DIAGNOSIS — M542 Cervicalgia: Secondary | ICD-10-CM

## 2020-11-25 ENCOUNTER — Other Ambulatory Visit: Payer: Medicare Other

## 2020-11-30 ENCOUNTER — Ambulatory Visit: Payer: Medicare Other | Admitting: Family Medicine

## 2020-12-07 ENCOUNTER — Other Ambulatory Visit: Payer: Self-pay

## 2020-12-07 ENCOUNTER — Encounter: Payer: Self-pay | Admitting: Family Medicine

## 2020-12-07 DIAGNOSIS — R3589 Other polyuria: Secondary | ICD-10-CM

## 2020-12-07 DIAGNOSIS — R8290 Unspecified abnormal findings in urine: Secondary | ICD-10-CM

## 2020-12-07 NOTE — Progress Notes (Signed)
Phone [phone] In person visit   Subjective:   [name] [name] is a 65 y.o. year old very pleasant female patient who presents for/with See problem oriented charting Chief Complaint  Patient presents with   Follow-up    This visit occurred during the SARS-CoV-2 public health emergency.  Safety protocols were in place, including screening questions prior to the visit, additional usage of staff PPE, and extensive cleaning of exam room while observing appropriate contact time as indicated for disinfecting solutions.   Past Medical History-  Patient Active Problem List   Diagnosis Date Noted   Solitary pulmonary nodule 06/08/2016    Priority: 1.   Alcoholic fatty liver 24/23/5361    Priority: 1.   Type II diabetes mellitus, well controlled ([place]) 06/23/2010    Priority: 1.   Alcohol use disorder, severe, in sustained remission ([place]) 06/23/2010    Priority: 1.   Morbid obesity ([place]) 06/23/2010    Priority: 1.   Chronic depression 06/23/2010    Priority: 1.   Migraine 07/28/2016    Priority: 2.   Hypothyroid 07/21/2010    Priority: 2.   Hypertension associated with diabetes ([place]) 06/23/2010    Priority: 2.   Hyperlipidemia associated with type 2 diabetes mellitus ([place]) 06/23/2010    Priority: 2.   S/P knee replacement 05/05/2015    Priority: 3.   Former smoker 07/10/2014    Priority: 3.   Osteoarthritis 05/16/2014    Priority: 3.   Chronic post-traumatic stress disorder (PTSD) 05/02/2014    Priority: 3.   Sleep disorder 05/02/2014    Priority: 3.   Family history of alcoholism 05/02/2014    Priority: 3.   Macrocytosis 04/19/2014    Priority: 3.   Fatigue 08/15/2013    Priority: 3.   Thyroid nodule 09/17/2012    Priority: 3.   History of colonic polyps 04/12/2010    Priority: 3.   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 Swallow whole.     blood glucose meter kit and supplies KIT Dispense based on patient and insurance preference. Use up to four times daily as directed. DX E11.9 1 each 0   buPROPion (WELLBUTRIN) 75 MG tablet Take 0.5 tablets (37.5 mg total) by mouth 2 (two) times daily. 30 tablet 0   Cholecalciferol (VITAMIN D HIGH POTENCY) 25 MCG (1000 UT) capsule Vitamin D     CRANBERRY PO Take by mouth.     escitalopram (LEXAPRO) 20 MG tablet Take 1 tablet (20 mg total) by mouth daily. 90 tablet 3   glucose blood (ONETOUCH VERIO) test strip USE TO CHECK BLOOD SUGAR DAILY AND PRN 100 each 12   levothyroxine (SYNTHROID) 75 MCG tablet Take 1 tablet (75 mcg total) by mouth daily. 90 tablet 3   metFORMIN (GLUCOPHAGE XR) 500 MG 24 hr tablet Take 2 tablets (1,000 mg total) by mouth in the morning and at bedtime. 360 tablet 3   methocarbamol (ROBAXIN) 500 MG tablet methocarbamol 500  mg tablet  Take 1 tablet 3 times a day by oral route as needed.     omeprazole (PRILOSEC) 40 MG capsule Take 1 capsule (40 mg total) by mouth daily. 30 capsule 1   simvastatin (ZOCOR) 20 MG tablet Take 1 tablet (20 mg total) by mouth at bedtime. TAKE 1 TABLET BY MOUTH EVERYDAY AT BEDTIME 90 tablet 3   sulfamethoxazole-trimethoprim (BACTRIM DS) 800-160 MG tablet Take 1 tablet by mouth 2 (two) times daily. 10 tablet 0   tiZANidine (ZANAFLEX) 4 MG capsule Take 1 capsule (4 mg total) by mouth 3 (three) times daily as needed for muscle spasms. 40 capsule 0   traZODone (DESYREL) 50 MG tablet Take 1 tablet (50 mg total) by mouth at bedtime. 90  tablet 3   benazepril (LOTENSIN) 10 MG tablet Take 1 tablet (10 mg total) by mouth daily. 90 tablet 3   No current facility-administered medications for this visit.     Objective:  BP (!) 104/58 (BP Location: Left Arm, Patient Position: Sitting, Cuff Size: Large)   Pulse 63   Temp 97.6 F (36.4 C) (Temporal)   Ht '5\' 6"'  (1.676 m)   Wt 221 lb 9.6 oz (100.5 kg)   SpO2 98%   BMI 35.77 kg/m  Gen: NAD, resting comfortably CV: RRR no murmurs rubs or gallops Lungs: CTAB no crackles, wheeze, rhonchi Abdomen: soft/mild epigastric pain but otherwise nontender/nondistended/normal bowel sounds. No rebound or guarding.  Ext: no edema Skin: warm, dry     Assessment and Plan   # ED visit for COVID symptoms - Pt was seen in ED for 09/08/20 presented with covid symptoms- after traveling to [place]. Pt was covid positive swab and reported nasal congestion and body aches. Treated with paxlovid. Patient reports still with some mild intermittent cough but otherwise has recovered.  -could consider bivalent in November  #neck and back pain- CT with Dr. Ronnald Ramp and has visit tomorrow to discuss    #Pulmonary nodule- patient followed by lung cancer screening program- next visit in November  #Concern for UTI S: Patients symptoms started 1 week.  Complains of dysuria: yes; polyuria: yes; nocturia: increased; urgency: yes.  Symptoms are largely stable.  ROS- no fever, chills, nausea, vomiting. Mild flank pain. Blood when she wiped at beginning- now better.  A/P: UA concerning for UTI. Likely UTI. Will get culture. Empiric treatment with: bactrim DS Patient to follow up if new or worsening symptoms or failure to improve.   # Epigastric abdominal pain S:mild low level pain at all times 3/10- with spicy foods hurts more- has cut back on these.  At least a year of issues. Not taking anything right now.  A/P: Possibly GERD or gastric ulcer-we will treat with omeprazole 40 mg for 2 months-if no improvement at  1 month we will get an ultrasound or if symptoms fail to completely resolve by 2 months particularly with prior alcohol intake history  # Diabetes S: Medication: metformin 1000 mg extended release BID -discontinued Amaryl 4 mg Exercise and diet- doing some limited Lab Results  Component Value Date   HGBA1C 5.5 12/10/2020   HGBA1C 6.0 07/21/2020   HGBA1C 5.3 04/08/2020   A/P: well controlled- continue current meds- discussed possibly reducing but she wants to hold study    #hypertension S: medication:Benazepril 20 mg daily BP Readings from Last 3 Encounters:  12/14/20 (!) 104/58  07/28/20 (!) 102/58  07/03/20 (!) 108/48  A/P: Blood pressure remains incredibly well controlled-we discussed reducing to 10 mg  of benazepril-he is in agreement to trial this-should let me know if blood pressures at home increase significantly prefer less than 135/85  #hyperlipidemia S: Medication: Simvastatin 20 mg at bedtime Lab Results  Component Value Date   CHOL 169 04/08/2020   HDL 66 04/08/2020   LDLCALC 87 04/08/2020   LDLDIRECT 158.6 02/26/2014   TRIG 88 04/08/2020   CHOLHDL 2.6 04/08/2020   A/P: Mild poor control-continue to work on healthy eating/regular exercise/weight loss-recheck in February or later-if not making further progress could consider increasing strength of statin  # Depression S: Medication:Lexapro 20 mg daily, Wellbutrin 150 mg daily . Also regularly used trazodone 50 mg at bedtime, for sleep or tizanidine 4 mg for muscle spasms at night- methocarbamol 500 mg durig the day -has noted more irritability. No SI.  A/P: No anhedonia or depressed mood-we will consider this full remission-continue to monitor -Is getting some irritability and sometimes this can be caused by Wellbutrin and of itself-we are going to attempt to remove Wellbutrin and see how she does-she will take half tablet of Wellbutrin 75 mg twice daily for 2 weeks and then stop   #hypothyroidism S: compliant On  thyroid medication- levothyroxine 75 Mcg daily - was 88 mcg Lab Results  Component Value Date   TSH 2.05 07/21/2020   A/P:Well-controlled-continue current medication   #Alcoholic fatty liver- LFTs was ok in the past including most recent labs-continue to monitor  Hepatic Function Latest Ref Rng & Units 12/10/2020 04/08/2020 12/04/2019  Total Protein 6.1 - 8.1 g/dL 5.5(L) 6.5 6.1  Albumin 3.8 - 4.8 g/dL - 4.0 3.7(L)  AST 10 - 35 U/L '12 16 15  ' ALT 6 - 29 U/L '9 8 11  ' Alk Phosphatase 44 - 121 IU/L - 77 80  Total Bilirubin 0.2 - 1.2 mg/dL 0.9 1.1 0.9  Bilirubin, Direct 0.0 - 0.2 mg/dL - - -    # Obesity morbid with BMI over 35 and hypertension and diabetes S: not doing regular exercise. Really enjoys chocolate which is a barrier- that is a downfall for her.  A/P: Still technically morbidly obese with BMI over 35 and hypertension and hyperlipidemia-continue work on lifestyle.   Encouraged need for healthy eating, regular exercise, weight loss.    Recommended follow up: Return in about 4 months (around 04/16/2021) for physical or sooner if needed.  Lab/Order associations:   ICD-10-CM   1. Hypertension associated with diabetes ([place])  E11.59 Comprehensive metabolic panel   T46.5 CBC with Differential/Platelet    Lipid panel    2. Hyperlipidemia associated with type 2 diabetes mellitus ([facility])  E11.69 Comprehensive metabolic panel   K81.2 CBC with Differential/Platelet    Lipid panel    3. Type II diabetes mellitus, well controlled ([place])  E11.9 Comprehensive metabolic panel    CBC with Differential/Platelet    Hemoglobin A1c    4. Major depressive disorder with single episode, in full remission ([place])  F32.5     5. Hypothyroidism, unspecified type  E03.9 TSH    6. Polyuria  R35.89 Urine Culture    7. Morbid obesity ([place]) Chronic E66.01     8. Need for immunization against influenza  Z23 Flu Vaccine QUAD High Dose([facility])      Meds ordered this encounter  Medications    sulfamethoxazole-trimethoprim (BACTRIM DS) 800-160 MG tablet    Sig: Take 1 tablet by mouth 2 (two) times daily.    Dispense:  10 tablet    Refill:  0   omeprazole (PRILOSEC) 40  MG capsule    Sig: Take 1 capsule (40 mg total) by mouth daily.    Dispense:  30 capsule    Refill:  1   benazepril (LOTENSIN) 10 MG tablet    Sig: Take 1 tablet (10 mg total) by mouth daily.    Dispense:  90 tablet    Refill:  3   buPROPion (WELLBUTRIN) 75 MG tablet    Sig: Take 0.5 tablets (37.5 mg total) by mouth 2 (two) times daily.    Dispense:  30 tablet    Refill:  0   I,Jada Bradford,acting as a scribe for [name], MD.,have documented all relevant documentation on the behalf of [name], MD,as directed by  [name], MD while in the presence of [name], MD.  I, [name], MD, have reviewed all documentation for this visit. The documentation on 12/14/20 for the exam, diagnosis, procedures, and orders are all accurate and complete.  Return precautions advised.  [name], MD

## 2020-12-09 ENCOUNTER — Ambulatory Visit
Admission: RE | Admit: 2020-12-09 | Discharge: 2020-12-09 | Disposition: A | Discharge status: Home / Self Care | Payer: Medicare Other | Source: Ambulatory Visit | Attending: Neurological Surgery | Admitting: Neurological Surgery

## 2020-12-09 ENCOUNTER — Other Ambulatory Visit: Payer: Self-pay

## 2020-12-09 DIAGNOSIS — M542 Cervicalgia: Secondary | ICD-10-CM

## 2020-12-10 ENCOUNTER — Other Ambulatory Visit (INDEPENDENT_AMBULATORY_CARE_PROVIDER_SITE_OTHER): Payer: Medicare Other

## 2020-12-10 DIAGNOSIS — R3589 Other polyuria: Secondary | ICD-10-CM

## 2020-12-10 DIAGNOSIS — E119 Type 2 diabetes mellitus without complications: Secondary | ICD-10-CM

## 2020-12-10 LAB — CBC WITH DIFFERENTIAL/PLATELET
Basophils Absolute: 0 10*3/uL (ref 0.0–0.1)
Basophils Relative: 0.3 % (ref 0.0–3.0)
Eosinophils Absolute: 0.1 10*3/uL (ref 0.0–0.7)
Eosinophils Relative: 1.3 % (ref 0.0–5.0)
HCT: 39 % (ref 36.0–46.0)
Hemoglobin: 13.7 g/dL (ref 12.0–15.0)
Lymphocytes Relative: 49.7 % — ABNORMAL HIGH (ref 12.0–46.0)
Lymphs Abs: 2.9 10*3/uL (ref 0.7–4.0)
MCHC: 35 g/dL (ref 30.0–36.0)
MCV: 94.9 fl (ref 78.0–100.0)
Monocytes Absolute: 0.6 10*3/uL (ref 0.1–1.0)
Monocytes Relative: 10 % (ref 3.0–12.0)
Neutro Abs: 2.2 10*3/uL (ref 1.4–7.7)
Neutrophils Relative %: 38.7 % — ABNORMAL LOW (ref 43.0–77.0)
Platelets: 197 10*3/uL (ref 150.0–400.0)
RBC: 4.1 Mil/uL (ref 3.87–5.11)
RDW: 14 % (ref 11.5–15.5)
WBC: 5.8 10*3/uL (ref 4.0–10.5)

## 2020-12-10 LAB — POCT URINALYSIS DIPSTICK
Bilirubin, UA: NEGATIVE
Blood, UA: POSITIVE
Glucose, UA: NEGATIVE
Ketones, UA: NEGATIVE
Nitrite, UA: NEGATIVE
Protein, UA: POSITIVE — AB
Spec Grav, UA: 1.025 (ref 1.010–1.025)
Urobilinogen, UA: 0.2 E.U./dL
pH, UA: 6 (ref 5.0–8.0)

## 2020-12-10 LAB — HEMOGLOBIN A1C: Hgb A1c MFr Bld: 5.5 % (ref 4.6–6.5)

## 2020-12-11 ENCOUNTER — Encounter: Payer: Self-pay | Admitting: Family Medicine

## 2020-12-11 LAB — COMPLETE METABOLIC PANEL WITH GFR
AG Ratio: 1.8 (calc) (ref 1.0–2.5)
ALT: 9 U/L (ref 6–29)
AST: 12 U/L (ref 10–35)
Albumin: 3.5 g/dL — ABNORMAL LOW (ref 3.6–5.1)
Alkaline phosphatase (APISO): 49 U/L (ref 37–153)
BUN: 15 mg/dL (ref 7–25)
CO2: 25 mmol/L (ref 20–32)
Calcium: 9.3 mg/dL (ref 8.6–10.4)
Chloride: 107 mmol/L (ref 98–110)
Creat: 0.96 mg/dL (ref 0.50–1.05)
Globulin: 2 g/dL (calc) (ref 1.9–3.7)
Glucose, Bld: 119 mg/dL — ABNORMAL HIGH (ref 65–99)
Potassium: 4.2 mmol/L (ref 3.5–5.3)
Sodium: 141 mmol/L (ref 135–146)
Total Bilirubin: 0.9 mg/dL (ref 0.2–1.2)
Total Protein: 5.5 g/dL — ABNORMAL LOW (ref 6.1–8.1)
eGFR: 65 mL/min/{1.73_m2} (ref >=60)

## 2020-12-14 ENCOUNTER — Ambulatory Visit (INDEPENDENT_AMBULATORY_CARE_PROVIDER_SITE_OTHER): Payer: Medicare Other | Admitting: Family Medicine

## 2020-12-14 ENCOUNTER — Other Ambulatory Visit: Payer: Self-pay

## 2020-12-14 ENCOUNTER — Encounter: Payer: Self-pay | Admitting: Family Medicine

## 2020-12-14 VITALS — BP 104/58 | HR 63 | Temp 97.6°F | Ht 66.0 in | Wt 221.6 lb

## 2020-12-14 DIAGNOSIS — E1159 Type 2 diabetes mellitus with other circulatory complications: Secondary | ICD-10-CM

## 2020-12-14 DIAGNOSIS — I152 Hypertension secondary to endocrine disorders: Secondary | ICD-10-CM

## 2020-12-14 DIAGNOSIS — F32A Depression, unspecified: Secondary | ICD-10-CM

## 2020-12-14 DIAGNOSIS — E1169 Type 2 diabetes mellitus with other specified complication: Secondary | ICD-10-CM | POA: Diagnosis not present

## 2020-12-14 DIAGNOSIS — E119 Type 2 diabetes mellitus without complications: Secondary | ICD-10-CM

## 2020-12-14 DIAGNOSIS — E785 Hyperlipidemia, unspecified: Secondary | ICD-10-CM

## 2020-12-14 DIAGNOSIS — F325 Major depressive disorder, single episode, in full remission: Secondary | ICD-10-CM | POA: Diagnosis not present

## 2020-12-14 DIAGNOSIS — Z23 Encounter for immunization: Secondary | ICD-10-CM

## 2020-12-14 DIAGNOSIS — R3589 Other polyuria: Secondary | ICD-10-CM

## 2020-12-14 DIAGNOSIS — E039 Hypothyroidism, unspecified: Secondary | ICD-10-CM

## 2020-12-14 MED ORDER — SULFAMETHOXAZOLE-TRIMETHOPRIM 800-160 MG PO TABS: 1.0000 | ORAL_TABLET | Freq: Two times a day (BID) | ORAL | 0 refills | Status: DC

## 2020-12-14 MED ORDER — BUPROPION HCL 75 MG PO TABS: 37.5000 mg | ORAL_TABLET | Freq: Two times a day (BID) | ORAL | 0 refills | Status: DC

## 2020-12-14 MED ORDER — BENAZEPRIL HCL 10 MG PO TABS: 10.0000 mg | ORAL_TABLET | Freq: Every day | ORAL | 3 refills | Status: DC

## 2020-12-14 MED ORDER — OMEPRAZOLE 40 MG PO CPDR: 40.0000 mg | DELAYED_RELEASE_CAPSULE | Freq: Every day | ORAL | 1 refills | Status: DC

## 2020-12-14 NOTE — Patient Instructions (Addendum)
Thanks for doing flu shot  Please consider getting new bivalent booster shot in November.  Please take Bactrim 800-160 mg twice daily.Please update me for new or worsening or persistent symptoms.    Take Omeprazole 40 mg for up to 2 months. If not improving within a month, we will get an ultrasound of liver/right upper quadrant  We are reducing Benazepril to 10 mg. Cut your current pills in half until finished. Please update me about your blood pressure at home. Blood pressure should average <135/85.   Take an half tablet of Wellbutrin 75 mg twice daily for two weeks then stop.   Urine [place] before you leave, NO BLOOD WORK!  Recommended follow up: 4 months (around 04/16/2021) for a follow up or sooner if needed.Schedule fasting labs 04/08/21 or later and follow-up visit  afterwards

## 2020-12-16 LAB — URINE CULTURE
MICRO NUMBER:: 12542150
SPECIMEN QUALITY:: ADEQUATE

## 2020-12-22 ENCOUNTER — Other Ambulatory Visit: Payer: Self-pay | Admitting: Neurological Surgery

## 2021-01-06 ENCOUNTER — Other Ambulatory Visit: Payer: Self-pay

## 2021-01-06 ENCOUNTER — Ambulatory Visit: Payer: Medicare Other | Attending: Internal Medicine

## 2021-01-06 DIAGNOSIS — Z23 Encounter for immunization: Secondary | ICD-10-CM

## 2021-01-07 ENCOUNTER — Other Ambulatory Visit: Payer: Self-pay | Admitting: *Deleted

## 2021-01-07 ENCOUNTER — Other Ambulatory Visit (HOSPITAL_BASED_OUTPATIENT_CLINIC_OR_DEPARTMENT_OTHER): Payer: Self-pay

## 2021-01-07 DIAGNOSIS — Z87891 Personal history of nicotine dependence: Secondary | ICD-10-CM

## 2021-01-07 MED ORDER — MODERNA COVID-19 BIVAL BOOSTER 50 MCG/0.5ML IM SUSP
INTRAMUSCULAR | 0 refills | Status: DC
  Filled 2021-01-07: qty 0.5, 1d supply, fill #0

## 2021-01-07 NOTE — Progress Notes (Signed)
   Covid-19 Vaccination Clinic  Name:  [name] [name]    MRN: [id number] DOB: 05-09-1955  01/07/2021  [name] was observed post Covid-19 immunization for 15 minutes without incident. She was provided with Vaccine Information Sheet and instruction to access the V-Safe system.   Ms. West was instructed to call 911 with any severe reactions post vaccine: Difficulty breathing  Swelling of face and throat  A fast heartbeat  A bad rash all over body  Dizziness and weakness   Immunizations Administered     Name Date Dose VIS Date Route   Moderna Covid-19 vaccine Bivalent Booster 01/06/2021  2:06 PM 0.5 mL 10/03/2020 Intramuscular   Manufacturer: Moderna   Lot: [id number]   [place]: [id number]

## 2021-01-08 NOTE — Progress Notes (Signed)
Surgical Instructions    Your procedure is scheduled on 01/18/21.  Report to [facility] Main Entrance "A" at 5:30 A.M., then check in with the Admitting office.  Call this number if you have problems the morning of surgery:  [phone]   If you have any questions prior to your surgery date call [phone]: Open Monday-Friday 8am-4pm    Remember:  Do not eat after midnight the night before your surgery  You may drink clear liquids until 4:30am the morning of your surgery.   Clear liquids allowed are: Water, Non-Citrus Juices (without pulp), Carbonated Beverages, Clear Tea, Black Coffee ONLY (NO MILK, CREAM OR POWDERED CREAMER of any kind), and Gatorade    Take these medicines the morning of surgery with A SIP OF WATER  buPROPion (WELLBUTRIN)  escitalopram (LEXAPRO) omeprazole (PRILOSEC)   IF NEEDED: methocarbamol (ROBAXIN)  As of today, STOP taking any Aspirin (unless otherwise instructed by your surgeon) Aleve, Naproxen, Ibuprofen, Motrin, Advil, Goody's, BC's, all herbal medications, fish oil, and all vitamins.  WHAT DO I DO ABOUT MY DIABETES MEDICATION?   Do not take oral diabetes medicines (pills) the morning of surgery.      THE MORNING OF SURGERY, do not take metFORMIN (GLUCOPHAGE XR).  The day of surgery, do not take other diabetes injectables, including Byetta (exenatide), Bydureon (exenatide ER), Victoza (liraglutide), or Trulicity (dulaglutide).  If your CBG is greater than 220 mg/dL, you may take  of your sliding scale (correction) dose of insulin.   HOW TO MANAGE YOUR DIABETES BEFORE AND AFTER SURGERY  Why is it important to control my blood sugar before and after surgery? Improving blood sugar levels before and after surgery helps healing and can limit problems. A way of improving blood sugar control is eating a healthy diet by:  Eating less sugar and carbohydrates  Increasing activity/exercise  Talking with your doctor about reaching your blood sugar  goals High blood sugars (greater than 180 mg/dL) can raise your risk of infections and slow your recovery, so you will need to focus on controlling your diabetes during the weeks before surgery. Make sure that the doctor who takes care of your diabetes knows about your planned surgery including the date and location.  How do I manage my blood sugar before surgery? Check your blood sugar at least 4 times a day, starting 2 days before surgery, to make sure that the level is not too high or low.  Check your blood sugar the morning of your surgery when you wake up and every 2 hours until you get to the Short Stay unit.  If your blood sugar is less than 70 mg/dL, you will need to treat for low blood sugar: Do not take insulin. Treat a low blood sugar (less than 70 mg/dL) with  cup of clear juice (cranberry or apple), 4 glucose tablets, OR glucose gel. Recheck blood sugar in 15 minutes after treatment (to make sure it is greater than 70 mg/dL). If your blood sugar is not greater than 70 mg/dL on recheck, call [phone] for further instructions. Report your blood sugar to the short stay nurse when you get to Short Stay.  If you are admitted to the hospital after surgery: Your blood sugar will be checked by the staff and you will probably be given insulin after surgery (instead of oral diabetes medicines) to make sure you have good blood sugar levels. The goal for blood sugar control after surgery is 80-180 mg/dL.    After your COVID test  You are not required to quarantine however you are required to wear a well-fitting mask when you are out and around people not in your household.  If your mask becomes wet or soiled, replace with a new one.  Wash your hands often with soap and water for 20 seconds or clean your hands with an alcohol-based hand sanitizer that contains at least 60% alcohol.  Do not share personal items.  Notify your provider: if you are in close contact with someone who  has COVID  or if you develop a fever of 100.4 or greater, sneezing, cough, sore throat, shortness of breath or body aches.             Do not wear jewelry or makeup Do not wear lotions, powders, perfumes/colognes, or deodorant. Do not shave 48 hours prior to surgery.   Do not bring valuables to the hospital. DO Not wear nail polish, gel polish, artificial nails, or any other type of covering on natural nails including finger and toenails. If patients have artificial nails, gel coating, etc. that need to be removed by a nail salon, please have this removed prior to surgery or surgery may need to be canceled/delayed if the surgeon/ anesthesia feels like the patient is unable to be adequately monitored.             [facility] is not responsible for any belongings or valuables.  Do NOT Smoke (Tobacco/Vaping)  24 hours prior to your procedure  If you use a CPAP at night, you may bring your mask for your overnight stay.   Contacts, glasses, hearing aids, dentures or partials may not be worn into surgery, please bring cases for these belongings   For patients admitted to the hospital, discharge time will be determined by your treatment team.   Patients discharged the day of surgery will not be allowed to drive home, and someone needs to stay with them for 24 hours.  NO VISITORS WILL BE ALLOWED IN PRE-OP WHERE PATIENTS ARE PREPPED FOR SURGERY.  ONLY 1 SUPPORT PERSON MAY BE PRESENT IN THE WAITING ROOM WHILE YOU ARE IN SURGERY.  IF YOU ARE TO BE ADMITTED, ONCE YOU ARE IN YOUR ROOM YOU WILL BE ALLOWED TWO (2) VISITORS. 1 (ONE) VISITOR MAY STAY OVERNIGHT BUT MUST ARRIVE TO THE ROOM BY 8pm.  Minor children may have two parents present. Special consideration for safety and communication needs will be reviewed on a case by case basis.  Special instructions:    Oral Hygiene is also important to reduce your risk of infection.  Remember - BRUSH YOUR TEETH THE MORNING OF SURGERY WITH YOUR REGULAR  TOOTHPASTE   [facility]- Preparing For Surgery  Before surgery, you can play an important role. Because skin is not sterile, your skin needs to be as free of germs as possible. You can reduce the number of germs on your skin by washing with CHG (chlorahexidine gluconate) Soap before surgery.  CHG is an antiseptic cleaner which kills germs and bonds with the skin to continue killing germs even after washing.     Please do not use if you have an allergy to CHG or antibacterial soaps. If your skin becomes reddened/irritated stop using the CHG.  Do not shave (including legs and underarms) for at least 48 hours prior to first CHG shower. It is OK to shave your face.  Please follow these instructions carefully.     Shower the NIGHT BEFORE SURGERY and the MORNING OF SURGERY with CHG  Soap.   If you chose to wash your hair, wash your hair first as usual with your normal shampoo. After you shampoo, rinse your hair and body thoroughly to remove the shampoo.  Then [employer] and genitals (private parts) with your normal soap and rinse thoroughly to remove soap.  After that Use CHG Soap as you would any other liquid soap. You can apply CHG directly to the skin and wash gently with a scrungie or a clean washcloth.   Apply the CHG Soap to your body ONLY FROM THE NECK DOWN.  Do not use on open wounds or open sores. Avoid contact with your eyes, ears, mouth and genitals (private parts). Wash Face and genitals (private parts)  with your normal soap.   Wash thoroughly, paying special attention to the area where your surgery will be performed.  Thoroughly rinse your body with warm water from the neck down.  DO NOT shower/wash with your normal soap after using and rinsing off the CHG Soap.  Pat yourself dry with a CLEAN TOWEL.  Wear CLEAN PAJAMAS to bed the night before surgery  Place CLEAN SHEETS on your bed the night before your surgery  DO NOT SLEEP WITH PETS.   Day of Surgery: Take a shower with  CHG soap. Wear Clean/Comfortable clothing the morning of surgery Do not apply any deodorants/lotions.   Remember to brush your teeth WITH YOUR REGULAR TOOTHPASTE.   Please read over the following fact sheets that you were given.

## 2021-01-09 ENCOUNTER — Other Ambulatory Visit: Payer: Self-pay | Admitting: Family Medicine

## 2021-01-11 ENCOUNTER — Encounter (HOSPITAL_COMMUNITY)
Admission: RE | Admit: 2021-01-11 | Discharge: 2021-01-11 | Disposition: A | Discharge status: Home / Self Care | Payer: Medicare Other | Source: Ambulatory Visit | Attending: Neurological Surgery | Admitting: Neurological Surgery

## 2021-01-11 ENCOUNTER — Other Ambulatory Visit: Payer: Self-pay

## 2021-01-11 ENCOUNTER — Encounter (HOSPITAL_COMMUNITY): Payer: Self-pay

## 2021-01-11 VITALS — BP 119/63 | HR 70 | Temp 98.2°F | Resp 17 | Ht 66.0 in | Wt 221.2 lb

## 2021-01-11 DIAGNOSIS — Z01812 Encounter for preprocedural laboratory examination: Secondary | ICD-10-CM | POA: Diagnosis present

## 2021-01-11 DIAGNOSIS — Z01818 Encounter for other preprocedural examination: Secondary | ICD-10-CM

## 2021-01-11 LAB — BASIC METABOLIC PANEL
Anion gap: 9 (ref 5–15)
BUN: 13 mg/dL (ref 8–23)
CO2: 23 mmol/L (ref 22–32)
Calcium: 9.3 mg/dL (ref 8.9–10.3)
Chloride: 107 mmol/L (ref 98–111)
Creatinine, Ser: 0.9 mg/dL (ref 0.44–1.00)
GFR, Estimated: >60 mL/min (ref >60)
Glucose, Bld: 79 mg/dL (ref 70–99)
Potassium: 3.9 mmol/L (ref 3.5–5.1)
Sodium: 139 mmol/L (ref 135–145)

## 2021-01-11 LAB — GLUCOSE, CAPILLARY: Glucose-Capillary: 111 mg/dL — ABNORMAL HIGH (ref 70–99)

## 2021-01-11 LAB — SURGICAL PCR SCREEN
MRSA, PCR: NEGATIVE
Staphylococcus aureus: NEGATIVE

## 2021-01-11 LAB — CBC
HCT: 39.8 % (ref 36.0–46.0)
Hemoglobin: 13.6 g/dL (ref 12.0–15.0)
MCH: 33.6 pg (ref 26.0–34.0)
MCHC: 34.2 g/dL (ref 30.0–36.0)
MCV: 98.3 fL (ref 80.0–100.0)
Platelets: 188 10*3/uL (ref 150–400)
RBC: 4.05 MIL/uL (ref 3.87–5.11)
RDW: 13.2 % (ref 11.5–15.5)
WBC: 5.5 10*3/uL (ref 4.0–10.5)
nRBC: 0 % (ref 0.0–0.2)

## 2021-01-11 LAB — PROTIME-INR
INR: 1.1 (ref 0.8–1.2)
Prothrombin Time: 14.1 seconds (ref 11.4–15.2)

## 2021-01-11 LAB — TYPE AND SCREEN
ABO/RH(D): A POS
Antibody Screen: NEGATIVE

## 2021-01-11 NOTE — Progress Notes (Signed)
PCP: [name], MD Cardiologist: Minus Breeding, MD  EKG: 07/02/20 CXR: na ECHO: 12/15/17 Stress Test: 07/09/14 Cardiac Cath:denies  Fasting Blood Sugar- does not check glucose.  Checks Blood Sugar: rarely  OSA/CPAP: Yes, does not wear cpap.  Covid test DOS.  Unable to come 11/25 for testing will be out of town for the holiday.  Per Pam, test DOS.  Anesthesia Review: No  ASA: Last dose 12/15/20    Patient denies shortness of breath, fever, cough, and chest pain at PAT appointment.  Patient verbalized understanding of instructions provided today at the PAT appointment.  Patient asked to review instructions at home and day of surgery.

## 2021-01-18 ENCOUNTER — Other Ambulatory Visit: Payer: Self-pay

## 2021-01-18 ENCOUNTER — Observation Stay (HOSPITAL_COMMUNITY)
Admission: RE | Admit: 2021-01-18 | Discharge: 2021-01-19 | Disposition: A | Discharge status: Home / Self Care | Payer: Medicare Other | Attending: Neurological Surgery | Admitting: Neurological Surgery

## 2021-01-18 ENCOUNTER — Encounter (HOSPITAL_COMMUNITY)
Admission: RE | Disposition: A | Discharge status: Home / Self Care | Payer: Self-pay | Source: Home / Self Care | Attending: Neurological Surgery

## 2021-01-18 ENCOUNTER — Encounter (HOSPITAL_COMMUNITY): Payer: Self-pay | Admitting: Neurological Surgery

## 2021-01-18 ENCOUNTER — Ambulatory Visit (HOSPITAL_COMMUNITY): Payer: Medicare Other

## 2021-01-18 ENCOUNTER — Ambulatory Visit (HOSPITAL_COMMUNITY): Payer: Medicare Other | Admitting: Certified Registered Nurse Anesthetist

## 2021-01-18 DIAGNOSIS — M4802 Spinal stenosis, cervical region: Principal | ICD-10-CM | POA: Insufficient documentation

## 2021-01-18 DIAGNOSIS — E119 Type 2 diabetes mellitus without complications: Secondary | ICD-10-CM | POA: Insufficient documentation

## 2021-01-18 DIAGNOSIS — Z96653 Presence of artificial knee joint, bilateral: Secondary | ICD-10-CM | POA: Insufficient documentation

## 2021-01-18 DIAGNOSIS — Z79899 Other long term (current) drug therapy: Secondary | ICD-10-CM | POA: Diagnosis not present

## 2021-01-18 DIAGNOSIS — Z20822 Contact with and (suspected) exposure to covid-19: Secondary | ICD-10-CM | POA: Insufficient documentation

## 2021-01-18 DIAGNOSIS — Z981 Arthrodesis status: Secondary | ICD-10-CM

## 2021-01-18 DIAGNOSIS — Z87891 Personal history of nicotine dependence: Secondary | ICD-10-CM | POA: Diagnosis not present

## 2021-01-18 DIAGNOSIS — Z7984 Long term (current) use of oral hypoglycemic drugs: Secondary | ICD-10-CM | POA: Diagnosis not present

## 2021-01-18 DIAGNOSIS — M47892 Other spondylosis, cervical region: Secondary | ICD-10-CM | POA: Diagnosis not present

## 2021-01-18 DIAGNOSIS — E039 Hypothyroidism, unspecified: Secondary | ICD-10-CM | POA: Insufficient documentation

## 2021-01-18 DIAGNOSIS — Z419 Encounter for procedure for purposes other than remedying health state, unspecified: Secondary | ICD-10-CM

## 2021-01-18 DIAGNOSIS — Z7982 Long term (current) use of aspirin: Secondary | ICD-10-CM | POA: Insufficient documentation

## 2021-01-18 DIAGNOSIS — I10 Essential (primary) hypertension: Secondary | ICD-10-CM | POA: Insufficient documentation

## 2021-01-18 HISTORY — PX: ANTERIOR CERVICAL DECOMP/DISCECTOMY FUSION: SHX1161

## 2021-01-18 LAB — GLUCOSE, CAPILLARY
Glucose-Capillary: 95 mg/dL (ref 70–99)
Glucose-Capillary: 139 mg/dL — ABNORMAL HIGH (ref 70–99)
Glucose-Capillary: 163 mg/dL — ABNORMAL HIGH (ref 70–99)
Glucose-Capillary: 178 mg/dL — ABNORMAL HIGH (ref 70–99)
Glucose-Capillary: 261 mg/dL — ABNORMAL HIGH (ref 70–99)

## 2021-01-18 LAB — SARS CORONAVIRUS 2 BY RT PCR (HOSPITAL ORDER, PERFORMED IN ~~LOC~~ HOSPITAL LAB): SARS Coronavirus 2: NEGATIVE

## 2021-01-18 SURGERY — ANTERIOR CERVICAL DECOMPRESSION/DISCECTOMY FUSION 2 LEVELS
Anesthesia: General | Site: Spine Cervical

## 2021-01-18 MED ORDER — ONDANSETRON HCL 4 MG/2ML IJ SOLN: 4.0000 mg | Freq: Four times a day (QID) | INTRAMUSCULAR | Status: DC | PRN

## 2021-01-18 MED ORDER — ONDANSETRON HCL 4 MG/2ML IJ SOLN
INTRAMUSCULAR | Status: AC
  Filled 2021-01-18: qty 2

## 2021-01-18 MED ORDER — ROCURONIUM BROMIDE 10 MG/ML (PF) SYRINGE
PREFILLED_SYRINGE | INTRAVENOUS | Status: DC | PRN
  Administered 2021-01-18: 60 mg via INTRAVENOUS
  Administered 2021-01-18: 20 mg via INTRAVENOUS

## 2021-01-18 MED ORDER — DEXAMETHASONE SODIUM PHOSPHATE 10 MG/ML IJ SOLN
INTRAMUSCULAR | Status: DC | PRN
  Administered 2021-01-18: 10 mg via INTRAVENOUS

## 2021-01-18 MED ORDER — GABAPENTIN 300 MG PO CAPS
300.0000 mg | ORAL_CAPSULE | ORAL | Status: AC
  Administered 2021-01-18: 07:00:00 300 mg via ORAL
  Filled 2021-01-18: qty 1

## 2021-01-18 MED ORDER — LEVOTHYROXINE SODIUM 75 MCG PO TABS
75.0000 ug | ORAL_TABLET | Freq: Every evening | ORAL | Status: DC
Start: 2021-01-18 — End: 2021-01-19
  Administered 2021-01-18: 17:00:00 75 ug via ORAL
  Filled 2021-01-18: qty 1

## 2021-01-18 MED ORDER — BUPROPION HCL 75 MG PO TABS
75.0000 mg | ORAL_TABLET | Freq: Two times a day (BID) | ORAL | Status: DC
  Filled 2021-01-18 (×4): qty 1

## 2021-01-18 MED ORDER — ORAL CARE MOUTH RINSE: 15.0000 mL | Freq: Once | OROMUCOSAL | Status: AC

## 2021-01-18 MED ORDER — ONDANSETRON HCL 4 MG PO TABS: 4.0000 mg | ORAL_TABLET | Freq: Four times a day (QID) | ORAL | Status: DC | PRN

## 2021-01-18 MED ORDER — MIDAZOLAM HCL 2 MG/2ML IJ SOLN
INTRAMUSCULAR | Status: DC | PRN
Start: 2021-01-18 — End: 2021-01-18
  Administered 2021-01-18: 2 mg via INTRAVENOUS

## 2021-01-18 MED ORDER — DEXAMETHASONE SODIUM PHOSPHATE 4 MG/ML IJ SOLN: 4.0000 mg | Freq: Four times a day (QID) | INTRAMUSCULAR | Status: DC

## 2021-01-18 MED ORDER — LIDOCAINE 2% (20 MG/ML) 5 ML SYRINGE
INTRAMUSCULAR | Status: AC
  Filled 2021-01-18: qty 5

## 2021-01-18 MED ORDER — CHLORHEXIDINE GLUCONATE CLOTH 2 % EX PADS: 6.0000 | MEDICATED_PAD | Freq: Once | CUTANEOUS | Status: DC

## 2021-01-18 MED ORDER — 0.9 % SODIUM CHLORIDE (POUR BTL) OPTIME
TOPICAL | Status: DC | PRN
  Administered 2021-01-18: 09:00:00 1000 mL

## 2021-01-18 MED ORDER — CHLORHEXIDINE GLUCONATE 0.12 % MT SOLN
OROMUCOSAL | Status: AC
  Administered 2021-01-18: 07:00:00 15 mL
  Filled 2021-01-18: qty 15

## 2021-01-18 MED ORDER — ACETAMINOPHEN 325 MG PO TABS: 650.0000 mg | ORAL_TABLET | ORAL | Status: DC | PRN

## 2021-01-18 MED ORDER — EPHEDRINE SULFATE-NACL 50-0.9 MG/10ML-% IV SOSY
PREFILLED_SYRINGE | INTRAVENOUS | Status: DC | PRN
  Administered 2021-01-18: 10 mg via INTRAVENOUS

## 2021-01-18 MED ORDER — ESCITALOPRAM OXALATE 20 MG PO TABS
20.0000 mg | ORAL_TABLET | Freq: Every day | ORAL | Status: DC
  Filled 2021-01-18: qty 1

## 2021-01-18 MED ORDER — ONDANSETRON HCL 4 MG/2ML IJ SOLN
INTRAMUSCULAR | Status: DC | PRN
  Administered 2021-01-18: 4 mg via INTRAVENOUS

## 2021-01-18 MED ORDER — FENTANYL CITRATE (PF) 100 MCG/2ML IJ SOLN: 25.0000 ug | INTRAMUSCULAR | Status: DC | PRN

## 2021-01-18 MED ORDER — EPHEDRINE 5 MG/ML INJ
INTRAVENOUS | Status: AC
  Filled 2021-01-18: qty 5

## 2021-01-18 MED ORDER — ACETAMINOPHEN 500 MG PO TABS
1000.0000 mg | ORAL_TABLET | ORAL | Status: AC
  Administered 2021-01-18: 07:00:00 1000 mg via ORAL
  Filled 2021-01-18: qty 2

## 2021-01-18 MED ORDER — CEFAZOLIN SODIUM-DEXTROSE 2-4 GM/100ML-% IV SOLN
2.0000 g | Freq: Three times a day (TID) | INTRAVENOUS | Status: AC
  Administered 2021-01-18 (×2): 2 g via INTRAVENOUS
  Filled 2021-01-18 (×2): qty 100

## 2021-01-18 MED ORDER — BUPIVACAINE HCL (PF) 0.25 % IJ SOLN
INTRAMUSCULAR | Status: DC | PRN
  Administered 2021-01-18: 3 mL

## 2021-01-18 MED ORDER — THROMBIN 5000 UNITS EX SOLR: OROMUCOSAL | Status: DC | PRN

## 2021-01-18 MED ORDER — FENTANYL CITRATE (PF) 250 MCG/5ML IJ SOLN
INTRAMUSCULAR | Status: AC
  Filled 2021-01-18: qty 5

## 2021-01-18 MED ORDER — OXYCODONE HCL 5 MG/5ML PO SOLN: 5.0000 mg | Freq: Once | ORAL | Status: DC | PRN

## 2021-01-18 MED ORDER — DEXAMETHASONE 4 MG PO TABS
4.0000 mg | ORAL_TABLET | Freq: Four times a day (QID) | ORAL | Status: DC
  Administered 2021-01-18 – 2021-01-19 (×4): 4 mg via ORAL
  Filled 2021-01-18 (×4): qty 1

## 2021-01-18 MED ORDER — OXYCODONE HCL 5 MG PO TABS: 5.0000 mg | ORAL_TABLET | Freq: Once | ORAL | Status: DC | PRN

## 2021-01-18 MED ORDER — HEMOSTATIC AGENTS (NO CHARGE) OPTIME
TOPICAL | Status: DC | PRN
  Administered 2021-01-18: 1 via TOPICAL

## 2021-01-18 MED ORDER — ACETAMINOPHEN 650 MG RE SUPP: 650.0000 mg | RECTAL | Status: DC | PRN

## 2021-01-18 MED ORDER — MENTHOL 3 MG MT LOZG: 1.0000 | LOZENGE | OROMUCOSAL | Status: DC | PRN

## 2021-01-18 MED ORDER — THROMBIN 5000 UNITS EX SOLR
CUTANEOUS | Status: AC
  Filled 2021-01-18: qty 5000

## 2021-01-18 MED ORDER — PROPOFOL 10 MG/ML IV BOLUS
INTRAVENOUS | Status: AC
  Filled 2021-01-18: qty 20

## 2021-01-18 MED ORDER — PANTOPRAZOLE SODIUM 40 MG PO TBEC: 40.0000 mg | DELAYED_RELEASE_TABLET | Freq: Every day | ORAL | Status: DC

## 2021-01-18 MED ORDER — MIDAZOLAM HCL 2 MG/2ML IJ SOLN
INTRAMUSCULAR | Status: AC
  Filled 2021-01-18: qty 2

## 2021-01-18 MED ORDER — LIDOCAINE 2% (20 MG/ML) 5 ML SYRINGE
INTRAMUSCULAR | Status: DC | PRN
  Administered 2021-01-18: 60 mg via INTRAVENOUS

## 2021-01-18 MED ORDER — HYDROCODONE-ACETAMINOPHEN 7.5-325 MG PO TABS
1.0000 | ORAL_TABLET | ORAL | Status: DC | PRN
  Administered 2021-01-18 – 2021-01-19 (×4): 1 via ORAL
  Filled 2021-01-18 (×4): qty 1

## 2021-01-18 MED ORDER — MORPHINE SULFATE (PF) 2 MG/ML IV SOLN: 2.0000 mg | INTRAVENOUS | Status: DC | PRN

## 2021-01-18 MED ORDER — BENAZEPRIL HCL 5 MG PO TABS
10.0000 mg | ORAL_TABLET | Freq: Every day | ORAL | Status: DC
  Filled 2021-01-18 (×2): qty 2

## 2021-01-18 MED ORDER — INSULIN ASPART 100 UNIT/ML IJ SOLN
0.0000 [IU] | Freq: Three times a day (TID) | INTRAMUSCULAR | Status: DC
  Administered 2021-01-18 – 2021-01-19 (×3): 3 [IU] via SUBCUTANEOUS

## 2021-01-18 MED ORDER — PROPOFOL 10 MG/ML IV BOLUS
INTRAVENOUS | Status: DC | PRN
  Administered 2021-01-18: 140 mg via INTRAVENOUS
  Administered 2021-01-18: 40 mg via INTRAVENOUS

## 2021-01-18 MED ORDER — SENNA 8.6 MG PO TABS: 1.0000 | ORAL_TABLET | Freq: Two times a day (BID) | ORAL | Status: DC

## 2021-01-18 MED ORDER — POTASSIUM CHLORIDE IN NACL 20-0.9 MEQ/L-% IV SOLN: INTRAVENOUS | Status: DC

## 2021-01-18 MED ORDER — CEFAZOLIN SODIUM-DEXTROSE 2-4 GM/100ML-% IV SOLN
2.0000 g | INTRAVENOUS | Status: AC
  Administered 2021-01-18: 08:00:00 2 g via INTRAVENOUS
  Filled 2021-01-18: qty 100

## 2021-01-18 MED ORDER — LACTATED RINGERS IV SOLN: INTRAVENOUS | Status: DC

## 2021-01-18 MED ORDER — SODIUM CHLORIDE 0.9% FLUSH: 3.0000 mL | INTRAVENOUS | Status: DC | PRN

## 2021-01-18 MED ORDER — ROCURONIUM BROMIDE 10 MG/ML (PF) SYRINGE
PREFILLED_SYRINGE | INTRAVENOUS | Status: AC
  Filled 2021-01-18: qty 10

## 2021-01-18 MED ORDER — PHENOL 1.4 % MT LIQD: 1.0000 | OROMUCOSAL | Status: DC | PRN

## 2021-01-18 MED ORDER — SUGAMMADEX SODIUM 200 MG/2ML IV SOLN
INTRAVENOUS | Status: DC | PRN
  Administered 2021-01-18: 200 mg via INTRAVENOUS

## 2021-01-18 MED ORDER — BUPIVACAINE HCL (PF) 0.25 % IJ SOLN
INTRAMUSCULAR | Status: AC
  Filled 2021-01-18: qty 30

## 2021-01-18 MED ORDER — SODIUM CHLORIDE 0.9% FLUSH
3.0000 mL | Freq: Two times a day (BID) | INTRAVENOUS | Status: DC
  Administered 2021-01-18: 21:00:00 3 mL via INTRAVENOUS

## 2021-01-18 MED ORDER — SODIUM CHLORIDE 0.9 % IV SOLN: 250.0000 mL | INTRAVENOUS | Status: DC

## 2021-01-18 MED ORDER — DEXAMETHASONE SODIUM PHOSPHATE 10 MG/ML IJ SOLN
INTRAMUSCULAR | Status: AC
  Filled 2021-01-18: qty 1

## 2021-01-18 MED ORDER — BENAZEPRIL HCL 5 MG PO TABS
10.0000 mg | ORAL_TABLET | Freq: Every day | ORAL | Status: DC
  Administered 2021-01-18 – 2021-01-19 (×2): 10 mg via ORAL
  Filled 2021-01-18 (×2): qty 2

## 2021-01-18 MED ORDER — FENTANYL CITRATE (PF) 250 MCG/5ML IJ SOLN
INTRAMUSCULAR | Status: DC | PRN
  Administered 2021-01-18: 100 ug via INTRAVENOUS
  Administered 2021-01-18 (×3): 50 ug via INTRAVENOUS
  Administered 2021-01-18 (×2): 25 ug via INTRAVENOUS
  Administered 2021-01-18: 50 ug via INTRAVENOUS

## 2021-01-18 MED ORDER — CHLORHEXIDINE GLUCONATE 0.12 % MT SOLN: 15.0000 mL | Freq: Once | OROMUCOSAL | Status: AC

## 2021-01-18 MED ORDER — METFORMIN HCL ER 500 MG PO TB24
500.0000 mg | ORAL_TABLET | Freq: Every day | ORAL | Status: DC
  Administered 2021-01-19: 500 mg via ORAL
  Filled 2021-01-18: qty 1

## 2021-01-18 MED ORDER — SENNA 8.6 MG PO TABS
1.0000 | ORAL_TABLET | Freq: Two times a day (BID) | ORAL | Status: DC
  Administered 2021-01-18: 21:00:00 8.6 mg via ORAL
  Filled 2021-01-18: qty 1

## 2021-01-18 MED ORDER — INSULIN ASPART 100 UNIT/ML IJ SOLN
0.0000 [IU] | Freq: Every day | INTRAMUSCULAR | Status: DC
  Administered 2021-01-18: 21:00:00 3 [IU] via SUBCUTANEOUS

## 2021-01-18 MED ORDER — THROMBIN 5000 UNITS EX SOLR
CUTANEOUS | Status: DC | PRN
  Administered 2021-01-18 (×2): 5000 [IU] via TOPICAL

## 2021-01-18 MED ORDER — METHOCARBAMOL 500 MG PO TABS
500.0000 mg | ORAL_TABLET | Freq: Three times a day (TID) | ORAL | Status: DC | PRN
  Administered 2021-01-18 – 2021-01-19 (×3): 500 mg via ORAL
  Filled 2021-01-18 (×3): qty 1

## 2021-01-18 SURGICAL SUPPLY — 47 items
BAG COUNTER SPONGE SURGICOUNT (BAG) ×2 IMPLANT
BAND RUBBER #18 3X1/16 STRL (MISCELLANEOUS) ×4 IMPLANT
BASKET BONE COLLECTION (BASKET) IMPLANT
BENZOIN TINCTURE PRP APPL 2/3 (GAUZE/BANDAGES/DRESSINGS) ×2 IMPLANT
BUR CARBIDE MATCH 3.0 (BURR) ×2 IMPLANT
CANISTER SUCT 3000ML PPV (MISCELLANEOUS) ×2 IMPLANT
CLIP TI MEDIUM 6 (CLIP) ×2 IMPLANT
CLSR STERI-STRIP ANTIMIC 1/2X4 (GAUZE/BANDAGES/DRESSINGS) ×2 IMPLANT
DRAPE C-ARM 42X72 X-RAY (DRAPES) ×4 IMPLANT
DRAPE LAPAROTOMY 100X72 PEDS (DRAPES) ×2 IMPLANT
DRAPE MICROSCOPE LEICA (MISCELLANEOUS) ×2 IMPLANT
DRSG OPSITE POSTOP 4X6 (GAUZE/BANDAGES/DRESSINGS) ×2 IMPLANT
DURAPREP 6ML APPLICATOR 50/CS (WOUND CARE) ×2 IMPLANT
ELECT COATED BLADE 2.86 ST (ELECTRODE) ×2 IMPLANT
ELECT REM PT RETURN 9FT ADLT (ELECTROSURGICAL) ×2
ELECTRODE REM PT RTRN 9FT ADLT (ELECTROSURGICAL) ×1 IMPLANT
GAUZE 4X4 16PLY ~~LOC~~+RFID DBL (SPONGE) IMPLANT
GLOVE SURG ENC MOIS LTX SZ7 (GLOVE) ×4 IMPLANT
GLOVE SURG ENC MOIS LTX SZ8 (GLOVE) ×2 IMPLANT
GLOVE SURG UNDER POLY LF SZ7 (GLOVE) ×4 IMPLANT
GOWN STRL REUS W/ TWL LRG LVL3 (GOWN DISPOSABLE) ×2 IMPLANT
GOWN STRL REUS W/ TWL XL LVL3 (GOWN DISPOSABLE) ×1 IMPLANT
GOWN STRL REUS W/TWL 2XL LVL3 (GOWN DISPOSABLE) IMPLANT
GOWN STRL REUS W/TWL LRG LVL3 (GOWN DISPOSABLE) ×4
GOWN STRL REUS W/TWL XL LVL3 (GOWN DISPOSABLE) ×2
HEMOSTAT POWDER KIT SURGIFOAM (HEMOSTASIS) ×2 IMPLANT
KIT BASIN OR (CUSTOM PROCEDURE TRAY) ×2 IMPLANT
KIT TURNOVER KIT B (KITS) ×2 IMPLANT
NEEDLE HYPO 25X1 1.5 SAFETY (NEEDLE) ×2 IMPLANT
NEEDLE SPNL 20GX3.5 QUINCKE YW (NEEDLE) ×2 IMPLANT
NS IRRIG 1000ML POUR BTL (IV SOLUTION) ×2 IMPLANT
PACK LAMINECTOMY NEURO (CUSTOM PROCEDURE TRAY) ×2 IMPLANT
PAD ARMBOARD 7.5X6 YLW CONV (MISCELLANEOUS) ×8 IMPLANT
PIN DISTRACTION 14MM (PIN) ×4 IMPLANT
PLATE ACP INSIGNIA 40 2L (Plate) ×2 IMPLANT
PUTTY BONE 1CC (Putty) ×2 IMPLANT
SCREW VA SINGLE LEAD 4X14 (Screw) ×12 IMPLANT
SCREW VA SINGLE LEAD 4X14 ST (Screw) ×6 IMPLANT
SPACER NANOTEC 7X16X14 7D (Spacer) ×4 IMPLANT
SPONGE INTESTINAL PEANUT (DISPOSABLE) ×2 IMPLANT
SPONGE SURGIFOAM ABS GEL SZ50 (HEMOSTASIS) ×2 IMPLANT
STRIP CLOSURE SKIN 1/2X4 (GAUZE/BANDAGES/DRESSINGS) ×2 IMPLANT
SUT VIC AB 3-0 SH 8-18 (SUTURE) ×4 IMPLANT
SUT VICRYL 4-0 PS2 18IN ABS (SUTURE) IMPLANT
TOWEL GREEN STERILE (TOWEL DISPOSABLE) ×2 IMPLANT
TOWEL GREEN STERILE FF (TOWEL DISPOSABLE) ×2 IMPLANT
WATER STERILE IRR 1000ML POUR (IV SOLUTION) ×2 IMPLANT

## 2021-01-18 NOTE — H&P (Signed)
Subjective:   Patient is a 65 y.o. female admitted for acdf. The patient first presented to me with complaints of neck pain. Onset of symptoms was several months ago. The pain is described as aching and occurs all day. The pain is rated severe, and is located in the neck and radiates to the shoulders and sometimes arms. The symptoms have been progressive. Symptoms are exacerbated by extending head backwards, and are relieved by none.  Previous work up includes MRI of cervical spine, results: spinal stenosis.  Past Medical History:  Diagnosis Date   Acute meniscal tear of knee    Left knee   Alcohol problem drinking    rehab   Anemia    menstrual related   Anxiety    Arthritis    right ankle-neuropathy   Colon polyps    Depression    DM (diabetes mellitus), type 2 ([place]) 12/2009   type 2   Dyspnea    Evalluate for OSA (obstructive sleep apnea) 08/15/2013   No OSA on sleep study but may be underestimated.-never used cpap     GERD (gastroesophageal reflux disease)    not currently taking.   Hyperlipidemia    Hypertension    Hypothyroidism    Liver disease    Neuromuscular disorder ([place])    neropathy bilateral feet.   Thyroid disease     Past Surgical History:  Procedure Laterality Date   ADENOIDECTOMY     BREAST SURGERY Right    lumpectomy-benign   CESAREAN SECTION  1987   1 time   CHOLECYSTECTOMY     HAMMER TOE SURGERY     Dr. Doran Durand [facility]- feb 2022   HAMMERTOE RECONSTRUCTION WITH WEIL OSTEOTOMY Bilateral 04/02/2020   Procedure: Bilateral 2-3 Weil and hammertoe corrections;  Surgeon: Wylene Simmer, MD;  Location: [facility];  Service: Orthopedics;  Laterality: Bilateral;   INNER EAR SURGERY     left ear had tubes put in and removed, scar tissue built up/ loss of hearing in left ear for over a year   MASS EXCISION Left 12/16/2013   Procedure: EXCISION LEFT  DISTAL THIGH MASS;  Surgeon: Mauri Pole, MD;  Location: [facility];  Service: Orthopedics;   Laterality: Left;   right ankle surgery      x 2- no retained hardware   right rotator cuff surgery      left side   TONSILLECTOMY     TOTAL KNEE ARTHROPLASTY  2009   right   TOTAL KNEE ARTHROPLASTY Left 05/05/2015   Procedure: LEFT TOTAL KNEE ARTHROPLASTY;  Surgeon: Paralee Cancel, MD;  Location: [facility];  Service: Orthopedics;  Laterality: Left;   TOTAL KNEE REVISION Right 12/16/2013   Procedure: RIGHT TOTAL KNEE REVISION POLY EXCHANGE;  Surgeon: Mauri Pole, MD;  Location: [facility];  Service: Orthopedics;  Laterality: Right;   TOTAL KNEE REVISION Left 08/15/2019   Procedure: LEFT KNEE REVISION;  Surgeon: Paralee Cancel, MD;  Location: [facility];  Service: Orthopedics;  Laterality: Left;  2 hrs   TUBAL LIGATION  1987    Allergies  Allergen Reactions   Adhesive [Tape] Other (See Comments)    blisters   Nitrofurantoin     Diffuse rash   Other Other (See Comments)    (Neoprene)--causes blisters.     Social History   Tobacco Use   Smoking status: Former    Packs/day: 1.50    Years: 30.00    Pack years: 45.00    Types: Cigarettes  Start date: 60    Quit date: 02/21/2009    Years since quitting: 11.9   Smokeless tobacco: Never  Substance Use Topics   Alcohol use: Not Currently    Family History  Adopted: Yes   Prior to Admission medications   Medication Sig Start Date End Date Taking? Authorizing Provider  aspirin EC 81 MG tablet Take 81 mg by mouth daily. Swallow whole.   Yes [provider]  benazepril (LOTENSIN) 10 MG tablet Take 1 tablet (10 mg total) by mouth daily. 12/14/20  Yes Marin Olp, MD  buPROPion [facility]) 75 MG tablet TAKE 1/2 TABLET BY MOUTH TWO TIMES A DAY 01/11/21  Yes Marin Olp, MD  Cholecalciferol (VITAMIN D HIGH POTENCY) 25 MCG (1000 UT) capsule Take 1,000 Units by mouth daily.   Yes [provider]  CRANBERRY PO Take 1 tablet by mouth daily.   Yes [provider]  escitalopram (LEXAPRO) 20 MG tablet Take 1  tablet (20 mg total) by mouth daily. 05/11/20  Yes Marin Olp, MD  levothyroxine (SYNTHROID) 75 MCG tablet Take 1 tablet (75 mcg total) by mouth daily. Patient taking differently: Take 75 mcg by mouth every evening. 05/11/20  Yes Marin Olp, MD  metFORMIN (GLUCOPHAGE XR) 500 MG 24 hr tablet Take 2 tablets (1,000 mg total) by mouth in the morning and at bedtime. 05/11/20  Yes Marin Olp, MD  methocarbamol (ROBAXIN) 500 MG tablet Take 500 mg by mouth every 8 (eight) hours as needed for muscle spasms.   Yes [provider]  omeprazole (PRILOSEC) 40 MG capsule Take 1 capsule (40 mg total) by mouth daily. 12/14/20  Yes Marin Olp, MD  simvastatin (ZOCOR) 20 MG tablet Take 1 tablet (20 mg total) by mouth at bedtime. TAKE 1 TABLET BY MOUTH EVERYDAY AT BEDTIME 05/11/20  Yes Marin Olp, MD  tiZANidine (ZANAFLEX) 4 MG capsule Take 1 capsule (4 mg total) by mouth 3 (three) times daily as needed for muscle spasms. Patient taking differently: Take 4 mg by mouth every evening. 08/16/19  Yes Babish, Rodman Key, PA-C  traZODone (DESYREL) 50 MG tablet Take 1 tablet (50 mg total) by mouth at bedtime. 05/11/20  Yes Marin Olp, MD  blood glucose meter kit and supplies KIT Dispense based on patient and insurance preference. Use up to four times daily as directed. DX E11.9 08/09/19   Marin Olp, MD  COVID-19 mRNA bivalent vaccine, Moderna, (MODERNA COVID-19 BIVAL BOOSTER) 50 MCG/0.5ML injection Inject into the muscle. 01/07/21   Carlyle Basques, MD  glucose blood [facility] VERIO) test strip USE TO CHECK BLOOD SUGAR DAILY AND PRN 02/25/16   Marin Olp, MD  sulfamethoxazole-trimethoprim (BACTRIM DS) 800-160 MG tablet Take 1 tablet by mouth 2 (two) times daily. Patient not taking: Reported on 01/05/2021 12/14/20   Marin Olp, MD     Review of Systems  Positive ROS: neg  All other systems have been reviewed and were otherwise negative with the exception of  those mentioned in the HPI and as above.  Objective: Vital signs in last 24 hours: Temp:  [97.4 F (36.3 C)] 97.4 F (36.3 C) (11/28 0604) Pulse Rate:  [72] 72 (11/28 0604) Resp:  [17] 17 (11/28 0604) BP: (116)/(78) 116/78 (11/28 0604) SpO2:  [96 %] 96 % (11/28 0604) Weight:  [98.4 kg] 98.4 kg (11/28 0604)  General Appearance: Alert, cooperative, no distress, appears stated age Head: Normocephalic, without obvious abnormality, atraumatic Eyes: PERRL, conjunctiva/corneas clear, EOM's  intact      Neck: Supple, symmetrical, trachea midline, Back: Symmetric, no curvature, ROM normal, no CVA tenderness Lungs:  respirations unlabored Heart: Regular rate and rhythm Abdomen: Soft, non-tender Extremities: Extremities normal, atraumatic, no cyanosis or edema Pulses: 2+ and symmetric all extremities Skin: Skin color, texture, turgor normal, no rashes or lesions  NEUROLOGIC:  Mental status: Alert and oriented x4, no aphasia, good attention span, fund of knowledge and memory  Motor Exam - grossly normal Sensory Exam - grossly normal Reflexes: 1+ Coordination - grossly normal Gait - grossly normal Balance - grossly normal Cranial Nerves: I: smell Not tested  II: visual acuity  OS: nl    OD: nl  II: visual fields Full to confrontation  II: pupils Equal, round, reactive to light  III,VII: ptosis None  III,IV,VI: extraocular muscles  Full ROM  V: mastication Normal  V: facial light touch sensation  Normal  V,VII: corneal reflex  Present  VII: facial muscle function - upper  Normal  VII: facial muscle function - lower Normal  VIII: hearing Not tested  IX: soft palate elevation  Normal  IX,X: gag reflex Present  XI: trapezius strength  5/5  XI: sternocleidomastoid strength 5/5  XI: neck flexion strength  5/5  XII: tongue strength  Normal    Data Review Lab Results  Component Value Date   WBC 5.5 01/11/2021   HGB 13.6 01/11/2021   HCT 39.8 01/11/2021   MCV 98.3 01/11/2021    PLT 188 01/11/2021   Lab Results  Component Value Date   NA 139 01/11/2021   K 3.9 01/11/2021   CL 107 01/11/2021   CO2 23 01/11/2021   BUN 13 01/11/2021   CREATININE 0.90 01/11/2021   GLUCOSE 79 01/11/2021   Lab Results  Component Value Date   INR 1.1 01/11/2021    Assessment:   Cervical neck pain with herniated nucleus pulposus/ spondylosis/ stenosis at C5-6 c6-7. Estimated body mass index is 35.02 kg/m as calculated from the following:   Height as of this encounter: '5\' 6"'  (1.676 m).   Weight as of this encounter: 98.4 kg.  Patient has failed conservative therapy. Planned surgery : ACDF with plate C5-6 E9-3  Plan:   I explained the condition and procedure to the patient and answered any questions.  Patient wishes to proceed with procedure as planned. Understands risks/ benefits/ and expected or typical outcomes.  [name] [name] 01/18/2021 6:50 AM

## 2021-01-18 NOTE — Anesthesia Procedure Notes (Addendum)
Procedure Name: Intubation Date/Time: 01/18/2021 7:39 AM Performed by: Dorthea Cove, CRNA Pre-anesthesia Checklist: Patient identified, Emergency Drugs available, Suction available and Patient being monitored Patient Re-evaluated:Patient Re-evaluated prior to induction Oxygen Delivery Method: Circle system utilized Preoxygenation: Pre-oxygenation with 100% oxygen Induction Type: IV induction Ventilation: Mask ventilation without difficulty Laryngoscope Size: Glidescope and 3 Grade View: Grade I Tube type: Oral Tube size: 7.0 mm Number of attempts: 1 Airway Equipment and Method: Stylet and Oral airway Placement Confirmation: ETT inserted through vocal cords under direct vision, positive ETCO2 and breath sounds checked- equal and bilateral Secured at: 21 cm Tube secured with: Tape Dental Injury: Teeth and Oropharynx as per pre-operative assessment  Comments: Neck positioned by patient for comfort and maintained in this position for intubation.

## 2021-01-18 NOTE — Transfer of Care (Signed)
Immediate Anesthesia Transfer of Care Note  Patient: [name] [name]  Procedure(s) Performed: Cervical five-six Cervical six-seven Anterior Cervical Decompression/ discectomy Fusion (Spine Cervical)  Patient Location: PACU  Anesthesia Type:General  Level of Consciousness: awake, alert  and oriented  Airway & Oxygen Therapy: Patient Spontanous Breathing and Patient connected to nasal cannula oxygen  Post-op Assessment: Report given to RN and Post -op Vital signs reviewed and stable  Post vital signs: Reviewed and stable  Last Vitals:  Vitals Value Taken Time  BP 166/68 01/18/21 1015  Temp    Pulse 84 01/18/21 1020  Resp 14 01/18/21 1020  SpO2 93 % 01/18/21 1020  Vitals shown include unvalidated device data.  Last Pain:  Vitals:   01/18/21 0628  TempSrc:   PainSc: 5       Patients Stated Pain Goal: 0 (33/61/22 [id number])  Complications: No notable events documented.

## 2021-01-18 NOTE — Anesthesia Preprocedure Evaluation (Signed)
Anesthesia Evaluation  Patient identified by MRN, date of birth, ID band Patient awake    Reviewed: Allergy & Precautions, H&P , NPO status , Patient's Chart, lab work & pertinent test results  Airway Mallampati: II   Neck ROM: full    Dental   Pulmonary shortness of breath, sleep apnea , former smoker,    breath sounds clear to auscultation       Cardiovascular hypertension,  Rhythm:regular Rate:Normal     Neuro/Psych  Headaches, Anxiety Depression  Neuromuscular disease    GI/Hepatic GERD  ,  Endo/Other  diabetes, Type 2Hypothyroidism   Renal/GU      Musculoskeletal  (+) Arthritis ,   Abdominal   Peds  Hematology   Anesthesia Other Findings   Reproductive/Obstetrics                             Anesthesia Physical Anesthesia Plan  ASA: 2  Anesthesia Plan: General   Post-op Pain Management:    Induction: Intravenous  PONV Risk Score and Plan: 3 and Ondansetron, Dexamethasone, Midazolam and Treatment may vary due to age or medical condition  Airway Management Planned: Oral ETT and Video Laryngoscope Planned  Additional Equipment:   Intra-op Plan:   Post-operative Plan: Extubation in OR  Informed Consent: I have reviewed the patients History and Physical, chart, labs and discussed the procedure including the risks, benefits and alternatives for the proposed anesthesia with the patient or authorized representative who has indicated his/her understanding and acceptance.     Dental advisory given  Plan Discussed with: CRNA, Anesthesiologist and Surgeon  Anesthesia Plan Comments:         Anesthesia Quick Evaluation

## 2021-01-18 NOTE — Op Note (Signed)
01/18/2021  10:16 AM  PATIENT:  [name] [name]  65 y.o. female  PRE-OPERATIVE DIAGNOSIS: Cervical spondylosis with cervical spinal stenosis C5-6 C6-7 with neck and arm pain  POST-OPERATIVE DIAGNOSIS:  same  PROCEDURE:  1. Decompressive anterior cervical discectomy C5-6 C6-7, 2. Anterior cervical arthrodesis C5-6 C6-7 utilizing a peek interbody cage packed with locally harvested morcellized autologous bone graft, and 1 cc DBM putty 3. Anterior cervical plating C5-7 inclusive utilizing a Alphatec plate  SURGEON:  Sherley Bounds, MD  ASSISTANTS: Glenford Peers FNP  ANESTHESIA:   General  EBL: 75 ml  Total I/O In: 1100 [I.V.:1000; IV Piggyback:100] Out: 75 [Blood:75]  BLOOD ADMINISTERED: none  DRAINS: none  SPECIMEN:  none  INDICATION FOR PROCEDURE: This patient presented with neck and arm pain. Imaging showed cervical spondylosis. The patient tried conservative measures without relief. Pain was debilitating. Recommended ACDF with plating. Patient understood the risks, benefits, and alternatives and potential outcomes and wished to proceed.  PROCEDURE DETAILS: Patient was brought to the operating room placed under general endotracheal anesthesia. Patient was placed in the supine position on the operating room table. The neck was prepped with Duraprep and draped in a sterile fashion.   Three cc of local anesthesia was injected and a transverse incision was made on the right side of the neck.  Dissection was carried down thru the subcutaneous tissue and the platysma was  elevated, opened, and undermined with Metzenbaum scissors.  Dissection was then carried out thru an avascular plane leaving the sternocleidomastoid carotid artery and jugular vein laterally and the trachea and esophagus medially with the assistance of my nurse practitioner. The ventral aspect of the vertebral column was identified and a localizing x-ray was taken. The C5-6 level was identified and all in the room agreed with  the level. The longus colli muscles were then elevated and the retractor was placed with the assistance of my nurse practitioner to expose C5-6 and C6-7. The anterior osteophytes at C5-6 and C6-7 were removed and the annulus was incised and the disc space entered. Discectomy was performed with micro-curettes and pituitary rongeurs. I then used the high-speed drill to drill the endplates down to the level of the posterior longitudinal ligament. The drill shavings from both levels were saved in a mucous trap for later arthrodesis. The operating microscope was draped and brought into the field provided additional magnification, illumination and visualization. Discectomy was continued posteriorly thru the disc space. Posterior longitudinal ligament was opened with a nerve hook, and then removed along with disc herniation and osteophytes, decompressing the spinal canal and thecal sac. We then continued to remove osteophytic overgrowth and disc material decompressing the neural foramina and exiting nerve roots bilaterally. The scope was angled up and down to help decompress and undercut the vertebral bodies. Once the decompression was completed we could pass a nerve hook circumferentially to assure adequate decompression in the midline and in the neural foramina. So by both visualization and palpation we felt we had an adequate decompression of the neural elements. We then measured the height of the intravertebral disc space and selected a 7 millimeter peek interbody cage packed with autograft and DBM putty. It was then gently positioned in the intravertebral disc spaces of C5-6 and C6-7 and countersunk. I then used a Alphatec plate and placed 14 mm variable angle screws into the vertebral bodies of each level and locked them into position. The wound was irrigated with bacitracin solution, checked for hemostasis which was established and confirmed. Once meticulous hemostasis  was achieved, we then proceeded with closure  with the assistance of my nurse practitioner. The platysma was closed with interrupted 3-0 undyed Vicryl suture, the subcuticular layer was closed with interrupted 3-0 undyed Vicryl suture. The skin edges were approximated with steristrips. The drapes were removed. A sterile dressing was applied. The patient was then awakened from general anesthesia and transferred to the recovery room in stable condition. At the end of the procedure all sponge, needle and instrument counts were correct.   PLAN OF CARE: Admit for overnight observation  PATIENT DISPOSITION:  PACU - hemodynamically stable.   Delay start of Pharmacological VTE agent (>24hrs) due to surgical blood loss or risk of bleeding:  yes

## 2021-01-19 ENCOUNTER — Encounter (HOSPITAL_COMMUNITY): Payer: Self-pay | Admitting: Neurological Surgery

## 2021-01-19 DIAGNOSIS — M4802 Spinal stenosis, cervical region: Secondary | ICD-10-CM | POA: Diagnosis not present

## 2021-01-19 LAB — GLUCOSE, CAPILLARY: Glucose-Capillary: 152 mg/dL — ABNORMAL HIGH (ref 70–99)

## 2021-01-19 MED ORDER — HYDROCODONE-ACETAMINOPHEN 7.5-325 MG PO TABS: 1.0000 | ORAL_TABLET | ORAL | 0 refills | Status: DC | PRN

## 2021-01-19 NOTE — Plan of Care (Signed)

## 2021-01-19 NOTE — Progress Notes (Signed)
Patient awaiting transport via wheelchair by volunteer for discharge home; in no acute distress nor complaints of pain nor discomfort; incision on her anterior neck with steri strips and is clean, dry and intact; room was checked and accounted for all her belongings; discharge instructions concerning her medications, incision care, follow up appointment and when to call the doctor as needed were all discussed with patient by RN and she expressed understanding on the instructions given.

## 2021-01-19 NOTE — Discharge Instructions (Signed)
Wound Care  Keep the incision clean and dry remove the outer dressing in 2 days, leave the Steri-Strips intact.  Do not put any creams, lotions, or ointments on incision. Leave steri-strips on neck.  They will fall off by themselves.  Activity Walk each and every day, increasing distance each day. No lifting greater than 5 lbs.  Avoid excessive neck motion. No lifting no bending no twisting no driving or riding a car unless coming back and forth to see me. Wear neck brace at all times except when showering.   Diet Resume your normal diet.   Return to Work Will be discussed at you follow up appointment.  Call Your Doctor If Any of These Occur Redness, drainage, or swelling at the wound.  Temperature greater than 101 degrees. Severe pain not relieved by pain medication. Incision starts to come apart.  Follow Up Appt Call today for appointment in 1-2 weeks ([phone]) or for problems.  If you have any hardware placed in your spine, you will need an x-ray before your appointment.

## 2021-01-19 NOTE — Discharge Summary (Signed)
Physician Discharge Summary  Patient ID: [name] [name] MRN: [id number] DOB/AGE: 08/27/1955 65 y.o.  Admit date: 01/18/2021 Discharge date: 01/19/2021  Admission Diagnoses: Cervical spondylosis with cervical spinal stenosis C5-6 C6-7 with neck and arm pain     Discharge Diagnoses: same   Discharged Condition: good  Hospital Course: The patient was admitted on 01/18/2021 and taken to the operating room where the patient underwent acdf C5-6, C6-7. The patient tolerated the procedure well and was taken to the recovery room and then to the floor in stable condition. The hospital course was routine. There were no complications. The wound remained clean dry and intact. Pt had appropriate neck soreness. No complaints of arm pain or new N/T/W. The patient remained afebrile with stable vital signs, and tolerated a regular diet. The patient continued to increase activities, and pain was well controlled with oral pain medications.   Consults: None  Significant Diagnostic Studies:  Results for orders placed or performed during the hospital encounter of 01/18/21  SARS Coronavirus 2 by RT PCR (hospital order, performed in [place] hospital lab) Nasopharyngeal Nasopharyngeal Swab   Specimen: Nasopharyngeal Swab  Result Value Ref Range   SARS Coronavirus 2 NEGATIVE NEGATIVE  Glucose, capillary  Result Value Ref Range   Glucose-Capillary 95 70 - 99 mg/dL  Glucose, capillary  Result Value Ref Range   Glucose-Capillary 139 (H) 70 - 99 mg/dL  Glucose, capillary  Result Value Ref Range   Glucose-Capillary 163 (H) 70 - 99 mg/dL  Glucose, capillary  Result Value Ref Range   Glucose-Capillary 178 (H) 70 - 99 mg/dL  Glucose, capillary  Result Value Ref Range   Glucose-Capillary 261 (H) 70 - 99 mg/dL   Comment 1 Notify RN    Comment 2 Document in Chart   Glucose, capillary  Result Value Ref Range   Glucose-Capillary 152 (H) 70 - 99 mg/dL   Comment 1 Notify RN    Comment 2 Document in Chart      DG Cervical Spine 1 View  Result Date: 01/18/2021 CLINICAL DATA:  Cervical ACDF EXAM: DG CERVICAL SPINE - 1 VIEW COMPARISON:  12/09/2020 FINDINGS: Serial images show performance of anterior cervical discectomy and fusion from C5-C7. There is discectomy with interbody spacers. Anterior plate with screw fixation. IMPRESSION: ACDF C5 through C7.  No unexpected finding. Electronically Signed   By: Nelson Chimes M.D.   On: 01/18/2021 09:59   DG C-Arm 1-60 Min-No Report  Result Date: 01/18/2021 Fluoroscopy was utilized by the requesting physician.  No radiographic interpretation.   DG C-Arm 1-60 Min-No Report  Result Date: 01/18/2021 Fluoroscopy was utilized by the requesting physician.  No radiographic interpretation.    Antibiotics:  Anti-infectives (From admission, onward)    Start     Dose/Rate Route Frequency Ordered Stop   01/18/21 1600  ceFAZolin (ANCEF) IVPB 2g/100 mL premix        2 g 200 mL/hr over 30 Minutes Intravenous Every 8 hours 01/18/21 1057 01/18/21 2347   01/18/21 0600  ceFAZolin (ANCEF) IVPB 2g/100 mL premix        2 g 200 mL/hr over 30 Minutes Intravenous On call to O.R. 01/18/21 0551 01/18/21 0757       Discharge Exam: Blood pressure (!) 167/75, pulse 77, temperature 98.6 F (37 C), temperature source Oral, resp. rate 18, height '5\' 6"'  (1.676 m), weight 98.4 kg, SpO2 94 %. Neurologic: Grossly normal Ambulating and voiding well, incision cdi   Discharge Medications:   Allergies as of 01/19/2021  Reactions   Adhesive [tape] Other (See Comments)   blisters   Nitrofurantoin    Diffuse rash   Other Other (See Comments)   (Neoprene)--causes blisters.         Medication List     STOP taking these medications    sulfamethoxazole-trimethoprim 800-160 MG tablet Commonly known as: BACTRIM DS       TAKE these medications    aspirin EC 81 MG tablet Take 81 mg by mouth daily. Swallow whole.   benazepril 10 MG tablet Commonly known as:  LOTENSIN Take 1 tablet (10 mg total) by mouth daily.   blood glucose meter kit and supplies Kit Dispense based on patient and insurance preference. Use up to four times daily as directed. DX E11.9   buPROPion 75 MG tablet Commonly known as: WELLBUTRIN TAKE 1/2 TABLET BY MOUTH TWO TIMES A DAY   CRANBERRY PO Take 1 tablet by mouth daily.   escitalopram 20 MG tablet Commonly known as: LEXAPRO Take 1 tablet (20 mg total) by mouth daily.   glucose blood test strip Commonly known as: OneTouch Verio USE TO CHECK BLOOD SUGAR DAILY AND PRN   HYDROcodone-acetaminophen 7.5-325 MG tablet Commonly known as: NORCO Take 1 tablet by mouth every 4 (four) hours as needed for moderate pain ((score 4 to 6)).   levothyroxine 75 MCG tablet Commonly known as: SYNTHROID Take 1 tablet (75 mcg total) by mouth daily. What changed: when to take this   metFORMIN 500 MG 24 hr tablet Commonly known as: Glucophage XR Take 2 tablets (1,000 mg total) by mouth in the morning and at bedtime.   methocarbamol 500 MG tablet Commonly known as: ROBAXIN Take 500 mg by mouth every 8 (eight) hours as needed for muscle spasms.   Moderna COVID-19 Bival Booster 50 MCG/0.5ML injection Generic drug: COVID-19 mRNA bivalent vaccine (Moderna) Inject into the muscle.   omeprazole 40 MG capsule Commonly known as: PRILOSEC Take 1 capsule (40 mg total) by mouth daily.   simvastatin 20 MG tablet Commonly known as: ZOCOR Take 1 tablet (20 mg total) by mouth at bedtime. TAKE 1 TABLET BY MOUTH EVERYDAY AT BEDTIME   tiZANidine 4 MG capsule Commonly known as: ZANAFLEX Take 1 capsule (4 mg total) by mouth 3 (three) times daily as needed for muscle spasms. What changed: when to take this   traZODone 50 MG tablet Commonly known as: DESYREL Take 1 tablet (50 mg total) by mouth at bedtime.   Vitamin D High Potency 25 MCG (1000 UT) capsule Generic drug: Cholecalciferol Take 1,000 Units by mouth daily.         Disposition: home   Final Dx: acdf C5-6, C6-7  Discharge Instructions     Call MD for:  difficulty breathing, headache or visual disturbances   Complete by: As directed    Call MD for:  persistant nausea and vomiting   Complete by: As directed    Call MD for:  redness, tenderness, or signs of infection (pain, swelling, redness, odor or green/yellow discharge around incision site)   Complete by: As directed    Call MD for:  severe uncontrolled pain   Complete by: As directed    Call MD for:  temperature >100.4   Complete by: As directed    Diet - low sodium heart healthy   Complete by: As directed    Increase activity slowly   Complete by: As directed    No wound care   Complete by: As directed  Signed: Ocie Cornfield Finlee Concepcion 01/19/2021, 8:33 AM

## 2021-01-19 NOTE — Progress Notes (Signed)
Preop labs

## 2021-01-19 NOTE — Anesthesia Postprocedure Evaluation (Signed)
Anesthesia Post Note  Patient: [name] [name]  Procedure(s) Performed: Cervical five-six Cervical six-seven Anterior Cervical Decompression/ discectomy Fusion (Spine Cervical)     Patient location during evaluation: PACU Anesthesia Type: General Level of consciousness: awake and alert Pain management: pain level controlled Vital Signs Assessment: post-procedure vital signs reviewed and stable Respiratory status: spontaneous breathing, nonlabored ventilation, respiratory function stable and patient connected to nasal cannula oxygen Cardiovascular status: blood pressure returned to baseline and stable Postop Assessment: no apparent nausea or vomiting Anesthetic complications: no   No notable events documented.  Last Vitals:  Vitals:   01/18/21 2304 01/19/21 0314  BP: (!) 149/80 (!) 156/69  Pulse: 76 72  Resp: 18 18  Temp: 37.2 C 37.1 C  SpO2: 96% 93%    Last Pain:  Vitals:   01/19/21 0400  TempSrc:   PainSc: 2    Pain Goal: Patients Stated Pain Goal: 0 (01/18/21 1700)                 Lynnell Fiumara S

## 2021-02-05 ENCOUNTER — Other Ambulatory Visit: Payer: Self-pay | Admitting: Family Medicine

## 2021-02-08 ENCOUNTER — Other Ambulatory Visit: Payer: Self-pay | Admitting: Family Medicine

## 2021-02-09 LAB — HM DIABETES EYE EXAM

## 2021-03-01 ENCOUNTER — Telehealth (INDEPENDENT_AMBULATORY_CARE_PROVIDER_SITE_OTHER): Payer: Medicare Other | Admitting: Primary Care

## 2021-03-01 ENCOUNTER — Other Ambulatory Visit: Payer: Self-pay

## 2021-03-01 ENCOUNTER — Telehealth: Payer: Medicare Other | Admitting: Acute Care

## 2021-03-01 ENCOUNTER — Encounter: Payer: Self-pay | Admitting: Primary Care

## 2021-03-01 DIAGNOSIS — Z87891 Personal history of nicotine dependence: Secondary | ICD-10-CM

## 2021-03-01 NOTE — Progress Notes (Signed)
Virtual Visit via Telephone Note  I connected with [name] on 03/01/21 at  2:30 PM EST by telephone and verified that I am speaking with the correct person using two identifiers.  Location: Patient: Home Provider: Office    Shared Decision Making Visit Lung Cancer Screening Program [phone])   Eligibility: Age 66 y.o. Pack Years Smoking History Calculation 30 (# packs/per year x # years smoked) Recent History of coughing up blood  no Unexplained weight loss? no ( >Than 15 pounds within the last 6 months ) Prior History Lung / other cancer no (Diagnosis within the last 5 years already requiring surveillance chest CT Scans). Smoking Status Former Smoker Former Smokers: Years since quit: 5 years  Quit Date: 2017  Visit Components: Discussion included one or more decision making aids. yes Discussion included risk/benefits of screening. yes Discussion included potential follow up diagnostic testing for abnormal scans. yes Discussion included meaning and risk of over diagnosis. yes Discussion included meaning and risk of False Positives. yes Discussion included meaning of total radiation exposure. yes  Counseling Included: Importance of adherence to annual lung cancer LDCT screening. yes Impact of comorbidities on ability to participate in the program. yes Ability and willingness to under diagnostic treatment. yes  Smoking Cessation Counseling: Current Smokers:  Discussed importance of smoking cessation. yes Information about tobacco cessation classes and interventions provided to patient. yes Patient provided with "ticket" for LDCT Scan. NA Symptomatic Patient. no  Counseling(Intermediate counseling: > three minutes) 99406 Diagnosis Code: Tobacco Use Z72.0 Asymptomatic Patient yes  Counseling (Intermediate counseling: > three minutes counseling) J8119 Former Smokers:  Discussed the importance of maintaining cigarette abstinence. yes Diagnosis Code: Personal  History of Nicotine Dependence. J47.829 Information about tobacco cessation classes and interventions provided to patient. Yes Patient provided with "ticket" for LDCT Scan. NA Written Order for Lung Cancer Screening with LDCT placed in Epic. Yes (CT Chest Lung Cancer Screening Low Dose W/O CM) [id number] Z12.2-Screening of respiratory organs Z87.891-Personal history of nicotine dependence  I have spent 25 minutes of face to face/ virtual visit   time with Ms Fong discussing the risks and benefits of lung cancer screening. We viewed / discussed a power point together that explained in detail the above noted topics. We paused at intervals to allow for questions to be asked and answered to ensure understanding.We discussed that the single most powerful action that she can take to decrease her risk of developing lung cancer is to quit smoking. We discussed whether or not she is ready to commit to setting a quit date. We discussed options for tools to aid in quitting smoking including nicotine replacement therapy, non-nicotine medications, support groups, Quit Smart classes, and behavior modification. We discussed that often times setting smaller, more achievable goals, such as eliminating 1 cigarette a day for a week and then 2 cigarettes a day for a week can be helpful in slowly decreasing the number of cigarettes smoked. This allows for a sense of accomplishment as well as providing a clinical benefit. I provided  her  with smoking cessation  information  with contact information for community resources, classes, free nicotine replacement therapy, and access to mobile apps, text messaging, and on-line smoking cessation help. I have also provided  her  the office contact information in the event she needs to contact me, or the screening staff. We discussed the time and location of the scan, and that either Doroteo Glassman RN, Joella Prince, RN  or I will call / send a  letter with the results within 24-72 hours of  receiving them. The patient verbalized understanding of all of  the above and had no further questions upon leaving the office. They have my contact information in the event they have any further questions.  I spent 3 minutes counseling on smoking cessation and the health risks of continued tobacco abuse.  I explained to the patient that there has been a high incidence of coronary artery disease noted on these exams. I explained that this is a non-gated exam therefore degree or severity cannot be determined. This patient is on statin therapy. I have asked the patient to follow-up with their PCP regarding any incidental finding of coronary artery disease and management with diet or medication as their PCP  feels is clinically indicated. The patient verbalized understanding of the above and had no further questions upon completion of the visit.    Martyn Ehrich, NP

## 2021-03-01 NOTE — Patient Instructions (Signed)
Thank you for participating in the [facility] Screening [facility]. °It was our pleasure to meet you today. °We will call you with the results of your scan within the next few days. °Your scan will be assigned a Lung RADS category score by the physicians reading the scans.  °This Lung RADS score determines follow up scanning.  °See below for description of categories, and follow up screening recommendations. °We will be in touch to schedule your follow up screening annually or based on recommendations of our providers. °We will fax a copy of your scan results to your Primary Care Physician, or the physician who referred you to the program, to ensure they have the results. °Please call the office if you have any questions or concerns regarding your scanning experience or results.  °Our office number is [phone]. °Please speak with Denise Phelps, RN. She is our Lung Cancer Screening RN. °If she is unavailable when you call, please have the office staff send her a message. She will return your call at her earliest convenience. °Remember, if your scan is normal, we will scan you annually as long as you continue to meet the criteria for the program. (Age 55-77, Current smoker or smoker who has quit within the last 15 years). °If you are a smoker, remember, quitting is the single most powerful action that you can take to decrease your risk of lung cancer and other pulmonary, breathing related problems. °We know quitting is hard, and we are here to help.  °Please let us know if there is anything we can do to help you meet your goal of quitting. °If you are a former smoker, congratulations. We are proud of you! Remain smoke free! °Remember you can refer friends or family members through the number above.  °We will screen them to make sure they meet criteria for the program. °Thank you for helping us take better care of you by participating in Lung Screening. ° °You can receive free nicotine replacement therapy  ( patches, gum or mints) by calling 1-800-QUIT NOW. Please call so we can get you on the path to becoming  a non-smoker. I know it is hard, but you can do this! ° °Lung RADS Categories: ° °Lung RADS 1: no nodules or definitely non-concerning nodules.  °Recommendation is for a repeat annual scan in 12 months. ° °Lung RADS 2:  nodules that are non-concerning in appearance and behavior with a very low likelihood of becoming an active cancer. °Recommendation is for a repeat annual scan in 12 months. ° °Lung RADS 3: nodules that are probably non-concerning , includes nodules with a low likelihood of becoming an active cancer.  Recommendation is for a 6-month repeat screening scan. Often noted after an upper respiratory illness. We will be in touch to make sure you have no questions, and to schedule your 6-month scan. ° °Lung RADS 4 A: nodules with concerning findings, recommendation is most often for a follow up scan in 3 months or additional testing based on our provider's assessment of the scan. We will be in touch to make sure you have no questions and to schedule the recommended 3 month follow up scan. ° °Lung RADS 4 B:  indicates findings that are concerning. We will be in touch with you to schedule additional diagnostic testing based on our provider's  assessment of the scan. ° °Hypnosis for smoking cessation  °[employer] °[phone] ° °Acupuncture for smoking cessation  °[facility] °[phone]  °

## 2021-03-02 ENCOUNTER — Other Ambulatory Visit: Payer: Self-pay

## 2021-03-02 ENCOUNTER — Ambulatory Visit
Admission: RE | Admit: 2021-03-02 | Discharge: 2021-03-02 | Disposition: A | Discharge status: Home / Self Care | Payer: Medicare Other | Source: Ambulatory Visit | Attending: Acute Care | Admitting: Acute Care

## 2021-03-02 DIAGNOSIS — Z87891 Personal history of nicotine dependence: Secondary | ICD-10-CM

## 2021-03-04 ENCOUNTER — Other Ambulatory Visit: Payer: Self-pay | Admitting: Acute Care

## 2021-03-04 DIAGNOSIS — Z87891 Personal history of nicotine dependence: Secondary | ICD-10-CM

## 2021-03-08 ENCOUNTER — Other Ambulatory Visit: Payer: Self-pay | Admitting: Family Medicine

## 2021-04-01 ENCOUNTER — Telehealth: Payer: Self-pay | Admitting: Family Medicine

## 2021-04-01 DIAGNOSIS — E039 Hypothyroidism, unspecified: Secondary | ICD-10-CM

## 2021-04-01 NOTE — Telephone Encounter (Signed)
Copied from [place] [id number]. Topic: Medicare AWV >> Apr 01, 2021 11:29 AM Harris-Coley, Hannah Beat wrote: Reason for CRM: Left message for patient to schedule Annual Wellness Visit.  Please schedule with Nurse Health Advisor Charlott Rakes, RN at [facility].  Please call [phone] ask for [facility]

## 2021-04-01 NOTE — Telephone Encounter (Signed)
Pt scheduled a follow up with Dr Yong Channel on 06/29/21. She states she will be out of all of her medications by 04/20/21. She will need them ordered at the pharmacy listed below.  PHARMACY: [facility] [address], [place] - [address] Phone:  [phone]  Fax:  [phone]

## 2021-04-02 MED ORDER — SIMVASTATIN 20 MG PO TABS: 20.0000 mg | ORAL_TABLET | Freq: Every day | ORAL | 3 refills | Status: DC

## 2021-04-02 MED ORDER — TRAZODONE HCL 50 MG PO TABS: 50.0000 mg | ORAL_TABLET | Freq: Every day | ORAL | 3 refills | Status: DC

## 2021-04-02 MED ORDER — BUPROPION HCL 75 MG PO TABS: 37.5000 mg | ORAL_TABLET | Freq: Two times a day (BID) | ORAL | 3 refills | Status: DC

## 2021-04-02 MED ORDER — BENAZEPRIL HCL 10 MG PO TABS: 10.0000 mg | ORAL_TABLET | Freq: Every day | ORAL | 3 refills | Status: DC

## 2021-04-02 MED ORDER — TIZANIDINE HCL 4 MG PO CAPS: 4.0000 mg | ORAL_CAPSULE | Freq: Three times a day (TID) | ORAL | 0 refills | Status: DC | PRN

## 2021-04-02 MED ORDER — ESCITALOPRAM OXALATE 20 MG PO TABS: 20.0000 mg | ORAL_TABLET | Freq: Every day | ORAL | 3 refills | Status: DC

## 2021-04-02 MED ORDER — LEVOTHYROXINE SODIUM 75 MCG PO TABS: 75.0000 ug | ORAL_TABLET | Freq: Every day | ORAL | 3 refills | Status: DC

## 2021-04-02 MED ORDER — METFORMIN HCL ER 500 MG PO TB24: 1000.0000 mg | ORAL_TABLET | Freq: Two times a day (BID) | ORAL | 3 refills | Status: DC

## 2021-04-02 MED ORDER — OMEPRAZOLE 40 MG PO CPDR: 40.0000 mg | DELAYED_RELEASE_CAPSULE | Freq: Every day | ORAL | 3 refills | Status: DC

## 2021-04-02 NOTE — Telephone Encounter (Signed)
Called and lm on pt vm tcb and mychart message sent also.

## 2021-04-02 NOTE — Telephone Encounter (Signed)
Called and spoke with pt and medications sent to pharmacy.

## 2021-04-07 ENCOUNTER — Other Ambulatory Visit: Payer: Self-pay | Admitting: Family Medicine

## 2021-04-14 ENCOUNTER — Telehealth: Payer: Self-pay | Admitting: Family Medicine

## 2021-04-14 NOTE — Chronic Care Management (AMB) (Signed)
°  Chronic Care Management   Outreach Note  04/14/2021 Name: [name] [name] MRN: [id number] DOB: 04/05/1955  Referred by: Marin Olp, MD Reason for referral : No chief complaint on file.   An unsuccessful telephone outreach was attempted today. The patient was referred to the pharmacist for assistance with care management and care coordination.   Follow Up Plan:   Tatjana Dellinger Upstream Scheduler

## 2021-04-19 ENCOUNTER — Ambulatory Visit: Payer: Medicare Other

## 2021-04-20 ENCOUNTER — Telehealth: Payer: Self-pay | Admitting: Family Medicine

## 2021-04-20 NOTE — Chronic Care Management (AMB) (Signed)
°  Chronic Care Management   Outreach Note  04/20/2021 Name: Xochitl Egle MRN: [id number] DOB: 10/03/1955  Referred by: Marin Olp, MD Reason for referral : No chief complaint on file.   A second unsuccessful telephone outreach was attempted today. The patient was referred to pharmacist for assistance with care management and care coordination.  Follow Up Plan:   Tatjana Dellinger Upstream Scheduler

## 2021-04-23 ENCOUNTER — Other Ambulatory Visit: Payer: Self-pay

## 2021-04-23 ENCOUNTER — Ambulatory Visit (INDEPENDENT_AMBULATORY_CARE_PROVIDER_SITE_OTHER): Payer: Medicare Other

## 2021-04-23 DIAGNOSIS — Z0000 Encounter for general adult medical examination without abnormal findings: Secondary | ICD-10-CM

## 2021-04-23 NOTE — Patient Instructions (Signed)
[name] [name] , Thank you for taking time to come for your Medicare Wellness Visit. I appreciate your ongoing commitment to your health goals. Please review the following plan we discussed and let me know if I can assist you in the future.   Screening recommendations/referrals: Colonoscopy: Done 01/10/18 repeat every 5 years  Mammogram: Done 07/29/20 repeat every year  Bone Density: Done 12/11/19 repeat every 2 years  Recommended yearly ophthalmology/optometry visit for glaucoma screening and checkup Recommended yearly dental visit for hygiene and checkup  Vaccinations: Influenza vaccine: Done 12/14/20 repeat every year  Pneumococcal vaccine: Up to date Tdap vaccine: Done 06/06/12 repeat every 10 years  Shingles vaccine: Completed 02/08/19, 05/30/19   Covid-19:Completed 2/24, 3/24, 12/30/19 & 4/5, 01/06/21 Advanced directives: Please bring a copy of your health care power of attorney and living will to the office at your convenience.   Conditions/risks identified: lose weight   Next appointment: Follow up in one year for your annual wellness visit    Preventive Care 65 Years and Older, Female Preventive care refers to lifestyle choices and visits with your health care provider that can promote health and wellness. What does preventive care include? A yearly physical exam. This is also called an annual well check. Dental exams once or twice a year. Routine eye exams. Ask your health care provider how often you should have your eyes checked. Personal lifestyle choices, including: Daily care of your teeth and gums. Regular physical activity. Eating a healthy diet. Avoiding tobacco and drug use. Limiting alcohol use. Practicing safe sex. Taking low-dose aspirin every day. Taking vitamin and mineral supplements as recommended by your health care provider. What happens during an annual well check? The services and screenings done by your health care provider during your annual well check  will depend on your age, overall health, lifestyle risk factors, and family history of disease. Counseling  Your health care provider may ask you questions about your: Alcohol use. Tobacco use. Drug use. Emotional well-being. Home and relationship well-being. Sexual activity. Eating habits. History of falls. Memory and ability to understand (cognition). Work and work Statistician. Reproductive health. Screening  You may have the following tests or measurements: Height, weight, and BMI. Blood pressure. Lipid and cholesterol levels. These may be checked every 5 years, or more frequently if you are over 51 years old. Skin check. Lung cancer screening. You may have this screening every year starting at age 74 if you have a 30-pack-year history of smoking and currently smoke or have quit within the past 15 years. Fecal occult blood test (FOBT) of the stool. You may have this test every year starting at age 42. Flexible sigmoidoscopy or colonoscopy. You may have a sigmoidoscopy every 5 years or a colonoscopy every 10 years starting at age 93. Hepatitis C blood test. Hepatitis B blood test. Sexually transmitted disease (STD) testing. Diabetes screening. This is done by checking your blood sugar (glucose) after you have not eaten for a while (fasting). You may have this done every 1-3 years. Bone density scan. This is done to screen for osteoporosis. You may have this done starting at age 46. Mammogram. This may be done every 1-2 years. Talk to your health care provider about how often you should have regular mammograms. Talk with your health care provider about your test results, treatment options, and if necessary, the need for more tests. Vaccines  Your health care provider may recommend certain vaccines, such as: Influenza vaccine. This is recommended every year. Tetanus, diphtheria,  and acellular pertussis (Tdap, Td) vaccine. You may need a Td booster every 10 years. Zoster vaccine. You  may need this after age 52. Pneumococcal 13-valent conjugate (PCV13) vaccine. One dose is recommended after age 58. Pneumococcal polysaccharide (PPSV23) vaccine. One dose is recommended after age 54. Talk to your health care provider about which screenings and vaccines you need and how often you need them. This information is not intended to replace advice given to you by your health care provider. Make sure you discuss any questions you have with your health care provider. Document Released: 03/06/2015 Document Revised: 10/28/2015 Document Reviewed: 12/09/2014 Elsevier Interactive Patient Education  2017 [place] Prevention in the Home Falls can cause injuries. They can happen to people of all ages. There are many things you can do to make your home safe and to help prevent falls. What can I do on the outside of my home? Regularly fix the edges of walkways and driveways and fix any cracks. Remove anything that might make you trip as you walk through a door, such as a raised step or threshold. Trim any bushes or trees on the path to your home. Use bright outdoor lighting. Clear any walking paths of anything that might make someone trip, such as rocks or tools. Regularly check to see if handrails are loose or broken. Make sure that both sides of any steps have handrails. Any raised decks and porches should have guardrails on the edges. Have any leaves, snow, or ice cleared regularly. Use sand or salt on walking paths during winter. Clean up any spills in your garage right away. This includes oil or grease spills. What can I do in the bathroom? Use night lights. Install grab bars by the toilet and in the tub and shower. Do not use towel bars as grab bars. Use non-skid mats or decals in the tub or shower. If you need to sit down in the shower, use a plastic, non-slip stool. Keep the floor dry. Clean up any water that spills on the floor as soon as it happens. Remove soap buildup  in the tub or shower regularly. Attach bath mats securely with double-sided non-slip rug tape. Do not have throw rugs and other things on the floor that can make you trip. What can I do in the bedroom? Use night lights. Make sure that you have a light by your bed that is easy to reach. Do not use any sheets or blankets that are too big for your bed. They should not hang down onto the floor. Have a firm chair that has side arms. You can use this for support while you get dressed. Do not have throw rugs and other things on the floor that can make you trip. What can I do in the kitchen? Clean up any spills right away. Avoid walking on wet floors. Keep items that you use a lot in easy-to-reach places. If you need to reach something above you, use a strong step stool that has a grab bar. Keep electrical cords out of the way. Do not use floor polish or wax that makes floors slippery. If you must use wax, use non-skid floor wax. Do not have throw rugs and other things on the floor that can make you trip. What can I do with my stairs? Do not leave any items on the stairs. Make sure that there are handrails on both sides of the stairs and use them. Fix handrails that are broken or loose. Make sure  that handrails are as long as the stairways. Check any carpeting to make sure that it is firmly attached to the stairs. Fix any carpet that is loose or worn. Avoid having throw rugs at the top or bottom of the stairs. If you do have throw rugs, attach them to the floor with carpet tape. Make sure that you have a light switch at the top of the stairs and the bottom of the stairs. If you do not have them, ask someone to add them for you. What else can I do to help prevent falls? Wear shoes that: Do not have high heels. Have rubber bottoms. Are comfortable and fit you well. Are closed at the toe. Do not wear sandals. If you use a stepladder: Make sure that it is fully opened. Do not climb a closed  stepladder. Make sure that both sides of the stepladder are locked into place. Ask someone to hold it for you, if possible. Clearly mark and make sure that you can see: Any grab bars or handrails. First and last steps. Where the edge of each step is. Use tools that help you move around (mobility aids) if they are needed. These include: Canes. Walkers. Scooters. Crutches. Turn on the lights when you go into a dark area. Replace any light bulbs as soon as they burn out. Set up your furniture so you have a clear path. Avoid moving your furniture around. If any of your floors are uneven, fix them. If there are any pets around you, be aware of where they are. Review your medicines with your doctor. Some medicines can make you feel dizzy. This can increase your chance of falling. Ask your doctor what other things that you can do to help prevent falls. This information is not intended to replace advice given to you by your health care provider. Make sure you discuss any questions you have with your health care provider. Document Released: 12/04/2008 Document Revised: 07/16/2015 Document Reviewed: 03/14/2014 Elsevier Interactive Patient Education  2017 [employer].

## 2021-04-23 NOTE — Progress Notes (Signed)
Virtual Visit via Telephone Note  I connected with  [name] [name] on 04/23/21 at  3:15 PM EST by telephone and verified that I am speaking with the correct person using two identifiers.  Medicare Annual Wellness visit completed telephonically due to Covid-19 pandemic.   Persons participating in this call: This Health Coach and this patient.   Location: Patient: Home Provider: Office    I discussed the limitations, risks, security and privacy concerns of performing an evaluation and management service by telephone and the availability of in person appointments. The patient expressed understanding and agreed to proceed.  Unable to perform video visit due to video visit attempted and failed and/or patient does not have video capability.   Some vital signs may be absent or patient reported.   Willette Brace, LPN   Subjective:   [name] [name] is a 66 y.o. female who presents for Medicare Annual (Subsequent) preventive examination.  Review of Systems     Cardiac Risk Factors include: advanced age (>59mn, >>61women);diabetes mellitus;dyslipidemia;hypertension;obesity (BMI >30kg/m2)     Objective:    There were no vitals filed for this visit. There is no height or weight on file to calculate BMI.  Advanced Directives 04/23/2021 01/18/2021 01/11/2021 06/22/2020 04/02/2020 03/27/2020 08/15/2019  Does Patient Have a Medical Advance Directive? Yes Yes Yes Yes Yes Yes Yes  Type of AParamedicof [place]onevilleLiving will [place]er[place]Living will Living will;Healthcare Power of [place] will  Does patient want to make changes to medical advance directive? - - - No - Patient declined No - Patient declined No - Patient declined Yes (Inpatient - patient requests chaplain consult to change a medical advance directive)  Copy of HTenaflyin Chart? No - copy requested No - copy  requested - No - copy requested - - -  Would patient like information on creating a medical advance directive? - - - - No - Patient declined - -    Current Medications (verified) Outpatient Encounter Medications as of 04/23/2021  Medication Sig   aspirin EC 81 MG tablet Take 81 mg by mouth daily. Swallow whole.   benazepril (LOTENSIN) 10 MG tablet Take 1 tablet (10 mg total) by mouth daily.   blood glucose meter kit and supplies KIT Dispense based on patient and insurance preference. Use up to four times daily as directed. DX E11.9   buPROPion (WELLBUTRIN) 75 MG tablet Take 0.5 tablets (37.5 mg total) by mouth 2 (two) times daily.   Cholecalciferol (VITAMIN D HIGH POTENCY) 25 MCG (1000 UT) capsule Take 1,000 Units by mouth daily.   CRANBERRY PO Take 1 tablet by mouth daily.   escitalopram (LEXAPRO) 20 MG tablet Take 1 tablet (20 mg total) by mouth daily.   glucose blood (ONETOUCH VERIO) test strip USE TO CHECK BLOOD SUGAR DAILY AND PRN   levothyroxine (SYNTHROID) 75 MCG tablet Take 1 tablet (75 mcg total) by mouth daily.   metFORMIN (GLUCOPHAGE XR) 500 MG 24 hr tablet Take 2 tablets (1,000 mg total) by mouth in the morning and at bedtime.   methocarbamol (ROBAXIN) 500 MG tablet Take 500 mg by mouth every 8 (eight) hours as needed for muscle spasms.   omeprazole (PRILOSEC) 40 MG capsule Take 1 capsule (40 mg total) by mouth daily.   simvastatin (ZOCOR) 20 MG tablet Take 1 tablet (20 mg total) by mouth at bedtime. TAKE 1 TABLET BY MOUTH EVERYDAY AT BEDTIME  tiZANidine (ZANAFLEX) 4 MG capsule Take 1 capsule (4 mg total) by mouth 3 (three) times daily as needed for muscle spasms.   traZODone (DESYREL) 50 MG tablet Take 1 tablet (50 mg total) by mouth at bedtime.   No facility-administered encounter medications on file as of 04/23/2021.    Allergies (verified) Adhesive [tape], Nitrofurantoin, and Other   History: Past Medical History:  Diagnosis Date   Acute meniscal tear of knee    Left  knee   Alcohol problem drinking    rehab   Anemia    menstrual related   Anxiety    Arthritis    right ankle-neuropathy   Colon polyps    Depression    DM (diabetes mellitus), type 2 ([place]) 12/2009   type 2   Dyspnea    Evalluate for OSA (obstructive sleep apnea) 08/15/2013   No OSA on sleep study but may be underestimated.-never used cpap     GERD (gastroesophageal reflux disease)    not currently taking.   Hyperlipidemia    Hypertension    Hypothyroidism    Liver disease    Neuromuscular disorder ([place])    neropathy bilateral feet.   Thyroid disease    Past Surgical History:  Procedure Laterality Date   ADENOIDECTOMY     ANTERIOR CERVICAL DECOMP/DISCECTOMY FUSION N/A 01/18/2021   Procedure: Cervical five-six Cervical six-seven Anterior Cervical Decompression/ discectomy Fusion;  Surgeon: Eustace Moore, MD;  Location: [facility];  Service: Neurosurgery;  Laterality: N/A;   BREAST SURGERY Right    lumpectomy-benign   CESAREAN SECTION  1987   1 time   CHOLECYSTECTOMY     HAMMER TOE SURGERY     Dr. Doran Durand [facility]- feb 2022   HAMMERTOE RECONSTRUCTION WITH WEIL OSTEOTOMY Bilateral 04/02/2020   Procedure: Bilateral 2-3 Weil and hammertoe corrections;  Surgeon: Wylene Simmer, MD;  Location: [facility];  Service: Orthopedics;  Laterality: Bilateral;   INNER EAR SURGERY     left ear had tubes put in and removed, scar tissue built up/ loss of hearing in left ear for over a year   MASS EXCISION Left 12/16/2013   Procedure: EXCISION LEFT  DISTAL THIGH MASS;  Surgeon: Mauri Pole, MD;  Location: [facility];  Service: Orthopedics;  Laterality: Left;   right ankle surgery      x 2- no retained hardware   right rotator cuff surgery      left side   TONSILLECTOMY     TOTAL KNEE ARTHROPLASTY  2009   right   TOTAL KNEE ARTHROPLASTY Left 05/05/2015   Procedure: LEFT TOTAL KNEE ARTHROPLASTY;  Surgeon: Paralee Cancel, MD;  Location: [facility];  Service: Orthopedics;  Laterality:  Left;   TOTAL KNEE REVISION Right 12/16/2013   Procedure: RIGHT TOTAL KNEE REVISION POLY EXCHANGE;  Surgeon: Mauri Pole, MD;  Location[facility];  Service: Orthopedics;  Laterality: Right;   TOTAL KNEE REVISION Left 08/15/2019   Procedure: LEFT KNEE REVISION;  Surgeon: Paralee Cancel, MD;  Location: [facility];  Service: Orthopedics;  Laterality: Left;  2 hrs   TUBAL LIGATION  1987   Family History  Adopted: Yes   Social History   Socioeconomic History   Marital status: Married    Spouse name: Not on file   Number of children: Not on file   Years of education: Not on file   Highest education level: Not on file  Occupational History   Not on file  Tobacco Use   Smoking  status: Former    Packs/day: 1.50    Years: 30.00    Pack years: 45.00    Types: Cigarettes    Start date: 1969    Quit date: 02/21/2009    Years since quitting: 12.1   Smokeless tobacco: Never  Vaping Use   Vaping Use: Former   Substances: CBD  Substance and Sexual Activity   Alcohol use: Not Currently   Drug use: Not Currently    Types: Marijuana    Comment: last time 3 days ago   Sexual activity: Yes    Birth control/protection: Post-menopausal  Other Topics Concern   Not on file  Social History Narrative   Married. 1 daughter. No grandkids. 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       Hobbies: shopping, travel   Social Determinants of Health   Financial Resource Strain: Low Risk    Difficulty of Paying Living Expenses: Not hard at all  Food Insecurity: No Food Insecurity   Worried About Charity fundraiser in the Last Year: Never true   Arboriculturist in the Last Year: Never true  Transportation Needs: No Transportation Needs   Lack of Transportation (Medical): No   Lack of Transportation (Non-Medical): No  Physical Activity: Inactive   Days of Exercise per Week: 0 days   Minutes of Exercise per Session: 0 min  Stress: No Stress Concern Present   Feeling of  Stress : Not at all  Social Connections: Moderately Integrated   Frequency of Communication with Friends and Family: More than three times a week   Frequency of Social Gatherings with Friends and Family: More than three times a week   Attends Religious Services: Never   Marine scientist or Organizations: Yes   Attends Music therapist: 1 to 4 times per year   Marital Status: Married    Tobacco Counseling Counseling given: Not Answered   Clinical Intake:  Pre-visit preparation completed: Yes  Pain : No/denies pain     BMI - recorded: 35.04 Nutritional Status: BMI > 30  Obese Nutritional Risks: None Diabetes: Yes CBG done?: No Did pt. bring in CBG monitor from home?: No  How often do you need to have someone help you when you read instructions, pamphlets, or other written materials from your doctor or pharmacy?: 1 - Never  Diabetic?Nutrition Risk Assessment:  Has the patient had any N/V/D within the last 2 months?  No  Does the patient have any non-healing wounds?  No  Has the patient had any unintentional weight loss or weight gain?  No   Diabetes:  Is the patient diabetic?  Yes  If diabetic, was a CBG obtained today?  No  Did the patient bring in their glucometer from home?  No  How often do you monitor your CBG's? N/A.   Financial Strains and Diabetes Management:  Are you having any financial strains with the device, your supplies or your medication? No .  Does the patient want to be seen by Chronic Care Management for management of their diabetes?  No  Would the patient like to be referred to a Nutritionist or for Diabetic Management?  No   Diabetic Exams:  Diabetic Eye Exam: Completed 02/09/21 Diabetic Foot Exam: Completed 07/03/20   Interpreter Needed?: No  Information entered by :: Charlott Rakes, LPN   Activities of Daily Living In your present state of health, do you have any difficulty  performing the following activities:  04/23/2021 01/11/2021  Hearing? Tempie Donning  Vision? N N  Difficulty concentrating or making decisions? N N  Walking or climbing stairs? Y N  Comment SOB -  Dressing or bathing? N N  Doing errands, shopping? N N  Preparing Food and eating ? N -  Using the Toilet? N -  In the past six months, have you accidently leaked urine? N -  Do you have problems with loss of bowel control? N -  Managing your Medications? N -  Managing your Finances? N -  Housekeeping or managing your Housekeeping? N -  Some recent data might be hidden    Patient Care Team: Marin Olp, MD as PCP - General (Family Medicine) Minus Breeding, MD as PCP - Cardiology (Cardiology) [facility] as Consulting Physician Paralee Cancel, MD as Consulting Physician (Orthopedic Surgery) Melina Schools, MD as Consulting Physician (Orthopedic Surgery) Roseanne Kaufman, MD as Consulting Physician (Orthopedic Surgery)  Indicate any recent [place] you may have received from other than [facility] providers in the past year (date may be approximate).     Assessment:   This is a routine wellness examination for [name] [name].  Hearing/Vision screen Hearing Screening - Comments:: Pt wears hearing aid  Vision Screening - Comments:: Pt follows up with Dr Katy Fitch For annual eye exams   Dietary issues and exercise activities discussed: Current Exercise Habits: The patient does not participate in regular exercise at present   Goals Addressed             This Visit's Progress    Patient Stated       Lose weight        Depression Screen PHQ 2/9 Scores 04/23/2021 12/14/2020 07/28/2020 04/13/2020 08/09/2019 06/19/2018 11/13/2017  PHQ - 2 Score 0 0 0 0 0 1 0  PHQ- 9 Score - 1 - - 0 3 9  Some encounter information is confidential and restricted. Go to Review Flowsheets activity to see all data.    Fall Risk Fall Risk  04/23/2021 07/28/2020 04/13/2020 08/09/2019 05/24/2016  Falls in the past year? 1 1 0 0 No  Number falls in  past yr: 1 1 0 0 -  Injury with Fall? 0 0 0 0 -  Risk for fall due to : Impaired vision;Impaired balance/gait - - - -  Follow up Falls prevention discussed - - - -    FALL RISK PREVENTION PERTAINING TO THE HOME:  Any stairs in or around the home? No  If so, are there any without handrails? No  Home free of loose throw rugs in walkways, pet beds, electrical cords, etc? Yes  Adequate lighting in your home to reduce risk of falls? Yes   ASSISTIVE DEVICES UTILIZED TO PREVENT FALLS:  Life alert? No  Use of a cane, walker or w/c? No  Grab bars in the bathroom? No  Shower chair or bench in shower? No  Elevated toilet seat or a handicapped toilet? No   TIMED UP AND GO:  Was the test performed? No .  Cognitive Function:     6CIT Screen 04/23/2021  What Year? 0 points  What month? 0 points  What time? 0 points  Count back from 20 0 points  Months in reverse 0 points  Repeat phrase 2 points  Total Score 2    Immunizations Immunization History  Administered Date(s) Administered   Fluad Quad(high Dose 65+) 12/09/2019, 12/14/2020   Influenza Split 01/06/2011   Influenza, Quadrivalent, Recombinant,  Inj, Pf 11/02/2018   Influenza,inj,Quad PF,6+ Mos 12/14/2012, 11/21/2013, 11/06/2014, 11/28/2016, 11/13/2017   Influenza-Unspecified 11/27/2015   Moderna Covid-19 Vaccine Bivalent Booster 10yr & up 01/06/2021   Moderna Sars-Covid-2 Vaccination 04/17/2019, 05/15/2019, 12/30/2019, 05/26/2020   Pneumococcal Conjugate-13 10/17/2013   Pneumococcal Polysaccharide-23 03/13/2015   Pneumococcal-Unspecified 02/21/2006   Tdap 06/06/2012   Zoster Recombinat (Shingrix) 02/08/2019, 05/30/2019   Zoster, Live 10/09/2014    TDAP status: Up to date  Flu Vaccine status: Up to date  Pneumococcal vaccine status: Up to date  Covid-19 vaccine status: Completed vaccines  Qualifies for Shingles Vaccine? Yes   Zostavax completed Yes   Shingrix Completed?: Yes  Screening Tests Health Maintenance   Topic Date Due   HEMOGLOBIN A1C  06/10/2021   FOOT EXAM  07/03/2021   MAMMOGRAM  07/29/2021   OPHTHALMOLOGY EXAM  02/09/2022   TETANUS/TDAP  06/07/2022   COLONOSCOPY (Pts 45-462yrInsurance coverage will need to be confirmed)  01/11/2023   INFLUENZA VACCINE  Completed   DEXA SCAN  Completed   COVID-19 Vaccine  Completed   Hepatitis C Screening  Completed   Zoster Vaccines- Shingrix  Completed   HPV VACCINES  Aged Out   Pneumonia Vaccine 6560Years old  Discontinued    Health Maintenance  There are no preventive care reminders to display for this patient.  Colorectal cancer screening: Type of screening: Colonoscopy. Completed 01/10/18. Repeat every 5 years  Mammogram status: Completed 07/29/20. Repeat every year  Bone Density status: Completed 12/11/19. Results reflect: Bone density results: NORMAL. Repeat every 2 years.  Additional Screening:  Hepatitis C Screening: Completed 04/11/14  Vision Screening: Recommended annual ophthalmology exams for early detection of glaucoma and other disorders of the eye. Is the patient up to date with their annual eye exam?  Yes  Who is the provider or what is the name of the office in which the patient attends annual eye exams? Dr GrKaty FitchIf pt is not established with a provider, would they like to be referred to a provider to establish care? No .   Dental Screening: Recommended annual dental exams for proper oral hygiene  Community Resource Referral / Chronic Care Management: CRR required this visit?  No   CCM required this visit?  No      Plan:     I have personally reviewed and noted the following in the patients chart:   Medical and social history Use of alcohol, tobacco or illicit drugs  Current medications and supplements including opioid prescriptions.  Functional ability and status Nutritional status Physical activity Advanced directives List of other physicians Hospitalizations, surgeries, and ER visits in previous 12  months Vitals Screenings to include cognitive, depression, and falls Referrals and appointments  In addition, I have reviewed and discussed with patient certain preventive protocols, quality metrics, and best practice recommendations. A written personalized care plan for preventive services as well as general preventive health recommendations were provided to patient.     TiWillette BraceLPN   3/03/27/5571 Nurse Notes: None

## 2021-04-26 ENCOUNTER — Telehealth: Payer: Self-pay | Admitting: Family Medicine

## 2021-04-26 NOTE — Chronic Care Management (AMB) (Signed)
?  Chronic Care Management  ? ?Outreach Note ? ?04/26/2021 ?Name: [name] [name] MRN: [id number] DOB: [birth year] ? ?Referred by: Marin Olp, MD ?Reason for referral : No chief complaint on file. ? ? ?Third unsuccessful telephone outreach was attempted today. The patient was referred to the pharmacist for assistance with care management and care coordination.  ? ?Follow Up Plan: PT IS OUT OF STATE WILL CALL ME ONCE SHE RETURNS ? ?Tatjana Dellinger ?Upstream Scheduler  ?

## 2021-05-07 ENCOUNTER — Other Ambulatory Visit: Payer: Self-pay | Admitting: Family Medicine

## 2021-05-07 DIAGNOSIS — E039 Hypothyroidism, unspecified: Secondary | ICD-10-CM

## 2021-06-11 ENCOUNTER — Other Ambulatory Visit: Payer: Self-pay

## 2021-06-11 ENCOUNTER — Other Ambulatory Visit: Payer: Self-pay | Admitting: *Deleted

## 2021-06-11 ENCOUNTER — Encounter: Payer: Self-pay | Admitting: Family Medicine

## 2021-06-11 ENCOUNTER — Other Ambulatory Visit: Payer: Self-pay | Admitting: Family Medicine

## 2021-06-11 DIAGNOSIS — E119 Type 2 diabetes mellitus without complications: Secondary | ICD-10-CM

## 2021-06-11 MED ORDER — BLOOD GLUCOSE MONITOR KIT: PACK | 3 refills | Status: DC

## 2021-06-11 MED ORDER — BLOOD GLUCOSE MONITOR KIT: PACK | 0 refills | Status: DC

## 2021-06-12 ENCOUNTER — Other Ambulatory Visit: Payer: Self-pay | Admitting: Family Medicine

## 2021-06-12 MED ORDER — FLURBIPROFEN 100 MG PO TABS: 100.0000 mg | ORAL_TABLET | Freq: Two times a day (BID) | ORAL | 2 refills | Status: DC | PRN

## 2021-06-14 ENCOUNTER — Telehealth: Payer: Self-pay | Admitting: Family Medicine

## 2021-06-14 NOTE — Progress Notes (Signed)
?  Chronic Care Management  ? ?Note ? ?06/14/2021 ?Name: [name] [name] MRN: [id number] DOB: November 21, 1955 ? ?[name] [name] is a 66 y.o. year old female who is a primary care patient of Marin Olp, MD. I reached out to [name] by phone today in response to a referral sent by Ms. Curt Bears Sawa's PCP, Marin Olp, MD.  ? ?Ms. Shuping was given information about Chronic Care Management services today including:  ?CCM service includes personalized support from designated clinical staff supervised by her physician, including individualized plan of care and coordination with other care providers ?24/7 contact phone numbers for assistance for urgent and routine care needs. ?Service will only be billed when office clinical staff spend 20 minutes or more in a month to coordinate care. ?Only one practitioner may furnish and bill the service in a calendar month. ?The patient may stop CCM services at any time (effective at the end of the month) by phone call to the office staff. ? ? ?Patient agreed to services and verbal consent obtained.  ? ?Follow up plan:pt aware  ded ? ? ?Tatjana Dellinger ?Upstream Scheduler  ?

## 2021-06-16 ENCOUNTER — Telehealth: Payer: Self-pay

## 2021-06-16 ENCOUNTER — Other Ambulatory Visit: Payer: Self-pay | Admitting: *Deleted

## 2021-06-16 MED ORDER — ONETOUCH VERIO W/DEVICE KIT: PACK | 0 refills | Status: AC

## 2021-06-16 MED ORDER — ONETOUCH DELICA LANCETS 33G MISC: 5 refills | Status: AC

## 2021-06-16 MED ORDER — ONETOUCH VERIO VI STRP: ORAL_STRIP | 12 refills | Status: AC

## 2021-06-16 MED ORDER — ONETOUCH ULTRASOFT LANCETS MISC: 12 refills | Status: DC

## 2021-06-16 NOTE — Telephone Encounter (Signed)
Rx re-sent to the pharmacy as requested. ?

## 2021-06-16 NOTE — Telephone Encounter (Signed)
In speaking with the pharmacist, The Lancets should be the delicate  eithr 30 or 33 gage. not ultrasoft. Also, in the instructions please state for one time use or 1 time daily. Medicare does not cover multiple use unless patient is diabetic per pharmacist. ?

## 2021-06-16 NOTE — Telephone Encounter (Signed)
Spoke to [place] and she stated that each had to be sent separately, strips, kit and lancets, dx code is needed and insurance only covers for testing 1 time daily. Rx's re-sent to the pharmacy with correct information.  ?

## 2021-06-16 NOTE — Telephone Encounter (Signed)
Is requesting a call back in regard to diabetic testing supplies.  Script was sent over incorrectly for Medicare to cover.  Please give a call back in regard.

## 2021-06-21 ENCOUNTER — Other Ambulatory Visit (INDEPENDENT_AMBULATORY_CARE_PROVIDER_SITE_OTHER): Payer: Medicare Other

## 2021-06-21 ENCOUNTER — Telehealth: Payer: Self-pay | Admitting: Pharmacist

## 2021-06-21 DIAGNOSIS — E1169 Type 2 diabetes mellitus with other specified complication: Secondary | ICD-10-CM

## 2021-06-21 DIAGNOSIS — E119 Type 2 diabetes mellitus without complications: Secondary | ICD-10-CM | POA: Diagnosis not present

## 2021-06-21 DIAGNOSIS — E1159 Type 2 diabetes mellitus with other circulatory complications: Secondary | ICD-10-CM | POA: Diagnosis not present

## 2021-06-21 DIAGNOSIS — E039 Hypothyroidism, unspecified: Secondary | ICD-10-CM

## 2021-06-21 DIAGNOSIS — E785 Hyperlipidemia, unspecified: Secondary | ICD-10-CM | POA: Diagnosis not present

## 2021-06-21 DIAGNOSIS — I152 Hypertension secondary to endocrine disorders: Secondary | ICD-10-CM | POA: Diagnosis not present

## 2021-06-21 LAB — CBC WITH DIFFERENTIAL/PLATELET
Basophils Absolute: 0 10*3/uL (ref 0.0–0.1)
Basophils Relative: 0.3 % (ref 0.0–3.0)
Eosinophils Absolute: 0.1 10*3/uL (ref 0.0–0.7)
Eosinophils Relative: 0.9 % (ref 0.0–5.0)
HCT: 38.2 % (ref 36.0–46.0)
Hemoglobin: 13.3 g/dL (ref 12.0–15.0)
Lymphocytes Relative: 43 % (ref 12.0–46.0)
Lymphs Abs: 2.5 10*3/uL (ref 0.7–4.0)
MCHC: 35 g/dL (ref 30.0–36.0)
MCV: 95.3 fl (ref 78.0–100.0)
Monocytes Absolute: 0.5 10*3/uL (ref 0.1–1.0)
Monocytes Relative: 9.1 % (ref 3.0–12.0)
Neutro Abs: 2.7 10*3/uL (ref 1.4–7.7)
Neutrophils Relative %: 46.7 % (ref 43.0–77.0)
Platelets: 183 10*3/uL (ref 150.0–400.0)
RBC: 4 Mil/uL (ref 3.87–5.11)
RDW: 14.1 % (ref 11.5–15.5)
WBC: 5.8 10*3/uL (ref 4.0–10.5)

## 2021-06-21 LAB — COMPREHENSIVE METABOLIC PANEL
ALT: 10 U/L (ref 0–35)
AST: 13 U/L (ref 0–37)
Albumin: 3.5 g/dL (ref 3.5–5.2)
Alkaline Phosphatase: 52 U/L (ref 39–117)
BUN: 13 mg/dL (ref 6–23)
CO2: 29 mEq/L (ref 19–32)
Calcium: 8.9 mg/dL (ref 8.4–10.5)
Chloride: 106 mEq/L (ref 96–112)
Creatinine, Ser: 0.91 mg/dL (ref 0.40–1.20)
GFR: 65.58 mL/min (ref >60.00)
Glucose, Bld: 114 mg/dL — ABNORMAL HIGH (ref 70–99)
Potassium: 4.4 mEq/L (ref 3.5–5.1)
Sodium: 142 mEq/L (ref 135–145)
Total Bilirubin: 0.9 mg/dL (ref 0.2–1.2)
Total Protein: 5.4 g/dL — ABNORMAL LOW (ref 6.0–8.3)

## 2021-06-21 LAB — LIPID PANEL
Cholesterol: 122 mg/dL (ref 0–200)
HDL: 55.1 mg/dL (ref >39.00)
LDL Cholesterol: 53 mg/dL (ref 0–99)
NonHDL: 66.53
Total CHOL/HDL Ratio: 2
Triglycerides: 69 mg/dL (ref 0.0–149.0)
VLDL: 13.8 mg/dL (ref 0.0–40.0)

## 2021-06-21 LAB — TSH: TSH: 1.65 u[IU]/mL (ref 0.35–5.50)

## 2021-06-21 LAB — HEMOGLOBIN A1C: Hgb A1c MFr Bld: 5.5 % (ref 4.6–6.5)

## 2021-06-21 NOTE — Progress Notes (Signed)
? ?Chronic Care Management ?Pharmacy Note ? ?06/23/2021 ?Name:  [name] [name] MRN:  [id number] DOB:  June 11, 1955 ? ?Summary: ?Initial visit with PharmD.  Reports increased anger - she was not taking her Lexapro by accident.  Discovered it had not been filled since Feb.  Also reports a lot of glucose readings around the 70s and 80s - did not realize these were on the low end of normal. ? ?Recommendations/Changes made from today's visit: ?Consider decrease of Metformin XR to 561m BID ?Rx already called in for her to resume Lexapro - consider 135mdaily for first 4 weeks ? ?Plan: ?FU 6 months ? ? ?Subjective: ?[name] [name] an [age].o. year old female who is a primary patient of Hunter, StBrayton MarsMD.  The CCM team was consulted for assistance with disease management and care coordination needs.   ? ?Engaged with patient face to face for follow up visit in response to provider referral for pharmacy case management and/or care coordination services.  ? ?Consent to Services:  ?The patient was given the following information about Chronic Care Management services today, agreed to services, and gave verbal consent: 1. CCM service includes personalized support from designated clinical staff supervised by the primary care provider, including individualized plan of care and coordination with other care providers 2. 24/7 contact phone numbers for assistance for urgent and routine care needs. 3. Service will only be billed when office clinical staff spend 20 minutes or more in a month to coordinate care. 4. Only one practitioner may furnish and bill the service in a calendar month. 5.The patient may stop CCM services at any time (effective at the end of the month) by phone call to the office staff. 6. The patient will be responsible for cost sharing (co-pay) of up to 20% of the service fee (after annual deductible is met). Patient agreed to services and consent obtained. ? ?Patient Care Team: ?HuMarin OlpMD as PCP -  General (Family Medicine) ?HoMinus BreedingMD as PCP - Cardiology (Cardiology) ?[facility] as Consulting Physician ?OlParalee CancelMD as Consulting Physician (Orthopedic Surgery) ?BrMelina SchoolsMD as Consulting Physician (Orthopedic Surgery) ?GrRoseanne KaufmanMD as Consulting Physician (Orthopedic Surgery) ?DaEdythe ClarityRPLouisiana Extended [facility] Pharmacist (Pharmacist) ? ?Recent office visits:  ?None ?  ?Recent consult visits:  ?03/01/2021 [facility] (Pulmonology) WaMartyn EhrichNP; no medication changes indicated. ?  ?Hospital visits:  ?01/18/2021 Admission for Elective Surgery ACDF C5-6, C6-7 ?Admit date: 01/18/2021 ?Discharge date: 01/19/2021 ?-STOP taking Bactrim DS ? ?Objective: ? ?Lab Results  ?Component Value Date  ? CREATININE 0.91 06/21/2021  ? BUN 13 06/21/2021  ? GFR 65.58 06/21/2021  ? EGFR 65 12/10/2020  ? GFRNONAA >60 01/11/2021  ? GFRAA 75 04/08/2020  ? NA 142 06/21/2021  ? K 4.4 06/21/2021  ? CALCIUM 8.9 06/21/2021  ? CO2 29 06/21/2021  ? GLUCOSE 114 (H) 06/21/2021  ? ? ?Lab Results  ?Component Value Date/Time  ? HGBA1C 5.5 06/21/2021 09:46 AM  ? HGBA1C 5.5 12/10/2020 11:05 AM  ? HGBA1C 6.1 12/04/2019 12:00 AM  ? HGBA1C 5.3 11/06/2017 12:00 AM  ? GFR 65.58 06/21/2021 09:46 AM  ? GFR 74.09 05/17/2016 01:05 PM  ? MICROALBUR 3.6 (H) 01/08/2015 09:08 AM  ? MICROALBUR 0.2 10/17/2013 02:15 PM  ?  ?Last diabetic Eye exam:  ?Lab Results  ?Component Value Date/Time  ? HMDIABEYEEXA No Retinopathy 02/09/2021 12:00 AM  ?  ?Last diabetic Foot exam: No results found for: HMDIABFOOTEX  ? ?Lab Results  ?  Component Value Date  ? CHOL 122 06/21/2021  ? HDL 55.10 06/21/2021  ? [place] 53 06/21/2021  ? LDLDIRECT 158.6 02/26/2014  ? TRIG 69.0 06/21/2021  ? CHOLHDL 2 06/21/2021  ? ? ? ?  Latest Ref Rng & Units 06/21/2021  ?  9:46 AM 12/10/2020  ? 11:05 AM 04/08/2020  ?  3:17 PM  ?Hepatic Function  ?Total Protein 6.0 - 8.3 g/dL 5.4   5.5   6.5    ?Albumin 3.5 - 5.2 g/dL 3.5    4.0    ?AST 0 - 37 U/L '13   12   16     ' ?ALT 0 - 35 U/L '10   9   8    ' ?Alk Phosphatase 39 - 117 U/L 52    77    ?Total Bilirubin 0.2 - 1.2 mg/dL 0.9   0.9   1.1    ? ? ?Lab Results  ?Component Value Date/Time  ? TSH 1.65 06/21/2021 09:46 AM  ? TSH 2.05 07/21/2020 01:47 PM  ? FREET4 1.40 08/15/2013 04:28 PM  ? FREET4 1.54 04/18/2011 02:50 PM  ? ? ? ?  Latest Ref Rng & Units 06/21/2021  ?  9:46 AM 01/11/2021  ? 11:40 AM 12/10/2020  ? 11:05 AM  ?CBC  ?WBC 4.0 - 10.5 K/uL 5.8   5.5   5.8    ?Hemoglobin 12.0 - 15.0 g/dL 13.3   13.6   13.7    ?Hematocrit 36.0 - 46.0 % 38.2   39.8   39.0    ?Platelets 150.0 - 400.0 K/uL 183.0   188   197.0    ? ? ?No results found for: VD25OH ? ?Clinical ASCVD: No  ?The ASCVD Risk score (Arnett DK, et al., 2019) failed to calculate for the following reasons: ?  The valid total cholesterol range is 130 to 320 mg/dL   ? ? ?  04/23/2021  ?  3:02 PM 12/14/2020  ?  1:38 PM 07/28/2020  ? 10:52 AM  ?Depression screen PHQ 2/9  ?Decreased Interest 0 0 0  ?Down, Depressed, Hopeless 0 0 0  ?PHQ - 2 Score 0 0 0  ?Altered sleeping  0   ?Tired, decreased energy  0   ?Change in appetite  0   ?Feeling bad or failure about yourself   1   ?Trouble concentrating  0   ?Moving slowly or fidgety/restless  0   ?Suicidal thoughts  0   ?PHQ-9 Score  1   ?Difficult doing work/chores  Not difficult at all   ?  ? ? ?Social History  ? ?Tobacco Use  ?Smoking Status Former  ? Packs/day: 1.50  ? Years: 30.00  ? Pack years: 45.00  ? Types: Cigarettes  ? Start date: 1969  ? Quit date: 02/21/2009  ? Years since quitting: 12.3  ?Smokeless Tobacco Never  ? ?BP Readings from Last 3 Encounters:  ?01/19/21 (!) 167/75  ?01/11/21 119/63  ?12/14/20 (!) 104/58  ? ?Pulse Readings from Last 3 Encounters:  ?01/19/21 77  ?01/11/21 70  ?12/14/20 63  ? ?Wt Readings from Last 3 Encounters:  ?01/18/21 217 lb (98.4 kg)  ?01/11/21 221 lb 3.2 oz (100.3 kg)  ?12/14/20 221 lb 9.6 oz (100.5 kg)  ? ?BMI Readings from Last 3 Encounters:  ?01/18/21 35.02 kg/m?  ?01/11/21 35.70 kg/m?  ?12/14/20  35.77 kg/m?  ? ? ?Assessment/Interventions: Review of patient past medical history, allergies, medications, health status, including review of consultants reports, laboratory and other test data,  was performed as part of comprehensive evaluation and provision of chronic care management services.  ? ?SDOH:  (Social Determinants of Health) assessments and interventions performed: Yes ? ?Financial Resource Strain: Low Risk   ? Difficulty of Paying Living Expenses: Not hard at all  ? ?Food Insecurity: No Food Insecurity  ? Worried About Charity fundraiser in the Last Year: Never true  ? Ran Out of Food in the Last Year: Never true  ? ? ?SDOH Screenings  ? ?Alcohol Screen: Not on file  ?Depression (PHQ2-9): Low Risk   ? PHQ-2 Score: 0  ?Financial Resource Strain: Low Risk   ? Difficulty of Paying Living Expenses: Not hard at all  ?Food Insecurity: No Food Insecurity  ? Worried About Charity fundraiser in the Last Year: Never true  ? Ran Out of Food in the Last Year: Never true  ?Housing: Low Risk   ? Last Housing Risk Score: 0  ?Physical Activity: Inactive  ? Days of Exercise per Week: 0 days  ? Minutes of Exercise per Session: 0 min  ?Social Connections: Moderately Integrated  ? Frequency of Communication with Friends and Family: More than three times a week  ? Frequency of Social Gatherings with Friends and Family: More than three times a week  ? Attends Religious Services: Never  ? Active Member of Clubs or Organizations: Yes  ? Attends Archivist Meetings: 1 to 4 times per year  ? Marital Status: Married  ?Stress: No Stress Concern Present  ? Feeling of Stress : Not at all  ?Tobacco Use: Medium Risk  ? Smoking Tobacco Use: Former  ? Smokeless Tobacco Use: Never  ? Passive Exposure: Not on file  ?Transportation Needs: No Transportation Needs  ? Lack of Transportation (Medical): No  ? Lack of Transportation (Non-Medical): No  ? ? ?CCM Care Plan ? ?Allergies  ?Allergen Reactions  ? Adhesive [Tape] Other (See  Comments)  ?  blisters  ? Nitrofurantoin   ?  Diffuse rash  ? Other Other (See Comments)  ?  (Neoprene)--causes blisters.   ? ? ?Medications Reviewed Today   ? ? Reviewed by Edythe Clarity, [place] (Pharmac

## 2021-06-21 NOTE — Progress Notes (Signed)
? ? ?Chronic Care Management ?Pharmacy Assistant  ? ?Name: [name] [name]  MRN: [id number] DOB: 1955/09/02 ? ? ?Reason for Encounter: Chart Review For Initial Visit With Clinical Pharmacist ?  ?Conditions to be addressed/monitored: ?HTN, DM II, HLD, Hypothyroidism ? ?Primary concerns for visit include: ?  ? ?Recent office visits:  ?None ? ?Recent consult visits:  ?03/01/2021 [facility] (Pulmonology) Martyn Ehrich, NP; no medication changes indicated. ? ?Hospital visits:  ?01/18/2021 Admission for Elective Surgery ACDF C5-6, C6-7 ?Admit date: 01/18/2021 ?Discharge date: 01/19/2021 ?-STOP taking Bactrim DS ? ?Medications: ?Outpatient Encounter Medications as of 06/21/2021  ?Medication Sig  ? aspirin EC 81 MG tablet Take 81 mg by mouth daily. Swallow whole.  ? benazepril (LOTENSIN) 10 MG tablet Take 1 tablet (10 mg total) by mouth daily.  ? benazepril (LOTENSIN) 20 MG tablet TAKE ONE TABLET BY MOUTH DAILY  ? Blood Glucose Monitoring Suppl (ONETOUCH VERIO) w/Device KIT Use to test blood sugar daily  ? buPROPion (WELLBUTRIN) 75 MG tablet Take 0.5 tablets (37.5 mg total) by mouth 2 (two) times daily.  ? Cholecalciferol (VITAMIN D HIGH POTENCY) 25 MCG (1000 UT) capsule Take 1,000 Units by mouth daily.  ? CRANBERRY PO Take 1 tablet by mouth daily.  ? escitalopram (LEXAPRO) 20 MG tablet Take 1 tablet (20 mg total) by mouth daily.  ? flurbiprofen (ANSAID) 100 MG tablet Take 1 tablet (100 mg total) by mouth 2 (two) times daily as needed (for headaches).  ? glucose blood (ONETOUCH VERIO) test strip USE TO CHECK BLOOD SUGAR DAILY  ? levothyroxine (SYNTHROID) 75 MCG tablet TAKE ONE TABLET BY MOUTH DAILY  ? metFORMIN (GLUCOPHAGE-XR) 500 MG 24 hr tablet TAKE TWO TABLETS BY MOUTH EVERY MORNING AND IN THE EVENING  ? methocarbamol (ROBAXIN) 500 MG tablet Take 500 mg by mouth every 8 (eight) hours as needed for muscle spasms.  ? omeprazole (PRILOSEC) 40 MG capsule Take 1 capsule (40 mg total) by mouth daily.  ? OneTouch Delica Lancets 38S  MISC Use to test blood sugar daily  ? simvastatin (ZOCOR) 20 MG tablet TAKE ONE TABLET BY MOUTH EVERY NIGHT AT BEDTIME  ? tiZANidine (ZANAFLEX) 4 MG capsule Take 1 capsule (4 mg total) by mouth 3 (three) times daily as needed for muscle spasms.  ? traZODone (DESYREL) 50 MG tablet Take 1 tablet (50 mg total) by mouth at bedtime.  ? ?No facility-administered encounter medications on file as of 06/21/2021.  ? ?Current Medications: ?One Touch Delica Lancets 50N ?One Touch Verio test strip ?Flurbiprofen 100 mg ?Levothyroxine 75 mcg ?Benazepril 20 mg last filled  ?Metformin 500 mg ?Simvastatin 20 mg ?Benazepril 10 mg ?Omeprazole 40 mg ?Tizanidine 4 mg ?Buproprion 75 mg ?Trazodone 50 mg ?Escitalopram 20 mg ?Vitamin D ?Robaxin 500 mg ?Cranberry PO ?Aspirin 81 mg ? ?Patient Questions: ?Any changes in your medications or health? ? ?Any side effects from any medications?  ? ?Do you have any symptoms or problems not managed by your medications? ? ?Any concerns about your health right now? ? ?Has your provider asked that you check blood pressure, blood sugar, or follow special diet at home? ? ?Do you get any type of exercise on a regular basis? ? ?Can you think of a goal you would like to reach for your health? ? ?Do you have any problems getting your medications? ? ?Is there anything that you would like to discuss during the appointment?  ? ?Please bring medications and supplements to appointment ? ?**Unsuccessful attempt at reaching patient to complete this  time call.** ? ?Care Gaps: ?Medicare Annual Wellness: Completed 04/23/2021 ?Ophthalmology Exam: Next due on 02/09/2022 ?Foot Exam: Next due on 07/03/2021 ?Hemoglobin A1C: 5.5% on 12/10/2020 ?Colonoscopy: Next due on 01/11/2023 ?Dexa Scan: Completed ?Mammogram: Next due on 07/29/2021 ? ?Future Appointments  ?Date Time Provider [place]  ?06/22/2021 11:00 AM [facility] CCM PHARMACIST [facility]  ?06/24/2021  1:20 PM Marin Olp, MD [facility]  ?05/06/2022  1:00 PM  [facility] HEALTH COACH [facility]  ? ?Star Rating Drugs: ? ?April D Calhoun, [place] ?Clinical Pharmacist Assistant ?[phone] ?

## 2021-06-22 ENCOUNTER — Ambulatory Visit (INDEPENDENT_AMBULATORY_CARE_PROVIDER_SITE_OTHER): Payer: Medicare Other | Admitting: Pharmacist

## 2021-06-22 DIAGNOSIS — E118 Type 2 diabetes mellitus with unspecified complications: Secondary | ICD-10-CM

## 2021-06-22 DIAGNOSIS — I10 Essential (primary) hypertension: Secondary | ICD-10-CM

## 2021-06-22 DIAGNOSIS — F325 Major depressive disorder, single episode, in full remission: Secondary | ICD-10-CM

## 2021-06-22 DIAGNOSIS — E785 Hyperlipidemia, unspecified: Secondary | ICD-10-CM

## 2021-06-23 ENCOUNTER — Other Ambulatory Visit: Payer: Self-pay | Admitting: Family Medicine

## 2021-06-23 MED ORDER — ESCITALOPRAM OXALATE 20 MG PO TABS: 20.0000 mg | ORAL_TABLET | Freq: Every day | ORAL | 3 refills | Status: DC

## 2021-06-23 NOTE — Patient Instructions (Addendum)
Visit Information ? ? Goals Addressed   ? ?  ?  ?  ?  ? This Visit's Progress  ?  Track and Manage My Symptoms-Depression     ?  Timeframe:  Long-Range Goal ?Priority:  High ?Start Date:   06/23/21                          ?Expected End Date:  12/26/21                    ? ?Follow Up Date 09/25/21  ?  ?- avoid negative self-talk ?- exercise at least 2 to 3 times per week ?- have a plan for how to handle bad days  ?  ?Why is this important?   ?Keeping track of your progress will help your treatment team find the right mix of medicine and therapy for you.  ?Write in your journal every day.  ?Day-to-day changes in depression symptoms are normal. It may be more helpful to check your progress at the end of each week instead of every day.   ?  ?Notes: Resuming lexapro today (06/23/21) ?  ? ?  ? ?Patient Care Plan: General Pharmacy (Adult)  ?  ? ?Problem Identified: HTN, Type II DM, HLD, Hypothyroidism, Osteoarthritis, Depression   ?Priority: High  ?Onset Date: 06/23/2021  ?  ? ?Long-Range Goal: Patient-Specific Goal   ?Start Date: 06/23/2021  ?Expected End Date: 12/24/2021  ?This Visit's Progress: On track  ?Priority: High  ?Note:   ?Current Barriers:  ?Does not adhere to prescribed medication regimen ?Possible hypoglycemia ? ?Pharmacist Clinical Goal(s):  ?Patient will achieve improvement in anger symptoms as evidenced by patient reporting through collaboration with PharmD and provider.  ? ?Interventions: ?1:1 collaboration with Marin Olp, MD regarding development and update of comprehensive plan of care as evidenced by provider attestation and co-signature ?Inter-disciplinary care team collaboration (see longitudinal plan of care) ?Comprehensive medication review performed; medication list updated in electronic medical record ? ?Hypertension (BP goal <130/80) ?-Controlled ?-Current treatment: ?Benazepril '10mg'$  Appropriate, Effective, Safe, Accessible ?-Medications previously tried: Benazepril '20mg'$   ?-Current home  readings: "on the lower end", office BP has been well controlled ?-Denies hypotensive/hypertensive symptoms ?-Educated on BP goals and benefits of medications for prevention of heart attack, stroke and kidney damage; ?Daily salt intake goal < 2300 mg; ?Importance of home blood pressure monitoring; ?-Counseled to monitor BP at home a few times per week, document, and provide log at future appointments ?-Recommended to continue current medication ?I think BP is controlled on the '10mg'$  dose.  She was a little confused about which one to take but clarified to continue to take the '10mg'$  dose moving forward. ? ?Hyperlipidemia: (LDL goal < 70) ?-Controlled ?-Current treatment: ?Simvastatin '20mg'$  Appropriate, Effective, Safe, Accessible ?-Medications previously tried: none noted  ?-Educated on Cholesterol goals;  ?Benefits of statin for ASCVD risk reduction; ?-Recommended to continue current medication ?LDL well controlled, tolerating well there is no need to make changes at this time. ? ?Diabetes (A1c goal <6.5%) ?-Controlled ?-Current medications: ?Metformin '500mg'$  XR two tablets twice daily Appropriate, Effective, Query Safe, ?-Medications previously tried: none noted  ?-Current home glucose readings ?fasting glucose: 70s-80s ?post prandial glucose: unknonw ?-Reports hypoglycemic/hyperglycemic symptoms - not necessarily symptoms but says a lot of her glucose readings are in the 70s and 80s ?-Educated on A1c and blood sugar goals; ?Prevention and management of hypoglycemic episodes; ?Benefits of routine self-monitoring of blood sugar; ?-Counseled to check  feet daily and get yearly eye exams ?-Recommended to continue current medication ?I think we could potentially cut back to '1000mg'$  of metformin per day.  I believe that she may be having some near hypoglycemic episodes.  Most recent A1c is 5.5. ?Consider dose decrease to Metformin XR '500mg'$  twice daily. ?Recheck A1c in 6 months. ? ?Depression/Anxiety (Goal: Reduce  symptoms) ?-Not ideally controlled ?-Current treatment: ?Escitalopram '20mg'$  Appropriate, Query effective, ,  ?Bupropion '75mg'$  one-half tablet twice daily Appropriate, Effective, Safe, Accessible ?-Medications previously tried/failed: none noted ?-PHQ9:  ? ?  04/23/2021  ?  3:02 PM 12/14/2020  ?  1:38 PM 07/28/2020  ? 10:52 AM  ?PHQ9 SCORE ONLY  ?PHQ-9 Total Score 0 1 0  ? ?-GAD7:  ? ?  06/28/2017  ? 10:17 AM 11/28/2016  ?  3:29 PM  ?GAD 7 : Generalized Anxiety Score  ?Nervous, Anxious, on Edge 0 1  ?Control/stop worrying 0 0  ?Worry too much - different things 0 1  ?Trouble relaxing 0 1  ?Restless 0 1  ?Easily annoyed or irritable 0 0  ?Afraid - awful might happen 0 0  ?Total GAD 7 Score 0 4  ?Anxiety Difficulty Not difficult at all Not difficult at all  ? ?After discussion and med review, it was discovered she did not have her Lexapro home and had not been taking for what appears to be 3 months.  She reports increased irritability and anger spells, ?-Educated on Benefits of medication for symptom control ?-Recommended she resume Lexapro, hopefully this will help her current symptoms. ?Prescription called in for '20mg'$  daily, she could start with 1/2 tab daily for the first 4 weeks to taper back up to old treatment dose. ? ?Hypothryoidism (Goal: Maintain TSH) ?-Controlled ?-Current treatment  ?Levothyroxine 45mg daily Appropriate, Effective, Safe, Accessible ?-Medications previously tried: none noted  ?-Recommended to continue current medication ?Takes appropriately, TSH is WNL. ? ?Patient Goals/Self-Care Activities ?Patient will:  ?- take medications as prescribed as evidenced by patient report and record review ?focus on medication adherence by pill counts ?check glucose daily, document, and provide at future appointments ? ?Follow Up Plan: The care management team will reach out to the patient again over the next 6 months days.  ? ?  ? ? ?Ms. HZellnerwas given information about Chronic Care Management services today  including:  ?CCM service includes personalized support from designated clinical staff supervised by her physician, including individualized plan of care and coordination with other care providers ?24/7 contact phone numbers for assistance for urgent and routine care needs. ?Standard insurance, coinsurance, copays and deductibles apply for chronic care management only during months in which we provide at least 20 minutes of these services. Most insurances cover these services at 100%, however patients may be responsible for any copay, coinsurance and/or deductible if applicable. This service may help you avoid the need for more expensive face-to-face services. ?Only one practitioner may furnish and bill the service in a calendar month. ?The patient may stop CCM services at any time (effective at the end of the month) by phone call to the office staff. ? ?Patient agreed to services and verbal consent obtained.  ? ?The patient verbalized understanding of instructions, educational materials, and care plan provided today and agreed to receive a mailed copy of patient instructions, educational materials, and care plan.  ?Telephone follow up appointment with pharmacy team member scheduled for: 6 months ? ?CEdythe Clarity [facility] ?CBeverly Milch PharmD ?Clinical Pharmacist  ?[facility]?[phone]  [phone] ? ?

## 2021-06-24 ENCOUNTER — Other Ambulatory Visit: Payer: Self-pay

## 2021-06-24 ENCOUNTER — Ambulatory Visit (INDEPENDENT_AMBULATORY_CARE_PROVIDER_SITE_OTHER): Payer: Medicare Other | Admitting: Family Medicine

## 2021-06-24 ENCOUNTER — Encounter: Payer: Self-pay | Admitting: Family Medicine

## 2021-06-24 VITALS — BP 130/70 | HR 72 | Temp 97.3°F | Ht 66.0 in | Wt 210.4 lb

## 2021-06-24 DIAGNOSIS — E119 Type 2 diabetes mellitus without complications: Secondary | ICD-10-CM | POA: Diagnosis not present

## 2021-06-24 DIAGNOSIS — E1169 Type 2 diabetes mellitus with other specified complication: Secondary | ICD-10-CM

## 2021-06-24 DIAGNOSIS — E785 Hyperlipidemia, unspecified: Secondary | ICD-10-CM

## 2021-06-24 DIAGNOSIS — F1021 Alcohol dependence, in remission: Secondary | ICD-10-CM | POA: Diagnosis not present

## 2021-06-24 DIAGNOSIS — Z23 Encounter for immunization: Secondary | ICD-10-CM

## 2021-06-24 DIAGNOSIS — K700 Alcoholic fatty liver: Secondary | ICD-10-CM

## 2021-06-24 DIAGNOSIS — Z79899 Other long term (current) drug therapy: Secondary | ICD-10-CM

## 2021-06-24 DIAGNOSIS — E039 Hypothyroidism, unspecified: Secondary | ICD-10-CM

## 2021-06-24 DIAGNOSIS — F325 Major depressive disorder, single episode, in full remission: Secondary | ICD-10-CM

## 2021-06-24 DIAGNOSIS — I10 Essential (primary) hypertension: Secondary | ICD-10-CM

## 2021-06-24 MED ORDER — METFORMIN HCL ER 500 MG PO TB24: 500.0000 mg | ORAL_TABLET | Freq: Two times a day (BID) | ORAL | 3 refills | Status: DC

## 2021-06-24 NOTE — Progress Notes (Signed)
?Phone [phone] ?In person visit ?  ?Subjective:  ? ?[name] [name] is a 66 y.o. year old very pleasant female patient who presents for/with See problem oriented charting ?Chief Complaint  ?Patient presents with  ? Follow-up  ? ? ?Past Medical History-  ?Patient Active Problem List  ? Diagnosis Date Noted  ? Solitary pulmonary nodule 06/08/2016  ?  Priority: High  ? Alcoholic fatty liver 47/65/4650  ?  Priority: High  ? Type II diabetes mellitus, well controlled ([place]) 06/23/2010  ?  Priority: High  ? Alcohol use disorder, severe, in sustained remission ([place]) 06/23/2010  ?  Priority: High  ? Chronic depression 06/23/2010  ?  Priority: High  ? Migraine 07/28/2016  ?  Priority: Medium   ? Hypothyroid 07/21/2010  ?  Priority: Medium   ? Essential hypertension 06/23/2010  ?  Priority: Medium   ? Hyperlipidemia associated with type 2 diabetes mellitus ([place]) 06/23/2010  ?  Priority: Medium   ? S/P revision of total knee, left 08/15/2019  ?  Priority: Low  ? Excessive daytime sleepiness 09/07/2016  ?  Priority: Low  ? S/P knee replacement 05/05/2015  ?  Priority: Low  ? Former smoker 07/10/2014  ?  Priority: Low  ? Osteoarthritis 05/16/2014  ?  Priority: Low  ? Chronic post-traumatic stress disorder (PTSD) 05/02/2014  ?  Priority: Low  ? Sleep disorder 05/02/2014  ?  Priority: Low  ? Family history of alcoholism 05/02/2014  ?  Priority: Low  ? Macrocytosis 04/19/2014  ?  Priority: Low  ? Fatigue 08/15/2013  ?  Priority: Low  ? Thyroid nodule 09/17/2012  ?  Priority: Low  ? History of colonic polyps 04/12/2010  ?  Priority: Low  ? Major depressive disorder with single episode, in full remission ([place]) 06/24/2021  ? S/P cervical spinal fusion 01/18/2021  ? Osteoarthritis of ankle, right 01/29/2018  ? Sleep related headaches 09/07/2016  ? Nightmares REM-sleep type 09/07/2016  ? RLS (restless legs syndrome) 09/07/2016  ? Snoring 09/07/2016  ? Shortness of breath 06/07/2014  ? ? ?Medications- reviewed and updated ?Current  Outpatient Medications  ?Medication Sig Dispense Refill  ? aspirin EC 81 MG tablet Take 81 mg by mouth daily. Swallow whole.    ? Baclofen 5 MG TABS Take 1 tablet by mouth 3 (three) times daily as needed.    ? benazepril (LOTENSIN) 10 MG tablet Take 1 tablet (10 mg total) by mouth daily. 90 tablet 3  ? Blood Glucose Monitoring Suppl (ONETOUCH VERIO) w/Device KIT Use to test blood sugar daily 1 kit 0  ? buPROPion (WELLBUTRIN) 75 MG tablet Take 0.5 tablets (37.5 mg total) by mouth 2 (two) times daily. 90 tablet 3  ? Cholecalciferol (VITAMIN D HIGH POTENCY) 25 MCG (1000 UT) capsule Take 1,000 Units by mouth daily.    ? CRANBERRY PO Take 1 tablet by mouth daily.    ? dexamethasone (DECADRON) 4 MG tablet dexamethasone 4 mg tablet ? TAKE 1 TABLET BY MOUTH TWICE DAILY    ? Diclofenac Sodium 3 % GEL diclofenac 3 % topical gel ? APPLY TOPICALLY TWICE DAILY TO LESION AREAS    ? escitalopram (LEXAPRO) 20 MG tablet Take 1 tablet (20 mg total) by mouth daily. 90 tablet 3  ? flurbiprofen (ANSAID) 100 MG tablet Take 1 tablet (100 mg total) by mouth 2 (two) times daily as needed (for headaches). 60 tablet 2  ? glucose blood (ONETOUCH VERIO) test strip USE TO CHECK BLOOD SUGAR DAILY 100 each 12  ?  levothyroxine (SYNTHROID) 75 MCG tablet TAKE ONE TABLET BY MOUTH DAILY 90 tablet 3  ? methocarbamol (ROBAXIN) 500 MG tablet Take 500 mg by mouth every 8 (eight) hours as needed for muscle spasms.    ? omeprazole (PRILOSEC) 40 MG capsule Take 1 capsule (40 mg total) by mouth daily. 90 capsule 3  ? OneTouch Delica Lancets 40N MISC Use to test blood sugar daily 100 each 5  ? simvastatin (ZOCOR) 20 MG tablet TAKE ONE TABLET BY MOUTH EVERY NIGHT AT BEDTIME 90 tablet 3  ? tiZANidine (ZANAFLEX) 4 MG capsule Take 1 capsule (4 mg total) by mouth 3 (three) times daily as needed for muscle spasms. 40 capsule 0  ? traZODone (DESYREL) 50 MG tablet Take 1 tablet (50 mg total) by mouth at bedtime. 90 tablet 3  ? metFORMIN (GLUCOPHAGE-XR) 500 MG 24 hr  tablet Take 1 tablet (500 mg total) by mouth in the morning and at bedtime. 180 tablet 3  ? ?No current facility-administered medications for this visit.  ? ?  ?Objective:  ?BP 130/70   Pulse 72   Temp (!) 97.3 ?F (36.3 ?C) (Temporal)   Ht '5\' 6"'  (1.676 m)   Wt 210 lb 6 oz (95.4 kg)   SpO2 97%   BMI 33.96 kg/m?  ?Gen: NAD, resting comfortably ?CV: RRR no murmurs rubs or gallops ?Lungs: CTAB no crackles, wheeze, rhonchi ?Ext: no edema ?Skin: warm, dry ? ?  ? ?Assessment and Plan  ? ? ?#HM -prevnar 20 today ? ?# Diabetes ?S: Medication: Metformin 1000 mg extended release twice daily ?-Previously discontinued glimepiride 4 mg ? CBGs-occasional headaches- wonders if related to lower sugars- not checking sugar ?Exercise and diet- doing some limited exercise- walking, continued gradual weight loss ?Lab Results  ?Component Value Date  ? HGBA1C 5.5 06/21/2021  ? HGBA1C 5.5 12/10/2020  ? HGBA1C 6.0 07/21/2020  ? A/P: diabetes is controlled perhaps overcontrolled-we are going to reduce metformin to 500 mg twice daily extended release ? ?#hypertension ?S: medication: Benazepril 10 mg daily-reduced at last visit from 20 mg  ?BP Readings from Last 3 Encounters:  ?06/24/21 130/70  ?01/19/21 (!) 167/75  ?01/11/21 119/63  ?A/P:  Controlled. Continue current medications.   ?  ?#hyperlipidemia ?S: Medication:Simvastatin 20 mg at bedtime ?Lab Results  ?Component Value Date  ? CHOL 122 06/21/2021  ? HDL 55.10 06/21/2021  ? [place] 53 06/21/2021  ? LDLDIRECT 158.6 02/26/2014  ? TRIG 69.0 06/21/2021  ? CHOLHDL 2 06/21/2021  ? A/P: Excellent control-continue current medication-weight loss very helpful  ?  ?# Depression ?S: Medication: Lexapro 20 mg daily in the past (has been off of this medicine for some time unintentionally), Wellbutrin 75 mg daily (last visit was actually having some irritability and we wondered if it could be related to Wellbutrin and plan had been to wean-she reports decided to stay on this) ?- Trazodone 50 mg  at bedtime nightly ?- Use tizanidine 4 mg for muscle spasms at night and methocarbamol during the day ?-No suicidal ideation  ?-Even though depression is well controlled patient has noted increasing irritability-mention that CCM visit yesterday ? ?  06/24/2021  ?  1:25 PM 04/23/2021  ?  3:02 PM 12/14/2020  ?  1:38 PM  ?Depression screen PHQ 2/9  ?Decreased Interest 0 0 0  ?Down, Depressed, Hopeless 0 0 0  ?PHQ - 2 Score 0 0 0  ?Altered sleeping 0  0  ?Tired, decreased energy 0  0  ?Change in appetite 0  0  ?Feeling bad or failure about yourself  0  1  ?Trouble concentrating 0  0  ?Moving slowly or fidgety/restless 0  0  ?Suicidal thoughts 0  0  ?PHQ-9 Score 0  1  ?Difficult doing work/chores Not difficult at all  Not difficult at all  ?A/P: Depression controlled but some irritability off lexapro- wants to restart and this was sent in yesterday- she will pick up and asked her to let us know how she is doing in 6 weeks or seek care immediately if any thoughts of self harm  ? ?#hypothyroidism ?S: compliant On thyroid medication-levothyroxine 75 mcg ?Lab Results  ?Component Value Date  ? TSH 1.65 06/21/2021  ? A/P:  Controlled. Continue current medications.   ? ?#Alcoholic fatty liver-LFTs have continued to be stable and remains off alcohol-continue to congratulate her  ?-sustained remission from alcohol! ?-prior macrocytosis resolved off alcohol  ? ?  Latest Ref Rng & Units 06/21/2021  ?  9:46 AM 12/10/2020  ? 11:05 AM 04/08/2020  ?  3:17 PM  ?Hepatic Function  ?Total Protein 6.0 - 8.3 g/dL 5.4   5.5   6.5    ?Albumin 3.5 - 5.2 g/dL 3.5    4.0    ?AST 0 - 37 U/L '13   12   16    ' ?ALT 0 - 35 U/L '10   9   8    ' ?Alk Phosphatase 39 - 117 U/L 52    77    ?Total Bilirubin 0.2 - 1.2 mg/dL 0.9   0.9   1.1    ?  ? ?#Lung cancer screening program-continues to be followed by lung cancer screening program and has visit in November  ? ?Recommended follow up: Return in about 6 months (around 12/25/2021) for followup or sooner if  needed.Schedule b4 you leave. ?Future Appointments  ?Date Time Provider [place]  ?12/27/2021  3:00 PM [facility] CCM PHARMACIST [facility]  ?05/06/2022  1:00 PM [facility] HEALTH COACH [facility]  ? ?Lab/Order associ

## 2021-06-24 NOTE — Patient Instructions (Addendum)
diabetes is controlled perhaps overcontrolled-we are going to reduce metformin to 500 mg twice daily extended release ? ?Depression controlled but some irritability off lexapro- wants to restart and this was sent in yesterday- she will pick up and asked her to let us know how she is doing in 6 weeks or seek care immediately if any thoughts of self harm  ? ? ?#prevnar 20 today ? ?Recommended follow up: Return in about 6 months (around 12/25/2021) for followup or sooner if needed.Schedule b4 you leave. ?

## 2021-07-01 ENCOUNTER — Ambulatory Visit (INDEPENDENT_AMBULATORY_CARE_PROVIDER_SITE_OTHER): Payer: Medicare Other | Admitting: Physician Assistant

## 2021-07-01 ENCOUNTER — Encounter: Payer: Self-pay | Admitting: Physician Assistant

## 2021-07-01 VITALS — BP 100/67 | HR 58 | Temp 97.5°F | Ht 66.0 in | Wt 214.0 lb

## 2021-07-01 DIAGNOSIS — N3001 Acute cystitis with hematuria: Secondary | ICD-10-CM | POA: Diagnosis not present

## 2021-07-01 LAB — POCT URINALYSIS DIPSTICK
Bilirubin, UA: NEGATIVE
Blood, UA: POSITIVE
Glucose, UA: NEGATIVE
Ketones, UA: NEGATIVE
Nitrite, UA: NEGATIVE
Protein, UA: POSITIVE — AB
Spec Grav, UA: 1.025 (ref 1.010–1.025)
Urobilinogen, UA: 2 E.U./dL — AB
pH, UA: 6 (ref 5.0–8.0)

## 2021-07-01 MED ORDER — CEPHALEXIN 500 MG PO CAPS: 500.0000 mg | ORAL_CAPSULE | Freq: Three times a day (TID) | ORAL | 0 refills | Status: AC

## 2021-07-01 NOTE — Progress Notes (Signed)
? ?Subjective:  ? ? Patient ID: [name] [name], female    DOB: [birth year], 66 y.o.   MRN: [id number] ? ?Chief Complaint  ?Patient presents with  ? Dysuria  ?  With cloudiness. Starting almost 2 weeks ago. She states that she has been drinking cranberry juice, and has helped some. She states when it first started she had some bleeding when wiping.   ? Urinary Urgency  ? ? ?Dysuria  ? ?Patient is in today for urinary symptoms x 2 weeks. Usually takes cran juice daily, which has helped reduce recurrence. Recently went out of town and started with symptoms later - tends to hold urine longer. Dysuria and urgency.  ?No pain or pressure. ?Hematuria in the beginning, none now. ?No nausea / vomiting / fever.  ?No hx hospitalizations for pyelonephritis.   ? ?Past Medical History:  ?Diagnosis Date  ? Acute meniscal tear of knee   ? Left knee  ? Alcohol problem drinking   ? rehab  ? Anemia   ? menstrual related  ? Anxiety   ? Arthritis   ? right ankle-neuropathy  ? Colon polyps   ? Depression   ? DM (diabetes mellitus), type 2 ([place]) 12/2009  ? type 2  ? Dyspnea   ? Evalluate for OSA (obstructive sleep apnea) 08/15/2013  ? No OSA on sleep study but may be underestimated.-never used cpap    ? GERD (gastroesophageal reflux disease)   ? not currently taking.  ? Hyperlipidemia   ? Hypertension   ? Hypothyroidism   ? Liver disease   ? Neuromuscular disorder ([place])   ? neropathy bilateral feet.  ? Thyroid disease   ? ? ?Past Surgical History:  ?Procedure Laterality Date  ? ADENOIDECTOMY    ? ANTERIOR CERVICAL DECOMP/DISCECTOMY FUSION N/A 01/18/2021  ? Procedure: Cervical five-six Cervical six-seven Anterior Cervical Decompression/ discectomy Fusion;  Surgeon: Eustace Moore, MD;  Location: [facility];  Service: Neurosurgery;  Laterality: N/A;  ? BREAST SURGERY Right   ? lumpectomy-benign  ? [place]  ? 1 time  ? CHOLECYSTECTOMY    ? HAMMER TOE SURGERY    ? Dr. Doran [facility]- feb 2022  ? HAMMERTOE RECONSTRUCTION WITH WEIL  OSTEOTOMY Bilateral 04/02/2020  ? Procedure: Bilateral 2-3 Weil and hammertoe corrections;  Surgeon: Wylene Simmer, MD;  Location: [facility];  Service: Orthopedics;  Laterality: Bilateral;  ? INNER EAR SURGERY    ? left ear had tubes put in and removed, scar tissue built up/ loss of hearing in left ear for over a year  ? MASS EXCISION Left 12/16/2013  ? Procedure: EXCISION LEFT  DISTAL THIGH MASS;  Surgeon: Mauri Pole, MD;  Location: [facility];  Service: Orthopedics;  Laterality: Left;  ? right ankle surgery     ? x 2- no retained hardware  ? right rotator cuff surgery     ? left side  ? TONSILLECTOMY    ? TOTAL KNEE ARTHROPLASTY  2009  ? right  ? TOTAL KNEE ARTHROPLASTY Left 05/05/2015  ? Procedure: LEFT TOTAL KNEE ARTHROPLASTY;  Surgeon: Paralee Cancel, MD;  Location: [facility];  Service: Orthopedics;  Laterality: Left;  ? TOTAL KNEE REVISION Right 12/16/2013  ? Procedure: RIGHT TOTAL KNEE REVISION POLY EXCHANGE;  Surgeon: Mauri Pole, MD;  Location: [facility];  Service: Orthopedics;  Laterality: Right;  ? TOTAL KNEE REVISION Left 08/15/2019  ? Procedure: LEFT KNEE REVISION;  Surgeon: Paralee Cancel, MD;  Location: [facility];  Service:  Orthopedics;  Laterality: Left;  2 hrs  ? TUBAL LIGATION  1987  ? ? ?Family History  ?Adopted: Yes  ? ? ?Social History  ? ?Tobacco Use  ? Smoking status: Former  ?  Packs/day: 1.50  ?  Years: 30.00  ?  Pack years: 45.00  ?  Types: Cigarettes  ?  Start date: 1969  ?  Quit date: 02/21/2009  ?  Years since quitting: 12.3  ? Smokeless tobacco: Never  ?Vaping Use  ? Vaping Use: Former  ? Substances: CBD  ?Substance Use Topics  ? Alcohol use: Not Currently  ? Drug use: Not Currently  ?  Types: Marijuana  ?  Comment: last time 3 days ago  ?  ? ?Allergies  ?Allergen Reactions  ? Adhesive [Tape] Other (See Comments)  ?  blisters  ? Nitrofurantoin   ?  Diffuse rash  ? Other Other (See Comments)  ?  (Neoprene)--causes blisters.   ? ? ?Review of Systems  ?Genitourinary:  Positive for  dysuria.  ?NEGATIVE UNLESS OTHERWISE INDICATED IN HPI ? ? ?   ?Objective:  ?  ? ?BP 100/67 (BP Location: Left Arm, Patient Position: Sitting, Cuff Size: Large)   Pulse (!) 58   Temp (!) 97.5 ?F (36.4 ?C) (Temporal)   Ht '5\' 6"'$  (1.676 m)   Wt 214 lb (97.1 kg)   SpO2 96%   BMI 34.54 kg/m?  ? ?Wt Readings from Last 3 Encounters:  ?07/01/21 214 lb (97.1 kg)  ?06/24/21 210 lb 6 oz (95.4 kg)  ?01/18/21 217 lb (98.4 kg)  ? ? ?BP Readings from Last 3 Encounters:  ?07/01/21 100/67  ?06/24/21 130/70  ?01/19/21 (!) 167/75  ?  ? ?Physical Exam ?Vitals and nursing note reviewed.  ?Constitutional:   ?   General: She is not in acute distress. ?   Appearance: Normal appearance. She is not ill-appearing.  ?HENT:  ?   Head: Normocephalic and atraumatic.  ?Cardiovascular:  ?   Rate and Rhythm: Normal rate and regular rhythm.  ?   Pulses: Normal pulses.  ?   Heart sounds: Normal heart sounds.  ?Pulmonary:  ?   Effort: Pulmonary effort is normal.  ?   Breath sounds: Normal breath sounds.  ?Abdominal:  ?   General: Abdomen is flat. Bowel sounds are normal.  ?   Palpations: Abdomen is soft.  ?   Tenderness: There is no right CVA tenderness or left CVA tenderness.  ?Skin: ?   General: Skin is warm and dry.  ?Neurological:  ?   General: No focal deficit present.  ?   Mental Status: She is alert.  ?Psychiatric:     ?   Mood and Affect: Mood normal.  ? ? ?   ?Assessment & Plan:  ? ?Problem List Items Addressed This Visit   ?None ?Visit Diagnoses   ? ? Acute cystitis with hematuria    -  Primary  ? Relevant Orders  ? POCT Urinalysis Dipstick (Completed)  ? Urine Culture  ? ?  ? ? ? ?Meds ordered this encounter  ?Medications  ? cephALEXin (KEFLEX) 500 MG capsule  ?  Sig: Take 1 capsule (500 mg total) by mouth 3 (three) times daily for 7 days.  ?  Dispense:  21 capsule  ?  Refill:  0  ?  Order Specific Question:   Supervising Provider  ?  Answer:   Marin Olp [[id number]]  ? ?1. Acute cystitis with hematuria ?Dysuria - U/A performed in  office today. Will send urine for culture. Treat with cephalexin, change antibiotic per sensitivity report if needed. Increase water intake. May take AZO for symptomatic relief. Recheck sooner if fever, severe back pain, vomiting, or other acutely worsening symptoms.  ? ? ? ?This note was prepared with assistance of Systems analyst. Occasional wrong-word or sound-a-like substitutions may have occurred due to the inherent limitations of voice recognition software. ? ? ?Christne Platts M Posey Petrik, PA-C ?

## 2021-07-01 NOTE — Patient Instructions (Signed)
Good to see you today! ?Urinalysis looks consistent with infection. ?Start on cephalexin. ?Push fluids. ?We'll culture urine and call with results.  ?

## 2021-07-03 LAB — URINE CULTURE
MICRO NUMBER:: 13383034
SPECIMEN QUALITY:: ADEQUATE

## 2021-07-12 ENCOUNTER — Encounter: Payer: Self-pay | Admitting: Family Medicine

## 2021-07-13 ENCOUNTER — Other Ambulatory Visit: Payer: Self-pay

## 2021-07-13 DIAGNOSIS — E039 Hypothyroidism, unspecified: Secondary | ICD-10-CM

## 2021-07-13 MED ORDER — LEVOTHYROXINE SODIUM 75 MCG PO TABS: 75.0000 ug | ORAL_TABLET | Freq: Every day | ORAL | 3 refills | Status: DC

## 2021-07-21 DIAGNOSIS — E1169 Type 2 diabetes mellitus with other specified complication: Secondary | ICD-10-CM | POA: Diagnosis not present

## 2021-07-21 DIAGNOSIS — I10 Essential (primary) hypertension: Secondary | ICD-10-CM | POA: Diagnosis not present

## 2021-07-21 DIAGNOSIS — Z87891 Personal history of nicotine dependence: Secondary | ICD-10-CM

## 2021-07-21 DIAGNOSIS — F32A Depression, unspecified: Secondary | ICD-10-CM

## 2021-07-21 DIAGNOSIS — Z7984 Long term (current) use of oral hypoglycemic drugs: Secondary | ICD-10-CM

## 2021-07-21 DIAGNOSIS — E785 Hyperlipidemia, unspecified: Secondary | ICD-10-CM | POA: Diagnosis not present

## 2021-07-21 DIAGNOSIS — E039 Hypothyroidism, unspecified: Secondary | ICD-10-CM | POA: Diagnosis not present

## 2021-07-27 IMAGING — MG MM DIGITAL SCREENING BILAT W/ TOMO AND CAD
8 series · 8 of 24 positions shown · non-contrast
Comparison: Previous exam(s).

CLINICAL DATA: Screening.

EXAM:
DIGITAL SCREENING BILATERAL MAMMOGRAM WITH TOMOSYNTHESIS AND CAD
TECHNIQUE: Bilateral screening digital craniocaudal and mediolateral oblique
mammograms were obtained. Bilateral screening digital breast
tomosynthesis was performed. The images were evaluated with
computer-aided detection.

[L CC synth-2D]
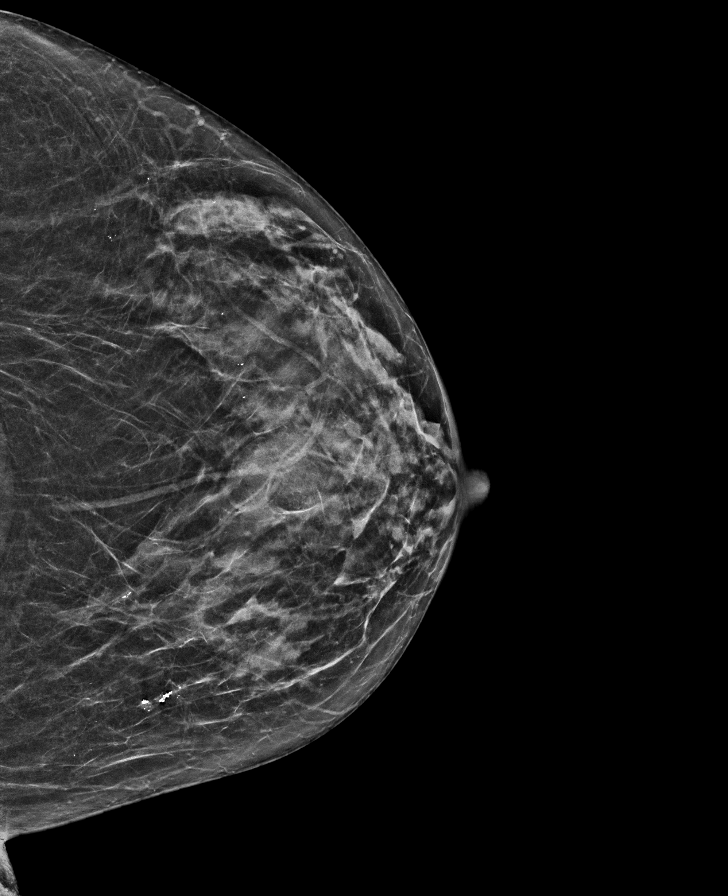

[L MLO synth-2D]
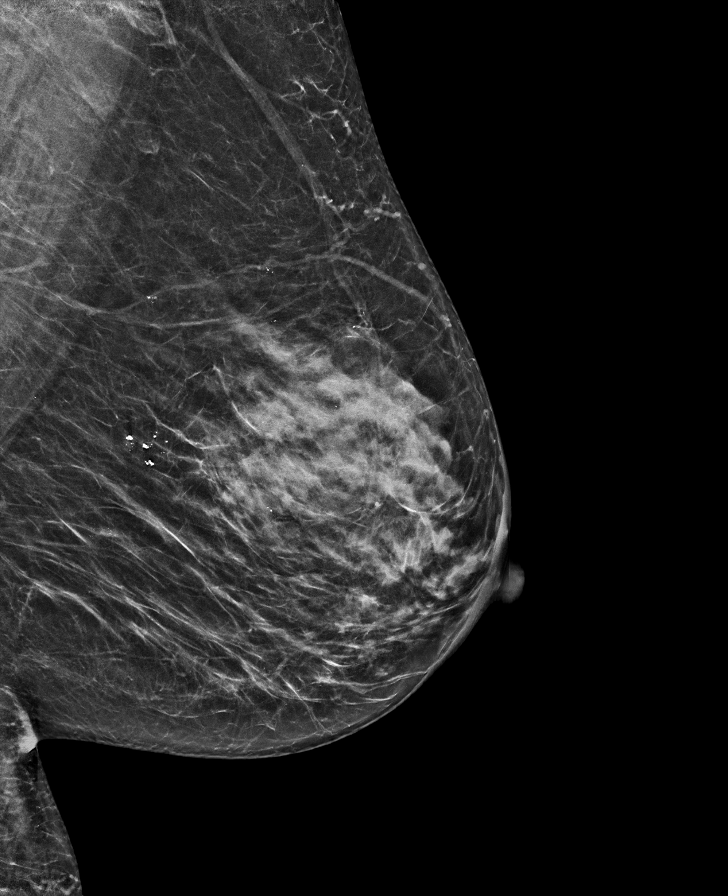

[R MLO synth-2D]
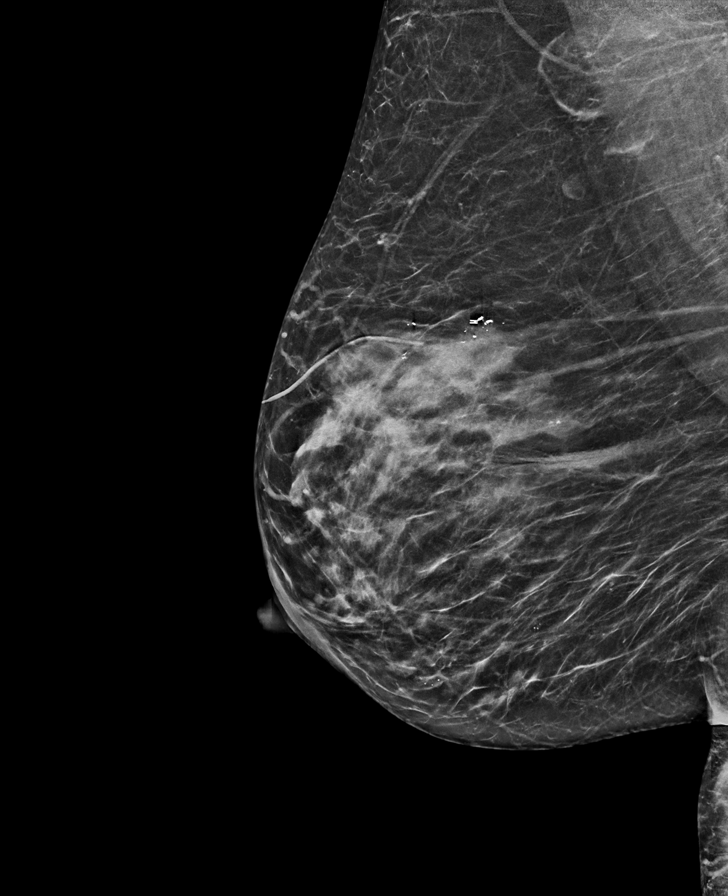

[R CC synth-2D]
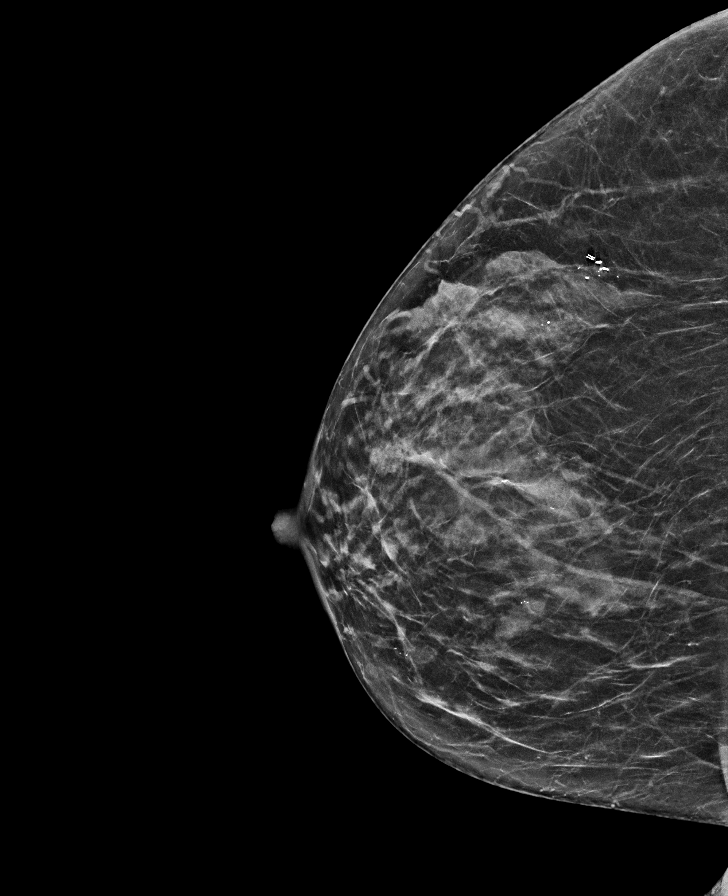

[L CC tomo · tomo slice 29/56.0]
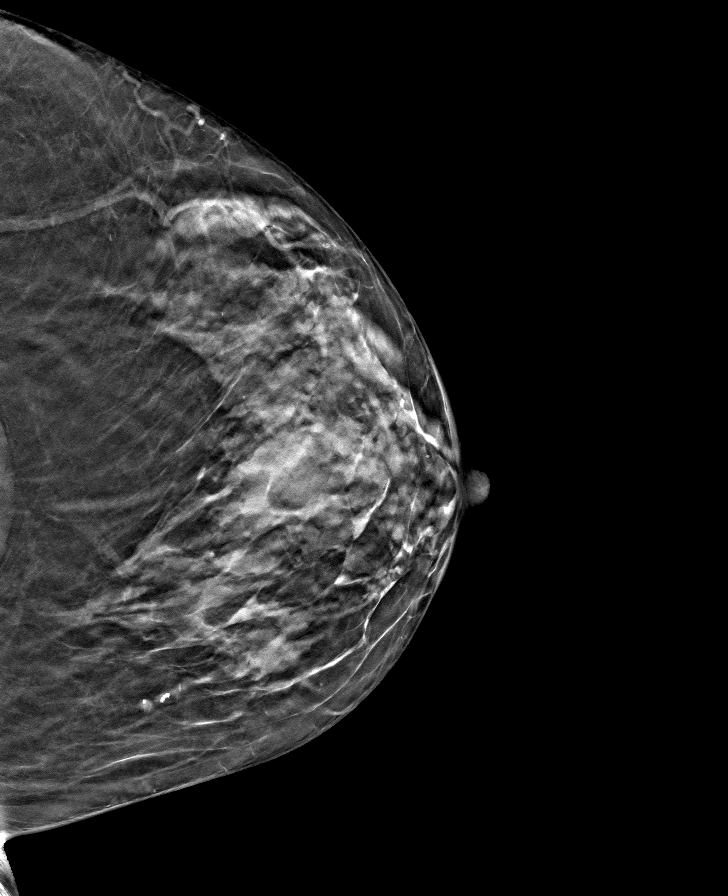

[R CC tomo · tomo slice 27/53.0]
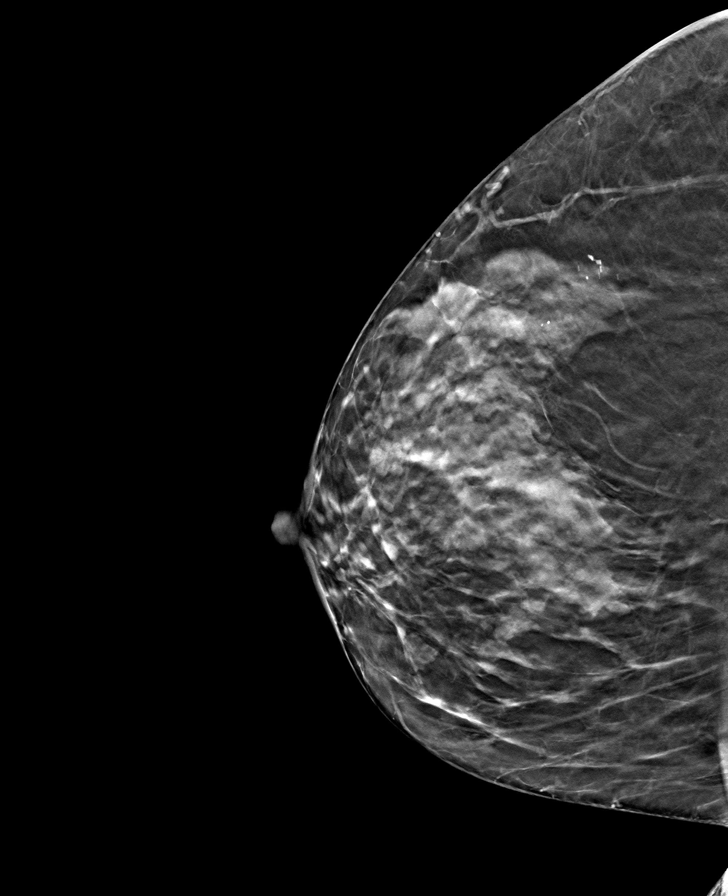

[R MLO tomo · tomo slice 31/60.0]
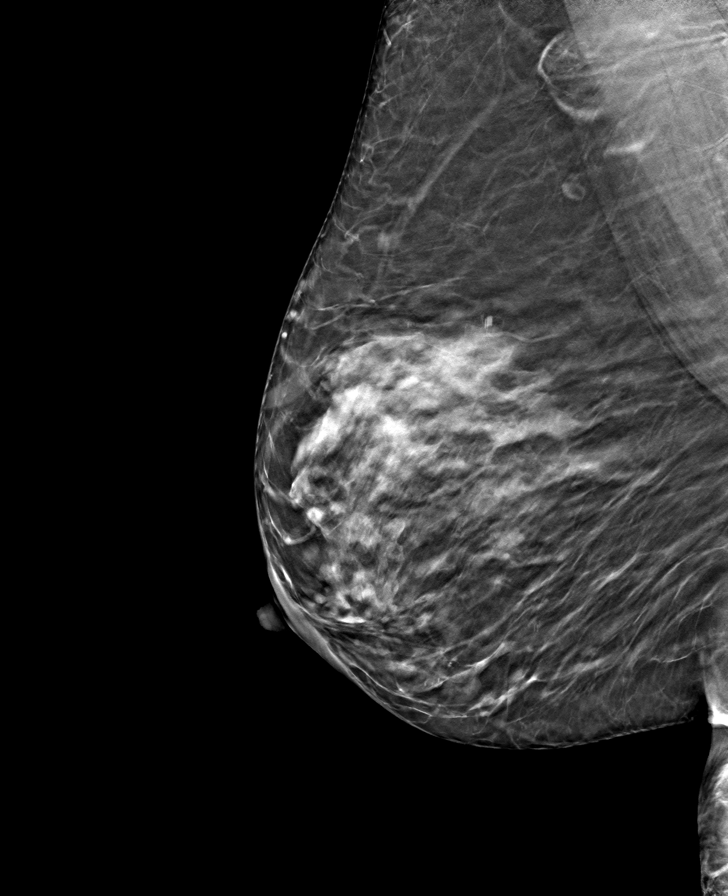

[L MLO tomo · tomo slice 31/62.0]
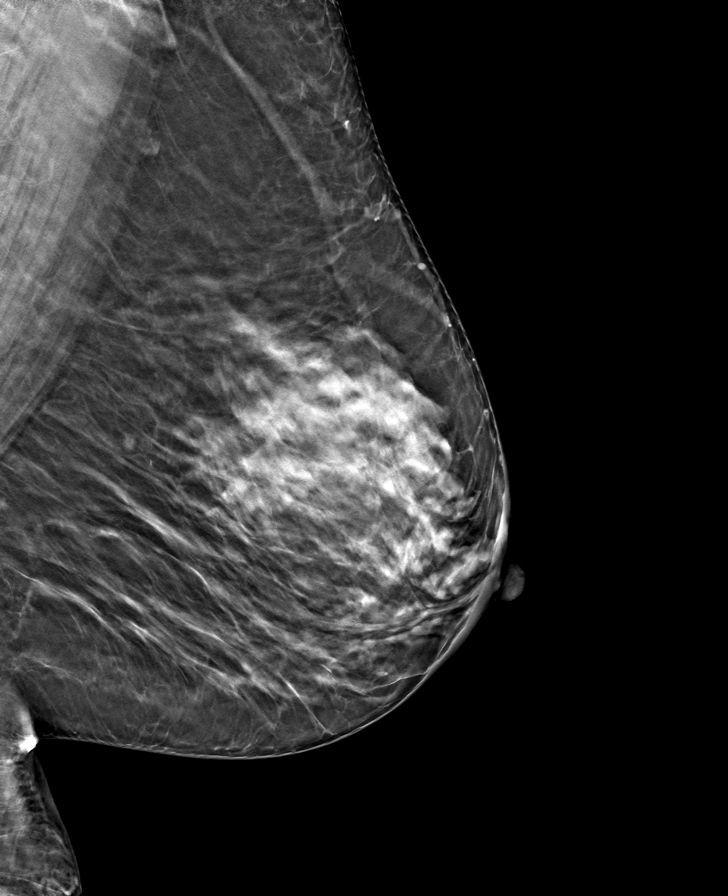

[8 of 24 positions shown; findings below may reference images not displayed]

ACR Breast Density Category c: The breast tissue is heterogeneously
dense, which may obscure small masses.
FINDINGS: There are no findings suspicious for malignancy. The images were
evaluated with computer-aided detection.
IMPRESSION: No mammographic evidence of malignancy. A result letter of this
screening mammogram will be mailed directly to the patient.

RECOMMENDATION:
Screening mammogram in one year. (Code:T4-5-GWO)

BI-RADS CATEGORY  1: Negative.

## 2021-08-29 ENCOUNTER — Other Ambulatory Visit: Payer: Self-pay | Admitting: Family Medicine

## 2021-08-30 ENCOUNTER — Encounter: Payer: Self-pay | Admitting: Family Medicine

## 2021-08-30 MED ORDER — TIZANIDINE HCL 4 MG PO CAPS: 4.0000 mg | ORAL_CAPSULE | Freq: Three times a day (TID) | ORAL | 0 refills | Status: DC | PRN

## 2021-08-31 ENCOUNTER — Other Ambulatory Visit: Payer: Self-pay

## 2021-08-31 MED ORDER — TIZANIDINE HCL 4 MG PO CAPS: 4.0000 mg | ORAL_CAPSULE | Freq: Three times a day (TID) | ORAL | 1 refills | Status: DC | PRN

## 2021-09-01 ENCOUNTER — Other Ambulatory Visit: Payer: Self-pay | Admitting: Family Medicine

## 2021-09-02 ENCOUNTER — Other Ambulatory Visit: Payer: Self-pay

## 2021-09-02 MED ORDER — TIZANIDINE HCL 4 MG PO TABS
4.0000 mg | ORAL_TABLET | Freq: Three times a day (TID) | ORAL | 1 refills | Status: AC | PRN
Start: 2021-09-02 — End: ?

## 2021-09-08 ENCOUNTER — Telehealth: Payer: Self-pay | Admitting: Family Medicine

## 2021-09-08 NOTE — Telephone Encounter (Signed)
[name] [name] with [facility] requests to be called at ph# [phone] regarding Tizanidine '4mg'$ 

## 2021-09-10 NOTE — Telephone Encounter (Signed)
Called and spoke with [facility] and verified Tizanidine tablets sent in for pt do not require PA.

## 2021-09-29 ENCOUNTER — Encounter (INDEPENDENT_AMBULATORY_CARE_PROVIDER_SITE_OTHER): Payer: Self-pay

## 2021-10-27 ENCOUNTER — Ambulatory Visit: Payer: Medicare Other | Admitting: Cardiology

## 2021-11-18 ENCOUNTER — Encounter: Payer: Self-pay | Admitting: Family Medicine

## 2021-11-18 ENCOUNTER — Encounter: Payer: Self-pay | Admitting: Cardiology

## 2021-12-01 ENCOUNTER — Encounter: Payer: Self-pay | Admitting: Family Medicine

## 2021-12-13 ENCOUNTER — Encounter: Payer: Self-pay | Admitting: Family Medicine

## 2021-12-13 ENCOUNTER — Ambulatory Visit (INDEPENDENT_AMBULATORY_CARE_PROVIDER_SITE_OTHER): Payer: Medicare Other | Admitting: Family Medicine

## 2021-12-13 VITALS — BP 122/74 | HR 78 | Temp 98.0°F | Ht 66.0 in | Wt 202.0 lb

## 2021-12-13 DIAGNOSIS — E039 Hypothyroidism, unspecified: Secondary | ICD-10-CM

## 2021-12-13 DIAGNOSIS — E1165 Type 2 diabetes mellitus with hyperglycemia: Secondary | ICD-10-CM | POA: Diagnosis not present

## 2021-12-13 DIAGNOSIS — I5032 Chronic diastolic (congestive) heart failure: Secondary | ICD-10-CM | POA: Diagnosis not present

## 2021-12-13 DIAGNOSIS — Z79899 Other long term (current) drug therapy: Secondary | ICD-10-CM

## 2021-12-13 DIAGNOSIS — I252 Old myocardial infarction: Secondary | ICD-10-CM

## 2021-12-13 DIAGNOSIS — H9213 Otorrhea, bilateral: Secondary | ICD-10-CM

## 2021-12-13 DIAGNOSIS — I10 Essential (primary) hypertension: Secondary | ICD-10-CM | POA: Diagnosis not present

## 2021-12-13 DIAGNOSIS — I5030 Unspecified diastolic (congestive) heart failure: Secondary | ICD-10-CM | POA: Insufficient documentation

## 2021-12-13 DIAGNOSIS — H9203 Otalgia, bilateral: Secondary | ICD-10-CM

## 2021-12-13 NOTE — Progress Notes (Addendum)
Phone [phone] In person visit   Subjective:   [name] [name] is a 66 y.o. year old very pleasant female patient who presents for/with See problem oriented charting Chief Complaint  Patient presents with   Hospitalization Follow-up    Pt wants to discuss medication and possibly changing over to a endocrinologist    Past Medical History-  Patient Active Problem List   Diagnosis Date Noted   (HFpEF) heart failure with preserved ejection fraction ([place]) 12/13/2021    Priority: High   History of non-ST elevation myocardial infarction (NSTEMI) 12/13/2021    Priority: High   Solitary pulmonary nodule 06/08/2016    Priority: High   Alcoholic fatty liver 57/84/6962    Priority: High   Type II diabetes mellitus, well controlled ([place]) 06/23/2010    Priority: High   Alcohol use disorder, severe, in sustained remission ([place]) 06/23/2010    Priority: High   Chronic depression 06/23/2010    Priority: High   Migraine 07/28/2016    Priority: Medium    Hypothyroid 07/21/2010    Priority: Medium    Essential hypertension 06/23/2010    Priority: Medium    Hyperlipidemia associated with type 2 diabetes mellitus ([place]) 06/23/2010    Priority: Medium    S/P revision of total knee, left 08/15/2019    Priority: Low   Excessive daytime sleepiness 09/07/2016    Priority: Low   S/P knee replacement 05/05/2015    Priority: Low   Former smoker 07/10/2014    Priority: Low   Osteoarthritis 05/16/2014    Priority: Low   Chronic post-traumatic stress disorder (PTSD) 05/02/2014    Priority: Low   Sleep disorder 05/02/2014    Priority: Low   Family history of alcoholism 05/02/2014    Priority: Low   Macrocytosis 04/19/2014    Priority: Low   Fatigue 08/15/2013    Priority: Low   Thyroid nodule 09/17/2012    Priority: Low   History of colonic polyps 04/12/2010    Priority: Low   Major depressive disorder with single episode, in full remission ([place]) 06/24/2021   S/P cervical spinal fusion  01/18/2021   Osteoarthritis of ankle, right 01/29/2018   Sleep related headaches 09/07/2016   Nightmares REM-sleep type 09/07/2016   RLS (restless legs syndrome) 09/07/2016   Snoring 09/07/2016   Shortness of breath 06/07/2014    Medications- reviewed and updated Current Outpatient Medications  Medication Sig Dispense Refill   Baclofen 5 MG TABS Take 1 tablet by mouth 3 (three) times daily as needed.     Blood Glucose Monitoring Suppl (ONETOUCH VERIO) w/Device KIT Use to test blood sugar daily 1 kit 0   bumetanide (BUMEX) 1 MG tablet Take 1 mg by mouth daily.     buPROPion (WELLBUTRIN) 75 MG tablet Take 0.5 tablets (37.5 mg total) by mouth 2 (two) times daily. 90 tablet 3   Cholecalciferol (VITAMIN D HIGH POTENCY) 25 MCG (1000 UT) capsule Take 1,000 Units by mouth daily.     CRANBERRY PO Take 1 tablet by mouth daily.     escitalopram (LEXAPRO) 20 MG tablet Take 1 tablet (20 mg total) by mouth daily. 90 tablet 3   flurbiprofen (ANSAID) 100 MG tablet Take 1 tablet (100 mg total) by mouth 2 (two) times daily as needed (for headaches). 60 tablet 2   glucose blood (ONETOUCH VERIO) test strip USE TO CHECK BLOOD SUGAR DAILY 100 each 12   levothyroxine (SYNTHROID) 75 MCG tablet Take 1 tablet (75 mcg total) by mouth daily. [place]  tablet 3   magnesium oxide (MAG-OX) 400 (240 Mg) MG tablet Take 1 tablet by mouth daily.     metFORMIN (GLUCOPHAGE-XR) 500 MG 24 hr tablet Take 1 tablet (500 mg total) by mouth in the morning and at bedtime. 180 tablet 3   methocarbamol (ROBAXIN) 500 MG tablet Take 500 mg by mouth every 8 (eight) hours as needed for muscle spasms.     omeprazole (PRILOSEC) 40 MG capsule Take 1 capsule (40 mg total) by mouth daily. 90 capsule 3   OneTouch Delica Lancets 25K MISC Use to test blood sugar daily 100 each 5   potassium chloride SA (KLOR-CON M) 20 MEQ tablet Take 20 mEq by mouth daily.     rosuvastatin (CRESTOR) 20 MG tablet Take 20 mg by mouth daily.     tiZANidine (ZANAFLEX) 4 MG  tablet Take 1 tablet (4 mg total) by mouth 3 (three) times daily as needed for muscle spasms. 270 tablet 1   traZODone (DESYREL) 50 MG tablet Take 1 tablet (50 mg total) by mouth at bedtime. 90 tablet 3   No current facility-administered medications for this visit.     Objective:  BP 122/74   Pulse 78   Temp 98 F (36.7 C) (Temporal)   Ht _0  (1.676 m)   Wt 202 lb (91.6 kg)   SpO2 97%   BMI 32.60 kg/m  Gen: NAD, resting comfortably TM membrane perforation in upper left corner of right ear CV: RRR stable murmur (no clear cause on echo) Lungs: CTAB no crackles, wheeze, rhonchi Abdomen: soft/nontender/nondistended/normal bowel sounds.  Ext: minimal edema Skin: warm, dry     Assessment and Plan   #Hospital follow-up for NSTEMI and heart failure with preserved ejection fraction with discharge October 05, 2021 S: Patient presented to outside hospital on 10/02/2021 with shortness of breath and chest pain thought likely related to NSTEMI/CHF/pericardial effusion as well as CHF with BNP as high as 2362. D dimer mildly elevated- She did have a CT angiogram on 10/02/2021 at [facility] which showed no pulmonary embolism, cardiomegaly, scattered small pulmonary nodules-noted and mentioned below.  Appears initial troponin was 0.075 trending up to 0.118 then trending back to 0.072 (believe this was a combination across both hospitals)- ultimately this was thought to be due to demand ischemia   - was transferred to [facility] and hospitalized 10/03/21-10/05/21- echo with grade II diastolic dysfunction (scanned in under media) and EF 55% with grade II diastolic dysfunction , stress test with possible perfusion defect so had catheterization 10/05/21 which showed minimal nonobstructive CAD (most significant lesion only at 10%). BNP was 1230 on Oct 05 2021- significantly trending down. Per patient report Felt sick ot stomach on lasix. Torsemide not tried per patient. She was placed On bumex 1 mg  daily for 90 tablets. There was some concern for pericarditis but infalmmatory markers not elevated and cardiology thought not likely. Chest pain worsened when lying down and seems to have improved with sitting up and leaning forward so discharged her to take colchicine if symptoms worsened in regards to chest pain.   Saw cardiologist outpatient per her report- had been placed on colchicine per patient- per notes states was provided this to use if pain worsened. Pain was still present but had improved. Reports still has pain sometimes even now despite omeprazole 40 mg A/P: Patient with recent NSTEMI due to demand ischemia from heart failure with preserved ejection fraction - Appears euvolemic on Bumex 1 mg daily-we will continue  for now - Reassuring catheterization-already has planned follow-up with cardiology for both issues -Minimal nonobstructive CAD but lipoprotein still not likely ideal -Still with some lingering chest pain despite reflux treatment-we will monitor through time of next visit but if worsens should return to care-no clear cause but exhaustive work-up including CT angiogram and cardiac work-up has been performed -Keep cardiology follow-up with Dr. Percival Spanish -Still patient remains alcohol free  #ED follow-up for hyperglycemia in patient with diabetes on 12/02/2021 S: [place] was out of state and seen at outside hospital.  Records available in epic.  Patient's diabetes has been well controlled on metformin-have actually been decreased to 500 mg twice a day previously.  She was out of town in an RV and had noted for several days and blood pressures in the 45 range.  She has felt slightly more fatigued recently but the sugar level surprised her.  She did not appear acutely ill and had blood work completed as well as given 1000 mL of normal saline over the course of an hour.  Blood sugar was elevated in the 400s but had no ketones in her blood or urine.  Creatinine was stable at 1.6.   Potassium low at 2.7it was not thought that she required admission-sugar trended down to 380 after fluids-metformin was increased to 1000 mg in the morning and 500 mg at night and then later after week 1000 mg twice daily with plan to add Jardiance as well-she was also given magnesium oxide and potassium chloride  Sugar down to 192 this morning on metformin 1000 mg in am and 1000 mg in PM and tolerating jardiance (staying well hydrated)  Lab Results  Component Value Date   HGBA1C 5.5 06/21/2021   HGBA1C 5.5 12/10/2020   HGBA1C 6.0 07/21/2020  A/P: Diabetes is poorly controlled based on elevated sugars up in the 400s range but has improved with increasing metformin 1000 mg twice daily and addition of Jardiance.  Vania Rea is a good choice as also reduces risk of poor control of heart failure - Update A1c but for now continue current medication - Patient requests referral to endocrine and I think that is appropriate -hypomagnesemia- on mag ox 47m daily for 14 days-update magnesium today -Hypokalemia- on 20 meq daily for 14 days-check potassium but may need ongoing replacement  #hypertension S: medication: benazepril stopped for unclear reasons-likely to give space for diuresis.  Is on Bumex 1 mg daily BP Readings from Last 3 Encounters:  12/13/21 122/74  07/01/21 100/67  06/24/21 130/70  A/P: Well-controlled with Bumex alone-continue current medication  #hypothyroidism S: compliant On thyroid medication- levothyroxine 75 mcg- ran out 2-3 days ago Lab Results  Component Value Date   TSH 1.65 06/21/2021  A/P: hopefully stable- update TSH today. Continue current meds for now most likely even if mildly off since has missed a few days of doses  #hyperlipidemia S: Medication: rosuvastatin 20 mg Lab Results  Component Value Date   CHOL 122 06/21/2021   HDL 55.10 06/21/2021   LDLCALC 53 06/21/2021   LDLDIRECT 158.6 02/26/2014   TRIG 69.0 06/21/2021   CHOLHDL 2 06/21/2021   A/P: Even  for nonobstructive CAD is very well controlled with LDL under 70-continue current medication  #Pulmonary nodule-on CT angiogram from August hospitalization ""Scattered small pulmonary nodules, juxtapleural nodules and subpleural nodular opacities. Post inflammatory changes are suspected. Follow-up screening lung CT in 6 months is recommended." -Already had a known solitary pulmonary nodule from at least back to 2018 -We will plan  on doing this in February or later-likely order at January visit  #Bilateral ear pain and discharge-patient states has been having trouble getting back into ENT-previously saw Dr. Shanon Payor referral was placed today  Recommended follow up: Return in about 14 weeks (around 03/21/2022) for followup or sooner if needed.Schedule b4 you leave. Future Appointments  Date Time Provider [place]  12/27/2021 11:40 AM Minus Breeding, MD [facility] None  12/27/2021  3:00 PM [facility] CCM PHARMACIST [facility]  03/21/2022  3:00 PM Marin Olp, MD [facility]  05/06/2022  1:00 PM [facility] HEALTH COACH [facility]    Lab/Order associations:   ICD-10-CM   1. Chronic heart failure with preserved ejection fraction (HCC)  I50.32     2. History of non-ST elevation myocardial infarction (NSTEMI)  I25.2     3. Essential hypertension  I10 Comprehensive metabolic panel    CBC with Differential/Platelet    4. Hypothyroidism, unspecified type  E03.9 TSH    5. Type 2 diabetes mellitus with hyperglycemia, without long-term current use of insulin (HCC)  E11.65 Microalbumin / creatinine urine ratio    HgB A1c    Ambulatory referral to Endocrinology    6. Hypomagnesemia  E83.42 Magnesium    7. High risk medication use  Z79.899 Vitamin B12    8. Ear pain, bilateral  H92.03 Ambulatory referral to ENT    9. Ear discharge, bilateral  H92.13 Ambulatory referral to ENT     No orders of the defined types were placed in this encounter.   Time Spent: 55 minutes of total  time (3:14 PM-3:59 PM, 9:58 PM-10:08 PM) was spent on the date of the encounter performing the following actions: chart review prior to seeing the patient including reviewing and summarizing multiple outside records through what she gave me and through PDF files under media, obtaining history, performing a medically necessary exam, counseling on the treatment plan and neck steps, placing orders, and documenting in our EHR.   Return precautions advised.  [name], MD

## 2021-12-13 NOTE — Patient Instructions (Addendum)
Please stop by lab before you go If you have mychart- we will send your results within 3 business days of [place] receiving them.  If you do not have mychart- we will call you about results within 5 business days of [place] receiving them.  *please also note that you will see labs on mychart as soon as they post. I will later go in and write notes on them- will say "notes from Dr. Yong Channel"   We will call you within two weeks about your referral to endocrine and ENT. If you do not hear within 2 weeks, give [place] a call.    No changes today unless labs lead [place] to make changes  Recommended follow up: Return in about 14 weeks (around 03/21/2022) for followup or sooner if needed.Schedule b4 you leave. -cancel lab visit on 12/22/21 and visit with me 12/28/21

## 2021-12-14 LAB — CBC WITH DIFFERENTIAL/PLATELET
Basophils Absolute: 0 10*3/uL (ref 0.0–0.1)
Basophils Relative: 0.8 % (ref 0.0–3.0)
Eosinophils Absolute: 0 10*3/uL (ref 0.0–0.7)
Eosinophils Relative: 0.8 % (ref 0.0–5.0)
HCT: 42.2 % (ref 36.0–46.0)
Hemoglobin: 14.5 g/dL (ref 12.0–15.0)
Lymphocytes Relative: 37.9 % (ref 12.0–46.0)
Lymphs Abs: 2 10*3/uL (ref 0.7–4.0)
MCHC: 34.2 g/dL (ref 30.0–36.0)
MCV: 90.2 fl (ref 78.0–100.0)
Monocytes Absolute: 0.4 10*3/uL (ref 0.1–1.0)
Monocytes Relative: 8.2 % (ref 3.0–12.0)
Neutro Abs: 2.8 10*3/uL (ref 1.4–7.7)
Neutrophils Relative %: 52.3 % (ref 43.0–77.0)
Platelets: 222 10*3/uL (ref 150.0–400.0)
RBC: 4.68 Mil/uL (ref 3.87–5.11)
RDW: 18.4 % — ABNORMAL HIGH (ref 11.5–15.5)
WBC: 5.3 10*3/uL (ref 4.0–10.5)

## 2021-12-14 LAB — COMPREHENSIVE METABOLIC PANEL
ALT: 17 U/L (ref 0–35)
AST: 29 U/L (ref 0–37)
Albumin: 4.2 g/dL (ref 3.5–5.2)
Alkaline Phosphatase: 75 U/L (ref 39–117)
BUN: 21 mg/dL (ref 6–23)
CO2: 27 mEq/L (ref 19–32)
Calcium: 10.8 mg/dL — ABNORMAL HIGH (ref 8.4–10.5)
Chloride: 98 mEq/L (ref 96–112)
Creatinine, Ser: 1.37 mg/dL — ABNORMAL HIGH (ref 0.40–1.20)
GFR: 40 mL/min — ABNORMAL LOW (ref >60.00)
Glucose, Bld: 163 mg/dL — ABNORMAL HIGH (ref 70–99)
Potassium: 3.7 mEq/L (ref 3.5–5.1)
Sodium: 137 mEq/L (ref 135–145)
Total Bilirubin: 1.7 mg/dL — ABNORMAL HIGH (ref 0.2–1.2)
Total Protein: 7.3 g/dL (ref 6.0–8.3)

## 2021-12-14 LAB — MAGNESIUM: Magnesium: 1.9 mg/dL (ref 1.5–2.5)

## 2021-12-14 LAB — TSH: TSH: 3.91 u[IU]/mL (ref 0.35–5.50)

## 2021-12-14 LAB — HEMOGLOBIN A1C: Hgb A1c MFr Bld: 9.8 % — ABNORMAL HIGH (ref 4.6–6.5)

## 2021-12-14 LAB — MICROALBUMIN / CREATININE URINE RATIO
Creatinine,U: 21.1 mg/dL
Microalb Creat Ratio: 4 mg/g (ref 0.0–30.0)
Microalb, Ur: 0.8 mg/dL (ref 0.0–1.9)

## 2021-12-14 LAB — VITAMIN B12: Vitamin B-12: 481 pg/mL (ref 211–911)

## 2021-12-15 ENCOUNTER — Other Ambulatory Visit: Payer: Self-pay

## 2021-12-15 ENCOUNTER — Encounter: Payer: Self-pay | Admitting: Family Medicine

## 2021-12-15 DIAGNOSIS — I10 Essential (primary) hypertension: Secondary | ICD-10-CM

## 2021-12-15 MED ORDER — POTASSIUM CHLORIDE CRYS ER 20 MEQ PO TBCR: 20.0000 meq | EXTENDED_RELEASE_TABLET | Freq: Every day | ORAL | 0 refills | Status: DC

## 2021-12-17 MED ORDER — EMPAGLIFLOZIN 10 MG PO TABS: 10.0000 mg | ORAL_TABLET | Freq: Every day | ORAL | 3 refills | Status: AC

## 2021-12-21 ENCOUNTER — Other Ambulatory Visit (INDEPENDENT_AMBULATORY_CARE_PROVIDER_SITE_OTHER): Payer: Medicare Other

## 2021-12-21 DIAGNOSIS — I10 Essential (primary) hypertension: Secondary | ICD-10-CM

## 2021-12-21 DIAGNOSIS — E119 Type 2 diabetes mellitus without complications: Secondary | ICD-10-CM

## 2021-12-21 LAB — CBC WITH DIFFERENTIAL/PLATELET
Basophils Absolute: 0 10*3/uL (ref 0.0–0.1)
Basophils Relative: 0.7 % (ref 0.0–3.0)
Eosinophils Absolute: 0.1 10*3/uL (ref 0.0–0.7)
Eosinophils Relative: 0.9 % (ref 0.0–5.0)
HCT: 40.6 % (ref 36.0–46.0)
Hemoglobin: 13.9 g/dL (ref 12.0–15.0)
Lymphocytes Relative: 58.2 % — ABNORMAL HIGH (ref 12.0–46.0)
Lymphs Abs: 3.9 10*3/uL (ref 0.7–4.0)
MCHC: 34.3 g/dL (ref 30.0–36.0)
MCV: 90.9 fl (ref 78.0–100.0)
Monocytes Absolute: 0.6 10*3/uL (ref 0.1–1.0)
Monocytes Relative: 9.1 % (ref 3.0–12.0)
Neutro Abs: 2.1 10*3/uL (ref 1.4–7.7)
Neutrophils Relative %: 31.1 % — ABNORMAL LOW (ref 43.0–77.0)
Platelets: 209 10*3/uL (ref 150.0–400.0)
RBC: 4.47 Mil/uL (ref 3.87–5.11)
RDW: 17.7 % — ABNORMAL HIGH (ref 11.5–15.5)
WBC: 6.6 10*3/uL (ref 4.0–10.5)

## 2021-12-21 LAB — COMPREHENSIVE METABOLIC PANEL
ALT: 14 U/L (ref 0–35)
AST: 20 U/L (ref 0–37)
Albumin: 4.1 g/dL (ref 3.5–5.2)
Alkaline Phosphatase: 64 U/L (ref 39–117)
BUN: 25 mg/dL — ABNORMAL HIGH (ref 6–23)
CO2: 28 mEq/L (ref 19–32)
Calcium: 10.7 mg/dL — ABNORMAL HIGH (ref 8.4–10.5)
Chloride: 97 mEq/L (ref 96–112)
Creatinine, Ser: 1.47 mg/dL — ABNORMAL HIGH (ref 0.40–1.20)
GFR: 36.75 mL/min — ABNORMAL LOW (ref >60.00)
Glucose, Bld: 134 mg/dL — ABNORMAL HIGH (ref 70–99)
Potassium: 3.8 mEq/L (ref 3.5–5.1)
Sodium: 136 mEq/L (ref 135–145)
Total Bilirubin: 1.3 mg/dL — ABNORMAL HIGH (ref 0.2–1.2)
Total Protein: 6.9 g/dL (ref 6.0–8.3)

## 2021-12-22 ENCOUNTER — Other Ambulatory Visit: Payer: Medicare Other

## 2021-12-22 ENCOUNTER — Encounter: Payer: Self-pay | Admitting: Family Medicine

## 2021-12-22 NOTE — Progress Notes (Unsigned)
Chronic Care Management Pharmacy Note  12/28/2021 Name:  [name] [name] MRN:  [id number] DOB:  1955-12-04  Summary: PharmD FU.  A1c increased since we cut back on metformin.  She was started on Jardiance and metformin icnreased back.  Recent hospital visit while she was in [place].  Tolerating new meds well but she was still taking her Benazepril 21m which looked to be d/c in hospital - recent office BP of 90/62.  Rarely using her Bumex, monitoring weight and denies swelling.  Recommendations/Changes made from today's visit: Dr. HYong Channel- do we want her to continue her Benazepril or d/c with lower BP? If she is indeed taking Metformin XR 10066mBID - current GFR is recommended max dose of 50077mID.  Consider ECHO in future if SOB remains. [facility]  Plan: FU 2 months   Subjective: [name] [name] an [age]o. year old female who is a primary patient of Hunter, SteBrayton MarsD.  The CCM team was consulted for assistance with disease management and care coordination needs.    Engaged with patient by telephone for follow up visit in response to provider referral for pharmacy case management and/or care coordination services.   Consent to Services:  The patient was given the following information about Chronic Care Management services today, agreed to services, and gave verbal consent: 1. CCM service includes personalized support from designated clinical staff supervised by the primary care provider, including individualized plan of care and coordination with other care providers 2. 24/7 contact phone numbers for assistance for urgent and routine care needs. 3. Service will only be billed when office clinical staff spend 20 minutes or more in a month to coordinate care. 4. Only one practitioner may furnish and bill the service in a calendar month. 5.The patient may stop CCM services at any time (effective at the end of the month) by  phone call to the office staff. 6. The patient will be responsible for cost sharing (co-pay) of up to 20% of the service fee (after annual deductible is met). Patient agreed to services and consent obtained.  Patient Care Team: HunMarin OlpD as PCP - General (Family Medicine) HocMinus BreedingD as PCP - Cardiology (Cardiology) [facility] as Consulting Physician OliParalee CancelD as Consulting Physician (Orthopedic Surgery) BroMelina SchoolsD as Consulting Physician (Orthopedic Surgery) GraRoseanne KaufmanD as Consulting Physician (Orthopedic Surgery) [facility] Pharmacist (Pharmacist)  Recent office visits:  None   Recent consult visits:  03/01/2021 VV (Pulmonology) Wal[facility] EhrichP; no medication changes indicated.   Hospital visits:  01/18/2021 Admission for Elective Surgery ACDF C5-6, C6-7 Admit date: 01/18/2021 Discharge date: 01/19/2021 -STOP taking Bactrim DS  Objective:  Lab Results  Component Value Date   CREATININE 1.47 (H) 12/21/2021   BUN 25 (H) 12/21/2021   GFR 36.75 (L) 12/21/2021   EGFR 65 12/10/2020   GFRNONAA >60 01/11/2021   GFRAA 75 04/08/2020   NA 136 12/21/2021   K 3.8 12/21/2021   CALCIUM 10.7 (H) 12/21/2021   CO2 28 12/21/2021   GLUCOSE 134 (H) 12/21/2021    Lab Results  Component Value Date/Time   HGBA1C 9.8 (H) 12/13/2021 04:07 PM   HGBA1C 5.5 06/21/2021 09:46 AM   HGBA1C 6.1 12/04/2019 12:00 AM   HGBA1C 5.3 11/06/2017 12:00 AM   GFR 36.75 (L) 12/21/2021 12:47 PM   GFR 40.00 (L) 12/13/2021 04:07 PM  MICROALBUR 0.8 12/13/2021 04:07 PM   MICROALBUR 3.6 (H) 01/08/2015 09:08 AM    Last diabetic Eye exam:  Lab Results  Component Value Date/Time   HMDIABEYEEXA No Retinopathy 02/09/2021 12:00 AM    Last diabetic Foot exam: No results found for: "HMDIABFOOTEX"   Lab Results  Component Value Date   CHOL 122 06/21/2021   HDL 55.10 06/21/2021   LDLCALC 53 06/21/2021   LDLDIRECT 158.6  02/26/2014   TRIG 69.0 06/21/2021   CHOLHDL 2 06/21/2021       Latest Ref Rng & Units 12/21/2021   12:47 PM 12/13/2021    4:07 PM 06/21/2021    9:46 AM  Hepatic Function  Total Protein 6.0 - 8.3 g/dL 6.9  7.3  5.4   Albumin 3.5 - 5.2 g/dL 4.1  4.2  3.5   AST 0 - 37 U/L _0 ALT 0 - 35 U/L _1 Alk Phosphatase 39 - 117 U/L 64  75  52   Total Bilirubin 0.2 - 1.2 mg/dL 1.3  1.7  0.9     Lab Results  Component Value Date/Time   TSH 3.91 12/13/2021 04:07 PM   TSH 1.65 06/21/2021 09:46 AM   FREET4 1.40 08/15/2013 04:28 PM   FREET4 1.54 04/18/2011 02:50 PM       Latest Ref Rng & Units 12/21/2021   12:47 PM 12/13/2021    4:07 PM 06/21/2021    9:46 AM  CBC  WBC 4.0 - 10.5 K/uL 6.6  5.3  5.8   Hemoglobin 12.0 - 15.0 g/dL 13.9  14.5  13.3   Hematocrit 36.0 - 46.0 % 40.6  42.2  38.2   Platelets 150.0 - 400.0 K/uL 209.0  222.0  183.0     No results found for: "VD25OH"  Clinical ASCVD: No  The ASCVD Risk score (Arnett DK, et al., 2019) failed to calculate for the following reasons:   The patient has a prior MI or stroke diagnosis       12/13/2021    2:21 PM 06/24/2021    1:25 PM 04/23/2021    3:02 PM  Depression screen PHQ 2/9  Decreased Interest 0 0 0  Down, Depressed, Hopeless 0 0 0  PHQ - 2 Score 0 0 0  Altered sleeping 0 0   Tired, decreased energy 0 0   Change in appetite 0 0   Feeling bad or failure about yourself  0 0   Trouble concentrating 0 0   Moving slowly or fidgety/restless 0 0   Suicidal thoughts 0 0   PHQ-9 Score 0 0   Difficult doing work/chores Not difficult at all Not difficult at all       Social History   Tobacco Use  Smoking Status Former   Packs/day: 1.50   Years: 30.00   Total pack years: 45.00   Types: Cigarettes   Start date: 28   Quit date: 02/21/2009   Years since quitting: 12.8  Smokeless Tobacco Never   BP Readings from Last 3 Encounters:  12/27/21 90/62  12/13/21 122/74  07/01/21 100/67   Pulse Readings from  Last 3 Encounters:  12/27/21 70  12/13/21 78  07/01/21 (!) 58   Wt Readings from Last 3 Encounters:  12/27/21 202 lb (91.6 kg)  12/13/21 202 lb (91.6 kg)  07/01/21 214 lb (97.1 kg)   BMI Readings from Last 3 Encounters:  12/27/21 32.60 kg/m  12/13/21 32.60 kg/m  07/01/21  34.54 kg/m    Assessment/Interventions: Review of patient past medical history, allergies, medications, health status, including review of consultants reports, laboratory and other test data, was performed as part of comprehensive evaluation and provision of chronic care management services.   SDOH:  (Social Determinants of Health) assessments and interventions performed: No, done within the year Financial Resource Strain: Low Risk  (04/23/2021)   Overall Financial Resource Strain (CARDIA)    Difficulty of Paying Living Expenses: Not hard at all    [place] Office Visit from 12/14/2020 in [place] Visit from 06/19/2018 in [place] Visit from 11/13/2017 in [place] Visit from 06/28/2017 in [place]  SDOH Interventions      Depression Interventions/Treatment  Medication Counseling Medication Medication, Currently on Treatment      Financial Resource Strain: Low Risk  (04/23/2021)   Overall Financial Resource Strain (CARDIA)    Difficulty of Paying Living Expenses: Not hard at all   Food Insecurity: No Food Insecurity (04/23/2021)   Hunger Vital Sign    Worried About Running Out of Food in the Last Year: Never true    [place] in the Last Year: Never true    SDOH Screenings   Food Insecurity: No Food Insecurity (04/23/2021)  Housing: Low Risk  (04/23/2021)  Transportation Needs: No Transportation Needs (04/23/2021)  Depression (PHQ2-9): Low Risk  (12/13/2021)  Financial Resource Strain: Low Risk  (04/23/2021)  Physical Activity: Inactive (04/23/2021)  Social  Connections: Moderately Integrated (04/23/2021)  Stress: No Stress Concern Present (04/23/2021)  Tobacco Use: Medium Risk (12/27/2021)    CCM Care Plan  Allergies  Allergen Reactions   Adhesive [Tape] Other (See Comments)    blisters   Nitrofurantoin     Diffuse rash   Other Other (See Comments)    (Neoprene)--causes blisters.     Medications Reviewed Today     Reviewed by Edythe Clarity, [facility] (Pharmacist) on 12/28/21 at 1023  Med List Status: <None>   Medication Order Taking? Sig Documenting Provider Last Dose Status Informant  Baclofen 5 MG TABS [id number] Yes Take 1 tablet by mouth 3 (three) times daily as needed. [provider] Taking Active   Blood Glucose Monitoring Suppl [facility] VERIO) w/Device KIT [id number] Yes Use to test blood sugar daily Marin Olp, MD Taking Active   bumetanide (BUMEX) 1 MG tablet [id number] Yes Take 1 mg by mouth daily. [provider] Taking Active   buPROPion (WELLBUTRIN) 75 MG tablet [id number] Yes Take 0.5 tablets (37.5 mg total) by mouth 2 (two) times daily. Marin Olp, MD Taking Active   Cholecalciferol (VITAMIN D HIGH POTENCY) 25 MCG (1000 UT) capsule [id number] Yes Take 1,000 Units by mouth daily. [provider] Taking Active Self  CRANBERRY PO [id number] Yes Take 1 tablet by mouth daily. [provider] Taking Active Self  empagliflozin (JARDIANCE) 10 MG TABS tablet [id number] Yes Take 1 tablet (10 mg total) by mouth daily before breakfast. Marin Olp, MD Taking Active   escitalopram (LEXAPRO) 20 MG tablet [id number] Yes Take 1 tablet (20 mg total) by mouth daily. Marin Olp, MD Taking Active   glucose blood [facility] VERIO) test strip [id number] Yes USE TO CHECK BLOOD SUGAR DAILY Marin Olp, MD Taking Active   levothyroxine (SYNTHROID) 75 MCG tablet [id number] Yes Take 1 tablet (75 mcg total) by mouth daily. Marin Olp, MD Taking Active  metFORMIN (GLUCOPHAGE-XR) 500 MG 24  hr tablet [id number] Yes Take 1 tablet (500 mg total) by mouth in the morning and at bedtime. Marin Olp, MD Taking Active            Med Note Rosana Hoes, Ronald Lobo Dec 27, 2021  3:04 PM) Patient taking 1059m BID  methocarbamol (ROBAXIN) 500 MG tablet [id number] Take 500 mg by mouth every 8 (eight) hours as needed for muscle spasms. [provider] Taking Active Self  OneTouch Delica Lancets 358PMISC [id number] Use to test blood sugar daily HMarin Olp MD Taking Active   potassium chloride SA (KLOR-CON M) 20 MEQ tablet [id number] Take 1 tablet (20 mEq total) by mouth daily. HMarin Olp MD Taking Active   rosuvastatin (CRESTOR) 20 MG tablet [id number] Take 20 mg by mouth daily. [provider] Taking Active   tiZANidine (ZANAFLEX) 4 MG tablet [id number] Take 1 tablet (4 mg total) by mouth 3 (three) times daily as needed for muscle spasms. HMarin Olp MD Taking Active   traZODone (DESYREL) 50 MG tablet [id number] Take 1 tablet (50 mg total) by mouth at bedtime. HMarin Olp MD Taking Active             Patient Active Problem List   Diagnosis Date Noted   Nonrheumatic aortic valve stenosis 12/25/2021   Dyslipidemia 12/25/2021   (HFpEF) heart failure with preserved ejection fraction ([place] 12/13/2021   History of non-ST elevation myocardial infarction (NSTEMI) 12/13/2021   Major depressive disorder with single episode, in full remission ([place] 06/24/2021   S/P cervical spinal fusion 01/18/2021   S/P revision of total knee, left 08/15/2019   Osteoarthritis of ankle, right 01/29/2018   Sleep related headaches 09/07/2016   Nightmares REM-sleep type 09/07/2016   RLS (restless legs syndrome) 09/07/2016   Snoring 09/07/2016   Excessive daytime sleepiness 09/07/2016   Migraine 07/28/2016   Solitary pulmonary nodule 06/08/2016   S/P knee replacement 05/05/2015   Former smoker 07/10/2014   Shortness of breath 06/07/2014    Osteoarthritis 074/14/2395  Alcoholic fatty liver 032/03/3341  Chronic post-traumatic stress disorder (PTSD) 05/02/2014   Sleep disorder 05/02/2014   Family history of alcoholism 05/02/2014   Macrocytosis 04/19/2014   Fatigue 08/15/2013   Thyroid nodule 09/17/2012   Hypothyroid 07/21/2010   Type II diabetes mellitus, well controlled ([place] 06/23/2010   Essential hypertension 06/23/2010   Alcohol use disorder, severe, in sustained remission ([place] 06/23/2010   Hyperlipidemia associated with type 2 diabetes mellitus ([place] 06/23/2010   Chronic depression 06/23/2010   History of colonic polyps 04/12/2010    Immunization History  Administered Date(s) Administered   Fluad Quad(high Dose 65+) 12/09/2019, 12/14/2020   Influenza Split 01/06/2011   Influenza, High Dose Seasonal PF 11/05/2021   Influenza, Quadrivalent, Recombinant, Inj, Pf 11/02/2018   Influenza,inj,Quad PF,6+ Mos 12/14/2012, 11/21/2013, 11/06/2014, 11/28/2016, 11/13/2017   Influenza-Unspecified 11/27/2015   Moderna Covid-19 Vaccine Bivalent Booster 146yr& up 01/06/2021, 11/20/2021   Moderna Sars-Covid-2 Vaccination 04/17/2019, 05/15/2019, 12/30/2019, 05/26/2020   PNEUMOCOCCAL CONJUGATE-20 06/24/2021   Pneumococcal Conjugate-13 10/17/2013   Pneumococcal Polysaccharide-23 03/13/2015   Pneumococcal-Unspecified 02/21/2006   Tdap 06/06/2012   Zoster Recombinat (Shingrix) 02/08/2019, 05/30/2019   Zoster, Live 10/09/2014    Conditions to be addressed/monitored:  HTN, Type II DM, HLD, Hypothyroidism, Osteoarthritis, Depression, Alcohol Use Disorder  Care Plan : General Pharmacy (Adult)  Updates made by DaEdythe ClarityRPH since 12/28/2021 12:00 AM  Problem: HTN, Type II DM, HLD, Hypothyroidism, Osteoarthritis, Depression   Priority: High  Onset Date: 06/23/2021     Long-Range Goal: Patient-Specific Goal   Start Date: 06/23/2021  Expected End Date: 12/24/2021  Recent Progress: On track  Priority: High  Note:    Current Barriers:  Elevated A1c BP medication confusion  Pharmacist Clinical Goal(s):  Patient will achieve improvement in anger symptoms as evidenced by patient reporting through collaboration with PharmD and provider.   Interventions: 1:1 collaboration with Marin Olp, MD regarding development and update of comprehensive plan of care as evidenced by provider attestation and co-signature Inter-disciplinary care team collaboration (see longitudinal plan of care) Comprehensive medication review performed; medication list updated in electronic medical record  Hypertension (BP goal <130/80) 12/27/21 -Query Controlled -Current treatment: None -Medications previously tried: Benazepril 72m  -Current home readings: has not been checking at home -Denies hypotensive/hypertensive symptoms -Educated on BP goals and benefits of medications for prevention of heart attack, stroke and kidney damage; Daily salt intake goal < 2300 mg; Importance of home blood pressure monitoring; -Counseled to monitor BP at home a few times per week, document, and provide log at future appointments -Recommended to continue current medication Patient had been taking Benazepril.  It appears that this was stopped in the hospital due to unknown reasons possibly to allow for diuresis.  She has cut back on her fluid pills at this time.  Will consult with CPP whether he wants her to continue. FU after consultation - patient will need to check her BP at home periodically.  Hyperlipidemia: (LDL goal < 70) -Controlled, not assessed - patient needs refills -Current treatment: Rosuvastatin 248mAppropriate, Effective, Safe, Accessible -Medications previously tried: none noted  -Educated on Cholesterol goals;  Benefits of statin for ASCVD risk reduction; -Recommended to continue current medication LDL well controlled, tolerating well there is no need to make changes at this time.  Diabetes (A1c goal <7%)  12/27/21 -Uncontrolled, based on recent A1c of 9.8 -Current medications: Metformin 100038mR two tablets twice daily Appropriate, Query Effective Jardiance 38m53mpropriate, Query effective, ,  -Medications previously tried: none noted  -Current home glucose readings fasting glucose: 120 this morning post prandial glucose: unknown -Educated on A1c and blood sugar goals; Prevention and management of hypoglycemic episodes; Benefits of routine self-monitoring of blood sugar; -Counseled to check feet daily and get yearly eye exams -Recommended to continue current medication -We had cut back on her Metformin to XR 500mg28m, [place] more [place].  A1c increased, at this point Jardiance was started.  She says fasting sugars have improved and she has gone "sugar free." Fasting sugars have improved, she is tolerating well. Plans to follow with endocrine but cannot get in until April. Recheck A1c in 3 months and adjust meds as needed.  Continue to monitor renal function. Recommend reducing metformin dose back to 500mg 45m  Depression/Anxiety (Goal: Reduce symptoms) -Not ideally controlled   -Current treatment: Escitalopram 20mg A47mpriate, Query effective, ,  Bupropion 75mg on38mlf tablet twice daily Appropriate, Effective, Safe, Accessible -Medications previously tried/failed: none noted -PHQ9:     04/23/2021    3:02 PM 12/14/2020    1:38 PM 07/28/2020   10:52 AM  PHQ9 SCORE ONLY  PHQ-9 Total Score 0 1 0   -GAD7:     06/28/2017   10:17 AM 11/28/2016    3:29 PM  GAD 7 : Generalized Anxiety Score  Nervous, Anxious, on  Edge 0 1  Control/stop worrying 0 0  Worry too much - different things 0 1  Trouble relaxing 0 1  Restless 0 1  Easily annoyed or irritable 0 0  Afraid - awful might happen 0 0  Total GAD 7 Score 0 4  Anxiety Difficulty Not difficult at all Not difficult at all   After discussion and med review, it was  discovered she did not have her Lexapro home and had not been taking for what appears to be 3 months.  She reports increased irritability and anger spells, -Educated on Benefits of medication for symptom control -Recommended she resume Lexapro, hopefully this will help her current symptoms. Prescription called in for 39m daily, she could start with 1/2 tab daily for the first 4 weeks to taper back up to old treatment dose.  Hypothryoidism (Goal: Maintain TSH) -Controlled -Current treatment  Levothyroxine 725m daily Appropriate, Effective, Safe, Accessible -Medications previously tried: none noted  -Recommended to continue current medication Takes appropriately, TSH is WNL.  Patient Goals/Self-Care Activities Patient will:  - take medications as prescribed as evidenced by patient report and record review focus on medication adherence by pill counts check glucose daily, document, and provide at future appointments  Follow Up Plan: The care management team will reach out to the patient again over the next 6 months days.           Medication Assistance: None required.  Patient affirms current coverage meets needs.  Compliance/Adherence/Medication fill history: Care Gaps: None at this time  Star-Rating Drugs: Simvastatin 2053m9/15/23 90ds Metformin ER 500m43m/24/23 90ds Jardance 10mg74m27/23 90ds  [facility] [address] [place] [id number]: [phone] pill box? Yes Pt endorses 100% compliance  We discussed: Benefits of medication synchronization, packaging and delivery as well as enhanced pharmacist oversight with [employer]. Patient decided to: Continue current medication management strategy  Care Plan and Follow Up Patient Decision:  Patient agrees to Care Plan and Follow-up.  Plan: The care management team will reach out to the  patient again over the next 180 days.  ChrisBeverly MilchrmD Clinical Pharmacist  [phone]

## 2021-12-23 ENCOUNTER — Telehealth: Payer: Self-pay | Admitting: Pharmacist

## 2021-12-23 NOTE — Progress Notes (Signed)
Erroneous Encounter

## 2021-12-24 ENCOUNTER — Ambulatory Visit: Payer: Medicare Other | Admitting: Cardiology

## 2021-12-25 DIAGNOSIS — E785 Hyperlipidemia, unspecified: Secondary | ICD-10-CM | POA: Insufficient documentation

## 2021-12-25 DIAGNOSIS — I350 Nonrheumatic aortic (valve) stenosis: Secondary | ICD-10-CM | POA: Insufficient documentation

## 2021-12-25 NOTE — Progress Notes (Unsigned)
Cardiology Office Note   Date:  12/25/2021   ID:  [name] [name], DOB 01-May-1955, MRN [id number]  PCP:  [name] Olp, MD  Cardiologist:   Minus Breeding, MD Referring:  [name] Olp, MD  No chief complaint on file.     History of Present Illness: [name] [name] is a 66 y.o. female who is referred by [name] Olp, MD for evaluation of SOB and preop evaluation prior to knee surgery.    The patient has a previous history of diabetes.  She saw Dr. Claiborne Billings in 2016.  She apparently was seen preoperatively at that time as well.  I was able to retrieve.  Treadmill stress test which was negative.  She did have some shortness of breath at that time.  She had some mild aortic stenosis recorded previously but not documented in follow-up.  Echo in 2019 demonstrated NL EF.     Since I last saw her she was in the hospital in *** in August 2023.  I was able to see some notes which suggested that she had an NSTEMI and possible pericarditis.  However, echo demonstrated no effusion.   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 ***  *** she is done relatively well.  She is not able to be as active as she would like because she is also had some foot surgery.  She is having some back problems.  With her activities that she is doing she gets some shortness of breath bending over but she thinks this is because of a muscular issue.  However, she is not describing any dyspnea on exertion and no PND or orthopnea.  Said no palpitations, presyncope or syncope.  Has had no edema..     Past Medical History:  Diagnosis Date   Acute meniscal tear of knee    Left knee   Alcohol problem drinking    rehab   Anemia    menstrual related   Anxiety    Arthritis    right ankle-neuropathy   Colon polyps    Depression    DM (diabetes mellitus), type 2 ([place]) 12/2009   type 2   Dyspnea    Evalluate for OSA (obstructive sleep apnea) 08/15/2013   No OSA on sleep study but may be underestimated.-never  used cpap     GERD (gastroesophageal reflux disease)    not currently taking.   Hyperlipidemia    Hypertension    Hypothyroidism    Liver disease    Neuromuscular disorder ([place])    neropathy bilateral feet.   Thyroid disease     Past Surgical History:  Procedure Laterality Date   ADENOIDECTOMY     ANTERIOR CERVICAL DECOMP/DISCECTOMY FUSION N/A 01/18/2021   Procedure: Cervical five-six Cervical six-seven Anterior Cervical Decompression/ discectomy Fusion;  Surgeon: [name] Moore, MD;  Location: [facility];  Service: Neurosurgery;  Laterality: N/A;   BREAST SURGERY Right    lumpectomy-benign   CESAREAN SECTION  1987   1 time   CHOLECYSTECTOMY     HAMMER TOE SURGERY     Dr. Doran [name]- feb 2022   HAMMERTOE RECONSTRUCTION WITH WEIL OSTEOTOMY Bilateral 04/02/2020   Procedure: Bilateral 2-3 Weil and hammertoe corrections;  Surgeon: [name] Simmer, MD;  Location: [facility];  Service: Orthopedics;  Laterality: Bilateral;   INNER EAR SURGERY     left ear had tubes put in and removed, scar tissue built up/ loss of hearing in left ear for over a year  MASS EXCISION Left 12/16/2013   Procedure: EXCISION LEFT  DISTAL THIGH MASS;  Surgeon: [name] Pole, MD;  Location: [facility];  Service: Orthopedics;  Laterality: Left;   right ankle surgery      x 2- no retained hardware   right rotator cuff surgery      left side   TONSILLECTOMY     TOTAL KNEE ARTHROPLASTY  2009   right   TOTAL KNEE ARTHROPLASTY Left 05/05/2015   Procedure: LEFT TOTAL KNEE ARTHROPLASTY;  Surgeon: [name] Cancel, MD;  Location: [facility];  Service: Orthopedics;  Laterality: Left;   TOTAL KNEE REVISION Right 12/16/2013   Procedure: RIGHT TOTAL KNEE REVISION POLY EXCHANGE;  Surgeon: [name] Pole, MD;  Location: [facility];  Service: Orthopedics;  Laterality: Right;   TOTAL KNEE REVISION Left 08/15/2019   Procedure: LEFT KNEE REVISION;  Surgeon: [name] Cancel, MD;  Location: [facility];  Service: Orthopedics;   Laterality: Left;  2 hrs   TUBAL LIGATION  1987     Current Outpatient Medications  Medication Sig Dispense Refill   Baclofen 5 MG TABS Take 1 tablet by mouth 3 (three) times daily as needed.     Blood Glucose Monitoring Suppl (ONETOUCH VERIO) w/Device KIT Use to test blood sugar daily 1 kit 0   bumetanide (BUMEX) 1 MG tablet Take 1 mg by mouth daily.     buPROPion (WELLBUTRIN) 75 MG tablet Take 0.5 tablets (37.5 mg total) by mouth 2 (two) times daily. 90 tablet 3   Cholecalciferol (VITAMIN D HIGH POTENCY) 25 MCG (1000 UT) capsule Take 1,000 Units by mouth daily.     CRANBERRY PO Take 1 tablet by mouth daily.     empagliflozin (JARDIANCE) 10 MG TABS tablet Take 1 tablet (10 mg total) by mouth daily before breakfast. 90 tablet 3   escitalopram (LEXAPRO) 20 MG tablet Take 1 tablet (20 mg total) by mouth daily. 90 tablet 3   flurbiprofen (ANSAID) 100 MG tablet Take 1 tablet (100 mg total) by mouth 2 (two) times daily as needed (for headaches). 60 tablet 2   glucose blood (ONETOUCH VERIO) test strip USE TO CHECK BLOOD SUGAR DAILY 100 each 12   levothyroxine (SYNTHROID) 75 MCG tablet Take 1 tablet (75 mcg total) by mouth daily. 90 tablet 3   magnesium oxide (MAG-OX) 400 (240 Mg) MG tablet Take 1 tablet by mouth daily.     metFORMIN (GLUCOPHAGE-XR) 500 MG 24 hr tablet Take 1 tablet (500 mg total) by mouth in the morning and at bedtime. 180 tablet 3   methocarbamol (ROBAXIN) 500 MG tablet Take 500 mg by mouth every 8 (eight) hours as needed for muscle spasms.     omeprazole (PRILOSEC) 40 MG capsule Take 1 capsule (40 mg total) by mouth daily. 90 capsule 3   OneTouch Delica Lancets 55H MISC Use to test blood sugar daily 100 each 5   potassium chloride SA (KLOR-CON M) 20 MEQ tablet Take 1 tablet (20 mEq total) by mouth daily. 90 tablet 0   rosuvastatin (CRESTOR) 20 MG tablet Take 20 mg by mouth daily.     tiZANidine (ZANAFLEX) 4 MG tablet Take 1 tablet (4 mg total) by mouth 3 (three) times daily as  needed for muscle spasms. 270 tablet 1   traZODone (DESYREL) 50 MG tablet Take 1 tablet (50 mg total) by mouth at bedtime. 90 tablet 3   No current facility-administered medications for this visit.    Allergies:   Adhesive [tape], Nitrofurantoin, and Other  ROS:  Please see the history of present illness.   Otherwise, review of systems are positive for ***.   All other systems are reviewed and negative.    PHYSICAL EXAM: VS:  There were no vitals taken for this visit. , BMI There is no height or weight on file to calculate BMI. GENERAL:  Well appearing NECK:  No jugular venous distention, waveform within normal limits, carotid upstroke brisk and symmetric, no bruits, no thyromegaly LUNGS:  Clear to auscultation bilaterally CHEST:  Unremarkable HEART:  PMI not displaced or sustained,S1 and S2 within normal limits, no S3, no S4, no clicks, no rubs, *** murmurs ABD:  Flat, positive bowel sounds normal in frequency in pitch, no bruits, no rebound, no guarding, no midline pulsatile mass, no hepatomegaly, no splenomegaly EXT:  2 plus pulses throughout, no edema, no cyanosis no clubbing     ***GENERAL:  Well appearing NECK:  No jugular venous distention, waveform within normal limits, carotid upstroke brisk and symmetric, left bruits, no thyromegaly LUNGS:  Clear to auscultation bilaterally CHEST:  Unremarkable HEART:  PMI not displaced or sustained,S1 and S2 within normal limits, no S3, no S4, no clicks, no rubs, no murmurs ABD:  Flat, positive bowel sounds normal in frequency in pitch, no bruits, no rebound, no guarding, no midline pulsatile mass, no hepatomegaly, no splenomegaly EXT:  2 plus pulses throughout, no edema, no cyanosis no clubbing   EKG:  EKG is not  *** ordered today. The ekg ordered today demonstrates sinus rhythm, rate ***, axis within normal limits, intervals within normal limits, no acute ST-T wave changes.   Recent Labs: 12/13/2021: Magnesium 1.9; TSH  3.91 12/21/2021: ALT 14; BUN 25; Creatinine, Ser 1.47; Hemoglobin 13.9; Platelets 209.0; Potassium 3.8; Sodium 136    Lipid Panel    Component Value Date/Time   CHOL 122 06/21/2021 0946   CHOL 169 04/08/2020 1517   TRIG 69.0 06/21/2021 0946   HDL 55.10 06/21/2021 0946   HDL 66 04/08/2020 1517   CHOLHDL 2 06/21/2021 0946   VLDL 13.8 06/21/2021 0946   LDLCALC 53 06/21/2021 0946   LDLCALC 87 04/08/2020 1517   LDLCALC 46 01/25/2019 1602   LDLDIRECT 158.6 02/26/2014 1026      Wt Readings from Last 3 Encounters:  12/13/21 202 lb (91.6 kg)  07/01/21 214 lb (97.1 kg)  06/24/21 210 lb 6 oz (95.4 kg)      Other studies Reviewed: Additional studies/ records that were reviewed today include:  *** Review of the above records demonstrates:  Please see elsewhere in the note.     ASSESSMENT AND PLAN:  SOB:   *** She has had no new shortness of breath.  No further work-up is suggested.   MURMUR:    ***   There were no significant abnormalities on echo. In 2019.  I do not really appreciate a significant murmur today.  No change in therapy.   DM: Her A1c is *** usually in the fives.  Most recently was 5.3.  I will defer to [name] Olp, MD   DYSLIPIDEMIA: Her LDL was ***  lipids are LDL 87 and HDL 66.   BRUIT: She does have a left bruit.  She did not have any significant disease in 2017 when she had a Doppler.  This was prompted by an MRI that suggested an old stroke possibly.  I will repeat a carotid Doppler.  CHEST PAIN:  She had no pericardial effusion and non obstructive CAD.    **  Current medicines are reviewed at length with the patient today.  The patient does not have concerns regarding medicines.  The following changes have been made:  None  Labs/ tests ordered today include:  No orders of the defined types were placed in this encounter.    Follow-up with me ***   Signed, Minus Breeding, MD  12/25/2021 4:16 PM    [facility]

## 2021-12-27 ENCOUNTER — Ambulatory Visit: Payer: Medicare Other | Admitting: Pharmacist

## 2021-12-27 ENCOUNTER — Ambulatory Visit: Payer: Medicare Other | Attending: Cardiology | Admitting: Cardiology

## 2021-12-27 ENCOUNTER — Encounter: Payer: Self-pay | Admitting: Cardiology

## 2021-12-27 VITALS — BP 90/62 | HR 70 | Ht 66.0 in | Wt 202.0 lb

## 2021-12-27 DIAGNOSIS — I350 Nonrheumatic aortic (valve) stenosis: Secondary | ICD-10-CM | POA: Diagnosis not present

## 2021-12-27 DIAGNOSIS — R0602 Shortness of breath: Secondary | ICD-10-CM | POA: Insufficient documentation

## 2021-12-27 DIAGNOSIS — E785 Hyperlipidemia, unspecified: Secondary | ICD-10-CM | POA: Diagnosis present

## 2021-12-27 DIAGNOSIS — E119 Type 2 diabetes mellitus without complications: Secondary | ICD-10-CM | POA: Diagnosis present

## 2021-12-27 DIAGNOSIS — E1169 Type 2 diabetes mellitus with other specified complication: Secondary | ICD-10-CM

## 2021-12-27 DIAGNOSIS — I10 Essential (primary) hypertension: Secondary | ICD-10-CM

## 2021-12-27 NOTE — Patient Instructions (Signed)
Medication Instructions:  The current medical regimen is effective;  continue present plan and medications.  *If you need a refill on your cardiac medications before your next appointment, please call your pharmacy*   Follow-Up: At [facility], you and your health needs are our priority.  As part of our continuing mission to provide you with exceptional heart care, we have created designated Provider Care Teams.  These Care Teams include your primary Cardiologist (physician) and Advanced Practice Providers (APPs -  Physician Assistants and Nurse Practitioners) who all work together to provide you with the care you need, when you need it.  We recommend signing up for the patient portal called "MyChart".  Sign up information is provided on this After Visit Summary.  MyChart is used to connect with patients for Virtual Visits (Telemedicine).  Patients are able to view lab/test results, encounter notes, upcoming appointments, etc.  Non-urgent messages can be sent to your provider as well.   To learn more about what you can do with MyChart, go to [email or web address].    Your next appointment:   6 month(s)  The format for your next appointment:   In Person  Provider:   Minus Breeding, MD

## 2021-12-28 ENCOUNTER — Ambulatory Visit: Payer: Medicare Other | Admitting: Family Medicine

## 2021-12-28 NOTE — Patient Instructions (Addendum)
Visit Information   Goals Addressed             This Visit's Progress    Track and Manage My Symptoms-Depression   On track    Timeframe:  Long-Range Goal Priority:  High Start Date:   06/23/21                          Expected End Date:  12/26/21                     Follow Up Date 09/25/21    - avoid negative self-talk - exercise at least 2 to 3 times per week - have a plan for how to handle bad days    Why is this important?   Keeping track of your progress will help your treatment team find the right mix of medicine and therapy for you.  Write in your journal every day.  Day-to-day changes in depression symptoms are normal. It may be more helpful to check your progress at the end of each week instead of every day.     Notes: Resuming lexapro today (06/23/21)       Patient Care Plan: General Pharmacy (Adult)     Problem Identified: HTN, Type II DM, HLD, Hypothyroidism, Osteoarthritis, Depression   Priority: High  Onset Date: 06/23/2021     Long-Range Goal: Patient-Specific Goal   Start Date: 06/23/2021  Expected End Date: 12/24/2021  Recent Progress: On track  Priority: High  Note:   Current Barriers:  Elevated A1c BP medication confusion  Pharmacist Clinical Goal(s):  Patient will achieve improvement in anger symptoms as evidenced by patient reporting through collaboration with PharmD and provider.   Interventions: 1:1 collaboration with Marin Olp, MD regarding development and update of comprehensive plan of care as evidenced by provider attestation and co-signature Inter-disciplinary care team collaboration (see longitudinal plan of care) Comprehensive medication review performed; medication list updated in electronic medical record  Hypertension (BP goal <130/80) 12/27/21 -Query Controlled -Current treatment: None -Medications previously tried: Benazepril '20mg'$   -Current home readings: has not been checking at home -Denies hypotensive/hypertensive  symptoms -Educated on BP goals and benefits of medications for prevention of heart attack, stroke and kidney damage; Daily salt intake goal < 2300 mg; Importance of home blood pressure monitoring; -Counseled to monitor BP at home a few times per week, document, and provide log at future appointments -Recommended to continue current medication Patient had been taking Benazepril.  It appears that this was stopped in the hospital due to unknown reasons possibly to allow for diuresis.  She has cut back on her fluid pills at this time.  Will consult with CPP whether he wants her to continue. FU after consultation - patient will need to check her BP at home periodically.  Hyperlipidemia: (LDL goal < 70) -Controlled, not assessed - patient needs refills -Current treatment: Rosuvastatin '20mg'$  Appropriate, Effective, Safe, Accessible -Medications previously tried: none noted  -Educated on Cholesterol goals;  Benefits of statin for ASCVD risk reduction; -Recommended to continue current medication LDL well controlled, tolerating well there is no need to make changes at this time.  Diabetes (A1c goal <7%) 12/27/21 -Uncontrolled, based on recent A1c of 9.8 -Current medications: Metformin '1000mg'$  XR two tablets twice daily Appropriate, Query Effective Jardiance '10mg'$  Appropriate, Query effective, ,  -Medications previously tried: none noted  -Current home glucose readings fasting glucose: 120 this morning post prandial glucose: unknown -Educated on A1c and  blood sugar goals; Prevention and management of hypoglycemic episodes; Benefits of routine self-monitoring of blood sugar; -Counseled to check feet daily and get yearly eye exams -Recommended to continue current medication -We had cut back on her Metformin to XR '500mg'$  BID, she went to [place] for the summer and reports she started eating more sweets.  A1c increased, at this point Jardiance was started.  She says fasting sugars have improved and  she has gone "sugar free." Fasting sugars have improved, she is tolerating well. Plans to follow with endocrine but cannot get in until April. Recheck A1c in 3 months and adjust meds as needed.  Continue to monitor renal function. Recommend reducing metformin dose back to '500mg'$  BID.  Depression/Anxiety (Goal: Reduce symptoms) -Not ideally controlled   -Current treatment: Escitalopram '20mg'$  Appropriate, Query effective, ,  Bupropion '75mg'$  one-half tablet twice daily Appropriate, Effective, Safe, Accessible -Medications previously tried/failed: none noted -PHQ9:     04/23/2021    3:02 PM 12/14/2020    1:38 PM 07/28/2020   10:52 AM  PHQ9 SCORE ONLY  PHQ-9 Total Score 0 1 0   -GAD7:     06/28/2017   10:17 AM 11/28/2016    3:29 PM  GAD 7 : Generalized Anxiety Score  Nervous, Anxious, on Edge 0 1  Control/stop worrying 0 0  Worry too much - different things 0 1  Trouble relaxing 0 1  Restless 0 1  Easily annoyed or irritable 0 0  Afraid - awful might happen 0 0  Total GAD 7 Score 0 4  Anxiety Difficulty Not difficult at all Not difficult at all   After discussion and med review, it was discovered she did not have her Lexapro home and had not been taking for what appears to be 3 months.  She reports increased irritability and anger spells, -Educated on Benefits of medication for symptom control -Recommended she resume Lexapro, hopefully this will help her current symptoms. Prescription called in for '20mg'$  daily, she could start with 1/2 tab daily for the first 4 weeks to taper back up to old treatment dose.  Hypothryoidism (Goal: Maintain TSH) -Controlled -Current treatment  Levothyroxine 69mg daily Appropriate, Effective, Safe, Accessible -Medications previously tried: none noted  -Recommended to continue current medication Takes appropriately, TSH is WNL.  Patient Goals/Self-Care Activities Patient will:  - take medications as prescribed as evidenced by patient report and record  review focus on medication adherence by pill counts check glucose daily, document, and provide at future appointments  Follow Up Plan: The care management team will reach out to the patient again over the next 6 months days.           The patient verbalized understanding of instructions, educational materials, and care plan provided today and DECLINED offer to receive copy of patient instructions, educational materials, and care plan.  Telephone follow up appointment with pharmacy team member scheduled for: 2 months  CEdythe Clarity RHaskins PharmD Clinical Pharmacist  [facility]([phone]

## 2021-12-29 ENCOUNTER — Telehealth: Payer: Self-pay | Admitting: Family Medicine

## 2021-12-29 MED ORDER — ROSUVASTATIN CALCIUM 20 MG PO TABS: 20.0000 mg | ORAL_TABLET | Freq: Every day | ORAL | 3 refills | Status: AC

## 2021-12-29 NOTE — Telephone Encounter (Signed)
Do you know the nature of the visit?

## 2021-12-29 NOTE — Addendum Note (Signed)
Addended by: Edythe Clarity on: 12/29/2021 09:37 AM   Modules accepted: Orders

## 2021-12-29 NOTE — Progress Notes (Signed)
Crestor refilled.  Will notify patient to continue Metformin and Benazepril for now.  Advised to schedule FU in 2-3 weeks for monitoring.  CMA to follow BP closely.  Beverly Milch, PharmD Clinical Pharmacist  [facility] [phone]

## 2021-12-29 NOTE — Telephone Encounter (Signed)
Patient states: -She spoke to [employer], pharmacist, who informed her that she will need a follow up appointment.  -She is unsure who the appointment needs to be with   Patient requests: -A callback to clear up confusion  Patient has a follow up with PCP scheduled on 03/21/22 and a follow up scheduled with pharmacist on 02/28/22. Please advise.

## 2021-12-30 NOTE — Telephone Encounter (Signed)
Dr. Yong Channel wanted her to return to FU on BP to see if she needed to continue on her benazepril or not.

## 2022-01-04 ENCOUNTER — Ambulatory Visit (INDEPENDENT_AMBULATORY_CARE_PROVIDER_SITE_OTHER): Payer: Medicare Other | Admitting: Family Medicine

## 2022-01-04 ENCOUNTER — Encounter: Payer: Self-pay | Admitting: Family Medicine

## 2022-01-04 VITALS — BP 108/58 | HR 64 | Temp 98.0°F | Ht 66.0 in | Wt 201.8 lb

## 2022-01-04 DIAGNOSIS — I5032 Chronic diastolic (congestive) heart failure: Secondary | ICD-10-CM | POA: Diagnosis not present

## 2022-01-04 DIAGNOSIS — G43809 Other migraine, not intractable, without status migrainosus: Secondary | ICD-10-CM | POA: Diagnosis not present

## 2022-01-04 DIAGNOSIS — I10 Essential (primary) hypertension: Secondary | ICD-10-CM

## 2022-01-04 DIAGNOSIS — E119 Type 2 diabetes mellitus without complications: Secondary | ICD-10-CM

## 2022-01-04 NOTE — Progress Notes (Addendum)
Phone [phone] In person visit   Subjective:   [name] [name] is a 66 y.o. year old very pleasant female patient who presents for/with See problem oriented charting Chief Complaint  Patient presents with   Hypertension    Pt is here to f/u on bp.   Past Medical History-  Patient Active Problem List   Diagnosis Date Noted   (HFpEF) heart failure with preserved ejection fraction ([place]) 12/13/2021    Priority: High   History of non-ST elevation myocardial infarction (NSTEMI) 12/13/2021    Priority: High   Solitary pulmonary nodule 06/08/2016    Priority: High   Alcoholic fatty liver 22/29/7989    Priority: High   Type II diabetes mellitus, well controlled ([place]) 06/23/2010    Priority: High   Alcohol use disorder, severe, in sustained remission ([place]) 06/23/2010    Priority: High   Chronic depression 06/23/2010    Priority: High   Migraine 07/28/2016    Priority: Medium    Hypothyroid 07/21/2010    Priority: Medium    Essential hypertension 06/23/2010    Priority: Medium    Hyperlipidemia associated with type 2 diabetes mellitus ([place]) 06/23/2010    Priority: Medium    S/P revision of total knee, left 08/15/2019    Priority: Low   Excessive daytime sleepiness 09/07/2016    Priority: Low   S/P knee replacement 05/05/2015    Priority: Low   Former smoker 07/10/2014    Priority: Low   Osteoarthritis 05/16/2014    Priority: Low   Chronic post-traumatic stress disorder (PTSD) 05/02/2014    Priority: Low   Sleep disorder 05/02/2014    Priority: Low   Family history of alcoholism 05/02/2014    Priority: Low   Macrocytosis 04/19/2014    Priority: Low   Fatigue 08/15/2013    Priority: Low   Thyroid nodule 09/17/2012    Priority: Low   History of colonic polyps 04/12/2010    Priority: Low   Nonrheumatic aortic valve stenosis 12/25/2021   Dyslipidemia 12/25/2021   Major depressive disorder with single episode, in full remission ([place]) 06/24/2021   S/P cervical spinal  fusion 01/18/2021   Osteoarthritis of ankle, right 01/29/2018   Sleep related headaches 09/07/2016   Nightmares REM-sleep type 09/07/2016   RLS (restless legs syndrome) 09/07/2016   Snoring 09/07/2016   Shortness of breath 06/07/2014    Medications- reviewed and updated Current Outpatient Medications  Medication Sig Dispense Refill   Baclofen 5 MG TABS Take 1 tablet by mouth 3 (three) times daily as needed.     Blood Glucose Monitoring Suppl (ONETOUCH VERIO) w/Device KIT Use to test blood sugar daily 1 kit 0   bumetanide (BUMEX) 1 MG tablet Take 1 mg by mouth daily.     buPROPion (WELLBUTRIN) 75 MG tablet Take 0.5 tablets (37.5 mg total) by mouth 2 (two) times daily. 90 tablet 3   Cholecalciferol (VITAMIN D HIGH POTENCY) 25 MCG (1000 UT) capsule Take 1,000 Units by mouth daily.     CRANBERRY PO Take 1 tablet by mouth daily.     empagliflozin (JARDIANCE) 10 MG TABS tablet Take 1 tablet (10 mg total) by mouth daily before breakfast. 90 tablet 3   escitalopram (LEXAPRO) 20 MG tablet Take 1 tablet (20 mg total) by mouth daily. 90 tablet 3   glucose blood (ONETOUCH VERIO) test strip USE TO CHECK BLOOD SUGAR DAILY 100 each 12   levothyroxine (SYNTHROID) 75 MCG tablet Take 1 tablet (75 mcg total) by mouth daily. [place]  tablet 3   metFORMIN (GLUCOPHAGE-XR) 500 MG 24 hr tablet Take 1 tablet (500 mg total) by mouth in the morning and at bedtime. 180 tablet 3   methocarbamol (ROBAXIN) 500 MG tablet Take 500 mg by mouth every 8 (eight) hours as needed for muscle spasms.     OneTouch Delica Lancets 09F MISC Use to test blood sugar daily 100 each 5   potassium chloride SA (KLOR-CON M) 20 MEQ tablet Take 1 tablet (20 mEq total) by mouth daily. 90 tablet 0   rosuvastatin (CRESTOR) 20 MG tablet Take 1 tablet (20 mg total) by mouth daily. 90 tablet 3   tiZANidine (ZANAFLEX) 4 MG tablet Take 1 tablet (4 mg total) by mouth 3 (three) times daily as needed for muscle spasms. 270 tablet 1   traZODone (DESYREL) 50  MG tablet Take 1 tablet (50 mg total) by mouth at bedtime. 90 tablet 3   No current facility-administered medications for this visit.     Objective:  BP (!) 108/58   Pulse 64   Temp 98 F (36.7 C)   Ht _0  (1.676 m)   Wt 201 lb 12.8 oz (91.5 kg)   SpO2 95%   BMI 32.57 kg/m  Gen: NAD, resting comfortably CV: RRR no murmurs rubs or gallops Lungs: CTAB no crackles, wheeze, rhonchi Ext: no edema Skin: warm, dry     Assessment and Plan   #Of note patient had seen CCM pharmacist on 12/27/21- we discussed concerns/changes from that visit today. Reentering referral today.   # Migraine history S:migraine headaches intermittently when younger- began to get more regular in at least the last few years. We in 2017 in August ordered MR brain for daily migraines for 6-8 months- reassuring exam overall- some small vessel ischemic changes and old small right cerebellar infarct  She saw neurology in 2018 but their main plan was sleep study and was told needed to have that before headache evaluation - she had a sleep study with pulmonary and was on cpap until they recalled her machine- that was a couple years ago. Does not recall worsening headaches around that time. A/P: with ongoing daily migraines- will refer to neurology for their opinion on treatment.   - sounds like may have been taken off of triptans due to heart concerns -THC gummy helps some  # Diabetes S: Medication:Jardiance 10 mg, metformin 1000 mg in the morning and 1000 mg at night planned but renal function worsened and back down to 554m twice daily -mild flank tenderness CBGs- sugars improved drastically down to 128-151 range in the mornings Lab Results  Component Value Date   HGBA1C 9.8 (H) 12/13/2021   HGBA1C 5.5 06/21/2021   HGBA1C 5.5 12/10/2020   A/P: hopefully renal function improved and can increase metformin again- prefer GFR above 45 if possible. Jardiance seems to be helpful and no UTI or yeast issues she is aware  of  #hypertension/CHF diastolic S: medication: NONE- Prior benazepril but was likely stopped due to low blood pressures.  Was on Bumex 1 mg daily now just as needed- has not needed Home readings #s: ranging from 91-110/50-75  -Weight stable from last visit- mild fluctuations within 2 lbs each direction from 200 no increased edema   - does not feel poorly including lightheadedness on lower blood pressure unless stands up quickly BP Readings from Last 3 Encounters:  01/04/22 (!) 108/58  12/27/21 90/62  12/13/21 122/74  A/P: blood pressure running low acceptable range despite not being on  medication anymore- we will continue to monitor but not overly symptomatic- do not think at the point needs something to raise blood pressure -CHF appears euvolemic- continue current meds - we will also update cmp due to worsening creatinine  Recommended follow up: Return for as needed for new, worsening, persistent symptoms. Future Appointments  Date Time Provider [place]  02/28/2022 12:30 PM [facility] CCM PHARMACIST [facility]  03/21/2022  3:00 PM Marin Olp, MD [facility]  05/06/2022  1:00 PM [facility] HEALTH COACH [facility]  06/16/2022 10:50 AM Shamleffer, Melanie Crazier, MD [facility] None   Lab/Order associations:   ICD-10-CM   1. Other migraine without status migrainosus, not intractable  G43.809 Ambulatory referral to Neurology    2. Chronic heart failure with preserved ejection fraction (HCC)  I50.32 Comprehensive metabolic panel    3. Essential hypertension  I10 Comprehensive metabolic panel    4. Type II diabetes mellitus, well controlled ([place])  E11.9       No orders of the defined types were placed in this encounter.   Return precautions advised.  [name], MD

## 2022-01-04 NOTE — Patient Instructions (Addendum)
We will call you within two weeks about your referral to neurology. If you do not hear within 2 weeks, give [place] a call.   Bumex only if needed for swelling or weight gain 3 lbs in a day or 5 lbs within 3 days.   Please stop by lab before you go If you have mychart- we will send your results within 3 business days of [place] receiving them.  If you do not have mychart- we will call you about results within 5 business days of [place] receiving them.  *please also note that you will see labs on mychart as soon as they post. I will later go in and write notes on them- will say "notes from Dr. Yong Channel"   Recommended follow up: Return for as needed for new, worsening, persistent symptoms.

## 2022-01-05 ENCOUNTER — Encounter: Payer: Self-pay | Admitting: Family Medicine

## 2022-01-05 ENCOUNTER — Other Ambulatory Visit (HOSPITAL_BASED_OUTPATIENT_CLINIC_OR_DEPARTMENT_OTHER): Payer: Self-pay | Admitting: Family Medicine

## 2022-01-05 DIAGNOSIS — Z1231 Encounter for screening mammogram for malignant neoplasm of breast: Secondary | ICD-10-CM

## 2022-01-05 LAB — COMPREHENSIVE METABOLIC PANEL
ALT: 14 U/L (ref 0–35)
AST: 21 U/L (ref 0–37)
Albumin: 4 g/dL (ref 3.5–5.2)
Alkaline Phosphatase: 59 U/L (ref 39–117)
BUN: 28 mg/dL — ABNORMAL HIGH (ref 6–23)
CO2: 29 mEq/L (ref 19–32)
Calcium: 10.2 mg/dL (ref 8.4–10.5)
Chloride: 96 mEq/L (ref 96–112)
Creatinine, Ser: 1.53 mg/dL — ABNORMAL HIGH (ref 0.40–1.20)
GFR: 35.02 mL/min — ABNORMAL LOW (ref >60.00)
Glucose, Bld: 136 mg/dL — ABNORMAL HIGH (ref 70–99)
Potassium: 3.3 mEq/L — ABNORMAL LOW (ref 3.5–5.1)
Sodium: 135 mEq/L (ref 135–145)
Total Bilirubin: 1.3 mg/dL — ABNORMAL HIGH (ref 0.2–1.2)
Total Protein: 6.4 g/dL (ref 6.0–8.3)

## 2022-01-06 ENCOUNTER — Inpatient Hospital Stay (HOSPITAL_BASED_OUTPATIENT_CLINIC_OR_DEPARTMENT_OTHER): Admission: RE | Admit: 2022-01-06 | Payer: Medicare Other | Source: Ambulatory Visit | Admitting: Radiology

## 2022-01-06 DIAGNOSIS — Z1231 Encounter for screening mammogram for malignant neoplasm of breast: Secondary | ICD-10-CM

## 2022-01-10 ENCOUNTER — Ambulatory Visit (HOSPITAL_BASED_OUTPATIENT_CLINIC_OR_DEPARTMENT_OTHER)
Admission: RE | Admit: 2022-01-10 | Discharge: 2022-01-10 | Disposition: A | Discharge status: Home / Self Care | Payer: Medicare Other | Source: Ambulatory Visit | Attending: Family Medicine | Admitting: Family Medicine

## 2022-01-10 DIAGNOSIS — Z1231 Encounter for screening mammogram for malignant neoplasm of breast: Secondary | ICD-10-CM | POA: Diagnosis present

## 2022-01-12 NOTE — Addendum Note (Signed)
Addended by: Marin Olp on: 01/12/2022 03:00 PM   Modules accepted: Orders

## 2022-01-17 ENCOUNTER — Encounter: Payer: Self-pay | Admitting: Neurology

## 2022-02-03 ENCOUNTER — Telehealth: Payer: Self-pay

## 2022-02-03 NOTE — Progress Notes (Signed)
  Chronic Care Management   Note  02/03/2022 Name: [name] [name] MRN: [id number] DOB: 29-Apr-1955  [name] [name] is a 66 y.o. year old female who is a primary care patient of Marin Olp, MD. I reached out to [name] [name] by phone today in response to a referral sent by [name] [name] PCP.  [name] [name]  agreedto scheduling an appointment with the CCM RN Case Manager   Follow up plan: Patient agreed to scheduled appointment with RN Case Manager on 02/22/2022(date/time).   [name] [name], [place], [place] [address] Direct Dial: [phone] [name]

## 2022-02-03 NOTE — Progress Notes (Signed)
  Chronic Care Management   Note  02/03/2022 Name: [name] [name] MRN: [id number] DOB: 1955-12-15  [name] [name] is a 66 y.o. year old female who is a primary care patient of Marin Olp, MD. I reached out to [name] by phone today in response to a referral sent by [name]'s PCP.  [name] was not successfully contacted today. A HIPAA compliant voice message was left requesting a return call.   Follow up plan: Additional outreach attempts will be made.  Noreene Larsson, [place], [place] [address] Direct Dial: [phone] [email or web address]

## 2022-02-11 LAB — HM DIABETES EYE EXAM

## 2022-02-22 ENCOUNTER — Ambulatory Visit (INDEPENDENT_AMBULATORY_CARE_PROVIDER_SITE_OTHER): Payer: Medicare Other | Admitting: *Deleted

## 2022-02-22 DIAGNOSIS — E1165 Type 2 diabetes mellitus with hyperglycemia: Secondary | ICD-10-CM

## 2022-02-22 DIAGNOSIS — I5032 Chronic diastolic (congestive) heart failure: Secondary | ICD-10-CM

## 2022-02-22 NOTE — Plan of Care (Signed)
Chronic Care Management Provider Comprehensive Care Plan    02/22/2022 Name: Indya Oliveria MRN: [id number] DOB: [birth year]  Referral to Chronic Care Management (CCM) services was placed by Provider:  [name] MD on Date: 01/12/22.  Chronic Condition 1: CONGESTIVE HEART FAILURE Provider Assessment and Plan #hypertension/CHF diastolic S: medication: NONE- Prior benazepril but was likely stopped due to low blood pressures.  Was on Bumex 1 mg daily now just as needed- has not needed Home readings #s: ranging from 91-110/50-75   -Weight stable from last visit- mild fluctuations within 2 lbs each direction from 200 no increased edema   - does not feel poorly including lightheadedness on lower blood pressure unless stands up quickly   Expected Outcome/Goals Addressed This Visit (Provider CCM goals/Provider Assessment and plan  CCM ([place]) EXPECTED OUTCOME: MONITOR, SELF-MANAGE AND REDUCE SYMPTOMS OF CONGESTIVE HEART FAILURE  Symptom Management Condition 1: Take medications as prescribed   Attend all scheduled provider appointments Call pharmacy for medication refills 3-7 days in advance of running out of medications Attend church or other social activities Perform all self care activities independently  Perform IADL's (shopping, preparing meals, housekeeping, managing finances) independently Call provider office for new concerns or questions  call office if I gain more than 2 pounds in one day or 5 pounds in one week keep legs up while sitting use salt in moderation watch for swelling in feet, ankles and legs every day weigh myself daily develop a rescue plan follow rescue plan if symptoms flare-up eat more whole grains, fruits and vegetables, lean meats and healthy fats know when to call the doctor- when in the yellow zone (Heart failure action plan) dress right for the weather, hot or cold Look over education via my chart- heart failure action plan  Chronic  Condition 2: DIABETES Provider Assessment and Plan # Diabetes S: Medication:Jardiance 10 mg, metformin 1000 mg in the morning and 1000 mg at night planned but renal function worsened and back down to 540m twice daily -mild flank tenderness CBGs- sugars improved drastically down to 128-151 range in the mornings   Expected Outcome/Goals Addressed This Visit (Provider CCM goals/Provider Assessment and plan  CCM ([place]) EXPECTED OUTCOME:  MONITOR, SELF-MANAGE AND REDUCE SYMPTOMS OF DIABETES  Symptom Management Condition 2: Take medications as prescribed   Attend all scheduled provider appointments Call pharmacy for medication refills 3-7 days in advance of running out of medications Attend church or other social activities Perform all self care activities independently  Perform IADL's (shopping, preparing meals, housekeeping, managing finances) independently Call provider office for new concerns or questions  check blood sugar at prescribed times: once daily check feet daily for cuts, sores or redness enter blood sugar readings and medication or insulin into daily log take the blood sugar log to all doctor visits take the blood sugar meter to all doctor visits trim toenails straight across fill half of plate with vegetables limit fast food meals to no more than 1 per week manage portion size prepare main meal at home 3 to 5 days each week Look over education sent via my chart- hypoglycemia Continue walking as much as you can, weather permitting  Problem List Patient Active Problem List   Diagnosis Date Noted   Nonrheumatic aortic valve stenosis 12/25/2021   Dyslipidemia 12/25/2021   (HFpEF) heart [place] 12/13/2021   History of non-ST elevation myocardial infarction (NSTEMI) 12/13/2021   Major depressive disorder with single episode, in full remission ([place] 06/24/2021  S/P cervical spinal fusion 01/18/2021   S/P revision of total knee,  left 08/15/2019   Osteoarthritis of ankle, right 01/29/2018   Sleep related headaches 09/07/2016   Nightmares REM-sleep type 09/07/2016   RLS (restless legs syndrome) 09/07/2016   Snoring 09/07/2016   Excessive daytime sleepiness 09/07/2016   Migraine 07/28/2016   Solitary pulmonary nodule 06/08/2016   S/P knee replacement 05/05/2015   Former smoker 07/10/2014   Shortness of breath 06/07/2014   Osteoarthritis 01/75/1025   Alcoholic fatty liver 85/27/7824   Chronic post-traumatic stress disorder (PTSD) 05/02/2014   Sleep disorder 05/02/2014   Family history of alcoholism 05/02/2014   Macrocytosis 04/19/2014   Fatigue 08/15/2013   Thyroid nodule 09/17/2012   Hypothyroid 07/21/2010   Type II diabetes mellitus, well controlled ([place]) 06/23/2010   Essential hypertension 06/23/2010   Alcohol use disorder, severe, in sustained remission ([place]) 06/23/2010   Hyperlipidemia associated with type 2 diabetes mellitus ([place]) 06/23/2010   Chronic depression 06/23/2010   History of colonic polyps 04/12/2010    Medication Management  Current Outpatient Medications:    Blood Glucose Monitoring Suppl (ONETOUCH VERIO) w/Device KIT, Use to test blood sugar daily, Disp: 1 kit, Rfl: 0   buPROPion (WELLBUTRIN) 75 MG tablet, Take 0.5 tablets (37.5 mg total) by mouth 2 (two) times daily., Disp: 90 tablet, Rfl: 3   Cholecalciferol (VITAMIN D HIGH POTENCY) 25 MCG (1000 UT) capsule, Take 1,000 Units by mouth daily., Disp: , Rfl:    CRANBERRY PO, Take 1 tablet by mouth daily., Disp: , Rfl:    empagliflozin (JARDIANCE) 10 MG TABS tablet, Take 1 tablet (10 mg total) by mouth daily before breakfast., Disp: 90 tablet, Rfl: 3   escitalopram (LEXAPRO) 20 MG tablet, Take 1 tablet (20 mg total) by mouth daily., Disp: 90 tablet, Rfl: 3   glucose blood (ONETOUCH VERIO) test strip, USE TO CHECK BLOOD SUGAR DAILY, Disp: 100 each, Rfl: 12   levothyroxine (SYNTHROID) 75 MCG tablet, Take 1 tablet (75 mcg total) by mouth  daily., Disp: 90 tablet, Rfl: 3   metFORMIN (GLUCOPHAGE-XR) 500 MG 24 hr tablet, Take 1 tablet (500 mg total) by mouth in the morning and at bedtime., Disp: 180 tablet, Rfl: 3   OneTouch Delica Lancets 23N MISC, Use to test blood sugar daily, Disp: 100 each, Rfl: 5   rosuvastatin (CRESTOR) 20 MG tablet, Take 1 tablet (20 mg total) by mouth daily., Disp: 90 tablet, Rfl: 3   tiZANidine (ZANAFLEX) 4 MG tablet, Take 1 tablet (4 mg total) by mouth 3 (three) times daily as needed for muscle spasms., Disp: 270 tablet, Rfl: 1   traZODone (DESYREL) 50 MG tablet, Take 1 tablet (50 mg total) by mouth at bedtime., Disp: 90 tablet, Rfl: 3   bumetanide (BUMEX) 1 MG tablet, Take 1 mg by mouth daily. (Patient not taking: Reported on 02/22/2022), Disp: , Rfl:    methocarbamol (ROBAXIN) 500 MG tablet, Take 500 mg by mouth every 8 (eight) hours as needed for muscle spasms. (Patient not taking: Reported on 02/22/2022), Disp: , Rfl:    potassium chloride SA (KLOR-CON M) 20 MEQ tablet, Take 1 tablet (20 mEq total) by mouth daily. (Patient not taking: Reported on 02/22/2022), Disp: 90 tablet, Rfl: 0  Cognitive Assessment Identity Confirmed: : Name Cognitive Status: Normal   Functional Assessment Hearing Difficulty or Deaf: yes Hearing Management: bil hearing aides Wear Glasses or Blind: yes Vision Management: can see well w/ glases Concentrating, Remembering or Making Decisions Difficulty (CP): no Difficulty Communicating: no  Difficulty Eating/Swallowing: no Walking or Climbing Stairs Difficulty: no Dressing/Bathing Difficulty: no Doing Errands Independently Difficulty (such as shopping) (CP): no   Caregiver Assessment  Primary Source of Support/Comfort: spouse Name of Support/Comfort Primary Source: spouse Jasamine Pottinger People in Home: spouse Name(s) of People in Home: Eron Goble spouse   Planned Interventions  Basic overview and discussion of pathophysiology of Heart Failure reviewed Provided education  on low sodium diet Reviewed Heart Failure Action Plan in depth and provided written copy Assessed need for readable accurate scales in home Provided education about placing scale on hard, flat surface Advised patient to weigh each morning after emptying bladder Discussed importance of daily weight and advised patient to weigh and record daily Discussed the importance of keeping all appointments with provider Screening for signs and symptoms of depression related to chronic disease state  Assessed social determinant of health barriers Provided education to patient about basic DM disease process; Reviewed medications with patient and discussed importance of medication adherence;        Reviewed prescribed diet with patient carbohydrate modified, plate method; Counseled on importance of regular laboratory monitoring as prescribed;        Discussed plans with patient for ongoing care management follow up and provided patient with direct contact information for care management team;      Provided patient with written educational materials related to hypo and hyperglycemia and importance of correct treatment;       Advised patient, providing education and rationale, to check cbg once daily and record        Review of patient status, including review of consultants reports, relevant laboratory and other test results, and medications completed;       Advised patient to discuss any issues with blood sugar with provider;      Screening for signs and symptoms of depression related to chronic disease state;        Assessed social determinant of health barriers;         Interaction and coordination with outside resources, practitioners, and providers See CCM Referral  Care Plan: Available in MyChart

## 2022-02-22 NOTE — Chronic Care Management (AMB) (Signed)
Chronic Care Management   CCM RN Visit Note  02/22/2022 Name: [name] [name] MRN: [id number] DOB: May 27, 1955  Subjective: [name] [name] is a 67 y.o. year old female who is a primary care patient of Marin Olp, MD. The patient was referred to the Chronic Care Management team for assistance with care management needs subsequent to provider initiation of CCM services and plan of care.    Today's Visit:  Engaged with patient by telephone for initial visit.     SDOH Interventions Today    Flowsheet Row Most Recent Value  SDOH Interventions   Food Insecurity Interventions Intervention Not Indicated  Housing Interventions Intervention Not Indicated  Transportation Interventions Intervention Not Indicated  Utilities Interventions Intervention Not Indicated  Financial Strain Interventions Intervention Not Indicated  Physical Activity Interventions Patient Refused, Intervention Not Indicated  Stress Interventions Intervention Not Indicated  Social Connections Interventions Patient Refused         Goals Addressed             This Visit's Progress    CCM (CONGESTIVE HEART FAILURE) EXPECTED OUTCOME: MONITOR, SELF-MANAGE AND REDUCE SYMPTOMS OF CONGESTIVE HEART FAILURE       Current Barriers:  Knowledge Deficits related to Congestive Heart Failure management Chronic Disease Management support and education needs related to Congestive Heart Failure, diet Patient reports she has scale and weighs daily and records Patient reports she is taking medications as prescribed  Planned Interventions: Basic overview and discussion of pathophysiology of Heart Failure reviewed Provided education on low sodium diet Reviewed Heart Failure Action Plan in depth and provided written copy Assessed need for readable accurate scales in home Provided education about placing scale on hard, flat surface Advised patient to weigh each morning after emptying bladder Discussed importance of daily weight  and advised patient to weigh and record daily Discussed the importance of keeping all appointments with provider Screening for signs and symptoms of depression related to chronic disease state  Assessed social determinant of health barriers  Symptom Management: Take medications as prescribed   Attend all scheduled provider appointments Call pharmacy for medication refills 3-7 days in advance of running out of medications Attend church or other social activities Perform all self care activities independently  Perform IADL's (shopping, preparing meals, housekeeping, managing finances) independently Call provider office for new concerns or questions  call office if I gain more than 2 pounds in one day or 5 pounds in one week keep legs up while sitting use salt in moderation watch for swelling in feet, ankles and legs every day weigh myself daily develop a rescue plan follow rescue plan if symptoms flare-up eat more whole grains, fruits and vegetables, lean meats and healthy fats know when to call the doctor- when in the yellow zone (Heart failure action plan) dress right for the weather, hot or cold Look over education via my chart- heart failure action plan  Follow Up Plan: Telephone follow up appointment with care management team member scheduled for:  04/11/22 at 130 pm       CCM (DIABETES) EXPECTED OUTCOME:  MONITOR, SELF-MANAGE AND REDUCE SYMPTOMS OF DIABETES       Current Barriers:  Knowledge Deficits related to Diabetes management Chronic Disease Management support and education needs related to Diabetes, diet, working towards decreasing [facility] Patient reports she lives with spouse [name] [name], states she is independent with all aspects of her care, continues to drive, tries to walk 2-3 times per week, does not drink sugary drinks, only water. Patient states  she checks CBG once daily fasting and ranges 120's  Planned Interventions: Provided education to patient about basic DM disease  process; Reviewed medications with patient and discussed importance of medication adherence;        Reviewed prescribed diet with patient carbohydrate modified, plate method; Counseled on importance of regular laboratory monitoring as prescribed;        Discussed plans with patient for ongoing care management follow up and provided patient with direct contact information for care management team;      Provided patient with written educational materials related to hypo and hyperglycemia and importance of correct treatment;       Advised patient, providing education and rationale, to check cbg once daily and record        Review of patient status, including review of consultants reports, relevant laboratory and other test results, and medications completed;       Advised patient to discuss any issues with blood sugar with provider;      Screening for signs and symptoms of depression related to chronic disease state;        Assessed social determinant of health barriers;         Symptom Management: Take medications as prescribed   Attend all scheduled provider appointments Call pharmacy for medication refills 3-7 days in advance of running out of medications Attend church or other social activities Perform all self care activities independently  Perform IADL's (shopping, preparing meals, housekeeping, managing finances) independently Call provider office for new concerns or questions  check blood sugar at prescribed times: once daily check feet daily for cuts, sores or redness enter blood sugar readings and medication or insulin into daily log take the blood sugar log to all doctor visits take the blood sugar meter to all doctor visits trim toenails straight across fill half of plate with vegetables limit fast food meals to no more than 1 per week manage portion size prepare main meal at home 3 to 5 days each week Look over education sent via my chart- hypoglycemia Continue walking as  much as you can, weather permitting  Follow Up Plan: Telephone follow up appointment with care management team member scheduled for:  04/11/22 at 130 pm          Plan:Telephone follow up appointment with care management team member scheduled for:  04/11/22 at 130 pm  [name] [facility], BSN RN Case Manager [place] at [employer] [phone]

## 2022-02-22 NOTE — Patient Instructions (Addendum)
Please call the care guide team at [phone] if you need to cancel or reschedule your appointment.   If you are experiencing a Mental Health or [place] or need someone to talk to, please call the Suicide and Crisis Lifeline: 988 call the [place] National Suicide Prevention Lifeline: 314-253-8788 or TTY: 432 068 4964 TTY (213)658-0257) to talk to a trained counselor call 1-800-273-TALK (toll free, 24 hour hotline) go to [facility] [address] [place] [phone]) call 911   Following is a copy of the CCM Program Consent:  CCM service includes personalized support from designated clinical staff supervised by the physician, including individualized plan of care and coordination with other care providers 24/7 contact phone numbers for assistance for urgent and routine care needs. Service will only be billed when office clinical staff spend 20 minutes or more in a month to coordinate care. Only one practitioner may furnish and bill the service in a calendar month. The patient may stop CCM services at amy time (effective at the end of the month) by phone call to the office staff. The patient will be responsible for cost sharing (co-pay) or up to 20% of the service fee (after annual deductible is met)  Following is a copy of your full provider care plan:   Goals Addressed             This Visit's Progress    CCM ([place]) EXPECTED OUTCOME: MONITOR, SELF-MANAGE AND REDUCE SYMPTOMS OF CONGESTIVE HEART FAILURE       Current Barriers:  Knowledge Deficits related to Congestive Heart Failure management Chronic Disease Management support and education needs related to Congestive Heart Failure, diet Patient reports she has scale and weighs daily and records Patient reports she is taking medications as prescribed  Planned Interventions: Basic overview and discussion of pathophysiology of Heart Failure reviewed Provided  education on low sodium diet Reviewed Heart Failure Action Plan in depth and provided written copy Assessed need for readable accurate scales in home Provided education about placing scale on hard, flat surface Advised patient to weigh each morning after emptying bladder Discussed importance of daily weight and advised patient to weigh and record daily Discussed the importance of keeping all appointments with provider Screening for signs and symptoms of depression related to chronic disease state  Assessed social determinant of health barriers  Symptom Management: Take medications as prescribed   Attend all scheduled provider appointments Call pharmacy for medication refills 3-7 days in advance of running out of medications Attend church or other social activities Perform all self care activities independently  Perform IADL's (shopping, preparing meals, housekeeping, managing finances) independently Call provider office for new concerns or questions  call office if I gain more than 2 pounds in one day or 5 pounds in one week keep legs up while sitting use salt in moderation watch for swelling in feet, ankles and legs every day weigh myself daily develop a rescue plan follow rescue plan if symptoms flare-up eat more whole grains, fruits and vegetables, lean meats and healthy fats know when to call the doctor- when in the yellow zone (Heart failure action plan) dress right for the weather, hot or cold Look over education via my chart- heart failure action plan  Follow Up Plan: Telephone follow up appointment with care management team member scheduled for:  04/11/22 at 130 pm       CCM (DIABETES) EXPECTED OUTCOME:  MONITOR, SELF-MANAGE AND REDUCE SYMPTOMS OF DIABETES  Current Barriers:  Knowledge Deficits related to Diabetes management Chronic Disease Management support and education needs related to Diabetes, diet, working towards decreasing [facility] Patient reports she lives with  spouse [name], states she is independent with all aspects of her care, continues to drive, tries to walk 2-3 times per week, does not drink sugary drinks, only water. Patient states she checks CBG once daily fasting and ranges 120's  Planned Interventions: Provided education to patient about basic DM disease process; Reviewed medications with patient and discussed importance of medication adherence;        Reviewed prescribed diet with patient carbohydrate modified, plate method; Counseled on importance of regular laboratory monitoring as prescribed;        Discussed plans with patient for ongoing care management follow up and provided patient with direct contact information for care management team;      Provided patient with written educational materials related to hypo and hyperglycemia and importance of correct treatment;       Advised patient, providing education and rationale, to check cbg once daily and record        Review of patient status, including review of consultants reports, relevant laboratory and other test results, and medications completed;       Advised patient to discuss any issues with blood sugar with provider;      Screening for signs and symptoms of depression related to chronic disease state;        Assessed social determinant of health barriers;         Symptom Management: Take medications as prescribed   Attend all scheduled provider appointments Call pharmacy for medication refills 3-7 days in advance of running out of medications Attend church or other social activities Perform all self care activities independently  Perform IADL's (shopping, preparing meals, housekeeping, managing finances) independently Call provider office for new concerns or questions  check blood sugar at prescribed times: once daily check feet daily for cuts, sores or redness enter blood sugar readings and medication or insulin into daily log take the blood sugar log to all doctor  visits take the blood sugar meter to all doctor visits trim toenails straight across fill half of plate with vegetables limit fast food meals to no more than 1 per week manage portion size prepare main meal at home 3 to 5 days each week Look over education sent via my chart- hypoglycemia Continue walking as much as you can, weather permitting  Follow Up Plan: Telephone follow up appointment with care management team member scheduled for:  04/11/22 at 130 pm          Patient verbalizes understanding of instructions and care plan provided today and agrees to view in [place]. Active MyChart status and patient understanding of how to access instructions and care plan via MyChart confirmed with patient.     Telephone follow up appointment with care management team member scheduled for:  04/11/22 at 130 pm  Heart Failure Action Plan A heart failure action plan helps you understand what to do when you have symptoms of heart failure. Your action plan is a color-coded plan that lists the symptoms to watch for and indicates what actions to take. If you have symptoms in the red zone, you need medical care right away. If you have symptoms in the yellow zone, you are having problems. If you have symptoms in the green zone, you are doing well. Follow the plan that was created by you and your health care provider. Review  your plan each time you visit your health care provider. Red zone These signs and symptoms mean you should get medical help right away: You have trouble breathing when resting. You have a dry cough that is getting worse. You have swelling or pain in your legs or abdomen that is getting worse. You suddenly gain more than 2-3 lb (0.9-1.4 kg) in 24 hours, or more than 5 lb (2.3 kg) in a week. This amount may be more or less depending on your condition. You have trouble staying awake or you feel confused. You have chest pain. You do not have an appetite. You pass out. You have  worsening sadness or depression. If you have any of these symptoms, call your local emergency services (911 in the [place] right away. Do not drive yourself to the hospital. Yellow zone These signs and symptoms mean your condition may be getting worse and you should make some changes: You have trouble breathing when you are active, or you need to sleep with your head raised on extra pillows to help you breathe. You have swelling in your legs or abdomen. You gain 2-3 lb (0.9-1.4 kg) in 24 hours, or 5 lb (2.3 kg) in a week. This amount may be more or less depending on your condition. You get tired easily. You have trouble sleeping. You have a dry cough. If you have any of these symptoms: Contact your health care provider within the next day. Your health care provider may adjust your medicines. Green zone These signs mean you are doing well and can continue what you are doing: You do not have shortness of breath. You have very little swelling or no new swelling. Your weight is stable (no gain or loss). You have a normal activity level. You do not have chest pain or any other new symptoms. Follow these instructions at home: Take over-the-counter and prescription medicines only as told by your health care provider. Weigh yourself daily. Your target weight is __________ lb (__________ kg). Call your health care provider if you gain more than __________ lb (__________ kg) in 24 hours, or more than __________ lb (__________ kg) in a week. Health care provider name: _____________________________________________________ Health care provider phone number: _____________________________________________________ Eat a heart-healthy diet. Work with a diet and nutrition specialist (dietitian) to create an eating plan that is best for you. Keep all follow-up visits. This is important. Where to find more information American Heart Association: Summary A heart failure action plan helps you understand what  to do when you have symptoms of heart failure. Follow the action plan that was created by you and your health care provider. Get help right away if you have any symptoms in the red zone. This information is not intended to replace advice given to you by your health care provider. Make sure you discuss any questions you have with your health care provider. Document Revised: 05/18/2021 Document Reviewed: 09/23/2019 Elsevier Patient Education  [place]. Hypoglycemia Hypoglycemia is when the sugar (glucose) level in your blood is too low. Low blood sugar can happen to people who have diabetes and people who do not have diabetes. Low blood sugar can happen quickly, and it can be an emergency. What are the causes? This condition happens most often in people who have diabetes. It may be caused by: Diabetes medicine. Not eating enough, or not eating often enough. Doing more physical activity. Drinking alcohol on an empty stomach. If you do not have diabetes, this condition may be caused by:  A tumor in the pancreas. Not eating enough, or not eating for long periods at a time (fasting). A very bad infection or illness. Problems after having weight loss (bariatric) surgery. Kidney failure or liver failure. Certain medicines. What increases the risk? This condition is more likely to develop in people who: Have diabetes and take medicines to lower their blood sugar. Abuse alcohol. Have a very bad illness. What are the signs or symptoms? Mild Hunger. Sweating and feeling clammy. Feeling dizzy or light-headed. Being sleepy or having trouble sleeping. Feeling like you may vomit (nauseous). A fast heartbeat. A headache. Blurry vision. Mood changes, such as: Being grouchy. Feeling worried or nervous (anxious). Tingling or loss of feeling (numbness) around your mouth, lips, or tongue. Moderate Confusion and poor judgment. Behavior changes. Weakness. Uneven heartbeat. Trouble with  moving (coordination). Very low Very low blood sugar (severe hypoglycemia) is a medical emergency. It can cause: Fainting. Seizures. Loss of consciousness (coma). Death. How is this treated? Treating low blood sugar Low blood sugar is often treated by eating or drinking something that has sugar in it right away. The food or drink should contain 15 grams of a fast-acting carb (carbohydrate). Options include: 4 oz (120 mL) of fruit juice. 4 oz (120 mL) of regular soda (not diet soda). A few pieces of hard candy. Check food labels to see how many pieces to eat for 15 grams. 1 Tbsp (15 mL) of sugar or honey. 4 glucose tablets. 1 tube of glucose gel. Treating low blood sugar if you have diabetes If you can think clearly and swallow safely, follow the 15:15 rule: Take 15 grams of a fast-acting carb. Talk with your doctor about how much you should take. Always keep a source of fast-acting carb with you, such as: Glucose tablets (take 4 tablets). A few pieces of hard candy. Check food labels to see how many pieces to eat for 15 grams. 4 oz (120 mL) of fruit juice. 4 oz (120 mL) of regular soda (not diet soda). 1 Tbsp (15 mL) of honey or sugar. 1 tube of glucose gel. Check your blood sugar 15 minutes after you take the carb. If your blood sugar is still at or below 70 mg/dL (3.9 mmol/L), take 15 grams of a carb again. If your blood sugar does not go above 70 mg/dL (3.9 mmol/L) after 3 tries, get help right away. After your blood sugar goes back to normal, eat a meal or a snack within 1 hour.  Treating very low blood sugar If your blood sugar is below 54 mg/dL (3 mmol/L), you have very low blood sugar, or severe hypoglycemia. This is an emergency. Get medical help right away. If you have very low blood sugar and you cannot eat or drink, you will need to be given a hormone called glucagon. A family member or friend should learn how to check your blood sugar and how to give you glucagon. Ask  your doctor if you need to have an emergency glucagon kit at home. Very low blood sugar may also need to be treated in a hospital. Follow these instructions at home: General instructions Take over-the-counter and prescription medicines only as told by your doctor. Stay aware of your blood sugar as told by your doctor. If you drink alcohol: Limit how much you have to: 0-1 drink a day for women who are not pregnant. 0-2 drinks a day for men. Know how much alcohol is in your drink. In the [place] one drink equals one  12 oz bottle of beer (355 mL), one 5 oz glass of wine (148 mL), or one 1 oz glass of hard liquor (44 mL). Be sure to eat food when you drink alcohol. Know that your body absorbs alcohol quickly. This may lead to low blood sugar later. Be sure to keep checking your blood sugar. Keep all follow-up visits. If you have diabetes:  Always have a fast-acting carb (15 grams) with you to treat low blood sugar. Follow your diabetes care plan as told by your doctor. Make sure you: Know the symptoms of low blood sugar. Check your blood sugar as often as told. Always check it before and after exercise. Always check your blood sugar before you drive. Take your medicines as told. Follow your meal plan. Eat on time. Do not skip meals. Share your diabetes care plan with: Your work or school. People you live with. Carry a card or wear jewelry that says you have diabetes. Where to find more information American Diabetes Association: www.diabetes.org Contact a doctor if: You have trouble keeping your blood sugar in your target range. You have low blood sugar often. Get help right away if: You still have symptoms after you eat or drink something that contains 15 grams of fast-acting carb, and you cannot get your blood sugar above 70 mg/dL by following the 15:15 rule. Your blood sugar is below 54 mg/dL (3 mmol/L). You have a seizure. You faint. These symptoms may be an emergency. Get help  right away. Call your local emergency services (911 in the [place] Do not wait to see if the symptoms will go away. Do not drive yourself to the hospital. Summary Hypoglycemia happens when the level of sugar (glucose) in your blood is too low. Low blood sugar can happen to people who have diabetes and people who do not have diabetes. Low blood sugar can happen quickly, and it can be an emergency. Make sure you know the symptoms of low blood sugar and know how to treat it. Always keep a source of sugar (fast-acting carb) with you to treat low blood sugar. This information is not intended to replace advice given to you by your health care provider. Make sure you discuss any questions you have with your health care provider. Document Revised: 01/09/2020 Document Reviewed: 01/09/2020 Elsevier Patient Education  [place].

## 2022-02-28 ENCOUNTER — Telehealth: Payer: Medicare Other

## 2022-03-01 ENCOUNTER — Other Ambulatory Visit: Payer: Self-pay

## 2022-03-01 MED ORDER — TRAZODONE HCL 50 MG PO TABS: 50.0000 mg | ORAL_TABLET | Freq: Every day | ORAL | 3 refills | Status: AC

## 2022-03-02 ENCOUNTER — Ambulatory Visit
Admission: RE | Admit: 2022-03-02 | Discharge: 2022-03-02 | Disposition: A | Discharge status: Home / Self Care | Payer: Medicare Other | Source: Ambulatory Visit | Attending: Internal Medicine | Admitting: Internal Medicine

## 2022-03-02 DIAGNOSIS — Z87891 Personal history of nicotine dependence: Secondary | ICD-10-CM

## 2022-03-02 NOTE — Progress Notes (Unsigned)
Care Management & Coordination Services Pharmacy Note  03/02/2022 Name:  [name] [name] MRN:  [id number] DOB:  1955-06-29  Summary: ***  Recommendations/Changes made from today's visit: ***  Follow up plan: ***   Subjective: [name] [name] is an 67 y.o. year old female who is a primary patient of Yong Channel, Brayton Mars, MD.  The care coordination team was consulted for assistance with disease management and care coordination needs.    {CCMTELEPHONEFACETOFACE:[id number]} for follow up visit.  Patient Care Team: Marin Olp, MD as PCP - General (Family Medicine) Minus Breeding, MD as PCP - Cardiology (Cardiology) [facility] as Consulting Physician Paralee Cancel, MD as Consulting Physician (Orthopedic Surgery) Melina Schools, MD as Consulting Physician (Orthopedic Surgery) Roseanne Kaufman, MD as Consulting Physician (Orthopedic Surgery) Edythe Clarity, [facility] as Pharmacist (Pharmacist) Kassie Mends, RN as [place] Management  Recent office visits:  None   Recent consult visits:  03/01/2021 VV (Pulmonology) Martyn Ehrich, NP; no medication changes indicated.   Hospital visits:  01/18/2021 Admission for Elective Surgery ACDF C5-6, C6-7 Admit date: 01/18/2021 Discharge date: 01/19/2021 -STOP taking Bactrim DS  Objective:  Lab Results  Component Value Date   CREATININE 1.53 (H) 01/04/2022   BUN 28 (H) 01/04/2022   GFR 35.02 (L) 01/04/2022   EGFR 65 12/10/2020   GFRNONAA >60 01/11/2021   GFRAA 75 04/08/2020   NA 135 01/04/2022   K 3.3 (L) 01/04/2022   CALCIUM 10.2 01/04/2022   CO2 29 01/04/2022   GLUCOSE 136 (H) 01/04/2022    Lab Results  Component Value Date/Time   HGBA1C 9.8 (H) 12/13/2021 04:07 PM   HGBA1C 5.5 06/21/2021 09:46 AM   HGBA1C 6.1 12/04/2019 12:00 AM   HGBA1C 5.3 11/06/2017 12:00 AM   GFR 35.02 (L) 01/04/2022 03:46 PM   GFR 36.75 (L) 12/21/2021 12:47 PM   MICROALBUR 0.8 12/13/2021 04:07 PM    MICROALBUR 3.6 (H) 01/08/2015 09:08 AM    Last diabetic Eye exam:  Lab Results  Component Value Date/Time   HMDIABEYEEXA No Retinopathy 02/11/2022 12:00 AM    Last diabetic Foot exam: No results found for: "HMDIABFOOTEX"   Lab Results  Component Value Date   CHOL 122 06/21/2021   HDL 55.10 06/21/2021   LDLCALC 53 06/21/2021   LDLDIRECT 158.6 02/26/2014   TRIG 69.0 06/21/2021   CHOLHDL 2 06/21/2021       Latest Ref Rng & Units 01/04/2022    3:46 PM 12/21/2021   12:47 PM 12/13/2021    4:07 PM  Hepatic Function  Total Protein 6.0 - 8.3 g/dL 6.4  6.9  7.3   Albumin 3.5 - 5.2 g/dL 4.0  4.1  4.2   AST 0 - 37 U/L '21  20  29   '$ ALT 0 - 35 U/L '14  14  17   '$ Alk Phosphatase 39 - 117 U/L 59  64  75   Total Bilirubin 0.2 - 1.2 mg/dL 1.3  1.3  1.7     Lab Results  Component Value Date/Time   TSH 3.91 12/13/2021 04:07 PM   TSH 1.65 06/21/2021 09:46 AM   FREET4 1.40 08/15/2013 04:28 PM   FREET4 1.54 04/18/2011 02:50 PM       Latest Ref Rng & Units 12/21/2021   12:47 PM 12/13/2021    4:07 PM 06/21/2021    9:46 AM  CBC  WBC 4.0 - 10.5 K/uL 6.6  5.3  5.8   Hemoglobin 12.0 - 15.0 g/dL 13.9  14.5  13.3   Hematocrit 36.0 - 46.0 % 40.6  42.2  38.2   Platelets 150.0 - 400.0 K/uL 209.0  222.0  183.0     Lab Results  Component Value Date/Time   VITAMINB12 481 12/13/2021 04:07 PM   RDEYCXKG81 856 04/11/2014 01:56 PM    Clinical ASCVD: {YES/NO:21197} The ASCVD Risk score (Arnett DK, et al., 2019) failed to calculate for the following reasons:   The patient has a prior MI or stroke diagnosis       02/22/2022   12:40 PM 12/13/2021    2:21 PM 06/24/2021    1:25 PM  Depression screen PHQ 2/9  Decreased Interest 0 0 0  Down, Depressed, Hopeless 0 0 0  PHQ - 2 Score 0 0 0  Altered sleeping  0 0  Tired, decreased energy  0 0  Change in appetite  0 0  Feeling bad or failure about yourself   0 0  Trouble concentrating  0 0  Moving slowly or fidgety/restless  0 0  Suicidal thoughts   0 0  PHQ-9 Score  0 0  Difficult doing work/chores  Not difficult at all Not difficult at all     ***Other: (CHADS2VASc if Afib, MMRC or CAT for COPD, ACT, DEXA)  Social History   Tobacco Use  Smoking Status Former   Packs/day: 1.50   Years: 30.00   Total pack years: 45.00   Types: Cigarettes   Start date: 84   Quit date: 02/21/2009   Years since quitting: 13.0  Smokeless Tobacco Never   BP Readings from Last 3 Encounters:  01/04/22 (!) 108/58  12/27/21 90/62  12/13/21 122/74   Pulse Readings from Last 3 Encounters:  01/04/22 64  12/27/21 70  12/13/21 78   Wt Readings from Last 3 Encounters:  01/04/22 201 lb 12.8 oz (91.5 kg)  12/27/21 202 lb (91.6 kg)  12/13/21 202 lb (91.6 kg)   BMI Readings from Last 3 Encounters:  01/04/22 32.57 kg/m  12/27/21 32.60 kg/m  12/13/21 32.60 kg/m    Allergies  Allergen Reactions   Adhesive [Tape] Other (See Comments)    blisters   Nitrofurantoin     Diffuse rash   Other Other (See Comments)    (Neoprene)--causes blisters.     Medications Reviewed Today     Reviewed by Kassie Mends, RN (Registered Nurse) on 02/22/22 at 1244  Med List Status: <None>   Medication Order Taking? Sig Documenting Provider Last Dose Status Informant  Blood Glucose Monitoring Suppl [facility] VERIO) w/Device KIT [id number] Yes Use to test blood sugar daily Marin Olp, MD Taking Active   bumetanide (BUMEX) 1 MG tablet [id number] No Take 1 mg by mouth daily.  Patient not taking: Reported on 02/22/2022   [provider] Not Taking Active   buPROPion [facility]) 75 MG tablet [id number] Yes Take 0.5 tablets (37.5 mg total) by mouth 2 (two) times daily. Marin Olp, MD Taking Active   Cholecalciferol (VITAMIN D HIGH POTENCY) 25 MCG (1000 UT) capsule [id number] Yes Take 1,000 Units by mouth daily. [provider] Taking Active Self  CRANBERRY PO [id number] Yes Take 1 tablet by mouth daily. [provider] Taking  Active Self  empagliflozin (JARDIANCE) 10 MG TABS tablet [id number] Yes Take 1 tablet (10 mg total) by mouth daily before breakfast. Marin Olp, MD Taking Active   escitalopram (LEXAPRO) 20 MG tablet [id number] Yes Take 1 tablet (20 mg total) by mouth daily. Marin Olp,  MD Taking Active   glucose blood (ONETOUCH VERIO) test strip [id number] Yes USE TO CHECK BLOOD SUGAR DAILY Marin Olp, MD Taking Active   levothyroxine (SYNTHROID) 75 MCG tablet [id number] Yes Take 1 tablet (75 mcg total) by mouth daily. Marin Olp, MD Taking Active   metFORMIN (GLUCOPHAGE-XR) 500 MG 24 hr tablet [id number] Yes Take 1 tablet (500 mg total) by mouth in the morning and at bedtime. Marin Olp, MD Taking Active            Med Note Rosana Hoes, Ronald Lobo Dec 27, 2021  3:04 PM) Patient taking '1000mg'$  BID  methocarbamol (ROBAXIN) 500 MG tablet [id number] No Take 500 mg by mouth every 8 (eight) hours as needed for muscle spasms.  Patient not taking: Reported on 02/22/2022   [provider] Not Taking Active Self  OneTouch Delica Lancets 98X MISC [id number] Yes Use to test blood sugar daily Marin Olp, MD Taking Active   potassium chloride SA (KLOR-CON M) 20 MEQ tablet [id number] No Take 1 tablet (20 mEq total) by mouth daily.  Patient not taking: Reported on 02/22/2022   Marin Olp, MD Not Taking Active   rosuvastatin (CRESTOR) 20 MG tablet [id number] Yes Take 1 tablet (20 mg total) by mouth daily. Marin Olp, MD Taking Active   tiZANidine (ZANAFLEX) 4 MG tablet [id number] Yes Take 1 tablet (4 mg total) by mouth 3 (three) times daily as needed for muscle spasms. Marin Olp, MD Taking Active   traZODone (DESYREL) 50 MG tablet [id number] Yes Take 1 tablet (50 mg total) by mouth at bedtime. Marin Olp, MD Taking Active             Patient Active Problem List   Diagnosis Date Noted   Nonrheumatic aortic valve stenosis 12/25/2021   Dyslipidemia 12/25/2021    (HFpEF) heart failure with preserved ejection fraction ([place]) 12/13/2021   History of non-ST elevation myocardial infarction (NSTEMI) 12/13/2021   Major depressive disorder with single episode, in full remission ([place]) 06/24/2021   S/P cervical spinal fusion 01/18/2021   S/P revision of total knee, left 08/15/2019   Osteoarthritis of ankle, right 01/29/2018   Sleep related headaches 09/07/2016   Nightmares REM-sleep type 09/07/2016   RLS (restless legs syndrome) 09/07/2016   Snoring 09/07/2016   Excessive daytime sleepiness 09/07/2016   Migraine 07/28/2016   Solitary pulmonary nodule 06/08/2016   S/P knee replacement 05/05/2015   Former smoker 07/10/2014   Shortness of breath 06/07/2014   Osteoarthritis 67/20/9470   Alcoholic fatty liver 96/28/3662   Chronic post-traumatic stress disorder (PTSD) 05/02/2014   Sleep disorder 05/02/2014   Family history of alcoholism 05/02/2014   Macrocytosis 04/19/2014   Fatigue 08/15/2013   Thyroid nodule 09/17/2012   Hypothyroid 07/21/2010   Type II diabetes mellitus, well controlled ([place]) 06/23/2010   Essential hypertension 06/23/2010   Alcohol use disorder, severe, in sustained remission ([place]) 06/23/2010   Hyperlipidemia associated with type 2 diabetes mellitus ([place]) 06/23/2010   Chronic depression 06/23/2010   History of colonic polyps 04/12/2010    Immunization History  Administered Date(s) Administered   Fluad Quad(high Dose 65+) 12/09/2019, 12/14/2020   Influenza Split 01/06/2011   Influenza, High Dose Seasonal PF 11/05/2021   Influenza, Quadrivalent, Recombinant, Inj, Pf 11/02/2018   Influenza,inj,Quad PF,6+ Mos 12/14/2012, 11/21/2013, 11/06/2014, 11/28/2016, 11/13/2017   Influenza-Unspecified 11/27/2015   Moderna Covid-19 Vaccine Bivalent Booster 39yr & up 01/06/2021, 11/20/2021   Moderna Sars-Covid-2 Vaccination 04/17/2019, 05/15/2019, 12/30/2019, 05/26/2020  PNEUMOCOCCAL CONJUGATE-20 06/24/2021   Pneumococcal Conjugate-13  10/17/2013   Pneumococcal Polysaccharide-23 03/13/2015   Pneumococcal-Unspecified 02/21/2006   Respiratory Syncytial Virus Vaccine,Recomb Aduvanted(Arexvy) 11/05/2021   Tdap 06/06/2012   Zoster Recombinat (Shingrix) 02/08/2019, 05/30/2019   Zoster, Live 10/09/2014     Compliance/Adherence/Medication fill history: Care Gaps: ***  Star-Rating Drugs: ***  SDOH:  (Social Determinants of Health) assessments and interventions performed: {yes/no:20286} SDOH Interventions    Flowsheet Row Chronic Care Management from 02/22/2022 in [place] Visit from 12/14/2020 in [place] Visit from 06/19/2018 in [place] Visit from 11/13/2017 in [place] Visit from 06/28/2017 in [place] Interventions Intervention Not Indicated -- -- -- --  Housing Interventions Intervention Not Indicated -- -- -- --  Transportation Interventions Intervention Not Indicated -- -- -- --  Utilities Interventions Intervention Not Indicated -- -- -- --  Depression Interventions/Treatment  -- Medication Counseling Medication Medication, Currently on Treatment  Financial Strain Interventions Intervention Not Indicated -- -- -- --  Physical Activity Interventions Patient Refused, Intervention Not Indicated -- -- -- --  Stress Interventions Intervention Not Indicated -- -- -- --  Social Connections Interventions Patient Refused -- -- -- --      [place]: No Food Insecurity (02/22/2022)  Housing: Low Risk  (02/22/2022)  Transportation Needs: No Transportation Needs (02/22/2022)  Utilities: Not At Risk (02/22/2022)  Depression (PHQ2-9): Low Risk  (02/22/2022)  Financial Resource Strain: Low Risk  (02/22/2022)  Physical Activity: Insufficiently Active (02/22/2022)  Social Connections: Moderately Isolated (02/22/2022)   Stress: No Stress Concern Present (02/22/2022)  Tobacco Use: Medium Risk (02/22/2022)    Medication Assistance: {MEDASSISTANCEINFO:25044}  Medication Access: Within the past 30 days, how often has patient missed a dose of medication? *** Is a pillbox or other method used to improve adherence? {YES/NO:21197} Factors that may affect medication adherence? {CHL DESC; BARRIERS:21522} Are meds synced by current pharmacy? {YES/NO:21197} Are meds delivered by current pharmacy? {YES/NO:21197} Does patient experience delays in picking up medications due to transportation concerns? {YES/NO:21197}  [facility] Reviewed: Is patient disadvantaged to use [facility]?: {YES/NO:21197} Current Rx insurance plan: *** Name and location of Current pharmacy:  [facility] [address], [place] - [place] [address] [place] [place] [address] Phone: [phone] Fax: [phone]  [facility] services reviewed with patient today?: {YES/NO:21197} Patient requests to transfer care to [facility]?: {YES/NO:21197} Reason patient declined to change pharmacies: {US patient preference:27474}   Assessment/Plan     Hypertension (BP goal <130/80) 12/27/21 -Query Controlled -Current treatment: None -Medications previously tried: Benazepril '20mg'$   -Current home readings: has not been checking at home -Denies hypotensive/hypertensive symptoms -Educated on BP goals and benefits of medications for prevention of heart attack, stroke and kidney damage; Daily salt intake goal < 2300 mg; Importance of home blood pressure monitoring; -Counseled to monitor BP at home a few times per week, document, and provide log at future appointments -Recommended to continue current medication Patient had been taking Benazepril.  It appears that this was stopped in the hospital due to unknown reasons possibly to allow for diuresis.  She has cut back on her fluid pills at this time.   Will consult with CPP whether he wants her to continue. FU after consultation - patient will need to check her BP at home periodically.  Hyperlipidemia: (LDL goal < 70) -Controlled,  not assessed - patient needs refills -Current treatment: Rosuvastatin '20mg'$  Appropriate, Effective, Safe, Accessible -Medications previously tried: none noted  -Educated on Cholesterol goals;  Benefits of statin for ASCVD risk reduction; -Recommended to continue current medication LDL well controlled, tolerating well there is no need to make changes at this time.  Diabetes (A1c goal <7%) 12/27/21 -Uncontrolled, based on recent A1c of 9.8 -Current medications: Metformin '1000mg'$  XR two tablets twice daily Appropriate, Query Effective Jardiance '10mg'$  Appropriate, Query effective, ,  -Medications previously tried: none noted  -Current home glucose readings fasting glucose: 120 this morning post prandial glucose: unknown -Educated on A1c and blood sugar goals; Prevention and management of hypoglycemic episodes; Benefits of routine self-monitoring of blood sugar; -Counseled to check feet daily and get yearly eye exams -Recommended to continue current medication -We had cut back on her Metformin to XR '500mg'$  BID, she went to [place] for the summer and reports she started eating more sweets.  A1c increased, at this point Jardiance was started.  She says fasting sugars have improved and she has gone "sugar free." Fasting sugars have improved, she is tolerating well. Plans to follow with endocrine but cannot get in until April. Recheck A1c in 3 months and adjust meds as needed.  Continue to monitor renal function. Recommend reducing metformin dose back to '500mg'$  BID.  Depression/Anxiety (Goal: Reduce symptoms) -Not ideally controlled   -Current treatment: Escitalopram '20mg'$  Appropriate, Query effective, ,  Bupropion '75mg'$  one-half tablet twice daily Appropriate, Effective, Safe, Accessible -Medications previously  tried/failed: none noted -PHQ9:     04/23/2021    3:02 PM 12/14/2020    1:38 PM 07/28/2020   10:52 AM  PHQ9 SCORE ONLY  PHQ-9 Total Score 0 1 0   -GAD7:     06/28/2017   10:17 AM 11/28/2016    3:29 PM  GAD 7 : Generalized Anxiety Score  Nervous, Anxious, on Edge 0 1  Control/stop worrying 0 0  Worry too much - different things 0 1  Trouble relaxing 0 1  Restless 0 1  Easily annoyed or irritable 0 0  Afraid - awful might happen 0 0  Total GAD 7 Score 0 4  Anxiety Difficulty Not difficult at all Not difficult at all   After discussion and med review, it was discovered she did not have her Lexapro home and had not been taking for what appears to be 3 months.  She reports increased irritability and anger spells, -Educated on Benefits of medication for symptom control -Recommended she resume Lexapro, hopefully this will help her current symptoms. Prescription called in for '20mg'$  daily, she could start with 1/2 tab daily for the first 4 weeks to taper back up to old treatment dose.  Hypothryoidism (Goal: Maintain TSH) -Controlled -Current treatment  Levothyroxine 43mg daily Appropriate, Effective, Safe, Accessible -Medications previously tried: none noted  -Recommended to continue current medication Takes appropriately, TSH is WNL.  Patient Goals/Self-Care Activities Patient will:  - take medications as prescribed as evidenced by patient report and record review focus on medication adherence by pill counts check glucose daily, document, and provide at future appointments  Follow Up Plan: The care management team will reach out to the patient again over the next 6 months days.           CBeverly Milch PharmD Clinical Pharmacist  [facility]([phone]

## 2022-03-04 ENCOUNTER — Encounter: Payer: Medicare Other | Admitting: Pharmacist

## 2022-03-04 ENCOUNTER — Other Ambulatory Visit: Payer: Self-pay | Admitting: Acute Care

## 2022-03-04 DIAGNOSIS — Z122 Encounter for screening for malignant neoplasm of respiratory organs: Secondary | ICD-10-CM

## 2022-03-04 DIAGNOSIS — Z87891 Personal history of nicotine dependence: Secondary | ICD-10-CM

## 2022-03-21 ENCOUNTER — Ambulatory Visit: Payer: Medicare Other | Admitting: Family Medicine

## 2022-03-23 DIAGNOSIS — I5030 Unspecified diastolic (congestive) heart failure: Secondary | ICD-10-CM | POA: Diagnosis not present

## 2022-03-23 DIAGNOSIS — E1159 Type 2 diabetes mellitus with other circulatory complications: Secondary | ICD-10-CM

## 2022-03-30 ENCOUNTER — Telehealth: Payer: Self-pay

## 2022-03-30 NOTE — Progress Notes (Signed)
  Chronic Care Management Note  03/30/2022 Name: [name] [name] MRN: [id number] DOB: Oct 04, 1955  [name] [name] is a 67 y.o. year old female who is a primary care patient of Marin Olp, MD and is actively engaged with the Chronic Care Management team. I reached out to [name] by phone today to assist with re-scheduling a follow up visit with the RN Case Manager  Follow up plan: Unsuccessful telephone outreach attempt made. A HIPAA compliant phone message was left for the patient providing contact information and requesting a return call.  The care management team will reach out to the patient again over the next 7 days.  If patient returns call to provider office, please advise to call [facility]  at [place], [place], [place] [address] Direct Dial: [phone] [email or web address]

## 2022-03-31 ENCOUNTER — Encounter: Payer: Self-pay | Admitting: Family Medicine

## 2022-03-31 ENCOUNTER — Ambulatory Visit (INDEPENDENT_AMBULATORY_CARE_PROVIDER_SITE_OTHER): Payer: Medicare Other | Admitting: Family Medicine

## 2022-03-31 VITALS — BP 90/58 | HR 52 | Temp 97.1°F | Ht 66.0 in | Wt 199.0 lb

## 2022-03-31 DIAGNOSIS — E119 Type 2 diabetes mellitus without complications: Secondary | ICD-10-CM | POA: Diagnosis not present

## 2022-03-31 DIAGNOSIS — I5032 Chronic diastolic (congestive) heart failure: Secondary | ICD-10-CM | POA: Diagnosis not present

## 2022-03-31 DIAGNOSIS — R3589 Other polyuria: Secondary | ICD-10-CM | POA: Diagnosis not present

## 2022-03-31 DIAGNOSIS — K7030 Alcoholic cirrhosis of liver without ascites: Secondary | ICD-10-CM

## 2022-03-31 DIAGNOSIS — E785 Hyperlipidemia, unspecified: Secondary | ICD-10-CM

## 2022-03-31 DIAGNOSIS — E1165 Type 2 diabetes mellitus with hyperglycemia: Secondary | ICD-10-CM

## 2022-03-31 DIAGNOSIS — F1021 Alcohol dependence, in remission: Secondary | ICD-10-CM

## 2022-03-31 DIAGNOSIS — E1169 Type 2 diabetes mellitus with other specified complication: Secondary | ICD-10-CM

## 2022-03-31 DIAGNOSIS — F325 Major depressive disorder, single episode, in full remission: Secondary | ICD-10-CM

## 2022-03-31 LAB — CBC WITH DIFFERENTIAL/PLATELET
Basophils Absolute: 0 10*3/uL (ref 0.0–0.1)
Basophils Relative: 0.5 % (ref 0.0–3.0)
Eosinophils Absolute: 0.1 10*3/uL (ref 0.0–0.7)
Eosinophils Relative: 1.3 % (ref 0.0–5.0)
HCT: 41.1 % (ref 36.0–46.0)
Hemoglobin: 14.2 g/dL (ref 12.0–15.0)
Lymphocytes Relative: 46.2 % — ABNORMAL HIGH (ref 12.0–46.0)
Lymphs Abs: 2.4 10*3/uL (ref 0.7–4.0)
MCHC: 34.6 g/dL (ref 30.0–36.0)
MCV: 94.8 fl (ref 78.0–100.0)
Monocytes Absolute: 0.5 10*3/uL (ref 0.1–1.0)
Monocytes Relative: 10.3 % (ref 3.0–12.0)
Neutro Abs: 2.2 10*3/uL (ref 1.4–7.7)
Neutrophils Relative %: 41.7 % — ABNORMAL LOW (ref 43.0–77.0)
Platelets: 229 10*3/uL (ref 150.0–400.0)
RBC: 4.34 Mil/uL (ref 3.87–5.11)
RDW: 14.1 % (ref 11.5–15.5)
WBC: 5.3 10*3/uL (ref 4.0–10.5)

## 2022-03-31 LAB — COMPREHENSIVE METABOLIC PANEL
ALT: 15 U/L (ref 0–35)
AST: 24 U/L (ref 0–37)
Albumin: 3.7 g/dL (ref 3.5–5.2)
Alkaline Phosphatase: 57 U/L (ref 39–117)
BUN: 19 mg/dL (ref 6–23)
CO2: 25 mEq/L (ref 19–32)
Calcium: 9.9 mg/dL (ref 8.4–10.5)
Chloride: 103 mEq/L (ref 96–112)
Creatinine, Ser: 1 mg/dL (ref 0.40–1.20)
GFR: 58.25 mL/min — ABNORMAL LOW (ref >60.00)
Glucose, Bld: 105 mg/dL — ABNORMAL HIGH (ref 70–99)
Potassium: 4.1 mEq/L (ref 3.5–5.1)
Sodium: 137 mEq/L (ref 135–145)
Total Bilirubin: 0.9 mg/dL (ref 0.2–1.2)
Total Protein: 6 g/dL (ref 6.0–8.3)

## 2022-03-31 LAB — HEMOGLOBIN A1C: Hgb A1c MFr Bld: 5.9 % (ref 4.6–6.5)

## 2022-03-31 LAB — URINALYSIS, ROUTINE W REFLEX MICROSCOPIC
Bilirubin Urine: NEGATIVE
Hgb urine dipstick: NEGATIVE
Leukocytes,Ua: NEGATIVE
Nitrite: NEGATIVE
Specific Gravity, Urine: 1.025 (ref 1.000–1.030)
Total Protein, Urine: NEGATIVE
Urine Glucose: >=1000 — AB
Urobilinogen, UA: 0.2 (ref 0.0–1.0)
pH: 6 (ref 5.0–8.0)

## 2022-03-31 NOTE — Progress Notes (Signed)
Phone [phone] In person visit   Subjective:   [name] [name] is a 67 y.o. year old very pleasant female patient who presents for/with See problem oriented charting Chief Complaint  Patient presents with   Follow-up   Flank Pain    Pt c/o having aches where her kidneys are loacted.    Past Medical History-  Patient Active Problem List   Diagnosis Date Noted   (HFpEF) heart failure with preserved ejection fraction ([place]) 12/13/2021    Priority: High   History of non-ST elevation myocardial infarction (NSTEMI) 12/13/2021    Priority: High   Solitary pulmonary nodule 06/08/2016    Priority: High   Alcoholic fatty liver 25/63/8937    Priority: High   Type II diabetes mellitus, well controlled ([place]) 06/23/2010    Priority: High   Alcohol use disorder, severe, in sustained remission ([place]) 06/23/2010    Priority: High   Chronic depression 06/23/2010    Priority: High   Migraine 07/28/2016    Priority: Medium    Hypothyroid 07/21/2010    Priority: Medium    Essential hypertension 06/23/2010    Priority: Medium    Hyperlipidemia associated with type 2 diabetes mellitus ([place]) 06/23/2010    Priority: Medium    S/P revision of total knee, left 08/15/2019    Priority: Low   Excessive daytime sleepiness 09/07/2016    Priority: Low   S/P knee replacement 05/05/2015    Priority: Low   Former smoker 07/10/2014    Priority: Low   Osteoarthritis 05/16/2014    Priority: Low   Chronic post-traumatic stress disorder (PTSD) 05/02/2014    Priority: Low   Sleep disorder 05/02/2014    Priority: Low   Family history of alcoholism 05/02/2014    Priority: Low   Macrocytosis 04/19/2014    Priority: Low   Fatigue 08/15/2013    Priority: Low   Thyroid nodule 09/17/2012    Priority: Low   History of colonic polyps 04/12/2010    Priority: Low   Nonrheumatic aortic valve stenosis 12/25/2021   Dyslipidemia 12/25/2021   Major depressive disorder with single episode, in full remission  ([place]) 06/24/2021   S/P cervical spinal fusion 01/18/2021   Osteoarthritis of ankle, right 01/29/2018   Sleep related headaches 09/07/2016   Nightmares REM-sleep type 09/07/2016   RLS (restless legs syndrome) 09/07/2016   Snoring 09/07/2016   Shortness of breath 06/07/2014    Medications- reviewed and updated Current Outpatient Medications  Medication Sig Dispense Refill   Blood Glucose Monitoring Suppl (ONETOUCH VERIO) w/Device KIT Use to test blood sugar daily 1 kit 0   buPROPion (WELLBUTRIN) 75 MG tablet Take 0.5 tablets (37.5 mg total) by mouth 2 (two) times daily. 90 tablet 3   Cholecalciferol (VITAMIN D HIGH POTENCY) 25 MCG (1000 UT) capsule Take 1,000 Units by mouth daily.     CRANBERRY PO Take 1 tablet by mouth daily.     empagliflozin (JARDIANCE) 10 MG TABS tablet Take 1 tablet (10 mg total) by mouth daily before breakfast. 90 tablet 3   escitalopram (LEXAPRO) 20 MG tablet Take 1 tablet (20 mg total) by mouth daily. 90 tablet 3   glucose blood (ONETOUCH VERIO) test strip USE TO CHECK BLOOD SUGAR DAILY 100 each 12   levothyroxine (SYNTHROID) 75 MCG tablet Take 1 tablet (75 mcg total) by mouth daily. 90 tablet 3   metFORMIN (GLUCOPHAGE-XR) 500 MG 24 hr tablet Take 1 tablet (500 mg total) by mouth in the morning and at bedtime. 180 tablet  3   OneTouch Delica Lancets 73U MISC Use to test blood sugar daily 100 each 5   rosuvastatin (CRESTOR) 20 MG tablet Take 1 tablet (20 mg total) by mouth daily. 90 tablet 3   tiZANidine (ZANAFLEX) 4 MG tablet Take 1 tablet (4 mg total) by mouth 3 (three) times daily as needed for muscle spasms. 270 tablet 1   traZODone (DESYREL) 50 MG tablet Take 1 tablet (50 mg total) by mouth at bedtime. 90 tablet 3   bumetanide (BUMEX) 1 MG tablet Take 1 mg by mouth daily. (Patient not taking: Reported on 02/22/2022)     No current facility-administered medications for this visit.     Objective:  BP (!) 90/58   Pulse (!) 52   Temp (!) 97.1 F (36.2 C)   Ht  '5\' 6"'$  (1.676 m)   Wt 199 lb (90.3 kg)   SpO2 97%   BMI 32.12 kg/m  Gen: NAD, resting comfortably CV: RRR no murmurs rubs or gallops Lungs: CTAB no crackles, wheeze, rhonchi  Ext: no edema Skin: warm, dry     Assessment and Plan   #social update- going to [place] in July on a cruise 2 weeks!   #Covid 19- had in December 28 then got flu afterwards (took about a month to recover) - was a tough stretch  #Concern for UTI? Aching in CVA area S: Patients symptoms started at least 4 months ago.  Has noted more yellow to the urine- wonders if more dehydrated. Some odor.  Complains of dysuria: none; polyuria: some; nocturia: 0-1x ; urgency: yes and can have incontinence if waits.  Symptoms are getting more frequent as far as aching in flank area.  ROS- no fever, chills, nausea, vomiting. No blood in urine.  A/P: UA  will be obtained. Wonder about UTI contributing. Will get culture. Empiric treatment hold off until labs back Patient to follow up if new or worsening symptoms or failure to improve.   # Cirrhosis? S:noted on recent lung cancer screening "Upper abdomen: Cholecystectomy. Mildly irregular liver surface,compatible with cirrhosis."  A/P: noted on recent imaging  Elastography- cirrhosis noted on ct- assess level of damage and screen HCC  -twinrix recommended  # Migraines S: Ongoing issues last visit and we referred to neurology.  THC Gummies were helping some.  Neurology called several times but they were unable to connect with her. No worsening.  A/P: has upcoming visit in April with Dr. Tomi Likens   # Diabetes S: Medication:Jardiance 10 mg, metformin 500 mg twice daily blood sugars have drastically improved by time of last visit which was about 3 weeks after A1c CBGs- variable from 117 to 153 in the mornings Exercise and diet- not exercising, has avoided some of unhelathy habits that affected her bfore Lab Results  Component Value Date   HGBA1C 9.8 (H) 12/13/2021   HGBA1C 5.5  06/21/2021   HGBA1C 5.5 12/10/2020   A/P: hopefully improving- update a1c today. Continue current meds for now. Does have upcoming endocrinology visit in april   #heart failure #hypertension S: medication: None-prior benazepril stopped due to lower blood pressure.  Bumex just as needed BP Readings from Last 3 Encounters:  03/31/22 (!) 90/58  01/04/22 (!) 108/58  12/27/21 90/62  A/P: blood pressure controlled without medicine and on low side once again- has nt needed bumex lately- monitor- wonder if jardiance contributing to being lower Heart failure appears stable- no increased edema and weight down slightly  #hyperlipidemia S: Medication:rosuvastatin 20 mg daily  Lab  Results  Component Value Date   CHOL 122 06/21/2021   HDL 55.10 06/21/2021   LDLCALC 53 06/21/2021   LDLDIRECT 158.6 02/26/2014   TRIG 69.0 06/21/2021   CHOLHDL 2 06/21/2021   A/P: stable- continue current medicines   # Depression S: Medication:Lexapro 20 mg, bupropion 75 mg takes once in AM    03/31/2022   11:18 AM 02/22/2022   12:40 PM 12/13/2021    2:21 PM  Depression screen PHQ 2/9  Decreased Interest 0 0 0  Down, Depressed, Hopeless 0 0 0  PHQ - 2 Score 0 0 0  Altered sleeping 0  0  Tired, decreased energy 0  0  Change in appetite 0  0  Feeling bad or failure about yourself  0  0  Trouble concentrating 0  0  Moving slowly or fidgety/restless 0  0  Suicidal thoughts 0  0  PHQ-9 Score 0  0  Difficult doing work/chores Not difficult at all  Not difficult at all   A/P: full remission- continue current medications -remains alcohol free - 8 years next month!   #hypothyroidism S: compliant On thyroid medication-levothyroxine 75 mg Lab Results  Component Value Date   TSH 3.91 12/13/2021   A/P:stable- continue current medicines     Recommended follow up: Return in about 4 months (around 07/30/2022) for followup or sooner if needed.Schedule b4 you leave. Future Appointments  Date Time Provider [place]  05/06/2022  1:00 PM [facility] HEALTH COACH [facility]  06/07/2022  2:50 PM Pieter Partridge, DO [facility] None  06/16/2022 10:50 AM Shamleffer, Melanie Crazier, MD [facility] None    Lab/Order associations:   ICD-10-CM   1. Polyuria  R35.89 Urinalysis, Routine w reflex microscopic    Urine Culture    2. Type II diabetes mellitus, well controlled ([place])  E11.9     3. Alcoholic cirrhosis, unspecified whether ascites present ([place])  K70.30     4. Chronic heart failure with preserved ejection fraction (HCC) Chronic I50.32     5. Alcohol use disorder, severe, in sustained remission (HCC) Chronic F10.21     6. Major depressive disorder with single episode, in full remission ([place]) Chronic F32.5     7. Type 2 diabetes mellitus with hyperglycemia, without long-term current use of insulin (HCC) Chronic E11.65     8. Hyperlipidemia associated with type 2 diabetes mellitus (HCC) Chronic E11.69    E78.5       No orders of the defined types were placed in this encounter.   Return precautions advised.  [name], MD

## 2022-03-31 NOTE — Patient Instructions (Addendum)
Consider twinrix at pharmacy (Hep A and Hep B shot combo) and let us know if you get this  Please stop by lab before you go If you have mychart- we will send your results within 3 business days of [place] receiving them.  If you do not have mychart- we will call you about results within 5 business days of [place] receiving them.  *please also note that you will see labs on mychart as soon as they post. I will later go in and write notes on them- will say "notes from Dr. Yong Channel"   We will call you within two weeks about your referral for ultrasound/elastography of liver through [facility].  Their phone number is [phone].  Please call them if you have not heard in 1-2 weeks  Recommended follow up: Return in about 4 months (around 07/30/2022) for followup or sooner if needed.Schedule b4 you leave. Or 6 months if everything going well

## 2022-04-01 ENCOUNTER — Telehealth: Payer: Self-pay | Admitting: Family Medicine

## 2022-04-01 LAB — UNLABELED: Test Ordered On Req: 395

## 2022-04-01 NOTE — Telephone Encounter (Signed)
We will take that if it is the soonest available

## 2022-04-01 NOTE — Telephone Encounter (Signed)
Brandy with [facility] states they unable to schedule a Liver Elastography until late April.

## 2022-04-01 NOTE — Telephone Encounter (Signed)
FYI

## 2022-04-04 LAB — PAT ID TIQ DOC: Test Affected: 395

## 2022-04-05 ENCOUNTER — Encounter: Payer: Self-pay | Admitting: Family Medicine

## 2022-04-06 ENCOUNTER — Other Ambulatory Visit: Payer: Self-pay | Admitting: Family Medicine

## 2022-04-06 DIAGNOSIS — R3589 Other polyuria: Secondary | ICD-10-CM

## 2022-04-06 NOTE — Telephone Encounter (Signed)
Per lab tech instructions, called patient and informed her that she can drop off a urine sample to lab when able. Pt verbalized understanding.

## 2022-04-07 ENCOUNTER — Other Ambulatory Visit: Payer: Medicare Other

## 2022-04-07 DIAGNOSIS — R3589 Other polyuria: Secondary | ICD-10-CM

## 2022-04-09 LAB — URINE CULTURE
MICRO NUMBER:: 14570408
SPECIMEN QUALITY:: ADEQUATE

## 2022-04-10 ENCOUNTER — Encounter: Payer: Self-pay | Admitting: Family Medicine

## 2022-04-11 ENCOUNTER — Other Ambulatory Visit: Payer: Self-pay | Admitting: Family Medicine

## 2022-04-11 ENCOUNTER — Telehealth: Payer: Medicare Other

## 2022-04-11 MED ORDER — FLUCONAZOLE 150 MG PO TABS: 150.0000 mg | ORAL_TABLET | Freq: Once | ORAL | 0 refills | Status: AC

## 2022-04-13 ENCOUNTER — Ambulatory Visit (INDEPENDENT_AMBULATORY_CARE_PROVIDER_SITE_OTHER): Payer: Medicare Other | Admitting: *Deleted

## 2022-04-13 NOTE — Patient Instructions (Signed)
Please call the care guide team at [phone] if you need to cancel or reschedule your appointment.   If you are experiencing a Mental Health or [place] or need someone to talk to, please call the Suicide and Crisis Lifeline: 988 call the [place] National Suicide Prevention Lifeline: (414)123-5911 or TTY: 209-080-4033 TTY (781)059-0034) to talk to a trained counselor call 1-800-273-TALK (toll free, 24 hour hotline) go to [facility] [address], [place] [phone]) call 911   Following is a copy of the CCM Program Consent:  CCM service includes personalized support from designated clinical staff supervised by the physician, including individualized plan of care and coordination with other care providers 24/7 contact phone numbers for assistance for urgent and routine care needs. Service will only be billed when office clinical staff spend 20 minutes or more in a month to coordinate care. Only one practitioner may furnish and bill the service in a calendar month. The patient may stop CCM services at amy time (effective at the end of the month) by phone call to the office staff. The patient will be responsible for cost sharing (co-pay) or up to 20% of the service fee (after annual deductible is met)  Following is a copy of your full provider care plan:   Goals Addressed             This Visit's Progress    CCM ([place]) EXPECTED OUTCOME: MONITOR, SELF-MANAGE AND REDUCE SYMPTOMS OF CONGESTIVE HEART FAILURE       Current Barriers:  Knowledge Deficits related to Congestive Heart Failure management Chronic Disease Management support and education needs related to Congestive Heart Failure, diet Patient reports she has scale and continues to weigh daily and record Patient reports she has all medication and taking as prescribed  Planned Interventions: Provided education on low sodium diet Provided education about  placing scale on hard, flat surface Advised patient to weigh each morning after emptying bladder Discussed importance of daily weight and advised patient to weigh and record daily Discussed the importance of keeping all appointments with provider Provided patient with education about the role of exercise in the management of heart failure Reinforced Heart Failure action plan with emphasis on yellow zone  Symptom Management: Take medications as prescribed   Attend all scheduled provider appointments Call pharmacy for medication refills 3-7 days in advance of running out of medications Attend church or other social activities Perform all self care activities independently  Perform IADL's (shopping, preparing meals, housekeeping, managing finances) independently Call provider office for new concerns or questions  call office if I gain more than 2 pounds in one day or 5 pounds in one week keep legs up while sitting track weight in diary use salt in moderation watch for swelling in feet, ankles and legs every day weigh myself daily develop a rescue plan follow rescue plan if symptoms flare-up eat more whole grains, fruits and vegetables, lean meats and healthy fats know when to call the doctor- when in the yellow zone (Heart failure action plan) Follow heart failure action plan Know how you feel each day and call your doctor early on for change in health status, symptoms  Follow Up Plan: Telephone follow up appointment with care management team member scheduled for:  06/24/22 at 130 pm       CCM ([place]) EXPECTED OUTCOME:  MONITOR, SELF-MANAGE AND REDUCE SYMPTOMS OF DIABETES       Current Barriers:  Knowledge Deficits related to  Diabetes management Chronic Disease Management support and education needs related to Diabetes, diet, working towards decreasing [facility] Patient reports she lives with spouse [name], states she is independent with all aspects of her care, continues to drive, tries  to walk 2-3 times per week, does not drink sugary drinks, only water. Patient states she checks CBG once daily fasting and ranges 100-120, pt states AIC is down to 5.9 on 03/31/22, down from 9.8, pt is excited to see improvement in [facility] Patient reports she has yeast infection, prescribed diflucan one time dose that she is picking up today, has not picked up yet to she has been out of town  Planned Interventions: Reviewed medications with patient and discussed importance of medication adherence;        Reviewed prescribed diet with patient carbohydrate modified, plate method; Counseled on importance of regular laboratory monitoring as prescribed;        Advised patient, providing education and rationale, to check cbg once daily and record        Review of patient status, including review of consultants reports, relevant laboratory and other test results, and medications completed;       Advised patient to discuss any issues with blood sugar with provider;      Reviewed upcoming scheduled appointments Reviewed that infection can elevate blood sugar  Symptom Management: Take medications as prescribed   Attend all scheduled provider appointments Call pharmacy for medication refills 3-7 days in advance of running out of medications Attend church or other social activities Perform all self care activities independently  Perform IADL's (shopping, preparing meals, housekeeping, managing finances) independently Call provider office for new concerns or questions  check blood sugar at prescribed times: once daily check feet daily for cuts, sores or redness enter blood sugar readings and medication or insulin into daily log take the blood sugar log to all doctor visits take the blood sugar meter to all doctor visits trim toenails straight across fill half of plate with vegetables limit fast food meals to no more than 1 per week manage portion size prepare main meal at home 3 to 5 days each week read  food labels for fat, fiber, carbohydrates and portion size Pickup one time dose diflucan today, if symptoms (yeast) do not improve, please let your doctor know Continue walking as much as you can, weather permitting  Follow Up Plan: Telephone follow up appointment with care management team member scheduled for:  06/24/22 at 130 pm          Patient verbalizes understanding of instructions and care plan provided today and agrees to view in [place]. Active MyChart status and patient understanding of how to access instructions and care plan via MyChart confirmed with patient.     Telephone follow up appointment with care management team member scheduled for:  06/24/22 at 130 pm

## 2022-04-13 NOTE — Chronic Care Management (AMB) (Signed)
Chronic Care Management   CCM RN Visit Note  04/13/2022 Name: [name] [name] MRN: [id number] DOB: 12/26/1955  Subjective: [name] [name] is a 67 y.o. year old female who is a primary care patient of Marin Olp, MD. The patient was referred to the Chronic Care Management team for assistance with care management needs subsequent to provider initiation of CCM services and plan of care.    Today's Visit:  Engaged with patient by telephone for follow up visit.        Goals Addressed             This Visit's Progress    CCM (CONGESTIVE HEART FAILURE) EXPECTED OUTCOME: MONITOR, SELF-MANAGE AND REDUCE SYMPTOMS OF CONGESTIVE HEART FAILURE       Current Barriers:  Knowledge Deficits related to Congestive Heart Failure management Chronic Disease Management support and education needs related to Congestive Heart Failure, diet Patient reports she has scale and continues to weigh daily and record Patient reports she has all medication and taking as prescribed  Planned Interventions: Provided education on low sodium diet Provided education about placing scale on hard, flat surface Advised patient to weigh each morning after emptying bladder Discussed importance of daily weight and advised patient to weigh and record daily Discussed the importance of keeping all appointments with provider Provided patient with education about the role of exercise in the management of heart failure Reinforced Heart Failure action plan with emphasis on yellow zone  Symptom Management: Take medications as prescribed   Attend all scheduled provider appointments Call pharmacy for medication refills 3-7 days in advance of running out of medications Attend church or other social activities Perform all self care activities independently  Perform IADL's (shopping, preparing meals, housekeeping, managing finances) independently Call provider office for new concerns or questions  call office if I gain more  than 2 pounds in one day or 5 pounds in one week keep legs up while sitting track weight in diary use salt in moderation watch for swelling in feet, ankles and legs every day weigh myself daily develop a rescue plan follow rescue plan if symptoms flare-up eat more whole grains, fruits and vegetables, lean meats and healthy fats know when to call the doctor- when in the yellow zone (Heart failure action plan) Follow heart failure action plan Know how you feel each day and call your doctor early on for change in health status, symptoms  Follow Up Plan: Telephone follow up appointment with care management team member scheduled for:  06/24/22 at 130 pm       CCM ([place]) EXPECTED OUTCOME:  MONITOR, SELF-MANAGE AND REDUCE SYMPTOMS OF DIABETES       Current Barriers:  Knowledge Deficits related to Diabetes management Chronic Disease Management support and education needs related to Diabetes, diet, working towards decreasing [facility] Patient reports she lives with spouse [name] [name], states she is independent with all aspects of her care, continues to drive, tries to walk 2-3 times per week, does not drink sugary drinks, only water. Patient states she checks CBG once daily fasting and ranges 100-120, pt states AIC is down to 5.9 on 03/31/22, down from 9.8, pt is excited to see improvement in [facility] Patient reports she has yeast infection, prescribed diflucan one time dose that she is picking up today, has not picked up yet to she has been out of town  Planned Interventions: Reviewed medications with patient and discussed importance of medication adherence;        Reviewed prescribed diet with  patient carbohydrate modified, plate method; Counseled on importance of regular laboratory monitoring as prescribed;        Advised patient, providing education and rationale, to check cbg once daily and record        Review of patient status, including review of consultants reports, relevant laboratory and other test  results, and medications completed;       Advised patient to discuss any issues with blood sugar with provider;      Reviewed upcoming scheduled appointments Reviewed that infection can elevate blood sugar  Symptom Management: Take medications as prescribed   Attend all scheduled provider appointments Call pharmacy for medication refills 3-7 days in advance of running out of medications Attend church or other social activities Perform all self care activities independently  Perform IADL's (shopping, preparing meals, housekeeping, managing finances) independently Call provider office for new concerns or questions  check blood sugar at prescribed times: once daily check feet daily for cuts, sores or redness enter blood sugar readings and medication or insulin into daily log take the blood sugar log to all doctor visits take the blood sugar meter to all doctor visits trim toenails straight across fill half of plate with vegetables limit fast food meals to no more than 1 per week manage portion size prepare main meal at home 3 to 5 days each week read food labels for fat, fiber, carbohydrates and portion size Pickup one time dose diflucan today, if symptoms (yeast) do not improve, please let your doctor know Continue walking as much as you can, weather permitting  Follow Up Plan: Telephone follow up appointment with care management team member scheduled for:  06/24/22 at 130 pm          Plan:Telephone follow up appointment with care management team member scheduled for:  06/24/22 at 130 pm  [name] [facility], BSN RN Case Manager [place] at [employer] [phone]

## 2022-04-20 ENCOUNTER — Telehealth: Payer: Self-pay | Admitting: Family Medicine

## 2022-04-20 NOTE — Telephone Encounter (Signed)
Copied from [place] [phone]. Topic: Medicare AWV >> Apr 20, 2022 10:45 AM Gillis Santa wrote: Reason for CRM:   LVM AWV NEEDS R/S call Santiago Glad [phone] to r/s AWVS WITH Otila Kluver

## 2022-04-21 ENCOUNTER — Telehealth: Payer: Self-pay | Admitting: Family Medicine

## 2022-04-21 DIAGNOSIS — E1159 Type 2 diabetes mellitus with other circulatory complications: Secondary | ICD-10-CM

## 2022-04-21 DIAGNOSIS — I5030 Unspecified diastolic (congestive) heart failure: Secondary | ICD-10-CM | POA: Diagnosis not present

## 2022-04-21 NOTE — Telephone Encounter (Signed)
Copied from [place] [phone]. Topic: Medicare AWV >> Apr 21, 2022 10:16 AM Gillis Santa wrote: Clancy Gourd to schedule their annual wellness visit. Appointment made for 04/26/2022.  [place] Direct Dial [phone]

## 2022-04-27 ENCOUNTER — Ambulatory Visit (INDEPENDENT_AMBULATORY_CARE_PROVIDER_SITE_OTHER): Payer: Medicare Other

## 2022-04-27 VITALS — Wt 199.0 lb

## 2022-04-27 DIAGNOSIS — Z0000 Encounter for general adult medical examination without abnormal findings: Secondary | ICD-10-CM

## 2022-04-27 NOTE — Progress Notes (Signed)
I connected with  [name] [name] on 04/27/22 by a audio enabled telemedicine application and verified that I am speaking with the correct person using two identifiers.  Patient Location: Home  Provider Location: Home Office  I discussed the limitations of evaluation and management by telemedicine. The patient expressed understanding and agreed to proceed.   Subjective:   [name] [name] is a 67 y.o. female who presents for Medicare Annual (Subsequent) preventive examination.  Review of Systems     Cardiac Risk Factors include: advanced age (>39mn, >>97women);hypertension;dyslipidemia;obesity (BMI >30kg/m2);diabetes mellitus     Objective:    Today's Vitals   04/27/22 1501  Weight: 199 lb (90.3 kg)   Body mass index is 32.12 kg/m.     04/27/2022    3:06 PM 02/22/2022   12:47 PM 04/23/2021    3:03 PM 01/18/2021    6:32 AM 01/11/2021   11:10 AM 06/22/2020    3:35 PM 04/02/2020   10:48 AM  Advanced Directives  Does Patient Have a Medical Advance Directive? No Yes Yes Yes Yes Yes Yes  Type of ASocial research officer, governmentLiving will Healthcare Power of [place]ub[place]Living will [place]Union [place]vel[place] HillLiving will Living will;Healthcare Power of Attorney   Does patient want to make changes to medical advance directive?  No - Patient declined    No - Patient declined No - Patient declined  Copy of HHartfordin Chart?  No - copy requested No - copy requested No - copy requested  No - copy requested   Would patient like information on creating a medical advance directive? No - Patient declined      No - Patient declined    Current Medications (verified) Outpatient Encounter Medications as of 04/27/2022  Medication Sig   Blood Glucose Monitoring Suppl (ONETOUCH VERIO) w/Device KIT Use to test blood sugar daily   buPROPion (WELLBUTRIN) 75 MG tablet Take 0.5 tablets (37.5 mg total) by mouth 2 (two) times daily.    Cholecalciferol (VITAMIN D HIGH POTENCY) 25 MCG (1000 UT) capsule Take 1,000 Units by mouth daily.   CRANBERRY PO Take 1 tablet by mouth daily.   empagliflozin (JARDIANCE) 10 MG TABS tablet Take 1 tablet (10 mg total) by mouth daily before breakfast.   escitalopram (LEXAPRO) 20 MG tablet Take 1 tablet (20 mg total) by mouth daily.   glucose blood (ONETOUCH VERIO) test strip USE TO CHECK BLOOD SUGAR DAILY   levothyroxine (SYNTHROID) 75 MCG tablet Take 1 tablet (75 mcg total) by mouth daily.   metFORMIN (GLUCOPHAGE-XR) 500 MG 24 hr tablet Take 1 tablet (500 mg total) by mouth in the morning and at bedtime.   OneTouch Delica Lancets [id number] Use to test blood sugar daily   rosuvastatin (CRESTOR) 20 MG tablet Take 1 tablet (20 mg total) by mouth daily.   tiZANidine (ZANAFLEX) 4 MG tablet Take 1 tablet (4 mg total) by mouth 3 (three) times daily as needed for muscle spasms.   traZODone (DESYREL) 50 MG tablet Take 1 tablet (50 mg total) by mouth at bedtime.   [DISCONTINUED] bumetanide (BUMEX) 1 MG tablet Take 1 mg by mouth daily. (Patient not taking: Reported on 02/22/2022)   No facility-administered encounter medications on file as of 04/27/2022.    Allergies (verified) Adhesive [tape], Nitrofurantoin, and Other   History: Past Medical History:  Diagnosis Date   Acute meniscal tear of knee    Left knee   Alcohol problem drinking  rehab   Anemia    menstrual related   Anxiety    Arthritis    right ankle-neuropathy   Colon polyps    Depression    DM (diabetes mellitus), type 2 ([place]) 12/2009   type 2   Dyspnea    Evalluate for OSA (obstructive sleep apnea) 08/15/2013   No OSA on sleep study but may be underestimated.-never used cpap     GERD (gastroesophageal reflux disease)    not currently taking.   Hyperlipidemia    Hypertension    Hypothyroidism    Liver disease    Neuromuscular disorder ([place])    neropathy bilateral feet.   Thyroid disease    Past Surgical History:   Procedure Laterality Date   ADENOIDECTOMY     ANTERIOR CERVICAL DECOMP/DISCECTOMY FUSION N/A 01/18/2021   Procedure: Cervical five-six Cervical six-seven Anterior Cervical Decompression/ discectomy Fusion;  Surgeon: Eustace Moore, MD;  Location: [place] [facility];  Service: Neurosurgery;  Laterality: N/A;   BREAST SURGERY Right    lumpectomy-benign   CESAREAN SECTION  1987   1 time   CHOLECYSTECTOMY     HAMMER TOE SURGERY     Dr. Doran Durand [facility]- feb 2022   HAMMERTOE RECONSTRUCTION WITH WEIL OSTEOTOMY Bilateral 04/02/2020   Procedure: Bilateral 2-3 Weil and hammertoe corrections;  Surgeon: Wylene Simmer, MD;  Location: [place];  Service: Orthopedics;  Laterality: Bilateral;   INNER EAR SURGERY     left ear had tubes put in and removed, scar tissue built up/ loss of hearing in left ear for over a year   MASS EXCISION Left 12/16/2013   Procedure: EXCISION LEFT  DISTAL THIGH MASS;  Surgeon: Mauri Pole, MD;  Location: [facility];  Service: Orthopedics;  Laterality: Left;   right ankle surgery      x 2- no retained hardware   right rotator cuff surgery      left side   TONSILLECTOMY     TOTAL KNEE ARTHROPLASTY  2009   right   TOTAL KNEE ARTHROPLASTY Left 05/05/2015   Procedure: LEFT TOTAL KNEE ARTHROPLASTY;  Surgeon: Paralee Cancel, MD;  Location: [facility];  Service: Orthopedics;  Laterality: Left;   TOTAL KNEE REVISION Right 12/16/2013   Procedure: RIGHT TOTAL KNEE REVISION POLY EXCHANGE;  Surgeon: Mauri Pole, MD;  Location: [facility];  Service: Orthopedics;  Laterality: Right;   TOTAL KNEE REVISION Left 08/15/2019   Procedure: LEFT KNEE REVISION;  Surgeon: Paralee Cancel, MD;  Location: [facility];  Service: Orthopedics;  Laterality: Left;  2 hrs   TUBAL LIGATION  1987   Family History  Adopted: Yes   Social History   Socioeconomic History   Marital status: Married    Spouse name: Not on file   Number of children: Not on file   Years of education: Not on file   Highest  education level: Not on file  Occupational History   Not on file  Tobacco Use   Smoking status: Former    Packs/day: 1.50    Years: 30.00    Total pack years: 45.00    Types: Cigarettes    Start date: 60    Quit date: 02/21/2009    Years since quitting: 13.1   Smokeless tobacco: Never  Vaping Use   Vaping Use: Former   Substances: CBD  Substance and Sexual Activity   Alcohol use: Not Currently   Drug use: Not Currently    Types: Marijuana    Comment: last time 3  days ago   Sexual activity: Yes    Birth control/protection: Post-menopausal  Other Topics Concern   Not on file  Social History Narrative   Married. 1 daughter. No grandkids. 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       Hobbies: shopping, travel   Social Determinants of Health   Financial Resource Strain: Low Risk  (04/27/2022)   Overall Financial Resource Strain (CARDIA)    Difficulty of Paying Living Expenses: Not hard at all  Food Insecurity: No Food Insecurity (04/27/2022)   Hunger Vital Sign    Worried About Running Out of Food in the Last Year: Never true    Ran Out of Food in the Last Year: Never true  Transportation Needs: No Transportation Needs (04/27/2022)   PRAPARE - Hydrologist (Medical): No    Lack of Transportation (Non-Medical): No  Physical Activity: Inactive (04/27/2022)   Exercise Vital Sign    Days of Exercise per Week: 0 days    Minutes of Exercise per Session: 0 min  Stress: No Stress Concern Present (04/27/2022)   [place]    Feeling of Stress : Not at all  Social Connections: Moderately Isolated (04/27/2022)   Social Connection and Isolation Panel [NHANES]    Frequency of Communication with Friends and Family: More than three times a week    Frequency of Social Gatherings with Friends and Family: More than three times a week    Attends Religious Services: Never     Marine scientist or Organizations: No    Attends Music therapist: Never    Marital Status: Married    Tobacco Counseling Counseling given: Not Answered   Clinical Intake:  Pre-visit preparation completed: Yes  Pain : No/denies pain     BMI - recorded: 32.12 Nutritional Status: BMI > 30  Obese Nutritional Risks: None Diabetes: Yes CBG done?: No Did pt. bring in CBG monitor from home?: No  How often do you need to have someone help you when you read instructions, pamphlets, or other written materials from your doctor or pharmacy?: 1 - Never  Diabetic?Nutrition Risk Assessment:  Has the patient had any N/V/D within the last 2 months?  No  Does the patient have any non-healing wounds?  No  Has the patient had any unintentional weight loss or weight gain?  No   Diabetes:  Is the patient diabetic?  Yes  If diabetic, was a CBG obtained today?  No  Did the patient bring in their glucometer from home?  No  How often do you monitor your CBG's? As needed.   Financial Strains and Diabetes Management:  Are you having any financial strains with the device, your supplies or your medication? No .  Does the patient want to be seen by Chronic Care Management for management of their diabetes?  No  Would the patient like to be referred to a Nutritionist or for Diabetic Management?  No   Diabetic Exams:  Diabetic Eye Exam: Completed 02/11/22 Diabetic Foot Exam: Overdue, Pt has been advised about the importance in completing this exam. Pt is scheduled for diabetic foot exam on next appt .   Interpreter Needed?: No  Information entered by :: Charlott Rakes, LPN   Activities of Daily Living    04/27/2022    3:07 PM  In your present state of health, do you have any difficulty  performing the following activities:  Hearing? 0  Vision? 0  Difficulty concentrating or making decisions? 0  Walking or climbing stairs? 0  Dressing or bathing? 0  Doing errands,  shopping? 0  Preparing Food and eating ? N  Using the Toilet? N  In the past six months, have you accidently leaked urine? N  Do you have problems with loss of bowel control? N  Managing your Medications? N  Managing your Finances? N  Housekeeping or managing your Housekeeping? N    Patient Care Team: Marin Olp, MD as PCP - General (Family Medicine) Minus Breeding, MD as PCP - Cardiology (Cardiology) [facility] as Consulting Physician Paralee Cancel, MD as Consulting Physician (Orthopedic Surgery) Melina Schools, MD as Consulting Physician (Orthopedic Surgery) Roseanne Kaufman, MD as Consulting Physician (Orthopedic Surgery) Edythe Clarity, [facility] as Pharmacist (Pharmacist) Kassie Mends, RN as [place] any recent [place] you may have received from other than [facility] providers in the past year (date may be approximate).     Assessment:   This is a routine wellness examination for [name] [name].  Hearing/Vision screen Hearing Screening - Comments:: Pt wears hearing aids  Vision Screening - Comments:: Pt follows up with dr Katy Fitch for annual eye exams   Dietary issues and exercise activities discussed: Current Exercise Habits: The patient does not participate in regular exercise at present   Goals Addressed             This Visit's Progress    Patient Stated       None at this time       Depression Screen    04/27/2022    3:05 PM 03/31/2022   11:18 AM 02/22/2022   12:40 PM 12/13/2021    2:21 PM 06/24/2021    1:25 PM 04/23/2021    3:02 PM 12/14/2020    1:38 PM  PHQ 2/9 Scores  PHQ - 2 Score 0 0 0 0 0 0 0  PHQ- 9 Score 0 0  0 0  1    Fall Risk    04/27/2022    3:07 PM 02/22/2022   12:42 PM 12/13/2021    2:21 PM 07/01/2021   10:06 AM 04/23/2021    3:04 PM  [place] in the past year? 0 0 0 0 1  Number falls in past yr: 0  0 0 1  Injury with Fall? 0  0 0 0  Risk for fall due to : Impaired  vision  No Fall Risks No Fall Risks Impaired vision;Impaired balance/gait  Follow up Falls prevention discussed  Falls evaluation completed Falls evaluation completed Falls prevention discussed    FALL RISK PREVENTION PERTAINING TO THE HOME:  Any stairs in or around the home? No  If so, are there any without handrails? No  Home free of loose throw rugs in walkways, pet beds, electrical cords, etc? Yes  Adequate lighting in your home to reduce risk of falls? Yes   ASSISTIVE DEVICES UTILIZED TO PREVENT FALLS:  Life alert? No  Use of a cane, walker or w/c? No  Grab bars in the bathroom? No  Shower chair or bench in shower? No  Elevated toilet seat or a handicapped toilet? No   TIMED UP AND GO:  Was the test performed? No .  Cognitive Function:        04/27/2022    3:08 PM 04/23/2021    3:06 PM  6CIT Screen  What Year? 0 points 0 points  What month? 0 points 0 points  What time? 0 points 0 points  Count back from 20 0 points 0 points  Months in reverse 4 points 0 points  Repeat phrase 6 points 2 points  Total Score 10 points 2 points    Immunizations Immunization History  Administered Date(s) Administered   Fluad Quad(high Dose 65+) 12/09/2019, 12/14/2020   Hep A / Hep B 04/09/2022   Influenza Split 01/06/2011   Influenza, High Dose Seasonal PF 11/05/2021   Influenza, Quadrivalent, Recombinant, Inj, Pf 11/02/2018   Influenza,inj,Quad PF,6+ Mos 12/14/2012, 11/21/2013, 11/06/2014, 11/28/2016, 11/13/2017   Influenza-Unspecified 11/27/2015   Moderna Covid-19 Vaccine Bivalent Booster 66yr & up 01/06/2021, 11/20/2021   Moderna Sars-Covid-2 Vaccination 04/17/2019, 05/15/2019, 12/30/2019, 05/26/2020   PNEUMOCOCCAL CONJUGATE-20 06/24/2021   Pneumococcal Conjugate-13 10/17/2013   Pneumococcal Polysaccharide-23 03/13/2015   Pneumococcal-Unspecified 02/21/2006   Respiratory Syncytial Virus Vaccine,Recomb Aduvanted(Arexvy) 11/05/2021   Tdap 06/06/2012   Zoster Recombinat  (Shingrix) 02/08/2019, 05/30/2019   Zoster, Live 10/09/2014    TDAP status: Up to date  Flu Vaccine status: Up to date  Pneumococcal vaccine status: Up to date  Covid-19 vaccine status: Completed vaccines  Qualifies for Shingles Vaccine? Yes   Zostavax completed Yes   Shingrix Completed?: Yes  Screening Tests Health Maintenance  Topic Date Due   FOOT EXAM  07/03/2021   COVID-19 Vaccine (7 - 2023-24 season) 01/15/2022   DTaP/Tdap/Td (2 - Td or Tdap) 06/07/2022   HEMOGLOBIN A1C  09/29/2022   Diabetic kidney evaluation - Urine ACR  12/14/2022   MAMMOGRAM  01/11/2023   COLONOSCOPY (Pts [phone] coverage will need to be confirmed)  01/11/2023   OPHTHALMOLOGY EXAM  02/12/2023   Lung Cancer Screening  03/03/2023   Diabetic kidney evaluation - eGFR measurement  04/01/2023   Medicare Annual Wellness (AWV)  04/27/2023   INFLUENZA VACCINE  Completed   DEXA SCAN  Completed   Hepatitis C Screening  Completed   Zoster Vaccines- Shingrix  Completed   HPV VACCINES  Aged Out   Pneumonia Vaccine 6520Years old  Discontinued    Health Maintenance  Health Maintenance Due  Topic Date Due   FOOT EXAM  07/03/2021   COVID-19 Vaccine (7 - 2023-24 season) 01/15/2022    Colorectal cancer screening: Type of screening: Colonoscopy. Completed 01/10/18. Repeat every 5 years  Mammogram status: Completed 01/10/22. Repeat every year  Bone Density status: Completed 12/11/19. Results reflect: Bone density results: NORMAL. Repeat every 2 years.   Additional Screening:  Hepatitis C Screening:  Completed 04/11/14  Vision Screening: Recommended annual ophthalmology exams for early detection of glaucoma and other disorders of the eye. Is the patient up to date with their annual eye exam?  Yes  Who is the provider or what is the name of the office in which the patient attends annual eye exams? Dr GrKaty FitchIf pt is not established with a provider, would they like to be referred to a provider  to establish care? No .   Dental Screening: Recommended annual dental exams for proper oral hygiene  Community Resource Referral / Chronic Care Management: CRR required this visit?  No   CCM required this visit?  No      Plan:     I have personally reviewed and noted the following in the patient's chart:   Medical and social history Use of alcohol, tobacco or illicit drugs  Current medications and supplements including opioid prescriptions. Patient is not currently  taking opioid prescriptions. Functional ability and status Nutritional status Physical activity Advanced directives List of other physicians Hospitalizations, surgeries, and ER visits in previous 12 months Vitals Screenings to include cognitive, depression, and falls Referrals and appointments  In addition, I have reviewed and discussed with patient certain preventive protocols, quality metrics, and best practice recommendations. A written personalized care plan for preventive services as well as general preventive health recommendations were provided to patient.     Willette Brace, LPN   [id number]   Nurse Notes: pt had an increase with cognition test of 10, pt had a hard time concentrating on repeating the months and also remembering the address stated.

## 2022-04-27 NOTE — Patient Instructions (Signed)
[name] , Thank you for taking time to come for your Medicare Wellness Visit. I appreciate your ongoing commitment to your health goals. Please review the following plan we discussed and let me know if I can assist you in the future.   These are the goals we discussed:  Goals      CCM (CONGESTIVE HEART FAILURE) EXPECTED OUTCOME: MONITOR, SELF-MANAGE AND REDUCE SYMPTOMS OF CONGESTIVE HEART FAILURE     Current Barriers:  Knowledge Deficits related to Congestive Heart Failure management Chronic Disease Management support and education needs related to Congestive Heart Failure, diet Patient reports she has scale and continues to weigh daily and record Patient reports she has all medication and taking as prescribed  Planned Interventions: Provided education on low sodium diet Provided education about placing scale on hard, flat surface Advised patient to weigh each morning after emptying bladder Discussed importance of daily weight and advised patient to weigh and record daily Discussed the importance of keeping all appointments with provider Provided patient with education about the role of exercise in the management of heart failure Reinforced Heart Failure action plan with emphasis on yellow zone  Symptom Management: Take medications as prescribed   Attend all scheduled provider appointments Call pharmacy for medication refills 3-7 days in advance of running out of medications Attend church or other social activities Perform all self care activities independently  Perform IADL's (shopping, preparing meals, housekeeping, managing finances) independently Call provider office for new concerns or questions  call office if I gain more than 2 pounds in one day or 5 pounds in one week keep legs up while sitting track weight in diary use salt in moderation watch for swelling in feet, ankles and legs every day weigh myself daily develop a rescue plan follow rescue plan if symptoms  flare-up eat more whole grains, fruits and vegetables, lean meats and healthy fats know when to call the doctor- when in the yellow zone (Heart failure action plan) Follow heart failure action plan Know how you feel each day and call your doctor early on for change in health status, symptoms  Follow Up Plan: Telephone follow up appointment with care management team member scheduled for:  06/24/22 at 130 pm       CCM (DIABETES) EXPECTED OUTCOME:  MONITOR, SELF-MANAGE AND REDUCE SYMPTOMS OF DIABETES     Current Barriers:  Knowledge Deficits related to Diabetes management Chronic Disease Management support and education needs related to Diabetes, diet, working towards decreasing [facility] Patient reports she lives with spouse [name] [name], states she is independent with all aspects of her care, continues to drive, tries to walk 2-3 times per week, does not drink sugary drinks, only water. Patient states she checks CBG once daily fasting and ranges 100-120, pt states AIC is down to 5.9 on 03/31/22, down from 9.8, pt is excited to see improvement in [facility] Patient reports she has yeast infection, prescribed diflucan one time dose that she is picking up today, has not picked up yet to she has been out of town  Planned Interventions: Reviewed medications with patient and discussed importance of medication adherence;        Reviewed prescribed diet with patient carbohydrate modified, plate method; Counseled on importance of regular laboratory monitoring as prescribed;        Advised patient, providing education and rationale, to check cbg once daily and record        Review of patient status, including review of consultants reports, relevant laboratory and other test  results, and medications completed;       Advised patient to discuss any issues with blood sugar with provider;      Reviewed upcoming scheduled appointments Reviewed that infection can elevate blood sugar  Symptom Management: Take medications as  prescribed   Attend all scheduled provider appointments Call pharmacy for medication refills 3-7 days in advance of running out of medications Attend church or other social activities Perform all self care activities independently  Perform IADL's (shopping, preparing meals, housekeeping, managing finances) independently Call provider office for new concerns or questions  check blood sugar at prescribed times: once daily check feet daily for cuts, sores or redness enter blood sugar readings and medication or insulin into daily log take the blood sugar log to all doctor visits take the blood sugar meter to all doctor visits trim toenails straight across fill half of plate with vegetables limit fast food meals to no more than 1 per week manage portion size prepare main meal at home 3 to 5 days each week read food labels for fat, fiber, carbohydrates and portion size Pickup one time dose diflucan today, if symptoms (yeast) do not improve, please let your doctor know Continue walking as much as you can, weather permitting  Follow Up Plan: Telephone follow up appointment with care management team member scheduled for:  06/24/22 at 130 pm       Patient Stated     Lose weight      Patient Stated     None at this time     Track and Manage My Symptoms-Depression     Timeframe:  Long-Range Goal Priority:  High Start Date:   06/23/21                          Expected End Date:  12/26/21                     Follow Up Date 09/25/21    - avoid negative self-talk - exercise at least 2 to 3 times per week - have a plan for how to handle bad days    Why is this important?   Keeping track of your progress will help your treatment team find the right mix of medicine and therapy for you.  Write in your journal every day.  Day-to-day changes in depression symptoms are normal. It may be more helpful to check your progress at the end of each week instead of every day.     Notes: Resuming lexapro  today (06/23/21)        This is a list of the screening recommended for you and due dates:  Health Maintenance  Topic Date Due   Complete foot exam   07/03/2021   COVID-19 Vaccine (7 - 2023-24 season) 01/15/2022   DTaP/Tdap/Td vaccine (2 - Td or Tdap) 06/07/2022   Hemoglobin A1C  09/29/2022   Yearly kidney health urinalysis for diabetes  12/14/2022   Mammogram  01/11/2023   Colon Cancer Screening  01/11/2023   Eye exam for diabetics  02/12/2023   Screening for Lung Cancer  03/03/2023   Yearly kidney function blood test for diabetes  04/01/2023   Medicare Annual Wellness Visit  04/27/2023   Flu Shot  Completed   DEXA scan (bone density measurement)  Completed   Hepatitis C Screening: USPSTF Recommendation to screen - Ages 72-79 yo.  Completed   Zoster (Shingles) Vaccine  Completed   HPV Vaccine  Aged Out   Pneumonia Vaccine  Discontinued    Advanced directives: Advance directive discussed with you today. Even though you declined this today please call our office should you change your mind and we can give you the proper paperwork for you to fill out.   Conditions/risks identified: none at this time   Next appointment: Follow up in one year for your annual wellness visit   Preventive Care 65 Years and Older, Female Preventive care refers to lifestyle choices and visits with your health care provider that can promote health and wellness. What does preventive care include? A yearly physical exam. This is also called an annual well check. Dental exams once or twice a year. Routine eye exams. Ask your health care provider how often you should have your eyes checked. Personal lifestyle choices, including: Daily care of your teeth and gums. Regular physical activity. Eating a healthy diet. Avoiding tobacco and drug use. Limiting alcohol use. Practicing safe sex. Taking low-dose aspirin every day. Taking vitamin and mineral supplements as recommended by your health care  provider. What happens during an annual well check? The services and screenings done by your health care provider during your annual well check will depend on your age, overall health, lifestyle risk factors, and family history of disease. Counseling  Your health care provider may ask you questions about your: Alcohol use. Tobacco use. Drug use. Emotional well-being. Home and relationship well-being. Sexual activity. Eating habits. History of falls. Memory and ability to understand (cognition). Work and work Statistician. Reproductive health. Screening  You may have the following tests or measurements: Height, weight, and BMI. Blood pressure. Lipid and cholesterol levels. These may be checked every 5 years, or more frequently if you are over 54 years old. Skin check. Lung cancer screening. You may have this screening every year starting at age 43 if you have a 30-pack-year history of smoking and currently smoke or have quit within the past 15 years. Fecal occult blood test (FOBT) of the stool. You may have this test every year starting at age 60. Flexible sigmoidoscopy or colonoscopy. You may have a sigmoidoscopy every 5 years or a colonoscopy every 10 years starting at age 74. Hepatitis C blood test. Hepatitis B blood test. Sexually transmitted disease (STD) testing. Diabetes screening. This is done by checking your blood sugar (glucose) after you have not eaten for a while (fasting). You may have this done every 1-3 years. Bone density scan. This is done to screen for osteoporosis. You may have this done starting at age 23. Mammogram. This may be done every 1-2 years. Talk to your health care provider about how often you should have regular mammograms. Talk with your health care provider about your test results, treatment options, and if necessary, the need for more tests. Vaccines  Your health care provider may recommend certain vaccines, such as: Influenza vaccine. This is  recommended every year. Tetanus, diphtheria, and acellular pertussis (Tdap, Td) vaccine. You may need a Td booster every 10 years. Zoster vaccine. You may need this after age 70. Pneumococcal 13-valent conjugate (PCV13) vaccine. One dose is recommended after age 8. Pneumococcal polysaccharide (PPSV23) vaccine. One dose is recommended after age 73. Talk to your health care provider about which screenings and vaccines you need and how often you need them. This information is not intended to replace advice given to you by your health care provider. Make sure you discuss any questions you have with your health care provider. Document Released: 03/06/2015 Document  Revised: 10/28/2015 Document Reviewed: 12/09/2014 Elsevier Interactive Patient Education  2017 [place] Prevention in the Home Falls can cause injuries. They can happen to people of all ages. There are many things you can do to make your home safe and to help prevent falls. What can I do on the outside of my home? Regularly fix the edges of walkways and driveways and fix any cracks. Remove anything that might make you trip as you walk through a door, such as a raised step or threshold. Trim any bushes or trees on the path to your home. Use bright outdoor lighting. Clear any walking paths of anything that might make someone trip, such as rocks or tools. Regularly check to see if handrails are loose or broken. Make sure that both sides of any steps have handrails. Any raised decks and porches should have guardrails on the edges. Have any leaves, snow, or ice cleared regularly. Use sand or salt on walking paths during winter. Clean up any spills in your garage right away. This includes oil or grease spills. What can I do in the bathroom? Use night lights. Install grab bars by the toilet and in the tub and shower. Do not use towel bars as grab bars. Use non-skid mats or decals in the tub or shower. If you need to sit down in  the shower, use a plastic, non-slip stool. Keep the floor dry. Clean up any water that spills on the floor as soon as it happens. Remove soap buildup in the tub or shower regularly. Attach bath mats securely with double-sided non-slip rug tape. Do not have throw rugs and other things on the floor that can make you trip. What can I do in the bedroom? Use night lights. Make sure that you have a light by your bed that is easy to reach. Do not use any sheets or blankets that are too big for your bed. They should not hang down onto the floor. Have a firm chair that has side arms. You can use this for support while you get dressed. Do not have throw rugs and other things on the floor that can make you trip. What can I do in the kitchen? Clean up any spills right away. Avoid walking on wet floors. Keep items that you use a lot in easy-to-reach places. If you need to reach something above you, use a strong step stool that has a grab bar. Keep electrical cords out of the way. Do not use floor polish or wax that makes floors slippery. If you must use wax, use non-skid floor wax. Do not have throw rugs and other things on the floor that can make you trip. What can I do with my stairs? Do not leave any items on the stairs. Make sure that there are handrails on both sides of the stairs and use them. Fix handrails that are broken or loose. Make sure that handrails are as long as the stairways. Check any carpeting to make sure that it is firmly attached to the stairs. Fix any carpet that is loose or worn. Avoid having throw rugs at the top or bottom of the stairs. If you do have throw rugs, attach them to the floor with carpet tape. Make sure that you have a light switch at the top of the stairs and the bottom of the stairs. If you do not have them, ask someone to add them for you. What else can I do to help prevent falls? Wear shoes  that: Do not have high heels. Have rubber bottoms. Are comfortable  and fit you well. Are closed at the toe. Do not wear sandals. If you use a stepladder: Make sure that it is fully opened. Do not climb a closed stepladder. Make sure that both sides of the stepladder are locked into place. Ask someone to hold it for you, if possible. Clearly mark and make sure that you can see: Any grab bars or handrails. First and last steps. Where the edge of each step is. Use tools that help you move around (mobility aids) if they are needed. These include: Canes. Walkers. Scooters. Crutches. Turn on the lights when you go into a dark area. Replace any light bulbs as soon as they burn out. Set up your furniture so you have a clear path. Avoid moving your furniture around. If any of your floors are uneven, fix them. If there are any pets around you, be aware of where they are. Review your medicines with your doctor. Some medicines can make you feel dizzy. This can increase your chance of falling. Ask your doctor what other things that you can do to help prevent falls. This information is not intended to replace advice given to you by your health care provider. Make sure you discuss any questions you have with your health care provider. Document Released: 12/04/2008 Document Revised: 07/16/2015 Document Reviewed: 03/14/2014 Elsevier Interactive Patient Education  2017 [employer].

## 2022-05-09 ENCOUNTER — Other Ambulatory Visit: Payer: Self-pay | Admitting: Family Medicine

## 2022-05-14 ENCOUNTER — Other Ambulatory Visit: Payer: Self-pay | Admitting: Family Medicine

## 2022-05-19 ENCOUNTER — Encounter: Payer: Self-pay | Admitting: Family Medicine

## 2022-05-20 ENCOUNTER — Other Ambulatory Visit: Payer: Self-pay | Admitting: Family Medicine

## 2022-05-23 ENCOUNTER — Other Ambulatory Visit: Payer: Self-pay

## 2022-05-23 DIAGNOSIS — F325 Major depressive disorder, single episode, in full remission: Secondary | ICD-10-CM

## 2022-05-30 NOTE — Progress Notes (Signed)
NEUROLOGY CONSULTATION NOTE  [name] [name] MRN: [id number] DOB: [birth year]  Referring provider: Tana ConchStephen Hunter, MD Primary care provider: Tana ConchStephen Hunter, MD  Reason for consult:  migraine  Assessment/Plan:   Chronic daily headache  As she has a persistent headache for a year, will check MRI of brain Headache prevention:  start topiramate 25mg  at bedtime.  We can increase to 50mg  at bedtime in 4 weeks if needed. Migraine rescue:  flurbiprofen Limit use of pain relievers to no more than 2 days out of week to prevent risk of rebound or medication-overuse headache. Keep headache diary Follow up 6 months.    Subjective:  [name] [name] is a 67 year old female with CHF, CAD, bilateral sensorineural hearing loss, alcoholic cirrhosis, HTN, hypothyroidism, DM II with polyneuropathy, arthritis, depression and anxiety who presents for migraines.  History supplemented by referring provider's note.  Onset:  In young adulthood and resolved.  Restarted in early 2023.  Progressively gotten more frequent and has been persistent since mid 2023. Location:  usually across forehead, temples, back of he Quality:  pressure in front and back of head, sharp in temples Intensity:  mild to severe.   Aura:  absent Prodrome:  absent Associated symptoms:  May have photophobia and phonophobia but uncertain if separate from the headache.  She denies associated nausea, photophobia, unilateral numbness or weakness. Duration:  persistent but acute flare ups last 1-2 hours.  Frequency:  persistent/daily but more severe several times a week. Frequency of abortive medication: flurbiprofen daily for past month Triggers:  depression, anxiety Relieving factors:  flurbiprofen Activity:  does not aggravate  CBC and CMP from February reviewed.  LFTs okay.  GFR 58.25 with BUN 19 and Cr 1.  CBC unremarkable.   MRI of brain with and without contrast on 10/12/2015 to evaluate headaches was personally reviewed and  demonstrated mild to moderate chronic small vessel ischemic changes within the cerebral white matter as well as old small right cerebellar infarct.  Past NSAIDS/analgesics:  acetaminophen, ASA, celecoxib, ibuprofen, oxycodone Past abortive triptans:  none Past abortive ergotamine:  none Past muscle relaxants:  baclofen, methocarbamol Past antihypertensive medications:  amlodipine Past antidepressant medications:  citalopram Past anticonvulsant medications:  gabapentin, levetiracetam Past anti-CGRP:  none Past vitamins/Herbal/Supplements:  magnesium  Past antihistamines/decongestants:  cetirizine Other past therapies:  none  Current NSAIDS/analgesics:  flurbiprofen 100mg  BID PRN Current triptans:  none Current ergotamine:  none Current anti-emetic:  none Current muscle relaxants:  tizanidne 4mg  TID PRN Current Antihypertensive medications:  benazepril Current Antidepressant medications:  bupropion 37.5mg  BID, escitalopram 20mg  daily, trazodone 50mg  QHS (insomnia) Current Anticonvulsant medications:  none Current anti-CGRP:  none Current Vitamins/Herbal/Supplements:  D Current Antihistamines/Decongestants:  none Other therapy:  none Other medications:  levothyroxine, metformin, Jardiance   Caffeine:  no Diet:  at least a gallon of water daily.  Skips meals Exercise:  no Depression:  yes; Anxiety:  yes Sleep hygiene:  Poor.  Trazodone not too effective. Family history of headache:  adopted.      PAST MEDICAL HISTORY: Past Medical History:  Diagnosis Date   Acute meniscal tear of knee    Left knee   Alcohol problem drinking    rehab   Anemia    menstrual related   Anxiety    Arthritis    right ankle-neuropathy   Colon polyps    Depression    DM (diabetes mellitus), type 2 (HCC) 12/2009   type 2   Dyspnea    Evalluate for OSA (obstructive sleep  apnea) 08/15/2013   No OSA on sleep study but may be underestimated.-never used cpap     GERD (gastroesophageal reflux  disease)    not currently taking.   Hyperlipidemia    Hypertension    Hypothyroidism    Liver disease    Neuromuscular disorder (HCC)    neropathy bilateral feet.   Thyroid disease     PAST SURGICAL HISTORY: Past Surgical History:  Procedure Laterality Date   ADENOIDECTOMY     ANTERIOR CERVICAL DECOMP/DISCECTOMY FUSION N/A 01/18/2021   Procedure: Cervical five-six Cervical six-seven Anterior Cervical Decompression/ discectomy Fusion;  Surgeon: Tia Alert, MD;  Location: [facility] OR;  Service: Neurosurgery;  Laterality: N/A;   BREAST SURGERY Right    lumpectomy-benign   CESAREAN SECTION  1987   1 time   CHOLECYSTECTOMY     HAMMER TOE SURGERY     Dr. Victorino Dike [facility]- feb 2022   HAMMERTOE RECONSTRUCTION WITH WEIL OSTEOTOMY Bilateral 04/02/2020   Procedure: Bilateral 2-3 Weil and hammertoe corrections;  Surgeon: Toni Arthurs, MD;  Location: [facility];  Service: Orthopedics;  Laterality: Bilateral;   INNER EAR SURGERY     left ear had tubes put in and removed, scar tissue built up/ loss of hearing in left ear for over a year   MASS EXCISION Left 12/16/2013   Procedure: EXCISION LEFT  DISTAL THIGH MASS;  Surgeon: Shelda Pal, MD;  Location: [facility];  Service: Orthopedics;  Laterality: Left;   right ankle surgery      x 2- no retained hardware   right rotator cuff surgery      left side   TONSILLECTOMY     TOTAL KNEE ARTHROPLASTY  2009   right   TOTAL KNEE ARTHROPLASTY Left 05/05/2015   Procedure: LEFT TOTAL KNEE ARTHROPLASTY;  Surgeon: Durene Romans, MD;  Location: [facility];  Service: Orthopedics;  Laterality: Left;   TOTAL KNEE REVISION Right 12/16/2013   Procedure: RIGHT TOTAL KNEE REVISION POLY EXCHANGE;  Surgeon: Shelda Pal, MD;  Location: [facility];  Service: Orthopedics;  Laterality: Right;   TOTAL KNEE REVISION Left 08/15/2019   Procedure: LEFT KNEE REVISION;  Surgeon: Durene Romans, MD;  Location: [facility];  Service: Orthopedics;  Laterality: Left;  2 hrs    TUBAL LIGATION  1987    MEDICATIONS: Current Outpatient Medications on File Prior to Visit  Medication Sig Dispense Refill   benazepril (LOTENSIN) 10 MG tablet Take 1 tablet by mouth once daily 85 tablet 0   Blood Glucose Monitoring Suppl (ONETOUCH VERIO) w/Device KIT Use to test blood sugar daily 1 kit 0   buPROPion (WELLBUTRIN) 75 MG tablet Take 0.5 tablets (37.5 mg total) by mouth 2 (two) times daily. 90 tablet 3   Cholecalciferol (VITAMIN D HIGH POTENCY) 25 MCG (1000 UT) capsule Take 1,000 Units by mouth daily.     CRANBERRY PO Take 1 tablet by mouth daily.     empagliflozin (JARDIANCE) 10 MG TABS tablet Take 1 tablet (10 mg total) by mouth daily before breakfast. 90 tablet 3   escitalopram (LEXAPRO) 20 MG tablet Take 1 tablet by mouth once daily 90 tablet 0   flurbiprofen (ANSAID) 100 MG tablet TAKE 1 TABLET BY MOUTH TWICE DAILY AS NEEDED FOR HEADACHE 60 tablet 0   glucose blood (ONETOUCH VERIO) test strip USE TO CHECK BLOOD SUGAR DAILY 100 each 12   levothyroxine (SYNTHROID) 75 MCG tablet Take 1 tablet (75 mcg total) by mouth daily. 90 tablet 3  metFORMIN (GLUCOPHAGE-XR) 500 MG 24 hr tablet TAKE 1 TABLET BY MOUTH IN THE MORNING AND AT BEDTIME 180 tablet 0   OneTouch Delica Lancets 33G MISC Use to test blood sugar daily 100 each 5   rosuvastatin (CRESTOR) 20 MG tablet Take 1 tablet (20 mg total) by mouth daily. 90 tablet 3   simvastatin (ZOCOR) 20 MG tablet TAKE 1 TABLET BY MOUTH AT BEDTIME 90 tablet 0   tiZANidine (ZANAFLEX) 4 MG tablet Take 1 tablet (4 mg total) by mouth 3 (three) times daily as needed for muscle spasms. 270 tablet 1   traZODone (DESYREL) 50 MG tablet Take 1 tablet (50 mg total) by mouth at bedtime. 90 tablet 3   No current facility-administered medications on file prior to visit.    ALLERGIES: Allergies  Allergen Reactions   Adhesive [Tape] Other (See Comments)    blisters   Nitrofurantoin     Diffuse rash   Other Other (See Comments)    (Neoprene)--causes  blisters.     FAMILY HISTORY: Family History  Adopted: Yes    Objective:  Blood pressure (!) 88/49, pulse (!) 52, height 5\' 6"  (1.676 m), weight 198 lb 6.4 oz (90 kg), SpO2 97 %. General: No acute distress.  Patient appears well-groomed.   Head:  Normocephalic/atraumatic Eyes:  fundi examined but not visualized Neck: supple, no paraspinal tenderness, full range of motion Back: No paraspinal tenderness Heart: regular rate and rhythm Lungs: Clear to auscultation bilaterally. Vascular: No carotid bruits. Neurological Exam: Mental status: alert and oriented to person, place, and time, speech fluent and not dysarthric, language intact. Cranial nerves: CN I: not tested CN II: pupils equal, round and reactive to light, visual fields intact CN III, IV, VI:  full range of motion, no nystagmus, no ptosis CN V: facial sensation intact. CN VII: upper and lower face symmetric CN VIII: hearing intact CN IX, X: gag intact, uvula midline CN XI: sternocleidomastoid and trapezius muscles intact CN XII: tongue midline Bulk & Tone: normal, no fasciculations. Motor:  muscle strength 5/5 throughout Sensation:  Pinprick sensation slightly reduced; vibratory sensation intact. Deep Tendon Reflexes:  2+ throughout,  toes downgoing.   Finger to nose testing:  Without dysmetria.   Heel to shin:  Without dysmetria.   Gait:  Normal station and stride.  Romberg negative.    Thank you for allowing me to take part in the care of this patient.  Shon MilletAdam Amore Ackman, DO  CC: Tana ConchStephen Hunter, MD

## 2022-06-07 ENCOUNTER — Ambulatory Visit (INDEPENDENT_AMBULATORY_CARE_PROVIDER_SITE_OTHER): Payer: Medicare Other | Admitting: Neurology

## 2022-06-07 ENCOUNTER — Encounter: Payer: Self-pay | Admitting: Neurology

## 2022-06-07 VITALS — BP 88/49 | HR 52 | Ht 66.0 in | Wt 198.4 lb

## 2022-06-07 DIAGNOSIS — R519 Headache, unspecified: Secondary | ICD-10-CM

## 2022-06-07 MED ORDER — TOPIRAMATE 25 MG PO TABS: 25.0000 mg | ORAL_TABLET | Freq: Every day | ORAL | 5 refills | Status: AC

## 2022-06-07 NOTE — Patient Instructions (Signed)
Start topiramate  at bedtime.  If no improvement in 4 weeks, contact me and we can increase dose Take flurbiprofen as needed but limit use of pain relievers to no more than 2 days out of week to prevent risk of rebound or medication-overuse headache. Check MRI of brain without contrast Follow up 6 months.

## 2022-06-14 ENCOUNTER — Ambulatory Visit (HOSPITAL_BASED_OUTPATIENT_CLINIC_OR_DEPARTMENT_OTHER): Payer: Medicare Other | Admitting: Psychiatry

## 2022-06-14 ENCOUNTER — Encounter (HOSPITAL_COMMUNITY): Payer: Self-pay | Admitting: Psychiatry

## 2022-06-14 VITALS — Wt 198.0 lb

## 2022-06-14 DIAGNOSIS — F1020 Alcohol dependence, uncomplicated: Secondary | ICD-10-CM

## 2022-06-14 DIAGNOSIS — F325 Major depressive disorder, single episode, in full remission: Secondary | ICD-10-CM | POA: Diagnosis not present

## 2022-06-14 DIAGNOSIS — F411 Generalized anxiety disorder: Secondary | ICD-10-CM | POA: Diagnosis not present

## 2022-06-14 DIAGNOSIS — F4312 Post-traumatic stress disorder, chronic: Secondary | ICD-10-CM | POA: Diagnosis not present

## 2022-06-14 MED ORDER — ACAMPROSATE CALCIUM 333 MG PO TBEC
666.0000 mg | DELAYED_RELEASE_TABLET | Freq: Three times a day (TID) | ORAL | 0 refills | Status: DC
Start: 2022-06-14 — End: 2022-07-05

## 2022-06-14 MED ORDER — SERTRALINE HCL 50 MG PO TABS
50.0000 mg | ORAL_TABLET | Freq: Every day | ORAL | 0 refills | Status: DC
Start: 2022-06-14 — End: 2022-07-05
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
## 2022-06-15 ENCOUNTER — Ambulatory Visit
Admission: RE | Admit: 2022-06-15 | Discharge: 2022-06-15 | Disposition: A | Discharge status: Home / Self Care | Payer: Medicare Other | Source: Ambulatory Visit | Attending: Neurology | Admitting: Neurology

## 2022-06-15 DIAGNOSIS — R519 Headache, unspecified: Secondary | ICD-10-CM

## 2022-06-16 ENCOUNTER — Ambulatory Visit: Payer: Medicare Other | Admitting: Internal Medicine

## 2022-06-16 NOTE — Progress Notes (Signed)
LMOVM for patient please give [place] a call.

## 2022-06-16 NOTE — Progress Notes (Deleted)
Name: [name] [name]  MRN/ DOB: [id number], 08/19/1955   Age/ Sex: 67 y.o., female    PCP: Shelva Majestic, MD   Reason for Endocrinology Evaluation: Type 2 Diabetes Mellitus     Date of Initial Endocrinology Visit: 06/16/2022     PATIENT IDENTIFIER: [name] [name] is a 67 y.o. female with a past medical history of DM, alcoholic cirrhosis, HTN. The patient presented for initial endocrinology clinic visit on 06/16/2022 for consultative assistance with her diabetes management.    HPI: [name] [name] was    Diagnosed with DM 2015 Prior Medications tried/Intolerance: *** Currently checking blood sugars *** x / day,  before breakfast and ***.  Hypoglycemia episodes : ***               Symptoms: ***                 Frequency: ***/  Hemoglobin A1c has ranged from 5.3% in 2022, peaking at 12.0% in 2016. Patient required assistance for hypoglycemia:  Patient has required hospitalization within the last 1 year from hyper or hypoglycemia:   In terms of diet, the patient ***  Patient follows with behavioral health for depression Follows with neurology for headaches  THYROID HISTORY : She was diagnosed with multinodular goiter based on thyroid ultrasound in 2014.  She is s/p FNA of the right superior nodule with scant cellularity.   No follow-up since then through epic She is on LT-4 replacement chronically  HOME DIABETES REGIMEN: Metformin 500 mg XR Jardiance 10 mg daily   Statin: Yes ACE-I/ARB: Yes   METER DOWNLOAD SUMMARY: Date range evaluated: *** Fingerstick Blood Glucose Tests = *** Average Number Tests/Day = *** Overall Mean FS Glucose = *** Standard Deviation = ***  BG Ranges: Low = *** High = ***   Hypoglycemic Events/30 Days: BG < 50 = *** Episodes of symptomatic severe hypoglycemia = ***   DIABETIC COMPLICATIONS: Microvascular complications:  CKD III Denies: *** Last eye exam: Completed   Macrovascular complications:  *** Denies: CAD, PVD,  CVA   PAST HISTORY: Past Medical History:  Past Medical History:  Diagnosis Date   Acute meniscal tear of knee    Left knee   Alcohol problem drinking    rehab   Anemia    menstrual related   Anxiety    Arthritis    right ankle-neuropathy   Colon polyps    Depression    DM (diabetes mellitus), type 2 12/2009   type 2   Dyspnea    Evalluate for OSA (obstructive sleep apnea) 08/15/2013   No OSA on sleep study but may be underestimated.-never used cpap     GERD (gastroesophageal reflux disease)    not currently taking.   Hyperlipidemia    Hypertension    Hypothyroidism    Liver disease    Neuromuscular disorder    neropathy bilateral feet.   Thyroid disease    Past Surgical History:  Past Surgical History:  Procedure Laterality Date   ADENOIDECTOMY     ANTERIOR CERVICAL DECOMP/DISCECTOMY FUSION N/A 01/18/2021   Procedure: Cervical five-six Cervical six-seven Anterior Cervical Decompression/ discectomy Fusion;  Surgeon: Tia Alert, MD;  Location: [facility];  Service: Neurosurgery;  Laterality: N/A;   BREAST SURGERY Right    lumpectomy-benign   CESAREAN SECTION  1987   1 time   CHOLECYSTECTOMY     HAMMER TOE SURGERY     Dr. Victorino Dike [facility]- feb 2022   HAMMERTOE RECONSTRUCTION WITH WEIL OSTEOTOMY  Bilateral 04/02/2020   Procedure: Bilateral 2-3 Weil and hammertoe corrections;  Surgeon: Toni Arthurs, MD;  Location: [facility];  Service: Orthopedics;  Laterality: Bilateral;   INNER EAR SURGERY     left ear had tubes put in and removed, scar tissue built up/ loss of hearing in left ear for over a year   MASS EXCISION Left 12/16/2013   Procedure: EXCISION LEFT  DISTAL THIGH MASS;  Surgeon: Shelda Pal, MD;  Location: [facility];  Service: Orthopedics;  Laterality: Left;   right ankle surgery      x 2- no retained hardware   right rotator cuff surgery      left side   TONSILLECTOMY     TOTAL KNEE ARTHROPLASTY  2009   right   TOTAL KNEE ARTHROPLASTY Left  05/05/2015   Procedure: LEFT TOTAL KNEE ARTHROPLASTY;  Surgeon: Durene Romans, MD;  Location: [facility];  Service: Orthopedics;  Laterality: Left;   TOTAL KNEE REVISION Right 12/16/2013   Procedure: RIGHT TOTAL KNEE REVISION POLY EXCHANGE;  Surgeon: Shelda Pal, MD;  Location: [facility];  Service: Orthopedics;  Laterality: Right;   TOTAL KNEE REVISION Left 08/15/2019   Procedure: LEFT KNEE REVISION;  Surgeon: Durene Romans, MD;  Location: [facility];  Service: Orthopedics;  Laterality: Left;  2 hrs   TUBAL LIGATION  1987    Social History:  reports that she quit smoking about 13 years ago. Her smoking use included cigarettes. She started smoking about 55 years ago. She has a 45.00 pack-year smoking history. She has never used smokeless tobacco. She reports that she does not currently use alcohol. She reports that she does not currently use drugs after having used the following drugs: Marijuana. Family History:  Family History  Adopted: Yes     HOME MEDICATIONS: Allergies as of 06/16/2022       Reactions   Adhesive [tape] Other (See Comments)   blisters   Nitrofurantoin    Diffuse rash   Other Other (See Comments)   (Neoprene)--causes blisters.         Medication List        Accurate as of June 16, 2022  7:25 AM. If you have any questions, ask your nurse or doctor.          acamprosate 333 MG tablet Commonly known as: CAMPRAL Take 2 tablets (666 mg total) by mouth 3 (three) times daily with meals.   benazepril 10 MG tablet Commonly known as: LOTENSIN Take 1 tablet by mouth once daily   buPROPion 75 MG tablet Commonly known as: WELLBUTRIN Take 0.5 tablets (37.5 mg total) by mouth 2 (two) times daily. What changed:  how much to take when to take this   CRANBERRY PO Take 1 tablet by mouth daily.   empagliflozin 10 MG Tabs tablet Commonly known as: Jardiance Take 1 tablet (10 mg total) by mouth daily before breakfast.   escitalopram 20 MG tablet Commonly known as:  LEXAPRO Take 1 tablet by mouth once daily   flurbiprofen 100 MG tablet Commonly known as: ANSAID TAKE 1 TABLET BY MOUTH TWICE DAILY AS NEEDED FOR HEADACHE   levothyroxine 75 MCG tablet Commonly known as: SYNTHROID Take 1 tablet (75 mcg total) by mouth daily.   metFORMIN 500 MG 24 hr tablet Commonly known as: GLUCOPHAGE-XR TAKE 1 TABLET BY MOUTH IN THE MORNING AND AT BEDTIME   OneTouch Delica Lancets 33G Misc Use to test blood sugar daily   OneTouch Verio test strip Generic  drug: glucose blood USE TO CHECK BLOOD SUGAR DAILY   OneTouch Verio w/Device Kit Use to test blood sugar daily   rosuvastatin 20 MG tablet Commonly known as: CRESTOR Take 1 tablet (20 mg total) by mouth daily.   sertraline 50 MG tablet Commonly known as: Zoloft Take 1 tablet (50 mg total) by mouth daily.   tiZANidine 4 MG tablet Commonly known as: ZANAFLEX Take 1 tablet (4 mg total) by mouth 3 (three) times daily as needed for muscle spasms.   topiramate 25 MG tablet Commonly known as: TOPAMAX Take 1 tablet (25 mg total) by mouth at bedtime.   traZODone 50 MG tablet Commonly known as: DESYREL Take 1 tablet (50 mg total) by mouth at bedtime.   Vitamin D High Potency 25 MCG (1000 UT) capsule Generic drug: Cholecalciferol Take 1,000 Units by mouth daily.         ALLERGIES: Allergies  Allergen Reactions   Adhesive [Tape] Other (See Comments)    blisters   Nitrofurantoin     Diffuse rash   Other Other (See Comments)    (Neoprene)--causes blisters.      REVIEW OF SYSTEMS: A comprehensive ROS was conducted with the patient and is negative except as per HPI and below:  ROS    OBJECTIVE:   VITAL SIGNS: There were no vitals taken for this visit.   PHYSICAL EXAM:  General: Pt appears well and is in NAD  Neck: General: Supple without adenopathy or carotid bruits. Thyroid: Thyroid size normal.  No goiter or nodules appreciated.   Lungs: Clear with good BS bilat with no rales,  rhonchi, or wheezes  Heart: RRR   Abdomen:  soft, nontender  Extremities:  Lower extremities - No pretibial edema. No lesions.  Skin: Normal texture and temperature to palpation. No rash noted.  Neuro: MS is good with appropriate affect, pt is alert and Ox3    DM foot exam:    DATA REVIEWED:  Lab Results  Component Value Date   HGBA1C 5.9 03/31/2022   HGBA1C 9.8 (H) 12/13/2021   HGBA1C 5.5 06/21/2021    Latest Reference Range & Units 03/31/22 12:36  Sodium 135 - 145 mEq/L 137  Potassium 3.5 - 5.1 mEq/L 4.1  Chloride 96 - 112 mEq/L 103  CO2 19 - 32 mEq/L 25  Glucose 70 - 99 mg/dL 161 (H)  BUN 6 - 23 mg/dL 19  Creatinine 0.96 - 0.45 mg/dL 4.09  Calcium 8.4 - 81.1 mg/dL 9.9  Alkaline Phosphatase 39 - 117 U/L 57  Albumin 3.5 - 5.2 g/dL 3.7  AST 0 - 37 U/L 24  ALT 0 - 35 U/L 15  Total Protein 6.0 - 8.3 g/dL 6.0  Total Bilirubin 0.2 - 1.2 mg/dL 0.9  GFR >91.47 mL/min 58.25 (L)  (H): Data is abnormally high (L): Data is abnormally low ASSESSMENT / PLAN / RECOMMENDATIONS:   1) Type 2 Diabetes Mellitus, ***controlled, With CKD III complications - Most recent A1c of *** %. Goal A1c < 7.0 %.    Plan: GENERAL: ***  MEDICATIONS: ***  EDUCATION / INSTRUCTIONS: BG monitoring instructions: Patient is instructed to check her blood sugars *** times a day, ***. Call  [facility] if: BG persistently < 70  I reviewed the Rule of 15 for the treatment of hypoglycemia in detail with the patient. Literature supplied.   2) Diabetic complications:  Eye: Does *** have known diabetic retinopathy.  Neuro/ Feet: Does *** have known diabetic peripheral neuropathy. Renal: Patient does ***  have known baseline CKD. She is *** on an ACEI/ARB at present.  3) Lipids: Patient is *** on a statin.    4) Hypertension: ***  at goal of < 140/90 mmHg.       Signed electronically by: Lyndle Herrlich, MD  [facility]  [facility] [address] [place], [place] [address] Phone: [phone] FAX: [phone]   CC: Shelva Majestic, MD [address] [place] [address] Phone: [phone]  Fax: [phone]    Return to Endocrinology clinic as below: Future Appointments  Date Time Provider Department Center  06/16/2022 10:50 AM Rhiannon Sassaman, Konrad Dolores, MD [facility] None  06/20/2022  9:15 AM [facility] [place] 2 [id number] [address] [facility]  06/24/2022  1:30 PM [facility] CARE [facility]  07/05/2022 11:30 AM Arfeen, Phillips Grout, MD [facility] None  07/29/2022  1:00 PM Shelva Majestic, MD [facility]  12/12/2022  2:30 PM Drema Dallas, DO [facility] None  05/03/2023  2:30 PM [facility] ANNUAL WELLNESS VISIT [facility]

## 2022-06-20 ENCOUNTER — Ambulatory Visit
Admission: RE | Admit: 2022-06-20 | Discharge: 2022-06-20 | Disposition: A | Discharge status: Home / Self Care | Payer: Medicare Other | Source: Ambulatory Visit | Attending: Family Medicine | Admitting: Family Medicine

## 2022-06-20 DIAGNOSIS — K7030 Alcoholic cirrhosis of liver without ascites: Secondary | ICD-10-CM

## 2022-06-20 NOTE — Progress Notes (Signed)
Patient advised of her Mri results.

## 2022-06-20 NOTE — Telephone Encounter (Signed)
Pt is returning your call please call °

## 2022-06-21 ENCOUNTER — Other Ambulatory Visit: Payer: Self-pay | Admitting: Family Medicine

## 2022-06-23 ENCOUNTER — Other Ambulatory Visit: Payer: Self-pay | Admitting: Family Medicine

## 2022-06-23 DIAGNOSIS — K700 Alcoholic fatty liver: Secondary | ICD-10-CM

## 2022-06-24 ENCOUNTER — Ambulatory Visit (INDEPENDENT_AMBULATORY_CARE_PROVIDER_SITE_OTHER): Payer: Medicare Other | Admitting: *Deleted

## 2022-06-24 DIAGNOSIS — E119 Type 2 diabetes mellitus without complications: Secondary | ICD-10-CM

## 2022-06-24 DIAGNOSIS — I5032 Chronic diastolic (congestive) heart failure: Secondary | ICD-10-CM

## 2022-06-24 NOTE — Patient Instructions (Signed)
Please call the care guide team at [phone] if you need to cancel or reschedule your appointment.   If you are experiencing a Mental Health or Behavioral Health Crisis or need someone to talk to, please call the Suicide and Crisis Lifeline: 988 call the [place] National Suicide Prevention Lifeline: 925-320-2274 or TTY: 580-251-0170 TTY 6406570856) to talk to a trained counselor call 1-800-273-TALK (toll free, 24 hour hotline) go to [facility] [address] [place] [phone]) call 911   Following is a copy of the CCM Program Consent:  CCM service includes personalized support from designated clinical staff supervised by the physician, including individualized plan of care and coordination with other care providers 24/7 contact phone numbers for assistance for urgent and routine care needs. Service will only be billed when office clinical staff spend 20 minutes or more in a month to coordinate care. Only one practitioner may furnish and bill the service in a calendar month. The patient may stop CCM services at amy time (effective at the end of the month) by phone call to the office staff. The patient will be responsible for cost sharing (co-pay) or up to 20% of the service fee (after annual deductible is met)  Following is a copy of your full provider care plan:   Goals Addressed             This Visit's Progress    CCM (CONGESTIVE HEART FAILURE) EXPECTED OUTCOME: MONITOR, SELF-MANAGE AND REDUCE SYMPTOMS OF CONGESTIVE HEART FAILURE       Current Barriers:  Knowledge Deficits related to Congestive Heart Failure management Chronic Disease Management support and education needs related to Congestive Heart Failure, diet Patient reports she has scale and continues to weigh daily and record with weight ranges 193-197 pounds Patient reports she has all medication and taking as prescribed, pt reports lexapro was discontinued and is taking campral  TID for alcohol cravings and states this has worked for her, pt states 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 Lolly Mustache for depression management. Patient reports she saw Dr. Everlena Cooper for chronic headaches and had MRI, is now on topamax and will take for approximately 4 weeks to "see if this helps and if it doesn't I will let doctor know" Patient reports she had liver ultrasound and "was inconclusive" and will be following up with [facility]  Planned Interventions: Provided education on low sodium diet Provided education about placing scale on hard, flat surface Advised patient to weigh each morning after emptying bladder Discussed importance of daily weight and advised patient to weigh and record daily Discussed the importance of keeping all appointments with provider Provided patient with education about the role of exercise in the management of heart failure Reviewed Heart Failure action plan with emphasis on yellow zone Reviewed importance for continued sobriety and following up with Dr. Lolly Mustache [facility]  Symptom Management: Take medications as prescribed   Attend all scheduled provider appointments Call pharmacy for medication refills 3-7 days in advance of running out of medications Attend church or other social activities Perform all self care activities independently  Perform IADL's (shopping, preparing meals, housekeeping, managing finances) independently Call provider office for new concerns or questions  call office if I gain more than 2 pounds in one day or 5 pounds in one week keep legs up while sitting track weight in diary use salt in moderation watch for  swelling in feet, ankles and legs every day weigh myself daily develop a rescue plan follow rescue plan if symptoms flare-up eat more whole grains, fruits and vegetables, lean meats and healthy fats know when to call the  doctor- when in the yellow zone (Heart failure action plan) track symptoms and what helps feel better or worse Follow heart failure action plan Know how you feel each day and call your doctor early on for change in health status, symptoms Let your doctor know if topamax does not help the headaches  Follow Up Plan: Telephone follow up appointment with care management team member scheduled for:  09/22/22 at 215 pm       CCM (DIABETES) EXPECTED OUTCOME:  MONITOR, SELF-MANAGE AND REDUCE SYMPTOMS OF DIABETES       Current Barriers:  Knowledge Deficits related to Diabetes management Chronic Disease Management support and education needs related to Diabetes, diet, working towards decreasing [facility] Patient reports she lives with spouse [name], states she is independent with all aspects of her care, continues to drive, tries to walk 2-3 times per week, does not drink sugary drinks, only water. Patient states she checks CBG once daily fasting and ranges 98-153, pt states AIC is down to 5.9 on 03/31/22, down from 9.8, pt is excited to see improvement in [facility] Patient reports yeast infection resolved  Planned Interventions: Reviewed medications with patient and discussed importance of medication adherence;        Reviewed prescribed diet with patient carbohydrate modified, plate method; Counseled on importance of regular laboratory monitoring as prescribed;        Advised patient, providing education and rationale, to check cbg once daily and record        Review of patient status, including review of consultants reports, relevant laboratory and other test results, and medications completed;       Advised patient to discuss any issues with blood sugar with provider;      Reviewed upcoming scheduled appointments Reinforced that infection can elevate blood sugar Reviewed all upcoming scheduled appointments  Symptom Management: Take medications as prescribed   Attend all scheduled provider appointments Call  pharmacy for medication refills 3-7 days in advance of running out of medications Attend church or other social activities Perform all self care activities independently  Perform IADL's (shopping, preparing meals, housekeeping, managing finances) independently Call provider office for new concerns or questions  check blood sugar at prescribed times: once daily check feet daily for cuts, sores or redness enter blood sugar readings and medication or insulin into daily log take the blood sugar log to all doctor visits take the blood sugar meter to all doctor visits trim toenails straight across eat fish at least once per week fill half of plate with vegetables limit fast food meals to no more than 1 per week manage portion size prepare main meal at home 3 to 5 days each week read food labels for fat, fiber, carbohydrates and portion size set a realistic goal Continue walking as much as you can, weather permitting  Follow Up Plan: Telephone follow up appointment with care management team member scheduled for:  09/22/22 at 215 pm          Patient verbalizes understanding of instructions and care plan provided today and agrees to view in MyChart. Active MyChart status and patient understanding of how to access instructions and care plan via MyChart confirmed with patient.  Telephone follow up appointment with care management team member scheduled for:  09/22/22 at  215 pm

## 2022-06-24 NOTE — Chronic Care Management (AMB) (Signed)
Chronic Care Management   CCM RN Visit Note  06/24/2022 Name: [name] [name] MRN: [id number] DOB: 10-17-1955  Subjective: [name] [name] is a 67 y.o. year old female who is a primary care patient of [name] Majestic, MD. The patient was referred to the Chronic Care Management team for assistance with care management needs subsequent to provider initiation of CCM services and plan of care.    Today's Visit:  Engaged with patient by telephone for follow up visit.        Goals Addressed             This Visit's Progress    CCM (CONGESTIVE HEART FAILURE) EXPECTED OUTCOME: MONITOR, SELF-MANAGE AND REDUCE SYMPTOMS OF CONGESTIVE HEART FAILURE       Current Barriers:  Knowledge Deficits related to Congestive Heart Failure management Chronic Disease Management support and education needs related to Congestive Heart Failure, diet Patient reports she has scale and continues to weigh daily and record with weight ranges 193-197 pounds Patient reports she has all medication and taking as prescribed, pt reports lexapro was discontinued and is taking campral TID for alcohol cravings and states this has worked for her, pt states 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 Lolly Mustache for depression management. Patient reports she saw Dr. Everlena Cooper for chronic headaches and had MRI, is now on topamax and will take for approximately 4 weeks to "see if this helps and if it doesn't I will let doctor know" Patient reports she had liver ultrasound and "was inconclusive" and will be following up with [facility]  Planned Interventions: Provided education on low sodium diet Provided education about placing scale on hard, flat surface Advised patient to weigh each morning after emptying bladder Discussed importance of daily weight and advised patient to weigh and record daily Discussed the importance of keeping all appointments  with provider Provided patient with education about the role of exercise in the management of heart failure Reviewed Heart Failure action plan with emphasis on yellow zone Reviewed importance for continued sobriety and following up with Dr. Lolly [facility]  Symptom Management: Take medications as prescribed   Attend all scheduled provider appointments Call pharmacy for medication refills 3-7 days in advance of running out of medications Attend church or other social activities Perform all self care activities independently  Perform IADL's (shopping, preparing meals, housekeeping, managing finances) independently Call provider office for new concerns or questions  call office if I gain more than 2 pounds in one day or 5 pounds in one week keep legs up while sitting track weight in diary use salt in moderation watch for swelling in feet, ankles and legs every day weigh myself daily develop a rescue plan follow rescue plan if symptoms flare-up eat more whole grains, fruits and vegetables, lean meats and healthy fats know when to call the doctor- when in the yellow zone (Heart failure action plan) track symptoms and what helps feel better or worse Follow heart failure action plan Know how you feel each day and call your doctor early on for change in health status, symptoms Let your doctor know if topamax does not help the headaches  Follow Up Plan: Telephone follow up appointment with care management team member scheduled for:  09/22/22 at 215 pm       CCM (DIABETES) EXPECTED OUTCOME:  MONITOR, SELF-MANAGE AND REDUCE SYMPTOMS OF DIABETES  Current Barriers:  Knowledge Deficits related to Diabetes management Chronic Disease Management support and education needs related to Diabetes, diet, working towards decreasing [facility] Patient reports she lives with spouse [name], states she is independent with all aspects of her care, continues to drive, tries to walk 2-3 times per week, does  not drink sugary drinks, only water. Patient states she checks CBG once daily fasting and ranges 98-153, pt states AIC is down to 5.9 on 03/31/22, down from 9.8, pt is excited to see improvement in [facility] Patient reports yeast infection resolved  Planned Interventions: Reviewed medications with patient and discussed importance of medication adherence;        Reviewed prescribed diet with patient carbohydrate modified, plate method; Counseled on importance of regular laboratory monitoring as prescribed;        Advised patient, providing education and rationale, to check cbg once daily and record        Review of patient status, including review of consultants reports, relevant laboratory and other test results, and medications completed;       Advised patient to discuss any issues with blood sugar with provider;      Reviewed upcoming scheduled appointments Reinforced that infection can elevate blood sugar Reviewed all upcoming scheduled appointments  Symptom Management: Take medications as prescribed   Attend all scheduled provider appointments Call pharmacy for medication refills 3-7 days in advance of running out of medications Attend church or other social activities Perform all self care activities independently  Perform IADL's (shopping, preparing meals, housekeeping, managing finances) independently Call provider office for new concerns or questions  check blood sugar at prescribed times: once daily check feet daily for cuts, sores or redness enter blood sugar readings and medication or insulin into daily log take the blood sugar log to all doctor visits take the blood sugar meter to all doctor visits trim toenails straight across eat fish at least once per week fill half of plate with vegetables limit fast food meals to no more than 1 per week manage portion size prepare main meal at home 3 to 5 days each week read food labels for fat, fiber, carbohydrates and portion size set a  realistic goal Continue walking as much as you can, weather permitting  Follow Up Plan: Telephone follow up appointment with care management team member scheduled for:  09/22/22 at 215 pm          Plan:Telephone follow up appointment with care management team member scheduled for:  09/22/22 at 215 pm  Irving Shows [facility], BSN RN Case Physicist, medical Healthcare at [employer] [phone]

## 2022-06-29 ENCOUNTER — Encounter: Payer: Self-pay | Admitting: Family Medicine

## 2022-07-02 ENCOUNTER — Other Ambulatory Visit: Payer: Self-pay | Admitting: Family Medicine

## 2022-07-02 DIAGNOSIS — E039 Hypothyroidism, unspecified: Secondary | ICD-10-CM

## 2022-07-03 ENCOUNTER — Other Ambulatory Visit (HOSPITAL_COMMUNITY): Payer: Self-pay | Admitting: Psychiatry

## 2022-07-03 DIAGNOSIS — F4312 Post-traumatic stress disorder, chronic: Secondary | ICD-10-CM

## 2022-07-03 DIAGNOSIS — F411 Generalized anxiety disorder: Secondary | ICD-10-CM

## 2022-07-03 DIAGNOSIS — F325 Major depressive disorder, single episode, in full remission: Secondary | ICD-10-CM

## 2022-07-05 ENCOUNTER — Encounter (HOSPITAL_COMMUNITY): Payer: Self-pay | Admitting: Psychiatry

## 2022-07-05 ENCOUNTER — Telehealth (HOSPITAL_BASED_OUTPATIENT_CLINIC_OR_DEPARTMENT_OTHER): Payer: Medicare Other | Admitting: Psychiatry

## 2022-07-05 VITALS — Wt 195.0 lb

## 2022-07-05 DIAGNOSIS — F1020 Alcohol dependence, uncomplicated: Secondary | ICD-10-CM

## 2022-07-05 DIAGNOSIS — F4312 Post-traumatic stress disorder, chronic: Secondary | ICD-10-CM

## 2022-07-05 DIAGNOSIS — F325 Major depressive disorder, single episode, in full remission: Secondary | ICD-10-CM | POA: Diagnosis not present

## 2022-07-05 DIAGNOSIS — F411 Generalized anxiety disorder: Secondary | ICD-10-CM | POA: Diagnosis not present

## 2022-07-05 MED ORDER — SERTRALINE HCL 100 MG PO TABS
100.0000 mg | ORAL_TABLET | Freq: Every day | ORAL | 1 refills | Status: AC
Start: 2022-07-05 — End: 2022-09-03

## 2022-07-05 MED ORDER — BUPROPION HCL 75 MG PO TABS
75.0000 mg | ORAL_TABLET | Freq: Every day | ORAL | 0 refills | Status: AC
Start: 2022-07-05 — End: ?

## 2022-07-05 MED ORDER — ACAMPROSATE CALCIUM 333 MG PO TBEC
666.0000 mg | DELAYED_RELEASE_TABLET | Freq: Three times a day (TID) | ORAL | 0 refills | Status: AC
Start: 2022-07-05 — End: ?

## 2022-07-05 NOTE — Progress Notes (Signed)
[facility] MD Virtual Progress Note   Patient Location: Home Provider Location: Home Office  I connect with patient by video and verified that I am speaking with correct person by using two identifiers. I discussed the limitations of evaluation and management by telemedicine and the availability of in person appointments. I also discussed with the patient that there may be a patient responsible charge related to this service. The patient expressed understanding and agreed to proceed.  [name] [name] [id number] 67 y.o.  07/05/2022 11:39 AM  History of Present Illness:  [name] [name] is 67 year old Caucasian, married, retired female who was referred from primary care physician for the management of anxiety and depression.  She was seen first time 4 weeks ago.  She was taking Wellbutrin, Lexapro, trazodone however continues to struggle with anxiety depression and fatigue.  She had a history of alcohol dependence and after a sobriety she restart drinking again after having issues with her sponsor.  Patient also lost her mother in 2014 and had a lot of grief about the loss.  Lately she had noticed more isolated, withdrawn, having crying spells, poor sleep and racing thoughts.  She reported fatigue and lack of energy and motivation to do things.  Patient diagnosed with fatty liver, headaches.  We started her on Zoloft which had helped her in the past and Campral to cut down the craving.  Recommend to discontinue Lexapro.  She is doing better.  She is more active and since the last visit she has not drinking.  Her cravings are subsided the Campral.  She also noticed more motivation and lost few pounds since the last visit.  Her goal is to keep the weight around 175.  She is taking Topamax which is helping her headaches.  Sometimes she feels tired next day and try to take a nap during the day.  She has appointment coming up with the therapist in few weeks.  Patient also excited about going to 2-week  cruise trip to [place].  She denies any hallucination, paranoia or any suicidal thoughts.  She has a history of PTSD and occasionally has nightmares and flashback.  She is tolerating Zoloft and reported no tremors, shakes or any EPS.  She denies any panic attack.  She lives with her husband which has been married for 38 years.  Patient and her husband is retired.  Past Psychiatric History: History of PTSD, depression, anxiety and ETOH dependence. H/O drug rehab for 30 days at [facility] and also did CDIOP in 2016. No history of suicidal attempt or inpatient psychiatric treatment. Tried Camprall, Zoloft. PCP gave Xanax, Zoloft, Lexapro, Wellbutrin and trazodone. No history of psychosis, mania, DUI and seizures.   Outpatient Encounter Medications as of 07/05/2022  Medication Sig   acamprosate (CAMPRAL) 333 MG tablet Take 2 tablets (666 mg total) by mouth 3 (three) times daily with meals.   benazepril (LOTENSIN) 10 MG tablet Take 1 tablet by mouth once daily   Blood Glucose Monitoring Suppl (ONETOUCH VERIO) w/Device KIT Use to test blood sugar daily   buPROPion (WELLBUTRIN) 75 MG tablet Take 0.5 tablets (37.5 mg total) by mouth 2 (two) times daily. (Patient taking differently: Take 75 mg by mouth daily at 8 pm.)   Cholecalciferol (VITAMIN D HIGH POTENCY) 25 MCG (1000 UT) capsule Take 1,000 Units by mouth daily.   CRANBERRY PO Take 1 tablet by mouth daily.   empagliflozin (JARDIANCE) 10 MG TABS tablet Take 1 tablet (10 mg total) by mouth daily before  breakfast.   escitalopram (LEXAPRO) 20 MG tablet Take 1 tablet by mouth once daily (Patient not taking: Reported on 06/14/2022)   flurbiprofen (ANSAID) 100 MG tablet TAKE 1 TABLET BY MOUTH TWICE DAILY AS NEEDED FOR HEADACHE   glucose blood (ONETOUCH VERIO) test strip USE TO CHECK BLOOD SUGAR DAILY   levothyroxine (SYNTHROID) 75 MCG tablet Take 1 tablet by mouth once daily   metFORMIN (GLUCOPHAGE-XR) 500 MG 24 hr tablet TAKE 1 TABLET BY MOUTH  IN THE MORNING AND AT BEDTIME   OneTouch Delica Lancets 33G MISC Use to test blood sugar daily   rosuvastatin (CRESTOR) 20 MG tablet Take 1 tablet (20 mg total) by mouth daily.   sertraline (ZOLOFT) 50 MG tablet Take 1 tablet (50 mg total) by mouth daily.   tiZANidine (ZANAFLEX) 4 MG tablet Take 1 tablet (4 mg total) by mouth 3 (three) times daily as needed for muscle spasms.   topiramate (TOPAMAX) 25 MG tablet Take 1 tablet (25 mg total) by mouth at bedtime.   traZODone (DESYREL) 50 MG tablet Take 1 tablet (50 mg total) by mouth at bedtime.   No facility-administered encounter medications on file as of 07/05/2022.    Recent Results (from the past 2160 hour(s))  Urine Culture     Status: Abnormal   Collection Time: 04/07/22 11:37 AM   Specimen: Urine  Result Value Ref Range   MICRO NUMBER: [id number]    SPECIMEN QUALITY: Adequate    Sample Source URINE, CLEAN CATCH    STATUS: FINAL    ISOLATE 1: Candida lusitaniae (A)     Comment: 10,000-49,000 CFU/mL of Candida lusitaniae Susceptibility testing not routinely performed on this isolate.     Psychiatric Specialty Exam: Physical Exam  Review of Systems  Psychiatric/Behavioral:  Positive for dysphoric mood.     Weight 195 lb (88.5 kg).There is no height or weight on file to calculate BMI.  General Appearance: Casual  Eye Contact:  Fair  Speech:  Slow  Volume:  Decreased  Mood:  Dysphoric  Affect:  Appropriate  Thought Process:  Goal Directed  Orientation:  Full (Time, Place, and Person)  Thought Content:  Rumination  Suicidal Thoughts:  No  Homicidal Thoughts:  No  Memory:  Immediate;   Good Recent;   Good Remote;   Fair  Judgement:  Intact  Insight:  Present  Psychomotor Activity:  Decreased  Concentration:  Concentration: Fair and Attention Span: Fair  Recall:  Fair  Fund of Knowledge:  Good  Language:  Good  Akathisia:  No  Handed:  Right  AIMS (if indicated):     Assets:  Communication Skills Desire for  Improvement Housing Transportation  ADL's:  Intact  Cognition:  WNL  Sleep:  better     Assessment/Plan: Major depressive disorder with single episode, in full remission (HCC) - Plan: sertraline (ZOLOFT) 100 MG tablet, buPROPion (WELLBUTRIN) 75 MG tablet  Uncomplicated alcohol dependence (HCC) - Plan: acamprosate (CAMPRAL) 333 MG tablet  GAD (generalized anxiety disorder) - Plan: sertraline (ZOLOFT) 100 MG tablet, buPROPion (WELLBUTRIN) 75 MG tablet  Chronic post-traumatic stress disorder (PTSD) - Plan: sertraline (ZOLOFT) 100 MG tablet, buPROPion (WELLBUTRIN) 75 MG tablet  Patient doing better with medication adjustment.  She is no longer taking Lexapro.  I recommend discontinue trazodone since she is already feeling tired and taking naps during the day.  She is on Topamax which is helping her headaches.  She also take tizanidine.  Encouraged to keep appointment with the therapist to help her  coping skills and dealing with her PTSD.  She is taking Wellbutrin 75 mg once a day that also helps as she was taking half tablet before.  Continue Campral and that is helping her craving.  Patient like to have her Wellbutrin from our office.  She like to have a 90-day supply of Campral and Wellbutrin as patient going to go strip.  We will provide a 30-day and a refill for Zoloft as there is a possibility dose adjustment we needed on her next appointment.  Discussed safety concern that anytime having active suicidal thoughts or homicidal thought then she need to call 911 or go to local emergency room.  Follow-up in 2 months.   Follow Up Instructions:     I discussed the assessment and treatment plan with the patient. The patient was provided an opportunity to ask questions and all were answered. The patient agreed with the plan and demonstrated an understanding of the instructions.   The patient was advised to call back or seek an in-person evaluation if the symptoms worsen or if the condition fails to  improve as anticipated.    Collaboration of Care: Other provider involved in patient's care AEB notes are available in epic to review.  Patient/Guardian was advised Release of Information must be obtained prior to any record release in order to collaborate their care with an outside provider. Patient/Guardian was advised if they have not already done so to contact the registration department to sign all necessary forms in order for [place] to release information regarding their care.   Consent: Patient/Guardian gives verbal consent for treatment and assignment of benefits for services provided during this visit. Patient/Guardian expressed understanding and agreed to proceed.     I provided 30 minutes of non face to face time during this encounter.  Note: This document was prepared by [employer] voice dictation technology and any errors that results from this process are unintentional.    Cleotis Nipper, MD 07/05/2022

## 2022-07-14 ENCOUNTER — Encounter: Payer: Self-pay | Admitting: Physician Assistant

## 2022-07-15 ENCOUNTER — Emergency Department (HOSPITAL_COMMUNITY): Payer: Medicare Other

## 2022-07-15 ENCOUNTER — Inpatient Hospital Stay (HOSPITAL_COMMUNITY): Payer: Medicare Other

## 2022-07-15 ENCOUNTER — Encounter (HOSPITAL_COMMUNITY): Payer: Self-pay | Admitting: General Surgery

## 2022-07-15 ENCOUNTER — Inpatient Hospital Stay (HOSPITAL_COMMUNITY)
Admission: EM | Admit: 2022-07-15 | Discharge: 2022-07-23 | DRG: 052 | Disposition: E | Payer: Medicare Other | Attending: General Surgery | Admitting: General Surgery

## 2022-07-15 ENCOUNTER — Other Ambulatory Visit: Payer: Self-pay

## 2022-07-15 DIAGNOSIS — W3400XA Accidental discharge from unspecified firearms or gun, initial encounter: Principal | ICD-10-CM

## 2022-07-15 DIAGNOSIS — S14101A Unspecified injury at C1 level of cervical spinal cord, initial encounter: Secondary | ICD-10-CM | POA: Diagnosis present

## 2022-07-15 DIAGNOSIS — F419 Anxiety disorder, unspecified: Secondary | ICD-10-CM | POA: Diagnosis present

## 2022-07-15 DIAGNOSIS — Z7982 Long term (current) use of aspirin: Secondary | ICD-10-CM

## 2022-07-15 DIAGNOSIS — E785 Hyperlipidemia, unspecified: Secondary | ICD-10-CM | POA: Diagnosis present

## 2022-07-15 DIAGNOSIS — Z7984 Long term (current) use of oral hypoglycemic drugs: Secondary | ICD-10-CM | POA: Diagnosis not present

## 2022-07-15 DIAGNOSIS — S5011XA Contusion of right forearm, initial encounter: Secondary | ICD-10-CM | POA: Diagnosis present

## 2022-07-15 DIAGNOSIS — Z96651 Presence of right artificial knee joint: Secondary | ICD-10-CM | POA: Diagnosis present

## 2022-07-15 DIAGNOSIS — F4310 Post-traumatic stress disorder, unspecified: Secondary | ICD-10-CM | POA: Diagnosis present

## 2022-07-15 DIAGNOSIS — J9690 Respiratory failure, unspecified, unspecified whether with hypoxia or hypercapnia: Secondary | ICD-10-CM | POA: Diagnosis present

## 2022-07-15 DIAGNOSIS — Z66 Do not resuscitate: Secondary | ICD-10-CM | POA: Diagnosis present

## 2022-07-15 DIAGNOSIS — S12300A Unspecified displaced fracture of fourth cervical vertebra, initial encounter for closed fracture: Secondary | ICD-10-CM | POA: Diagnosis present

## 2022-07-15 DIAGNOSIS — Y92410 Unspecified street and highway as the place of occurrence of the external cause: Secondary | ICD-10-CM

## 2022-07-15 DIAGNOSIS — F32A Depression, unspecified: Secondary | ICD-10-CM | POA: Diagnosis present

## 2022-07-15 DIAGNOSIS — S14114A Complete lesion at C4 level of cervical spinal cord, initial encounter: Principal | ICD-10-CM | POA: Diagnosis present

## 2022-07-15 DIAGNOSIS — R001 Bradycardia, unspecified: Secondary | ICD-10-CM | POA: Diagnosis present

## 2022-07-15 DIAGNOSIS — E039 Hypothyroidism, unspecified: Secondary | ICD-10-CM | POA: Diagnosis present

## 2022-07-15 DIAGNOSIS — K219 Gastro-esophageal reflux disease without esophagitis: Secondary | ICD-10-CM | POA: Diagnosis present

## 2022-07-15 DIAGNOSIS — I252 Old myocardial infarction: Secondary | ICD-10-CM | POA: Diagnosis not present

## 2022-07-15 DIAGNOSIS — T794XXA Traumatic shock, initial encounter: Secondary | ICD-10-CM | POA: Diagnosis present

## 2022-07-15 DIAGNOSIS — F1011 Alcohol abuse, in remission: Secondary | ICD-10-CM | POA: Diagnosis present

## 2022-07-15 DIAGNOSIS — Z515 Encounter for palliative care: Secondary | ICD-10-CM | POA: Diagnosis not present

## 2022-07-15 DIAGNOSIS — Y249XXA Unspecified firearm discharge, undetermined intent, initial encounter: Secondary | ICD-10-CM

## 2022-07-15 DIAGNOSIS — K7460 Unspecified cirrhosis of liver: Secondary | ICD-10-CM | POA: Diagnosis present

## 2022-07-15 DIAGNOSIS — E1159 Type 2 diabetes mellitus with other circulatory complications: Secondary | ICD-10-CM | POA: Diagnosis not present

## 2022-07-15 DIAGNOSIS — E119 Type 2 diabetes mellitus without complications: Secondary | ICD-10-CM | POA: Diagnosis present

## 2022-07-15 DIAGNOSIS — G8251 Quadriplegia, C1-C4 complete: Secondary | ICD-10-CM | POA: Diagnosis present

## 2022-07-15 DIAGNOSIS — Z79899 Other long term (current) drug therapy: Secondary | ICD-10-CM | POA: Diagnosis not present

## 2022-07-15 DIAGNOSIS — I10 Essential (primary) hypertension: Secondary | ICD-10-CM | POA: Diagnosis present

## 2022-07-15 DIAGNOSIS — I5030 Unspecified diastolic (congestive) heart failure: Secondary | ICD-10-CM | POA: Diagnosis not present

## 2022-07-15 LAB — CBC
HCT: 36.1 % (ref 36.0–46.0)
Hemoglobin: 12.8 g/dL (ref 12.0–15.0)
MCH: 33.2 pg (ref 26.0–34.0)
MCHC: 35.5 g/dL (ref 30.0–36.0)
MCV: 93.8 fL (ref 80.0–100.0)
Platelets: 222 10*3/uL (ref 150–400)
RBC: 3.85 MIL/uL — ABNORMAL LOW (ref 3.87–5.11)
RDW: 14.3 % (ref 11.5–15.5)
WBC: 5.9 10*3/uL (ref 4.0–10.5)
nRBC: 0 % (ref 0.0–0.2)

## 2022-07-15 LAB — COMPREHENSIVE METABOLIC PANEL
ALT: 25 U/L (ref 0–44)
AST: 48 U/L — ABNORMAL HIGH (ref 15–41)
Albumin: 3 g/dL — ABNORMAL LOW (ref 3.5–5.0)
Alkaline Phosphatase: 40 U/L (ref 38–126)
Anion gap: 9 (ref 5–15)
BUN: 13 mg/dL (ref 8–23)
CO2: 19 mmol/L — ABNORMAL LOW (ref 22–32)
Calcium: 9.7 mg/dL (ref 8.9–10.3)
Chloride: 107 mmol/L (ref 98–111)
Creatinine, Ser: 1.22 mg/dL — ABNORMAL HIGH (ref 0.44–1.00)
GFR, Estimated: 49 mL/min — ABNORMAL LOW (ref >60)
Glucose, Bld: 142 mg/dL — ABNORMAL HIGH (ref 70–99)
Potassium: 3.7 mmol/L (ref 3.5–5.1)
Sodium: 135 mmol/L (ref 135–145)
Total Bilirubin: 0.9 mg/dL (ref 0.3–1.2)
Total Protein: 6 g/dL — ABNORMAL LOW (ref 6.5–8.1)

## 2022-07-15 LAB — GLUCOSE, CAPILLARY
Glucose-Capillary: 156 mg/dL — ABNORMAL HIGH (ref 70–99)
Glucose-Capillary: 150 mg/dL — ABNORMAL HIGH (ref 70–99)

## 2022-07-15 LAB — URINALYSIS, ROUTINE W REFLEX MICROSCOPIC
Bilirubin Urine: NEGATIVE
Glucose, UA: >=500 mg/dL — AB
Hgb urine dipstick: NEGATIVE
Ketones, ur: 5 mg/dL — AB
Leukocytes,Ua: NEGATIVE
Nitrite: NEGATIVE
Protein, ur: NEGATIVE mg/dL
Specific Gravity, Urine: 1.04 — ABNORMAL HIGH (ref 1.005–1.030)
pH: 5 (ref 5.0–8.0)

## 2022-07-15 LAB — I-STAT CHEM 8, ED
BUN: 14 mg/dL (ref 8–23)
Calcium, Ion: 1.27 mmol/L (ref 1.15–1.40)
Chloride: 107 mmol/L (ref 98–111)
Creatinine, Ser: 1.1 mg/dL — ABNORMAL HIGH (ref 0.44–1.00)
Glucose, Bld: 138 mg/dL — ABNORMAL HIGH (ref 70–99)
HCT: 34 % — ABNORMAL LOW (ref 36.0–46.0)
Hemoglobin: 11.6 g/dL — ABNORMAL LOW (ref 12.0–15.0)
Potassium: 3.8 mmol/L (ref 3.5–5.1)
Sodium: 138 mmol/L (ref 135–145)
TCO2: 20 mmol/L — ABNORMAL LOW (ref 22–32)

## 2022-07-15 LAB — SAMPLE TO BLOOD BANK

## 2022-07-15 LAB — LACTIC ACID, PLASMA: Lactic Acid, Venous: 1.9 mmol/L (ref 0.5–1.9)

## 2022-07-15 LAB — MRSA NEXT GEN BY PCR, NASAL: MRSA by PCR Next Gen: NOT DETECTED

## 2022-07-15 LAB — PROTIME-INR
INR: 1.2 (ref 0.8–1.2)
Prothrombin Time: 15 seconds (ref 11.4–15.2)

## 2022-07-15 LAB — ETHANOL: Alcohol, Ethyl (B): <10 mg/dL (ref <10)

## 2022-07-15 MED ORDER — FENTANYL CITRATE PF 50 MCG/ML IJ SOSY
25.0000 ug | PREFILLED_SYRINGE | Freq: Once | INTRAMUSCULAR | Status: AC
  Administered 2022-07-15: 25 ug via INTRAVENOUS
  Filled 2022-07-15: qty 1

## 2022-07-15 MED ORDER — ORAL CARE MOUTH RINSE
15.0000 mL | OROMUCOSAL | Status: DC
  Administered 2022-07-15: 15 mL via OROMUCOSAL

## 2022-07-15 MED ORDER — MORPHINE SULFATE (PF) 2 MG/ML IV SOLN
2.0000 mg | INTRAVENOUS | Status: DC | PRN
  Administered 2022-07-15 – 2022-07-17 (×3): 2 mg via INTRAVENOUS
  Filled 2022-07-15 (×4): qty 1

## 2022-07-15 MED ORDER — SODIUM CHLORIDE 0.9 % IV SOLN
250.0000 mL | INTRAVENOUS | Status: DC
  Administered 2022-07-15: 250 mL via INTRAVENOUS

## 2022-07-15 MED ORDER — IOHEXOL 350 MG/ML SOLN
75.0000 mL | Freq: Once | INTRAVENOUS | Status: AC | PRN
  Administered 2022-07-15: 75 mL via INTRAVENOUS

## 2022-07-15 MED ORDER — SODIUM CHLORIDE 0.9 % IV BOLUS
1000.0000 mL | Freq: Once | INTRAVENOUS | Status: AC
  Administered 2022-07-15: 1000 mL via INTRAVENOUS

## 2022-07-15 MED ORDER — ALBUMIN HUMAN 5 % IV SOLN
12.5000 g | Freq: Once | INTRAVENOUS | Status: AC
  Administered 2022-07-15: 12.5 g via INTRAVENOUS
  Filled 2022-07-15: qty 250

## 2022-07-15 MED ORDER — ACETAMINOPHEN 500 MG PO TABS
1000.0000 mg | ORAL_TABLET | Freq: Four times a day (QID) | ORAL | Status: DC
  Administered 2022-07-15 – 2022-07-17 (×8): 1000 mg
  Filled 2022-07-15 (×8): qty 2

## 2022-07-15 MED ORDER — METOPROLOL TARTRATE 5 MG/5ML IV SOLN: 5.0000 mg | Freq: Four times a day (QID) | INTRAVENOUS | Status: DC | PRN

## 2022-07-15 MED ORDER — PANTOPRAZOLE SODIUM 40 MG PO TBEC: 40.0000 mg | DELAYED_RELEASE_TABLET | Freq: Every day | ORAL | Status: DC

## 2022-07-15 MED ORDER — ACETAMINOPHEN 500 MG PO TABS: 1000.0000 mg | ORAL_TABLET | Freq: Four times a day (QID) | ORAL | Status: DC

## 2022-07-15 MED ORDER — POLYETHYLENE GLYCOL 3350 17 G PO PACK: 17.0000 g | PACK | Freq: Every day | ORAL | Status: DC | PRN

## 2022-07-15 MED ORDER — FENTANYL CITRATE PF 50 MCG/ML IJ SOSY
50.0000 ug | PREFILLED_SYRINGE | INTRAMUSCULAR | Status: DC | PRN
  Administered 2022-07-15: 50 ug via INTRAVENOUS
  Filled 2022-07-15: qty 1

## 2022-07-15 MED ORDER — INSULIN ASPART 100 UNIT/ML IJ SOLN
0.0000 [IU] | Freq: Three times a day (TID) | INTRAMUSCULAR | Status: DC
  Administered 2022-07-16: 2 [IU] via SUBCUTANEOUS

## 2022-07-15 MED ORDER — METHOCARBAMOL 1000 MG/10ML IJ SOLN
500.0000 mg | Freq: Three times a day (TID) | INTRAVENOUS | Status: DC
  Administered 2022-07-15: 500 mg via INTRAVENOUS
  Filled 2022-07-15 (×2): qty 5

## 2022-07-15 MED ORDER — FENTANYL CITRATE PF 50 MCG/ML IJ SOSY
50.0000 ug | PREFILLED_SYRINGE | INTRAMUSCULAR | Status: DC | PRN
  Administered 2022-07-15: 100 ug via INTRAVENOUS
  Administered 2022-07-15: 50 ug via INTRAVENOUS
  Administered 2022-07-15 (×3): 100 ug via INTRAVENOUS
  Administered 2022-07-15 – 2022-07-16 (×2): 50 ug via INTRAVENOUS
  Administered 2022-07-16: 100 ug via INTRAVENOUS
  Administered 2022-07-16: 50 ug via INTRAVENOUS
  Administered 2022-07-16: 100 ug via INTRAVENOUS
  Administered 2022-07-16: 50 ug via INTRAVENOUS
  Administered 2022-07-16: 100 ug via INTRAVENOUS
  Filled 2022-07-15 (×3): qty 2
  Filled 2022-07-15 (×2): qty 1
  Filled 2022-07-15 (×3): qty 2
  Filled 2022-07-15 (×3): qty 1
  Filled 2022-07-15: qty 2

## 2022-07-15 MED ORDER — LEVOTHYROXINE SODIUM 100 MCG/5ML IV SOLN: 37.5000 ug | Freq: Every day | INTRAVENOUS | Status: DC

## 2022-07-15 MED ORDER — DOCUSATE SODIUM 100 MG PO CAPS: 100.0000 mg | ORAL_CAPSULE | Freq: Two times a day (BID) | ORAL | Status: DC

## 2022-07-15 MED ORDER — NOREPINEPHRINE 4 MG/250ML-% IV SOLN: 0.0000 ug/min | INTRAVENOUS | Status: DC

## 2022-07-15 MED ORDER — HYDRALAZINE HCL 20 MG/ML IJ SOLN: 10.0000 mg | INTRAMUSCULAR | Status: DC | PRN

## 2022-07-15 MED ORDER — LACTATED RINGERS IV SOLN: INTRAVENOUS | Status: DC

## 2022-07-15 MED ORDER — DOCUSATE SODIUM 50 MG/5ML PO LIQD
100.0000 mg | Freq: Two times a day (BID) | ORAL | Status: DC
  Administered 2022-07-16 (×3): 100 mg
  Filled 2022-07-15 (×3): qty 10

## 2022-07-15 MED ORDER — NOREPINEPHRINE 4 MG/250ML-% IV SOLN
2.0000 ug/min | INTRAVENOUS | Status: DC
  Administered 2022-07-15 – 2022-07-16 (×2): 2 ug/min via INTRAVENOUS
  Filled 2022-07-15: qty 250

## 2022-07-15 MED ORDER — NOREPINEPHRINE 4 MG/250ML-% IV SOLN
0.0000 ug/min | INTRAVENOUS | Status: DC
  Administered 2022-07-15: 5 ug/min via INTRAVENOUS

## 2022-07-15 MED ORDER — ORAL CARE MOUTH RINSE: 15.0000 mL | OROMUCOSAL | Status: DC | PRN

## 2022-07-15 MED ORDER — ONDANSETRON 4 MG PO TBDP: 4.0000 mg | ORAL_TABLET | Freq: Four times a day (QID) | ORAL | Status: DC | PRN

## 2022-07-15 MED ORDER — ORAL CARE MOUTH RINSE: 15.0000 mL | OROMUCOSAL | Status: DC

## 2022-07-15 MED ORDER — METHOCARBAMOL 500 MG PO TABS
500.0000 mg | ORAL_TABLET | Freq: Three times a day (TID) | ORAL | Status: DC
  Administered 2022-07-15 – 2022-07-17 (×5): 500 mg via ORAL
  Filled 2022-07-15 (×6): qty 1

## 2022-07-15 MED ORDER — MORPHINE SULFATE (PF) 4 MG/ML IV SOLN
4.0000 mg | INTRAVENOUS | Status: DC | PRN
  Administered 2022-07-15 – 2022-07-17 (×14): 4 mg via INTRAVENOUS
  Filled 2022-07-15 (×14): qty 1

## 2022-07-15 MED ORDER — ONDANSETRON HCL 4 MG/2ML IJ SOLN
4.0000 mg | Freq: Four times a day (QID) | INTRAMUSCULAR | Status: DC | PRN
  Administered 2022-07-15: 4 mg via INTRAVENOUS
  Filled 2022-07-15 (×2): qty 2

## 2022-07-15 MED ORDER — OXYCODONE HCL 5 MG PO TABS
5.0000 mg | ORAL_TABLET | ORAL | Status: DC | PRN
  Administered 2022-07-15: 10 mg
  Administered 2022-07-15: 5 mg
  Administered 2022-07-15 – 2022-07-16 (×2): 10 mg
  Filled 2022-07-15 (×2): qty 2
  Filled 2022-07-15: qty 1
  Filled 2022-07-15: qty 2

## 2022-07-15 MED ORDER — PANTOPRAZOLE SODIUM 40 MG IV SOLR
40.0000 mg | Freq: Every day | INTRAVENOUS | Status: DC
  Administered 2022-07-16: 40 mg via INTRAVENOUS
  Filled 2022-07-15: qty 10
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
## 2022-07-15 NOTE — Procedures (Signed)
Cortrak  Person Inserting Tube:  Mahala Menghini, RD Tube Type:  Cortrak - 43 inches Tube Size:  10 Tube Location:  Left nare Secured by: Bridle Technique Used to Measure Tube Placement:  Marking at nare/corner of mouth Cortrak Secured At:  66 cm   Cortrak Tube Team Note:  Consult received to place a Cortrak feeding tube.   X-ray is required, abdominal x-ray has been ordered by the Cortrak team. Please confirm tube placement before using the Cortrak tube.   If the tube becomes dislodged please keep the tube and contact the Cortrak team at [email or web address] for replacement.  If after hours and replacement cannot be delayed, place a NG tube and confirm placement with an abdominal x-ray.    Mertie Clause, MS, RD, LDN Inpatient Clinical Dietitian Please see [facility] for contact information.
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
## 2022-07-15 NOTE — Progress Notes (Signed)
Orthopedic Tech Progress Note Patient Details:  [name] [name] 11-26-1955 [id number]  Patient ID: [name] [name], female   DOB: [birth year], 67 y.o.   MRN: [id number] Level I; not needed. [name] [name] 07/21/2022, 12:41 PM
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
## 2022-07-15 NOTE — Progress Notes (Signed)
Iv was obtained by IV team via ultrasound for pressors.  IV watch was placed and levophed was moved to that IV.   Dr. Janee Morn messaged me and alerted me that patient could be sat up because she was cleared.
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
## 2022-07-15 NOTE — Progress Notes (Signed)
Patient ID: [name] [name], female   DOB: 1956-01-26, 67 y.o.   MRN: [id number] I met with her husband and daughter again at the bedside to have further goals of care discussions.  We have placed a cortrak for medication administration.  They would like to honor her advance directives which they have documented and request that we make her DNR and DNI at this time.  We will otherwise continue treatments with medications.  I answered her other questions and expressed appreciation for honoring her wishes.  Violeta Gelinas, MD, MPH, FACS Please use [email or web address] to contact on call provider

## 2022-07-16 ENCOUNTER — Inpatient Hospital Stay (HOSPITAL_COMMUNITY): Payer: Medicare Other

## 2022-07-16 LAB — GLUCOSE, CAPILLARY
Glucose-Capillary: 130 mg/dL — ABNORMAL HIGH (ref 70–99)
Glucose-Capillary: 117 mg/dL — ABNORMAL HIGH (ref 70–99)
Glucose-Capillary: 115 mg/dL — ABNORMAL HIGH (ref 70–99)
Glucose-Capillary: 111 mg/dL — ABNORMAL HIGH (ref 70–99)
Glucose-Capillary: 124 mg/dL — ABNORMAL HIGH (ref 70–99)

## 2022-07-16 LAB — CBC
HCT: 35.8 % — ABNORMAL LOW (ref 36.0–46.0)
Hemoglobin: 12.1 g/dL (ref 12.0–15.0)
MCH: 33.1 pg (ref 26.0–34.0)
MCHC: 33.8 g/dL (ref 30.0–36.0)
MCV: 97.8 fL (ref 80.0–100.0)
Platelets: 213 10*3/uL (ref 150–400)
RBC: 3.66 MIL/uL — ABNORMAL LOW (ref 3.87–5.11)
RDW: 14.4 % (ref 11.5–15.5)
WBC: 10.1 10*3/uL (ref 4.0–10.5)
nRBC: 0 % (ref 0.0–0.2)

## 2022-07-16 LAB — BASIC METABOLIC PANEL
Anion gap: 14 (ref 5–15)
BUN: 17 mg/dL (ref 8–23)
CO2: 17 mmol/L — ABNORMAL LOW (ref 22–32)
Calcium: 9.4 mg/dL (ref 8.9–10.3)
Chloride: 106 mmol/L (ref 98–111)
Creatinine, Ser: 1.08 mg/dL — ABNORMAL HIGH (ref 0.44–1.00)
GFR, Estimated: 56 mL/min — ABNORMAL LOW (ref >60)
Glucose, Bld: 154 mg/dL — ABNORMAL HIGH (ref 70–99)
Potassium: 4.6 mmol/L (ref 3.5–5.1)
Sodium: 137 mmol/L (ref 135–145)

## 2022-07-16 LAB — HEMOGLOBIN A1C
Hgb A1c MFr Bld: 6.4 % — ABNORMAL HIGH (ref 4.8–5.6)
Mean Plasma Glucose: 137 mg/dL

## 2022-07-16 MED ORDER — OXYCODONE HCL 5 MG PO TABS
10.0000 mg | ORAL_TABLET | ORAL | Status: DC | PRN
  Administered 2022-07-16 (×2): 15 mg
  Administered 2022-07-16: 10 mg
  Filled 2022-07-16: qty 3
  Filled 2022-07-16: qty 2
  Filled 2022-07-16: qty 3

## 2022-07-16 MED ORDER — HYDROMORPHONE HCL 1 MG/ML IJ SOLN
1.0000 mg | INTRAMUSCULAR | Status: DC | PRN
  Administered 2022-07-16 (×2): 1 mg via INTRAVENOUS
  Filled 2022-07-16 (×2): qty 1

## 2022-07-16 MED ORDER — ALBUMIN HUMAN 5 % IV SOLN
12.5000 g | Freq: Once | INTRAVENOUS | Status: AC
  Administered 2022-07-16: 12.5 g via INTRAVENOUS
  Filled 2022-07-16: qty 250

## 2022-07-16 MED ORDER — GABAPENTIN 250 MG/5ML PO SOLN
300.0000 mg | Freq: Three times a day (TID) | ORAL | Status: DC
  Administered 2022-07-16 – 2022-07-17 (×4): 300 mg
  Filled 2022-07-16 (×7): qty 6

## 2022-07-16 MED ORDER — CHLORHEXIDINE GLUCONATE CLOTH 2 % EX PADS
6.0000 | MEDICATED_PAD | Freq: Every day | CUTANEOUS | Status: DC
  Administered 2022-07-16: 6 via TOPICAL
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
## 2022-07-16 NOTE — TOC CAGE-AID Note (Signed)
Transition of Care [facility]) - CAGE-AID Screening   Patient Details  Name: [name] [name] MRN: [id number] Date of Birth: 26-Feb-1955   Judie Bonus, RN Phone Number: 07/16/2022, 4:46 AM   Clinical Narrative:  No current tobacco or etoh use. Occasional therapeutic THC usage. No resources needed.   CAGE-AID Screening:    Have You Ever Felt You Ought to Cut Down on Your Drinking or Drug Use?: No Have People Annoyed You By Critizing Your Drinking Or Drug Use?: No Have You Felt Bad Or Guilty About Your Drinking Or Drug Use?:  (sober since 2016) Have You Ever Had a Drink or Used Drugs First Thing In The Morning to Steady Your Nerves or to Get Rid of a Hangover?: No    Substance Abuse Education Offered: No

## 2022-07-16 NOTE — Progress Notes (Signed)
Sent jewelry home with Daughter: 1 set of earrings, 5 rings, 1 bracelet and 1 nose ring. No other valuables at the bedside.

## 2022-07-17 ENCOUNTER — Inpatient Hospital Stay (HOSPITAL_COMMUNITY): Payer: Medicare Other

## 2022-07-17 DIAGNOSIS — E1159 Type 2 diabetes mellitus with other circulatory complications: Secondary | ICD-10-CM | POA: Diagnosis not present

## 2022-07-17 DIAGNOSIS — I5030 Unspecified diastolic (congestive) heart failure: Secondary | ICD-10-CM | POA: Diagnosis not present

## 2022-07-17 DIAGNOSIS — Z7984 Long term (current) use of oral hypoglycemic drugs: Secondary | ICD-10-CM

## 2022-07-17 LAB — BASIC METABOLIC PANEL
Anion gap: 7 (ref 5–15)
BUN: 17 mg/dL (ref 8–23)
CO2: 21 mmol/L — ABNORMAL LOW (ref 22–32)
Calcium: 8.5 mg/dL — ABNORMAL LOW (ref 8.9–10.3)
Chloride: 105 mmol/L (ref 98–111)
Creatinine, Ser: 1.2 mg/dL — ABNORMAL HIGH (ref 0.44–1.00)
GFR, Estimated: 50 mL/min — ABNORMAL LOW (ref >60)
Glucose, Bld: 126 mg/dL — ABNORMAL HIGH (ref 70–99)
Potassium: 4.2 mmol/L (ref 3.5–5.1)
Sodium: 133 mmol/L — ABNORMAL LOW (ref 135–145)

## 2022-07-17 LAB — CBC
HCT: 32 % — ABNORMAL LOW (ref 36.0–46.0)
Hemoglobin: 10.8 g/dL — ABNORMAL LOW (ref 12.0–15.0)
MCH: 33.2 pg (ref 26.0–34.0)
MCHC: 33.8 g/dL (ref 30.0–36.0)
MCV: 98.5 fL (ref 80.0–100.0)
Platelets: 199 10*3/uL (ref 150–400)
RBC: 3.25 MIL/uL — ABNORMAL LOW (ref 3.87–5.11)
RDW: 14.6 % (ref 11.5–15.5)
WBC: 9.2 10*3/uL (ref 4.0–10.5)
nRBC: 0 % (ref 0.0–0.2)

## 2022-07-17 LAB — GLUCOSE, CAPILLARY
Glucose-Capillary: 146 mg/dL — ABNORMAL HIGH (ref 70–99)
Glucose-Capillary: 134 mg/dL — ABNORMAL HIGH (ref 70–99)
Glucose-Capillary: 131 mg/dL — ABNORMAL HIGH (ref 70–99)

## 2022-07-17 MED ORDER — LEVOTHYROXINE SODIUM 50 MCG PO TABS: 50.0000 ug | ORAL_TABLET | Freq: Every day | ORAL | Status: DC
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
## 2022-07-17 NOTE — Progress Notes (Signed)
Levophed stopped
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
## 2022-07-17 NOTE — Progress Notes (Signed)
Trauma Event Note   Notified by primary RN of pt's condition- resp slow, monitor change with heart rhythm with occasional pauses. Sats are now 60's, consistently, resp at 6/minute-- family aware.     Trending Coag's Recent Labs    07/03/2022 1305  INR 1.2    Trending BMET Recent Labs    06/25/2022 1155 07/19/2022 1209 07/16/22 0348 07/16/2022 0325  NA 135 138 137 133*  K 3.7 3.8 4.6 4.2  CL 107 107 106 105  CO2 19*  --  17* 21*  BUN 13 14 17 17   CREATININE 1.22* 1.10* 1.08* 1.20*  GLUCOSE 142* 138* 154* 126*      [name] [name] M Laurieanne Galloway  Trauma Response RN  Please call TRN at [phone] for further assistance.
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
## 2022-07-17 NOTE — Progress Notes (Signed)
Trauma Event Note   Pt's familuy at bedside,   Sats continue to be in the low 70s - Dr. Sheliah Hatch made aware. Primary RN giving Morphine q 1 Hr--  Pt lungs diminished, no crackles/wheezes audible.   Last imported Vital Signs BP (!) 102/45   Pulse 92   Temp 98.7 F (37.1 C) (Oral)   Resp 17   SpO2 (!) 84%   Trending CBC Recent Labs    07/19/2022 1155 06/24/2022 1209 07/16/22 0348 06/25/2022 0325  WBC 5.9  --  10.1 9.2  HGB 12.8 11.6* 12.1 10.8*  HCT 36.1 34.0* 35.8* 32.0*  PLT 222  --  213 199    Trending Coag's Recent Labs    06/25/2022 1305  INR 1.2    Trending BMET Recent Labs    06/24/2022 1155 07/11/2022 1209 07/16/22 0348 07/03/2022 0325  NA 135 138 137 133*  K 3.7 3.8 4.6 4.2  CL 107 107 106 105  CO2 19*  --  17* 21*  BUN 13 14 17 17   CREATININE 1.22* 1.10* 1.08* 1.20*  GLUCOSE 142* 138* 154* 126*      Nichlas Pitera M Siarra Gilkerson  Trauma Response RN  Please call TRN at [phone] for further assistance.

## 2022-07-19 ENCOUNTER — Encounter (HOSPITAL_COMMUNITY): Payer: Self-pay | Admitting: Psychiatry

## 2022-07-23 DEATH — deceased

## 2022-07-29 ENCOUNTER — Ambulatory Visit: Payer: Medicare Other | Admitting: Family Medicine
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
## 2022-08-29 ENCOUNTER — Telehealth (HOSPITAL_COMMUNITY): Payer: Medicare Other | Admitting: Psychiatry

## 2022-09-05 ENCOUNTER — Telehealth (HOSPITAL_COMMUNITY): Payer: Medicare Other | Admitting: Psychiatry

## 2022-09-22 ENCOUNTER — Telehealth: Payer: Medicare Other

## 2022-09-29 ENCOUNTER — Ambulatory Visit: Payer: Medicare Other | Admitting: Physician Assistant

## 2022-12-08 ENCOUNTER — Ambulatory Visit: Payer: Medicare Other | Admitting: Internal Medicine

## 2022-12-12 ENCOUNTER — Ambulatory Visit: Payer: Medicare Other | Admitting: Neurology
